# Patient Record
Sex: Female | Born: 1955
Health system: Southern US, Community
[De-identification: ages and names within clinical notes are randomized; demographics above are authoritative.]

## PROBLEM LIST (undated history)

## (undated) DIAGNOSIS — I10 Essential (primary) hypertension: Secondary | ICD-10-CM

## (undated) DIAGNOSIS — F410 Panic disorder [episodic paroxysmal anxiety] without agoraphobia: Secondary | ICD-10-CM

## (undated) DIAGNOSIS — F329 Major depressive disorder, single episode, unspecified: Secondary | ICD-10-CM

## (undated) DIAGNOSIS — I509 Heart failure, unspecified: Secondary | ICD-10-CM

## (undated) DIAGNOSIS — K219 Gastro-esophageal reflux disease without esophagitis: Secondary | ICD-10-CM

## (undated) DIAGNOSIS — T4145XA Adverse effect of unspecified anesthetic, initial encounter: Secondary | ICD-10-CM

## (undated) DIAGNOSIS — E119 Type 2 diabetes mellitus without complications: Secondary | ICD-10-CM

## (undated) DIAGNOSIS — R569 Unspecified convulsions: Secondary | ICD-10-CM

## (undated) DIAGNOSIS — E7800 Pure hypercholesterolemia, unspecified: Secondary | ICD-10-CM

## (undated) DIAGNOSIS — Z9289 Personal history of other medical treatment: Secondary | ICD-10-CM

## (undated) DIAGNOSIS — F419 Anxiety disorder, unspecified: Secondary | ICD-10-CM

## (undated) DIAGNOSIS — I2510 Atherosclerotic heart disease of native coronary artery without angina pectoris: Secondary | ICD-10-CM

## (undated) DIAGNOSIS — D649 Anemia, unspecified: Secondary | ICD-10-CM

## (undated) DIAGNOSIS — I739 Peripheral vascular disease, unspecified: Secondary | ICD-10-CM

## (undated) DIAGNOSIS — G629 Polyneuropathy, unspecified: Secondary | ICD-10-CM

## (undated) DIAGNOSIS — T8859XA Other complications of anesthesia, initial encounter: Secondary | ICD-10-CM

## (undated) DIAGNOSIS — F411 Generalized anxiety disorder: Secondary | ICD-10-CM

## (undated) DIAGNOSIS — M1990 Unspecified osteoarthritis, unspecified site: Secondary | ICD-10-CM

## (undated) HISTORY — DX: Peripheral vascular disease, unspecified: I73.9

## (undated) HISTORY — DX: Panic disorder (episodic paroxysmal anxiety): F41.1

## (undated) HISTORY — DX: Atherosclerotic heart disease of native coronary artery without angina pectoris: I25.10

## (undated) HISTORY — DX: Panic disorder (episodic paroxysmal anxiety): F41.0

## (undated) HISTORY — PX: COLONOSCOPY: SHX174

## (undated) HISTORY — PX: TONSILLECTOMY: SUR1361

## (undated) HISTORY — PX: CAROTID ENDARTERECTOMY: SUR193

## (undated) HISTORY — PX: TOE AMPUTATION: SHX809

## (undated) HISTORY — PX: CHOLECYSTECTOMY: SHX55

## (undated) HISTORY — PX: DILATION AND CURETTAGE OF UTERUS: SHX78

## (undated) MED FILL — Ferumoxytol Inj 510 MG/17ML (30 MG/ML) (Elemental Fe): INTRAVENOUS | Qty: 17 | Status: AC

---

## 2001-12-21 ENCOUNTER — Other Ambulatory Visit: Admission: RE | Admit: 2001-12-21 | Discharge: 2001-12-21 | Payer: Self-pay | Admitting: Obstetrics and Gynecology

## 2002-04-05 ENCOUNTER — Encounter: Payer: Self-pay | Admitting: Orthopedic Surgery

## 2002-04-05 ENCOUNTER — Inpatient Hospital Stay (HOSPITAL_COMMUNITY): Admission: EM | Admit: 2002-04-05 | Discharge: 2002-04-09 | Payer: Self-pay | Admitting: Emergency Medicine

## 2003-04-23 ENCOUNTER — Emergency Department (HOSPITAL_COMMUNITY): Admission: EM | Admit: 2003-04-23 | Discharge: 2003-04-23 | Payer: Self-pay | Admitting: Emergency Medicine

## 2003-08-25 ENCOUNTER — Emergency Department (HOSPITAL_COMMUNITY): Admission: EM | Admit: 2003-08-25 | Discharge: 2003-08-25 | Payer: Self-pay | Admitting: Podiatry

## 2003-09-13 ENCOUNTER — Emergency Department (HOSPITAL_COMMUNITY): Admission: EM | Admit: 2003-09-13 | Discharge: 2003-09-13 | Payer: Self-pay | Admitting: Family Medicine

## 2005-02-26 ENCOUNTER — Ambulatory Visit (HOSPITAL_COMMUNITY): Admission: RE | Admit: 2005-02-26 | Discharge: 2005-02-26 | Payer: Self-pay | Admitting: Orthopedic Surgery

## 2006-03-17 HISTORY — PX: BELOW KNEE LEG AMPUTATION: SUR23

## 2006-03-30 ENCOUNTER — Encounter: Payer: Self-pay | Admitting: Vascular Surgery

## 2006-03-30 ENCOUNTER — Ambulatory Visit: Payer: Self-pay | Admitting: Cardiology

## 2006-03-30 ENCOUNTER — Inpatient Hospital Stay (HOSPITAL_COMMUNITY): Admission: EM | Admit: 2006-03-30 | Discharge: 2006-04-07 | Payer: Self-pay | Admitting: Emergency Medicine

## 2006-03-31 ENCOUNTER — Encounter (INDEPENDENT_AMBULATORY_CARE_PROVIDER_SITE_OTHER): Payer: Self-pay | Admitting: Cardiology

## 2006-04-01 ENCOUNTER — Encounter (INDEPENDENT_AMBULATORY_CARE_PROVIDER_SITE_OTHER): Payer: Self-pay | Admitting: *Deleted

## 2006-04-03 ENCOUNTER — Ambulatory Visit: Payer: Self-pay | Admitting: Infectious Diseases

## 2006-04-03 ENCOUNTER — Ambulatory Visit: Payer: Self-pay | Admitting: Physical Medicine & Rehabilitation

## 2006-04-06 ENCOUNTER — Encounter: Payer: Self-pay | Admitting: Internal Medicine

## 2006-04-07 ENCOUNTER — Inpatient Hospital Stay (HOSPITAL_COMMUNITY)
Admission: RE | Admit: 2006-04-07 | Discharge: 2006-04-15 | Payer: Self-pay | Admitting: Physical Medicine & Rehabilitation

## 2006-05-01 ENCOUNTER — Ambulatory Visit: Payer: Self-pay | Admitting: Cardiovascular Disease

## 2006-05-01 LAB — CONVERTED CEMR LAB
Basophils Relative: 0.6 % (ref 0.0–1.0)
Creatinine, Ser: 0.9 mg/dL (ref 0.4–1.2)
Eosinophils Absolute: 0.7 10*3/uL — ABNORMAL HIGH (ref 0.0–0.6)
Eosinophils Relative: 9.5 % — ABNORMAL HIGH (ref 0.0–5.0)
GFR calc Af Amer: 86 mL/min
GFR calc non Af Amer: 71 mL/min
HCT: 34 % — ABNORMAL LOW (ref 36.0–46.0)
Hemoglobin: 11.6 g/dL — ABNORMAL LOW (ref 12.0–15.0)
Lymphocytes Relative: 25 % (ref 12.0–46.0)
MCHC: 34 g/dL (ref 30.0–36.0)
Monocytes Absolute: 0.5 10*3/uL (ref 0.2–0.7)
Monocytes Relative: 6.3 % (ref 3.0–11.0)
Neutrophils Relative %: 58.6 % (ref 43.0–77.0)
Sodium: 139 meq/L (ref 135–145)

## 2006-05-11 ENCOUNTER — Ambulatory Visit: Payer: Self-pay

## 2006-05-14 ENCOUNTER — Ambulatory Visit: Payer: Self-pay | Admitting: Cardiovascular Disease

## 2006-05-19 ENCOUNTER — Encounter
Admission: RE | Admit: 2006-05-19 | Discharge: 2006-08-17 | Payer: Self-pay | Admitting: Physical Medicine & Rehabilitation

## 2006-05-19 ENCOUNTER — Ambulatory Visit: Payer: Self-pay | Admitting: Physical Medicine & Rehabilitation

## 2006-05-20 ENCOUNTER — Encounter
Admission: RE | Admit: 2006-05-20 | Discharge: 2006-08-18 | Payer: Self-pay | Admitting: Physical Medicine & Rehabilitation

## 2006-06-24 ENCOUNTER — Ambulatory Visit: Payer: Self-pay | Admitting: Cardiovascular Disease

## 2006-08-18 ENCOUNTER — Encounter: Admission: RE | Admit: 2006-08-18 | Discharge: 2006-11-16 | Payer: Self-pay | Admitting: Orthopedic Surgery

## 2006-11-12 ENCOUNTER — Encounter: Admission: RE | Admit: 2006-11-12 | Discharge: 2006-11-12 | Payer: Self-pay | Admitting: Family Medicine

## 2007-01-07 ENCOUNTER — Encounter
Admission: RE | Admit: 2007-01-07 | Discharge: 2007-01-08 | Payer: Self-pay | Admitting: Physical Medicine & Rehabilitation

## 2007-01-07 ENCOUNTER — Ambulatory Visit: Payer: Self-pay | Admitting: Physical Medicine & Rehabilitation

## 2007-03-24 ENCOUNTER — Ambulatory Visit: Payer: Self-pay | Admitting: Cardiovascular Disease

## 2007-03-24 LAB — CONVERTED CEMR LAB
BUN: 23 mg/dL (ref 6–23)
Basophils Absolute: 0 10*3/uL (ref 0.0–0.1)
Basophils Relative: 0.4 % (ref 0.0–1.0)
CO2: 27 meq/L (ref 19–32)
GFR calc non Af Amer: 46 mL/min
Glucose, Bld: 103 mg/dL — ABNORMAL HIGH (ref 70–99)
HCT: 26.8 % — ABNORMAL LOW (ref 36.0–46.0)
Hemoglobin: 9.2 g/dL — ABNORMAL LOW (ref 12.0–15.0)
INR: 0.9 (ref 0.8–1.0)
MCV: 89.6 fL (ref 78.0–100.0)
Monocytes Relative: 6.2 % (ref 3.0–11.0)
Platelets: 348 10*3/uL (ref 150–400)
Prothrombin Time: 11.5 s (ref 10.9–13.3)
Sodium: 140 meq/L (ref 135–145)
WBC: 8.3 10*3/uL (ref 4.5–10.5)
aPTT: 26.7 s (ref 21.7–29.8)

## 2007-03-30 ENCOUNTER — Inpatient Hospital Stay (HOSPITAL_BASED_OUTPATIENT_CLINIC_OR_DEPARTMENT_OTHER): Admission: RE | Admit: 2007-03-30 | Discharge: 2007-03-30 | Payer: Self-pay | Admitting: Cardiovascular Disease

## 2007-03-30 ENCOUNTER — Ambulatory Visit: Payer: Self-pay | Admitting: Cardiovascular Disease

## 2007-04-14 ENCOUNTER — Ambulatory Visit: Payer: Self-pay | Admitting: Cardiovascular Disease

## 2007-04-30 ENCOUNTER — Encounter
Admission: RE | Admit: 2007-04-30 | Discharge: 2007-05-03 | Payer: Self-pay | Admitting: Physical Medicine & Rehabilitation

## 2007-04-30 ENCOUNTER — Ambulatory Visit: Payer: Self-pay | Admitting: Physical Medicine & Rehabilitation

## 2007-07-13 ENCOUNTER — Ambulatory Visit: Payer: Self-pay | Admitting: Cardiovascular Disease

## 2007-07-22 ENCOUNTER — Ambulatory Visit: Payer: Self-pay

## 2007-07-28 ENCOUNTER — Ambulatory Visit: Payer: Self-pay | Admitting: Vascular Surgery

## 2007-08-26 ENCOUNTER — Encounter: Payer: Self-pay | Admitting: Vascular Surgery

## 2007-08-26 ENCOUNTER — Ambulatory Visit: Payer: Self-pay | Admitting: Vascular Surgery

## 2007-08-26 ENCOUNTER — Inpatient Hospital Stay (HOSPITAL_COMMUNITY): Admission: RE | Admit: 2007-08-26 | Discharge: 2007-08-27 | Payer: Self-pay | Admitting: Vascular Surgery

## 2007-09-15 ENCOUNTER — Ambulatory Visit: Payer: Self-pay | Admitting: Vascular Surgery

## 2007-09-28 ENCOUNTER — Encounter
Admission: RE | Admit: 2007-09-28 | Discharge: 2007-09-29 | Payer: Self-pay | Admitting: Physical Medicine & Rehabilitation

## 2007-09-29 ENCOUNTER — Ambulatory Visit: Payer: Self-pay | Admitting: Vascular Surgery

## 2007-09-29 ENCOUNTER — Ambulatory Visit: Payer: Self-pay | Admitting: Physical Medicine & Rehabilitation

## 2007-10-05 ENCOUNTER — Ambulatory Visit: Payer: Self-pay | Admitting: Cardiovascular Disease

## 2007-10-05 ENCOUNTER — Inpatient Hospital Stay (HOSPITAL_COMMUNITY): Admission: AD | Admit: 2007-10-05 | Discharge: 2007-10-09 | Payer: Self-pay | Admitting: Internal Medicine

## 2007-10-06 ENCOUNTER — Encounter (INDEPENDENT_AMBULATORY_CARE_PROVIDER_SITE_OTHER): Payer: Self-pay | Admitting: Internal Medicine

## 2007-10-08 ENCOUNTER — Encounter: Payer: Self-pay | Admitting: Surgery

## 2007-10-13 ENCOUNTER — Ambulatory Visit: Payer: Self-pay | Admitting: Vascular Surgery

## 2007-10-20 ENCOUNTER — Ambulatory Visit: Payer: Self-pay | Admitting: Vascular Surgery

## 2007-11-01 ENCOUNTER — Emergency Department (HOSPITAL_BASED_OUTPATIENT_CLINIC_OR_DEPARTMENT_OTHER): Admission: EM | Admit: 2007-11-01 | Discharge: 2007-11-01 | Payer: Self-pay | Admitting: Emergency Medicine

## 2007-11-03 ENCOUNTER — Ambulatory Visit: Payer: Self-pay | Admitting: Vascular Surgery

## 2007-11-10 ENCOUNTER — Ambulatory Visit: Payer: Self-pay | Admitting: Cardiovascular Disease

## 2007-11-16 ENCOUNTER — Ambulatory Visit: Payer: Self-pay | Admitting: Cardiothoracic Surgery

## 2007-11-17 ENCOUNTER — Encounter: Admission: RE | Admit: 2007-11-17 | Discharge: 2007-11-17 | Payer: Self-pay | Admitting: Family Medicine

## 2007-11-29 ENCOUNTER — Encounter: Payer: Self-pay | Admitting: Cardiothoracic Surgery

## 2007-11-29 ENCOUNTER — Ambulatory Visit (HOSPITAL_COMMUNITY): Admission: RE | Admit: 2007-11-29 | Discharge: 2007-11-29 | Payer: Self-pay | Admitting: Cardiothoracic Surgery

## 2007-12-01 ENCOUNTER — Inpatient Hospital Stay (HOSPITAL_COMMUNITY): Admission: RE | Admit: 2007-12-01 | Discharge: 2007-12-09 | Payer: Self-pay | Admitting: Cardiothoracic Surgery

## 2007-12-01 ENCOUNTER — Ambulatory Visit: Payer: Self-pay | Admitting: Cardiology

## 2007-12-01 ENCOUNTER — Ambulatory Visit: Payer: Self-pay | Admitting: Cardiothoracic Surgery

## 2007-12-01 HISTORY — PX: CORONARY ARTERY BYPASS GRAFT: SHX141

## 2007-12-15 ENCOUNTER — Ambulatory Visit: Payer: Self-pay | Admitting: Cardiothoracic Surgery

## 2007-12-15 ENCOUNTER — Ambulatory Visit: Payer: Self-pay | Admitting: Vascular Surgery

## 2007-12-31 ENCOUNTER — Encounter: Admission: RE | Admit: 2007-12-31 | Discharge: 2007-12-31 | Payer: Self-pay | Admitting: Cardiothoracic Surgery

## 2007-12-31 ENCOUNTER — Ambulatory Visit: Payer: Self-pay | Admitting: Cardiothoracic Surgery

## 2008-01-06 ENCOUNTER — Ambulatory Visit: Payer: Self-pay | Admitting: Cardiovascular Disease

## 2008-01-14 ENCOUNTER — Ambulatory Visit: Payer: Self-pay | Admitting: Cardiothoracic Surgery

## 2008-01-18 ENCOUNTER — Encounter
Admission: RE | Admit: 2008-01-18 | Discharge: 2008-01-19 | Payer: Self-pay | Admitting: Physical Medicine & Rehabilitation

## 2008-01-19 ENCOUNTER — Ambulatory Visit: Payer: Self-pay | Admitting: Physical Medicine & Rehabilitation

## 2008-02-03 ENCOUNTER — Encounter (HOSPITAL_COMMUNITY): Admission: RE | Admit: 2008-02-03 | Discharge: 2008-03-03 | Payer: Self-pay | Admitting: Cardiovascular Disease

## 2008-02-25 ENCOUNTER — Ambulatory Visit: Payer: Self-pay | Admitting: Cardiothoracic Surgery

## 2008-03-01 ENCOUNTER — Ambulatory Visit: Payer: Self-pay | Admitting: Vascular Surgery

## 2008-03-14 DIAGNOSIS — I959 Hypotension, unspecified: Secondary | ICD-10-CM

## 2008-03-14 DIAGNOSIS — E118 Type 2 diabetes mellitus with unspecified complications: Secondary | ICD-10-CM

## 2008-03-14 DIAGNOSIS — Z794 Long term (current) use of insulin: Secondary | ICD-10-CM

## 2008-03-14 DIAGNOSIS — I10 Essential (primary) hypertension: Secondary | ICD-10-CM

## 2008-03-14 DIAGNOSIS — D638 Anemia in other chronic diseases classified elsewhere: Secondary | ICD-10-CM

## 2008-03-14 DIAGNOSIS — Z8669 Personal history of other diseases of the nervous system and sense organs: Secondary | ICD-10-CM

## 2008-03-14 DIAGNOSIS — E1141 Type 2 diabetes mellitus with diabetic mononeuropathy: Secondary | ICD-10-CM | POA: Insufficient documentation

## 2008-03-14 DIAGNOSIS — R0989 Other specified symptoms and signs involving the circulatory and respiratory systems: Secondary | ICD-10-CM | POA: Insufficient documentation

## 2008-03-14 DIAGNOSIS — E785 Hyperlipidemia, unspecified: Secondary | ICD-10-CM

## 2008-03-14 DIAGNOSIS — E1165 Type 2 diabetes mellitus with hyperglycemia: Secondary | ICD-10-CM | POA: Insufficient documentation

## 2008-03-14 DIAGNOSIS — I739 Peripheral vascular disease, unspecified: Secondary | ICD-10-CM

## 2008-03-14 DIAGNOSIS — D649 Anemia, unspecified: Secondary | ICD-10-CM

## 2008-03-14 DIAGNOSIS — Z9889 Other specified postprocedural states: Secondary | ICD-10-CM

## 2008-03-14 DIAGNOSIS — I6529 Occlusion and stenosis of unspecified carotid artery: Secondary | ICD-10-CM | POA: Insufficient documentation

## 2008-03-14 DIAGNOSIS — F329 Major depressive disorder, single episode, unspecified: Secondary | ICD-10-CM | POA: Insufficient documentation

## 2008-03-14 DIAGNOSIS — I25810 Atherosclerosis of coronary artery bypass graft(s) without angina pectoris: Secondary | ICD-10-CM | POA: Insufficient documentation

## 2008-03-14 DIAGNOSIS — I779 Disorder of arteries and arterioles, unspecified: Secondary | ICD-10-CM | POA: Insufficient documentation

## 2008-03-14 DIAGNOSIS — I5032 Chronic diastolic (congestive) heart failure: Secondary | ICD-10-CM

## 2008-03-14 DIAGNOSIS — F341 Dysthymic disorder: Secondary | ICD-10-CM

## 2008-03-14 DIAGNOSIS — Z9089 Acquired absence of other organs: Secondary | ICD-10-CM | POA: Insufficient documentation

## 2008-03-14 HISTORY — DX: Major depressive disorder, single episode, unspecified: F32.9

## 2008-03-14 HISTORY — DX: Anemia, unspecified: D64.9

## 2008-03-14 HISTORY — DX: Other specified postprocedural states: Z98.890

## 2008-03-14 HISTORY — DX: Atherosclerosis of coronary artery bypass graft(s) without angina pectoris: I25.810

## 2008-03-14 HISTORY — DX: Type 2 diabetes mellitus with diabetic mononeuropathy: E11.41

## 2008-03-14 HISTORY — DX: Essential (primary) hypertension: I10

## 2008-03-14 HISTORY — DX: Hyperlipidemia, unspecified: E78.5

## 2008-03-14 HISTORY — DX: Long term (current) use of insulin: Z79.4

## 2008-04-27 ENCOUNTER — Encounter
Admission: RE | Admit: 2008-04-27 | Discharge: 2008-07-26 | Payer: Self-pay | Admitting: Physical Medicine & Rehabilitation

## 2008-04-28 ENCOUNTER — Ambulatory Visit: Payer: Self-pay | Admitting: Physical Medicine & Rehabilitation

## 2008-05-31 ENCOUNTER — Ambulatory Visit: Payer: Self-pay | Admitting: Physical Medicine & Rehabilitation

## 2008-07-12 ENCOUNTER — Ambulatory Visit: Payer: Self-pay | Admitting: Physical Medicine & Rehabilitation

## 2009-03-12 ENCOUNTER — Ambulatory Visit: Payer: Self-pay | Admitting: Vascular Surgery

## 2009-03-28 ENCOUNTER — Ambulatory Visit: Payer: Self-pay | Admitting: Vascular Surgery

## 2009-03-28 ENCOUNTER — Encounter: Payer: Self-pay | Admitting: Cardiovascular Disease

## 2009-05-09 ENCOUNTER — Emergency Department (HOSPITAL_COMMUNITY): Admission: EM | Admit: 2009-05-09 | Discharge: 2009-05-10 | Payer: Self-pay | Admitting: Emergency Medicine

## 2009-05-12 ENCOUNTER — Emergency Department (HOSPITAL_COMMUNITY): Admission: EM | Admit: 2009-05-12 | Discharge: 2009-05-12 | Payer: Self-pay | Admitting: Emergency Medicine

## 2009-05-14 ENCOUNTER — Inpatient Hospital Stay (HOSPITAL_COMMUNITY): Admission: EM | Admit: 2009-05-14 | Discharge: 2009-05-17 | Payer: Self-pay | Admitting: Emergency Medicine

## 2009-05-14 ENCOUNTER — Encounter (INDEPENDENT_AMBULATORY_CARE_PROVIDER_SITE_OTHER): Payer: Self-pay | Admitting: Internal Medicine

## 2009-06-06 ENCOUNTER — Ambulatory Visit: Payer: Self-pay | Admitting: Vascular Surgery

## 2009-06-08 ENCOUNTER — Encounter: Admission: RE | Admit: 2009-06-08 | Discharge: 2009-06-08 | Payer: Self-pay | Admitting: Vascular Surgery

## 2009-06-13 ENCOUNTER — Ambulatory Visit: Payer: Self-pay | Admitting: Vascular Surgery

## 2009-06-14 ENCOUNTER — Ambulatory Visit: Payer: Self-pay | Admitting: Vascular Surgery

## 2009-06-14 ENCOUNTER — Inpatient Hospital Stay (HOSPITAL_COMMUNITY): Admission: RE | Admit: 2009-06-14 | Discharge: 2009-06-20 | Payer: Self-pay | Admitting: Vascular Surgery

## 2009-06-19 ENCOUNTER — Ambulatory Visit: Payer: Self-pay | Admitting: Surgery

## 2009-07-11 ENCOUNTER — Ambulatory Visit: Payer: Self-pay | Admitting: Vascular Surgery

## 2009-07-25 ENCOUNTER — Ambulatory Visit: Payer: Self-pay | Admitting: Vascular Surgery

## 2009-08-08 ENCOUNTER — Ambulatory Visit: Payer: Self-pay | Admitting: Vascular Surgery

## 2009-08-29 ENCOUNTER — Ambulatory Visit: Payer: Self-pay | Admitting: Vascular Surgery

## 2009-10-17 ENCOUNTER — Ambulatory Visit: Payer: Self-pay | Admitting: Vascular Surgery

## 2009-10-31 ENCOUNTER — Ambulatory Visit: Payer: Self-pay | Admitting: Vascular Surgery

## 2009-12-13 ENCOUNTER — Ambulatory Visit: Payer: Self-pay | Admitting: Vascular Surgery

## 2009-12-14 ENCOUNTER — Ambulatory Visit: Payer: Self-pay | Admitting: Vascular Surgery

## 2009-12-14 ENCOUNTER — Ambulatory Visit (HOSPITAL_COMMUNITY): Admission: RE | Admit: 2009-12-14 | Discharge: 2009-12-14 | Payer: Self-pay | Admitting: Vascular Surgery

## 2010-01-03 ENCOUNTER — Ambulatory Visit: Payer: Self-pay | Admitting: Vascular Surgery

## 2010-01-05 ENCOUNTER — Emergency Department (HOSPITAL_BASED_OUTPATIENT_CLINIC_OR_DEPARTMENT_OTHER): Admission: EM | Admit: 2010-01-05 | Discharge: 2010-01-05 | Payer: Self-pay | Admitting: Emergency Medicine

## 2010-01-05 ENCOUNTER — Ambulatory Visit: Payer: Self-pay | Admitting: Diagnostic Radiology

## 2010-02-14 ENCOUNTER — Ambulatory Visit: Payer: Self-pay | Admitting: Vascular Surgery

## 2010-04-11 ENCOUNTER — Ambulatory Visit: Admit: 2010-04-11 | Payer: Self-pay | Admitting: Vascular Surgery

## 2010-04-11 ENCOUNTER — Ambulatory Visit
Admission: RE | Admit: 2010-04-11 | Discharge: 2010-04-11 | Payer: Self-pay | Source: Home / Self Care | Attending: Vascular Surgery | Admitting: Vascular Surgery

## 2010-04-15 NOTE — Procedures (Unsigned)
CAROTID DUPLEX EXAM  INDICATION:  Follow up right carotid endarterectomy.  HISTORY: Diabetes:  Yes. Cardiac:  No. Hypertension:  Yes. Smoking:  No. Previous Surgery:  Right BKA and right carotid endarterectomy. CV History:  Currently asymptomatic. Amaurosis Fugax No, Paresthesias No, Hemiparesis No.                                      RIGHT             LEFT Brachial systolic pressure:         172 Brachial Doppler waveforms:         Normal Vertebral direction of flow:        Antegrade DUPLEX VELOCITIES (cm/sec) CCA peak systolic                   74 ECA peak systolic                   [id number] ICA peak systolic                   87 ICA end diastolic                   33 PLAQUE MORPHOLOGY:                  None PLAQUE AMOUNT:                      None PLAQUE LOCATION:                    None  IMPRESSION: 1. Patent right carotid endarterectomy site with no evidence of     restenosis of the internal carotid artery. 2. Known left internal carotid artery occlusion. 3. Right vertebral artery is antegrade.  ___________________________________________ Jessy Oto. Fields, MD  EM/MEDQ  D:  04/11/2010  T:  04/11/2010  Job:  [id number]

## 2010-04-15 NOTE — Procedures (Unsigned)
BYPASS GRAFT EVALUATION  INDICATION:  Follow up left SFA stent.  HISTORY: Diabetes:  Yes. Cardiac:  No. Hypertension:  Yes. Smoking:  No. Previous Surgery:  Right below-knee amputation.  SINGLE LEVEL ARTERIAL EXAM                              RIGHT              LEFT Brachial:                    172                Not taken Anterior tibial:                                150 Posterior tibial:                               129 Peroneal: Ankle/brachial index:        BKA                0.87  PREVIOUS ABI:  Date: 01/03/2010  RIGHT:  BKA  LEFT:  0.98  LOWER EXTREMITY BYPASS GRAFT DUPLEX EXAM:  DUPLEX:  Patent left SFA stent with elevated velocities in comparison with the previous study.  IMPRESSION: 1. Decreased left ankle brachial indices. 2. Patent superficial femoral artery stent with increased velocities     noted throughout the stent.  ___________________________________________ Jessy Oto. Fields, MD  EM/MEDQ  D:  04/11/2010  T:  04/11/2010  Job:  [id number]

## 2010-04-16 NOTE — Letter (Signed)
Summary: [facility]   [facility]   Imported By: Sallee Provencal 04/11/2009 14:20:10  _____________________________________________________________________  External Attachment:    Type:   Image     Comment:   External Document

## 2010-05-06 ENCOUNTER — Emergency Department (HOSPITAL_BASED_OUTPATIENT_CLINIC_OR_DEPARTMENT_OTHER): Payer: Medicare Other

## 2010-05-06 ENCOUNTER — Emergency Department (HOSPITAL_BASED_OUTPATIENT_CLINIC_OR_DEPARTMENT_OTHER)
Admission: EM | Admit: 2010-05-06 | Discharge: 2010-05-06 | Disposition: A | Discharge status: Home / Self Care | Payer: Medicare Other | Attending: Emergency Medicine | Admitting: Emergency Medicine

## 2010-05-06 DIAGNOSIS — M545 Low back pain, unspecified: Secondary | ICD-10-CM

## 2010-05-06 DIAGNOSIS — E119 Type 2 diabetes mellitus without complications: Secondary | ICD-10-CM | POA: Insufficient documentation

## 2010-05-06 DIAGNOSIS — I2510 Atherosclerotic heart disease of native coronary artery without angina pectoris: Secondary | ICD-10-CM | POA: Insufficient documentation

## 2010-05-06 DIAGNOSIS — E7800 Pure hypercholesterolemia, unspecified: Secondary | ICD-10-CM | POA: Insufficient documentation

## 2010-05-06 DIAGNOSIS — I10 Essential (primary) hypertension: Secondary | ICD-10-CM | POA: Insufficient documentation

## 2010-05-06 DIAGNOSIS — Y92009 Unspecified place in unspecified non-institutional (private) residence as the place of occurrence of the external cause: Secondary | ICD-10-CM | POA: Insufficient documentation

## 2010-05-06 DIAGNOSIS — Z794 Long term (current) use of insulin: Secondary | ICD-10-CM | POA: Insufficient documentation

## 2010-05-06 DIAGNOSIS — Z79899 Other long term (current) drug therapy: Secondary | ICD-10-CM | POA: Insufficient documentation

## 2010-05-06 DIAGNOSIS — W010XXA Fall on same level from slipping, tripping and stumbling without subsequent striking against object, initial encounter: Secondary | ICD-10-CM

## 2010-05-06 DIAGNOSIS — S335XXA Sprain of ligaments of lumbar spine, initial encounter: Secondary | ICD-10-CM | POA: Insufficient documentation

## 2010-05-22 ENCOUNTER — Ambulatory Visit: Payer: Medicare Other | Admitting: Internal Medicine

## 2010-05-30 LAB — POCT I-STAT, CHEM 8
Calcium, Ion: 1.22 mmol/L (ref 1.12–1.32)
Glucose, Bld: 136 mg/dL — ABNORMAL HIGH (ref 70–99)
HCT: 30 % — ABNORMAL LOW (ref 36.0–46.0)
Hemoglobin: 10.2 g/dL — ABNORMAL LOW (ref 12.0–15.0)
Potassium: 4.4 mEq/L (ref 3.5–5.1)

## 2010-06-05 LAB — CROSSMATCH
ABO/RH(D): A POS
Antibody Screen: NEGATIVE

## 2010-06-05 LAB — BASIC METABOLIC PANEL
BUN: 11 mg/dL (ref 6–23)
BUN: 12 mg/dL (ref 6–23)
BUN: 7 mg/dL (ref 6–23)
CO2: 26 mEq/L (ref 19–32)
CO2: 28 mEq/L (ref 19–32)
CO2: 28 mEq/L (ref 19–32)
Calcium: 8.9 mg/dL (ref 8.4–10.5)
Chloride: 100 mEq/L (ref 96–112)
Chloride: 104 mEq/L (ref 96–112)
Creatinine, Ser: 1.18 mg/dL (ref 0.4–1.2)
GFR calc Af Amer: 49 mL/min — ABNORMAL LOW (ref >60)
GFR calc non Af Amer: 41 mL/min — ABNORMAL LOW (ref >60)
GFR calc non Af Amer: 46 mL/min — ABNORMAL LOW (ref >60)
Glucose, Bld: 152 mg/dL — ABNORMAL HIGH (ref 70–99)
Glucose, Bld: 56 mg/dL — ABNORMAL LOW (ref 70–99)
Glucose, Bld: 84 mg/dL (ref 70–99)
Potassium: 3.6 mEq/L (ref 3.5–5.1)
Potassium: 4.4 mEq/L (ref 3.5–5.1)
Potassium: 4.4 mEq/L (ref 3.5–5.1)
Sodium: 138 mEq/L (ref 135–145)

## 2010-06-05 LAB — GLUCOSE, CAPILLARY
Glucose-Capillary: 100 mg/dL — ABNORMAL HIGH (ref 70–99)
Glucose-Capillary: 141 mg/dL — ABNORMAL HIGH (ref 70–99)
Glucose-Capillary: 143 mg/dL — ABNORMAL HIGH (ref 70–99)
Glucose-Capillary: 157 mg/dL — ABNORMAL HIGH (ref 70–99)
Glucose-Capillary: 171 mg/dL — ABNORMAL HIGH (ref 70–99)
Glucose-Capillary: 241 mg/dL — ABNORMAL HIGH (ref 70–99)
Glucose-Capillary: 77 mg/dL (ref 70–99)
Glucose-Capillary: 78 mg/dL (ref 70–99)
Glucose-Capillary: 88 mg/dL (ref 70–99)
Glucose-Capillary: 170 mg/dL — ABNORMAL HIGH (ref 70–99)
Glucose-Capillary: 188 mg/dL — ABNORMAL HIGH (ref 70–99)
Glucose-Capillary: 193 mg/dL — ABNORMAL HIGH (ref 70–99)

## 2010-06-05 LAB — URINE MICROSCOPIC-ADD ON

## 2010-06-05 LAB — WOUND CULTURE: Gram Stain: NONE SEEN

## 2010-06-05 LAB — CBC
HCT: 21.2 % — ABNORMAL LOW (ref 36.0–46.0)
HCT: 22.3 % — ABNORMAL LOW (ref 36.0–46.0)
HCT: 28.9 % — ABNORMAL LOW (ref 36.0–46.0)
HCT: 23.4 % — ABNORMAL LOW (ref 36.0–46.0)
Hemoglobin: 8.9 g/dL — ABNORMAL LOW (ref 12.0–15.0)
MCHC: 33.4 g/dL (ref 30.0–36.0)
MCHC: 33.5 g/dL (ref 30.0–36.0)
MCHC: 33.8 g/dL (ref 30.0–36.0)
MCHC: 33.8 g/dL (ref 30.0–36.0)
MCV: 84.3 fL (ref 78.0–100.0)
MCV: 85.4 fL (ref 78.0–100.0)
MCV: 85.7 fL (ref 78.0–100.0)
Platelets: 269 10*3/uL (ref 150–400)
Platelets: 271 10*3/uL (ref 150–400)
Platelets: 295 10*3/uL (ref 150–400)
Platelets: 334 10*3/uL (ref 150–400)
RBC: 3.16 MIL/uL — ABNORMAL LOW (ref 3.87–5.11)
RBC: 2.79 MIL/uL — ABNORMAL LOW (ref 3.87–5.11)
RDW: 20.8 % — ABNORMAL HIGH (ref 11.5–15.5)
RDW: 21.3 % — ABNORMAL HIGH (ref 11.5–15.5)
RDW: 21.4 % — ABNORMAL HIGH (ref 11.5–15.5)
WBC: 8.3 10*3/uL (ref 4.0–10.5)
WBC: 20.7 10*3/uL — ABNORMAL HIGH (ref 4.0–10.5)

## 2010-06-05 LAB — COMPREHENSIVE METABOLIC PANEL
ALT: 20 U/L (ref 0–35)
AST: 25 U/L (ref 0–37)
Albumin: 2.7 g/dL — ABNORMAL LOW (ref 3.5–5.2)
Alkaline Phosphatase: 83 U/L (ref 39–117)
Chloride: 99 mEq/L (ref 96–112)
GFR calc Af Amer: 57 mL/min — ABNORMAL LOW (ref >60)
Potassium: 3.8 mEq/L (ref 3.5–5.1)
Total Bilirubin: 1 mg/dL (ref 0.3–1.2)

## 2010-06-05 LAB — URINALYSIS, ROUTINE W REFLEX MICROSCOPIC
Glucose, UA: 100 mg/dL — AB
Ketones, ur: 40 mg/dL — AB
Leukocytes, UA: NEGATIVE
pH: 6 (ref 5.0–8.0)

## 2010-06-05 LAB — URINE CULTURE

## 2010-06-05 LAB — DIFFERENTIAL
Basophils Absolute: 0 10*3/uL (ref 0.0–0.1)
Basophils Relative: 0 % (ref 0–1)
Eosinophils Absolute: 0 10*3/uL (ref 0.0–0.7)
Eosinophils Relative: 0 % (ref 0–5)
Monocytes Absolute: 1.8 10*3/uL — ABNORMAL HIGH (ref 0.1–1.0)

## 2010-06-05 LAB — HEMOGLOBIN AND HEMATOCRIT, BLOOD
HCT: 24.8 % — ABNORMAL LOW (ref 36.0–46.0)
Hemoglobin: 8.4 g/dL — ABNORMAL LOW (ref 12.0–15.0)

## 2010-06-05 LAB — PREGNANCY, URINE: Preg Test, Ur: NEGATIVE

## 2010-06-05 LAB — ANAEROBIC CULTURE

## 2010-06-05 LAB — POCT CARDIAC MARKERS
CKMB, poc: 1.2 ng/mL (ref 1.0–8.0)
Myoglobin, poc: >500 ng/mL (ref 12–200)

## 2010-06-05 LAB — CULTURE, BLOOD (ROUTINE X 2): Culture: NO GROWTH

## 2010-06-05 LAB — PROTIME-INR: INR: 1.14 (ref 0.00–1.49)

## 2010-06-09 LAB — GLUCOSE, CAPILLARY
Glucose-Capillary: 108 mg/dL — ABNORMAL HIGH (ref 70–99)
Glucose-Capillary: 142 mg/dL — ABNORMAL HIGH (ref 70–99)
Glucose-Capillary: 156 mg/dL — ABNORMAL HIGH (ref 70–99)
Glucose-Capillary: 162 mg/dL — ABNORMAL HIGH (ref 70–99)
Glucose-Capillary: 171 mg/dL — ABNORMAL HIGH (ref 70–99)
Glucose-Capillary: 172 mg/dL — ABNORMAL HIGH (ref 70–99)
Glucose-Capillary: 181 mg/dL — ABNORMAL HIGH (ref 70–99)
Glucose-Capillary: 241 mg/dL — ABNORMAL HIGH (ref 70–99)
Glucose-Capillary: 292 mg/dL — ABNORMAL HIGH (ref 70–99)
Glucose-Capillary: 91 mg/dL (ref 70–99)

## 2010-06-09 LAB — URINALYSIS, MICROSCOPIC ONLY
Ketones, ur: NEGATIVE mg/dL
Leukocytes, UA: NEGATIVE
Protein, ur: 30 mg/dL — AB
Urobilinogen, UA: 0.2 mg/dL (ref 0.0–1.0)

## 2010-06-09 LAB — CULTURE, BLOOD (ROUTINE X 2): Culture: NO GROWTH

## 2010-06-09 LAB — IRON AND TIBC: Iron: <10 ug/dL — ABNORMAL LOW (ref 42–135)

## 2010-06-09 LAB — CBC
HCT: 22.4 % — ABNORMAL LOW (ref 36.0–46.0)
HCT: 26.1 % — ABNORMAL LOW (ref 36.0–46.0)
Hemoglobin: 7.7 g/dL — ABNORMAL LOW (ref 12.0–15.0)
Hemoglobin: 7.8 g/dL — ABNORMAL LOW (ref 12.0–15.0)
Hemoglobin: 8.9 g/dL — ABNORMAL LOW (ref 12.0–15.0)
MCV: 84.1 fL (ref 78.0–100.0)
Platelets: 284 10*3/uL (ref 150–400)
RBC: 2.66 MIL/uL — ABNORMAL LOW (ref 3.87–5.11)
RBC: 2.74 MIL/uL — ABNORMAL LOW (ref 3.87–5.11)
RBC: 3.1 MIL/uL — ABNORMAL LOW (ref 3.87–5.11)
RDW: 17.5 % — ABNORMAL HIGH (ref 11.5–15.5)
WBC: 12.1 10*3/uL — ABNORMAL HIGH (ref 4.0–10.5)
WBC: 12.9 10*3/uL — ABNORMAL HIGH (ref 4.0–10.5)
WBC: 13.3 10*3/uL — ABNORMAL HIGH (ref 4.0–10.5)

## 2010-06-09 LAB — BLOOD GAS, ARTERIAL
Acid-base deficit: 4.9 mmol/L — ABNORMAL HIGH (ref 0.0–2.0)
O2 Saturation: 96.2 %
TCO2: 20.1 mmol/L (ref 0–100)
pCO2 arterial: 34.2 mmHg — ABNORMAL LOW (ref 35.0–45.0)
pO2, Arterial: 81.6 mmHg (ref 80.0–100.0)

## 2010-06-09 LAB — FERRITIN: Ferritin: 258 ng/mL (ref 10–291)

## 2010-06-09 LAB — COMPREHENSIVE METABOLIC PANEL
Alkaline Phosphatase: 173 U/L — ABNORMAL HIGH (ref 39–117)
BUN: 10 mg/dL (ref 6–23)
CO2: 21 mEq/L (ref 19–32)
Chloride: 105 mEq/L (ref 96–112)
Creatinine, Ser: 0.99 mg/dL (ref 0.4–1.2)
GFR calc non Af Amer: 59 mL/min — ABNORMAL LOW (ref >60)
Glucose, Bld: 112 mg/dL — ABNORMAL HIGH (ref 70–99)
Potassium: 4.4 mEq/L (ref 3.5–5.1)
Total Bilirubin: 0.5 mg/dL (ref 0.3–1.2)

## 2010-06-09 LAB — TYPE AND SCREEN
ABO/RH(D): A POS
Antibody Screen: NEGATIVE

## 2010-06-09 LAB — RETICULOCYTES: Retic Count, Absolute: 19.7 10*3/uL (ref 19.0–186.0)

## 2010-06-09 LAB — BASIC METABOLIC PANEL
Chloride: 102 mEq/L (ref 96–112)
GFR calc non Af Amer: 49 mL/min — ABNORMAL LOW (ref >60)
Potassium: 3.9 mEq/L (ref 3.5–5.1)
Sodium: 134 mEq/L — ABNORMAL LOW (ref 135–145)

## 2010-06-09 LAB — POCT I-STAT 4, (NA,K, GLUC, HGB,HCT)
HCT: 24 % — ABNORMAL LOW (ref 36.0–46.0)
Hemoglobin: 8.2 g/dL — ABNORMAL LOW (ref 12.0–15.0)
Potassium: 3.7 mEq/L (ref 3.5–5.1)

## 2010-06-10 ENCOUNTER — Emergency Department (INDEPENDENT_AMBULATORY_CARE_PROVIDER_SITE_OTHER): Payer: Medicare Other

## 2010-06-10 ENCOUNTER — Emergency Department (HOSPITAL_BASED_OUTPATIENT_CLINIC_OR_DEPARTMENT_OTHER)
Admission: EM | Admit: 2010-06-10 | Discharge: 2010-06-10 | Disposition: A | Discharge status: Home / Self Care | Payer: Medicare Other | Attending: Emergency Medicine | Admitting: Emergency Medicine

## 2010-06-10 ENCOUNTER — Emergency Department: Payer: Medicare Other | Admitting: Emergency Medicine

## 2010-06-10 DIAGNOSIS — Z79899 Other long term (current) drug therapy: Secondary | ICD-10-CM | POA: Insufficient documentation

## 2010-06-10 DIAGNOSIS — W1811XA Fall from or off toilet without subsequent striking against object, initial encounter: Secondary | ICD-10-CM

## 2010-06-10 DIAGNOSIS — M25519 Pain in unspecified shoulder: Secondary | ICD-10-CM | POA: Insufficient documentation

## 2010-06-10 DIAGNOSIS — M79609 Pain in unspecified limb: Secondary | ICD-10-CM

## 2010-06-10 DIAGNOSIS — E119 Type 2 diabetes mellitus without complications: Secondary | ICD-10-CM | POA: Insufficient documentation

## 2010-06-10 DIAGNOSIS — S63509A Unspecified sprain of unspecified wrist, initial encounter: Secondary | ICD-10-CM | POA: Insufficient documentation

## 2010-06-10 DIAGNOSIS — Z794 Long term (current) use of insulin: Secondary | ICD-10-CM | POA: Insufficient documentation

## 2010-06-10 DIAGNOSIS — W19XXXA Unspecified fall, initial encounter: Secondary | ICD-10-CM | POA: Insufficient documentation

## 2010-06-10 DIAGNOSIS — I2510 Atherosclerotic heart disease of native coronary artery without angina pectoris: Secondary | ICD-10-CM | POA: Insufficient documentation

## 2010-06-10 DIAGNOSIS — I10 Essential (primary) hypertension: Secondary | ICD-10-CM | POA: Insufficient documentation

## 2010-06-10 DIAGNOSIS — M25539 Pain in unspecified wrist: Secondary | ICD-10-CM

## 2010-06-10 DIAGNOSIS — E7800 Pure hypercholesterolemia, unspecified: Secondary | ICD-10-CM | POA: Insufficient documentation

## 2010-06-27 ENCOUNTER — Ambulatory Visit (INDEPENDENT_AMBULATORY_CARE_PROVIDER_SITE_OTHER): Payer: Medicare Other

## 2010-06-27 ENCOUNTER — Encounter (INDEPENDENT_AMBULATORY_CARE_PROVIDER_SITE_OTHER): Payer: Medicare Other

## 2010-06-27 DIAGNOSIS — L98499 Non-pressure chronic ulcer of skin of other sites with unspecified severity: Secondary | ICD-10-CM

## 2010-06-27 DIAGNOSIS — I739 Peripheral vascular disease, unspecified: Secondary | ICD-10-CM

## 2010-06-27 DIAGNOSIS — Z48812 Encounter for surgical aftercare following surgery on the circulatory system: Secondary | ICD-10-CM

## 2010-06-27 NOTE — H&P (Signed)
HISTORY AND PHYSICAL EXAMINATION  June 27, 2010  Re:  [name], [name]              DOB:  [birth year]  HISTORY OF PRESENT ILLNESS:  Patient is a 55 year old woman with known peripheral vascular disease and a right below-knee amputation and amputations of the second and third toe on the left foot.  She had SFA stents placed and popliteal stents placed on the left-hand side, and she had been doing well until a week ago when she had a sudden onset of pain and coolness in the leg.  She also developed a few days later an ulcer on the end of her fourth toe, which has now formed a large black scab and some redness of the toe and to the dorsum of the foot.  The patient states that the pain has gotten worse over the last several days.  She had ABIs done today which were 0.36 on the left, and they were 0.87 in January of this year.  She had monophasic waveforms noted throughout the left lower extremity arterial system with velocities of 375 in the left distal SFA, 166 in the distal popliteal, and 261 in the left profunda femoral artery.  [name] being admitted for angiogram with possible need for bypass.  PAST MEDICAL HISTORY:  Diabetes, hypertension, anemia, depression, elevated cholesterol, right below-knee amputation and right carotid endarterectomy.  Her medications are unchanged from her previous H and P of June 13, 2009.  FAMILY HISTORY AND SOCIAL HISTORY:  Reviewed from June 13, 2009 and are unchanged.  ALLERGIES:  She has no known drug allergies.  PHYSICAL EXAMINATION:  This is a well-developed, well-nourished woman in no acute distress.  Her heart rate was 78.  Her sats were 99%, and her respiratory rate was 12.  HEENT is grossly within normal limits.  Her lungs were clear without wheezes, rales, or rhonchi.  Heart:  Rate and rhythm is regular without murmur or rub.  Abdomen was soft and nontender.  Left lower extremity was warm and pink to the  toes.  She had erythema of the left fourth toe with a little streaking over the dorsum of the foot.  She had a black eschar over the tip of the fourth toe. She had a dopplerable DP and peroneal signal.  ASSESSMENT:  Probable occluded superficial femoral artery stents with increased pain and gangrene of the left fourth toe.  PLAN: 1. Bring the patient in for an angiogram on Tuesday and possible     bypass surgery. 2. She was given a prescription for Cipro 500 mg twice daily for 7     days.  She was also given a prescription for Percocet #30.  Wray Kearns, PA-C  Rosetta Posner, M.D. Electronically Signed  RR/MEDQ  D:  06/27/2010  T:  06/27/2010  Job:  [id number]

## 2010-07-02 ENCOUNTER — Ambulatory Visit (HOSPITAL_COMMUNITY)
Admission: RE | Admit: 2010-07-02 | Discharge: 2010-07-02 | Disposition: A | Discharge status: Home / Self Care | Payer: Medicare Other | Source: Ambulatory Visit | Attending: Surgery | Admitting: Surgery

## 2010-07-02 DIAGNOSIS — L97809 Non-pressure chronic ulcer of other part of unspecified lower leg with unspecified severity: Secondary | ICD-10-CM | POA: Insufficient documentation

## 2010-07-02 DIAGNOSIS — L98499 Non-pressure chronic ulcer of skin of other sites with unspecified severity: Secondary | ICD-10-CM

## 2010-07-02 DIAGNOSIS — E7800 Pure hypercholesterolemia, unspecified: Secondary | ICD-10-CM | POA: Insufficient documentation

## 2010-07-02 DIAGNOSIS — T82898A Other specified complication of vascular prosthetic devices, implants and grafts, initial encounter: Secondary | ICD-10-CM | POA: Insufficient documentation

## 2010-07-02 DIAGNOSIS — F3289 Other specified depressive episodes: Secondary | ICD-10-CM | POA: Insufficient documentation

## 2010-07-02 DIAGNOSIS — D649 Anemia, unspecified: Secondary | ICD-10-CM | POA: Insufficient documentation

## 2010-07-02 DIAGNOSIS — Z951 Presence of aortocoronary bypass graft: Secondary | ICD-10-CM | POA: Insufficient documentation

## 2010-07-02 DIAGNOSIS — E119 Type 2 diabetes mellitus without complications: Secondary | ICD-10-CM | POA: Insufficient documentation

## 2010-07-02 DIAGNOSIS — S88119A Complete traumatic amputation at level between knee and ankle, unspecified lower leg, initial encounter: Secondary | ICD-10-CM | POA: Insufficient documentation

## 2010-07-02 DIAGNOSIS — Y831 Surgical operation with implant of artificial internal device as the cause of abnormal reaction of the patient, or of later complication, without mention of misadventure at the time of the procedure: Secondary | ICD-10-CM | POA: Insufficient documentation

## 2010-07-02 DIAGNOSIS — E785 Hyperlipidemia, unspecified: Secondary | ICD-10-CM | POA: Insufficient documentation

## 2010-07-02 DIAGNOSIS — I10 Essential (primary) hypertension: Secondary | ICD-10-CM | POA: Insufficient documentation

## 2010-07-02 DIAGNOSIS — Z794 Long term (current) use of insulin: Secondary | ICD-10-CM | POA: Insufficient documentation

## 2010-07-02 DIAGNOSIS — F329 Major depressive disorder, single episode, unspecified: Secondary | ICD-10-CM | POA: Insufficient documentation

## 2010-07-02 DIAGNOSIS — I739 Peripheral vascular disease, unspecified: Secondary | ICD-10-CM

## 2010-07-02 LAB — POCT I-STAT, CHEM 8
BUN: 19 mg/dL (ref 6–23)
Calcium, Ion: 1.18 mmol/L (ref 1.12–1.32)
HCT: 24 % — ABNORMAL LOW (ref 36.0–46.0)
Sodium: 138 mEq/L (ref 135–145)
TCO2: 24 mmol/L (ref 0–100)

## 2010-07-02 LAB — GLUCOSE, CAPILLARY
Glucose-Capillary: 101 mg/dL — ABNORMAL HIGH (ref 70–99)
Glucose-Capillary: 147 mg/dL — ABNORMAL HIGH (ref 70–99)

## 2010-07-02 LAB — POCT ACTIVATED CLOTTING TIME: Activated Clotting Time: 169 seconds

## 2010-07-02 LAB — PREGNANCY, URINE: Preg Test, Ur: NEGATIVE

## 2010-07-04 NOTE — Procedures (Unsigned)
BYPASS GRAFT EVALUATION  INDICATION:  Left lower extremity stents.  HISTORY: Diabetes:  Yes. Cardiac:  No. Hypertension:  Yes. Smoking:  No. Previous Surgery:  Right below-knee amputation.  Angioplasty of left superficial femoral and popliteal stents with additional stent of the popliteal artery placed on 12/14/2009.  SINGLE LEVEL ARTERIAL EXAM                              RIGHT              LEFT Brachial:                    146                143 Anterior tibial:                                46 Posterior tibial:                               Not detected Peroneal:                                       53 Ankle/brachial index:                           0.36  PREVIOUS ABI:  Date:  04/11/2010  RIGHT:  LEFT:  0.87  LOWER EXTREMITY BYPASS GRAFT DUPLEX EXAM:  DUPLEX:  Monophasic Doppler waveforms noted throughout the left lower extremity arterial system with velocities of 375 cm/s in the left distal superficial femoral artery, 166 cm/s in the left distal popliteal artery, and 251 cm/s in the left proximal profunda femoral artery.  IMPRESSION: 1. Patent left superficial femoral and popliteal artery stents with     elevated velocities noted, as described above. 2. The left ankle brachial index and Doppler waveforms suggest     moderate to severely decreased perfusion of the left lower     extremity.  Significant decrease in the left ankle brachial index     noted when compared to the previous examination.  ___________________________________________ Jessy Oto Fields, MD  CH/MEDQ  D:  06/27/2010  T:  06/27/2010  Job:  [id number]

## 2010-07-25 ENCOUNTER — Ambulatory Visit (INDEPENDENT_AMBULATORY_CARE_PROVIDER_SITE_OTHER): Payer: Medicare Other | Admitting: Vascular Surgery

## 2010-07-25 ENCOUNTER — Encounter (INDEPENDENT_AMBULATORY_CARE_PROVIDER_SITE_OTHER): Payer: Medicare Other

## 2010-07-25 DIAGNOSIS — I739 Peripheral vascular disease, unspecified: Secondary | ICD-10-CM

## 2010-07-25 DIAGNOSIS — L98499 Non-pressure chronic ulcer of skin of other sites with unspecified severity: Secondary | ICD-10-CM

## 2010-07-25 DIAGNOSIS — Z48812 Encounter for surgical aftercare following surgery on the circulatory system: Secondary | ICD-10-CM

## 2010-07-26 NOTE — Assessment & Plan Note (Signed)
OFFICE VISIT  [name], [name] DOB:  1956/02/25                                       07/25/2010 [id number]  The patient returns for followup today.  She recently underwent angioplasty and stenting of her left superficial femoral artery for in- stent restenosis as well as angioplasty of her left popliteal stent for in-stent restenosis and placement of a new left popliteal artery stent. This was performed by Dr. Trula Slade on April 17.  She reports that her foot feels better since then.  However, her wound on her toe has failed to heal.  She denies any fever or chills.  She has had some drainage from the toe.  PHYSICAL EXAM:  The left fourth toe is macerated at the tip and erythematous all the way to the base of the foot.  It is slightly tender to palpation.  There is no erythema extending into the forefoot.  She has a well-healed right below knee amputation.  The first and fifth toes are intact with no ulcers on the left foot.  She had a left-sided ABI performed today which shows the ABI on the left side has improved from 0.36 to 1.03.  She also had duplex ultrasound of her stents which showed no significant narrowing although she may be developing some narrowing in the proximal SFA but this was not seen on recent arteriogram.  In summary, the patient's arterial tree is improved significantly after the stenting procedure of Dr. Trula Slade recently.  I believe she has adequate revascularization to heal wounds on her left foot but the toe has failed to heal and I am sure has osteomyelitis.  I believe the best option for her would be amputation of her left fourth toe to prevent a deep seated infection in the foot.  This is scheduled for 08/06/2010. Risks, benefits, possible complications and procedure details were explained to the patient today.  She understands and agrees to proceed.    Jessy Oto. Kaan Tosh, MD Electronically Signed  CEF/MEDQ  D:   07/25/2010  T:  07/26/2010  Job:  [id number]

## 2010-07-26 NOTE — Procedures (Unsigned)
LOWER EXTREMITY ARTERIAL DUPLEX  INDICATION:  Multiple left lower extremity revascularizations with SFA and popliteal stents on 07/02/2010.  HISTORY: Diabetes:  Yes. Cardiac:  No. Hypertension:  Yes. Smoking:  No. Previous Surgery:  Right below knee amputation.  SINGLE LEVEL ARTERIAL EXAM                         RIGHT                LEFT Brachial:               185                  183 Anterior tibial:                             190 Posterior tibial:                            Not identified Peroneal: Ankle/Brachial Index:                        1.03  Previous ABI 06/27/2010:  Left 0.36   LOWER EXTREMITY ARTERIAL DUPLEX EXAM  DUPLEX:  Significant improvement in left ABI since prior exam. Patent left SFA and popliteal stents with elevated velocities observed in the left common femoral artery. Please see attached worksheet for velocity information.  IMPRESSION:  Patent left superficial femoral artery and popliteal stents with significant stenosis of the common femoral artery bifurcation; <50% stenosis observed at the native popliteal artery distal to the stent.  ___________________________________________ Jessy Oto. Fields, MD  LT/MEDQ  D:  07/25/2010  T:  07/26/2010  Job:  [id number]

## 2010-07-29 NOTE — Op Note (Signed)
[name], [name]              ACCOUNT NO.:  [id number]  MEDICAL RECORD NO.:  [id number]           PATIENT TYPE:  O  LOCATION:  [facility]                         FACILITY:  [facility]  PHYSICIAN:  Theotis Burrow IV, MDDATE OF BIRTH:  [birth year]  DATE OF PROCEDURE:  07/02/2010 DATE OF DISCHARGE:  07/02/2010                              OPERATIVE REPORT   PREOPERATIVE DIAGNOSIS:  Left leg ulcer.  POSTOPERATIVE DIAGNOSIS:  Left leg ulcer.  PROCEDURES PERFORMED: 1. Ultrasound access right femoral artery. 2. Abdominal aortogram. 3. Angioplasty, left superficial femoral and popliteal stents as well     as the native popliteal artery. 4. Third order catheterization.  Follow up x2.  INDICATIONS:  This is a 54 year old female who has previously undergone stenting of her left superficial femoral and popliteal artery.  She has developed a new sore on her toe and found to have severe decrease in her ankle-brachial indices.  She comes in today for diagnostic arteriogram and possible intervention.  PROCEDURE IN DETAIL:  The patient was identified in the holding and taken to room and placed supine on the table.  Both groins were prepped and draped in usual fashion.  Time-out was called.  Ultrasound was used to evaluate the right femoral artery.  It was found to be patent without significant calcification.  A digital ultrasound image was obtained. The artery was then accessed under ultrasound guidance with an 18-gauge needle and a 3.5 wire was advanced into the aorta under fluoroscopic visualization and a 5-French sheath was placed.  Over the wire an Omniflush catheter was advanced to the level of L1 and abdominal aortogram was obtained.  Next, using the Omniflush catheter and a Bentson wire, the aortic bifurcation was crossed.  The catheter was placed in the left external iliac artery and left leg runoff was performed.  FINDINGS:  Aortogram:  The visualized portions of the  suprarenal abdominal aorta showed no significant disease.  There are single renal arteries bilaterally which are widely patent.  The infrarenal abdominal aorta is widely patent, bilateral common external and internal iliac arteries are widely patent.  Left lower extremity:  The left common femoral artery is widely patent. The left profunda femoral artery has mild luminal narrowing at its origin, its main branches are patent.  The left superficial femoral artery is patent.  The stents are visualized within the left superficial femoral and popliteal artery.  There is a string sign within the distal stent.  Contrast opacifies the popliteal artery, there appears to be string sign versus occlusion of the distal stent.  There is single- vessel runoff via the anterior tibial artery which does cross the ankle.  At this point in time, the decision was made to proceed with intervention.  Over a Amplatz superstiff wire a 7-French Ansell 1 sheath was placed.  At this point, the patient was given systemic heparinization.  This was monitored with ACT measurement.  Using a Glidewire and quick-cross, the superficial femoral artery was selected. The wire and catheter were then advanced in tandem until I had crossed the stenosis.  Contrast injection was performed through the quick-cross catheter which confirmed  successful crossing of the stenosis.  I placed a Rosen wire through the [place] catheter which was then removed.  I then performed balloon angioplasty of the distal popliteal artery behind the knee as well as the entire length of the stents.  A followup arteriogram was performed which showed patency restored throughout the stents, however, the flow channel was relatively small.  I therefore elected to upsize the balloon to a 4-mm balloon.  Balloon angioplasty was then performed throughout the stent.  Once this was done, a followup study was performed which showed significantly improved flow  channel through the stents and into the anterior tibial artery.  There was residual narrowing in the proximal portion of the most proximal stent. I therefore repeated angioplasty at the level of the proximal stent and final shot was performed which showed restored patency and widely patent luminal flow channel through the stents and at the popliteal artery.  At this point, I was satisfied with these results.  Catheters and wires are removed.  The patient was taken to the holding area for sheath pull.  IMPRESSION:  Severe stenosis/string sign within the distal popliteal stent, this was progressively dilated up to a 4-mm balloon with restoration of in-line flow through the stents into the anterior tibial artery which is the single-vessel runoff.     Eldridge Abrahams, MD     VWB/MEDQ  D:  07/04/2010  T:  07/05/2010  Job:  [id number]  Electronically Signed by Orvan Falconer IV MD on 07/29/2010 10:57:31 PM

## 2010-07-30 ENCOUNTER — Ambulatory Visit (HOSPITAL_COMMUNITY)
Admission: RE | Admit: 2010-07-30 | Discharge: 2010-07-30 | Disposition: A | Discharge status: Home / Self Care | Payer: Medicare Other | Source: Ambulatory Visit | Attending: Vascular Surgery | Admitting: Vascular Surgery

## 2010-07-30 ENCOUNTER — Other Ambulatory Visit: Payer: Self-pay | Admitting: Vascular Surgery

## 2010-07-30 DIAGNOSIS — I70269 Atherosclerosis of native arteries of extremities with gangrene, unspecified extremity: Secondary | ICD-10-CM

## 2010-07-30 DIAGNOSIS — I2510 Atherosclerotic heart disease of native coronary artery without angina pectoris: Secondary | ICD-10-CM | POA: Insufficient documentation

## 2010-07-30 DIAGNOSIS — Z79899 Other long term (current) drug therapy: Secondary | ICD-10-CM | POA: Insufficient documentation

## 2010-07-30 DIAGNOSIS — G609 Hereditary and idiopathic neuropathy, unspecified: Secondary | ICD-10-CM | POA: Insufficient documentation

## 2010-07-30 DIAGNOSIS — M869 Osteomyelitis, unspecified: Secondary | ICD-10-CM | POA: Insufficient documentation

## 2010-07-30 DIAGNOSIS — Z951 Presence of aortocoronary bypass graft: Secondary | ICD-10-CM | POA: Insufficient documentation

## 2010-07-30 DIAGNOSIS — E119 Type 2 diabetes mellitus without complications: Secondary | ICD-10-CM | POA: Insufficient documentation

## 2010-07-30 DIAGNOSIS — Z01812 Encounter for preprocedural laboratory examination: Secondary | ICD-10-CM | POA: Insufficient documentation

## 2010-07-30 LAB — CBC
HCT: 25.8 % — ABNORMAL LOW (ref 36.0–46.0)
MCV: 85.7 fL (ref 78.0–100.0)
RBC: 3.01 MIL/uL — ABNORMAL LOW (ref 3.87–5.11)
WBC: 7.8 10*3/uL (ref 4.0–10.5)

## 2010-07-30 LAB — BASIC METABOLIC PANEL
CO2: 26 mEq/L (ref 19–32)
Chloride: 103 mEq/L (ref 96–112)
Glucose, Bld: 86 mg/dL (ref 70–99)
Potassium: 4.8 mEq/L (ref 3.5–5.1)
Sodium: 138 mEq/L (ref 135–145)

## 2010-07-30 LAB — GLUCOSE, CAPILLARY
Glucose-Capillary: 67 mg/dL — ABNORMAL LOW (ref 70–99)
Glucose-Capillary: 73 mg/dL (ref 70–99)
Glucose-Capillary: 77 mg/dL (ref 70–99)

## 2010-07-30 NOTE — Assessment & Plan Note (Signed)
OFFICE VISIT   [name], [name]  DOB:  [birth year]                                       10/20/2007  [id number]   The patient underwent right carotid endarterectomy on August 26, 2007.  Over the last 48 hours she has noticed a small area of blistering on her  right neck.  She presented the office today for evaluation of this.  There was a 2 cm area in the central portion of her right neck incision.  This had a small amount of eschar on it.  I lifted this off and there  was a small amount of purulent material underneath the eschar.  The  wound was probed, it did not seem to penetrate any deeper than about 0.5  cm.  All purulent material was expressed, there was approximately 2 mL  total.  This was sent for culture.  There is no surrounding erythema.  There is no obvious fluctuance in the neck.  She has had no fevers.  She  was placed on Cipro and doxycycline today.  She was also given a  prescription for Tylenol #3 for pain.  She will follow up in 2 weeks'  time.  She will follow up sooner if the wound deteriorates or if she has  more increased drainage or fever.   Jessy Oto. Fields, MD  Electronically Signed   CEF/MEDQ  D:  10/20/2007  T:  10/21/2007  Job:  [id number]

## 2010-07-30 NOTE — Assessment & Plan Note (Signed)
OFFICE VISIT   [name], [name]  DOB:  [birth year]                                       06/13/2009  [id number]   The patient returns for followup today.  Her MRI scan showed edema in  the left foot with some possible osteomyelitis, however, some of the  inflammation at the tip of the metatarsals is probably secondary to  recent amputation.   On physical exam today she still has erythema and a large open wound on  the plantar aspect of her foot.  I believe the best option would be  debridement of her foot in the operating room tomorrow.  I have  discussed with her that she is at very high risk of limb loss.  However,  we cannot consider revascularization until we have the infection under a  little bit better control.  She understands and agrees to proceed with  foot debridement tomorrow.  We will admit her and plan on several days  of IV antibiotics to see if we can get the foot to turn the corner here.  Full H and P was dictated on the [facility] system.     Jessy Oto. Fields, MD  Electronically Signed   CEF/MEDQ  D:  06/13/2009  T:  06/14/2009  Job:  [id number]

## 2010-07-30 NOTE — Assessment & Plan Note (Signed)
[place] OFFICE NOTE   [name], [name]                     MRN:          [id number]  DATE:04/14/2007                            DOB:          [birth year]    [name] returns today for follow-up.   She just had her heart catheterization.  She had a subendocardial MI at  the time of an amputation in the hospital a few months ago.  Took her a  while to rehab from this since she was stable and we put off her cath  until she was feeling better.  Turns out that she has a 100% occluded  RCA with a fairly good collaterals.  EF was 65%.  Medical therapy was  indicated.  When I saw her filling pressures were little bit low and  since she had collaterals, I cut back on her Lasix to 20 once a day and  added isosorbide.  She has had a bit of a headache with this and she  will try to cut this in half to 15 mg a day or take it every other day.  She says that has helped with her chest pain.   REVIEW OF SYSTEMS:  Otherwise remarkable for significant anemia.  She  will have this worked up by Dr. Nils Pyle.   As I recall, hematocrit has been as low as 26.8.   I gave her the name of Dr. Cristina Gong and Dr. Olevia Perches in case she needs a  referral to a GI doctor.   Otherwise she has been doing fairly well.  She is ambulating with a new  right lower extremity prosthetic device.  Her diabetes has been under  good control.  She has been compliant with her other medicines.   Her meds include loratadine 10 mg an aspirin a day, ranitidine 150,  metformin 500 b.i.d. gabapentin, Lipitor 80 a day, amitriptyline,  metoprolol 100 a day, Accupril 20 a day, Zoloft 100 a day, insulin as  directed Lasix 20 a day, isosorbide 30 a day.   EXAM:  She looks well.  Affect is appropriate.  Weight is 197, blood pressure is 100/60, pulse is 80 and regular,  afebrile respiratory 14.  HEENT:  Unremarkable.  Carotids are normal.  She does have a bit of  turbulent flow possible  bruit in the right neck area.  Otherwise no JVP elevation  lymphadenopathy.  LUNGS:  Clear diaphragmatic motion.  No wheezing.  S1-S2 normal heart sounds.  PMI normal.  ABDOMEN:  Benign.  Bowel sounds positive.  No AAA no tenderness, no  hepatosplenomegaly or hepatojugular reflux.  Groin left femoral artery site is well-healed without ecchymosis or  bruits.  She is status post right below-knee amputation.  NEURO:  Nonfocal.  SKIN:  Warm and dry.   IMPRESSION:  1. Coronary disease.  Medical therapies occluded right with      collateralization.  Adjust dose of isosorbide to see if headache      improved,  2. Hypercholesterolemia in the setting of diabetes, coronary disease.      Continue high-dose Lipitor, lipid liver profile  in 6 months.  3. Hypertension currently well controlled.  Continue Accupril 20 a      day, low-salt diet.  4. Previous edema in relation to right lower extremity stump her      filling pressures were low.  Her blood pressures to little bit on      the low side.  At some point I may stop her diuretic altogether.      She is not having any postural signs at this time and we will      continue to follow this.  5. Diabetes follow-up with primary care M.D. hemoglobin A1c quarterly.  6. Significant anemia, likely iron-deficient she will need an EGD and      colonoscopy to be arranged by Dr. Nils Pyle.     Wallis Bamberg. Johnsie Cancel, MD, [facility]  Electronically Signed    PCN/MedQ  DD: 04/14/2007  DT: 04/14/2007  Job #: [id number]

## 2010-07-30 NOTE — Cardiovascular Report (Signed)
[name], [name]              ACCOUNT NO.:  [id number]   MEDICAL RECORD NO.:  [id number]          PATIENT TYPE:  OIB   LOCATION:  1961                         FACILITY:  [facility]   PHYSICIAN:  Peter C. Johnsie Cancel, MD, FACCDATE OF BIRTH:  [birth year]   DATE OF PROCEDURE:  DATE OF DISCHARGE:                            CARDIAC CATHETERIZATION   INDICATIONS:  Diabetic with abnormal Myoview, inferior lateral wall  ischemia.   Cine catheterization was done from left femoral artery.  [name]  status post right below-knee amputation and I did not want to risk any  embolic events to her stump.   Left main coronary artery had a 30% ostial stenosis.  The distal left  main had 50% tubular disease at the bifurcation of the LAD and circ.  This was looked at in multiple views and I did not think it was  surgical.  The LAD had 20-30% multiple discrete lesions in the proximal,  mid and distal vessel.  First diagonal branch had 80% diffuse disease  and was a 2 mm or less vessel.  Circumflex coronary artery had 30%  multiple discrete lesions in the proximal and midportion.  The first  obtuse marginal branch had 40% multiple discrete lesions.   There were significant left-to-right collaterals through septal  perforators.   The right coronary artery was 100% occluded over a long area in the mid  and distal RCA.  There were faint right-to-right collaterals in addition  to the left-to-right collaterals.   RAO VENTRICULOGRAPHY:  RAO ventriculography showed hyperdynamic LV  function with an EF of 65%.  There is a small gradient across the aortic  valve.  LV pressure is 126/7 with a post A-wave EDP of 20.  Aortic  pressure was 110/40.   IMPRESSION:  The patient's coronary disease will be treated medically.  She has significant anemia.  I believe her hemoglobin was in the 9  range.  This needs to be worked up further.  Her filling pressures were  somewhat low and she has hyperdynamic LV function.   We will cut her  Lasix back to 10 mg a day, and add nitrates to help with her  collaterals.  She is already on high-dose statin and beta blocker  therapy.  I will see her back in the office.      Wallis Bamberg. Johnsie Cancel, MD, [facility]  Electronically Signed     PCN/MEDQ  D:  03/30/2007  T:  03/30/2007  Job:  [id number]

## 2010-07-30 NOTE — Assessment & Plan Note (Signed)
OFFICE VISIT   ALIENA, GRONBERG  DOB:  09-02-1955                                       08/29/2009  [id number]   This patient returns for follow-up today.  She was last seen on May 25.  She recently underwent left superficial femoral artery stenting in  March.  She returns today for further wound check of her toe amputation  site and a foot wound on the left foot.   PHYSICAL EXAMINATION:  Today, she has a 2+ dorsalis pedis pulse on the  left foot.  She has a 10 x 7 mm open ulceration at her toe amputation  site at the left second toe.  There is a small amount of serous drainage  from this.  She has a 10 x 5 mm open wound on the plantar aspect of her  foot.  Both of these wounds have decreased significantly since her last  office visit.   Overall she continues to do well.  She states she still has some  occasional pain but I discussed with her previously that I do not  believe she needs any further pain medication at this point.  She will  follow up in 6 months' time to recheck the wounds on her foot.  She will  also have continued surveillance of her SFA stent in the near future.     Jessy Oto. Fields, MD  Electronically Signed   CEF/MEDQ  D:  08/29/2009  T:  08/30/2009  Job:  [id number]

## 2010-07-30 NOTE — Assessment & Plan Note (Signed)
OFFICE VISIT   [name], [name]  DOB:  Oct 30, 1955                                        December 31, 2007  CHART #:  [id number]   CURRENT PROBLEMS:  1. Status post coronary artery bypass graft x4 on December 01, 2007,      (left IMA to LAD, left radial artery graft to circumflex marginal,      saphenous vein graft to diagonal, saphenous vein graft to RCA).  2. Type 1 diabetes mellitus, status post right leg below-knee      amputation.  3. History of multiple methicillin-resistant Staphylococcus aureus,      soft tissue infections.  4. Status post right carotid endarterectomy in March 2009.   HISTORY OF PRESENT ILLNESS:  The patient returns for first office visit  after multivessel coronary revascularization for severe three-vessel  coronary artery disease with class III progressive angina.  She has done  well at home without recurrent angina and so far the surgical incisions  are healing adequately.  She denies symptoms of CHF or fluid retention.  She is walking now and is ready to start cardiac rehab and progress her  level of activity.  She takes pain medication for sternal and shoulder  soreness.   PHYSICAL EXAMINATION:  VITAL SIGNS:  Blood pressure 120/60, pulse 70,  respirations 18, saturation 97%.  GENERAL:  She is alert and oriented and pleasant.  LUNGS:  Breath sounds are clear and equal.  CHEST:  The sternum is stable and healing well.  CARDIAC:  Rhythm is regular.  There is no murmur.  EXTREMITIES:  The left arm incision is healing well except for an eschar  at the distal aspect with some mild erythema.  There is no drainage.  She has a good hand grip with the left hand is intact neurologically.  The left leg pain and incision is healing well.  There is minimal if any  pedal edema.   LABORATORY DATA:  PA and lateral chest x-ray shows stable postoperative  x-ray with sternal wires, all intact and well aligned.  There is no  significant  pleural effusion.   IMPRESSION AND PLAN:  The patient has done well, one month after  surgery.  I told her she did stop taking the Imdur.  With a blood  pressure increasing back towards normal, she will consult Dr. Johnsie Cancel  about resuming her Accupril 40 mg a day.  She will remain on her aspirin  and her other multiple medications that are on her discharge summary.  We will arrange for her to start outpatient cardiac rehab.  She will  continue another week of oral doxycycline to help suppress any MRSA  activity.  I will see her back for a final wound check in 2 weeks.   Ivin Poot, M.D.  Electronically Signed   PV/MEDQ  D:  12/31/2007  T:  01/01/2008  Job:  [id number]   cc:   Wallis Bamberg. Johnsie Cancel, MD, [facility]

## 2010-07-30 NOTE — Procedures (Signed)
CAROTID DUPLEX EXAM   INDICATION:  Followup evaluation of right ICA status post CEA.  The  patient with right neck swelling status post CEA.   HISTORY:  Diabetes:  Yes.  Cardiac:  No.  Hypertension:  Yes.  Smoking:  No.  Previous Surgery:  Right below knee amputation.  Right CEA with DPA on  08/26/2007 by Dr. Oneida Alar.  CV History:  Amaurosis Fugax No, Paresthesias No, Hemiparesis No                                       RIGHT             LEFT  Brachial systolic pressure:  Brachial Doppler waveforms:  Vertebral direction of flow:        Antegrade  DUPLEX VELOCITIES (cm/sec)  CCA peak systolic                   [id number]  ECA peak systolic                   [id number]  ICA peak systolic                   87  ICA end diastolic                   22  PLAQUE MORPHOLOGY:                  N/A  PLAQUE AMOUNT:                      N/A  PLAQUE LOCATION:                    N/A   IMPRESSION:  1. Right ICA without recurrent stenosis status post CEA.  2. What appears to be a fairly hypoechoic mass measuring 2.14 cm (AP)      x 3.07 cm is noted just anterior to the carotid bifurcation with no      flow noted within.   ___________________________________________  Jessy Oto. Fields, MD   PB/MEDQ  D:  09/16/2007  T:  09/16/2007  Job:  [id number]

## 2010-07-30 NOTE — Op Note (Signed)
[name], [name]:  [id number]   MEDICAL RECORD NO.:  [id number]          PATIENT TYPE:  OUT   LOCATION:  VASC                         FACILITY:  [facility]   PHYSICIAN:  Ivin Poot, M.D.  DATE OF BIRTH:  30-Mar-1955   DATE OF PROCEDURE:  12/01/2007  DATE OF DISCHARGE:  11/29/2007                               OPERATIVE REPORT   OPERATIONS:  1. Coronary artery bypass grafting x4 (left internal mammary artery to      left anterior descending, left radial artery graft to circumflex      marginal, saphenous vein graft to diagonal, saphenous vein graft to      right coronary artery).  2. Endoscopic harvest of the left leg greater saphenous vein.  3. Open harvest of the left forearm radial artery.   PREOPERATIVE DIAGNOSES:  1. Left main stenosis with class III progressive angina.  2. A 3-vessel coronary disease.   POSTOPERATIVE DIAGNOSES:  1. Left main stenosis with class III progressive angina.  2. A 3-vessel coronary disease.   SURGEON:  Ivin Poot, MD   ASSISTANTS:  1. John Giovanni, PA-C  2. Faylene Million. Dominick, PA-C   ANESTHESIA:  General.   INDICATIONS:  [name] a 55 year old obese diabetic who has had  recurrent chest pain.  Cardiac catheterization by Dr. Burt Knack  demonstrated significant left main and 3-vessel disease with fairly well-  preserved LV function.  She is felt to be a candidate for surgical  revascularization.  I saw the patient in the office and reviewed the  results of her cardiac catheterization.  I discussed the indications,  benefits, and alternatives of coronary artery bypass surgery for  treatment of her coronary artery disease.  I reviewed the major aspects  of the operation including location of the surgical incisions, the  choice of conduit to include radial artery, internal mammary artery, and  endoscopically harvest of the saphenous vein, and expected postoperative  recovery.  I reviewed with her the risks  of this operation, especially  the risks of wound infection, as she has had significant problems with  MRSA infections in the past and is probably colonized.  I also discussed  the potential risks of MI, stroke, bleeding, blood transfusion  requirement, and death.  She understood with baseline anemia that she  would probably require blood transfusion therapy.  She understood these  issues and agreed to proceed with the surgery under what I felt was an  informed consent.   OPERATIVE FINDINGS:  The patient's body habitus made exposure of the  heart difficult.  Her heart was enlarged and her aorta very  foreshortened and high in the mediastinum and into the neck.  The vein  was of good quality.  The mammary artery and radial artery were of good  quality.  The patient received 4 units of packed cells during the  surgery for starting hematocrit of 26%.   PROCEDURE:  The patient was brought to the operating room, placed supine  on the operating table where general anesthesia was induced under  invasive hemodynamic monitoring.  The chest,  abdomen, and legs were  prepped with Betadine and draped as a sterile field.  The left arm was  also prepped into the field.  A sternal incision was made as the  saphenous vein was harvested from the left leg using endoscopic  technique as well as left radial artery was harvested from the left  forearm using open technique.  After the radial artery was harvested,  the left arm was tucked into the side to avoid stretch injury to the  arm.  The internal mammary artery was harvested as a pedicle graft from  its origin at the subclavian vessels.  It was flushed with papaverine  heparin.  The sternal retractors were placed using the deep blades due  to her obese body habitus.  The pericardium was opened and suspended.  Pursestrings were placed in the ascending aorta and right atrium, and  after the vein had been harvested and inspected and found to be  adequate,  the patient was heparinized and cannulated and placed on  cardiopulmonary bypass.  The distal vessels were identified for  grafting.  The LAD was intramyocardial.  The distal right vessels were  too small to graft and the anastomosis was placed to the main right  beyond its point of occlusion.  The diagonal was a small but graftable  vessel.  The circumflex had diffuse disease.  Cardioplegia catheters  were placed for both antegrade and retrograde cardioplegia, and the  patient was cooled to 32 degrees.  The aortic cross-clamp was applied  and 800 mL of cold blood cardioplegia was delivered.  There was good  cardioplegia arrest and septal temperature dropped less than 15 degrees.  Cardioplegia was then delivered every 20 minutes or less while the cross-  clamp was applied.   The distal coronary anastomoses were then performed.  The first distal  anastomosis was to the distal RCA.  This was a 1.5-mm vessel, totally  occluded proximally.  A reverse saphenous vein was sewn end-to-side with  running 7-0 Prolene with good flow through the graft.  The second distal  anastomosis was to the circumflex marginal.  This was a 1.5-mm vessel  and the left radial artery was sewn end-to-side with running 8-0 Prolene  with good flow through the graft.  The third distal anastomosis was to  the diagonal branch of the LAD, which was a 1.2-mm vessel, proximal 80%  stenosis.  A reverse saphenous vein was sewn end-to-side with running 7-  0 Prolene with good flow through the graft.  The fourth distal  anastomosis was to the mid LAD, which was intramyocardial.  This was a  1.7-mm vessel with proximal left main stenosis.  The left IMA pedicle  was brought through an opening and the left lateral pericardium was  brought down onto the LAD and sewn end-to-side with running 8-0 Prolene.  There was good flow through the anastomosis after briefly releasing the  pedicle bulldog on the mammary artery.  The mammary  pedicle was then re-  clamped and the pedicle secured to the epicardium with Prolene sutures.  Cardioplegia was redosed.   While the cross-clamp was still in place, 3 proximal anastomoses were  performed on the ascending aorta using a 4.0-mm punch and running 7-0  Prolene.  Prior to tying down the final proximal anastomosis, air was  vented from the coronaries with a dose of retrograde warm blood  cardioplegia.  The cross-clamp was then removed and the heart was  reperfused and resumed a regular rhythm.  Air was aspirated from the vein grafts with a 27-gauge needle and each  graft was opened and had good flow.  Hemostasis was documented to the  proximal and distal anastomoses.  The cardioplegia catheters were  removed.  The patient was rewarmed and reperfused.  Temporary pacing  wires were applied.  The lungs re-expanded.  The ventilator was resumed.  The patient was then weaned from bypass without difficulty.  Cardiac  output and hemodynamics were stable.  Protamine was administered without  adverse reaction.  The cannulas were removed.  The mediastinum was  irrigated with warm vancomycin irrigation.  The leg and arm incisions  were closed in a standard fashion.  The superior pericardial fat was  closed.  Two mediastinal and a left pleural chest tube were  placed.  The sternum was closed with interrupted steel wire.  The  pectoralis fascia was closed in a running #1 Vicryl.  The subcutaneous  and skin layers were closed in running Vicryl and sterile dressings were  applied.  Total bypass time was 150 minutes.      Ivin Poot, M.D.  Electronically Signed     PV/MEDQ  D:  12/01/2007  T:  12/02/2007  Job:  [id number]   cc:   Juanda Bond. Burt Knack, Me[place] Oneida Alar, MD

## 2010-07-30 NOTE — Assessment & Plan Note (Signed)
[name] is back regarding her right below-knee amputation and  associated pain.  She complains of 9/10 pain still today.  She has a lot  of pain at the stump as well as phantom symptoms.  The imipramine did  not produce dramatic benefit.  She has not gotten in to see [employer] yet  due to scheduling issues.  She remains on her Neurontin 600 mg q.i.d.  and uses hydrocodone 10 for breakthrough pain.  Pain is burning, aching,  and constant.  Pain interferes with general activity, relations with  others, enjoyment of life on a moderate level.  Pain seems to be worse  in the evening hours.  She also has more pain when she walks for longer  distances as she was recently.  She notes some blistering on the distal  limb.  Her leg often clicks in the socket and squeaks at times too with  rotation and movements noted.  She changed to her old line and this  seems to have helped a bit.   REVIEW OF SYSTEMS:  Notable for numbness, tingling, depression, sleep  apnea, wheezing, high sugars, bladder control issues at times.  Other  pertinent positives are above and full review is in the written health  and history section of the chart.   SOCIAL HISTORY:  [name] married, living with her husband and  son.   PHYSICAL EXAMINATION:  VITAL SIGNS:  Blood pressure is 161/71, pulse is  90, respiratory rate is 18, and she is sating 98% on room air.  GENERAL:  [name] pleasant, alert and oriented x3.  Affect is  generally bright and appropriate.  Gait is notable for some antalgia on  the right leg.  We removed the socket and she had again a blister and  ulceration over the distal tibia, about 0.5 cm in diameter.  She had  some tenderness along the wound line, but no particular nodules were  appreciated today.  Otherwise, skin was intact.  She had some pressure  areas over the prepatellar and patellar regions as well.  Skin condition  was good otherwise.  Left lower extremity strength was intact  with  perhaps some decrease in sensation distally.   ASSESSMENT:  1. Right below-knee amputation with phantom and stump pain.  2. Insulin-requiring diabetes.   PLAN:  1. Increase imipramine to 100 mg nightly.  2. We will add Tegretol 200 mg nightly, then b.i.d. thereafter in      addition to the Neurontin.  3. I refilled her Norco 10/325 one q.6 h. p.r.n., #120.  4. The patient will see [employer] for socket adjustment.  5. Consider next step based on the response to the above.      Meredith Staggers, M.D.  Electronically Signed     ZTS/MedQ  D:  07/12/2008 12:46:52  T:  07/13/2008 01:44:12  Job #:  [id number]

## 2010-07-30 NOTE — Assessment & Plan Note (Signed)
OFFICE VISIT   Tonya, Foster  DOB:  1955-09-17                                       12/13/2009  [id number]   The patient returns for evaluation of left foot ulcers today.  She was  last seen in August of 2011.  She previously had a left femoral  superficial femoral artery stent placed in March of 2011.  At that time  she had partial amputation of a toe and also an ulcer on the plantar  aspect of her foot.  When she was seen on 10/17/2009 she had a 2+  dorsalis pedis pulse.  All of her wounds were completely healed.  Apparently recently she had an orthotic placed in her left shoe for  improvement of foot symptoms.  She developed breakdown and developed  several new ulcers on her left foot including reopening of the plantar  ulcer on her left foot.  She thinks that all of this may have been  related to the orthotic.  She does have a history of neuropathy.  She  has also had new onset of claudication in the left calf and this occurs  at short distances.   Of note, she did have a recent vascular duplex ultrasound which showed  narrowing of her superficial femoral artery stent.  Unfortunately the  patient did not show up for followup after that recent finding on  ultrasound.   Chronic medical problems include diabetes, hypertension and elevated  cholesterol.   PAST SURGICAL HISTORY:  Right below knee amputation, right carotid  endarterectomy.   PHYSICAL EXAMINATION:  HEENT:  Unremarkable.  Chest:  Clear to  auscultation.  Cardiac:  Exam is regular rate and rhythm without murmur.  Left foot has a first toe ulcer on the dorsal aspect.  She has a 3 x 2  cm plantar ulcer which has granulation at the base.  She also has a 1 cm  ulcer in the web space between the first and second toe which is pink  and granulating.  She has a 2+ left femoral pulse.  She has absent  popliteal pulse.  She has a well-healed right below knee amputation.   The patient  has now had multiple recurrent ulcerations in her left foot.  These are most likely neuropathic in origin due to her orthotic device.  However, she does not have a palpable popliteal pulse and recent duplex  ultrasound suggested high grade stenosis of her superficial femoral  artery stent.  The recent claudication symptoms suggest that she may  have had an occlusion of her left superficial femoral artery stent.  In  light of this I have recommended she undergo an arteriogram which is  scheduled for tomorrow, September 30, possible angioplasty and stenting  of the left SFA would be performed at the same setting.  She was also  given a prescription for Percocet #20 dispensed today for the left calf  pain.  Risks, benefits, possible complications and procedure details  including but not limited to bleeding, infection, vessel injury were  explained to the patient today as well as possible risks of contrast  reaction.  She understands and agrees to proceed.     Jessy Oto. Fields, MD  Electronically Signed   CEF/MEDQ  D:  12/13/2009  T:  12/14/2009  Job:  [id number]

## 2010-07-30 NOTE — Consult Note (Signed)
[name], [name]              ACCOUNT NO.:  [id number]   MEDICAL RECORD NO.:  [id number]          PATIENT TYPE:  INP   LOCATION:  4736                         FACILITY:  [place]   PHYSICIAN:  [name], FACCDATE OF BIRTH:  Jul 21, 1955   DATE OF CONSULTATION:  10/06/2007  DATE OF DISCHARGE:                                 CONSULTATION   REQUESTING PHYSICIAN:  Tonya Lolling, Foster.   PRIMARY CARDIOLOGIST:  [name], [facility]   PRIMARY CARE PHYSICIAN:  [name] in [place].   REASON FOR CONSULTATION:  Positive cardiac enzymes.   HISTORY OF PRESENT ILLNESS:  This is a 55 year old Caucasian female with  known history of CAD with occluded distal right coronary artery with  collaterals, being treated medically; carotid artery disease, status  post right carotid endarterectomy on August 26, 2007; anemia; and  diabetes, who was in her usual state of health, but awoke about 3:00  a.m. by EMS telling her that she had had a seizure.  She had no prior  history of seizures.  The patient's son, who was there beside her in  bed, noted the seizure stating that it lasted about 10 minutes and had  postictal state lasting about 30 minutes.  The patient states she  remembers waking up, seeing EMS personnel suggesting that she come to  the emergency room.  She refused to go with EMS and allowed her husband  to take her to the [facility].  Once she was seen at [facility], she was found to need further assessment and evaluation and  was transferred to [facility] and admitted by the Teaching  Service, Dr. Thomes Foster.   The patient denies any complaints of chest discomfort, shortness of  breath, nausea, or vomiting prior to the episode.  She does complain of  some mild chest discomfort post seizure.  The patient does admit to  insomnia for several days prior to the event along with generalized  fatigue and she had some mild low-grade nausea on the day of  seizure  only.  Currently, the patient is without complaint.   REVIEW OF SYSTEMS:  Positive for chest pain post seizure, mild headache,  nausea, neurologic pain with insomnia for several days prior to the  event, and generalized fatigue.   PAST MEDICAL HISTORY:  1. Coronary artery disease with occluded distal right coronary artery      with collaterals, on medical treatment, per cardiac catheterization      in 2009 per Dr. Johnsie Foster.  At that time, the patient also had an LVEF      of 55%-65% without wall motion abnormality.  She had an elevated RV      pressure at 71 mmHg at that time.  New echocardiogram is pending.  2. History of aortic sclerosis.  3. Carotid artery disease, status post right endarterectomy on August 26, 2007, postop hematoma is noted.  4. Anemia, microcytic.  5. Diabetes.  6. Chronic neuropathic pain.  7. Hypertension.  8. Depression.  9. History of osteomyelitis, status post right below-the-knee  amputation secondary to MRSA bacteriemia.  10.She had post carotid surgery right tongue deviation.   SOCIAL HISTORY:  She lives in [place] with her 2 children, with her  sons and her husband.  She does not smoke.  Does not drink alcohol.   FAMILY HISTORY:  Father with CAD and carotid disease.  Sister with an MI  secondary to coronary artery spasm.   MEDICATIONS:  1. Amitriptyline 25 mg at bedtime.  2. Aspirin 81 mg daily.  3. Pepcid 20 mg daily.  4. Ferrous sulfate 325 mg daily.  5. Neurontin 600 mg t.i.d.  6. Heparin subcu q.8 h.  7. Insulin per sliding scale.  8. Crestor 40 mg at bedtime.  9. Zoloft 200 mg daily.   Med list the patient had been on, but had been placed on hold during  hospitalization:  1. Lasix 20 mg daily.  2. Isosorbide 60 mg daily.  3. Lisinopril 20 mg daily.  4. Metoprolol 50 mg b.i.d. secondary to hypotension.   CURRENT LABORATORY DATA:  Sodium 138, potassium 3.6, chloride 105, CO2  of 25, BUN 7, creatinine 0.85, and  glucose 139.  Hemoglobin 9.9,  hematocrit 30.2, white blood cells 5.7, and platelets 292.  MRI of the  brain revealed no significant abnormalities on October 05, 2007.  Chest x-  ray dated August 25, 2007, revealed no acute abnormalities.   PHYSICAL EXAMINATION:  VITAL SIGNS:  Blood pressure 85/55, heart rate  70, respirations 15, temperature 97.6, and O2 sat 99% on room air.  HEENT:  Head is normocephalic and atraumatic.  Eyes, PERRLA.  The  patient wears glasses.  Mucous membranes, mouth pink and moist.  Tongue  is midline with no deviation noted.  NECK:  Supple.  There is a postsurgical scar noted on the right with no  carotid bruits appreciated.  There is no JVD seen.  CARDIOVASCULAR:  Regular rate and rhythm with 1/6 systolic murmur  auscultated without rubs or gallops present.  Pulses are 2+ and equal  bilaterally, radially, and in the left foot.  ABDOMEN:  Soft and nontender.  Bowel sounds 2+.  EXTREMITIES:  Without clubbing, cyanosis, or edema.  She is status post  right BKA.  NEUROLOGIC:  Cranial nerves II-XII are grossly intact.   IMPRESSION:  1. Status post seizure.  2. Elevated cardiac enzymes.  a:  Known coronary artery disease with 60% left main, 70%-80% small  diagonal 1, 100% right coronary artery with collaterals.  1. Carotid disease.  a:  Status post right carotid endarterectomy in June 2009.  1. Diabetes.   PLAN:  This is a very pleasant 55 year old Caucasian female with  multiple medical problems who was admitted secondary to seizure and we  are asked to see the patient secondary to positive cardiac enzymes.  The  patient has been seen and examined by myself and Dr. Jenkins Rouge during  this consultation.  The patient's echocardiogram has been reviewed by  Dr. Johnsie Foster and found to have normal LV function with mild LVH.  EKG  revealed no acute changes.  Our plan will be to continue to monitor her  closely and allow for Dr. Clifton James to continue evaluation secondary to   seizure activity.  Our plan will be to schedule cardiac catheterization  for October 08, 2007, for evaluation of known coronary artery disease and  its contribution to seizure activity.  In the interim, we agree with the  patient's medications being stopped secondary to hypotension, but we  will reinstitute  metoprolol at a very low dose at 12.5 mg b.i.d. with  parameters on blood pressure.  The patient will also need to be hydrated  and will be given normal saline.  We will follow closely throughout  hospitalization making further recommendations depending upon the  patient's response to treatment.      Phill Myron. Purcell Nails, NP      [name], [facility]  Electronically Signed    KML/MEDQ  D:  10/06/2007  T:  10/07/2007  Job:  [id number]   cc:   Gwenyth Allegra. [place], Foster

## 2010-07-30 NOTE — Procedures (Signed)
CAROTID DUPLEX EXAM   INDICATION:  Follow up right carotid endarterectomy.   HISTORY:  Diabetes:  Yes.  Cardiac:  No.  Hypertension:  Yes.  Smoking:  No.  Previous Surgery:  Right below-knee amputation and right carotid  endarterectomy.  CV History:  Amaurosis Fugax No, Paresthesias No, Hemiparesis No.                                       RIGHT             LEFT  Brachial systolic pressure:         150  Brachial Doppler waveforms:         Biphasic  Vertebral direction of flow:        Antegrade  DUPLEX VELOCITIES (cm/sec)  CCA peak systolic                   [id number]  ECA peak systolic                   117               0000000  ICA peak systolic                   75                Occluded  ICA end diastolic                   29                Occluded  PLAQUE MORPHOLOGY:                  None              Heterogenous  PLAQUE AMOUNT:                      None              Severe  PLAQUE LOCATION:                    None              ICA, ECA   IMPRESSION:  1. Occluded left internal carotid artery.  2. Normal carotid duplex noted in the right internal carotid artery,      status post right carotid endarterectomy.  3. Antegrade bilateral vertebral arteries.   ___________________________________________  Jessy Oto Fields, MD   MG/MEDQ  D:  03/01/2008  T:  03/01/2008  Job:  [id number]

## 2010-07-30 NOTE — Assessment & Plan Note (Signed)
[name] returns to clinic today for followup evaluation.  The patient had undergone a right carotid endarterectomy on August 26, 2007. Unfortunately, she is gone on to require a coronary artery bypass  grafting x4 in December 01, 2007.  She is still recovering from that  heart surgery but did take a slightly increased amount of hydrocodone  shortly after the heart surgery. She still reports phantom pain to some  degree in her right lower extremity but then Neurontin does help and she  has had a recent refill.  She reports she continues to take NPH insulin  16 units twice a day and her recent blood sugars have been in the 80 to  127 range.  She does need a refill on her hydrocodone in the office  today.   MEDICATIONS:  1. Glucophage 1000 mg daily.  2. Zoloft 200 mg daily.  3. Elavil 25 mg daily.  4. Lipitor 80 mg daily.  5. Toprol-XL 100 mg daily.  6. Accupril 20 mg daily.  7. Lasix 20 mg b.i.d.  8. Robaxin 500 mg q.6 h. p.r.n.  9. Neurontin 600 mg q.i.d.  10.Zyrtec with Claritin daily.  11.NPH insulin 60 units b.i.d.  12.Hydrocodone 10/325 mg 1 tablet q.i.d. p.r.n.   REVIEW OF SYSTEMS:  Positive for high blood sugar.   PHYSICAL EXAMINATION:  GENERAL:  Reasonably well-appearing adult female  with well-healing scar of the right neck along with her chest and left  arm.  VITAL SIGNS:  Blood pressure is 133/70 with a pulse of 83, respiratory  rate 18, and O2 saturation 100% on room air.  HEENT:  Tongue deviates sliding to the right with protrusion.  She ambulates without any assistive device.   IMPRESSION:  1. Status post right below-knee amputation in January 2008.  2. Insulin-dependent diabetes mellitus, controlled.  3. Hypertension.  4. Status post coronary artery bypass grafting x4 in December 01, 2007.  5. Left carotid endarterectomy on August 26, 2007.   In the office today we did refill the patient's hydrocodone.  She is  back to her usual regimen of  approximately 4 per day as needed.  She  certainly is allowed extra tablets at the time of her coronary artery  bypass grafting secondary to her sternal pain.  She has returned to her  baseline of approximately 4 per day at present.  We will plan on seeing  the patient in followup in approximately 3 to 4 months time with refills  prior to that appointment if necessary.           ______________________________  [name], M.D.    DC/MedQ  D:  01/19/2008 11:30:18  T:  01/20/2008 02:30:50  Job #:  [id number]

## 2010-07-30 NOTE — Assessment & Plan Note (Signed)
OFFICE VISIT   [name], [name]  DOB:  [birth year]                                        January 14, 2008  CHART #:  [id number]   CURRENT PROBLEMS:  1. Status post multivessel coronary artery bypass surgery on December 01, 2007, using left radial artery and left internal mammary      artery.  2. Type 1 diabetes mellitus with methicillin-resistant Staphylococcus      aureus colonization.   HISTORY OF PRESENT ILLNESS:  The patient returns for a wound check.  She  is gaining strength and her exercise tolerance has significantly  improved.  She has been walking with her son [name], and has not had to  stop for a shortness of breath like she needed to before surgery.  She  has had one small area of drainage from the most distal aspect of the  lower sternal incision and was recently given a course of oral Cipro by  Dr. Johnsie Foster.  Otherwise, the incisions are doing well on her chest, leg,  and left arm.   PHYSICAL EXAMINATION:  VITAL SIGNS:  Blood pressure 160/80, pulse 92 and  regular, respirations 16, and saturation 98%.  LUNGS:  Clear.  CHEST:  The sternum is stable and well healed except for a small suture  granuloma - defect at the distal aspect of the sternum in the dermis  tissue only.  This was opened under local topical anesthesia and a small  suture was removed and the area was cleaned with peroxide - saline and a  Neosporin - Band-Aid dressing was applied.   PLAN:  The patient was given another 2-week course of oral doxycycline.  We will call the cardiac rehab program at [facility] to contact  the patient to begin her outpatient rehab.  She knows she can do light  household activities and drive.  She was also given a refill for  her Vicodin for lower neck and shoulder soreness following surgery.  I  will see her back in 1 month for wound check.   [name], M.D.  Electronically Signed   PV/MEDQ  D:  01/14/2008  T:   01/15/2008  Job:  [id number]   cc:   [name]. Tonya Cancel, MD, [facility]

## 2010-07-30 NOTE — Assessment & Plan Note (Signed)
OFFICE VISIT   Tonya, Foster  DOB:  Sep 24, 1955                                       01/03/2010  [id number]   The patient returns for followup today after recent angioplasty of left  superficial femoral artery in-stent restenosis as well as angioplasty  and stenting of her left popliteal artery and a segment of her left  superficial femoral artery.  She has been doing local wound care on a  plantar ulcer of her left foot as well as a left first toe ulcer.  She  states that her foot feels much warmer since the procedure.  She is  doing saline wet-to-dry dressings twice daily on the left foot.   On physical exam today blood pressure is 182/93 in the right arm, heart  rate 91 and regular, respirations 20.  Left foot has a 2+ dorsalis pedis  pulse.  She has an 8 x 3 mm ulceration on the left first toe with  minimal depth which has almost healed.  She has a 15 x 5 x 1 mm  ulceration on the left plantar aspect of her foot which has good pink  granulation tissue at the base.   She had a left-sided ABI today which shows her ABI has gone from 0.48  pre procedure to 0.98 currently.   Overall the patient is doing well.  Her wound continues to heal.  She  was given a prescription today for 30 Percocet tablets with no further  refills.  She will continue wet-to-dry dressings on her left foot.  She  will follow up in 1 month's time to recheck her wound.  She will have a  3 month carotid and leg protocol scan in January of 2012.     Jessy Oto. Fields, MD  Electronically Signed   CEF/MEDQ  D:  01/03/2010  T:  01/04/2010  Job:  [id number]

## 2010-07-30 NOTE — Discharge Summary (Signed)
NAMETZIPORA, PENTICO              ACCOUNT NO.:  [id number]   MEDICAL RECORD NO.:  [id number]          PATIENT TYPE:  INP   LOCATION:  4736                         FACILITY:  [facility]   PHYSICIAN:  Thomes Lolling, M.D.    DATE OF BIRTH:  [birth year]   DATE OF ADMISSION:  10/05/2007  DATE OF DISCHARGE:  10/09/2007                               DISCHARGE SUMMARY   DISCHARGE DIAGNOSES:  1. Seizure episode, new-onset.  2. Insulin-dependent type 2 diabetes complicated by severe neuropathy.  3. Coronary artery disease, with occluded right coronary artery and      severe left coronary artery stenosis on cardiac catheterization      this admission.  4. History of methicillin-resistant Staphylococcus aureus bacterial      infection.  5. Hypertension.  6. Anemia of chronic disease.  7. Hyperlipidemia.  8. Peripheral vascular disease, s/p R BKA amputation 03/2006.  9. 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  10.Depression.   DISCHARGE MEDICATIONS:  1. Aspirin 81 mg once daily.  2. Crestor 40 mg once daily.  3. Metoprolol 25 mg twice daily.  4. Iron sulfate 325 mg 3 times daily.  5. Neurontin 600 mg 3 times daily.  6. NPH insulin 35 units twice daily.  7. Zantac 150 mg twice daily.  8. Zoloft 50 mg in the morning and 100 mg at night.  9. Accupril 20 mg once daily.  10.Glucophage 500 mg twice daily.  11.Nitroglycerin 0.4 mg sublingually q.5 minutes for chest pain as      needed.  12.Robaxin 500 mg q.6 as needed for spasms.   Of note, she was given a discharge medication list.  This is what the  Medicine Services what had discharged her on, given she had stayed and  not left AMA.   DISPOSITION AND FOLLOWUP:  Ms. Dittmer left the hospital against  medical advise.  At the time, she was waiting a consult by  Cardiovascular and Thoracic Surgery regarding progression of  her  coronary artery disease.  She was unwilling to wait for this and was  advised to follow up with her primary care Sabra Sessler and cardiologist for  further care.   PROCEDURES:  1. A 2-D cardiac echo on October 06, 2007:  Overall, left ventricular      systolic function was normal.  Left ventricular EF was estimated to      be 60%.  Left ventricular wall thickness mildly increased.  Aortic      valve mildly-to-moderately calcified.  Moderate mitral annular      calcification.  Mildly dilated left atrium.  There was no evidence      for a cardiac source of embolism.  2. Cardiac catheterization on October 08, 2007, findings revealed severe      left main stenosis, 75% of osteal left main stenosis with 50%      distal left main stenosis.  There is a chronic right coronary      artery occlusion  with left-to-right collaterals.  There is      nonobstructive left anterior descending/LC stenosis.  Severe      diagonal stenosis.  Recommendations were for a CVTS consult for      potential CABG.  3. An EEG performed on October 07, 2007:  There was normal recording with      the patient awake and sleep.   CONSULTATIONS:  Medicine Service consulted Ms. Vester's usual  cardiologist, Dr. Johnsie Cancel.  He recommended a cardiac catheterization and  subsequent CVTS surgical consult.   ADMISSION HISTORY AND PHYSICAL:  Ms. Stiegler is a 55 year old white  female with a past medical history significant for diabetes type 2 with  right below-the-knee amputation, coronary artery disease, peripheral  vascular disease, and carotid stenosis presented with a probable  seizure.  She was on her usual state of health before bed the night  before admission.  She remembers some unusual intense haziness on her  left hand, but otherwise, the night was usual.  At 11 p.m., she checked  her blood glucose, which was 153.  She turned off her light at 3 a.m.  The next sensorium was being awakened by EMS telling her that she had a   seizure in her bedroom.  As per the son, who is 10 years old and he  sleeps in the same room with his mother, he was awoken by her violent  shaking at around 5 a.m.  He described her whole body shaking violently,  left arm in particular was flailing, eyes rolled back in her head, and  mild foaming at the mouth.  This lasted approximately 10 minutes.  She  was incoherent for about another 20 minutes afterwards.  Denies tongue  biting, but lips she wet herself.  CBG was measured at that time at 142.  Since the seizures, she has been feeling normal, though a little bit  sore in her chest and back.  She also has a mild headache at the time of  admission, which she said that she has been having on and off about a  week, but it is not abnormal.  Denies changes in visions, weakness, or  tremor.  Denies significant chest pain, shortness of breath, fevers, or  recent trauma.  She did experience an episode of nausea and vomiting day  before admission, however, that had resolved by the afternoon.   ADMISSION PHYSICAL EXAM:  VITAL SIGNS:  Temperature 97.0, blood pressure  135/87, pulse 110, respiratory rate 20 breaths per minute, and O2 sat  96% on room air.  GENERAL:  Ms. Salzwedel is a moderately obese white female.  She was  lying in bed in no acute distress.  HEENT:  Her pupils were equal, round, and reactive to light.  Extraocular movements were intact.  She has a scar on the right side of  her neck, which is well healed.  No carotid bruits were appreciated.  NECK:  There was no thyromegaly appreciated on neck exam.  Supple.  RESPIRATORY:  Lungs were clear to auscultation bilaterally without any  abnormal lung sounds.  HEART:  Revealed a tachycardic rate, but regular rhythm.  No murmurs,  rubs, or gallops were appreciated.  ABDOMEN:  Obese, soft, nontender, and nondistended with good bowel  sounds.  EXTREMITIES:  Right below-the-knee amputation with healing wound.  There  was also what  appeared to be a healing ulcer on the left first toe.  Radial pulse 2+, however, tibial pulse cannot be appreciated on the left  lower extremity.  She was moving all 4 extremities spontaneously.  Cranial nerves II-XII were intact.  She had 5/5 strength in bilateral  upper extremities and 5/5 in the lower left extremities.  She had  diminished sensation in her left lower extremity.  Her upper right lower  extremity had full strength.  She did display tongue deviation to her  right side, which she stated was from a lesion of the 12th nerve, which  occurred during her carotid endarterectomy in June.  I was unable to  appreciate reflexes.  NEUROLOGIC:  She was alert and oriented x3 and had appropriate affect.   ADMISSION LABS:  Sodium 136, potassium 3.7, chloride 99, bicarb 24, BUN  12, creatinine 1.1, and glucose 170.  Bilirubin 0.4, alk phos 79, AST  23, ALT 37, protein 7.9, albumin 3.7, and calcium 9.2.  White blood cell  count was 8.6, hemoglobin 10.1, hematocrit 30.2, platelets 312, and MCV  83.1.  Lipase 162.  Urinalysis revealed 1+ protein, 3+ blood, with  negative nitrites, and 1+ leukocytes.  CK was 168, MB is 4.7, and  troponin 0.25.  PT 10.1, INR 0.98, and PTT 24.5.   HOSPITAL COURSE:  1. Seizure, new-onset.  By description of her event by witnesses, this      appeared to be a tonic-clonic event with classic confusion in the      postictal state.  Our goal throughout the hospital course was to      determine the cause.  Differential diagnoses at the time of      admission included a CVA, hypoxic event, tumor, other intracranial      mass lesion, infection, hypoglycemia, head trauma, or possibly its      results secondary to medications.  Imaging of CT was completed at      the hospital which revealed possible hemispheric edema.  However,      followup with head MRI was negative for any type of intracranial      lesion, malformation, or evidence of CVA.  CBGs the night prior and       upon arrival of the EMS were normal as per the husband.  Ms.      Gladwin does have significant coronary artery disease, almost      complete stenosis of her RCA and critical stenosis of her LCA.  It      is possible that she may have had an arrhythmia or cardiac event,      which contributed to low perfusion of her CNS vasculature.  This      could have triggered seizure.  On hospital, she was also      hypertensive at times down in to the 80s/50s.  She may then having      hypertensive episodes at home, which were again contributed to the      poor perfusion of her CNS vasculature and possibly triggering a      seizure.  She is on several meds, which were given simply for      seizure threshold, such as Elavil, Neurontin, Zoloft, and      loratadine.  We discontinued the Elavil and loratadine while in the      hospital.  Loaded dose of Neurontin and Zoloft.  She tolerated      going off these medicines and should be evaluated on the outpatient      basis.  We also discontinued medications, which affect blood      pressure including Lasix  and Imdur.  Her dose of metoprolol was      lowered and Accupril was continued.  Overall, it appears that her      seizure was related to a cardiovascular etiology.  A repeat cardiac      cath revealed progression for coronary artery stenosis, which will      make a possibility of decreased CNS perfusion secondary to an      arrhythmia or cardiac event more likely.  EEG showed no      abnormalities.  The negative EEG coupled with normal MRI, mixed      likely that Ms. Christoffel will not have another seizure.  However,      if that is due to her cardiovascular etiology, she will need a      close followup on these issues from her outpatient providers.  2. Type 2 diabetes.  Throughout her hospital course, Ms. Manansala's      sugars were allowed to run a little high in the upper 100s-200s to      avoid hypoglycemia.  We continued her NPH 35 units twice  daily with      sliding scale NovoLog meal coverage.  She was discharged home on      her home regimen of NPH and metformin.  This should be followed as      an outpatient.  3. Coronary artery disease.  The patient did not complain of any chest      pain while hospitalized.  Her EKGs were stable from last admission.      However, cardiac enzymes trended up approximately 8 hours after      admission.  CK was 283, MB was 4.7, and troponins were 1.51 after      previous normal readings at admission.  The cardiac enzymes trended      steadily down throughout course and normalized.  She states she      gets chest pain rarely and has to take her nitro only once every      month or two for pain.  However, of note, she endorses that she      rarely engages in any physical activity.  In spite of her seizure      workup, Ms. Ewen was seen by her cardiologist, Dr. Johnsie Cancel,      which seem prudent given her elevated cardiac enzymes.  He      recommended a repeat cardiac catheterization procedure to determine      whether or not her coronary artery disease progressed, which may      shed some light on her seizure.  The cardiac cath revealed a      complete RCA occlusion with severe left coronary artery stenosis.      CVTS was consulted regarding possibility of CABG.  However, the      patient refused to be consulted by them and left AMA.  she was      encouraged to follow up with her cardiologist in the future and      seek an eventual surgical consult to determine the risk versus      benefits of a CABG procedure.  4. History of MRSA infection.  The patient showed no signs of      infection while in the hospital.  Her right below-the-knee      amputation appeared well healed and did not show signs of      infection.  5. Hypertension.  The patient has a diagnosis  of hypertension and was      admitted on her home regimen of Lasix 20 twice daily, metoprolol 50      twice daily, and Accupril 20 once  daily.  However, while in the      hospital, her pressures ran low on several occasions being measured      in the 80s/50s.  All blood pressure medicines were stopped      including her Imdur, which can affect pressure.  Metoprolol was      started back again at 12.5 twice daily.  After this, her pressure      rose to above 150s/80s, so her metoprolol was titrated upwards to      25 mg twice daily and Accupril was planned for restart for before      she left AMA.  Since the hypertensive episodes may have contributed      to the seizure, she was advised to follow up with the seizures as      an outpatient.  However, since she left AMA, it is not clear what      home regimen she will continue to be on.  6. Anemia of chronic disease.  Her hemoglobin was 10.1 on admission      and dropped slightly during her course likely due to IV fluid      intake.  Her hemoglobin was 8.4 at discharge.  Iron and TIBC were      low to normal and ferritin was within normal limits.  B12 and      folate were also normal.  It is unclear if her anemia is in fact      due to anemia of chronic disease.  Hemoglobin should be followed as      an outpatient.  7. Hyperlipidemia.  Fasting lipid panel was obtained while the patient      was hospitalized.  She was continued on her home regimen of      Lipitor.  8. Peripheral vascular disease.  Her physical exam revealed poor      circulation and decreased sensation in her left lower extremity.      However, this condition was stable throughout her course.  No      further workup was obtained.  9. Carotid stenosis, status post right carotid endarterectomy.  The      patient's surgical wound on her neck appeared healing and intact.      The patient expressed she had not felt like herself since she the      surgery in June, however.  Since she had the procedure in June and      the left carotid is noted to be completely occluded, no further      workup was undertaken.  Her  poor carotid circulation may have      contributed to the seizure in the setting of hypotensive episode or      cardiac arrhythmia.  10.Depression.  The patient did not acknowledge any symptoms of      depression while hospitalized.  Denied feeling depressed or      anhedonic.  She was continued on her home regimen of Zoloft.  DISCHARGE LABS:  Sodium 142, potassium 4.1, chloride 107, bicarb 26, BUN  8, creatinine 1.02, glucose 143, and calcium 8.9.  White blood cell  count 5.0, hemoglobin 8.3, hematocrit 25.2, and platelet count 222.  Urine pregnancy test was negative.  TSH was 3.21.  Iron 47, total iron  banding capacity  is 253, present sat 19, vitamin B12 1125, folate 12.1,  ferritin 23, and erythropoietin 58.5.   DISCHARGE VITAL SIGNS:  Temperature 97.6, blood pressure 139/69, pulse  88, breathing 20 at minute, and O2 sat 97% on room air.      Burton Apley, M.D.  Electronically Signed      Thomes Lolling, M.D.  Electronically Signed    MC/MEDQ  D:  10/12/2007  T:  10/13/2007  Job:  [id number]   cc:   Wallis Bamberg. Johnsie Cancel, MD, [facility]  Sara Chu, M.D.

## 2010-07-30 NOTE — Procedures (Signed)
EEG NUMBER:  [id number].   CLINICAL HISTORY:  [name] a 55 year old with diabetes and a  right below-the-knee amputation.  She had a witnessed seizure.  She had  a previous myocardial infarction in April 2009.  The diagnosis is  (780.39).   PROCEDURE:  The tracing is carried out on a 32-channel digital Cadwell  recorder reformatted into 16 channel montages with one devoted to EKG.  The patient was awake, drowsy, and sleepy during the recording.  The  international 10/20 system lead placement was used.  Medications include  NovoLog, Crestor, Neurontin, aspirin, Pepcid, ferrous sulfate, Zoloft,  Lopressor, Tylenol, Ativan, and Percocet.   DESCRIPTION OF FINDINGS:  Dominant frequency is an 8 Hz, 35 microvolt  activity.  Background activity is a mixture of alpha and beta range  activity.  The patient drifts into natural sleep with rhythmic theta  range activity that is broadly distributed.  The patient drifts into  natural sleep with polymorphic delta range activity, vertex sharp waves,  and symmetric and synchronous sleep spindles.   There was no focal slowing.  There was no interictal epileptiform  activity in the form of spikes or sharp waves.   EKG showed regular sinus rhythm with ventricular response of 96 beats  per minute.   IMPRESSION:  Normal record with the patient awake and asleep.      Princess Bruins. Gaynell Face, M.D.  Electronically Signed     [id number]  D:  10/07/2007 23:37:09  T:  10/08/2007 10:40:55  Job #:  [id number]   cc:   Thomes Lolling, M.D.  Fax: [phone]

## 2010-07-30 NOTE — Assessment & Plan Note (Signed)
[name] is back regarding her right below-knee amputation.  She has  been followed by Dr. Theda Sers for some time for this.  She continues to  have substantial pain in the right stump and phantom area.  She rates  the pain as 7-9/10.  Currently, she is taking Elavil 25 mg nightly as  well as Neurontin 600 mg q.i.d. and hydrocodone 10/325 one q.i.d. p.r.n.  She describes the pain as constant, aching, although it is worse in the  nighttime hours.  Pain interferes with general activity, relations with  others, and enjoyment of life on a moderate-to-severe level.  Sleep is  fair to poor.   REVIEW OF SYSTEMS:  Notable for the above as well as numbness, tingling,  anxiety, weight gain, constipation, wheezing, coughing.  Full review is  in the written health and history section.  Other pertinent positives  are above.   SOCIAL HISTORY:  As noted above.   PHYSICAL EXAMINATION:  Blood pressure is 134/69, pulse is 79,  respiratory rate 18.  She is sating 98% on room air.  [name]  pleasant, alert and oriented x3.  Affect is bright and appropriate.  She  has some antalgia when she walks on the right leg.  The right prosthesis  socket appeared to be somewhat loose.  She has essentially 6 ply sock on  the area.  When we removed the socket, she had some callus over the  distal tibia and pain with palpation over the distal tibia and fibula  areas.  I did not palpate any particular nodules.  There was no obvious  skin breakdown.  The general condition of the skin was good.  Otherwise,  upper extremity strength was within normal limits.  She had normal left  lower extremity function and movement today.   ASSESSMENT:  1. Right below-knee amputation.  2. Insulin-requiring diabetes.  3. Persistent stump and phantom pain.   PLAN:  1. We will change her Elavil over to imipramine 50 mg at bedtime.  We      will look at increasing this depending on response and tolerance.      Also, may need to  look at another anticonvulsants.  We will stay      with hydrocodone 10/325 one q.i.d. for now.  May need to look at      longer acting narcotic agent.  2. I asked the patient to return to [place] for the sock reevaluation.      Apparently, she is having some movement in the socket and this is      causing a callus and pain over the distal limb.  She may need a      more total contact to promote better weight unloading.  3. I will see her back in about 4-6 weeks' time.      Meredith Staggers, M.D.  Electronically Signed     ZTS/MedQ  D:  05/31/2008 14:13:52  T:  06/01/2008 01:30:18  Job #:  [id number]

## 2010-07-30 NOTE — Consult Note (Signed)
NEW PATIENT CONSULTATION   [name], [name]  DOB:  [birth year]                                        November 16, 2007  CHART #:  [id number]   REASON FOR CONSULTATION:  Left main and 3-vessel coronary artery  disease.   PRIMARY CARE PHYSICIAN:  Dr. __________ Eagan.   CHIEF COMPLAINT:  Chest pain and weakness.   HISTORY OF PRESENT ILLNESS:  I was asked to evaluate this 55 year old  obese diabetic for potential surgical coronary revascularization who  recently determined significant left main stenosis and 3-vessel coronary  artery disease.  The patient was initially found to have coronary artery  disease when she had a subendocardial MI at the time of admission for a  right BKA for osteomyelitis.  She was also noted to have a carotid  bruit.  A subsequent cardiac cath after her amputation, surgery was  performed in January 2009, which showed moderate left main disease.  A  carotid ultrasound showed a 90% right carotid stenosis.  Her coronary  artery disease was treated medically and she underwent a right carotid  endarterectomy by Dr. Oneida Alar this summer.  [name] apparently  colonized with an MRSA as she had both MRSA infections of her foot prior  to the amputation and developed an MRSA superficial neck infection  following carotid endarterectomy.  During the recovery from her carotid  endarterectomy and treatment of the superficial wound infection, she has  had recurrent chest pain and she was studied following a recent  hospitalization for a seizure.  Workup of the seizure was unremarkable  with a nonspecific brain MRI and a 2D echo showed no evidence of  potential embolic source.  However, repeat cath showed increased severe  left main stenosis to 70-80% with a chronically occluded right coronary.  Her EF is 60% and she has no significant valvular disease.  Her LAD and  circumflex targets are adequate, but the right coronary target is  questionable.  Because of her coronary anatomy and symptoms of chest  pain during exertional activity, a surgical evaluation was requested.   PAST MEDICAL HISTORY:  1. Insulin-dependent diabetes, poorly controlled her last A1c of 7.2.  2. History of multiple MRSA soft tissue infections.  3. Status post right carotid endarterectomy in July 2009.  4. Status post right BKA.  5. Status post laparoscopic cholecystectomy.  6. Status post right radial head fracture and a cast (nondisplaced      fracture).   HOME MEDICATIONS:  Aspirin daily, vitamins daily, ranitidine 150 mg  daily, metformin 1 g b.i.d., Lipitor 80 mg daily, amitriptyline 25 mg  daily, metoprolol 100 mg daily, Accupril 20 mg daily, Zoloft 50 mg  b.i.d., and insulin 35 units of NPH q.a.m. and 30 units of NPH q.p.m.   ALLERGIES:  None.   SOCIAL HISTORY:  [name] disabled.  She has a 73 year old son and  is married.  She used to be a Psychologist, counselling.   She does not smoke and use alcohol.   FAMILY HISTORY:  Positive diabetes, hypertension, and heart disease.   REVIEW OF SYSTEMS:  She has had no significant weight gain, weight loss,  or febrile illnesses recently.  ENT:  Review is negative for active  dental problems or difficulty swallowing.  She denies diabetic  retinopathy.  THORACIC:  Review is negative for abnormal chest x-ray or  history of thoracic trauma.  She denies productive cough recently.  CARDIAC:  Review is positive for noted coronary artery disease.  GI:  Review is negative for hepatitis, jaundice, or blood per rectum.  UROLOGIC:  Review is negative for kidney stones or UTI.  VASCULAR:  Review is positive for reduced ABI and a carotid endarterectomy recently  performed with a subsequent superficial MRSA infection being treated  with oral doxycycline and Cipro.  Dr. Oneida Alar has recommended this oral  antibiotic coverage continue for another 2 to 3 weeks.  NEUROLOGIC:  Review is positive for depression,  anxiety, and neuropathy.  ENDOCRINE:  Review is positive for diabetes and negative for thyroid disease.  Neurologic review is negative for stroke.  Positive for seizure of  unknown etiology.   PHYSICAL EXAMINATION:  VITAL SIGNS:  [name] 5 feet 5 inches and  weighs 180 pounds.  Blood pressure is 120/70, pulse 88 and regular,  respirations 18, saturation 95% on room air, and temperature is 97.2.  GENERAL APPEARANCE:  A pleasant middle-aged female, in no acute  distress.  HEENT:  Normocephalic.  Pupils are reactive.  NECK:  Without JVD, mass, or bruit.  She has a recent right neck  incision without drainage, erythema, or fluctuance.  LYMPHATICS:  No palpable cervical or supraclavicular adenopathy.  Breath  sounds are clear and equal.  Thorax is without deformity.  CARDIAC:  Regular rhythm without S3 gallop or murmur.  ABDOMEN:  Obese without pulsatile mass or focal tenderness.  EXTREMITIES:  No clubbing or cyanosis.  She has mild left ankle edema.  She has a right below-the-knee amputation with a well functioning  prosthesis.  NEUROLOGIC:  Nonfocal.  SKIN:  A small ulcer at the tip of her left great toe.   LABORATORY DATA:  Reviewed the coronary arteriogram was recently  performed as well as her 2D echo.  She has 75% stenosis of the left  main, total occlusion of the right coronary, and fairly well-preserved  LV systolic function.   IMPRESSION AND PLAN:  The patient would benefit from surgical  revascularization.  Her most significant risk is a sternal wound  infection due to her poorly controlled diabetes and her history of prior  methicillin-resistant Staphylococcus aureus soft tissue infections.  Her  chest pain is infrequent and I would like to follow Dr. Oneida Alar'  recommendation of another 2 weeks of oral antibiotics (doxycycline and  Cipro) for the methicillin-resistant Staphylococcus aureus she had in  her neck, which would be in close proximity to the sternal incision.   If  she develops symptoms of unstable angina at the time of surgery, he may  be moved up.  She will be evaluated for a preoperative visit on Monday  November 29, 2007, and surgery will be scheduled for Wednesday  December 01, 2007.  I discussed the indications, benefits, and  alternatives of the surgery as well as the risk of the patient.  She  understands and agrees to proceed.   Ivin Poot, M.D.  Electronically Signed   PV/MEDQ  D:  11/16/2007  T:  11/17/2007  Job:  [id number]   cc:   Wallis Bamberg. Johnsie Cancel, MD, [facility]  Dr. Nils Pyle

## 2010-07-30 NOTE — Procedures (Signed)
CAROTID DUPLEX EXAM   INDICATION:  Follow up known coronary artery disease and right carotid  endarterectomy.   HISTORY:  Diabetes:  Yes.  Cardiac:  No.  Hypertension:  Yes.  Smoking:  No.  Previous Surgery:  Right carotid endarterectomy and right below-the-knee  amputation.  CV History:  Amaurosis Fugax No, Paresthesias No, Hemiparesis No.                                       RIGHT             LEFT  Brachial systolic pressure:         150  Brachial Doppler waveforms:         Biphasic  Vertebral direction of flow:        Antegrade         Antegrade  DUPLEX VELOCITIES (cm/sec)  CCA peak systolic                   72                99991111  ECA peak systolic                   130               0000000  ICA peak systolic                   99                Occluded  ICA end diastolic                   31                Occluded  PLAQUE MORPHOLOGY:                  None              Heterogeneous  PLAQUE AMOUNT:                      None              Severe  PLAQUE LOCATION:                    None              ICA and ECA.   IMPRESSION:  1. Occluded left internal carotid artery.  2. Normal carotid duplex noted in the right internal carotid artery.  3. Status post right carotid endarterectomy.  4. Antegrade bilateral vertebral arteries.   ___________________________________________  Jessy Oto Fields, MD   MG/MEDQ  D:  03/12/2009  T:  03/13/2009  Job:  [id number]

## 2010-07-30 NOTE — Discharge Summary (Signed)
[name], [name]:  [id number]   MEDICAL RECORD NO.:  [id number]          PATIENT TYPE:  INP   LOCATION:  3301                         FACILITY:  [facility]   PHYSICIAN:  Chad Cordial, PA    DATE OF BIRTH:  [birth year]   DATE OF ADMISSION:  08/26/2007  DATE OF DISCHARGE:  08/27/2007                               DISCHARGE SUMMARY   DISCHARGE DIAGNOSIS:  Right carotid occlusive disease.   PROCEDURE PERFORMED:  Right carotid endarterectomy with Dacron patch  angioplasty August 26, 2007.   COMPLICATIONS:  None.   DISCHARGE MEDICATIONS:  She is discharged on home on all of her previous  home medicines which consists of  1. Sudafed p.o. q.a.m.  2. Loratadine 10 mg p.o. q.a.m.  3. Polysaccharide 150 mg p.r.n.  4. Lasix 20 mg p.o. q.a.m. and p.m.  5. Metformin 500 mg p.o. b.i.d.  6. Neurontin 600 mg p.o. q.i.d.  7. Accupril 20 mg p.o. q.p.m.  8. Amitriptyline 25 mg p.o. nightly.  9. Omeprazole 20 mg p.o. b.i.d.  10.Lipitor 80 mg p.o. daily.  11.Metoprolol 50 mg p.o. b.i.d.  12.Zoloft 100 mg 2 tablets p.o. b.i.d.  13.Sucralfate 1 gm p.o. q.i.d.  14.Isosorbide 30 mg p.o. q.a.m.  15.Vitamin C 500 mg p.o. q.a.m.  16.Vitamin B12, 1000 mg p.o. q.a.m.  17.Multivitamin 1 p.o. q.a.m.  18.Aspirin 325 mg p.o. q.a.m.  19.NPH insulin 30 units subcu q.a.m. and 30 units subcu q.p.m.  She was given a prescription for Percocet 5/325, 1 p.o. q.4 h. p.r.n.  pain.   DISCHARGE CONDITION:  Stable with wound healing well.   DISPOSITION:  She is being discharged home in stable condition with her  wound healing well.  She is instructed to clean the wound with soap and  warm water.  She is instructed to increase her activity slowly, not to  travel for 2 weeks.  She may shower, starting August 28, 2007.  She is  instructed to observe her wound for drainage, swelling, increase in  pain, redness, fever greater than 101.2, or neurological changes.  She  is given an appointment to  see Dr. Oneida Alar in 2 weeks.  The office will  call with the appointment.   BRIEF IDENTIFYING STATEMENT:  For complete details, please refer to the  typed history and physical.   Briefly, this is a very pleasant woman with diabetes and hypertension  who was referred to Dr. Oneida Alar for evaluation of carotid occlusive  disease.  Her disease was significant and he recommended right carotid  endarterectomy for stroke prevention.  She was informed of the risk and  benefits of the procedure, and after careful consideration, elected to  proceed with surgery.   HOSPITAL COURSE:  Preoperative workup was completed as an outpatient.  She was brought in through same-day surgery and underwent the  aforementioned carotid endarterectomy.  For complete details, please  refer to the typed operative report.  The procedure went without  complications.  She was returned to the post-anesthesia care unit,  extubated.  Following stabilization, she was transferred to a bed on a  surgical step-down  unit.  She was observed overnight.  Her inotropic  agents were discontinued by morning.  The Foley was subsequent  discontinued.  Following ambulation and postpartum crawl, she was  discharged home in stable condition.      Chad Cordial, PA     KEL/MEDQ  D:  08/27/2007  T:  08/27/2007  Job:  [id number]

## 2010-07-30 NOTE — Assessment & Plan Note (Signed)
OFFICE VISIT   KOI, BRANDLI  DOB:  December 29, 1955                                       02/14/2010  [id number]   The patient returns for followup today.  She has had intermittent  episodes of neuropathic ulcers in the past in her left foot and has also  previously had left superficial femoral artery stenting.  She returns  for further followup today to check the wounds on her left foot.  She is  currently doing normal saline wet-to-dry dressings on them.   PHYSICAL EXAM:  Blood pressure is 129/58 in the right arm, heart rate is  79 and regular.  She has a 2+ dorsalis pedis pulse on the left foot.  She has a 2 mm ulceration on the medial aspect of the first toe of the  left foot.  The plantar ulcer is now completely healed.   Overall the patient is doing well.   On review of systems today she denied any shortness of breath or chest  pain.   She will follow up with a protocol scan of her left lower extremity as  well as her carotids in January of 2012.  She will continue to do her  normal saline wet-to-dry wound care on the toe until it is completely  healed.  I did offer her today diabetic protective shoes.  However, she  is resistant to this because of the appearance of the shoes.  I did  discuss with her again today prevention of neuropathic ulcers by  checking her feet and wearing good fitting shoes.     Jessy Oto. Fields, MD  Electronically Signed   CEF/MEDQ  D:  02/14/2010  T:  02/15/2010  Job:  [id number]

## 2010-07-30 NOTE — Assessment & Plan Note (Signed)
Ms. Jacobi returns to clinic today accompanied by her son.  The  patient is doing fairly well, although she has suffered nerve damage  involving her right tongue after recent carotid endarterectomy on August 26, 2007, by Dr. Oneida Alar.  Dr. Oneida Alar has followed up with the patient  and told her that she should probably have thorough recovery, although  she has significant paresis of her right tongue at the present time.  She also had some drainage from her neck, but that recently has stopped.  She has been using her hydrocodone generally 4 times per day and that is  helping with her neck pain along with her amputation site pain.  She  reports that she was off the Neurontin for few days prior to her surgery  and noticed substantially increased phantom pain and tingling along with  tingling of her bilateral legs.  She was restarted the Neurontin and  that pain has improved.   MEDICATIONS:  1. Glucophage 1000 mg.  2. Zoloft 200 mg.  3. Elavil 25 mg.  4. Lipitor 80 mg.  5. Toprol-XL 100 mg.  6. Accupril 20 mg.  7. Lasix 20 mg b.i.d.  8. Robaxin 500 mg q.6 h. p.r.n.  9. Neurontin 600 mg q.i.d.  10.Zyrtec with Claritin daily.  11.NPH, insulin 16 units b.i.d.  12.Hydrocodone 10/325 one tablet q.i.d. p.r.n.   REVIEW OF SYSTEMS:  Positive for high blood sugar and shortness of  breath.   PHYSICAL EXAMINATION:  GENERAL:  Reasonably well-appearing, well-  nourished adult female in mild-to-acute discomfort.  VITAL SIGNS:  Blood pressure was 121/56 with pulse of 82, respiratory  rate 18, and O2 saturation 97% on room air.  She has a well-healing  wound at the right neck and has obvious dysarthric speech related to the  tongue weakness.  Tongue deviates slightly to the right with protrusion.   IMPRESSION:  1. Status post right below knee amputation, January 2008.  2. Insulin-dependent diabetes mellitus, controlled.  3. Hypertension.  4. Continued phantom pain at the right lower extremity with  status      post right carotid endarterectomy by Dr. Oneida Alar on August 26, 2007,      with nerve damage to the right tongue.   In the office today, we did refill the patient's hydrocodone as of today  September 29, 2007.  No refill on the Neurontin is necessary.  We will plan  on seeing her in follow up in approximately 4 months' time with refills  prior to appointment if necessary.           ______________________________  Jarvis Morgan, M.D.     DC/MedQ  D:  09/29/2007 12:04:35  T:  09/30/2007 06:40:23  Job #:  [id number]

## 2010-07-30 NOTE — Assessment & Plan Note (Signed)
OFFICE VISIT   [name], [name]  DOB:  March 06, 1956                                       12/15/2007  [id number]   The patient returns for followup today.  She had a right carotid  endarterectomy performed in June of 2009.  She had some drainage from  this postoperatively which cultured out MRSA.  She was placed on  doxycycline and Cipro to treat this.  She recently underwent coronary  artery bypass grafting by Dr. Prescott Gum and has recovered well from  this.  Her left radial artery was harvested as well as her left greater  saphenous vein for this procedure.   PHYSICAL EXAMINATION:  On physical exam today blood pressure is 92/53,  pulse is 82 and regular.  Right neck scar is well-healed.  Her  sternotomy incision is healing with a slight amount of separation at the  top end but she says there is no drainage from this.   Overall the patient is doing well.  I have encouraged her to continue  taking her doxycycline and finish up her Cipro.  She will follow up in  December for a 6 month carotid duplex ultrasound.  She will return  sooner if she has any problems from her incision.   Jessy Oto. Fields, MD  Electronically Signed   CEF/MEDQ  D:  12/15/2007  T:  12/16/2007  Job:  [id number]

## 2010-07-30 NOTE — Assessment & Plan Note (Signed)
OFFICE VISIT   [name], [name]  DOB:  1956/01/20                                       03/28/2009  [id number]   CHIEF COMPLAINT:  Leg pain.   HISTORY OF PRESENT ILLNESS:  [name] a 55 year old female who  previously has undergone right carotid endarterectomy.  She returns  today for followup of her carotid disease.  However, she also states  that she has developed new pain in her left leg.  She states that she  has pain in the left calf constantly.  She states, however, that this  gets worse with walking after one block.  It has slowly been worse over  the last 3 months.  She has previously had a right below knee amputation  2 years ago by Dr. Veverly Fells for infection.  She has some pain in her left  foot at night time frequently.  She does also have a history of  neuropathy.  She developed an open wound on toes one and two on her left  foot recently after some dry skin was peeled off leaving her an ulcer.  These have been present for approximately 24 hours.  She states that  usually she is able to heal wounds like this within 3-4 days.  She  states that currently her glucose has been well-controlled.  She also  states that her blood pressure is usually well-controlled but was  elevated today.   CHRONIC MEDICAL PROBLEMS:  Include diabetes, coronary artery disease and  hypertension, all which are being followed by her primary care doctor  and under reasonable control.   She denies any neurologic events.  She specifically denies any episodes  of TIA, amaurosis or stroke.   PAST MEDICAL HISTORY:  Significant for diabetes, hypertension as  mentioned above.   FAMILY HISTORY:  Is remarkable for her father and sisters having  vascular disease at young age.   SOCIAL HISTORY:  She is married and has two children.  She does not  smoke or drink alcohol.   REVIEW OF SYSTEMS:  A full 12 point review of systems was performed with  the patient.   Please see intake referral form for details regarding  that.   PHYSICAL EXAM:  Vital signs:  Blood pressure is 186/82 in the right arm,  heart rate is 80 and regular.  Temperature is 97.8.  HEENT:  Unremarkable.  Neck:  Has 2+ carotid pulses without bruit.  Chest:  Clear to auscultation.  Cardiac:  Exam is regular rate and rhythm.  Abdomen:  Is obese, soft, nontender, nondistended.  No masses.  Extremities:  She has 2+ femoral pulses bilaterally.  She has a well-  healed right below knee amputation.  She has a 1+ left popliteal pulse.  She has absent pedal pulses in the left foot.  She has a healed vein  harvest site in her left leg.  Toes one and two on the left foot have  ulcers at the tip which are both approximately a centimeter in diameter.  These have pink, healthy appearing tissue at the base of each ulcer.   She had a left-sided ABI performed today which was 0.62 with monophasic  to biphasic waveforms.   She also had a carotid duplex exam today which shows a chronic occlusion  of her left internal carotid artery and a widely  patent right internal  carotid artery.  She had antegrade vertebral flow bilaterally.   I had a lengthy discussion with the patient today that although she does  have some claudication symptoms in her left lower extremity she also has  some symptoms of neuropathy.  She is currently on gabapentin for her  neuropathy symptoms.  As far as her claudication symptoms are concerned  these are a nuisance to her at this point but not completely disabling.  Unfortunately she does not have vein conduit in her left lower extremity  if she does need a bypass in the future.  I also discussed the  possibility of a percutaneous intervention but that a bypass or  angioplasty or stent would have durability that would not last forever.  I believe the best option for her currently is to wait until she has  worsening of symptoms either nonhealing wounds or a threatened limb  in  her left lower extremity.  For now I believe the best option is for her  to continue walking 30 minutes daily.  Hopefully her symptoms will  improve with time.  I will see her back in 2 weeks to make sure that the  wounds on her foot have completely healed.  If these begin to  deteriorate we will need to reassess whether or not revascularization is  necessary at this point.  She will also need a followup carotid duplex  exam and will be placed in our carotid surveillance protocol at this  time.     Jessy Oto. Fields, MD  Electronically Signed   CEF/MEDQ  D:  03/28/2009  T:  03/29/2009  Job:  [id number]   cc:   Dr Barnabas Lister C. Johnsie Cancel, MD, [facility]

## 2010-07-30 NOTE — Discharge Summary (Signed)
[name], [name]:  [id number]   MEDICAL RECORD NO.:  [id number]          PATIENT TYPE:  INP   LOCATION:  2022                         FACILITY:  [facility]   PHYSICIAN:  Ivin Poot, M.D.  DATE OF BIRTH:  [birth year]   DATE OF ADMISSION:  12/01/2007  DATE OF DISCHARGE:  12/09/2007                               DISCHARGE SUMMARY   HISTORY:  [name] a 55 year old obese diabetic female who has  been having recurrent chest pain.  Cardiac catheterization by Dr. Burt Knack  demonstrates severe left main and 3-vessel coronary artery disease with  a fairly well-preserved left ventricular function.  She was referred to  Dr. Tharon Aquas Trigt for a surgical revascularization and admitted to  this hospitalization for the procedure.   PAST MEDICAL HISTORY:  1. Insulin-dependent diabetes, poorly controlled, last A1c measured at      7.2.  2. History of multiple [name] soft tissue infections.  3. Right carotid endarterectomy, July 2009.  4. History of right below-knee amputation.  5. Laparoscopic cholecystectomy.  6. Right radial head fracture, treated with a cast.   Medications prior to admission included the following;  1. Loratadine 10 mg daily.  2. Aspirin 81 mg daily.  3. Multivitamin once daily.  4. Vitamin B12 1000 mg q.a.m.  5. Metformin ER 500 mg b.i.d.  6. Gabapentin 600 mg q.i.d.  7. Lipitor 80 mg daily.  8. Amitriptyline 25 mg daily, although had been taken in sometime.  9. Metoprolol 100 mg daily.  10.Accupril 20 mg daily.  11.Zoloft 100 mg b.i.d.  12.Ranitidine 150 mg b.i.d.   Family history, social history, review of systems, and physical exam,  please see the history and physical note at the time of admission.   HOSPITAL COURSE:  The patient was admitted electively on December 01, 2007 and taken to the operating room where she underwent coronary artery  bypass grafting x4.  The following grafts were placed;  1. Left internal mammary artery  to the LAD.  2. Left radial artery to the obtuse marginal.  3. Saphenous vein graft to the right coronary.  4. Saphenous vein graft to the diagonal.   The patient tolerated the procedure well, and was taken to the surgical  intensive care unit in stable condition.   Postoperative hospital course, the patient has overall done quite well.  She has remained neurologically intact.  She has remained  hemodynamically stable.  She has had no significant cardiac  dysrhythmias.  She does have an acute blood loss anemia, which has  stabilized.  Most recent values dated December 08, 2007 revealed  hematocrit of 32.5%.  Electrolytes, BUN, and creatinine are within  normal limits.  She has required a moderate diuresis.  She has required  aggressive glucose management, which has stabilized and is now on her  home regimen.  She has had some drainage from her sternal incision as  well as some erythema of her left arm incision.  She was treated with  intravenous vancomycin and subsequently been converted to oral  doxycycline as well as local wound care.  These incisions are felt to be  quite stable in appearance.  Overall, she has progressed fairly nicely  in regard to rehabilitation considering her limited mobility due to her  amputation, but progress is felt to be sufficient.  Oxygen has been  weaned and she maintained the good saturations on room air.  Her overall  status is felt to be stable for discharge on today's date December 09, 2007.   INSTRUCTIONS:  The patient received written instructions regarding  medications, activity, diet, wound care, and followup.  Followup include  Dr. Prescott Gum on December 31, 2007.  She will follow up with Dr. Burt Knack  in 2 weeks.  We have arranged home nursing care to check on the patient  in particular her incisions.   MEDICATIONS ON DISCHARGE:  1. Loratadine 10 mg daily.  2. Aspirin 81 mg daily.  3. Vitamin B12 1000 mg daily.  4. Metformin ER 500 mg  b.i.d.  5. Gabapentin 600 mg q.i.d.  6. Lipitor 80 mg daily.  7. Amitriptyline 25 mg at bedtime.  8. Lopressor 100 mg b.i.d.  9. Zoloft 100 mg b.i.d.  10.Ranitidine 150 mg b.i.d.  11.Lasix 40 mg daily for 5 days.  12.KCl 20 mEq for 5 days.  13.Imdur 30 mg daily for radial artery.  14.Oxycodone 5 mg 1-2 every 4-6 hours as needed for pain.  15.Lisinopril 40 mg daily.   FINAL DIAGNOSES:  Severe left main and 3-vessel coronary artery disease,  now status post surgical revascularization as described.   OTHER DIAGNOSES:  Include postoperative anemia, history of multiple [name]  soft tissue infections, postoperative sternal and radial artery minor  wound infections currently stable.  Next, history of right carotid  endarterectomy, history of right below-knee amputation, history of  laparoscopic cholecystectomy, and history of right radial artery  fracture.      John Giovanni, P.A.-C.      Ivin Poot, M.D.  Electronically Signed    WEG/MEDQ  D:  12/09/2007  T:  12/09/2007  Job:  [id number]   cc:   Ivin Poot, M.D.  Juanda Bond. Burt Knack, [place] Oneida Alar, MD

## 2010-07-30 NOTE — Assessment & Plan Note (Signed)
OFFICE VISIT   [name], [name]  DOB:  Feb 14, 1956                                       10/13/2007  [id number]   The patient returns for followup today after her right carotid  endarterectomy on June 11th.  She had postoperative hematoma with some  drainage.  She presents today for further evaluation.  She has had no  subsequent drainage from the neck.  The hematoma has completely resolved  at this point.  She is now off antibiotics.   PHYSICAL EXAMINATION:  Blood pressure today was 160/78 in the left arm  and 133/72 in the right arm, pulse was 79 and regular.  Right neck  incision is well-healed.  There is no evidence of drainage.  The  hematoma has completely resolved.  Her right and left neck are now  completely symmetric.  Neurologic:  Exam shows symmetric upper extremity  and lower extremity motor strength.  Sensation is intact.   She does report that she occasionally has some headaches on the right  side.  These were fairly nonspecific and I do not believe were related  to her carotid operation.  [name] now 6 weeks out from her right  carotid endarterectomy.  I believe all of her symptoms have resolved at  this point.  She will follow up in 6 months' time for a carotid duplex  exam and surveillance.  Of note, she stated that she recently had  evaluation of her coronary arteries and is going to require coronary  artery bypass grafting in the future, but she did not wait in the  hospital for evaluation by the cardiac surgeon and this is still  pending.   Jessy Oto. Fields, MD  Electronically Signed   CEF/MEDQ  D:  10/14/2007  T:  10/14/2007  Job:  [id number]

## 2010-07-30 NOTE — Assessment & Plan Note (Signed)
[place] OFFICE NOTE   [name], NECOCHEA                     MRN:          [id number]  DATE:07/13/2007                            DOB:          Aug 29, 1955    [name] returns today for followup.  She is a diabetic with a  subendocardial MI at the time of amputation at the end of 2008. At  catheterization. she was found have an occluded RCA with collaterals.  She did not have critical left-sided disease.  We have treated her  medically.  She has done well.  She is not having significant chest  pain.  She is ambulating on her new prosthesis.  The swelling has gone  down.  She continues to have significant neuropathic pain that the  amitriptyline and Neurontin do not seem to be helping. I told her that  she may want to stop these and just continue her oxycodone and rehab.  She will try this.   Her diabetes has been reasonably well controlled.  She was having some  difficulty getting in to see Dr. Nils Pyle. She had did have a followup  colonoscopy and endoscopy given her significant anemia.  She had  hemoglobin down to the 9 range.  She has not gotten a report on it yet.  I encouraged her to follow up with this.   REVIEW OF SYSTEMS:  otherwise negative.   CURRENT MEDICATIONS:  1. Omeprazole 20 b.i.d. added about the GI doctor.  2. Sucralfate 1 gram four times a day.  3. Lasix 10 a day.  4 . Loratadine 10 a day.  1. Baby aspirin a day.  2. Ranitidine 150 a day.  3. Metformin 500 b.i.d.  4. Gabapentin.  5. Lipitor 80 a day.  6. Amitriptyline 25 nightly.  7. Metoprolol 100 a day.  8. Accupril 20 a day.  9. Zoloft 100 a day.  10.Novolin.  11.Isosorbide 30 a day.   PHYSICAL EXAMINATION:  GENERAL:  Remarkable for middle-age white female  in no distress.  She has a prosthetic device on the right lower  extremity.  VITAL SIGNS:  Weight is 200.  Blood pressure 160/69, pulse 79 and  regular, respiratory rate  14, afebrile.  HEENT:  Unremarkable.  NECK:  Right carotid bruit. No lymphadenopathy, thyromegaly JVP  elevation.  LUNGS:  Clear with good diaphragmatic motion.  No wheezing.  CARDIAC:  S1-S2 with a soft systolic murmur.  PMI normal.  ABDOMEN:  Benign.  Bowel sounds positive.  No AAA, no tenderness, no  hepatosplenomegaly, no hepatojugular reflux.  No bruit, no tenderness.  EXTREMITIES:  Status post amputation right below-the-knee. Left leg is  fine with +2 PT, trace lower extremity edema.  NEUROLOGIC:  Nonfocal.  SKIN:  Warm and dry.  MUSCULOSKELETAL:  No muscular weakness.   IMPRESSION:  1. Coronary disease, occluded distal right with collateralization.      Continue medical therapy aspirin and beta blocker.  2. Systolic murmur.  Follow up echocardiogram in a year.  As I recall,      she has aortic sclerosis.  3. Right carotid  bruit.  Followup duplex. I have heard this bruit      intermittently, but if not noted on the duplex, we will do it in      the next couple weeks.  No TIA-like syndrome.  Continue aspirin.  4. Significant anemia.  Follow up with Dr. Nils Pyle and GI doctors. She      needs to get the results of her colonoscopy and EGD. At some point      in the future, she may need a bone marrow given her fairly      significant anemia.  5. Diabetes.  Seems to be under reasonable control.  Hemoglobin A1c      quarterly.  Follow up with Dr. Nils Pyle.  6. Neuropathic pain.  Continue narcotic analgesia. Gabapentin and      amitriptyline not very useful. She may try to wean herself off      these.   Overall, I think [name] is doing well, and I will see her back in 6  months unless her carotids show high-grade disease.     Wallis Bamberg. Johnsie Cancel, MD, [facility]  Electronically Signed    PCN/MedQ  DD: 07/13/2007  DT: 07/13/2007  Job #: [id number]

## 2010-07-30 NOTE — Assessment & Plan Note (Signed)
OFFICE VISIT   ZEN, KEEBLE  DOB:  31-Dec-1955                                        February 25, 2008  CHART #:  [id number]   CURRENT PROBLEMS:  1. Status post multivessel coronary artery bypass surgery on December 01, 2007, using left radial artery and left internal mammary      artery.  2. Type 1 diabetes mellitus with history of methicillin-resistant      Staphylococcus aureus colonization, status post right below-knee      amputation.   PRESENT ILLNESS:  The patient returns for final postop visit.  Her  wounds have now all completely healed.  She is functioning at a normal  activity level without symptoms of angina or CHF.   PHYSICAL EXAMINATION:  Vital Signs:  Blood pressure 115/60, pulse 86,  respirations 18, saturation 97% on room air.  General:  She is alert and  oriented.  Lungs:  Her breath sounds are clear bilaterally.  Cardiac:  Rhythm is regular without murmur.  The sternum is stable and now well  healed.  Extremities:  The left leg incision is healed well and she has  minimal lower extremity edema.   PLAN:  The patient returned to the care of her primary care physician  and return here as needed.   Ivin Poot, M.D.  Electronically Signed   PV/MEDQ  D:  02/25/2008  T:  02/26/2008  Job:  [id number]

## 2010-07-30 NOTE — Assessment & Plan Note (Signed)
HISTORY OF PRESENT ILLNESS:  [name] returns to the clinic today  for followup evaluation. She is a 55 year old female with a history of  diabetes mellitus along with hypertension, osteoarthritis of the right  toe with a complication after prior amputation. She had failed  conservative treatment and underwent a right below the knee amputations  on April 01, 2006 by Dr. Veverly Foster. The patient has been released  completely by Dr. Veverly Foster. She has obtained a prosthesis through [employer]  and is due to followup with them in December. She still has some  residual pain in the right lateral aspect of her residual limb, along  with phantom pain involving her right foot. The patient reports that her  recent blood sugars have been in the 88 to 139 range. Her hemoglobin A1C  is 6.0. She continues to use Neurontin 600 mg q.i.d. but reports that  the Vicodin actually helps more with her phantom pain, as best she can  tell. She continues to be followed by her primary care physician, Dr.  Nils Foster.   MEDICATIONS:  1. Glucophage 1000 mg b.i.d.  2. Zoloft 200 mg daily.  3. Elavil 25 mg daily.  4. Lipitor 80 mg daily.  5. Toprol XL 100 mg daily.  6. Accupril 20 mg daily.  7. Lasix 20 mg b.i.d.  8. Robaxin 500 mg q.6 hours p.r.n.  9. Nu-Iron 1 tablet daily.  10.Zyrtec or Claritin daily.  11.Neurontin 600 mg q.i.d.  12.NPH insulin 16 units b.i.d.  13.Hydrocodone 10/325 1 tablet q.i.d. p.r.n.   REVIEW OF SYSTEMS:  Noncontributory.   PHYSICAL EXAMINATION:  GENERAL:  A well appearing, well nourished, adult  female who ambulates with her new prosthesis with the prosthetic outer  cover in place.  NEUROLOGIC:  She has 5/5 strength throughout the bilateral upper  extremities and left lower extremity and right lower extremity strength  was 4+ to 5- over 5 in hip flexion and knee extension.   IMPRESSION:  1. Status post right below the knee amputations January 2008.  2. Insulin-dependent diabetes  mellitus, controlled.  3. Hypertension.   PLAN:  In the office today, we did refill the patient's hydrocodone as  noted above. She was unable to tolerate Oxycodone secondary to nausea  and itch. I have asked her to followup with [employer] for fitting issues  regarding that right prosthesis. I have asked her to followup with Dr.  Nils Foster regarding blood pressure management, as she has had recent  discontinuation of Maxzide, since she is on Lasix but she still has  elevation in the office today. We will plan on seeing the patient in  followup in this office in approximately 4 months time with refills of  medication prior to that appointment, as necessary.          ______________________________  [name], M.D.    DC/MedQ  D:  01/08/2007 15:24:41  T:  01/09/2007 17:51:13  Job #:  [id number]

## 2010-07-30 NOTE — Assessment & Plan Note (Signed)
[place] OFFICE NOTE   [name], [name] Foster                     MRN:          [id number]  DATE:11/10/2007                            DOB:          Aug 15, 1955    She was recently hospitalized for question of a seizure, she was on Dr.  Mikeal Hawthorne Cykert's service.  She is a complicated patient.  She has a  history of right lower extremity amputation from osteomyelitis, at that  time, we are asked to consult on her for subendocardial MI.  She clearly  was in no shape at that time to have an angiogram.  As part of her  workup, she was noted to have a bruit and subsequently required right  carotid endarterectomy from Dr. Oneida Alar.  This was a somewhat complicated  procedure and I also believe she had a bit of an infection afterwards.   She subsequently did have a heart catheterization in January of this  year.  She had a total right coronary artery with faint collaterals and  a borderline left main disease which I believe I called 60%.  She  subsequently was hospitalized in July.  She was with her son and  appeared to have a seizure.  It was not clear etiology.  Her CT was  negative and she had a negative EEG.  She bumped her enzymes again  slightly, and I was concerned that she may have had an arrhythmia.  Repeat heart catheterization done in the inpatient lab with larger  catheters by Dr. Burt Knack suggested progression of her left main disease.  I have reviewed these films, he was hesitant to put an IVUS catheter in.  The patient clearly has narrowing both at the ostium and at the distal  left main.  I would agree that the left main disease is surgical, there  also appeared to be some haziness in the distal left main.  She has  reasonable targets in the circ and LAD.  I am not sure if the right can  be bypassed.   Dr. Cyndia Bent was to see the patient on the Saturday after her heart cath  which was done on a Friday, but  the patient left AMA.   I subsequently called the patient at home and explained to her what was  going on and tried to understand why she left.  I think she was just  frustrated, there was some lack of communication between Dr. Burt Knack and  the patient and also with the primary medical service.  Unfortunately, I  had left that afternoon for a week's vacation, [name] Foster has been through a  lot in the last year and half.  She has had a right lower extremity  amputation, she has had significant brittle diabetes.  She has had the  right carotid endarterectomy and in the last 2 weeks discovered fracture  of her right wrist and now has a cast on it.  She would appear still to  be a reasonable candidate for open heart surgery.  She had a myriad of  questions today.  She was with her  father and her son, [name] Foster, who was  also extremely scared about his mother's health.  I talked to them at  length about issues regarding getting the cast off her right arm, about  arterial conduits, about need for vein mapping since she has a right  below-knee amputation, about the risks of infection during surgery.  The  bottom line is she needs to see Dr. Cyndia Bent for another consult.   Her review of systems is otherwise remarkable for pain in the right  wrist and arm where her cast is.  She has not had any fever, shortness  of breath, PND, or orthopnea.  There has been no recurrent seizure or  syncope activity.   MEDICATIONS:  1. Loratadine.  2. An aspirin a day.  3. Vitamins.  4. Ranitidine 150 a day.  5. She has not been back on her metformin since discharge.  She had      been on a gram b.i.d.  6. Lipitor 80 a day.  7. Amitriptyline 25 a day.  8. Metoprolol 100 a day.  9. Accupril 20 a day.  10.Zoloft 100 a day.  11.Insulin.   PHYSICAL EXAM:  GENERAL:  Remarkable for middle-aged white female in no  distress.  VITAL SIGNS:  Weight is 198, blood pressure 127/69, pulse 77 regular,  respiratory rate 14,  afebrile.  HEENT:  Unremarkable, status post right carotid endarterectomy.  LUNGS:  Clear.  Good diaphragmatic motion.  No wheezing.  HEART:  S1 and S2, normal heart sounds.  PMI normal.  ABDOMEN:  Benign.  Bowel sounds positive.  No AAA.  No bruit.  No  hepatosplenomegaly.  No reflux.  EXTREMITIES:  Status post right below-knee amputation.  Left lower  extremity with a +2 PT cast on the right arm.   In excess, 45 minutes was spent just showing the patient her actual  angiograms from [facility] from January and June.  Diagrams were also  drawn to explain to her the issue with left main disease in the total  right coronary artery.   IMPRESSION:  1. Left main coronary artery disease with total right coronary artery      occlusion, need for surgical consultation.  Follow with Dr. Cyndia Bent.      Continue aspirin and beta-blocker.  2. Hypertension, currently well controlled.  Continue current      medication including ACE inhibitor.  3. Diabetes, likely poor control and noncompliant with oral      hypoglycemic, reinstitute metformin.  Follow fasting and      postprandial sugars.  4. History of reflux.  Continue omeprazole 20 b.i.d.  5. Recent right carotid endarterectomy with infection.  Followup with      Dr. Oneida Alar, seems to be finally healed.  6. Recent fracture of the right wrist, not clear, but I suspect Dr.      Berdine Addison has done x-rays and this is not a pathological fracture, may      be ideal to have casts removed before proceeding with any bypass      surgery.  7. I did review these films with Dr. Burt Knack and Dr. Olevia Perches.  They both      felt that surgery was indicated and certainly this was not amenable      to stenting.    [name] Foster. [name] Cancel, MD, [facility]  Electronically Signed   PCN/MedQ  DD: 11/10/2007  DT: 11/11/2007  Job #: [id number]

## 2010-07-30 NOTE — Assessment & Plan Note (Signed)
The patient returns to the clinic today accompanied by her young son.  [name] doing fairly well overall.  She was seen in 2009 in  January for questionable chest pain and underwent a cardiac  catheterization which showed one completely blocked artery, but with  good collaterals.  She was started on isosorbide.  She reports that her  blood sugars have been in the 105 to 149 range.  She continues to use  the hydrocodone approximately 4-5 tablets per day.  She has tried some  increased walking lately, but had increased pain at the distal limb on  the right side.  She has cut back and noticed that the pain is  improving.  She still has phantom pain of her right foot and does not  feel that the Neurontin is helping.  She would like to wean off that  medication to see if it actually has any benefit for her.   MEDICATIONS:  1. Glucophage 1000 mg b.i.d.  2. Zoloft 200 mg daily.  3. Elavil 25 mg daily.  4. Lipitor 80 mg daily.  5. Toprol XL 100 mg daily.  6. Accupril 20 mg daily.  7. Lasix 20 mg b.i.d.  8. Robaxin 500 mg q.6 hours p.r.n.  9. Nu-Iron one tablet daily.  10.Zyrtec or Claritin daily.  11.Neurontin 600 mg q.i.d.  12.NPH Insulin 16 units b.i.d.  13.Hydrocodone 10/325 one tablet q.i.d. p.r.n.   REVIEW OF SYSTEMS:  Positive for high blood sugar, diarrhea, and cough.   PHYSICAL EXAMINATION:  GENERAL:  A well-appearing, well-nourished, adult  female in mild acute discomfort.  VITAL SIGNS:  Blood pressure 138/53, pulse 86, respiratory rate 18, and  O2 saturation 96% on room air.  She has 5/5 strength throughout the  bilateral upper extremities and 4+/5 strength throughout the bilateral  lower extremities.   IMPRESSION:  1. Status post right below knee amputation in January of 2008.  2. Insulin-dependent diabetes mellitus, controlled.  3. Hypertension.  4. Continued phantom pain of the right lower extremity.   In the office, we did have the patient start a wean of the  Neurontin to  600 mg three times a day for a week and then decrease to twice a day for  a week and once a day for a week to see if the Neurontin is actually  giving her any benefit.  In the meantime she will continue using the  hydrocodone and a new prescription was given to her in the office today.  We will plan on seeing her in follow-up in approximately 4-5 months time  with refills prior to that approximately as necessary.           ______________________________  Jarvis Morgan, M.D.     DC/MedQ  D:  05/03/2007 16:11:57  T:  05/04/2007 12:44:47  Job #:  [id number]

## 2010-07-30 NOTE — Assessment & Plan Note (Signed)
OFFICE VISIT   Tonya, Foster  DOB:  1955-04-12                                       10/17/2009  [id number]   The patient returns for followup today.  She was last seen on  08/29/2009.  She recently had a left superficial femoral artery stent  placed in March of 2011.  She also had partial amputation of a toe and  had an ulceration on the plantar aspect of her left foot.  She returns  for followup today.   Her wounds have completely healed at this point.  She is still doing a  wet-to-dry dressing on her foot but the epithelium is essentially intact  at this point.  She has had no further drainage.  She denies any  claudication symptoms.   REVIEW OF SYSTEMS:  She denies any shortness of breath or chest pain.   PHYSICAL EXAM:  She has a 2+ dorsalis pedis pulse left foot.  All the  wounds are completely healed at this point.   We will schedule her for a repeat duplex arterial exam to examine her  stent in the next few weeks.  We will then place her in our stent  protocol for surveillance scanning and she will follow up with me in one  year's time.     Jessy Oto. Fields, MD  Electronically Signed   CEF/MEDQ  D:  10/17/2009  T:  10/18/2009  Job:  [id number]

## 2010-07-30 NOTE — Assessment & Plan Note (Signed)
OFFICE VISIT   Foster, Tonya  DOB:  Jul 04, 1955                                       11/01/2009  [id number]   The patient was a no show for her followup appointment today.  She was  recently noted to have increased velocities in her superficial femoral  artery stent on duplex exam.  They will try to reschedule her.     Jessy Oto. Fields, MD  Electronically Signed   CEF/MEDQ  D:  11/01/2009  T:  11/02/2009  Job:  [id number]

## 2010-07-30 NOTE — Assessment & Plan Note (Signed)
[facility] OFFICE NOTE   [name], [name]                     MRN:          [id number]  DATE:01/06/2008                            DOB:          17-Jan-1956    [name] returns today for followup.  She has had a very complicated year.  She initially had an amputation due to staph infection in her right  lower extremity.  At that time, she had a subendocardial MI.  We needed  her heal better.  Catheterization at that time revealed borderline left  main disease.  Subsequently, was found to have a tight carotid lesion  and had a right carotid endarterectomy.  It was slow to heal and she was  colonized with MRSA and required prolonged antibiotics.  She  subsequently was hospitalized for question of a seizure and there was a  question of whether or not this could have been an arrhythmia.  Repeat  heart catheterization showed progression of her left main disease.  She  subsequently underwent coronary artery bypass surgery by Dr. Prescott Gum.   Despite her multiple comorbidities, this was fairly uneventful.  She was  concerned today.  She had a bit of postural at one of the chest tube  sites today.  She was treated with doxycycline in the preop.  Due to her  MRSA colonization, she has not been febrile but does have fairly poor  hygiene.   She was very grateful for Dr. Prescott Gum.  She thought he was a wonderful  doctor and he went out of his way to make her son, Chance, feel  comfortable.  She is slow to have her pain decreased.  She was asking  for more Percocet, otherwise she has not had any skin palpitations, PND,  or orthopnea.  She is using the prosthetic leg in her right side and has  been no evidence of AFib and no TIAs.   MEDICATIONS:  1. Metrazol 20 a day.  2. Imdur 30 a day.  3. aspirin a day.  4. Metformin 500 b.i.d.  5. Gabapentin 600 b.i.d.  6. Lipitor 80 a day.  7. Amitriptyline.  8. Lopressor 100 a  day.  9. Accupril 20 a day.  10.Zoloft 200 a day.  11.Novolin insulin twice a day.   PHYSICAL EXAMINATION:  GENERAL:  Exam is remarkable for, a middle-aged  white female, in no distress.  VITAL SIGNS:  Blood pressure is 110/60, pulse 80 and regular,  respiratory rate 14, afebrile.  NECK:  She has a large scar in the right neck from her endarterectomy.  Faint residual bruit up near the jaw.  No lymphadenopathy, thyromegaly,  or JVP elevation.  LUNGS:  Clear with good diaphragmatic motion.  No wheezing.  Sternum is  well-healed.  HEART:  S1 and S2 with no rub.  PMI normal.  There is a small pustule  just distal to the sternum which has some exudate in it.  ABDOMEN:  Benign.  Bowel sounds positive.  No AAA.  No tenderness.  No  bruit.  No hepatosplenomegaly or hepatojugular reflux.  No  tenderness.  EXTREMITIES:  She is status post right below-knee amputation with a  prosthetic device.  The radial harvest site from her graft has healed  well.  She has an excellent capillary refill and good ulnar pulse.   IMPRESSION:  1. Coronary artery disease, coronary artery bypass graft, doing well.      Continue aspirin and beta-blocker.  2. Methicillin-resistant Staphylococcus aureus colonization with small      postural in his chest tube site and she is already had a course of      doxycycline.  I will give her Cipro 250 b.i.d.  She had an      appointment with Dr. Prescott Gum next Friday.  He will reassess the      wound and see if she needs to have her antibiotics stopped or      changed.  3. Right carotid endarterectomy, currently stable, faint residual      bruit.  Followup duplex in 6 months.  4. Hypertension currently well controlled.  Continue current dose of      diuretic and angiotensin-converting enzyme inhibitor.  5. Status post right lower extremity amputation, prosthetic device      fitted well, ambulating.  Follow up with physical therapy.  6. Anxiety and depression.  Continue  Zoloft.  She certainly has a      reason to be upset.  She has had multiple comorbidities,      hospitalizations and major surgeries this year.  Hopefully, this      will be the end of it and she can start to live a better quality of      life again.  7. Diabetes follow up with Dr. Sanda Linger.  Continue insulin and      metformin, hemoglobin A1c quarterly.      Wallis Bamberg. Johnsie Cancel, MD, [facility]  Electronically Signed    PCN/MedQ  DD: 01/06/2008  DT: 01/07/2008  Job #: [id number]   cc:   Ivin Poot, M.D.

## 2010-07-30 NOTE — Assessment & Plan Note (Signed)
OFFICE VISIT   KELBIE, DOENGES  DOB:  1955/12/21                                       07/25/2009  [id number]   The patient returns for followup today complaining of pain in her left  foot and the fact she ran out of pain medicine.  She previously  underwent angioplasty and stenting of the left superficial femoral  artery on March 31.  She denies any fever or chills.  She was given a  prescription for 60 Percocet 15 days ago.   PHYSICAL EXAM:  Today the left foot is pink, warm and well-perfused.  There is good granulation tissue at the base of the second toe  amputation as well on the plantar wound on the bottom of her foot.  All  these appear healthy with no surrounding cellulitis or purulent  drainage.   The patient was reassured today.  She was given an additional  prescription of Percocet 30 dispensed.  She should not need any further  prescriptions until her office appointment of May 25.  I informed her of  this today.  I also informed her we will need to begin tapering her  chronic pain medication.  She has a significant neuropathy component as  well and is currently on Neurontin but cannot increase her dose  secondary to confusion.  If she continues to have pain issues we will  need to consider a pain management specialist in the future.     Jessy Oto. Fields, MD  Electronically Signed   CEF/MEDQ  D:  07/25/2009  T:  07/26/2009  Job:  [id number]

## 2010-07-30 NOTE — Assessment & Plan Note (Signed)
OFFICE VISIT   [name], [name]  DOB:  1955/06/15                                       11/03/2007  [id number]   The patient returns for followup today.  She underwent a right carotid  endarterectomy 08/26/2007.  She was last seen 2 weeks ago after having  some drainage from her incision.  This was fairly superficial in nature;  after the skin was removed from the drainage site there was some serous  material drained and culture for this had a few colonies of MRSA.  I am  pleased to report on exam today this drainage has totally ceased and the  incision is well-healed.  She currently is on doxycycline and Cipro.  I  believe we should continue these for at least 1 more month since this  culture grew out MRSA otherwise she is doing well.  She will see me in 1  month's time.   Jessy Oto. Fields, MD  Electronically Signed   CEF/MEDQ  D:  11/04/2007  T:  11/04/2007  Job:  [id number]

## 2010-07-30 NOTE — Assessment & Plan Note (Signed)
OFFICE VISIT   CHARLIEROSE, STAGNITTA  DOB:  [birth year]                                       06/06/2009  [id number]   [name] a 55 year old female that I have followed for some time  for peripheral arterial occlusive disease in her left lower extremity as  well as her carotids.  She has previously had a right below-knee  amputation.  She was recently admitted to the hospital for sepsis in her  right foot.  At that time she had amputation of her left second toe.  She presents today for evaluation of healing in her left foot.  She  denies any fever or chills.  She is currently on Keflex.  She apparently  saw Dr. Shellia Carwin early this week and he stated that her foot looked  improved.   CHRONIC MEDICAL PROBLEMS:  Include diabetes, hypertension, coronary  artery disease and peripheral arterial disease.  These are all currently  stable.   HISTORY:  Remarkable for her father who had vascular disease at age 66.   SOCIAL HISTORY:  She is married.  She has 2 children.  She is a  nonsmoker, nonconsumer of alcohol.   Full review of systems was performed on the patient today.  Please see  intake referral form for details regarding this.   This foot shows a 3.7 x 2.1  cm darkened eschar on the plantar aspect of  her left foot.  There is some surrounding erythema across the forefoot  and in the central aspect of the plantar aspect of her foot.  The  patient and her family both state that this erythema is resolving and  improving.  She has an open area at the second toe amputation site.  There is no expressible pus through this.  There is some evidence of  granulation at the tip.   Although apparently the patient's clinical situation with her left foot  is improving,  I still have some concerns that she may have deep seated  tissue infection or possibly osteomyelitis in the foot due to the  erythema on the plantar aspect of her foot and the eschar in this  area.  I believe the best option for her would be to get an MRI scan to rule  out any deep-seated infection in her foot.  I have also added Levaquin  to her antibiotic regimen.  She does have questions today regarding the  medication.  I told her there is some increased risk of Achilles tendon  rupture with this medication but overall should be fairly well  tolerated.  She will be on both of these antibiotics at least until I  see her back next Wednesday to review her MRI scan.  I did review her  today that she is at risk of limb loss if there is deep-seated  infection.  Also if the foot is not continuing to heal and progress then  we may need to consider revascularization of her left leg.  Will make  further decisions regarding this at her office appointment next week.  She will return sooner or call us if she has any progression of the  infection in her left foot and I expressed to her the seriousness of  this.     Jessy Oto. Fields, MD  Electronically Signed   CEF/MEDQ  D:  06/06/2009  T:  06/07/2009  Job:  [id number]   cc:   Tarri Glenn, M.D.

## 2010-07-30 NOTE — Procedures (Signed)
CAROTID DUPLEX EXAM   INDICATION:  Follow up known carotid artery disease.  Limited study.   HISTORY:  Diabetes:  Yes.  Cardiac:  No.  Hypertension:  Yes.  Smoking:  No.  Previous Surgery:  Right below-knee amputation.  CV History:  Amaurosis Fugax No, Paresthesias No, Hemiparesis No                                       RIGHT             LEFT  Brachial systolic pressure:  Brachial Doppler waveforms:  Vertebral direction of flow:        Antegrade  DUPLEX VELOCITIES (cm/sec)  CCA peak systolic                   80  ECA peak systolic                   [id number]  ICA peak systolic                   [id number]  ICA end diastolic                   292  PLAQUE MORPHOLOGY:                  Calcified  PLAQUE AMOUNT:                      Severe  PLAQUE LOCATION:                    ICA, ECA   IMPRESSION:  1. Limited study.  2. 80-99% stenosis noted in the right internal carotid artery.  3. Antegrade right vertebral artery.   ___________________________________________  Jessy Oto Fields, MD   MG/MEDQ  D:  07/28/2007  T:  07/28/2007  Job:  [id number]

## 2010-07-30 NOTE — Assessment & Plan Note (Signed)
OFFICE VISIT   [name], [name]  DOB:  November 02, 1955                                       08/08/2009  [id number]   The patient returns for followup today.  She underwent left superficial  femoral artery stenting in March of this year.  She subsequently  underwent completion amputation of her left third toe and debridement of  her left foot.  She returns today for further followup.  She is  currently doing normal saline wet-to-dry dressing changes on her foot.  She denies any fever or chills.  She has had a small amount of drainage  from the toe amputation site.   PHYSICAL EXAM:  Blood pressure is 144/70 in the left arm, heart rate is  86 and regular.  The left foot has a 2 x 1 cm ulceration on the plantar  aspect of her foot which is clean and has good granulation tissue at the  base.  The toe amputation site on the dorsal aspect of her left foot had  some fibrinous debris and this was all debrided away sharply today back  to good healthy bleeding tissue.  The wound is approximately 2 x 2 cm in  diameter.  There was good bleeding from the tissues.  Overall her wounds  appear to be continuing to contract.  She still has considerable pain in  the wounds at times.  We are trying to taper her off her narcotics.  She  was given a prescription today for Percocet #20 dispensed.  We also  renewed her Zoloft, metoprolol, Accupril and Lipitor medications today.  She will follow up in two weeks' time to recheck her wounds.  She will  continue to taper her off her Percocet over time.     Jessy Oto. Fields, MD  Electronically Signed   CEF/MEDQ  D:  08/08/2009  T:  08/09/2009  Job:  [id number]

## 2010-07-30 NOTE — Procedures (Signed)
LOWER EXTREMITY ARTERIAL DUPLEX   INDICATION:  Follow up left SFA and popliteal stents.   HISTORY:  Diabetes:  Yes.  Cardiac:  MI.  Hypertension:  Yes.  Smoking:  No.  Previous Surgery:  Left SFA and popliteal stents on 06/19/2009 by Dr.  Trula Slade.   SINGLE LEVEL ARTERIAL EXAM                          RIGHT                LEFT  Brachial:               179                  Not taken  Anterior tibial:                             175  Posterior tibial:                            125  Peroneal:  Ankle/Brachial Index:   BKA                  0.98   LOWER EXTREMITY ARTERIAL DUPLEX EXAM   DUPLEX:  Patent left lower extremity arterial study with biphasic  waveforms noted.   IMPRESSION:  1. Left ankle brachial index has increased from previous study and is      suggestive of mild arterial disease.  2. Patent left lower extremity superficial femoral artery and      popliteal stents.  3. Velocity measurements attached.   ___________________________________________  Jessy Oto. Oneida Alar, MD   EM/MEDQ  D:  01/03/2010  T:  01/03/2010  Job:  [id number]

## 2010-07-30 NOTE — Assessment & Plan Note (Signed)
OFFICE VISIT   CASSITY, DEPOLO  DOB:  03-Feb-1956                                       09/15/2007  [id number]   The patient returns for followup today.  She underwent right carotid  endarterectomy on June 11.  She had a fairly high carotid bifurcation  requiring an extensive mobilization of the hypoglossal nerve and did  have some hypoglossal palsy on discharge from the hospital.  She had  stated that in the last 2 weeks she has developed some increased  swelling in the right neck.  She also has had some clear drainage from  her neck incision.  She denies any fevers or chills.  She states she is  still having some mild difficulty swallowing primarily due to the  swelling in her neck.   PHYSICAL EXAM:  Vital signs:  Blood pressure is 119/73 in the right arm,  93/61 in the left arm, pulse is 87 and regular.  Neck:  Exam shows a  well approximated right neck incision.  There is no significant  surrounding erythema.  There is a bulge in the right side of the neck  which is approximately 3 x 3 cm in diameter.  This is firm and  nonpulsatile.  There is a small amount of serous drainage from the neck.  It does not appear to be purulent.  Neurological:  Exam shows symmetric  upper extremity and lower extremity motor strength.  She does have some  mild right tongue deviation.  Her voice is slightly muffled from  swelling.  Swallow is intact.   She had a carotid duplex exam today which showed a widely patent right  internal carotid artery.  She had a 2 x 3 cm hypoechoic mass adjacent to  the carotid bifurcation.   I believe the patient has developed some hematoma postoperatively.  Hopefully this will improve with conservative measures alone.  She will  begin placing ice packs on this a few times a day to help with pain.  She was also given a new prescription for Percocet today.  I also placed  her on Keflex four times a day.  She will follow up with me in  two  weeks' time or sooner if the swelling is becoming worse.  Hopefully this  will resolve within the next few weeks.  I did discuss with her today  the possibility that the hematoma can get infected but hopefully the  antibiotics would reduce that risk.   Jessy Oto. Fields, MD  Electronically Signed   CEF/MEDQ  D:  09/15/2007  T:  09/16/2007  Job:  [id number]   cc:   Wallis Bamberg. Johnsie Cancel, MD, [facility]

## 2010-07-30 NOTE — Assessment & Plan Note (Signed)
OFFICE VISIT   [name], [name]  DOB:  [birth year]                                       07/28/2007  [id number]   [name] a 55 year old female referred by Dr. Johnsie Cancel for  evaluation of asymptomatic high-grade carotid stenosis.  She recently  had a carotid duplex exam for asymptomatic bruit.  This was shown to  have an occlusion of the left internal carotid artery and a high-grade  right internal carotid artery stenosis.  The patient denies any prior  history of TIA, amaurosis or stroke.  She has no family history of  stroke.  Her atherosclerotic risk factors include diabetes,  hypertension, elevated cholesterol.  She has had diabetes for  approximately 20 years.  She is a nonsmoker.   PAST SURGICAL HISTORY:  She had a right below knee amputation in January  of 2008.   FAMILY HISTORY:  Remarkable for her father and sisters both of which had  vascular disease at a young age.   SOCIAL HISTORY:  She is married.  She has two children.  She is a  nonsmoker and nonconsumer of alcohol.   REVIEW OF SYSTEMS:  She has had some recent weight gain.  She also has  had some occasional chest pain.  She had a recent cardiac  catheterization which showed occlusion of one coronary artery with  collateralization.  Pulmonary, GI, renal, neurologic, psychiatric,  hematologic review of systems were all negative.  HEENT:  She has had some recent decline in hearing.  ORTHOPEDIC:  She has multiple joint arthritis pain.   MEDICATIONS:  1. Include aspirin once a day.  2. Glucophage 500 mg two times a day.  3. Insulin 35 units two times a day.  4. Lipitor 60 mg once a day.  5. Imdur once daily.  6. Accupril 20 mg once a day.  7. Metoprolol 50 mg two times a day.  8. Lasix 420 mg once a day.  9. Zoloft 100 mg twice a day.  10.Amitriptyline 25 mg once a day.  11.Neurontin 600 mg once a day.   ALLERGIES:  She has no known drug allergies.   PHYSICAL EXAM:   Vital signs:  Blood pressure is 114/68 in the left arm,  144/73 in the right arm, pulse is 85 and regular.  HEENT:  Unremarkable.  Neck:  Has 2+ carotid pulses.  She has bilateral carotid bruits right  greater than left.  Chest:  Clear to auscultation.  Cardiac:  Regular  rate and rhythm without murmur.  Abdomen:  Is obese, soft, nontender,  nondistended.  No masses.  Extremities:  She has 1+ brachial and radial  pulses.  She has a 1+ left femoral pulse.  She has a 1+ right femoral  pulse.  She has a right below-knee amputation which is well-healed.  She  has a 1+ left popliteal and a 1+ dorsalis pedis pulse on the left side.  There is a small callus on the left first toe with no opening in the  skin.  Neurologic:  Exam shows symmetric 5/5 upper extremity motor  strength.  Lower extremities are also 5/5.  She has no pronator drift.   She had a carotid duplex scan performed at [facility] on 07/22/2007.  This  showed an 80-99% right internal carotid artery stenosis with occlusion  of the left internal carotid  artery.  Vertebral arteries were antegrade  bilaterally.  We repeated her carotid duplex exam today in our lab to  determine the anatomical location of the plaque.  This again showed  occlusion of the left internal carotid artery.  She had an 80-99% right  internal carotid artery stenosis with a peak systolic velocity of XX123456 cm  per second and an end-diastolic velocity of 123456 cm per second.  Plaque  was fairly calcified.  There was good normal distal in her carotid  artery past the level of stenosis.  Carotid bifurcation was at the hyoid  notch.   In summary, the patient has a high-grade right internal carotid artery  stenosis with contralateral carotid occlusion.  Fortunately she does not  seem to have any neurologic events at this point.  However, I informed  her she is at high risk for neurologic events in the near future with  this high-grade stenosis and contralateral occlusion.  I  believe she  would benefit from right carotid endarterectomy.  She was informed of  the procedure details, risks, benefits and possible complications.  This  included but was not limited to bleeding, infection, cranial nerve  injury, stroke risk of 1-2%.  She understands and agrees to proceed.  Right carotid endarterectomy was scheduled for June 11 as she wanted to  defer this until after her child was out of school.  I did inform her  that if she has any neurologic symptoms that she would need to be  scheduled sooner.  Otherwise it was critically important that she  continue to take her aspirin daily.   Jessy Oto. Fields, MD  Electronically Signed   CEF/MEDQ  D:  07/29/2007  T:  07/29/2007  Job:  [id number]   cc:   Wallis Bamberg. Johnsie Cancel, MD, [facility]

## 2010-07-30 NOTE — Assessment & Plan Note (Signed)
OFFICE VISIT   [name], [name]  DOB:  July 05, 1955                                       07/11/2009  [id number]   The patient returns for follow-up today.  She underwent completion  amputation of her left third toe with resection of metatarsal head and  debridement of the plantar aspect of her left foot on March 31.  During  that same hospital admission she underwent angioplasty and stenting of  her left SFA popliteal segment by Dr. Trula Slade IV.  She has been doing  local wound care with normal saline wet-to-dry dressings and she has  been wearing a shoe that has a cutout over the plantar ulcer.  On exam  today her foot is pink, warm and well-perfused.  She does still have  some tenderness over the amputation site.  Some fibrinous debris was  debrided from the third toe amputation site as well as the bottom of the  foot.  Overall these wounds are granulating well and seemed to be  healing at this point.  She does have some neuropathic component.  She  had ABI performed today which was 1.05 on the left compared to 0.62  previously.  Overall she appears to be doing well at this point.  She  still may have some residual osteomyelitis but the bone was of good  quality at the time of amputation.  Hopefully this will continue to  heal.  She dud not have any evidence of infection that I thought  required antibiotics at this point.  She will continue local wound care.  She will follow up in 1 month.  She is given a prescription for Percocet  #60 dispensed today.  She should not need any further refills after  this.     Jessy Oto. Fields, MD  Electronically Signed   CEF/MEDQ  D:  07/11/2009  T:  07/12/2009  Job:  [id number]

## 2010-07-30 NOTE — Assessment & Plan Note (Signed)
OFFICE VISIT   [name], [name]  DOB:  05/23/1955                                       09/29/2007  [id number]   The patient returns for followup today.  She underwent a right carotid  endarterectomy on June 11.  She had a hematoma in her neck  postoperatively with some drainage.  She presents today for further  followup.   PHYSICAL EXAMINATION:  On exam today the hematoma is essentially  resolved.  There is still some fullness on the right neck but overall  this looks dramatically improved.  Her tongue is more to the midline  although she states that she still has some swallowing problems this is  overall improved.  There is no erythema.  She finished her course of  Keflex yesterday.  Blood pressure today was 139/74 in the right arm and  96/62 in the left arm, heart rate is 83 and regular.   The patient seems to be improving.  However, she still is having a small  amount of drainage and I believe she needs to be kept on antibiotics  until this completely ceases.  I have placed her on doxycycline today  for 2 weeks since she did not like the Keflex.  She was also given a  prescription for 30 more Percocet today.  She will follow up in 2 weeks'  time.   Jessy Oto. Fields, MD  Electronically Signed   CEF/MEDQ  D:  09/29/2007  T:  09/30/2007  Job:  [id number]

## 2010-07-30 NOTE — Assessment & Plan Note (Signed)
[facility] OFFICE NOTE   Tonya, Foster                     MRN:          [id number]  DATE:03/24/2007                            DOB:          [birth year]    HISTORY:  [name] is seen today in followup.  Unfortunately, she  was lost to followup for a little while.  She is a diabetic with  significant vascular disease.  She had a right lower extremity  amputation in April.  She had an abnormal Myoview with significant  inferolateral wall ischemia in May.  She was to have a heart cath.  The  patient did not want to have it.  Initially, she was just going through  rehab and had her prosthetic device fitted.  She really wanted to get  ambulatory again.  She had a prolonged hospitalization at [facility] for staph  sepsis after her amputation and did not want to go back to the hospital.  I told her I understood all this, however, last week she had an episode  of chest pain radiating down her arm with dyspnea.  She said she heard  the death rattles with wheezing.  She is very concerned about this and  came back in.  I told her it very well could have been an anginal  equivalent.  She continues to have some exertional pain in her chest.  [name] willing to have her heart cath now.   REVIEW OF SYSTEMS:  Remarkable for no previous history of DVT or PE.  She is not an asthmatic.  She has been compliant with her meds.  She has  not had any hypoglycemic reactions.  Review of systems otherwise  negative.   MEDICATIONS:  1. Loratadine 10 mg a day.  2. Aspirin a day.  3. Multivitamins.  4. Ranitidine 150 a day.  5. Metformin 500 b.i.d.  6. Gabapentin 600 mg in the morning and at night.  7. Lipitor 80 a day.  8. Amitriptyline 25 q.h.s.  9. Metoprolol 100 a day.  10.Lasix 20 b.i.d.  11.Accupril 20 a day.  12.Zoloft 100-200 a day.  13.Novolin-N 35 b.i.d.   ALLERGIES:  NO KNOWN ALLERGIES.   PHYSICAL  EXAMINATION:  GENERAL:  Remarkable for a jovial middle-aged  white female in no distress.  VITAL SIGNS:  Weight is 194, blood pressure is 122/65, pulse 75 and  regular, afebrile.  HEENT:  Unremarkable.  NECK:  She has a right carotid bruit.  No lymphadenopathy, thyromegaly  or JVP elevation.  LUNGS:  Clear diaphragmatic motion.  No wheezing.  HEART:  S1-S2 normal heart sounds.  PMI normal.  ABDOMEN:  Benign.  Bowel sounds are positive.  No bruit.  No AAA.  No  hepatosplenomegaly or hepatojugular reflux.  EXTREMITIES:  Distal pulses intact on the left.  She is status post  right below-knee amputation with a prosthetic device.  She has  peripheral neuropathy in the left lower extremity.  SKIN:  Otherwise warm and dry.   DIAGNOSTICS:  Her EKG today shows sinus rhythm with no acute changes.   IMPRESSION:  1. Chest pain episode in a diabetic with an abnormal Myoview.  Cath to      be scheduled.  Risks discussed with her and also her son, Chance,      who is very smart for a boy his age.  The risk of stroke, need for      emergency surgery, dye reaction, bleeding were discussed and she is      willing to proceed.  2. Hypercholesterolemia in the setting of diabetes.  Continue Lipitor      80 a day.  Lipid and liver profile to be checked with pre-cath lab      work.  3. Hypertension, currently well controlled.  Continue Accupril      particularly for protein spurring effects given her diabetes.  4. Status post right lower extremity amputation.  Previous sepsis      resolved.  Eschar well healed.  No evidence of osteomyelitis.      Walking well with prosthetic device.  5. Diabetes.  Check hemoglobin A1c.  Hold metformin prior to cath.      Cover with sliding scale.  6. Episode of wheezing or dyspnea may be related to allergy.  Further      recommendations will be based on the results of her heart cath.      Continue Claritin 10 mg p.r.n.  No indication for inhaler at this      time.  7.  Anxiety/depression, particularly from prolonged hospitalization      with her amputation.  Continue Zoloft 200 mg a day.  Attitude and      mentation seem much better now that she is walking with her      prosthetic device.   I told Cayden that I would not start Plavix on her since she is a  diabetic and may have surgical disease.  I think there is a high  likelihood that she will be admitted for PTCA and stenting.     Wallis Bamberg. Johnsie Cancel, MD, [facility]  Electronically Signed    PCN/MedQ  DD: 03/24/2007  DT: 03/24/2007  Job #: [id number]

## 2010-07-30 NOTE — Op Note (Signed)
NAMEJUREA, CECCARELLI              ACCOUNT NO.:  [id number]   MEDICAL RECORD NO.:  [id number]          PATIENT TYPE:  INP   LOCATION:  3301                         FACILITY:  [facility]   PHYSICIAN:  Jessy Oto. Fields, MD  DATE OF BIRTH:  November 05, 1955   DATE OF PROCEDURE:  08/26/2007  DATE OF DISCHARGE:                               OPERATIVE REPORT   PROCEDURE:  Right carotid endarterectomy.   PREOPERATIVE DIAGNOSIS:  High-grade asymptomatic right common carotid  artery stenosis.   POSTOPERATIVE DIAGNOSIS:  High-grade asymptomatic right common carotid  artery stenosis.   ANESTHESIA:  General.   ASSISTANT:  Chad Cordial, PA   OPERATIVE FINDINGS:  1. Greater than 80% right internal carotid artery stenosis.  2. Dacron patch.  3. 8-French shunt  4. High carotid bifurcation.   OPERATIVE DETAILS:  After obtaining informed consent, the patient was  taken to the operating room.  The patient was placed in supine position  on the operating table.  After induction of general anesthesia and  endotracheal intubation, a Foley catheter was placed.  Next, the  patient's entire neck and chest were prepped and draped in usual sterile  fashion.  An oblique incision was made on the right neck just anterior  to the border of the right sternocleidomastoid muscle.  Dissection was  carried down through subcutaneous tissues until the platysma.  The  sternocleidomastoid muscle was reflected laterally.  Dissection was  carried along the anterior border of this.  The jugular vein was  identified.  Common facial vein branches were dissected free  circumferentially, and ligated and divided between silk ties.  The  common carotid artery was dissected free circumferentially.  Vagus nerve  was identified and protected.  Umbilical tape was placed around the  common carotid artery.  Dissection was then carried up to the level of  carotid bifurcation.  The patient had a fairly high carotid bifurcation.  To  mobilize an area of normal appearing internal carotid artery.  This  required dissection of the ansa cervicalis all the way up to the level  of the hypoglossal nerve.  This was then divided at its origin from the  hypoglossal nerve.  The occipital branch of the external carotid artery  was also divided.  Also, a portion of the posterior belly of the  digastric muscle was divided.  This allowed adequate exposure of the  internal carotid artery above the level of the hypoglossal nerve and at  a more normal-appearing segment.  The external carotid artery was  dissected free circumferentially and a vessel loop placed around this.  The superior thyroid artery was identified and a vessel loop placed  around this.  A vessel loop was also placed around the distal internal  carotid artery at this higher portion of the neck.  Next, the patient  was given 7000 units of intravenous heparin.  A vessel loop was used to  control the distal internal carotid artery.  The external carotid and  superior thyroid arteries were controlled with vessel loops.  The common  carotid arteries controlled with peripheral DeBakey clamp.  Longitudinal  opening was made in the carotid artery just below the carotid  bifurcation.  The arteriotomy was then extended up into the internal  carotid artery.  There was a high-grade stenosis greater than 80%, was  calcified plaque.  Arteriotomy was extended past the level of the plaque  which was up to, but not above the level hypoglossal nerve.  An 8-French  shunt was then brought to the operative field.  This was threaded into  the distal internal carotid artery and allowed to back bleed thoroughly.  This then threaded down into the common carotid artery.  This was  secured with a Rumel tourniquet.  Shunt was inspected, found be free of  air and unclamped with restoration of flow to the brain after  approximately 4 minutes.  Next, an endarterectomy was begun in a  suitable plane  near the carotid bifurcation.  Endarterectomy was carried  up to the level of the external carotid artery and this was  endarterectomized by eversion technique.  Good distal and proximal  endpoint was obtained in the internal carotid artery and common carotid  artery.  The plaque was sent as specimen.  All loose debris was then  removed from the carotid artery.  A Dacron patch was then brought up in  the operative field and sewn as patch angioplasty using running 6-0  Prolene suture.  Just prior to completion of the anastomosis, everything  was fore bled back bled and thoroughly flushed.  Anastomosis was carried  after removing the shunt.  Flow was first restored to the external  carotid artery after approximately 5 cardiac cycles to the internal  carotid artery.  There is good Doppler flow in the internal, external  and common carotid arteries.  Hemostasis was obtained.  Platysma muscle  was reapproximated using running 3-0 Vicryl suture.  Skin was closed  with 4-0 Vicryl subcuticular stitch.  The patient tolerate the procedure  well and there were no complications.  Instrument, sponge, and needle  counts were correct at the end of the case.  The patient was taken to  the recovery room in stable condition.      Jessy Oto. Fields, MD  Electronically Signed     CEF/MEDQ  D:  08/26/2007  T:  08/27/2007  Job:  [id number]

## 2010-07-30 NOTE — Procedures (Signed)
LOWER EXTREMITY ARTERIAL DUPLEX   INDICATION:  Follow up left SFA and popliteal stents.   HISTORY:  Diabetes:  Yes.  Cardiac:  MI.  Hypertension:  Yes.  Smoking:  No.  Previous Surgery:  Left SFA and popliteal stents on 06/19/09 by Dr.  Trula Slade.   SINGLE LEVEL ARTERIAL EXAM                          RIGHT                LEFT  Brachial:               126                  Not taken  Anterior tibial:                             60  Posterior tibial:                            Not detected  Peroneal:  Ankle/Brachial Index:   BKA                  0.48   LOWER EXTREMITY ARTERIAL DUPLEX EXAM   DUPLEX:  Patent left lower extremity with monophasic waveforms noted.  There are multiple areas of focal stenoses in the left lower extremity.  There are elevated velocities of 340 cm/s at mid to distal SFA and 576  cm/s at distal SFA.   IMPRESSION:  1. Left ankle brachial index has dropped and suggests severe arterial      disease.  2. Left posterior tibial artery is not detected.  3. Patent left lower extremity superficial femoral artery and      popliteal stents with elevated velocities as described above.         ___________________________________________  Jessy Oto. Fields, MD   CB/MEDQ  D:  10/31/2009  T:  10/31/2009  Job:  [id number]

## 2010-08-02 NOTE — H&P (Signed)
NAME:  [name], [name]:  [id number]   MEDICAL RECORD NO.:  [id number]                    PATIENT TYPE:   LOCATION:                                       FACILITY:   PHYSICIAN:  Kipp Brood. Gioffre, M.D.             DATE OF BIRTH:  1955-11-16   DATE OF ADMISSION:  DATE OF DISCHARGE:                                HISTORY & PHYSICAL   HISTORY OF PRESENT ILLNESS:  The patient was seen in our office today for  acute care clinic.  She was sent in consultation from [facility].  The patient has a history of a cellulitis in her right foot, osteomyelitis  in her right second toe.  She has been receiving IV antibiotics through a  PICC line, Rocephin 1 g daily, and clindamycin q.i.d. for the past two  weeks.  She does not seem to be responding to the medication, and it was  decided that she needed to see an orthopaedist today.  The patient was  examined in our office, and it was felt that she has gangrenous infection in  her second toe right foot.  X-rays show osteomyelitis in the proximal and  mid phalanx, and she will be scheduled for an amputation tomorrow.   MEDICATIONS:  1. Rocephin 1 g IV.  2. Clindamycin, dose unknown, q.i.d.  3. Tylenol No. 3, dose unknown.  4. Insulin 70/30 30 units q.a.m. and 30 units q.p.m.  5. Glucophage 500 mg b.i.d.  6. Prinivil 5 mg one q.a.m.  7. Zoloft 100 mg h.s.  8. Klonopin 1 mg h.s.  9. Zocor 40 mg q.a.m.   ALLERGIES:  No known drug allergies.   PAST MEDICAL HISTORY:  1. Insulin-dependent diabetes mellitus.  2. Anemia.  3. Hypertension.  4. Hypercholesterolemia.  5. Depression.   SOCIAL HISTORY:  [name] married, husband accompanies her in the  office today.   REVIEW OF SYMPTOMS:  The patient denies weight change, fevers, chills,  fatigue.  HEENT:  Denies visual changes, headaches, tinnitus, hearing loss,  sore throat.  CARDIOVASCULAR:  Denies chest pain, palpitations, shortness of  breath,  hypertension.  PULMONARY:  Denies dyspnea, wheezing, cough, sputum  production, or hemoptysis.  GASTROINTESTINAL:  Denies dysphagia, nausea,  vomiting, hematemesis, or abdominal pain.  GENITOURINARY:  Denies frequency,  dysuria, urgency, incontinence, hematuria.  ENDOCRINE:  Denies polydipsia,  polyuria, appetite change, heat or cold intolerance.  MUSCULOSKELETAL:  Does  not describe any joint pain, swelling, stiffness.  NEUROLOGIC:  Denies  dizziness, vertigo, syncope, or seizures.  SKIN:  The patient does have  erythema, swelling, and drainage from her second toe right foot.   PHYSICAL EXAMINATION:  VITAL SIGNS:  Temperature 100.2, blood pressure  148/80, pulse 96, respirations 18.  HEENT:  Pupils equal, round, reactive to light.  Extraocular movements were  intact.  Tympanic membranes clear.  Pharynx clear.  NECK:  Supple without  masses.  LUNGS:  Clear to auscultation bilaterally.  HEART:  Regular rate and rhythm without murmur.  ABDOMEN:  Positive bowel sounds, soft, nontender, no organomegaly or  abnormal masses.   ASSESSMENT:  Gangrenous infection of the right second toe with  osteomyelitis.   PLAN:  The patient will be admitted to [facility] to undergo an  amputation of her right second toe on Wednesday, 04/06/02.     Elby Showers. Paitsel, P.A.                     Ronald A. Gladstone Lighter, M.D.    [id number]  D:  04/05/2002  T:  04/05/2002  Job:  [id number]

## 2010-08-02 NOTE — Consult Note (Signed)
NAMELAITYN, HEINZERLING              ACCOUNT NO.:  [id number]   MEDICAL RECORD NO.:  [id number]          PATIENT TYPE:  INP   LOCATION:  3036                         FACILITY:  [facility]   PHYSICIAN:  Ernestine Mcmurray, MD,FACC DATE OF BIRTH:  December 19, 1955   DATE OF CONSULTATION:  03/31/2006  DATE OF DISCHARGE:                                 CONSULTATION   REFERRING PHYSICIAN:  [facility].   PRIMARY CARE PHYSICIAN:  Previously unassigned.   REASON FOR CONSULTATION:  Preoperative cardiac evaluation prior to  possible foot surgery for osteomyelitis.   HISTORY OF PRESENT ILLNESS:  [name] a 55 year old female  resident of [place] with known diabetes mellitus.  The patient has  been admitted with osteomyelitis on the right lateral aspect of the  foot.  The patient also has a prior history of myelitis with second toe  amputation January of 2004 and third toe amputation on the right in  December 2006.  She, however, has no prior cardiac history. ABIs  obtained today in the hospital did show that the patient has an ABI of  0.65 on the right with 0.97 in the left leg.  She was also admitted with  profound anemia and hyponatremia as well as fever and chills.   The patient does not report a prior history of substernal chest pain.  However, she has been nonambulatory due to her right foot cellulitis for  several months and is unable to realize any exertions symptoms.  She is  brought by her father from [place] to [place] for further medical  care.  He noticed that she also developed increased weakness and  fatigue.  Reportedly, she also has had cough associated with dyspnea.  On admission, the patient's temperature is 101.1.   We have now been asked to clear the patient prior to possible surgery  including possible amputation of the right foot.  Electrocardiogram on  admission demonstrated sinus tachycardia in the setting of significant  anemia with nonspecific ST-T wave  changes.  The patient's heart rate was  101 beats per minute.  Followup EKG on January 14 demonstrated similar  findings.  [name] status post blood transfusion and is more  comfortable.   MEDICATIONS:  1. __________ 40 mg  q. 24.  2. Gabapentin p.o. 3 times a day  3. Amitriptyline 25 nightly.  4. Sertraline.  5. Atorvastatin 80 mg p.o. nightly.  6. Metoprolol XL 100 mg p.o. daily.  7. Lisinopril 20 mg p.o. daily.  8. Sudafed 120 mg p.o. b.i.d.  9. Lantus insulin.  10.Furosemide 20 mg IV x1.  11.Zosyn.   PAST MEDICAL HISTORY:  1. Hypertension.  2. Depression.  3. Diabetes mellitus.  4. Hypercholesterolemia.  5. Osteomyelitis.   ALLERGIES:  No known drug allergies.   FAMILY HISTORY:  Notable for coronary artery disease in the patient's  father.  The patient's father underwent a carotid stent as well as first  angioplasty at [facility] by Dr. Olevia Perches.  The patient's sister  also has a history of myocardial infarction secondary to spasm, no  obstructive coronary  artery disease.   SOCIAL HISTORY:  The patient does not smoke; she does not drink.  She is  married and lives with her two children.  She lives in [place].   REVIEW OF SYSTEMS:  As per HPI.  Negative for nausea or vomiting.  Positive for fevers on admission but no chills.  Positive for foot pain.  No hematochezia, no dysuria or frequency.  No chest pain, orthopnea,  PND.  Positive for cough.  No neurological symptoms but positive for  dizziness and weakness.  Remainder of Review of Systems is negative and  reviewed as above.   PHYSICAL EXAMINATION:  VITAL SIGNS: Blood pressure 120/77, heart rate 86  beats per minute, temperature 101.2 maximum.  Respirations 18.  Saturation 99% on room air.  GENERAL:  Obese white female in no apparent distress.  HEENT:  Pupils isochoric.  Oropharynx clear.  NECK:  Supple.  Normal carotid upstroke, no carotid bruits.  LUNGS: Clear breath sounds bilaterally.   HEART: Regular rate and rhythm.  Tachycardic but no pathological  murmurs.  ABDOMEN: Soft and nontender.  No rebound, no guarding.  Good bowel  sounds.  EXTREMITIES:  No cyanosis or clubbing.  There is swelling in the right  lower extremity which was warm to the touch. Bandage has been applied to  the right foot, and this was not examined by me.  Femoral pulses are 2+  and intact, and there are no definite bruits.  NEUROLOGIC:  Grossly nonfocal.   LABORATORY DATA:  Sodium 128, potassium 4.2, chloride 93, carbon dioxide  24, glucose 137, creatinine 0.84.  White count 15.3, hemoglobin 9.4,  hematocrit 28.7, MCV 73.7, RDW 25.53, platelet count 415.  Initial  hemoglobin on admission was 6.4.  Admission sodium was 122, albumin 2.3,  AST 126, ALT 187, alkaline phosphatase 183, total bilirubin 1.4.  Lipid  panel: HDL 23, LDL 47, triglycerides 87.  Ferritin 25.   Chest x-ray: Mild cardiomegaly with no infiltrates.   PROBLEM LIST:  1. Microcytic anemia, rule out iron-deficiency anemia.  2. Osteomyelitis, right foot.  3. Vascular insufficiency.  4. Elevated liver function tests.  5. Hyponatremia.  6. Nonspecific electrocardiographic changes with multiple cardiac risk      factors.  7. Diabetes mellitus.  8. Rule out congestive heart failure.  9. Hypertension.  10.Preoperative cardiac evaluation.   PLAN:  1. The patient's EKG changes are nonspecific.  These occurred in the      setting of tachycardia and profound anemia.  Suggest repeat      electrocardiogram in the morning.  2. No clear indication to draw cardiac enzymes at the present time as      the patient is asymptomatic from a cardiovascular perspective.  3. Echocardiographic report pending.  This will be reviewed tonight.      Unless the echocardiogram shows significant wall motion      abnormalities, there is no contraindication for this patient to      proceed with vascular foot surgery for her osteomyelitis.  Beta      blocker will need to be continued perioperatively.  There is no      clear indication for ischemia screening with a Cardiolite perfusion      study.   Recommendations have been discussed with the patient and her father.  I  will obtain a BNP level given the fact that she was admitted with  shortness of breath and a cough, but again, clinically there appears to  be no heart  failure.      Ernestine Mcmurray, MD,FACC  Electronically Signed     GED/MEDQ  D:  03/31/2006  T:  03/31/2006  Job:  [id number]

## 2010-08-02 NOTE — H&P (Signed)
[name], [name]:  [id number]   MEDICAL RECORD NO.:  [id number]          PATIENT TYPE:  IPS   LOCATION:  4028                         FACILITY:  [facility]   PHYSICIAN:  Meredith Staggers, M.D.DATE OF BIRTH:  1955/05/10   DATE OF ADMISSION:  04/07/2006  DATE OF DISCHARGE:                              HISTORY & PHYSICAL   PRIMARY CARE PHYSICIAN:  Dr. Nils Pyle.   CHIEF COMPLAINT:  Right leg pain.   HISTORY OF PRESENT ILLNESS:  This is a 55 year old white female with a  history of diabetes, hypertension, and osteomyelitis of the right foot,  with prior 3rd toe and 2nd toe amputations.  She was admitted on January  13th, with weakness and microcytic anemia and osteomyelitis of the right  foot.  After cardiac clearance, when nonspecific ST wave changes were  found, patient underwent a right below-the-knee amputation by Dr.  Veverly Fells.  Postoperatively, the patient was treated for fluid overload  with IV Lasix and adjustment of her IV fluids.  Patient had persistent  fevers, blood cultures x2 positive for MRSA.  She has been placed IV  vancomycin for 2 weeks for treatment.  Right leg is generally stable.  Orthopedics has recommended continued dressing without regular changes  for 7 to 10 days.  The patient tells me that the dressing fell off  yesterday and was witnessed by nursing and appeared to be clean and  intact.  Patient is suffering from significant stump and phantom pain in  the right leg.  Was some question of a fall at some point in her stay,  but information they have is mixed on that.   REVIEW OF SYSTEMS:  Positive for palpitations, occasional itching, pain,  wound issues as mentioned above.  Patient may be having some mild  constipation.  She also reports urinary frequency and questions a yeast  infection in the groin.   PAST MEDICAL HISTORY:  Positive for:  1. Diabetes.  2. Hypertension.  3. Depression.  4. Neuropathy.  5. Insomnia.   FAMILY  HISTORY:  Noncontributory.   SOCIAL HISTORY:  Patient is married and lives in [place] in a one-  level house with two steps to enter.  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  She denies alcohol or tobacco use.  She is independent  prior to arrival.   ALLERGIES:  NONE.   HOME MEDICATIONS:  1. NPH insulin 35 units b.i.d.  2. Glucophage 1000 mg b.i.d.  3. Zoloft 100 mg daily versus b.i.d.  4. Accupril 20 mg q.h.s.  5. Elavil 25 mg q.h.s.  6. Neurontin 600 mg daily.  7. Hydrochlorothiazide daily.  8. __________ daily.  9. Lipitor daily.  10.Metoprolol 50 mg b.i.d.   LABS:  Hemoglobin 9.4, white count 8.2, platelets 405,000.  Sodium 138,  potassium 3.7, BUN and creatinine 4 and 0.71.   PHYSICAL EXAMINATION:  VITAL SIGNS:  Blood pressure 119/71, pulse 98,  respiratory rate 20, temperature 98.8.  GENERAL:  Patient is very pleasant in no acute distress.  She is alert  and oriented x3.  EARS/NOSE/THROAT:  Unremarkable with intact dentition.  Oral mucosa pink  and moist.  NECK:  Supple, without JVD and lymphadenopathy.  CHEST:  Clear to auscultation bilaterally without wheezes, rales, or  rhonchi.  HEART:  Regular rhythm and slightly tachycardic.  EXTREMITIES:  No clubbing and no edema.  ABDOMEN:  Round with intact bowel sounds. I would say she is slightly  distended.  There was a small ulcerated region in the periumbilical  region.  SKIN:  Skin was notable for multiple excoriated areas where she has a  small wound over the right patella, just above the surgical dressing.  NEUROLOGIC:  Cranial nerves II-XII are intact.  Sensation was decreased  in the distal limbs.  Cognitively, she was appropriate with normal mood.  Motor function was 5/5 in the upper extremities.  She was 4+/5 to 5/5 in  the left lower extremity.  Right leg was 4/5 proximally at the hips,  3+/5 to 4/5 at the knee.  Did not examine dressing per request of  orthopedic surgery.  She was tender to touch  at the area of her Ace  wrap.  Inguinal region was notable for some mild redness.   ASSESSMENT/PLAN:  1. Functional deficits secondary to osteomyelitis of the right foot,      status post right below-the-knee amputation, postop day #6.  Begin      comprehensive inpatient rehab with physical therapy to assess and      treat for lower extremity strengthening, mobility, essentially at      the wheelchair level, plus or minus with the walker.  Occupational      therapy will assess and treat for upper extremity use in ADLs.  A      24-hour rehab nurse will follow for bowel/bladder, skin, pain, and      safety issues.  Rehab case manager/social worker will assess for      psychosocial needs and discharge disposition.  Estimated length of      stay is 7 days.  Prognosis is fair.  Goals:  Modified independent      at the wheelchair level.  2. Pain management on OxyContin q.a.m. and p.r.n. oxycodone.  Will      increase Neurontin for phantom sensations.  May need another agent      for this.  3. Deep vein thrombosis prophylaxis.  Subcutaneous Lovenox.  4. Diabetes:  Continue a.c. and h.s. sugar checks for hyperglycemia.      Adjust medications as needed.  5. Hypertension:  Monitor.  6. Congestive heart failure:  Continue with Lasix.  7. Anemia:  Iron supplementation.      Meredith Staggers, M.D.  Electronically Signed    ZTS/MEDQ  D:  04/07/2006  T:  04/07/2006  Job:  [id number]

## 2010-08-02 NOTE — Op Note (Signed)
[name], [name]              ACCOUNT NO.:  [id number]   MEDICAL RECORD NO.:  [id number]          PATIENT TYPE:  INP   LOCATION:  3036                         FACILITY:  [facility]   PHYSICIAN:  Doran Heater. Veverly Fells, M.D. DATE OF BIRTH:  01-04-1956   DATE OF PROCEDURE:  04/01/2006  DATE OF DISCHARGE:                               OPERATIVE REPORT   PREOPERATIVE DIAGNOSIS:  Right foot osteomyelitis.   POSTOPERATIVE DIAGNOSIS:  Right foot osteomyelitis.   PROCEDURE PERFORMED:  Right leg below-knee amputation.   ATTENDING SURGEON:  Doran Heater. Veverly Fells, M.D.   ASSISTANT:  Abbott Pao. Dixon, P.A.-C.   ANESTHESIA:  General anesthesia was used.   ESTIMATED BLOOD LOSS:  Was minimal.   TOURNIQUET TIME:  Was one hour and 30 minutes.   INSTRUMENT COUNT:  Correct.   COMPLICATIONS:  None.   PERIOPERATIVE ANTIBIOTICS:  Given.   INDICATIONS:  [name] a 55 year old female with a history of  worsening right foot osteomyelitis.  The patient's recent x-rays  demonstrated complete obliteration of multiple metatarsals, as well as  edema in the calcaneus and the cuboid consistent with potential hindfoot  osteomyelitis.  After discussion with the patient options for  management, the patient and family elected to proceed with below-knee  amputation to rid the patient of infection.  Informed consent was  obtained.   DESCRIPTION OF PROCEDURE:  After an adequate level of anesthesia was  achieved, the patient was positioned supine on the operating room table.  The right leg was correctly identified.  The left leg was padded  appropriately and secured to the table.  After placement of sterile  tourniquet to the right proximal thigh the right leg was sterilely  prepped and draped in the usual manner.  After elevation of the leg for  five minutes we elevated the tourniquet to 350 mmHg.  A standard below-  knee amputation incision was utilized with a posterior hockey-stick type  incision.  This  started about a hands breadth below the tibial tubercle  and was done using a 10 blade scalpel.  The Bovie electrocautery was  used to control hemostasis.  We identified the significant veins and  ligated those with suture ligature.  We divide muscle using Bovie  electrocautery, identified the major nerve vascular bundles and divided  those.  We cut the posterior tibial nerve sharply so that could retract  back up in the leg using a 15 blade.  We had excellent hemostasis  throughout the surgery.  At this point we went ahead and thoroughly  irrigated the wound and then closed the fascia to itself utilizing 0-  Vicryl suture and then subcutaneous closure with 2-0 Vicryl followed by  wide staples.  A nonstick dressing was applied followed by compressive  bandage and then a stump splint with the knee in full extension.  At  this point after we had a compressive bandage on we went ahead and  deflated the tourniquet at one hour and 30 minutes.  The patient  tolerated the surgery well and was taken to the recovery room.      Doran Heater.  Veverly Fells, M.D.  Electronically Signed     SRN/MEDQ  D:  04/01/2006  T:  04/02/2006  Job:  [id number]

## 2010-08-02 NOTE — H&P (Signed)
NAMEMARLETTE, NJOKU NO.:  [id number]   MEDICAL RECORD NO.:  [id number]          Foster TYPE:  EMS   LOCATION:  ED                           FACILITY:  [facility]   PHYSICIAN:  Cherene Altes, M.D.DATE OF BIRTH:  23-Aug-1955   DATE OF ADMISSION:  03/29/2006  DATE OF DISCHARGE:                              HISTORY & PHYSICAL   PRIMARY CARE PHYSICIAN:  Unassigned.   CHIEF COMPLAINT:  Severe weakness.   HISTORY OF PRESENT ILLNESS:  [name] is a 55 year old  female who lives in [place].  [name] states that she has been  getting ongoing care for a wound on her right lateral foot from a  podiatrist.  She also reports she was told by her primary care physician  in [facility] that she was a little bit anemic and given a  prescription for iron pills approximately one week ago.  As Tonya week has  progressed, [name] has become progressively weaker.  She describes  Tonya sensation as simply not having any strength.   As her symptoms were progressing, [name] got to Tonya point that she  felt like she couldn't even get out of bed.  There was no chest pain.  There were no fever or chills, nausea, vomiting, abdominal pain.  There  was no shortness of breath.  [name] has not experienced significant  pain in her foot.  She denies melena, hematochezia, hematemesis.  She  does state that she has not reached menopause and does continue to  menstruate but has not had a menstrual period in over three months now.   REVIEW OF SYSTEMS:  Comprehensive review of systems is unremarkable.   PAST MEDICAL HISTORY:  1. Hypertension.  2. Depression.  3. Diabetes mellitus type 2.  4. Hypercholesterolemia.  5. Extensive history of osteomyelitis.      a.     Status post second toe amputation January 2004.      b.     Status post third toe amputation right foot December 2006.   OUTPATIENT MEDICATIONS:  1. Neurontin 600 mg t.i.d.  2. Elavil 25 mg q.h.s.  3.  Novolin N-exact dose unclear.  4. HCTZ 12.5 mg daily.  5. Prilosec 20 mg daily.  6. Vicodin 1 tablet q.i.d.  7. Zoloft 200 mg daily.  8. Zyrtec D b.i.d.  9. Accupril 20 mg daily.  10.Glucophage 1000 mg b.i.d.  11.Lipitor 80 mg daily.  12.Metoprolol 100 mg daily.   ALLERGIES:  No known drug allergies.   FAMILY HISTORY:  Noncontributory this admission.   SOCIAL HISTORY:  [name] does not smoke.  She does not drink.  She  is married.  She lives in [place] with her husband.   LABORATORY DATA:  White count is elevated at 16,000.  Hemoglobin is  remarkably low at 6.2.  MCV is low at 68.  Platelet count is 548.  Sodium is low at 123.  Potassium, chloride, bicarb, BUN and creatinine  are normal.  Serum glucose is elevated at 136.  Calcium is elevated at  8.9.  AST  is elevated at 142, ALT is elevated at 212, alkaline phosphate  is elevated at 208.  Total bili is normal.  Total protein is 7 with an  albumin of 2.4.  Urinalysis is remarkable for 30+ protein.  INR is 1.4  with a PT of 17.2 and a PTT of 38.   PHYSICAL EXAMINATION:  Temperature 101.2, blood pressure 134/76, heart  rate 117, respiratory rate 24, O2 saturation 99% on room air.  GENERAL:  Obese female in no acute respiratory distress.  HEENT:  Normocephalic and atraumatic.  Pupils are equal, round, and  reactive to light and accommodation.  Extraocular muscles intact.  OC/OP  clear.  NECK:  No JVD.  No lymphadenopathy.  LUNGS:  Clear to auscultation bilaterally without wheeze or rhonchi.  CARDIOVASCULAR:  Tachycardic but regular without murmur, rub, or gallop  with normal S1-S2.  ABDOMEN:  Obese, soft.  Bowel sounds present.  No hepatosplenomegaly, no  rebound and no ascites.  EXTREMITIES:  No significant clubbing, cyanosis, or edema bilateral  lower extremities.  MUSCULOSKELETAL:  [name] has a wound extending her fifth and fourth  toes and around Tonya lateral aspect of her entire right foot.  There is a   fibrinous-type exudate covering Tonya majority of Tonya anterior of Tonya  wound.  There are what appeared to be openings that appear to pass  deeper into Tonya foot.  I am not able to express any pus from Tonya foot  but there is great pain on palpation of Tonya foot.  There is erythema  from Tonya lateral aspect, extending over to Tonya lateral aspect of Tonya  dorsum of Tonya foot.  There are multiple dry wounds on many toes of Tonya  left foot but no evidence of purulence at Tonya present time.  NEUROLOGICAL:  Cranial nerves II through XII intact bilaterally.  There  is 5/5 strength bilateral upper and lower extremities.  Intact sensation  and touch throughout.  Alert and oriented x3.   IMPRESSION/PLAN:  1. Severe microcytic anemia:  Tonya exact cause of [name]'s anemia      is not clear.  There is likely a component of anemia of chronic      disease as [name] appears to have a smoldering osteomyelitis      of Tonya right foot.  Certainly, this Foster will be at risk for      iron-deficiency anemia with a possible occult gastrointestinal      blood loss as well.  We will guaiac [name]'s stools.  We will      check an anemia panel.  We will check a ferritin.  Given a concern      of possible smoldering disseminated intravascular coagulation, I      would also check hemolysis labs.  We will transfuse [name] two      units of packed red blood cells in expectation of probable surgery      for osteomyelitis.  We will consider consultation during this      hospital stay if indicated during further workup.  2. Osteomyelitis right lateral aspect of Tonya foot:  [name] has      what appears on clinical exam to be osteomyelitis of Tonya right      foot.  She has undergone amputation of multiple digits on this foot      in Tonya past.  It is my concern that [name] will need a full      transmetatarsal amputation  if not above Tonya ankle amputation at     this time.  X-rays of this foot are pending  to evaluate for      osteomyelitis.  If plain films are not satisfactory, we may need to      progress to a MRI of Tonya foot.  I will check ABIs to assess blood      flow to Tonya extremities.  I will place [name] on empiric Zosyn      for polymicrobial coverage as is typically required in diabetic      patients.  We will consult Dr. Kipp Brood. Gioffre, [name]'s      orthopedist, if there is in fact evidence of osteomyelitis.  3. Elevated liver function tests and elevated coagulases:  There is no      clear explanation for these findings in this Foster.  I am      concerned about a possible smoldering disseminated intravascular      coagulation.  [name] does not clinically appear to be septic.      I will check a disseminated intravascular coagulation panel.  I      will also check an ultrasound of Tonya liver to check Tonya liver      parenchyma.  Hepatitis panel will be obtained.  We will follow up      coagulases and liver function tests in Tonya morning.  4. Hyponatremia:  This is likely related to [name]'s HCTZ      therapy.  I will hold diuretics and hydrate [name] gently with      isotonic normal saline solution.  5. T-wave inversions in Tonya lateral leads:  Review of [name]'s 12-      lead EKG reveals prominent T-wave inversions in leads I and aVL.      These were not persistent throughout her leads.  [name] has no      complaints of chest pain whatsoever.  I will not place [name]      on aspirin as it is anticipated that she may require surgical      incision and drainage of her right foot.  I will not place Tonya      Foster on full dose Lovenox for Tonya same reason and also because      of complicating factor of a severe anemia.  Beta blocker will be      continued.  Lipitor will be continued.  We will cycle cardiac      enzymes and obtain electrocardiograms x2 mornings for further      evaluation.  6. Uncontrolled diabetes mellitus:  We will place  [name] on      sliding scale insulin.  I will hold Glucophage as contrasted      studies may be required during Tonya hospital stay.  We will      administer standing dose Lantus to help compensate for this.      Sliding scale insulin will otherwise be followed to assure Tonya      Foster's blood sugars are strictly controlled.  7. Hypertension:  We will hold HCTZ as discussed above but we will      continue Metoprolol.  We will also continue Accupril.  Other      medications will be titrated as necessary based upon [name]'s      blood pressure during this hospital stay.      Cherene Altes, M.D.  Electronically Signed  JTM/MEDQ  D:  03/30/2006  T:  03/30/2006  Job:  [id number]

## 2010-08-02 NOTE — Assessment & Plan Note (Signed)
[facility] OFFICE NOTE   AHJAH, RAUDENBUSH                     MRN:          [id number]  DATE:05/01/2006                            DOB:          1955-10-18    [name] is seen today in follow up.  I saw her as a consult in the  hospital.  She is a delightful upbeat woman who had osteomyelitis of the  right leg requiring amputation.  She had Staph septicemia and was  initially admitted with profound anemia with a hematocrit of 19.  In the  hospital, she had some lateral T-wave inversions.  She ruled out for  myocardial infarction.  Her TEE showed no evidence of SBE.  She had  normal LV function without significant valve disease.  Because of her  coronary risk factors, I told her she should probably follow up with [place]  after her rehab for an Adenosine Myoview.   In talking to the patient, she has done well.  She needs to have a  follow up hemoglobin and hematocrit.  She has not noticed any  significant melena or hematochezia.  She has not noticed any chest pain  or palpitations.  Her right stump is healing well and she is going to  the prosthesis doctor once the swelling is alleviated.   The patient's medications are listed in the chart but they include:  1. Gabapentin 600 q.6h.  2. Lasix 20 b.i.d.  3. Glucophage 1 gr b.i.d.  4. Multi-vitamins.  5. Oxycodone.  6. Zyrtec.  7. Lipitor 80 a day.  8. Novolin 60 units b.i.d.  9. Lopressor 100 a day.  10.Amitriptyline 25 a day.  11.Triamterene/hydrochlorothiazide.  12.Zoloft.   On exam, she looks well.  She is in a wheelchair.  Blood pressure is  only 90/70, pulse is 80 and regular.  HEENT:  Normal.  Carotids are normal without bruits.  LUNGS:  Clear.  HEART:  Normal S1 and S2, normal heart sounds.  ABDOMEN:  Benign.  EXTREMITIES:  Lower extremities have intact pulses on the left. She has  a below knee stump which is healing well on the right.   IMPRESSION:  Multiple coronary risk factors, especially diabetes, with  EKG changes, in hospital follow up Adenosine Myoview to rule out  coronary artery disease, continue risk factor modification with Lipitor  80 a day.  Follow up lipid and liver profile in six months.  The patient  will continue to take a baby aspirin.  I will check her hemoglobin and  hematocrit to make sure that they have stabilized.  I am not quite sure  why the patient is on so much diuretics, her stump is not swollen and  she has never had congestive heart failure.  There is a note somewhere  in the chart about this, but her LV function has been normal and there  was really no diastolic heart failure during the hospitalization.  We  will check a BNP and a BMP with her lab work today, but I think we can  stop her triamterene/hydrochlorothiazide and cut her Lasix  back to 20 a  day.  This will, hopefully, get her blood pressure over [id number] systolic.  We will continue her beta blocker for the time being since she is  relatively tachycardic given her anemia.   Further recommendations will be based on her blood work and the results  of her stress test.  I will see her back in about eight weeks.     Wallis Bamberg. Johnsie Cancel, MD, [facility]  Electronically Signed    PCN/MedQ  DD: 05/01/2006  DT: 05/01/2006  Job #: [id number]   cc:   Dr. Nils Pyle

## 2010-08-02 NOTE — Op Note (Signed)
   [name], [name]                        ACCOUNT NO.:  [id number]   MEDICAL RECORD NO.:  [id number]                   PATIENT TYPE:  INP   LOCATION:  0103                                 FACILITY:  [facility]   PHYSICIAN:  Kipp Brood. Gladstone Lighter, M.D.             DATE OF BIRTH:  04/30/1955   DATE OF PROCEDURE:  04/06/2002  DATE OF DISCHARGE:                                 OPERATIVE REPORT   SURGEON:  Kipp Brood. Gladstone Lighter, M.D.   ASSISTANT:  Elby Showers. Paitsel, P.A.   PREOPERATIVE DIAGNOSES:  1. Gangrene of the right second toe.  2. Diabetes mellitus, controlled with insulin.   POSTOPERATIVE DIAGNOSES:  1. Gangrene of the right second toe.  2. Diabetes mellitus, controlled with insulin.   OPERATION:  1. Disarticulation of the right second toe at the MP joint.  2. Excision of the first metatarsal head of right foot.  3. GraftJacket tendon graft to the defect in the right MP joint region.   DESCRIPTION OF PROCEDURE:  Under general anesthesia, a routine orthopedic  prepping and draping of the right foot was carried out.  A fish-mouth  incision was made over the MP joint, and I disarticulated the second toe at  this joint.  The articular cartilage of the second metatarsal was somewhat  soft.  I removed that as well and literally amputated just the distal  portion of the second metatarsal.  Following this, we thoroughly irrigated  out the area.  There was no purulent material in the area.  It was clean  after we were finished.  I felt that the best thing to do to cover this  defect would be to apply a GraftJacket graft over the defect.  This was  sutured in place.  A small incision was made in the central portion of the  graft for drainage purposes.  Sterile dressings were applied, and the  patient left the operating room in satisfactory condition.                                               Ronald A. Gladstone Lighter, M.D.    RAG/MEDQ  D:  04/06/2002  T:  04/06/2002  Job:  [id number]   cc:    Court Joy, M.D.  [address]  [place], [place] [address]  Fax: [phone]

## 2010-08-02 NOTE — Assessment & Plan Note (Signed)
Tonya OFFICE NOTE   Foster, [name]                     MRN:          [id number]  DATE:06/24/2006                            DOB:          1955/07/29    [name] returns today for followup.  Unfortunately, her rehab has  been slowed somewhat.  She continues to have a slight eschar over the  right stump.  She is a diabetic and she has had an abnormal Myoview.  We  plan on cathing her as soon as she is recovered from her prolonged  hospitalization and amputation.  She is not having chest pain.  I had a  long discussion with her today.  Her eschar is improving and I think we  will proceed with her heart cath in 4 weeks.  She had multiple questions  about her medications, we went over these.  Her 10-point review of  systems is remarkable only for the eschar on her right stump.  She is  not having chest pain.  She does have a hard time laying flat on her  back and inquired about this in regards to her heart cath and I told her  we could use a collagen plug.   CURRENT MEDICATIONS:  1. Gabapentin 600 q.6.  2. Furosemide 20 b.i.d.  3. Glucophage 500, two tablets b.i.d.  4. Zyrtec as needed.  5. Lipitor 80 a day.  6. Insulin 16 units.  7. Novolin b.i.d.  8. Lopressor 100 a day.  9. Amitriptyline 25 a day.  10.Triamterene/hydrochlorothiazide to be dc'd.  11.Zoloft 200 a day.  12.Accupril 20 a day.  13.Ranitidine 150 a day.   Blood pressure today was 122/73, weight is 178, pulse 77, regular.  HEENT:  Normal.  CAROTIDS:  Normal without bruit.  LUNGS:  Clear.  There is an S1, S2 with normal heart sounds.  ABDOMEN:  Benign.  She is status post right below knee amputation with a  small eschar medially.  Her distal pulses in the left are intact.  She has no significant edema.   IMPRESSION:  Diabetic with recent prolonged hospitalization for  amputation.   The patient will have a cath in 4 weeks.  I  went over her medications in  detail.  Her volume status appears fine and I think her triamterene and  hydrochlorothiazide can be stopped.  She had good LV function with mild  AS by echo in the hospital.  The patient will continue her Lipitor for  hypercholesterolemia.   She will continue her Accupril, it is doing a good job with her blood  pressure and protecting her kidneys were her diabetes.  Once we define  her coronary anatomy, we may switch her metoprolol over to Coreg for its  decreased insulin resistance.   The patient will need a followup echocardiogram in a year to further  follow the progress of her aortic stenosis.   Further recommendations in regards to her coronary disease will be based  on the results of her heart cath.  She will continue her rehab and  follow up with the  orthopedic doctors in regards to getting a prosthesis  fitted for her stump.  However, this may have to wait another couple of  weeks while her eschar heals.     [name]. Tonya Cancel, MD, [facility]  Electronically Signed    PCN/MedQ  DD: 06/24/2006  DT: 06/24/2006  Job #: [id number]

## 2010-08-02 NOTE — Discharge Summary (Signed)
[name], [name]                        ACCOUNT NO.:  [id number]   MEDICAL RECORD NO.:  [id number]                   PATIENT TYPE:  INP   LOCATION:  [id number]                                 FACILITY:  [facility]   PHYSICIAN:  Kipp Brood. Gladstone Lighter, M.D.             DATE OF BIRTH:  [birth year]   DATE OF ADMISSION:  04/05/2002  DATE OF DISCHARGE:  04/09/2002                                 DISCHARGE SUMMARY   ADMISSION DIAGNOSES:  1. Insulin-dependent diabetes.  2. Anemia.  3. Hypertension.  4. Hypercholesterolemia.  5. Depression.  6. Gangrene of second toe right foot with osteomyelitis.   DISCHARGE DIAGNOSES:  1. Status post amputation of right second toe.  2. Insulin-dependent diabetes.  3. Anemia.  4. Hypertension.  5. Hypercholesterolemia.  6. Depression.   PROCEDURE:  The patient was taken to the operating room on April 06, 2002  to undergo amputation of her right second toe.  Surgeon Kipp Brood. Gladstone Lighter,  M.D.  Assistant Elby Showers. Paitsel, P.A.   CONSULTS:  Court Joy, M.D. from [facility].   BRIEF HISTORY:  This patient is a 55 year old female who presented to our  office on April 05, 2002 in our acute care clinic.  She was sent in from  [facility] for consultation.  The patient has a history of  cellulitis in the right foot, osteomyelitis in the right second toe.  She  has been receiving IV antibiotics through a PICC line on an outpatient  basis, receiving Rocephin 1 g daily and clindamycin q.i.d. for the past two  weeks.  She had not responded to the medication and it was decided that she  should see an orthopedist today.  The patient was examined in our office and  it was felt that she has a gangrenous infection of her right foot second  toe.  X-ray showed osteomyelitis in the proximal and mid phalanx and she was  scheduled for an amputation at [place]:  CBC at the time of admission:  WBC 9.6, RBC 3.68,  hemoglobin 9.8, hematocrit 29.3, platelet count 401,000.  Routine  chemistries:  Sodium 133, slightly low, potassium 4.3, chloride 100, glucose  408 which is elevated.  Urinalysis at the time of admission had a greater  than 1000 glucose.  EKG at the time of admission revealed sinus tachycardia  at 106.  Chest x-ray revealed no active pulmonary disease.  X-ray of her  right foot revealed osteomyelitis proximal and mid phalanx of her right  second toe.   HOSPITAL COURSE:  The patient was admitted to [facility] and taken  to the operating room on April 06, 2002 to undergo amputation of her right  second toe.  The patient tolerated the procedure well and was allowed to  return to the recovery room and then to the orthopedic floor to continue her  postoperative care.  Juan-Carlos Monguilod,  M.D. was consulted to manage her  diabetes.  The patient progressed well.  Her diabetes was under control.  Her wound was healing and she was discharged home on postoperative day  number three.  The patient was discharged home on April 09, 2002.   DISCHARGE MEDICATIONS:  1. Percocet 10/650 one q.6h. as needed for pain.  2. Robaxin 500 mg one q.4h. as needed for muscle spasm.  3. Augmentin 875 mg b.i.d. for seven days.   DIET:  As tolerated.   ACTIVITY:  As tolerated.  Should wear wooden shoe right foot.  [facility] will be following her for her home care.   FOLLOW UP:  The patient should follow up with Jori Moll A. Gioffre, M.D. next  week on April 12, 2002.  She should call the office to schedule an  appointment time.   CONDITION ON DISCHARGE:  Improved.     Elby Showers. Paitsel, P.A.                     Ronald A. Gladstone Lighter, M.D.    Delsa Bern  D:  05/18/2002  T:  05/18/2002  Job:  [id number]

## 2010-08-02 NOTE — Assessment & Plan Note (Signed)
[place] OFFICE NOTE   Tonya, Foster                     MRN:          [id number]  DATE:05/14/2006                            DOB:          1955/06/21    [name] is seen today in followup.   She is a longstanding diabetic, who I saw in the hospital at the time of  her right lower extremity amputation.   Unfortunately, I do not have all of her hospital notes at this time.  As  I recall from her hospitalization, she had osteomyelitis with MRSA.  I  initially met her to do a TEE to rule out vegetations.  At the time, she  had no evidence of SPE with mild aortic stenosis.   When I talked to the patient, I was concerned about her longstanding  diabetes and lack of risk stratifications for coronary disease.  When  she returned to have a adenosine Myoview study done May 11, 2006,  the adenosine Myoview study was abnormal.  There was some evidence for  left ventricular cavity dilatation.  There was apical ischemia, as well  as inferolateral ischemia.  The changes were somewhat subtle, but I  think they were real.  I had the patient come back today to discuss the  timing and need for heart catheterization.  She is still recovering from  her surgery.  She needs to have her stump shrunk.  She needs to get a  prosthesis and increase her physical activity.  Both psychologically and  physically, I think it would be better to postpone her cath for 2 to 3  months, so long as she remains asymptomatic.  She has had a history of  anemia, but she has not had any blood in her stool, and I think she can  restart an aspirin a day.  She is already taking metoprolol 100 a day.   Her activity is limited, due to her recent amputation, and I think she  will be stable and psychologically and physically be in much better  shape for a heart cath in 2 to 3 months.  She had a fairly prolonged  hospitalization with her  osteomyelitis, and I would like her to recover  from this before we opened another can of worms.   The patient has good LV function.  She does not have any significant PND  or orthopnea.  She has some swelling in her legs, but this is from her  amputation.   CURRENT MEDICATIONS:  1. Gabapentin 600 q.6 h.  2. Lasix 20 b.i.d.  3. Glucophage 1 gram b.i.d.  4. Oxycodone for pain.  5. Lipitor 80 a day.  6. Insulin, 16 units of Novolin b.i.d.  7. Lopressor 100 a day.  8. Amitriptyline 25 at night.  9. Triamterine.  10.Hydrochlorothiazide.  11.Zoloft 200 a day.   EXAMINATION:  GENERAL:  She looks well.  VITAL SIGNS:  Blood pressure is 130/70, pulse 60 and regular.  HEENT:  Normal.  Carotids normal without bruits.  LUNGS:  Clear.  There is an S1, S2 with normal heart  sounds.  There is  an S1, S2 with a mild AS murmur.  ABDOMEN:  Benign.  EXTREMITIES:  Lower extremities have intact pulses on the left.  She is  status post right BKA.   IMPRESSION:  Longstanding diabetes with abnormal Myoview suggesting  possibility of multivessel disease.  Continue beta blocker and aspirin  therapy.  Follow-up visit in 6 to 8 weeks.  Plan catheterization some  time in the next 3 months.  Patient will continue her rehabilitation and  to have her prosthesis fit.  She will continue to have tight control of  her sugar.  She is to see Dr. Nils Pyle tomorrow at [facility].  Patient  will continue her aspirin and beta blocker for her presumed coronary  disease.  She will also continue her Lipitor for hypercholesterolemia.  She has very mild aortic stenosis and a systolic murmur.  This does not  need SP prophylaxis.  It may be an issue if she has three-vessel disease  and needs coronary artery bypass graft, but hopefully her valve disease  will stay quiescent for quite some time.  Further recommendations to be  based on her follow-up visit and eventual heart catheterization.     Wallis Bamberg. Johnsie Cancel, MD, [facility]   Electronically Signed    PCN/MedQ  DD: 05/14/2006  DT: 05/14/2006  Job #: [id number]

## 2010-08-02 NOTE — Op Note (Signed)
[name], [name]              ACCOUNT NO.:  [id number]   MEDICAL RECORD NO.:  [id number]          PATIENT TYPE:  AMB   LOCATION:  DAY                          FACILITY:  [facility]   PHYSICIAN:  Ronald A. Gioffre, M.D.DATE OF BIRTH:  06/29/1955   DATE OF PROCEDURE:  02/26/2005  DATE OF DISCHARGE:                                 OPERATIVE REPORT   SURGEON:  Kipp Brood. Gladstone Lighter, M.D.   ASSISTANT:  Nurse.   PREOPERATIVE DIAGNOSES:  Osteomyelitis of the third toe, right foot.   POSTOPERATIVE DIAGNOSES:  Osteomyelitis of the third toe, right foot.   OPERATION:  Disarticulation of the third toe at the MP joint right foot.   PROCEDURE:  Sterile prep and draping of the right foot was carried out. The  patient had 1 gram of IV Ancef. I then created a local X incision, two flaps  were created anteriorly and posteriorly and I went down and disarticulated  the toe at the MP joint. The distal end of the metatarsal was clean, there  was no pertinent material. She had more of a necrotic toe with more of a dry  gangrenous type picture. The flaps appeared to have good circulation so I  thoroughly irrigated out the area and loosely applied the flaps back  together. Sterile dressings were applied. Prior to surgery,  I did inject  her with 10 mL of 0.5% plain Marcaine.   FOLLOW-UP CARE:  The toe was sent for cultures and sensitivities. She will  be maintained on Keflex 500 mg t.i.d. at home and Vicodin 1 every 4 hours  p.r.n. for pain. I will see her in the office Monday or prior to if there is  a problem.           ______________________________  Kipp Brood Gladstone Lighter, M.D.     RAG/MEDQ  D:  02/26/2005  T:  02/27/2005  Job:  [id number]

## 2010-08-02 NOTE — Discharge Summary (Signed)
NAMEZARIHA, KOMOROSKI NO.:  [id number]   MEDICAL RECORD NO.:  [id number]          PATIENT TYPE:  INP   LOCATION:  5036                         FACILITY:  [facility]   PHYSICIAN:  Edythe Lynn, M.D.       DATE OF BIRTH:  [birth year]   DATE OF ADMISSION:  03/30/2006  DATE OF DISCHARGE:  04/07/2006                               DISCHARGE SUMMARY   The patient's primary care physician is in [place].   DISCHARGE DIAGNOSES:  1. Methicillin-resistant Staphylococcus aureus osteomyelitis of the      right foot with methicillin-resistant Staphylococcus aureus      bacteremia.  2. Status post right below-knee amputation for right foot      osteomyelitis by Doran Heater. Norris, M.D.  3. Peripheral vascular disease, mainly in the right lower extremity.  4. Mild mitral valve regurgitation.  5. Uncontrolled diabetes mellitus type 2.  6. Obesity.  7. Hyperlipidemia.  8. Depression.   DISCHARGE MEDICATIONS:  1. Elavil 25 mg at bedtime.  2. Lipitor 80 mg at bedtime.  3. Lovenox 40 mg daily.  4. Ensure one can three times a day.  5. Neurontin 600 mg three times a day.  6. Insulin sliding scale with each meal.  7. NPH 30 units twice a day.  8. Iron 150 mg daily.  9. Lisinopril 20 mg daily.  10.Claritin 10 mg daily.  11.Metformin 1000 mg twice a day.  12.Toprol 100 mg daily.  13.Multivitamin one tablet daily.  14.OxyContin 10 mg daily.  15.Protonix 40 mg daily.  16.Zoloft 200 mg daily.  17.Maxzide one tablet daily.  18.Vancomycin intravenously 1250 intravenously every 12 hours until      April 13, 2006.  19.Percocet two tablets by mouth every four hours as needed for pain.   PROCEDURES DURING THIS ADMISSION:  1. On March 30, 2006, the patient underwent a foot x-ray with      findings of a fracture of the fourth metatarsal compatible with      active osteomyelitis.  2. March 30, 2006, ultrasound of the abdomen with findings of no      acute abdominal findings, status  post cholecystectomy.  3. March 31, 2006, MRI of the right foot with findings of edema and      enhancement of the second, third, fourth and fifth metatarsal      cuboid, all three cuneiforms, compatible with osteomyelitis.  4. Portable chest x-ray on March 30, 2006, with findings of no acute      abdominal process.  5. April 01, 2006, amputation of the right leg below the knee by Dr.      Esmond Plants.  6. April 06, 2006, transesophageal echocardiogram by Collier Salina C.      Nishan, MD, with findings of ejection fraction of 60% and no      vegetations.  7. April 06, 2006, the patient underwent a PICC line placement for      intravenous antibiotics.   CONSULTATIONS DURING THIS ADMISSION:  1. The patient was seen by Doran Heater. Veverly Fells, M.D.  [place]. Quentin Cornwall, M.D., for infectious disease.  3. Wallis Bamberg. Johnsie Cancel, MD, from [facility].  4. The patient was also seen in consultation by the physical medicine      and rehabilitation group.   For admission history and physical, refer to the dictated H&P done by  Dr. Thereasa Solo March 30, 2006.   HOSPITAL COURSE:  Problem 1.  RIGHT FOOT OSTEOMYELITIS:  Ms. Foxx was admitted with a  diagnosis of osteomyelitis of the right foot.  It was apparent from the  initial examination that the patient does have recurrent methicillin-  resistant Staphylococcus aureus osteomyelitis of the right foot.  The  patient was placed on intravenous vancomycin and admitted to the acute  care units of [facility].  An MRI of the foot confirmed  extensive involvement of all the bones in the right foot.  The patient  also had arterial Dopplers, which confirmed the presence of peripheral  vascular disease in the right lower extremity.  For this reason Ms.  Zendejas was seen in emergent consultation by the orthopedics service  and she had a below-knee amputation done on April 01, 2006.  The blood  cultures drawn on admission for Ms. Gorelick  confirmed the presence of  methicillin-resistant Staphylococcus aureus and for this reason, we have  consulted Dr. Arelia Longest. Robinson from infectious diseases.  After  obtaining a transesophageal echocardiogram which was negative for  vegetation and having surveillance culture negative for recurrent  bacteremia, Dr. Quentin Cornwall advised on a total of 2 weeks of intravenous  vancomycin.  By the time of my dictation the patient has already  completed 1 week of intravenous vancomycin.   Problem 2.  WEAKNESS AND DECONDITIONING, STATUS POST AMPUTATION:  Ms.  Fetherolf will be transferred to the rehabilitation unit at [facility] to complete her postoperative course.  The patient will need to  follow up at the time of discharge with Dr. Veverly Fells from orthopedic  surgery and with the [facility]iology group for a Myoview stress test.      Edythe Lynn, M.D.  Electronically Signed     SL/MEDQ  D:  04/07/2006  T:  04/07/2006  Job:  [id number]   cc:   Doran Heater. Veverly Fells, M.D.  Arelia Longest. Michelle Nasuti., M.D.

## 2010-08-02 NOTE — Assessment & Plan Note (Signed)
[name] returns to the clinic today for followup evaluation.   [name] a 55 year old adult female with a history of diabetes  mellitus, hypertension, and osteoarthritis of the right 3rd toe with  complication after prior amputation.  She was admitted March 29, 2006  with weakness and osteomyelitis of the right foot.  She had failed  conservative treatment and underwent a right below the knee amputation  by Dr. Veverly Fells, April 01, 2006.   The patient was on the Rehabilitation Unit between April 07, 2006 and  April 15, 2006.   Since discharge the patient has been receiving home health therapy  through [place].  She is receiving physical therapy 2 times per week.  She still has some wound healing that is ongoing.  Her blood sugar has  been in the 95 to 110 range with occasional high readings.  She is  mostly wheelchair mobile, but occasionally gets up to ambulate with her  own walker.  She lives with her husband and her 41 year old son.  They  live in a 1 level home with a ramp to enter.  She plans to be very  active with this prosthesis in the future.   MEDICATIONS:  1. Glucophage 1000 mg b.i.d.  2. Zoloft 200 mg.  3. Maxzide 37.5/25 1 q. daily.  4. Elavil 25 mg nightly.  5. Lipitor 80 mg nightly.  6. Toprol XL 100 mg.  7. Accupril 20 mg. q. daily.  8. Lasix 20 mg b.i.d.  9. Robaxin 500 mg q. 6 hours p.r.n.  10.Nu-Iron 1 tablet q. daily.  11.Zyrtec or Claritin per day.  12.Neurontin 600 mg q.i.d.  13.NPH insulin 16 units b.i.d.  14.Oxycodone immediate release, 5 mg 1 tablet q. 4 to 6 hours p.r.n.      for pain.   PHYSICAL EXAMINATION:  A well-appearing adult female seated in a manual  wheelchair.  Vitals were not obtained in the office today.  She has 5/5 strength with bilateral upper extremities and 5/5 strength  to the bilateral lower extremities.  Examination of her right leg shows a 1 cm x 2 cm wound on the medial  aspect of her amputation site.  The  suture line itself shows slight  erythema.  Exam of the lower extremity shows that the toenail has been  lost from the left great toe.  That looks clean and she is dressing it  with neosporin and a Band-Aid.  She has no obvious open areas apart from  the absent toenail in the left lower extremity.   IMPRESSION:  1. Status post right below the knee amputation January 2008.  2. Insulin dependent diabetes mellitus.  3. Hypertension.  4. In the office today we did give the patient a slip so that she can      call independent living and try to get some funding through them to      assist with her prosthesis in addition to her Medicare coverage.      We also gave her a script for oxycodone 500 mg to be used 1 tablet      t.i.d. p.r.n.  for the residual pain that she has at the amputation      site.  I asked her to continue the Neurontin for the phantom pain      of the right foot.  We have also asked her to try to do some      prolonged standing and progressive ambulation at home.  She still  has about 3 more weeks before she can be fitted with a prosthesis.      Once she is fitted, we will probably get an energy storing foot      with an outliner and shuttle pin lock along with a Pelite insert.      Once that prosthesis is obtained, then we will see her in follow up      in this office and set her up for outpatient therapies.  She has      been instructed completely on care of the wounds at the present      time, along with the use of the stump shrinker.  We will plan on      seeing her in followup in approximately 6 to 8 weeks           ______________________________  Jarvis Morgan, M.D.     DC/MedQ  D:  05/20/2006 11:24:02  T:  05/20/2006 12:13:12  Job #:  [id number]

## 2010-08-02 NOTE — Discharge Summary (Signed)
[name], [name]:  [id number]   MEDICAL RECORD NO.:  [id number]          PATIENT TYPE:  IPS   LOCATION:  4028                         FACILITY:  [facility]   PHYSICIAN:  Jarvis Morgan, M.D.   DATE OF BIRTH:  Jul 27, 1955   DATE OF ADMISSION:  04/07/2006  DATE OF DISCHARGE:  04/15/2006                               DISCHARGE SUMMARY   DISCHARGE DIAGNOSES:  1. Right below-the-knee amputation secondary to osteomyelitis.  2. Methicillin resistant Staphylococcus aureus bacteremia.  3. Diabetes mellitus, type 2.  4. Hypertension.  5. Congestive heart failure, compensated.  6. Anemia.   HISTORY OF PRESENT ILLNESS:  [name] is a 55 year old with  history of diabetes, hypertension, osteomyelitis of the right third toe  and secondary to amputation in the past, admitted on March 29, 2006,  with weakness, with microcytic anemia and osteomyelitis of the right  foot.  The patient was noted to have nonspecific ST changes.  Cardiology  was consulted for preop clearance.  2D echo done showed an EF of 55-65  with moderate-to-severe TVR.  Cardiology recommends Myoview for full  workup past discharge.  On April 01, 2006, the patient underwent right  BKA by Dr. Veverly Fells.  Postop course was complicated by fluid overload  requiring treatment with IV fluids as well as decreasing IV fluids.  Postop fevers were noted past surgery, and ID was consulted for input.  The patient with blood cultures positive x2 for MRSA on March 29, 2006, and they recommended IV vancomycin for 2 weeks for treatment.  Currently, the patient with decrease in balance, decrease in endurance.  Secondary to impairments in mobility and self care, we have consulted  for further therapies.   PAST MEDICAL HISTORY:  Significant for diabetes, hypertension,  depression, diabetic neuropathy, and insomnia.   FAMILY HISTORY:  Noncontributory.   SOCIAL HISTORY:  [name] married, lives in [place] in a one-  level home with 2 steps at entry.  Does not use any tobacco or alcohol.  Disabled nurse since 2004.   HOSPITAL COURSE:  [name] was admitted to rehab on April 07, 2006, for inpatient therapies to consist of PT/OT daily.  Past  admission, blood sugars were monitored in [facility] spaces __________ .  The  patient was continued on IV vancomycin through April 14, 2006, and  then PICC line discontinued without difficulty.  The patient showed  improvement in mobility and treatment of the infections.  The patient's  blood sugars was noted to be on downward trend.  Currently, the  patient's blood sugars from 30s to 110 range.  The patient's NPH insulin  was decreased to 16 units b.i.d. at the time of admission.  Blood  pressure is variable from 123XX123 to XX123456, systolic 123456 to 80 diastolic.  The  patient's wound has been monitored long.  This is healing without any  signs or symptoms of infection.  Irritation noted along the staple line.  Edema at stump is noted to be decreasing.   LABORATORY DONE PAST ADMISSION:  Includes check of CBC revealing  hemoglobin  of 9.4, hematocrit 29.0, white count 8.6, platelets 391.  Check of electrolytes revealed sodium 137, potassium 4.1, chloride 96,  CO2 of 30, BUN 6, creatinine 0.78, glucose 120.  UC was done past  admission and grew out 75,000 colonies of Klebsiella.  The patient was  started on Cipro for treatment and is to continue for 7 total days of  antibiotic therapy.   The patient's mood has been stable.  She has been quite speeding along  in therapy and progressed along to being at supine to sit modified  independent for wheelchair with transfers, able to ambulate 30-40 feet  x3 with rolling walker with minimal assist.  Does require cueing to  monitor herself for fatigue and to stop for rest breaks.  She is noted  to have some loss of balance with fatigue, requires total assist, 70% to  navigate stairs, requires total assist +2  for recovering from loss of  balance.  Currently, the patient is independent at wheelchair level,  walks only with assist.  The family is to provide 24-hour supervision  assist as needed past discharge.  In terms of ADLs, the patient is at  modified independent for self care at wheelchair level.  Showering has  not been tested.  The patient did have questions regarding showing past  discharge and is recommended to shower only with assist.  Keep wound  covered during shower and to cleanse with antibacterial soap and water  and re-dress past showering if she elected to do this.  The patient will  continue with further followup, home health PT by [facility],  home health RN arrange for wound monitoring.  On April 15, 2006, the  patient is discharged to home.   DISCHARGE MEDICATIONS:  1. Cipro 250 mg b.i.d.  2. Glucophage 1000 mg b.i.d.  3. Zoloft 200 mg a day.  4. Maxzide 37.5/25 per day.  5. Elavil 25 mg q.h.s.  6. Lipitor 80 mg q.h.s.  7. Toprol XL 100 mg a day.  8. Accupril 20 mg a day.  9. OxyContin CR 10 mg every 12 hours x10 days and 1 per day until      gone.  10.Lasix  #30 Rx, Lasix 20 mg b.i.d. a.m. and p.m.  11.Robaxin 500 mg every 6 hours p.r.n. spasms.  12.Nu-Iron 1 per day.  13.Zyrtec or Claritin D 1 per day.  14.Neurontin 600 mg q.i.d.  15.NPH insulin 16 units b.i.d.  16.Oxycodone IR 5 mg 1-2 every 4 to 6 hours p.r.n. pain.  17.#6 TRX multivitamin 1 per day.   DIET:  Diabetic low salt.   ACTIVITY:  As tolerated at wheelchair level, ambulate only with  assistance.  Wound care:  Cleanse with normal saline and antibacterial  soap and water and apply dry dressing and Ace wrap.   SPECIAL INSTRUCTIONS:  Should not use metoprolol or hydrocodone or  Vicodin.  No alcohol, no smoking, no driving.  [place] to  provide PT and RN.   FOLLOWUP:  [name] to follow up with Dr. Theda Sers at prosthetic clinic, May 20, 2006, at 10:20.  Follow up with Dr.  Veverly Fells, supposed to  have check in a week.  Follow up with Dr. Nils Pyle on April 21, 2006,  at 2 p.m.  Follow up with Dr. Johnsie Cancel, May 01, 2006, at 2 p.m.      Thornton Dales, P.A.    ______________________________  Jarvis Morgan, M.D.    PP/MEDQ  D:  04/15/2006  T:  04/16/2006  Job:  [id number]   cc:   [place] Veverly Fells, M.D.  Wallis Bamberg. Johnsie Cancel, MD, [facility]

## 2010-08-02 NOTE — Consult Note (Signed)
NAMEWM, FALKENHAGEN              ACCOUNT NO.:  [id number]   MEDICAL RECORD NO.:  [id number]            PATIENT TYPE:   LOCATION:                                 FACILITY:   PHYSICIAN:  Peter C. Johnsie Cancel, MD, [place] OF BIRTH:   DATE OF CONSULTATION:  DATE OF DISCHARGE:                                 CONSULTATION   TRANSESOPHAGEAL ECHOCARDIOGRAM:   INDICATIONS:  MRSA bacteremia, rule out SBE vegetations.   The patient was sedated with 75 mcg of Fentanyl and 2 mg of Versed.   Using digital technique and Omniplane probe was advanced into the distal  esophagus.   Transgastric imaging revealed normal left cavity size and function.  The  EF was 60%.   Mitral valve was structurally normal.  There was a trivial to mild MR  which was central and not eccentric.   The aortic valve was trileaflet, all three tips were sclerotic but there  was no stenosis or regurgitation.  There was no evidence of abscess or  vegetation.   The right-sided cardiac chambers were normal.  There was mild TR by  transthoracic echo.  The patient had pulmonary hypertension.  It was  less significant TR jet by TEE.  There was no right-sided valve  vegetations or endocarditis.   Imaging of the septum showed it to be intact by color flow and 2-D.   The left atrial appendage showed no significant thrombus.   Imaging of the aorta showed no debris.   FINAL IMPRESSION:  1. No evidence of subacute bacterial endocarditis.  2. Aortic valve sclerosis.  3. Mild mitral regurgitation.  4. Normal right-sided cardiac valves.  5. No left atrial appendage thrombus.  6. No ASD.  7. Normal aorta.   The patient tolerated the procedure well.  She would appear to have MRSA  contained in the blood stream and that has not spread to the heart.  She  tolerated the procedure well.      Wallis Bamberg. Johnsie Cancel, MD, [facility]  Electronically Signed     PCN/MEDQ  D:  04/06/2006  T:  04/06/2006  Job:  [id number]

## 2010-08-06 NOTE — Op Note (Signed)
  [name], Foster              ACCOUNT NO.:  [id number]  MEDICAL RECORD NO.:  [id number]           PATIENT TYPE:  O  LOCATION:  SDSC                         FACILITY:  [facility]  PHYSICIAN:  Charles E. Fields, MD  DATE OF BIRTH:  Nov 21, 1955  DATE OF PROCEDURE:  07/30/2010 DATE OF DISCHARGE:  07/30/2010                              OPERATIVE REPORT   PROCEDURE:  Amputation of left fourth toe.  PREOPERATIVE DIAGNOSIS:  Osteomyelitis, left fourth toe.  POSTOPERATIVE DIAGNOSIS:  Osteomyelitis, left fourth toe.  ANESTHESIA:  Ankle block.  OPERATIVE DETAILS:  After obtaining informed consent, the patient was taken to the operating room.  The patient was placed in a supine position on the operating table.  After adequate sedation and placement of ankle block by the Anesthesia Team, the patient's entire left foot was prepped and draped in usual sterile fashion.  Next, circumferential incision was made at the base of the left fourth toe.  Incision was carried all the way down the level of the bone.  Bone was transected with a bone cutter.  The remainder of the proximal phalanx was removed with rongeurs.  The metatarsal head was also removed with rongeurs. There was brisk pulsatile bleeding from calcified tibial vessels and hemostasis was obtained with cautery.  The wound was thoroughly irrigated with normal saline solution.  Toe was passed off the table and sent to Pathology as a specimen.  Skin edges were then reapproximated using interrupted vertical mattress and simple nylon sutures.  The patient tolerated the procedure well and there were no complications. Instrument, sponge, and needle counts were correct at the end of the case.  The patient was taken to the recovery room in a stable condition.     Jessy Oto. Fields, MD     CEF/MEDQ  D:  07/30/2010  T:  07/31/2010  Job:  [id number]  Electronically Signed by Ruta Hinds MD on 08/06/2010 08:26:55 AM

## 2010-08-22 ENCOUNTER — Ambulatory Visit (INDEPENDENT_AMBULATORY_CARE_PROVIDER_SITE_OTHER): Payer: Medicare Other | Admitting: Vascular Surgery

## 2010-08-22 DIAGNOSIS — I739 Peripheral vascular disease, unspecified: Secondary | ICD-10-CM

## 2010-08-22 DIAGNOSIS — L98499 Non-pressure chronic ulcer of skin of other sites with unspecified severity: Secondary | ICD-10-CM

## 2010-08-23 NOTE — Assessment & Plan Note (Signed)
OFFICE VISIT  BERETTA, WETHERBY DOB:  Jul 27, 1955                                       08/22/2010 [id number]  The patient returns for followup today after her recent toe amputation. This was performed on 07/30/2010.  She still has some occasional pain in the incision but overall is doing better.  PHYSICAL EXAM:  Today blood pressure is 179/78 in the right arm, heart rate is 83 and regular.  She has an easily palpable 2+ dorsalis pedis pulse.  All the sutures were removed from her amputation site today.  She was given a renewal of her Percocet prescription #10 dispensed.  She should not need any further refills of this.  She will follow up for a noninvasive study of her left leg stents in the near future.    Jessy Oto. Bookert Guzzi, MD Electronically Signed  CEF/MEDQ  D:  08/23/2010  T:  08/23/2010  Job:  [id number]

## 2010-09-12 ENCOUNTER — Ambulatory Visit (INDEPENDENT_AMBULATORY_CARE_PROVIDER_SITE_OTHER): Payer: Medicare Other

## 2010-09-12 DIAGNOSIS — L98499 Non-pressure chronic ulcer of skin of other sites with unspecified severity: Secondary | ICD-10-CM

## 2010-09-12 DIAGNOSIS — I739 Peripheral vascular disease, unspecified: Secondary | ICD-10-CM

## 2010-09-12 NOTE — Assessment & Plan Note (Signed)
OFFICE VISIT  ARNIE, COTTERMAN DOB:  1955/03/25                                       09/12/2010 [id number]  DATE OF SURGERY:  Jul 30, 2010.  The patient presents today with complaints of pain in her left fourth toe amputation site with purulent drainage.  Upon inspection, her bandage did have some drainage on it.  She states it has been yellow in nature, and she is having increased pain on the lateral aspect on the top of her foot.  She states that it has been draining since discharge. Sutures were removed at the last visit.  It started hurting last weekend and has been worsening since.  PHYSICAL EXAMINATION:  Vital signs:  Blood pressure is 145/60 with a mean of 79 in the right arm.  Heart rate is 81and regular.  Her O2 saturation is 97% on room air.  Her left fourth toe amputation site is purple with a small induration site in the middle of it.  I could not express anything from her wound.  She is tender upon palpation on the lateral aspect of her foot and the top aspect.  Dr. Oneida Alar did come in and evaluate her wound.  She does have a palpable dorsalis pedal pulse.  It is his recommendation to make a small incision and let it drain.  I washed this with Betadine and made a small incision with the 15 blade and got a very minimal amount of drainage out of her wound.  I have instructed her to wash this 2-3 times per day with soap and water and keep it dry and if it is draining, she may put a bandage on it.  She was also given Keflex 500 mg p.o. t.i.d. for 5 days as well as a prescription for Percocet #10, no refill.  She will return to see [place] in the clinic in 2 weeks to evaluate the wound at that time, but she knows to call if she is having any further problems.  Evorn Gong, PA  Charles E. Fields, MD Electronically Signed  SE/MEDQ  D:  09/12/2010  T:  09/12/2010  Job:  [id number]

## 2010-10-03 ENCOUNTER — Ambulatory Visit: Payer: Medicare Other

## 2010-12-05 ENCOUNTER — Other Ambulatory Visit: Payer: Medicare Other

## 2010-12-12 LAB — BASIC METABOLIC PANEL
CO2: 21
Chloride: 107
GFR calc Af Amer: >60
GFR calc non Af Amer: 53 — ABNORMAL LOW
Sodium: 137

## 2010-12-12 LAB — COMPREHENSIVE METABOLIC PANEL
ALT: 16
AST: 17
CO2: 26
Calcium: 9.3
GFR calc Af Amer: >60
GFR calc non Af Amer: 51 — ABNORMAL LOW
Sodium: 139

## 2010-12-12 LAB — TYPE AND SCREEN

## 2010-12-12 LAB — CBC
Hemoglobin: 8.1 — ABNORMAL LOW
MCHC: 33.5
MCHC: 33.6
RBC: 2.9 — ABNORMAL LOW
RBC: 3.22 — ABNORMAL LOW
WBC: 11.1 — ABNORMAL HIGH
WBC: 8.4

## 2010-12-12 LAB — URINE MICROSCOPIC-ADD ON

## 2010-12-12 LAB — URINALYSIS, ROUTINE W REFLEX MICROSCOPIC
Glucose, UA: NEGATIVE
Ketones, ur: NEGATIVE
pH: 6

## 2010-12-12 LAB — HEMOGLOBIN A1C
Hgb A1c MFr Bld: 7.8 — ABNORMAL HIGH
Mean Plasma Glucose: 200

## 2010-12-13 LAB — PREGNANCY, URINE: Preg Test, Ur: NEGATIVE

## 2010-12-13 LAB — CARDIAC PANEL(CRET KIN+CKTOT+MB+TROPI)
CK, MB: 10.7 — ABNORMAL HIGH
CK, MB: 13.9 — ABNORMAL HIGH
CK, MB: 2.8
CK, MB: 3.7
CK, MB: 5.6 — ABNORMAL HIGH
Relative Index: 2.3
Relative Index: 2.3
Relative Index: 3.6 — ABNORMAL HIGH
Relative Index: 4.9 — ABNORMAL HIGH
Total CK: 283 — ABNORMAL HIGH
Total CK: 294 — ABNORMAL HIGH
Troponin I: 0.19 — ABNORMAL HIGH
Troponin I: 1.3
Troponin I: 1.51

## 2010-12-13 LAB — CBC
HCT: 25.2 — ABNORMAL LOW
HCT: 30.2 — ABNORMAL LOW
Hemoglobin: 8.4 — ABNORMAL LOW
MCHC: 32.9
Platelets: 222
Platelets: 238
Platelets: 292
RDW: 18.8 — ABNORMAL HIGH
RDW: 18.9 — ABNORMAL HIGH
RDW: 19.1 — ABNORMAL HIGH
WBC: 5.7
WBC: 5.8

## 2010-12-13 LAB — BASIC METABOLIC PANEL
BUN: 14
BUN: 7
CO2: 26
CO2: 27
Calcium: 8.9
Chloride: 105
Chloride: 106
Chloride: 109
Creatinine, Ser: 1.02
GFR calc non Af Amer: 43 — ABNORMAL LOW
GFR calc non Af Amer: 56 — ABNORMAL LOW
GFR calc non Af Amer: 57 — ABNORMAL LOW
GFR calc non Af Amer: >60
Glucose, Bld: 137 — ABNORMAL HIGH
Glucose, Bld: 143 — ABNORMAL HIGH
Glucose, Bld: 172 — ABNORMAL HIGH
Potassium: 3.6
Potassium: 3.8
Potassium: 4.2
Sodium: 138
Sodium: 139
Sodium: 140

## 2010-12-13 LAB — TSH: TSH: 3.21

## 2010-12-13 LAB — IRON AND TIBC: Iron: 47

## 2010-12-13 LAB — RETICULOCYTES
RBC.: 3.12 — ABNORMAL LOW
Retic Count, Absolute: 31.2

## 2010-12-13 LAB — ERYTHROPOIETIN: Erythropoietin: 58.5 — ABNORMAL HIGH

## 2010-12-16 LAB — POCT I-STAT 3, ART BLOOD GAS (G3+)
Acid-Base Excess: 2
Acid-base deficit: 3 — ABNORMAL HIGH
Acid-base deficit: 4 — ABNORMAL HIGH
Acid-base deficit: 4 — ABNORMAL HIGH
Acid-base deficit: 4 — ABNORMAL HIGH
Acid-base deficit: 5 — ABNORMAL HIGH
Bicarbonate: 20.1
Bicarbonate: 21.8
Bicarbonate: 22.9
Bicarbonate: 22.9
Bicarbonate: 23.7
Bicarbonate: 26.9 — ABNORMAL HIGH
O2 Saturation: 100
O2 Saturation: 100
O2 Saturation: 94
O2 Saturation: 95
O2 Saturation: 96
O2 Saturation: 99
Patient temperature: 36.8
Patient temperature: 37.8
Patient temperature: 38
TCO2: 21
TCO2: 23
TCO2: 24
TCO2: 24
TCO2: 25
TCO2: 28
pCO2 arterial: 36.1
pCO2 arterial: 40.7
pCO2 arterial: 42.4
pCO2 arterial: 47 — ABNORMAL HIGH
pCO2 arterial: 47.9 — ABNORMAL HIGH
pCO2 arterial: 50.4 — ABNORMAL HIGH
pH, Arterial: 7.265 — ABNORMAL LOW
pH, Arterial: 7.296 — ABNORMAL LOW
pH, Arterial: 7.303 — ABNORMAL LOW
pH, Arterial: 7.34 — ABNORMAL LOW
pH, Arterial: 7.359
pH, Arterial: 7.411 — ABNORMAL HIGH
pO2, Arterial: 133 — ABNORMAL HIGH
pO2, Arterial: 296 — ABNORMAL HIGH
pO2, Arterial: 347 — ABNORMAL HIGH
pO2, Arterial: 79 — ABNORMAL LOW
pO2, Arterial: 81
pO2, Arterial: 86

## 2010-12-16 LAB — CBC
HCT: 27.6 — ABNORMAL LOW
HCT: 29.5 — ABNORMAL LOW
HCT: 30.1 — ABNORMAL LOW
HCT: 30.2 — ABNORMAL LOW
HCT: 31.3 — ABNORMAL LOW
HCT: 32.5 — ABNORMAL LOW
HCT: 34.3 — ABNORMAL LOW
HCT: 36.9
Hemoglobin: 10.1 — ABNORMAL LOW
Hemoglobin: 10.6 — ABNORMAL LOW
Hemoglobin: 10.8 — ABNORMAL LOW
Hemoglobin: 11.5 — ABNORMAL LOW
Hemoglobin: 12.2
Hemoglobin: 9.2 — ABNORMAL LOW
Hemoglobin: 9.8 — ABNORMAL LOW
Hemoglobin: 9.9 — ABNORMAL LOW
MCHC: 32.6
MCHC: 33.1
MCHC: 33.1
MCHC: 33.2
MCHC: 33.4
MCHC: 33.5
MCHC: 33.7
MCHC: 33.7
MCV: 83.2
MCV: 83.2
MCV: 84
MCV: 84.2
MCV: 84.7
MCV: 84.9
MCV: 84.9
MCV: 85.2
Platelets: 171
Platelets: 177
Platelets: 191
Platelets: 216
Platelets: 218
Platelets: 227
Platelets: 242
Platelets: 307
RBC: 3.26 — ABNORMAL LOW
RBC: 3.5 — ABNORMAL LOW
RBC: 3.54 — ABNORMAL LOW
RBC: 3.56 — ABNORMAL LOW
RBC: 3.76 — ABNORMAL LOW
RBC: 3.87
RBC: 4.12
RBC: 4.35
RDW: 16 — ABNORMAL HIGH
RDW: 16.1 — ABNORMAL HIGH
RDW: 16.2 — ABNORMAL HIGH
RDW: 16.3 — ABNORMAL HIGH
RDW: 16.3 — ABNORMAL HIGH
RDW: 16.4 — ABNORMAL HIGH
RDW: 16.5 — ABNORMAL HIGH
RDW: 17.1 — ABNORMAL HIGH
WBC: 10
WBC: 10.4
WBC: 11.2 — ABNORMAL HIGH
WBC: 13.4 — ABNORMAL HIGH
WBC: 14.2 — ABNORMAL HIGH
WBC: 5.9
WBC: 6.5
WBC: 8.3

## 2010-12-16 LAB — GLUCOSE, CAPILLARY
Glucose-Capillary: 111 — ABNORMAL HIGH
Glucose-Capillary: 112 — ABNORMAL HIGH
Glucose-Capillary: 114 — ABNORMAL HIGH
Glucose-Capillary: 118 — ABNORMAL HIGH
Glucose-Capillary: 118 — ABNORMAL HIGH
Glucose-Capillary: 119 — ABNORMAL HIGH
Glucose-Capillary: 121 — ABNORMAL HIGH
Glucose-Capillary: 121 — ABNORMAL HIGH
Glucose-Capillary: 122 — ABNORMAL HIGH
Glucose-Capillary: 125 — ABNORMAL HIGH
Glucose-Capillary: 127 — ABNORMAL HIGH
Glucose-Capillary: 129 — ABNORMAL HIGH
Glucose-Capillary: 130 — ABNORMAL HIGH
Glucose-Capillary: 133 — ABNORMAL HIGH
Glucose-Capillary: 136 — ABNORMAL HIGH
Glucose-Capillary: 137 — ABNORMAL HIGH
Glucose-Capillary: 138 — ABNORMAL HIGH
Glucose-Capillary: 139 — ABNORMAL HIGH
Glucose-Capillary: 141 — ABNORMAL HIGH
Glucose-Capillary: 142 — ABNORMAL HIGH
Glucose-Capillary: 143 — ABNORMAL HIGH
Glucose-Capillary: 144 — ABNORMAL HIGH
Glucose-Capillary: 145 — ABNORMAL HIGH
Glucose-Capillary: 147 — ABNORMAL HIGH
Glucose-Capillary: 152 — ABNORMAL HIGH
Glucose-Capillary: 152 — ABNORMAL HIGH
Glucose-Capillary: 152 — ABNORMAL HIGH
Glucose-Capillary: 154 — ABNORMAL HIGH
Glucose-Capillary: 155 — ABNORMAL HIGH
Glucose-Capillary: 163 — ABNORMAL HIGH
Glucose-Capillary: 163 — ABNORMAL HIGH
Glucose-Capillary: 165 — ABNORMAL HIGH
Glucose-Capillary: 171 — ABNORMAL HIGH
Glucose-Capillary: 178 — ABNORMAL HIGH
Glucose-Capillary: 182 — ABNORMAL HIGH
Glucose-Capillary: 182 — ABNORMAL HIGH
Glucose-Capillary: 183 — ABNORMAL HIGH
Glucose-Capillary: 184 — ABNORMAL HIGH
Glucose-Capillary: 197 — ABNORMAL HIGH
Glucose-Capillary: 227 — ABNORMAL HIGH
Glucose-Capillary: 81
Glucose-Capillary: 86
Glucose-Capillary: 94
Glucose-Capillary: 99

## 2010-12-16 LAB — BASIC METABOLIC PANEL
BUN: 12
BUN: 13
BUN: 13
BUN: 14
BUN: 9
CO2: 21
CO2: 25
CO2: 27
CO2: 28
CO2: 30
Calcium: 7.8 — ABNORMAL LOW
Calcium: 7.9 — ABNORMAL LOW
Calcium: 8.4
Calcium: 8.7
Calcium: 9.3
Chloride: 102
Chloride: 104
Chloride: 105
Chloride: 107
Chloride: 97
Creatinine, Ser: 0.91
Creatinine, Ser: 0.95
Creatinine, Ser: 0.96
Creatinine, Ser: 0.98
Creatinine, Ser: 1.2
GFR calc Af Amer: 58 — ABNORMAL LOW
GFR calc Af Amer: >60
GFR calc Af Amer: >60
GFR calc Af Amer: >60
GFR calc Af Amer: >60
GFR calc non Af Amer: 48 — ABNORMAL LOW
GFR calc non Af Amer: >60
GFR calc non Af Amer: >60
GFR calc non Af Amer: >60
GFR calc non Af Amer: >60
Glucose, Bld: 102 — ABNORMAL HIGH
Glucose, Bld: 145 — ABNORMAL HIGH
Glucose, Bld: 154 — ABNORMAL HIGH
Glucose, Bld: 159 — ABNORMAL HIGH
Glucose, Bld: 163 — ABNORMAL HIGH
Potassium: 3.9
Potassium: 3.9
Potassium: 3.9
Potassium: 4
Potassium: 4
Sodium: 136
Sodium: 137
Sodium: 137
Sodium: 138
Sodium: 140

## 2010-12-16 LAB — POCT I-STAT, CHEM 8
BUN: 11
BUN: 14
Calcium, Ion: 1.15
Calcium, Ion: 1.16
Chloride: 105
Chloride: 109
Creatinine, Ser: 1
Creatinine, Ser: 1.2
Glucose, Bld: 147 — ABNORMAL HIGH
Glucose, Bld: 150 — ABNORMAL HIGH
HCT: 29 — ABNORMAL LOW
HCT: 32 — ABNORMAL LOW
Hemoglobin: 10.9 — ABNORMAL LOW
Hemoglobin: 9.9 — ABNORMAL LOW
Potassium: 4.1
Potassium: 4.2
Sodium: 140
Sodium: 141
TCO2: 20
TCO2: 23

## 2010-12-16 LAB — WOUND CULTURE
Culture: NO GROWTH
Gram Stain: NONE SEEN

## 2010-12-16 LAB — POCT I-STAT 4, (NA,K, GLUC, HGB,HCT)
Glucose, Bld: 104 — ABNORMAL HIGH
Glucose, Bld: 111 — ABNORMAL HIGH
Glucose, Bld: 112 — ABNORMAL HIGH
Glucose, Bld: 113 — ABNORMAL HIGH
Glucose, Bld: 120 — ABNORMAL HIGH
Glucose, Bld: 137 — ABNORMAL HIGH
Glucose, Bld: 143 — ABNORMAL HIGH
Glucose, Bld: 150 — ABNORMAL HIGH
Glucose, Bld: 169 — ABNORMAL HIGH
Glucose, Bld: 99
HCT: 20 — ABNORMAL LOW
HCT: 21 — ABNORMAL LOW
HCT: 22 — ABNORMAL LOW
HCT: 25 — ABNORMAL LOW
HCT: 25 — ABNORMAL LOW
HCT: 26 — ABNORMAL LOW
HCT: 26 — ABNORMAL LOW
HCT: 26 — ABNORMAL LOW
HCT: 28 — ABNORMAL LOW
HCT: 36
Hemoglobin: 12.2
Hemoglobin: 6.8 — CL
Hemoglobin: 7.1 — CL
Hemoglobin: 7.5 — CL
Hemoglobin: 8.5 — ABNORMAL LOW
Hemoglobin: 8.5 — ABNORMAL LOW
Hemoglobin: 8.8 — ABNORMAL LOW
Hemoglobin: 8.8 — ABNORMAL LOW
Hemoglobin: 8.8 — ABNORMAL LOW
Hemoglobin: 9.5 — ABNORMAL LOW
Potassium: 3.8
Potassium: 3.8
Potassium: 4
Potassium: 4
Potassium: 4.3
Potassium: 4.4
Potassium: 4.7
Potassium: 4.8
Potassium: 4.9
Potassium: 5.5 — ABNORMAL HIGH
Sodium: 130 — ABNORMAL LOW
Sodium: 136
Sodium: 136
Sodium: 136
Sodium: 136
Sodium: 138
Sodium: 138
Sodium: 139
Sodium: 139
Sodium: 141

## 2010-12-16 LAB — PLATELET COUNT: Platelets: 158

## 2010-12-16 LAB — PREPARE PLATELET PHERESIS

## 2010-12-16 LAB — CREATININE, SERUM
Creatinine, Ser: 0.98
Creatinine, Ser: 1.1
GFR calc Af Amer: >60
GFR calc Af Amer: >60
GFR calc non Af Amer: 53 — ABNORMAL LOW
GFR calc non Af Amer: >60

## 2010-12-16 LAB — POCT I-STAT 3, VENOUS BLOOD GAS (G3P V)
Acid-base deficit: 5 — ABNORMAL HIGH
Bicarbonate: 22.1
O2 Saturation: 81
TCO2: 24
pCO2, Ven: 52.1 — ABNORMAL HIGH
pH, Ven: 7.237 — ABNORMAL LOW
pO2, Ven: 54 — ABNORMAL HIGH

## 2010-12-16 LAB — MAGNESIUM
Magnesium: 2.3
Magnesium: 2.4
Magnesium: 2.5

## 2010-12-16 LAB — HEMOGLOBIN: Hemoglobin: 9 — ABNORMAL LOW

## 2010-12-16 LAB — HEMATOCRIT: HCT: 26.7 — ABNORMAL LOW

## 2010-12-16 LAB — PROTIME-INR
INR: 1.3
Prothrombin Time: 17.1 — ABNORMAL HIGH

## 2010-12-16 LAB — APTT: aPTT: 30

## 2010-12-18 LAB — TYPE AND SCREEN
ABO/RH(D): A POS
Antibody Screen: NEGATIVE

## 2010-12-18 LAB — URINALYSIS, ROUTINE W REFLEX MICROSCOPIC
Bilirubin Urine: NEGATIVE
Glucose, UA: NEGATIVE
Hgb urine dipstick: NEGATIVE
Ketones, ur: NEGATIVE
Nitrite: NEGATIVE
Protein, ur: NEGATIVE
Specific Gravity, Urine: 1.012
Urobilinogen, UA: 0.2
pH: 5.5

## 2010-12-18 LAB — URINE MICROSCOPIC-ADD ON

## 2010-12-18 LAB — COMPREHENSIVE METABOLIC PANEL
ALT: 15
AST: 20
Albumin: 3.3 — ABNORMAL LOW
Alkaline Phosphatase: 57
BUN: 16
CO2: 19
Calcium: 8.7
Chloride: 108
Creatinine, Ser: 0.9
GFR calc Af Amer: >60
GFR calc non Af Amer: >60
Glucose, Bld: 134 — ABNORMAL HIGH
Potassium: 4.5
Sodium: 135
Total Bilirubin: 0.7
Total Protein: 6.5

## 2010-12-18 LAB — PROTIME-INR
INR: 1
Prothrombin Time: 13.8

## 2010-12-18 LAB — HEMOGLOBIN A1C
Hgb A1c MFr Bld: 7.6 — ABNORMAL HIGH
Mean Plasma Glucose: 171

## 2010-12-18 LAB — CBC
HCT: 27 — ABNORMAL LOW
Hemoglobin: 8.9 — ABNORMAL LOW
MCHC: 32.8
MCV: 83.3
Platelets: 281
RBC: 3.25 — ABNORMAL LOW
RDW: 18.1 — ABNORMAL HIGH
WBC: 6.7

## 2010-12-18 LAB — BLOOD GAS, ARTERIAL
Acid-base deficit: 3.8 — ABNORMAL HIGH
Bicarbonate: 21
Drawn by: 206361
FIO2: 0.21
O2 Saturation: 97.8
Patient temperature: 98.6
TCO2: 22.3
pCO2 arterial: 40.5
pH, Arterial: 7.336 — ABNORMAL LOW
pO2, Arterial: 107 — ABNORMAL HIGH

## 2010-12-18 LAB — APTT: aPTT: 29

## 2010-12-19 ENCOUNTER — Other Ambulatory Visit (INDEPENDENT_AMBULATORY_CARE_PROVIDER_SITE_OTHER): Payer: Medicare Other | Admitting: *Deleted

## 2010-12-19 ENCOUNTER — Ambulatory Visit (INDEPENDENT_AMBULATORY_CARE_PROVIDER_SITE_OTHER): Payer: Medicare Other | Admitting: *Deleted

## 2010-12-19 DIAGNOSIS — Z48812 Encounter for surgical aftercare following surgery on the circulatory system: Secondary | ICD-10-CM

## 2010-12-19 DIAGNOSIS — I70229 Atherosclerosis of native arteries of extremities with rest pain, unspecified extremity: Secondary | ICD-10-CM

## 2010-12-19 NOTE — Procedures (Unsigned)
LOWER EXTREMITY ARTERIAL DUPLEX  INDICATION:  Left lower extremity stent.  HISTORY: Diabetes:  Yes. Cardiac:  No. Hypertension:  Yes. Smoking:  No. Previous Surgery:  Left superficial femoral and popliteal artery stents on 07/02/2010.  SINGLE LEVEL ARTERIAL EXAM                         RIGHT                LEFT Brachial: Anterior tibial: Posterior tibial: Peroneal: Ankle/Brachial Index:  LOWER EXTREMITY ARTERIAL DUPLEX EXAM  DUPLEX:  Biphasic Doppler waveforms noted throughout the left femoral- popliteal arterial system with velocities of 311 cm/s, 215 cm/s, and 207 cm/s noted in the left proximal profunda, left proximal superficial femoral, and left proximal to mid superficial femoral arteries respectively.  IMPRESSION: 1. Patent left superficial femoral and popliteal artery stents with     elevated velocities, as described above. 2. Ankle brachial indices noted on separate report.  ___________________________________________ Jessy Oto. Fields, MD  CH/MEDQ  D:  12/19/2010  T:  12/19/2010  Job:  [id number]

## 2010-12-26 ENCOUNTER — Other Ambulatory Visit: Payer: Self-pay | Admitting: Vascular Surgery

## 2010-12-26 ENCOUNTER — Encounter: Payer: Self-pay | Admitting: Vascular Surgery

## 2010-12-26 DIAGNOSIS — Z48812 Encounter for surgical aftercare following surgery on the circulatory system: Secondary | ICD-10-CM

## 2010-12-26 DIAGNOSIS — I739 Peripheral vascular disease, unspecified: Secondary | ICD-10-CM

## 2010-12-26 DIAGNOSIS — I6529 Occlusion and stenosis of unspecified carotid artery: Secondary | ICD-10-CM

## 2011-02-13 ENCOUNTER — Emergency Department (HOSPITAL_BASED_OUTPATIENT_CLINIC_OR_DEPARTMENT_OTHER)
Admission: EM | Admit: 2011-02-13 | Discharge: 2011-02-13 | Disposition: A | Discharge status: Home / Self Care | Payer: Medicare Other | Attending: Emergency Medicine | Admitting: Emergency Medicine

## 2011-02-13 ENCOUNTER — Emergency Department (INDEPENDENT_AMBULATORY_CARE_PROVIDER_SITE_OTHER): Payer: Medicare Other

## 2011-02-13 DIAGNOSIS — M549 Dorsalgia, unspecified: Secondary | ICD-10-CM

## 2011-02-13 DIAGNOSIS — I10 Essential (primary) hypertension: Secondary | ICD-10-CM | POA: Insufficient documentation

## 2011-02-13 DIAGNOSIS — W19XXXA Unspecified fall, initial encounter: Secondary | ICD-10-CM | POA: Insufficient documentation

## 2011-02-13 DIAGNOSIS — S39012A Strain of muscle, fascia and tendon of lower back, initial encounter: Secondary | ICD-10-CM

## 2011-02-13 DIAGNOSIS — Z79899 Other long term (current) drug therapy: Secondary | ICD-10-CM | POA: Insufficient documentation

## 2011-02-13 DIAGNOSIS — Y92009 Unspecified place in unspecified non-institutional (private) residence as the place of occurrence of the external cause: Secondary | ICD-10-CM | POA: Insufficient documentation

## 2011-02-13 DIAGNOSIS — R0789 Other chest pain: Secondary | ICD-10-CM

## 2011-02-13 DIAGNOSIS — S335XXA Sprain of ligaments of lumbar spine, initial encounter: Secondary | ICD-10-CM | POA: Insufficient documentation

## 2011-02-13 DIAGNOSIS — T148XXA Other injury of unspecified body region, initial encounter: Secondary | ICD-10-CM

## 2011-02-13 DIAGNOSIS — E119 Type 2 diabetes mellitus without complications: Secondary | ICD-10-CM | POA: Insufficient documentation

## 2011-02-13 HISTORY — DX: Polyneuropathy, unspecified: G62.9

## 2011-02-13 HISTORY — DX: Essential (primary) hypertension: I10

## 2011-02-13 MED ORDER — HYDROCODONE-ACETAMINOPHEN 5-500 MG PO TABS
1.0000 | ORAL_TABLET | Freq: Four times a day (QID) | ORAL | Status: AC | PRN
Start: 2011-02-13 — End: 2011-02-23

## 2011-02-13 MED ORDER — HYDROCODONE-ACETAMINOPHEN 5-325 MG PO TABS
2.0000 | ORAL_TABLET | Freq: Once | ORAL | Status: AC
  Administered 2011-02-13: 2 via ORAL
  Filled 2011-02-13: qty 2

## 2011-02-13 NOTE — ED Notes (Signed)
Lost balance bending over and fell into shower door-yesterday-pain to lower back

## 2011-02-13 NOTE — ED Provider Notes (Signed)
History     CSN: [id number] Arrival date & time: 02/13/2011  4:24 PM   First MD Initiated Contact with Patient 02/13/11 1628      Chief Complaint  Patient presents with  . Fall    (Consider location/radiation/quality/duration/timing/severity/associated sxs/prior treatment) Patient is a 55 y.o. female presenting with fall.  Fall Pertinent negatives include no fever, no numbness, no abdominal pain, no hematuria and no headaches.  s/p fall at home yesterday, tripped/slipped in bathroom, fell against bath/shower. No loc. No headache. C/o low back pain and left lateral lower rib pain. Worse w movement, change of position, turning, bending, palpation. No neck pain. No radicular pain or numbness/weakness. No cp or sob. No abd pain. No nv. Constant, dull, pain. No meds for pain pta.   Past Medical History  Diagnosis Date  . Diabetes mellitus   . Hypertension   . Neuropathy     Past Surgical History  Procedure Date  . Coronary artery bypass graft   . Leg surgery   . Foot surgery     No family history on file.  History  Substance Use Topics  . Smoking status: Never Smoker   . Smokeless tobacco: Not on file  . Alcohol Use: No    OB History    Grav Para Term Preterm Abortions TAB SAB Ect Mult Living                  Review of Systems  Constitutional: Negative for fever and chills.  HENT: Negative for neck pain.   Eyes: Negative for redness.  Respiratory: Negative for shortness of breath.   Cardiovascular: Negative for chest pain.  Gastrointestinal: Negative for abdominal pain.  Genitourinary: Negative for hematuria.  Musculoskeletal: Positive for back pain.  Skin: Negative for rash.  Neurological: Negative for weakness, numbness and headaches.  Hematological: Does not bruise/bleed easily.  Psychiatric/Behavioral: Negative for confusion.    Allergies  Review of patient's allergies indicates no known allergies.  Home Medications   Current Outpatient Rx  Name  Route Sig Dispense Refill  . AMITRIPTYLINE HCL 25 MG PO TABS Oral Take 25 mg by mouth at bedtime.      . ASPIRIN 81 MG PO TABS Oral Take 81 mg by mouth daily.      . ATORVASTATIN CALCIUM 80 MG PO TABS Oral Take 80 mg by mouth daily.      Marland Kitchen CLOPIDOGREL BISULFATE 75 MG PO TABS Oral Take 75 mg by mouth daily.      . INSULIN ISOPHANE HUMAN 100 UNIT/ML [place] SUSP Subcutaneous Inject 25 Units into the skin every morning.      . INSULIN ISOPHANE HUMAN 100 UNIT/ML [place] SUSP Subcutaneous Inject 35 Units into the skin at bedtime.      Marland Kitchen METFORMIN HCL 500 MG PO TABS Oral Take 500 mg by mouth 4 (four) times daily.      Marland Kitchen METOPROLOL TARTRATE 25 MG PO TABS Oral Take 25 mg by mouth 2 (two) times daily.      Creed Copper M PLUS PO TABS Oral Take 1 tablet by mouth daily.      Marland Kitchen POLYSACCHARIDE IRON 150 MG PO CAPS Oral Take 150 mg by mouth daily.      . QUINAPRIL HCL 40 MG PO TABS Oral Take 40 mg by mouth daily.      Marland Kitchen RANITIDINE HCL 150 MG PO TABS Oral Take 150 mg by mouth 2 (two) times daily.      . SERTRALINE HCL 100 MG  PO TABS Oral Take 100 mg by mouth 2 (two) times daily.        BP 139/85  Pulse 109  Temp(Src) 98.4 F (36.9 C) (Oral)  Resp 20  Ht 5\' 5"  (1.651 m)  Wt 190 lb (86.183 kg)  BMI 31.62 kg/m2  SpO2 98%  Physical Exam  Nursing note and vitals reviewed. Constitutional: She is oriented to person, place, and time. She appears well-developed and well-nourished. No distress.  HENT:  Head: Atraumatic.  Eyes: Conjunctivae are normal. Pupils are equal, round, and reactive to light. No scleral icterus.  Neck: Neck supple. No tracheal deviation present.       c spine non tender. t spine non tender  Cardiovascular: Normal rate, regular rhythm, normal heart sounds and intact distal pulses.   Pulmonary/Chest: Effort normal and breath sounds normal. No respiratory distress. She exhibits tenderness.       Tenderness left lower ribs, post/lat.   Abdominal: Soft. Normal appearance. She exhibits no distension.  There is no tenderness.  Musculoskeletal: She exhibits no edema.       Good rom bil ext without pain. Tenderness diffusely lumbar region. No step off.   Neurological: She is alert and oriented to person, place, and time.       Motor intact bil. Steady gait  Skin: Skin is warm and dry. No rash noted.  Psychiatric: She has a normal mood and affect.    ED Course  Procedures (including critical care time)  Dg Ribs Unilateral W/chest Left  02/13/2011  *RADIOLOGY REPORT*  Clinical Data: [place].  Left chest pain.  LEFT RIBS AND CHEST - 3+ VIEW  Comparison: Chest x-ray 06/14/2009.  Findings: The heart is upper limits of normal in size and stable. Stable surgical changes from bypass surgery.  Vague density near the left heart border is a stable finding and likely prominent epicardial fat no pleural effusion or pneumothorax.  Dedicated views of the left ribs demonstrate no definite acute left- sided rib fractures.  IMPRESSION: No acute cardiopulmonary findings. No definite left-sided rib fractures.  Original Report Authenticated By: P. Kalman Jewels, M.D.   Dg Lumbar Spine Complete  02/13/2011  *RADIOLOGY REPORT*  Clinical Data: [place].  Back pain.  LUMBAR SPINE - COMPLETE 4+ VIEW  Comparison: Prior study 05/06/2010.  Findings: The lateral film demonstrates normal alignment. Vertebral bodies and disc spaces are maintained.  No acute bony findings.  Normal alignment of the facet joints and no pars defects.  The visualized bony pelvis in intact.  Aortic calcifications are noted.  IMPRESSION: Normal alignment and no acute bony findings.  Original Report Authenticated By: P. Kalman Jewels, M.D.       MDM  No meds pta. Pt has a ride, does not have to drive. vicodin 2 po. Xr.   Hr 88 rr 16. c spine nt. abd soft nt. Lumbar muscular tenderness. Discussed w pt, xray results.     Mirna Mires, MD 02/13/11 [phone]

## 2011-04-01 ENCOUNTER — Emergency Department (INDEPENDENT_AMBULATORY_CARE_PROVIDER_SITE_OTHER): Payer: Medicare Other

## 2011-04-01 ENCOUNTER — Encounter (HOSPITAL_BASED_OUTPATIENT_CLINIC_OR_DEPARTMENT_OTHER): Payer: Self-pay | Admitting: Emergency Medicine

## 2011-04-01 ENCOUNTER — Emergency Department (HOSPITAL_BASED_OUTPATIENT_CLINIC_OR_DEPARTMENT_OTHER)
Admission: EM | Admit: 2011-04-01 | Discharge: 2011-04-01 | Disposition: A | Discharge status: Home / Self Care | Payer: Medicare Other | Attending: Emergency Medicine | Admitting: Emergency Medicine

## 2011-04-01 DIAGNOSIS — E119 Type 2 diabetes mellitus without complications: Secondary | ICD-10-CM | POA: Insufficient documentation

## 2011-04-01 DIAGNOSIS — R6889 Other general symptoms and signs: Secondary | ICD-10-CM

## 2011-04-01 DIAGNOSIS — I10 Essential (primary) hypertension: Secondary | ICD-10-CM | POA: Insufficient documentation

## 2011-04-01 DIAGNOSIS — J029 Acute pharyngitis, unspecified: Secondary | ICD-10-CM | POA: Insufficient documentation

## 2011-04-01 DIAGNOSIS — R197 Diarrhea, unspecified: Secondary | ICD-10-CM

## 2011-04-01 DIAGNOSIS — R05 Cough: Secondary | ICD-10-CM

## 2011-04-01 DIAGNOSIS — Z79899 Other long term (current) drug therapy: Secondary | ICD-10-CM | POA: Insufficient documentation

## 2011-04-01 DIAGNOSIS — R059 Cough, unspecified: Secondary | ICD-10-CM

## 2011-04-01 DIAGNOSIS — R52 Pain, unspecified: Secondary | ICD-10-CM

## 2011-04-01 DIAGNOSIS — IMO0001 Reserved for inherently not codable concepts without codable children: Secondary | ICD-10-CM | POA: Insufficient documentation

## 2011-04-01 DIAGNOSIS — N390 Urinary tract infection, site not specified: Secondary | ICD-10-CM | POA: Insufficient documentation

## 2011-04-01 LAB — URINE MICROSCOPIC-ADD ON

## 2011-04-01 LAB — URINALYSIS, ROUTINE W REFLEX MICROSCOPIC
Bilirubin Urine: NEGATIVE
Hgb urine dipstick: NEGATIVE
Specific Gravity, Urine: 1.008 (ref 1.005–1.030)
pH: 6.5 (ref 5.0–8.0)

## 2011-04-01 MED ORDER — SULFAMETHOXAZOLE-TRIMETHOPRIM 800-160 MG PO TABS: 1.0000 | ORAL_TABLET | Freq: Two times a day (BID) | ORAL | Status: AC

## 2011-04-01 MED ORDER — HYDROCODONE-ACETAMINOPHEN 5-500 MG PO TABS
1.0000 | ORAL_TABLET | Freq: Four times a day (QID) | ORAL | Status: AC | PRN
Start: 2011-04-01 — End: 2011-04-11

## 2011-04-01 NOTE — ED Provider Notes (Signed)
History     CSN: [id number]  Arrival date & time 04/01/11  1417   First MD Initiated Contact with Patient 04/01/11 1449      Chief Complaint  Patient presents with  . Generalized Body Aches  . Sore Throat    (Consider location/radiation/quality/duration/timing/severity/associated sxs/prior treatment) Patient is a 56 y.o. female presenting with pharyngitis. The history is provided by the patient. No language interpreter was used.  Sore Throat This is a new problem. The current episode started yesterday. The problem occurs constantly. The problem has been unchanged. Associated symptoms include congestion, coughing, myalgias and a sore throat. Pertinent negatives include no fever or rash. The symptoms are aggravated by nothing. She has tried nothing for the symptoms.    Past Medical History  Diagnosis Date  . Diabetes mellitus   . Hypertension   . Neuropathy     Past Surgical History  Procedure Date  . Coronary artery bypass graft   . Leg surgery   . Foot surgery     No family history on file.  History  Substance Use Topics  . Smoking status: Never Smoker   . Smokeless tobacco: Not on file  . Alcohol Use: No    OB History    Grav Para Term Preterm Abortions TAB SAB Ect Mult Living                  Review of Systems  Constitutional: Negative for fever.  HENT: Positive for congestion and sore throat.   Respiratory: Positive for cough.   Musculoskeletal: Positive for myalgias.  Skin: Negative for rash.  All other systems reviewed and are negative.    Allergies  Review of patient's allergies indicates no known allergies.  Home Medications   Current Outpatient Rx  Name Route Sig Dispense Refill  . AMITRIPTYLINE HCL 25 MG PO TABS Oral Take 25 mg by mouth at bedtime.      . ASPIRIN 81 MG PO TABS Oral Take 81 mg by mouth daily.      . ATORVASTATIN CALCIUM 80 MG PO TABS Oral Take 80 mg by mouth daily.      Marland Kitchen CLOPIDOGREL BISULFATE 75 MG PO TABS Oral Take 75 mg  by mouth daily.      . INSULIN ISOPHANE HUMAN 100 UNIT/ML [place] SUSP Subcutaneous Inject 25 Units into the skin every morning.      . INSULIN ISOPHANE HUMAN 100 UNIT/ML [place] SUSP Subcutaneous Inject 35 Units into the skin at bedtime.      Marland Kitchen METFORMIN HCL 500 MG PO TABS Oral Take 500 mg by mouth 4 (four) times daily.      Marland Kitchen METOPROLOL TARTRATE 25 MG PO TABS Oral Take 25 mg by mouth 2 (two) times daily.      [place]ed Copper M PLUS PO TABS Oral Take 1 tablet by mouth daily.      Marland Kitchen POLYSACCHARIDE IRON 150 MG PO CAPS Oral Take 150 mg by mouth daily.      . QUINAPRIL HCL 40 MG PO TABS Oral Take 40 mg by mouth daily.      Marland Kitchen RANITIDINE HCL 150 MG PO TABS Oral Take 150 mg by mouth 2 (two) times daily.      . SERTRALINE HCL 100 MG PO TABS Oral Take 100 mg by mouth 2 (two) times daily.        BP 107/58  Pulse 92  Temp(Src) 98 F (36.7 C) (Oral)  Resp 16  SpO2 98%  Physical Exam  Nursing  note and vitals reviewed. Constitutional: She is oriented to person, place, and time. She appears well-nourished.  HENT:  Head: Normocephalic and atraumatic.  Right Ear: External ear normal.  Left Ear: External ear normal.  Nose: Rhinorrhea present.  Mouth/Throat: Oropharynx is clear and moist.  Eyes: Conjunctivae and EOM are normal.  Neck: Neck supple.  Cardiovascular: Normal rate and regular rhythm.   Pulmonary/Chest: Effort normal and breath sounds normal.  Abdominal: Soft. Bowel sounds are normal.  Musculoskeletal: Normal range of motion.  Neurological: She is alert and oriented to person, place, and time.  Skin: Skin is warm and dry.  Psychiatric: She has a normal mood and affect.    ED Course  Procedures (including critical care time)  Labs Reviewed  URINALYSIS, ROUTINE W REFLEX MICROSCOPIC - Abnormal; Notable for the following:    Glucose, UA 100 (*)    Leukocytes, UA SMALL (*)    All other components within normal limits  URINE MICROSCOPIC-ADD ON - Abnormal; Notable for the following:    Squamous  Epithelial / LPF FEW (*)    All other components within normal limits   Dg Chest 2 View  04/01/2011  *RADIOLOGY REPORT*  Clinical Data: Cough, runny nose, diarrhea, body aches, sore throat, myalgias, history CABG, hypertension, diabetes  CHEST - 2 VIEW  Comparison: 02/13/2011  Findings: Upper normal heart size post CABG. Mediastinal contours and pulmonary vascularity normal. Minimal chronic bronchitic changes. No definite pulmonary infiltrate, pleural effusion, or pneumothorax. Patchy area density at the left lung base adjacent to the cardiac apex likely represents a prominent epicardial fat pad, and was also identified on studies of 08/25/2007 and 02/13/2011. No acute osseous findings.  IMPRESSION: Minimal chronic bronchitic changes. Prominent epicardial fat pad at cardiac apex. No definite acute abnormalities.  Original Report Authenticated By: Burnetta Sabin, M.D.     1. UTI (lower urinary tract infection)       MDM  Will treat for possible early uti:pt requesting something for generalized pain        Glendell Docker, NP 04/01/11 1551

## 2011-04-01 NOTE — ED Notes (Signed)
Pt c/o general body aches, sore throat & runny nose, onset last pm

## 2011-04-02 NOTE — ED Provider Notes (Signed)
Medical screening examination/treatment/procedure(s) were performed by non-physician practitioner and as supervising physician I was immediately available for consultation/collaboration.   Lezlie Octave, MD 04/02/11 [phone]

## 2011-06-19 ENCOUNTER — Other Ambulatory Visit: Payer: Medicare Other

## 2011-07-14 ENCOUNTER — Ambulatory Visit (INDEPENDENT_AMBULATORY_CARE_PROVIDER_SITE_OTHER): Payer: Medicare Other | Admitting: Vascular Surgery

## 2011-07-14 DIAGNOSIS — I739 Peripheral vascular disease, unspecified: Secondary | ICD-10-CM

## 2011-07-14 DIAGNOSIS — Z48812 Encounter for surgical aftercare following surgery on the circulatory system: Secondary | ICD-10-CM

## 2011-07-14 DIAGNOSIS — I6529 Occlusion and stenosis of unspecified carotid artery: Secondary | ICD-10-CM

## 2011-07-14 NOTE — Progress Notes (Signed)
LLE arterial duplex performed @ [facility] 07/14/2011

## 2011-07-14 NOTE — Progress Notes (Signed)
Carotid duplex performed @ [facility] 07/14/2011

## 2011-07-15 NOTE — Procedures (Unsigned)
LOWER EXTREMITY ARTERIAL DUPLEX  INDICATION:  Peripheral vascular disease.  HISTORY: Diabetes:  Yes. Cardiac:  CAD, CABG on 009/16/2009. Hypertension:  Yes. Smoking:  No. Previous Surgery:  Left superficial femoral artery/popliteal artery stent with percutaneous transluminal angioplasty on 07/02/2010.  SINGLE LEVEL ARTERIAL EXAM                         RIGHT                LEFT Brachial: Anterior tibial: Posterior tibial: Peroneal: Ankle/Brachial Index:   AKA                  1.09 TOE BRACHIAL INDEX      AKA     0.55  Previous ABI dated 12/19/2010  AKA  1.03  LOWER EXTREMITY ARTERIAL DUPLEX EXAM  DUPLEX:  Elevated velocities present involving the left common femoral, profunda femoral, and proximal superficial femoral arteries suggestive of 50-75% stenosis.  IMPRESSION: 1. Left lower extremity native artery stenosis present as noted above. 2. Patent left superficial femoral artery stent with two areas of     heterogenous plaque and elevated velocities present suggesting     stenosis. 3. Right ankle brachial index not obtained due to history of below-     knee amputation. 4. Left ankle brachial index is greater than 0.95 and considered in     the normal range. 5. Left toe brachial index is 0.55 and considered in the claudicant     range.  ___________________________________________ Jessy Oto. Fields, MD  SH/MEDQ  D:  07/15/2011  T:  07/15/2011  Job:  [id number]

## 2011-07-18 ENCOUNTER — Other Ambulatory Visit: Payer: Self-pay | Admitting: *Deleted

## 2011-07-18 DIAGNOSIS — Z48812 Encounter for surgical aftercare following surgery on the circulatory system: Secondary | ICD-10-CM

## 2011-07-18 DIAGNOSIS — I6529 Occlusion and stenosis of unspecified carotid artery: Secondary | ICD-10-CM

## 2011-07-18 DIAGNOSIS — I739 Peripheral vascular disease, unspecified: Secondary | ICD-10-CM

## 2011-07-20 ENCOUNTER — Emergency Department (HOSPITAL_BASED_OUTPATIENT_CLINIC_OR_DEPARTMENT_OTHER)
Admission: EM | Admit: 2011-07-20 | Discharge: 2011-07-20 | Discharge status: Against Medical Advice | Payer: Medicare Other | Attending: Emergency Medicine | Admitting: Emergency Medicine

## 2011-07-20 ENCOUNTER — Encounter (HOSPITAL_BASED_OUTPATIENT_CLINIC_OR_DEPARTMENT_OTHER): Payer: Self-pay

## 2011-07-20 DIAGNOSIS — M79609 Pain in unspecified limb: Secondary | ICD-10-CM | POA: Insufficient documentation

## 2011-07-20 NOTE — ED Notes (Signed)
Pt states that she lost her balance and fell up against a wall injuring her L hand.  Pt states that she has severe pain in the L hand and "it feels like fire shooting through it."  No obvious deformity or swelling, no compromise to circ/sens.

## 2011-07-21 ENCOUNTER — Encounter: Payer: Self-pay | Admitting: Vascular Surgery

## 2011-07-25 NOTE — Procedures (Unsigned)
CAROTID DUPLEX EXAM  INDICATION:  Carotid stenosis.  HISTORY: Diabetes:  Yes. Cardiac:  CAD, CABG 12/01/2007. Hypertension:  Yes. Smoking:  No. Previous Surgery:  Right carotid endarterectomy 08/26/2007. CV History:  Asymptomatic. Amaurosis Fug[place] No, Paresthesias No, Hemiparesis No                                      RIGHT             LEFT Brachial systolic pressure:         162               167 Brachial Doppler waveforms:         WNL               WNL Vertebral direction of flow:        Antegrade         Antegrade DUPLEX VELOCITIES (cm/sec) CCA peak systolic                   79                81 ECA peak systolic                   105               83 ICA peak systolic                   71                Occluded ICA end diastolic                   20                Occluded PLAQUE MORPHOLOGY:                  Heterogenous      Heterogenous PLAQUE AMOUNT:                      Mild              Occlusive PLAQUE LOCATION:                    Bulb              CCA/ICA  IMPRESSION: 1. Right internal carotid artery is patent with history of     endarterectomy, no hyperplasia identified; however, mild     heterogenous plaque evident without hemodynamically significant     velocities present. 2. Bilateral external carotid arteries are patent. 3. Left internal carotid artery is chronically occluded. 4. Bilateral vertebral arteries are patent and antegrade, essentially     unchanged since previous study done 03/01/2008.  ___________________________________________ Jessy Oto. Fields, MD  SH/MEDQ  D:  07/15/2011  T:  07/15/2011  Job:  [id number]

## 2011-09-15 ENCOUNTER — Encounter: Payer: Self-pay | Admitting: Neurosurgery

## 2011-09-16 ENCOUNTER — Ambulatory Visit (INDEPENDENT_AMBULATORY_CARE_PROVIDER_SITE_OTHER): Payer: Medicare Other | Admitting: Vascular Surgery

## 2011-09-16 ENCOUNTER — Ambulatory Visit (INDEPENDENT_AMBULATORY_CARE_PROVIDER_SITE_OTHER): Payer: Medicare Other | Admitting: Neurosurgery

## 2011-09-16 ENCOUNTER — Encounter: Payer: Self-pay | Admitting: Neurosurgery

## 2011-09-16 VITALS — BP 179/87 | HR 97 | Resp 18 | Ht 65.0 in | Wt 185.3 lb

## 2011-09-16 DIAGNOSIS — I70219 Atherosclerosis of native arteries of extremities with intermittent claudication, unspecified extremity: Secondary | ICD-10-CM

## 2011-09-16 DIAGNOSIS — Z48812 Encounter for surgical aftercare following surgery on the circulatory system: Secondary | ICD-10-CM

## 2011-09-16 DIAGNOSIS — I739 Peripheral vascular disease, unspecified: Secondary | ICD-10-CM

## 2011-09-16 HISTORY — DX: Atherosclerosis of native arteries of extremities with intermittent claudication, unspecified extremity: I70.219

## 2011-09-16 NOTE — Progress Notes (Signed)
ABI performed @ [facility] 09/16/2011

## 2011-09-16 NOTE — Progress Notes (Signed)
[facility] Office Note  CC: Three-month lower extremity arterial duplex to evaluate left lower extremity SFA stent Referring Physician: Fields  History of Present Illness: 56 year old female patient of Dr. Oneida Alar was status post a left SFA to popliteal stent with PTA in September 2011. The patient's doing well, she states she has a little pain in her left lower extremity from time to time but is able to ambulate with a prosthesis on the right. The patient denies true claudication, rest pain and has no open ulcerations or lower extremities. Patient denies any new medical diagnoses or recent surgeries. The patient does state she was taken gabapentin and was having falls due to the medicine and has discontinued that.  Past Medical History  Diagnosis Date  . Diabetes mellitus   . Hypertension   . Neuropathy   . Peripheral vascular disease   . Coronary artery disease     ROS: [x]  Positive   [ ]  Denies    General: [ ]  Weight loss, [ ]  Fever, [ ]  chills Neurologic: [ ]  Dizziness, [ ]  Blackouts, [ ]  Seizure [ ]  Stroke, [ ]  "Mini stroke", [ ]  Slurred speech, [ ]  Temporary blindness; [ x] weakness in arms or legs, [ ]  Hoarseness Cardiac: [ ]  Chest pain/pressure, [ ]  Shortness of breath at rest [ ]  Shortness of breath with exertion, [ ]  Atrial fibrillation or irregular heartbeat Vascular: [ ]  Pain in legs with walking, [x ] Pain in legs at rest, [ ]  Pain in legs at night,  [ ]  Non-healing ulcer, [ ]  Blood clot in vein/DVT,   Pulmonary: [ ]  Home oxygen, [ ]  Productive cough, [ ]  Coughing up blood, [ ]  Asthma,  [ ]  Wheezing Musculoskeletal:  [ ]  Arthritis, [ ]  Low back pain, [ ]  Joint pain Hematologic: [ ]  Easy Bruising, [ ]  Anemia; [ ]  Hepatitis Gastrointestinal: [ ]  Blood in stool, [ ]  Gastroesophageal Reflux/heartburn, [ ]  Trouble swallowing Urinary: [ ]  chronic Kidney disease, [ ]  on HD - [ ]  MWF or [ ]  TTHS, [ ]  Burning with urination, [ ]  Difficulty  urinating Skin: [ ]  Rashes, [ ]  Wounds Psychological: [ ]  Anxiety, [ ]  Depression   Social History History  Substance Use Topics  . Smoking status: Never Smoker   . Smokeless tobacco: Not on file  . Alcohol Use: No    Family History History reviewed. No pertinent family history.  No Known Allergies  Current Outpatient Prescriptions  Medication Sig Dispense Refill  . amitriptyline (ELAVIL) 25 MG tablet Take 25 mg by mouth at bedtime.        Marland Kitchen aspirin 81 MG tablet Take 81 mg by mouth daily.        Marland Kitchen atorvastatin (LIPITOR) 80 MG tablet Take 80 mg by mouth daily.        . clopidogrel (PLAVIX) 75 MG tablet Take 75 mg by mouth daily.        . insulin NPH (HUMULIN N,NOVOLIN N) 100 UNIT/ML injection Inject 25 Units into the skin every morning.        . insulin NPH (HUMULIN N,NOVOLIN N) 100 UNIT/ML injection Inject 35 Units into the skin at bedtime.        . metFORMIN (GLUCOPHAGE) 500 MG tablet Take 500 mg by mouth 4 (four) times daily.        . metoprolol tartrate (LOPRESSOR) 25 MG tablet Take 25 mg by mouth 2 (two) times daily.        Marland Kitchen  Multiple Vitamins-Minerals (MULTIVITAMINS THER. W/MINERALS) TABS Take 1 tablet by mouth daily.        . polysaccharide iron (NIFEREX) 150 MG CAPS capsule Take 150 mg by mouth daily.        . quinapril (ACCUPRIL) 40 MG tablet Take 40 mg by mouth daily.        . ranitidine (ZANTAC) 150 MG tablet Take 150 mg by mouth 2 (two) times daily.        . sertraline (ZOLOFT) 100 MG tablet Take 100 mg by mouth 2 (two) times daily.          Physical Examination  Filed Vitals:   09/16/11 1501  BP: 179/87  Pulse: 97  Resp: 18    Body mass index is 30.84 kg/(m^2).  General:  WDWN in NAD Gait: Normal HEENT: WNL Eyes: Pupils equal Pulmonary: normal non-labored breathing , without Rales, rhonchi,  wheezing Cardiac: RRR, without  Murmurs, rubs or gallops; No carotid bruits Abdomen: soft, NT, no masses Skin: no rashes, ulcers noted Vascular Exam/Pulses:  Palpable left lower extremity pulses, 2+ radial pulses bilaterally  Extremities without ischemic changes, no Gangrene , no cellulitis; no open wounds;  Musculoskeletal: no muscle wasting or atrophy  Neurologic: A&O X 3; Appropriate Affect ; SENSATION: normal; MOTOR FUNCTION:  moving all extremities equally. Speech is fluent/normal  Non-Invasive Vascular Imaging: Lower extremity arterial duplex shows an ABI on the left a 0.96 and triphasic. Lower extremity arterial duplex shows slight elevations in velocity within the stent and above the stent, highest being 244.  ASSESSMENT/PLAN: Patient doing well with left lower extremity stenting and right lower extremity prosthesis. The patient will followup in 6 months with repeat stent duplex as well as ABI on the left. Her questions were encouraged and answered, she is in agreement with this plan.  Beatris Ship ANP  Clinic M.D.: Early

## 2011-09-16 NOTE — Procedures (Unsigned)
LOWER EXTREMITY ARTERIAL DUPLEX  INDICATION:  Peripheral vascular disease, claudication  HISTORY: Diabetes:  Yes Cardiac:  CAD, CABG 12/01/2007 Hypertension:  Yes Smoking:  No Previous Surgery:  Left SFA/popliteal stent with percutaneous transluminal angioplasty 12/14/2009.  SINGLE LEVEL ARTERIAL EXAM                         RIGHT                LEFT Brachial: Anterior tibial: Posterior tibial: Peroneal: Ankle/Brachial Index:   AKA                  0.96 Toe/Brachial Index:     AKA                  1.22  PREVIOUS ANKLE BRACHIAL INDEX: 07/14/2011 Right:  AKA  Left 1.03  PREVIOUS TOE BRACHIAL INDEX 07/14/2011  Right:  AKA  Left: 0.55   LOWER EXTREMITY ARTERIAL DUPLEX EXAM  DUPLEX:  Hyperplasia versus heterogeneous plaque evident at the proximal and distal stent anastomosis with peak systolic velocities over [id number] cm/s suggestive of stenosis.    IMPRESSION: 1. Patent left mid superficial femoral artery to popliteal artery     stent with stenosis at proximal and distal anastomosis. 2. Native artery stenosis present in the 50%-75% range involving the     left distal external iliac and proximal left superficial femoral     arteries. 3. Left ankle brachial index is in the normal range. 4. Improved left toe brachial index since study on 07/14/2011.      ___________________________________________ Jessy Oto. Fields, MD  SH/MEDQ  D:  09/16/2011  T:  09/16/2011  Job:  [id number]

## 2011-09-16 NOTE — Progress Notes (Signed)
LLE arterial duplex performed @ [facility] 09/16/2011

## 2012-03-18 ENCOUNTER — Ambulatory Visit: Payer: Medicare Other | Admitting: Neurosurgery

## 2012-03-24 ENCOUNTER — Encounter: Payer: Self-pay | Admitting: Vascular Surgery

## 2012-03-25 ENCOUNTER — Ambulatory Visit (INDEPENDENT_AMBULATORY_CARE_PROVIDER_SITE_OTHER): Payer: Medicare Other | Admitting: Vascular Surgery

## 2012-03-25 ENCOUNTER — Encounter: Payer: Self-pay | Admitting: Vascular Surgery

## 2012-03-25 VITALS — BP 143/69 | HR 85 | Resp 18 | Ht 65.0 in | Wt 188.0 lb

## 2012-03-25 DIAGNOSIS — I6529 Occlusion and stenosis of unspecified carotid artery: Secondary | ICD-10-CM

## 2012-03-25 DIAGNOSIS — Z48812 Encounter for surgical aftercare following surgery on the circulatory system: Secondary | ICD-10-CM

## 2012-03-25 DIAGNOSIS — I739 Peripheral vascular disease, unspecified: Secondary | ICD-10-CM

## 2012-03-25 HISTORY — DX: Peripheral vascular disease, unspecified: I73.9

## 2012-03-25 NOTE — Progress Notes (Signed)
Patient is a 57 year old female with peripheral arterial disease he returns for followup today. She had right carotid endarterectomy in 2009. She has had a previous right below-knee amputation. She has had a left superficial femoral artery stent placed in 2011. She has had amputation of multiple toes left foot. She denies any numbness or tingling in the left leg today. She denies any claudication. She states overall she has been well. She denies any symptoms of TIA amaurosis or stroke.  Past Medical History  Diagnosis Date  . Diabetes mellitus   . Hypertension   . Neuropathy   . Peripheral vascular disease   . Coronary artery disease     Review of systems: She denies shortness of breath. She denies chest pain.  Past Surgical History  Procedure Date  . Leg surgery   . Foot surgery   . Below knee leg amputation Jan. 2008    Right leg  . Carotid endarterectomy   . Tonsillectomy   . Cholecystectomy   . Coronary artery bypass graft 12/01/2007    History   Social History  . Marital Status: Married    Spouse Name: N/A    Number of Children: N/A  . Years of Education: N/A   Social History Main Topics  . Smoking status: Never Smoker   . Smokeless tobacco: Never Used  . Alcohol Use: No  . Drug Use: No  . Sexually Active: None   Other Topics Concern  . None   Social History Narrative  . None    Physical exam: Filed Vitals:   03/25/12 1534  BP: 143/69  Pulse: 85  Resp: 18  Height: 5\' 5"  (1.651 m)  Weight: 188 lb (85.276 kg)   Neck: No carotid bruits  Chest: Clear to auscultation bilaterally  Cardiac: Regular rate and rhythm  Extremities: 2+ femoral pulses bilaterally, 2+ left dorsalis pedis pulse, multiple amputations toes left foot  Data: Patient had a duplex of her left lower extremity today as well as ABIs. ABI was 0.94 with a digit pressure 146 waveforms in the dorsalis pedis were triphasic. Duplex showed some mild narrowing of her superficial femoral artery  stent with velocities were only 228 cm/s  Assessment: Patent left superficial femoral stent, asymptomatic carotid occlusive disease status post carotid endarterectomy  Plan: Repeat carotid duplex and lower extremity evaluation in one year  Ruta Hinds, MD [facility] Office: [phone] Pager: [phone]

## 2012-03-25 NOTE — Progress Notes (Signed)
Left lower extremity arterial duplex performed @ [facility] 03/25/2012

## 2012-03-26 NOTE — Addendum Note (Signed)
Addended by: Mena Goes on: 03/26/2012 08:21 AM   Modules accepted: Orders

## 2012-07-07 ENCOUNTER — Other Ambulatory Visit: Payer: Medicare Other

## 2012-07-07 ENCOUNTER — Ambulatory Visit: Payer: Medicare Other | Admitting: Neurosurgery

## 2012-07-25 ENCOUNTER — Encounter (HOSPITAL_BASED_OUTPATIENT_CLINIC_OR_DEPARTMENT_OTHER): Payer: Self-pay | Admitting: *Deleted

## 2012-07-25 ENCOUNTER — Emergency Department (HOSPITAL_BASED_OUTPATIENT_CLINIC_OR_DEPARTMENT_OTHER)
Admission: EM | Admit: 2012-07-25 | Discharge: 2012-07-25 | Discharge status: Against Medical Advice | Payer: Medicare Other | Attending: Emergency Medicine | Admitting: Emergency Medicine

## 2012-07-25 DIAGNOSIS — S30860A Insect bite (nonvenomous) of lower back and pelvis, initial encounter: Secondary | ICD-10-CM | POA: Insufficient documentation

## 2012-07-25 DIAGNOSIS — Y939 Activity, unspecified: Secondary | ICD-10-CM | POA: Insufficient documentation

## 2012-07-25 DIAGNOSIS — Y929 Unspecified place or not applicable: Secondary | ICD-10-CM | POA: Insufficient documentation

## 2012-07-25 DIAGNOSIS — W57XXXA Bitten or stung by nonvenomous insect and other nonvenomous arthropods, initial encounter: Secondary | ICD-10-CM | POA: Insufficient documentation

## 2012-07-25 NOTE — ED Notes (Signed)
Per registration clerk, pt left, nursing staff was not notified of this until the patient had walked out the door.

## 2012-07-25 NOTE — ED Notes (Signed)
Pt states she has had several tickbites over the past 3 weeks. One of them on her abd is reddened.

## 2013-03-24 ENCOUNTER — Ambulatory Visit: Payer: Medicare Other | Admitting: Family

## 2013-03-24 ENCOUNTER — Other Ambulatory Visit (HOSPITAL_COMMUNITY): Payer: Medicare Other

## 2013-03-24 ENCOUNTER — Encounter (HOSPITAL_COMMUNITY): Payer: Medicare Other

## 2013-03-24 ENCOUNTER — Ambulatory Visit: Payer: Medicare Other | Admitting: Vascular Surgery

## 2013-05-04 ENCOUNTER — Encounter: Payer: Self-pay | Admitting: Vascular Surgery

## 2013-05-05 ENCOUNTER — Encounter: Payer: Self-pay | Admitting: Vascular Surgery

## 2013-05-05 ENCOUNTER — Ambulatory Visit (HOSPITAL_COMMUNITY)
Admission: RE | Admit: 2013-05-05 | Discharge: 2013-05-05 | Disposition: A | Discharge status: Home / Self Care | Payer: Medicare Other | Source: Ambulatory Visit | Attending: Vascular Surgery | Admitting: Vascular Surgery

## 2013-05-05 ENCOUNTER — Ambulatory Visit (INDEPENDENT_AMBULATORY_CARE_PROVIDER_SITE_OTHER)
Admission: RE | Admit: 2013-05-05 | Discharge: 2013-05-05 | Disposition: A | Discharge status: Home / Self Care | Payer: Medicare Other | Source: Ambulatory Visit | Attending: Vascular Surgery | Admitting: Vascular Surgery

## 2013-05-05 ENCOUNTER — Ambulatory Visit (INDEPENDENT_AMBULATORY_CARE_PROVIDER_SITE_OTHER): Payer: Medicare Other | Admitting: Vascular Surgery

## 2013-05-05 VITALS — BP 83/61 | HR 82 | Ht 65.0 in | Wt 184.4 lb

## 2013-05-05 DIAGNOSIS — E119 Type 2 diabetes mellitus without complications: Secondary | ICD-10-CM | POA: Insufficient documentation

## 2013-05-05 DIAGNOSIS — I10 Essential (primary) hypertension: Secondary | ICD-10-CM | POA: Insufficient documentation

## 2013-05-05 DIAGNOSIS — Z48812 Encounter for surgical aftercare following surgery on the circulatory system: Secondary | ICD-10-CM

## 2013-05-05 DIAGNOSIS — S88119A Complete traumatic amputation at level between knee and ankle, unspecified lower leg, initial encounter: Secondary | ICD-10-CM | POA: Insufficient documentation

## 2013-05-05 DIAGNOSIS — Z09 Encounter for follow-up examination after completed treatment for conditions other than malignant neoplasm: Secondary | ICD-10-CM | POA: Insufficient documentation

## 2013-05-05 DIAGNOSIS — I739 Peripheral vascular disease, unspecified: Secondary | ICD-10-CM

## 2013-05-05 DIAGNOSIS — I6529 Occlusion and stenosis of unspecified carotid artery: Secondary | ICD-10-CM

## 2013-05-05 NOTE — Progress Notes (Signed)
Patient is a 58 year old female with peripheral arterial disease he returns for followup today. She had right carotid endarterectomy in 2009. She has had a previous right below-knee amputation. She has had a left superficial femoral artery stent placed in 2011. She has had amputation of multiple toes left foot. She denies any numbness or tingling in the left leg today. She denies any claudication. She states overall she has been well. She denies any symptoms of TIA amaurosis or stroke. She did fall yesterday and has noticed increased pain and swelling in her left ankle.    Past Medical History   Diagnosis  Date   .  Diabetes mellitus     .  Hypertension     .  Neuropathy     .  Peripheral vascular disease     .  Coronary artery disease       Review of systems: She denies shortness of breath. She denies chest pain.    Past Surgical History   Procedure  Date   .  Leg surgery     .  Foot surgery     .  Below knee leg amputation  Jan. 2008       Right leg   .  Carotid endarterectomy     .  Tonsillectomy     .  Cholecystectomy     .  Coronary artery bypass graft  12/01/2007       History      Social History   .  Marital Status:  Married       Spouse Name:  N/A       Number of Children:  N/A   .  Years of Education:  N/A      Social History Main Topics   .  Smoking status:  Never Smoker    .  Smokeless tobacco:  Never Used   .  Alcohol Use:  No   .  Drug Use:  No   .  Sexually Active:  None      Other Topics  Concern   .  None      Social History Narrative   .  None     Physical exam:  Filed Vitals:   05/05/13 1532 05/05/13 1535  BP: 119/42 83/61  Pulse: 82   Height: 5\' 5"  (1.651 m)   Weight: 184 lb 6.4 oz (83.643 kg)   SpO2: 98%    Neck: Bilateral carotid bruits  Chest: Clear to auscultation bilaterally  Cardiac: Regular rate and rhythm 3/6 systolic murmur  Extremities: 2+ femoral pulses bilaterally, absent left dorsalis pedis pulse, multiple amputations toes  left foot, edema of her left lateral malleolus with ecchymosis and tenderness to palpation over the dorsum of foot and left fibular region  Data: Patient had a duplex of her left lower extremity today as well as ABIs. ABI was 0.91 with a digit pressure 96 waveforms in the dorsalis pedis were triphasic. Duplex showed some mild narrowing of her superficial femoral artery stent with velocities were 291 cm/s.  Carotid duplex shows chronic left internal carotid artery occlusion with widely patent right carotid endarterectomy  Assessment: Patent left superficial femoral stent, asymptomatic carotid occlusive disease status post carotid endarterectomy  Plan: Repeat carotid duplex and lower extremity evaluation in one year.  X-ray left foot and ankle to rule out fracture today  Ruta Hinds, MD [facility] Office: [phone] Pager: [phone]

## 2013-05-06 NOTE — Addendum Note (Signed)
Addended by: Dorthula Rue L on: 05/06/2013 09:59 AM   Modules accepted: Orders

## 2013-05-11 ENCOUNTER — Inpatient Hospital Stay (HOSPITAL_COMMUNITY)
Admission: AD | Admit: 2013-05-11 | Discharge: 2013-05-14 | DRG: 493 | Disposition: A | Discharge status: Home / Self Care | Payer: Medicare Other | Source: Ambulatory Visit | Attending: Orthopedic Surgery | Admitting: Orthopedic Surgery

## 2013-05-11 ENCOUNTER — Other Ambulatory Visit (HOSPITAL_COMMUNITY): Payer: Self-pay | Admitting: Orthopedic Surgery

## 2013-05-11 ENCOUNTER — Encounter (HOSPITAL_COMMUNITY): Payer: Self-pay | Admitting: Pharmacy Technician

## 2013-05-11 ENCOUNTER — Inpatient Hospital Stay (HOSPITAL_COMMUNITY): Payer: Medicare Other

## 2013-05-11 ENCOUNTER — Encounter (HOSPITAL_COMMUNITY): Payer: Self-pay | Admitting: *Deleted

## 2013-05-11 DIAGNOSIS — M869 Osteomyelitis, unspecified: Secondary | ICD-10-CM | POA: Diagnosis present

## 2013-05-11 DIAGNOSIS — Z951 Presence of aortocoronary bypass graft: Secondary | ICD-10-CM

## 2013-05-11 DIAGNOSIS — Z794 Long term (current) use of insulin: Secondary | ICD-10-CM

## 2013-05-11 DIAGNOSIS — Z8249 Family history of ischemic heart disease and other diseases of the circulatory system: Secondary | ICD-10-CM

## 2013-05-11 DIAGNOSIS — E1149 Type 2 diabetes mellitus with other diabetic neurological complication: Secondary | ICD-10-CM | POA: Diagnosis present

## 2013-05-11 DIAGNOSIS — F3289 Other specified depressive episodes: Secondary | ICD-10-CM | POA: Diagnosis present

## 2013-05-11 DIAGNOSIS — I10 Essential (primary) hypertension: Secondary | ICD-10-CM | POA: Diagnosis present

## 2013-05-11 DIAGNOSIS — Z7982 Long term (current) use of aspirin: Secondary | ICD-10-CM

## 2013-05-11 DIAGNOSIS — I2510 Atherosclerotic heart disease of native coronary artery without angina pectoris: Secondary | ICD-10-CM | POA: Diagnosis present

## 2013-05-11 DIAGNOSIS — G589 Mononeuropathy, unspecified: Secondary | ICD-10-CM | POA: Diagnosis present

## 2013-05-11 DIAGNOSIS — F411 Generalized anxiety disorder: Secondary | ICD-10-CM | POA: Diagnosis present

## 2013-05-11 DIAGNOSIS — I739 Peripheral vascular disease, unspecified: Secondary | ICD-10-CM | POA: Diagnosis present

## 2013-05-11 DIAGNOSIS — Z833 Family history of diabetes mellitus: Secondary | ICD-10-CM

## 2013-05-11 DIAGNOSIS — D649 Anemia, unspecified: Secondary | ICD-10-CM | POA: Diagnosis present

## 2013-05-11 DIAGNOSIS — E11610 Type 2 diabetes mellitus with diabetic neuropathic arthropathy: Secondary | ICD-10-CM | POA: Diagnosis present

## 2013-05-11 DIAGNOSIS — M84469A Pathological fracture, unspecified tibia and fibula, initial encounter for fracture: Principal | ICD-10-CM | POA: Diagnosis present

## 2013-05-11 DIAGNOSIS — F329 Major depressive disorder, single episode, unspecified: Secondary | ICD-10-CM | POA: Diagnosis present

## 2013-05-11 HISTORY — DX: Unspecified osteoarthritis, unspecified site: M19.90

## 2013-05-11 HISTORY — PX: ANKLE FUSION: SHX881

## 2013-05-11 HISTORY — DX: Type 2 diabetes mellitus without complications: E11.9

## 2013-05-11 HISTORY — DX: Pure hypercholesterolemia, unspecified: E78.00

## 2013-05-11 HISTORY — DX: Personal history of other medical treatment: Z92.89

## 2013-05-11 LAB — GLUCOSE, CAPILLARY: GLUCOSE-CAPILLARY: 191 mg/dL — AB (ref 70–99)

## 2013-05-11 LAB — BASIC METABOLIC PANEL
BUN: 17 mg/dL (ref 6–23)
CHLORIDE: 97 meq/L (ref 96–112)
CO2: 26 mEq/L (ref 19–32)
Calcium: 9.5 mg/dL (ref 8.4–10.5)
Creatinine, Ser: 1.13 mg/dL — ABNORMAL HIGH (ref 0.50–1.10)
GFR calc Af Amer: 62 mL/min — ABNORMAL LOW (ref >90)
GFR calc non Af Amer: 54 mL/min — ABNORMAL LOW (ref >90)
Glucose, Bld: 184 mg/dL — ABNORMAL HIGH (ref 70–99)
Potassium: 4.5 mEq/L (ref 3.7–5.3)
Sodium: 137 mEq/L (ref 137–147)

## 2013-05-11 LAB — CBC
HEMATOCRIT: 24.3 % — AB (ref 36.0–46.0)
HEMOGLOBIN: 8.1 g/dL — AB (ref 12.0–15.0)
MCH: 27.8 pg (ref 26.0–34.0)
MCHC: 33.3 g/dL (ref 30.0–36.0)
MCV: 83.5 fL (ref 78.0–100.0)
Platelets: 287 10*3/uL (ref 150–400)
RBC: 2.91 MIL/uL — ABNORMAL LOW (ref 3.87–5.11)
RDW: 15.5 % (ref 11.5–15.5)
WBC: 6.6 10*3/uL (ref 4.0–10.5)

## 2013-05-11 MED ORDER — AMITRIPTYLINE HCL 25 MG PO TABS
25.0000 mg | ORAL_TABLET | Freq: Every day | ORAL | Status: DC
  Administered 2013-05-11: 25 mg via ORAL
  Filled 2013-05-11 (×2): qty 1

## 2013-05-11 MED ORDER — INSULIN ASPART 100 UNIT/ML ~~LOC~~ SOLN: 4.0000 [IU] | Freq: Three times a day (TID) | SUBCUTANEOUS | Status: DC

## 2013-05-11 MED ORDER — SODIUM CHLORIDE 0.9 % IV SOLN: INTRAVENOUS | Status: DC

## 2013-05-11 MED ORDER — SERTRALINE HCL 100 MG PO TABS
100.0000 mg | ORAL_TABLET | Freq: Two times a day (BID) | ORAL | Status: DC
  Filled 2013-05-11: qty 1

## 2013-05-11 MED ORDER — SERTRALINE HCL 100 MG PO TABS
100.0000 mg | ORAL_TABLET | Freq: Two times a day (BID) | ORAL | Status: DC
  Administered 2013-05-11: 100 mg via ORAL
  Filled 2013-05-11 (×3): qty 1

## 2013-05-11 MED ORDER — PIPERACILLIN-TAZOBACTAM 3.375 G IVPB
3.3750 g | Freq: Four times a day (QID) | INTRAVENOUS | Status: DC
  Filled 2013-05-11 (×3): qty 50

## 2013-05-11 MED ORDER — INSULIN ASPART 100 UNIT/ML ~~LOC~~ SOLN: 0.0000 [IU] | Freq: Three times a day (TID) | SUBCUTANEOUS | Status: DC

## 2013-05-11 MED ORDER — METOPROLOL TARTRATE 25 MG PO TABS
25.0000 mg | ORAL_TABLET | Freq: Two times a day (BID) | ORAL | Status: DC
  Administered 2013-05-11: 25 mg via ORAL
  Filled 2013-05-11 (×3): qty 1

## 2013-05-11 MED ORDER — INSULIN ASPART 100 UNIT/ML ~~LOC~~ SOLN
0.0000 [IU] | Freq: Three times a day (TID) | SUBCUTANEOUS | Status: DC
Start: 2013-05-12 — End: 2013-05-12

## 2013-05-11 MED ORDER — VANCOMYCIN HCL IN DEXTROSE 1-5 GM/200ML-% IV SOLN
1000.0000 mg | Freq: Two times a day (BID) | INTRAVENOUS | Status: DC
  Administered 2013-05-11: 1000 mg via INTRAVENOUS
  Filled 2013-05-11 (×2): qty 200

## 2013-05-11 MED ORDER — MUPIROCIN 2 % EX OINT
TOPICAL_OINTMENT | Freq: Two times a day (BID) | CUTANEOUS | Status: DC
  Filled 2013-05-11 (×2): qty 22

## 2013-05-11 MED ORDER — VANCOMYCIN HCL IN DEXTROSE 1-5 GM/200ML-% IV SOLN
1000.0000 mg | INTRAVENOUS | Status: DC
  Filled 2013-05-11: qty 200

## 2013-05-11 MED ORDER — METFORMIN HCL 500 MG PO TABS
500.0000 mg | ORAL_TABLET | Freq: Three times a day (TID) | ORAL | Status: DC
  Filled 2013-05-11 (×2): qty 1

## 2013-05-11 MED ORDER — METOPROLOL TARTRATE 12.5 MG HALF TABLET: 25.0000 mg | ORAL_TABLET | Freq: Two times a day (BID) | ORAL | Status: DC

## 2013-05-11 MED ORDER — CLOPIDOGREL BISULFATE 75 MG PO TABS: 75.0000 mg | ORAL_TABLET | Freq: Every day | ORAL | Status: DC

## 2013-05-11 MED ORDER — PIPERACILLIN-TAZOBACTAM 3.375 G IVPB
3.3750 g | Freq: Four times a day (QID) | INTRAVENOUS | Status: DC
Start: 2013-05-11 — End: 2013-05-11

## 2013-05-11 MED ORDER — PIPERACILLIN-TAZOBACTAM 3.375 G IVPB
3.3750 g | Freq: Three times a day (TID) | INTRAVENOUS | Status: DC
  Administered 2013-05-11 – 2013-05-12 (×2): 3.375 g via INTRAVENOUS
  Filled 2013-05-11 (×3): qty 50

## 2013-05-11 MED ORDER — FAMOTIDINE 20 MG PO TABS: 10.0000 mg | ORAL_TABLET | Freq: Every day | ORAL | Status: DC

## 2013-05-11 MED ORDER — LISINOPRIL 40 MG PO TABS
40.0000 mg | ORAL_TABLET | Freq: Every day | ORAL | Status: DC
  Filled 2013-05-11: qty 1

## 2013-05-11 MED ORDER — ACETAMINOPHEN 325 MG PO TABS: 650.0000 mg | ORAL_TABLET | Freq: Four times a day (QID) | ORAL | Status: DC | PRN

## 2013-05-11 MED ORDER — QUINAPRIL HCL 10 MG PO TABS: 40.0000 mg | ORAL_TABLET | Freq: Every day | ORAL | Status: DC

## 2013-05-11 MED ORDER — METFORMIN HCL 500 MG PO TABS
500.0000 mg | ORAL_TABLET | Freq: Four times a day (QID) | ORAL | Status: DC
  Administered 2013-05-12: 500 mg via ORAL
  Filled 2013-05-11 (×6): qty 1

## 2013-05-11 MED ORDER — VANCOMYCIN HCL IN DEXTROSE 1-5 GM/200ML-% IV SOLN: 1000.0000 mg | Freq: Two times a day (BID) | INTRAVENOUS | Status: DC

## 2013-05-11 MED ORDER — CHLORHEXIDINE GLUCONATE 4 % EX LIQD
60.0000 mL | Freq: Once | CUTANEOUS | Status: DC
  Filled 2013-05-11: qty 60

## 2013-05-11 MED ORDER — AMITRIPTYLINE HCL 25 MG PO TABS
25.0000 mg | ORAL_TABLET | Freq: Every day | ORAL | Status: DC
  Filled 2013-05-11: qty 1

## 2013-05-11 MED ORDER — OXYCODONE-ACETAMINOPHEN 5-325 MG PO TABS
1.0000 | ORAL_TABLET | ORAL | Status: DC | PRN
  Administered 2013-05-11 – 2013-05-12 (×2): 1 via ORAL
  Filled 2013-05-11 (×2): qty 1

## 2013-05-11 MED ORDER — CLOPIDOGREL BISULFATE 75 MG PO TABS
75.0000 mg | ORAL_TABLET | Freq: Every day | ORAL | Status: DC
  Filled 2013-05-11 (×2): qty 1

## 2013-05-11 NOTE — Progress Notes (Addendum)
ANTIBIOTIC CONSULT NOTE - INITIAL  Pharmacy Consult for Vancomycin and Zosyn Indication: L foot wound infection  Allergies  Allergen Reactions  . Tape Rash    Redness, rash, itchiness    Patient Measurements: Height: 5\' 5"  (165.1 cm) Weight: 184 lb 6.4 oz (83.643 kg) IBW/kg (Calculated) : 57  Vital Signs: Temp: 98.1 F (36.7 C) (02/25 1829) BP: 144/58 mmHg (02/25 1829) Pulse Rate: 98 (02/25 1829) Intake/Output from previous day:   Intake/Output from this shift:    Labs:  Recent Labs  05/11/13 1930  WBC 6.6  HGB 8.1*  PLT 287  CREATININE 1.13*   Estimated Creatinine Clearance: 60 ml/min (by C-G formula based on Cr of 1.13). No results found for this basename: VANCOTROUGH, VANCOPEAK, VANCORANDOM, GENTTROUGH, GENTPEAK, GENTRANDOM, TOBRATROUGH, TOBRAPEAK, TOBRARND, AMIKACINPEAK, AMIKACINTROU, AMIKACIN,  in the last 72 hours   Microbiology: No results found for this or any previous visit (from the past 720 hour(s)).  Medical History: Past Medical History  Diagnosis Date  . Diabetes mellitus   . Hypertension   . Neuropathy   . Peripheral vascular disease   . Coronary artery disease   . Anxiety   . Depression   . Anemia     Medications:  Prescriptions prior to admission  Medication Sig Dispense Refill  . amitriptyline (ELAVIL) 25 MG tablet Take 25 mg by mouth at bedtime.        Marland Kitchen aspirin 81 MG tablet Take 81 mg by mouth daily.        Marland Kitchen atorvastatin (LIPITOR) 80 MG tablet Take 80 mg by mouth daily.        . clopidogrel (PLAVIX) 75 MG tablet Take 75 mg by mouth daily.       . insulin NPH (HUMULIN N,NOVOLIN N) 100 UNIT/ML injection Inject 25 Units into the skin every morning.        . insulin NPH (HUMULIN N,NOVOLIN N) 100 UNIT/ML injection Inject 35 Units into the skin at bedtime.        . metFORMIN (GLUCOPHAGE) 500 MG tablet Take 500 mg by mouth 4 (four) times daily.        . metoprolol tartrate (LOPRESSOR) 25 MG tablet Take 25 mg by mouth 2 (two) times daily.        . Multiple Vitamins-Minerals (MULTIVITAMINS THER. W/MINERALS) TABS Take 1 tablet by mouth daily.        . polysaccharide iron (NIFEREX) 150 MG CAPS capsule Take 150 mg by mouth daily.        . quinapril (ACCUPRIL) 40 MG tablet Take 40 mg by mouth daily.        . ranitidine (ZANTAC) 150 MG tablet Take 150 mg by mouth 2 (two) times daily.       . sertraline (ZOLOFT) 100 MG tablet Take 100 mg by mouth 2 (two) times daily.        Assessment: 58 y.o. Foster presents with L foot wound infection. To begin empiric vancomycin and Zosyn. Estimated CrCl 60 ml/min.  Goal of Therapy:  Vancomycin trough level 10-15 mcg/ml  Plan:  1) Vancomycin 1gm IV q12h. 2) Zosyn 3.375gm IV q8h. Each dose over 4 hours 3) Will f/u renal function, micro data, pt's clinical condition, trough prn  Sherlon Handing, PharmD, BCPS Clinical pharmacist, pager [phone] 05/11/2013,8:18 PM

## 2013-05-12 ENCOUNTER — Inpatient Hospital Stay (HOSPITAL_COMMUNITY): Admission: RE | Admit: 2013-05-12 | Payer: Medicare Other | Source: Ambulatory Visit | Admitting: Orthopedic Surgery

## 2013-05-12 ENCOUNTER — Encounter (HOSPITAL_COMMUNITY): Payer: Self-pay | Admitting: Certified Registered Nurse Anesthetist

## 2013-05-12 ENCOUNTER — Encounter (HOSPITAL_COMMUNITY)
Admission: AD | Disposition: A | Discharge status: Home / Self Care | Payer: Self-pay | Source: Ambulatory Visit | Attending: Orthopedic Surgery

## 2013-05-12 ENCOUNTER — Inpatient Hospital Stay (HOSPITAL_COMMUNITY): Payer: Medicare Other | Admitting: Certified Registered Nurse Anesthetist

## 2013-05-12 ENCOUNTER — Encounter (HOSPITAL_COMMUNITY): Payer: Medicare Other | Admitting: Certified Registered Nurse Anesthetist

## 2013-05-12 HISTORY — DX: Anemia, unspecified: D64.9

## 2013-05-12 HISTORY — DX: Major depressive disorder, single episode, unspecified: F32.9

## 2013-05-12 HISTORY — DX: Anxiety disorder, unspecified: F41.9

## 2013-05-12 HISTORY — PX: ANKLE FUSION: SHX5718

## 2013-05-12 LAB — GLUCOSE, CAPILLARY
GLUCOSE-CAPILLARY: 143 mg/dL — AB (ref 70–99)
GLUCOSE-CAPILLARY: 325 mg/dL — AB (ref 70–99)
Glucose-Capillary: 151 mg/dL — ABNORMAL HIGH (ref 70–99)
Glucose-Capillary: 242 mg/dL — ABNORMAL HIGH (ref 70–99)
Glucose-Capillary: 190 mg/dL — ABNORMAL HIGH (ref 70–99)

## 2013-05-12 LAB — SURGICAL PCR SCREEN
MRSA, PCR: NEGATIVE
Staphylococcus aureus: POSITIVE — AB

## 2013-05-12 SURGERY — ANKLE FUSION
Anesthesia: General | Site: Ankle | Laterality: Left

## 2013-05-12 MED ORDER — SODIUM CHLORIDE 0.9 % IV SOLN: INTRAVENOUS | Status: DC

## 2013-05-12 MED ORDER — OXYCODONE-ACETAMINOPHEN 5-325 MG PO TABS
1.0000 | ORAL_TABLET | ORAL | Status: DC | PRN
  Administered 2013-05-12: 2 via ORAL
  Administered 2013-05-12: 1 via ORAL
  Administered 2013-05-12 – 2013-05-13 (×3): 2 via ORAL
  Administered 2013-05-13 – 2013-05-14 (×3): 1 via ORAL
  Filled 2013-05-12 (×2): qty 2
  Filled 2013-05-12: qty 1
  Filled 2013-05-12 (×3): qty 2
  Filled 2013-05-12 (×2): qty 1
  Filled 2013-05-12: qty 2

## 2013-05-12 MED ORDER — FENTANYL CITRATE 0.05 MG/ML IJ SOLN
INTRAMUSCULAR | Status: AC
  Filled 2013-05-12: qty 5

## 2013-05-12 MED ORDER — HYDROMORPHONE HCL PF 1 MG/ML IJ SOLN: 0.5000 mg | INTRAMUSCULAR | Status: DC | PRN

## 2013-05-12 MED ORDER — METOCLOPRAMIDE HCL 10 MG PO TABS: 5.0000 mg | ORAL_TABLET | Freq: Three times a day (TID) | ORAL | Status: DC | PRN

## 2013-05-12 MED ORDER — AMITRIPTYLINE HCL 25 MG PO TABS: 25.0000 mg | ORAL_TABLET | Freq: Every day | ORAL | Status: DC

## 2013-05-12 MED ORDER — PROPOFOL 10 MG/ML IV BOLUS
INTRAVENOUS | Status: AC
  Filled 2013-05-12: qty 20

## 2013-05-12 MED ORDER — FENTANYL CITRATE 0.05 MG/ML IJ SOLN
INTRAMUSCULAR | Status: DC | PRN
  Administered 2013-05-12 (×2): 25 ug via INTRAVENOUS
  Administered 2013-05-12: 50 ug via INTRAVENOUS

## 2013-05-12 MED ORDER — LACTATED RINGERS IV SOLN
INTRAVENOUS | Status: DC | PRN
  Administered 2013-05-12 (×2): via INTRAVENOUS

## 2013-05-12 MED ORDER — MIDAZOLAM HCL 2 MG/2ML IJ SOLN
INTRAMUSCULAR | Status: AC
  Filled 2013-05-12: qty 2

## 2013-05-12 MED ORDER — INSULIN ASPART 100 UNIT/ML ~~LOC~~ SOLN
4.0000 [IU] | Freq: Three times a day (TID) | SUBCUTANEOUS | Status: DC
  Administered 2013-05-13 – 2013-05-14 (×3): 4 [IU] via SUBCUTANEOUS

## 2013-05-12 MED ORDER — GENTAMICIN SULFATE 40 MG/ML IJ SOLN
INTRAMUSCULAR | Status: AC
  Filled 2013-05-12: qty 4

## 2013-05-12 MED ORDER — CLOPIDOGREL BISULFATE 75 MG PO TABS
75.0000 mg | ORAL_TABLET | Freq: Every day | ORAL | Status: DC
  Administered 2013-05-13 – 2013-05-14 (×2): 75 mg via ORAL
  Filled 2013-05-12 (×3): qty 1

## 2013-05-12 MED ORDER — INSULIN NPH (HUMAN) (ISOPHANE) 100 UNIT/ML ~~LOC~~ SUSP
35.0000 [IU] | Freq: Every day | SUBCUTANEOUS | Status: DC
  Administered 2013-05-12 – 2013-05-13 (×2): 35 [IU] via SUBCUTANEOUS
  Filled 2013-05-12 (×2): qty 10

## 2013-05-12 MED ORDER — 0.9 % SODIUM CHLORIDE (POUR BTL) OPTIME
TOPICAL | Status: DC | PRN
  Administered 2013-05-12: 1000 mL

## 2013-05-12 MED ORDER — GENTAMICIN SULFATE 40 MG/ML IJ SOLN
INTRAMUSCULAR | Status: DC | PRN
  Administered 2013-05-12: 240 mg

## 2013-05-12 MED ORDER — MIDAZOLAM HCL 5 MG/5ML IJ SOLN
INTRAMUSCULAR | Status: DC | PRN
  Administered 2013-05-12: 2 mg via INTRAVENOUS

## 2013-05-12 MED ORDER — METOPROLOL TARTRATE 25 MG PO TABS
25.0000 mg | ORAL_TABLET | Freq: Two times a day (BID) | ORAL | Status: DC
  Administered 2013-05-12 – 2013-05-14 (×4): 25 mg via ORAL
  Filled 2013-05-12 (×6): qty 1

## 2013-05-12 MED ORDER — ONDANSETRON HCL 4 MG/2ML IJ SOLN
INTRAMUSCULAR | Status: AC
  Filled 2013-05-12: qty 2

## 2013-05-12 MED ORDER — METFORMIN HCL 500 MG PO TABS
500.0000 mg | ORAL_TABLET | Freq: Three times a day (TID) | ORAL | Status: DC
  Administered 2013-05-12 – 2013-05-14 (×8): 500 mg via ORAL
  Filled 2013-05-12 (×12): qty 1

## 2013-05-12 MED ORDER — VANCOMYCIN HCL 1000 MG IV SOLR
INTRAVENOUS | Status: AC
  Filled 2013-05-12: qty 1000

## 2013-05-12 MED ORDER — PHENYLEPHRINE HCL 10 MG/ML IJ SOLN
INTRAMUSCULAR | Status: DC | PRN
  Administered 2013-05-12 (×3): 40 ug via INTRAVENOUS

## 2013-05-12 MED ORDER — LIDOCAINE HCL (CARDIAC) 20 MG/ML IV SOLN
INTRAVENOUS | Status: DC | PRN
  Administered 2013-05-12: 100 mg via INTRAVENOUS

## 2013-05-12 MED ORDER — FAMOTIDINE 10 MG PO TABS
10.0000 mg | ORAL_TABLET | Freq: Every day | ORAL | Status: DC
  Administered 2013-05-12 – 2013-05-14 (×3): 10 mg via ORAL
  Filled 2013-05-12 (×3): qty 1

## 2013-05-12 MED ORDER — ONDANSETRON HCL 4 MG/2ML IJ SOLN
4.0000 mg | Freq: Four times a day (QID) | INTRAMUSCULAR | Status: DC | PRN
Start: 2013-05-12 — End: 2013-05-14

## 2013-05-12 MED ORDER — POLYSACCHARIDE IRON COMPLEX 150 MG PO CAPS
150.0000 mg | ORAL_CAPSULE | Freq: Every day | ORAL | Status: DC
  Administered 2013-05-12 – 2013-05-14 (×3): 150 mg via ORAL
  Filled 2013-05-12 (×3): qty 1

## 2013-05-12 MED ORDER — ARTIFICIAL TEARS OP OINT
TOPICAL_OINTMENT | OPHTHALMIC | Status: AC
  Filled 2013-05-12: qty 3.5

## 2013-05-12 MED ORDER — PIPERACILLIN-TAZOBACTAM 3.375 G IVPB
3.3750 g | Freq: Three times a day (TID) | INTRAVENOUS | Status: DC
  Administered 2013-05-12 – 2013-05-14 (×6): 3.375 g via INTRAVENOUS
  Filled 2013-05-12 (×9): qty 50

## 2013-05-12 MED ORDER — CLOPIDOGREL BISULFATE 75 MG PO TABS: 75.0000 mg | ORAL_TABLET | Freq: Every day | ORAL | Status: DC

## 2013-05-12 MED ORDER — PROPOFOL 10 MG/ML IV BOLUS
INTRAVENOUS | Status: DC | PRN
  Administered 2013-05-12: 150 mg via INTRAVENOUS

## 2013-05-12 MED ORDER — GENTAMICIN SULFATE 40 MG/ML IJ SOLN
INTRAMUSCULAR | Status: AC
  Filled 2013-05-12: qty 2

## 2013-05-12 MED ORDER — VANCOMYCIN HCL 1000 MG IV SOLR
INTRAVENOUS | Status: DC | PRN
  Administered 2013-05-12: 1000 mg

## 2013-05-12 MED ORDER — ASPIRIN 81 MG PO CHEW
81.0000 mg | CHEWABLE_TABLET | Freq: Every day | ORAL | Status: DC
  Administered 2013-05-12 – 2013-05-14 (×3): 81 mg via ORAL
  Filled 2013-05-12 (×3): qty 1

## 2013-05-12 MED ORDER — ATORVASTATIN CALCIUM 80 MG PO TABS
80.0000 mg | ORAL_TABLET | Freq: Every day | ORAL | Status: DC
  Administered 2013-05-12 – 2013-05-13 (×2): 80 mg via ORAL
  Filled 2013-05-12 (×3): qty 1

## 2013-05-12 MED ORDER — ONDANSETRON HCL 4 MG PO TABS: 4.0000 mg | ORAL_TABLET | Freq: Four times a day (QID) | ORAL | Status: DC | PRN

## 2013-05-12 MED ORDER — AMITRIPTYLINE HCL 25 MG PO TABS
25.0000 mg | ORAL_TABLET | Freq: Every day | ORAL | Status: DC
  Administered 2013-05-12 – 2013-05-13 (×2): 25 mg via ORAL
  Filled 2013-05-12 (×3): qty 1

## 2013-05-12 MED ORDER — VANCOMYCIN HCL IN DEXTROSE 1-5 GM/200ML-% IV SOLN
1000.0000 mg | Freq: Two times a day (BID) | INTRAVENOUS | Status: DC
  Administered 2013-05-12 – 2013-05-14 (×4): 1000 mg via INTRAVENOUS
  Filled 2013-05-12 (×6): qty 200

## 2013-05-12 MED ORDER — QUINAPRIL HCL 10 MG PO TABS
40.0000 mg | ORAL_TABLET | Freq: Every day | ORAL | Status: DC
  Administered 2013-05-12 – 2013-05-14 (×3): 40 mg via ORAL
  Filled 2013-05-12 (×3): qty 4

## 2013-05-12 MED ORDER — PHENYLEPHRINE 40 MCG/ML (10ML) SYRINGE FOR IV PUSH (FOR BLOOD PRESSURE SUPPORT)
PREFILLED_SYRINGE | INTRAVENOUS | Status: AC
  Filled 2013-05-12: qty 10

## 2013-05-12 MED ORDER — VANCOMYCIN HCL 500 MG IV SOLR
INTRAVENOUS | Status: AC
  Filled 2013-05-12: qty 500

## 2013-05-12 MED ORDER — INSULIN ASPART 100 UNIT/ML ~~LOC~~ SOLN: 0.0000 [IU] | Freq: Three times a day (TID) | SUBCUTANEOUS | Status: DC

## 2013-05-12 MED ORDER — INSULIN ASPART 100 UNIT/ML ~~LOC~~ SOLN
0.0000 [IU] | Freq: Three times a day (TID) | SUBCUTANEOUS | Status: DC
Start: 2013-05-13 — End: 2013-05-14
  Administered 2013-05-13: 5 [IU] via SUBCUTANEOUS
  Administered 2013-05-13: 3 [IU] via SUBCUTANEOUS
  Administered 2013-05-14: 2 [IU] via SUBCUTANEOUS

## 2013-05-12 MED ORDER — MUPIROCIN 2 % EX OINT
TOPICAL_OINTMENT | Freq: Two times a day (BID) | CUTANEOUS | Status: DC
  Administered 2013-05-12 – 2013-05-14 (×5): via NASAL
  Filled 2013-05-12: qty 22

## 2013-05-12 MED ORDER — SERTRALINE HCL 100 MG PO TABS
100.0000 mg | ORAL_TABLET | Freq: Two times a day (BID) | ORAL | Status: DC
  Administered 2013-05-12 – 2013-05-14 (×4): 100 mg via ORAL
  Filled 2013-05-12 (×5): qty 1

## 2013-05-12 MED ORDER — ONDANSETRON HCL 4 MG/2ML IJ SOLN
INTRAMUSCULAR | Status: DC | PRN
  Administered 2013-05-12: 4 mg via INTRAVENOUS

## 2013-05-12 MED ORDER — METOCLOPRAMIDE HCL 5 MG/ML IJ SOLN
5.0000 mg | Freq: Three times a day (TID) | INTRAMUSCULAR | Status: DC | PRN
Start: 2013-05-12 — End: 2013-05-14

## 2013-05-12 MED ORDER — SERTRALINE HCL 100 MG PO TABS: 100.0000 mg | ORAL_TABLET | Freq: Two times a day (BID) | ORAL | Status: DC

## 2013-05-12 SURGICAL SUPPLY — 56 items
BANDAGE ESMARK 6X9 LF (GAUZE/BANDAGES/DRESSINGS) ×1 IMPLANT
BANDAGE GAUZE ELAST BULKY 4 IN (GAUZE/BANDAGES/DRESSINGS) IMPLANT
BEAD TIP GUIDEWIRE 2.6MM X 80CM ×2 IMPLANT
BIT DRILL CALIBRATED 4.3X320MM (BIT) ×1 IMPLANT
BIT DRILL CANN 7X200 (BIT) ×2 IMPLANT
BLADE SAW SGTL HD 18.5X60.5X1. (BLADE) ×2 IMPLANT
BLADE SURG 10 STRL SS (BLADE) ×2 IMPLANT
BNDG COHESIVE 4X5 TAN STRL (GAUZE/BANDAGES/DRESSINGS) IMPLANT
BNDG ESMARK 6X9 LF (GAUZE/BANDAGES/DRESSINGS) ×2
CANISTER WOUND CARE 500ML ATS (WOUND CARE) ×2 IMPLANT
COVER MAYO STAND STRL (DRAPES) IMPLANT
COVER SURGICAL LIGHT HANDLE (MISCELLANEOUS) ×2 IMPLANT
DRAPE INCISE IOBAN 66X45 STRL (DRAPES) ×4 IMPLANT
DRAPE OEC MINIVIEW 54X84 (DRAPES) ×2 IMPLANT
DRAPE U-SHAPE 47X51 STRL (DRAPES) ×2 IMPLANT
DRILL CALIBRATED 4.3X320MM (BIT) ×2
DRSG ADAPTIC 3X8 NADH LF (GAUZE/BANDAGES/DRESSINGS) IMPLANT
DRSG VAC ATS SM SENSATRAC (GAUZE/BANDAGES/DRESSINGS) ×2 IMPLANT
DURAPREP 26ML APPLICATOR (WOUND CARE) ×2 IMPLANT
ELECT REM PT RETURN 9FT ADLT (ELECTROSURGICAL) ×2
ELECTRODE REM PT RTRN 9FT ADLT (ELECTROSURGICAL) ×1 IMPLANT
GLOVE BIOGEL PI IND STRL 7.5 (GLOVE) ×2 IMPLANT
GLOVE BIOGEL PI IND STRL 9 (GLOVE) ×1 IMPLANT
GLOVE BIOGEL PI INDICATOR 7.5 (GLOVE) ×2
GLOVE BIOGEL PI INDICATOR 9 (GLOVE) ×1
GLOVE SURG ORTHO 9.0 STRL STRW (GLOVE) ×2 IMPLANT
GLOVE SURG SS PI 6.0 STRL IVOR (GLOVE) ×2 IMPLANT
GLOVE SURG SS PI 6.5 STRL IVOR (GLOVE) ×2 IMPLANT
GLOVE SURG SS PI 7.0 STRL IVOR (GLOVE) ×2 IMPLANT
GOWN STRL REUS W/ TWL LRG LVL3 (GOWN DISPOSABLE) ×1 IMPLANT
GOWN STRL REUS W/ TWL XL LVL3 (GOWN DISPOSABLE) ×2 IMPLANT
GOWN STRL REUS W/TWL LRG LVL3 (GOWN DISPOSABLE) ×1
GOWN STRL REUS W/TWL XL LVL3 (GOWN DISPOSABLE) ×2
GUIDEWIRE 2.6X80 BEAD TIP (Wire) ×1 IMPLANT
GUIDEWIRE LAGSCREW 3.2X460 (WIRE) ×2 IMPLANT
GUIDWIRE 2.6X80 BEAD TIP (Wire) ×2 IMPLANT
KIT BASIN OR (CUSTOM PROCEDURE TRAY) ×2 IMPLANT
KIT ROOM TURNOVER OR (KITS) ×2 IMPLANT
KIT STIMULAN RAPID CURE  10CC (Orthopedic Implant) ×1 IMPLANT
KIT STIMULAN RAPID CURE 10CC (Orthopedic Implant) ×1 IMPLANT
NAIL LOCK ANKLE ANTE 11X180 (Nail) ×2 IMPLANT
NS IRRIG 1000ML POUR BTL (IV SOLUTION) ×2 IMPLANT
PACK ORTHO EXTREMITY (CUSTOM PROCEDURE TRAY) ×2 IMPLANT
PAD ARMBOARD 7.5X6 YLW CONV (MISCELLANEOUS) ×4 IMPLANT
SCREW CORT TI DBL LEAD 5X26 (Screw) ×2 IMPLANT
SCREW CORT TI DBL LEAD 5X60 (Screw) ×2 IMPLANT
SCREW CORT TI DBLE LEAD 5X28 (Screw) ×2 IMPLANT
SPONGE GAUZE 4X4 12PLY (GAUZE/BANDAGES/DRESSINGS) IMPLANT
SPONGE LAP 18X18 X RAY DECT (DISPOSABLE) ×2 IMPLANT
SUT ETHILON 2 0 PSLX (SUTURE) IMPLANT
SYR CONTROL 10ML LL (SYRINGE) ×2 IMPLANT
TOWEL OR 17X24 6PK STRL BLUE (TOWEL DISPOSABLE) IMPLANT
TOWEL OR 17X26 10 PK STRL BLUE (TOWEL DISPOSABLE) ×2 IMPLANT
TUBE CONNECTING 12X1/4 (SUCTIONS) ×2 IMPLANT
WATER STERILE IRR 1000ML POUR (IV SOLUTION) IMPLANT
YANKAUER SUCT BULB TIP NO VENT (SUCTIONS) ×2 IMPLANT

## 2013-05-12 NOTE — Preoperative (Signed)
Beta Blockers   Reason not to administer Beta Blockers:Not Applicable 

## 2013-05-12 NOTE — Progress Notes (Signed)
Orthopedic Tech Progress Note Patient Details:  [name] Jul 17, 1955 [id number]  Ortho Devices Type of Ortho Device: Postop shoe/boot Ortho Device/Splint Interventions: Application   Cammer, Theodoro Parma 05/12/2013, 3:26 PM

## 2013-05-12 NOTE — Op Note (Signed)
OPERATIVE REPORT  DATE OF SURGERY: 05/12/2013  PATIENT:  [name],  58 y.o. female  PRE-OPERATIVE DIAGNOSIS:  Open Charcot Collapse Left Ankle  POST-OPERATIVE DIAGNOSIS:  Open Charcot Collapse Left Ankle  PROCEDURE:  Procedure(s): LEFT TIBIOCALCANEAL FUSION with a 10 x 180 mm nail Biomet Extensive debridement and excision of bone from the distal tibia talus. Placement of antibiotic beads with 1 g vancomycin and 240 mg gentamicin. Local tissue rearrangement for wound measuring 10 x 5 cm. Application of wound VAC.  SURGEON:  Surgeon(s): Newt Minion, MD  ANESTHESIA:   general  EBL:  min ML  SPECIMEN:  No Specimen  TOURNIQUET:  * No tourniquets in log *  PROCEDURE DETAILS: Patient is a 59 year old woman who sustained a minimally displaced Weber B. fibular fracture she was ambulating in her posterior splint and had complete collapse of the ankle with Charcot collapse. Due to her neuropathy she had no sensation within the bone protruded through the skin. Patient initially seen with Dr. Mardelle Matte was seen in my office she was admitted urgently placed on vancomycin and Zosyn and presents at this time for limb salvage. Risks and benefits were discussed including persistent infection nonhealing of the bone not healing the skin potential for transtibial amputation. Patient states she understands and wished to proceed at this time. Description of procedure patient was brought to the operating room and underwent a general anesthetic. After adequate levels and anesthesia obtained patient's left lower extremity was prepped using DuraPrep and draped into a sterile field do to the wound being over the medial malleolus a medial incision was made to ellipse out the necrotic wound. The edges were undermined to allow for tissue advancement. The distal aspect the tibia and proximal talus were debridement so the ankle could be placed at 90. There was significant amount of ischemic changes in the bone.  Guidewire was inserted from the calcaneus into the tibia this is overreamed to 11 mm for a 10 mm nail. The nail was inserted and locked into the calcaneus and talus and the neck and the screw was locked into the nail. The jig was then used to lock the nail proximally. C-arm fluoroscopy verified reduction of both AP and lateral planes. The local tissue rearrangement was performed and the incision was closed with 2-0 nylon. Antibiotic beads were placed deep within the wound 10 cc of stimulant beads. The wounds were covered with a wound VAC set to -75 mm of suction. This had a good suction fit. Patient was extubated taken to the PACU in stable condition.  PLAN OF CARE: Admit to inpatient   PATIENT DISPOSITION:  PACU - hemodynamically stable.   Newt Minion, MD 05/12/2013 9:03 AM

## 2013-05-12 NOTE — Anesthesia Postprocedure Evaluation (Signed)
Anesthesia Post Note  Patient: [name]  Procedure(s) Performed: Procedure(s) (LRB): LEFT TIBIOCALCANEAL FUSION (Left)  Anesthesia type: general  Patient location: PACU  Post pain: Pain level controlled  Post assessment: Patient's Cardiovascular Status Stable  Last Vitals:  Filed Vitals:   05/12/13 0915  BP: 121/70  Pulse: 95  Temp: 36.5 C  Resp: 11    Post vital signs: Reviewed and stable  Level of consciousness: sedated  Complications: No apparent anesthesia complications

## 2013-05-12 NOTE — Anesthesia Procedure Notes (Signed)
Procedure Name: LMA Insertion Date/Time: 05/12/2013 7:45 AM Performed by: Maryland Pink Pre-anesthesia Checklist: Patient identified, Timeout performed, Emergency Drugs available, Suction available and Patient being monitored Patient Re-evaluated:Patient Re-evaluated prior to inductionOxygen Delivery Method: Circle system utilized Preoxygenation: Pre-oxygenation with 100% oxygen Intubation Type: IV induction LMA: LMA inserted LMA Size: 4.0 Number of attempts: 1 Placement Confirmation: positive ETCO2 and breath sounds checked- equal and bilateral Tube secured with: Tape Dental Injury: Teeth and Oropharynx as per pre-operative assessment

## 2013-05-12 NOTE — H&P (Signed)
[name] is an 58 y.o. female.   Chief Complaint: Open Charcot collapse left ankle with exposed medial malleolus HPI: Patient is a 58 year old woman who initially sustained a minimally displaced Weber B. fibular fracture left ankle. Patient has been ambulating in a posterior splint was seen by Dr. Mardelle Matte and was referred urgently to my office. Patient had exposed medial malleolus with open wound radiographs showed extensive Charcot destructive collapse about the ankle with 9 congruence of any joint surface and complete destruction of the bones. Do to the [facility] collapse patient was admitted started on IV antibiotics and presents for surgical intervention.  Past Medical History  Diagnosis Date  . Diabetes mellitus   . Hypertension   . Neuropathy   . Peripheral vascular disease   . Coronary artery disease   . Anxiety   . Depression   . Anemia     Past Surgical History  Procedure Laterality Date  . Leg surgery    . Foot surgery    . Below knee leg amputation  Jan. 2008    Right leg  . Carotid endarterectomy    . Tonsillectomy    . Cholecystectomy    . Coronary artery bypass graft  12/01/2007    Family History  Problem Relation Age of Onset  . Cancer Mother   . Heart disease Father   . Diabetes Father   . Hyperlipidemia Father   . Hypertension Father    Social History:  reports that she has never smoked. She has never used smokeless tobacco. She reports that she does not drink alcohol or use illicit drugs.  Allergies:  Allergies  Allergen Reactions  . Tape Rash    Redness, rash, itchiness    Medications Prior to Admission  Medication Sig Dispense Refill  . amitriptyline (ELAVIL) 25 MG tablet Take 25 mg by mouth at bedtime.        Marland Kitchen aspirin 81 MG tablet Take 81 mg by mouth daily.        Marland Kitchen atorvastatin (LIPITOR) 80 MG tablet Take 80 mg by mouth daily.        . clopidogrel (PLAVIX) 75 MG tablet Take 75 mg by mouth daily.       . insulin NPH (HUMULIN N,NOVOLIN N) 100  UNIT/ML injection Inject 25 Units into the skin every morning.        . insulin NPH (HUMULIN N,NOVOLIN N) 100 UNIT/ML injection Inject 35 Units into the skin at bedtime.        . metFORMIN (GLUCOPHAGE) 500 MG tablet Take 500 mg by mouth 4 (four) times daily.        . metoprolol tartrate (LOPRESSOR) 25 MG tablet Take 25 mg by mouth 2 (two) times daily.       . Multiple Vitamins-Minerals (MULTIVITAMINS THER. W/MINERALS) TABS Take 1 tablet by mouth daily.        . polysaccharide iron (NIFEREX) 150 MG CAPS capsule Take 150 mg by mouth daily.        . quinapril (ACCUPRIL) 40 MG tablet Take 40 mg by mouth daily.        . ranitidine (ZANTAC) 150 MG tablet Take 150 mg by mouth 2 (two) times daily.       . sertraline (ZOLOFT) 100 MG tablet Take 100 mg by mouth 2 (two) times daily.         Results for orders placed during the hospital encounter of 05/11/13 (from the past 48 hour(s))  BASIC METABOLIC PANEL  Status: Abnormal   Collection Time    05/11/13  7:30 PM      Result Value Ref Range   Sodium 137  137 - 147 mEq/L   Potassium 4.5  3.7 - 5.3 mEq/L   Chloride 97  96 - 112 mEq/L   CO2 26  19 - 32 mEq/L   Glucose, Bld 184 (*) 70 - 99 mg/dL   BUN 17  6 - 23 mg/dL   Creatinine, Ser 1.13 (*) 0.50 - 1.10 mg/dL   Calcium 9.5  8.4 - 10.5 mg/dL   GFR calc non Af Amer 54 (*) >90 mL/min   GFR calc Af Amer 62 (*) >90 mL/min   Comment: (NOTE)     The eGFR has been calculated using the CKD EPI equation.     This calculation has not been validated in all clinical situations.     eGFR's persistently <90 mL/min signify possible Chronic Kidney     Disease.  CBC     Status: Abnormal   Collection Time    05/11/13  7:30 PM      Result Value Ref Range   WBC 6.6  4.0 - 10.5 K/uL   RBC 2.91 (*) 3.87 - 5.11 MIL/uL   Hemoglobin 8.1 (*) 12.0 - 15.0 g/dL   HCT 24.3 (*) 36.0 - 46.0 %   MCV 83.5  78.0 - 100.0 fL   MCH 27.8  26.0 - 34.0 pg   MCHC 33.3  30.0 - 36.0 g/dL   RDW 15.5  11.5 - 15.5 %   Platelets  287  150 - 400 K/uL  GLUCOSE, CAPILLARY     Status: Abnormal   Collection Time    05/11/13  9:47 PM      Result Value Ref Range   Glucose-Capillary 191 (*) 70 - 99 mg/dL  SURGICAL PCR SCREEN     Status: Abnormal   Collection Time    05/11/13 10:57 PM      Result Value Ref Range   MRSA, PCR NEGATIVE  NEGATIVE   Staphylococcus aureus POSITIVE (*) NEGATIVE   Comment:            The Xpert SA Assay (FDA     approved for NASAL specimens     in patients over 57 years of age),     is one component of     a comprehensive surveillance     program.  Test performance has     been validated by [employer] for patients greater     than or equal to 32 year old.     It is not intended     to diagnose infection nor to     guide or monitor treatment.  GLUCOSE, CAPILLARY     Status: Abnormal   Collection Time    05/12/13  5:40 AM      Result Value Ref Range   Glucose-Capillary 151 (*) 70 - 99 mg/dL   Dg Chest 2 View  05/11/2013   CLINICAL DATA:  Preop for fracture repair.  Hypertension.  EXAM: CHEST  2 VIEW  COMPARISON:  04/01/2011  FINDINGS: Changes from CABG surgery are stable. The cardiac silhouette is normal in size and configuration. Normal mediastinal and hilar contours. Clear lungs. No pleural effusion or pneumothorax.  The bony thorax is demineralized but intact.  IMPRESSION: No acute cardiopulmonary disease.   Electronically Signed   By: Lajean Manes M.D.   On: 05/11/2013 21:20  Review of Systems  All other systems reviewed and are negative.    Blood pressure 147/57, pulse 93, temperature 98.6 F (37 C), temperature source Oral, resp. rate 18, height '5\' 5"'  (1.651 m), weight 83.643 kg (184 lb 6.4 oz), SpO2 98.00%. Physical Exam  On examination patient has a strong dopplerable dorsalis pedis pulse biphasic. She has exposed bone of the medial malleolus radiograph shows extensive destruction of Charcot arthropathy. Assessment/Plan Assessment: Diabetic insensate neuropathy with  extensive destruction Charcot arthropathy left ankle with osteomyelitis with exposed medial malleolus.  Plan: Discussed with the patient recommendation to proceed with a transtibial amputation. Discussed that we could consider limb salvage surgery with tibial calcaneal fusion but discussed that there is risk for persistent infection and potential need for transtibial amputation the future. Patient also is amputation of toes 23 and 4. Patient states she understands and wished to proceed with limb salvage surgery. We will admit the patient urgently started vancomycin and Zosyn and plan for surgical intervention in the morning.  Hildegarde Dunaway V 05/12/2013, 6:44 AM

## 2013-05-12 NOTE — Anesthesia Preprocedure Evaluation (Addendum)
Anesthesia Evaluation  Patient identified by MRN, date of birth, ID band Patient awake    Reviewed: Allergy & Precautions, H&P , NPO status , Patient's Chart, lab work & pertinent test results, reviewed documented beta blocker date and time   History of Anesthesia Complications Negative for: history of anesthetic complications  Airway Mallampati: I TM Distance: >3 FB Neck ROM: Full    Dental  (+) Teeth Intact, Dental Advisory Given   Pulmonary neg pulmonary ROS,    Pulmonary exam normal       Cardiovascular hypertension, Pt. on medications and Pt. on home beta blockers + CAD, + CABG and + Peripheral Vascular Disease  S/p CABG 2011   Neuro/Psych Anxiety Depression Dizziness and fainting prior to Carotid endarterectomy 2011     GI/Hepatic Neg liver ROS, GERD-  Medicated and Controlled,  Endo/Other  diabetes, Well Controlled, Type 2, Insulin Dependent  Renal/GU negative Renal ROS     Musculoskeletal   Abdominal   Peds  Hematology  (+) anemia ,   Anesthesia Other Findings   Reproductive/Obstetrics                          Anesthesia Physical Anesthesia Plan  ASA: III  Anesthesia Plan: General   Post-op Pain Management:    Induction: Intravenous  Airway Management Planned: LMA  Additional Equipment:   Intra-op Plan:   Post-operative Plan: Extubation in OR  Informed Consent: I have reviewed the patients History and Physical, chart, labs and discussed the procedure including the risks, benefits and alternatives for the proposed anesthesia with the patient or authorized representative who has indicated his/her understanding and acceptance.   Dental advisory given  Plan Discussed with: Surgeon and CRNA  Anesthesia Plan Comments:        Anesthesia Quick Evaluation

## 2013-05-12 NOTE — Progress Notes (Signed)
Utilization review completed. Aramis Zobel, RN, BSN. 

## 2013-05-12 NOTE — Transfer of Care (Signed)
Immediate Anesthesia Transfer of Care Note  Patient: [name]  Procedure(s) Performed: Procedure(s) with comments: LEFT TIBIOCALCANEAL FUSION (Left) - Left Tibiocalcaneal Fusion  Patient Location: PACU  Anesthesia Type:General  Level of Consciousness: awake, alert  and oriented  Airway & Oxygen Therapy: Patient Spontanous Breathing and Patient connected to nasal cannula oxygen  Post-op Assessment: Report given to PACU RN and Post -op Vital signs reviewed and stable  Post vital signs: Reviewed and stable  Complications: No apparent anesthesia complications

## 2013-05-13 DIAGNOSIS — E11610 Type 2 diabetes mellitus with diabetic neuropathic arthropathy: Secondary | ICD-10-CM | POA: Diagnosis present

## 2013-05-13 LAB — GLUCOSE, CAPILLARY
GLUCOSE-CAPILLARY: 219 mg/dL — AB (ref 70–99)
Glucose-Capillary: 195 mg/dL — ABNORMAL HIGH (ref 70–99)
Glucose-Capillary: 159 mg/dL — ABNORMAL HIGH (ref 70–99)

## 2013-05-13 MED ORDER — OXYCODONE-ACETAMINOPHEN 5-325 MG PO TABS: 1.0000 | ORAL_TABLET | ORAL | Status: DC | PRN

## 2013-05-13 NOTE — Progress Notes (Signed)
Vancomycin and zosyn per pharmacy  2/26 Surgery for Open Charcot Collapse Left Ankle with LEFT TIBIOCALCANEAL FUSION   Infectious Disease: Vanc/Zosyn for L foot infection. Afebrile. WBC 6.6 on 02/25  Vanco>> Zosyn>>  Vancomycin trough goal 10-15  Nephrology; SCr 1.13 (CrCl ~60) on 02/25; UOP 0.4 ml/kg/hr  Plan:  1) Vancomycin 1gm IV q12h.  Per note, may be discharged home this weekend.  If not need trough on Sunday morning. 2) Zosyn 3.375gm IV q8h. Each dose over 4 hours 3) Will f/u renal function, micro data, pt's clinical condition, trough prn

## 2013-05-13 NOTE — Progress Notes (Signed)
Orthopedic Tech Progress Note Patient Details:  [name] 1955/06/15 [id number]  Ortho Devices Type of Ortho Device: CAM walker Ortho Device/Splint Interventions: Application   [name] 05/13/2013, 11:28 AM

## 2013-05-13 NOTE — Progress Notes (Signed)
Patient ID: [name], female   DOB: December 21, 1955, 58 y.o.   MRN: [id number] Wound VAC removed. Wound edges well approximated no swelling minimal serosanguineous drainage. Mepilex and Ace applied. Patient is to begin physical therapy progressive ambulation nonweightbearing on the right. Patient needs a cam boot walker on for gait training. She will need the cam boot walker for discharge to home. Prescriptions on chart for Percocet for pain anticipate discharge to home this weekend.

## 2013-05-13 NOTE — Evaluation (Signed)
Physical Therapy Evaluation Patient Details Name: [name] MRN: [id number] DOB: 12/01/1955 Today's Date: 05/13/2013 Time: [id number] PT Time Calculation (min): 24 min  PT Assessment / Plan / Recommendation History of Present Illness  pt s/p LEFT TIBIOCALCANEAL FUSION   Clinical Impression  Pt limited on eval by impaired cognition ? Due to pain meds.  Pt required cues for attention, safety, motor planning and sequencing.  Pt mod A with mobility and will require 24 hour care at d/c.    PT Assessment  Patient needs continued PT services    Follow Up Recommendations  Supervision/Assistance - 24 hour;Home health PT;SNF (SNF if no 24 hour assistance available)    Does the patient have the potential to tolerate intense rehabilitation      Barriers to Discharge Decreased caregiver support no 24 hour care available    Equipment Recommendations  Wheelchair cushion (measurements PT);Wheelchair (measurements PT) (18x18)    Recommendations for Other Services OT consult   Frequency Min 4X/week    Precautions / Restrictions Precautions Precautions: Fall Required Braces or Orthoses:  (cam boot on L) Restrictions Weight Bearing Restrictions: Yes LLE Weight Bearing: Non weight bearing   Pertinent Vitals/Pain No c/o pain      Mobility  Bed Mobility Overal bed mobility: Needs Assistance Bed Mobility: Supine to Sit Supine to sit: Mod assist General bed mobility comments: increased time and cuing, frequent cues for attention to task Transfers Overall transfer level: Needs assistance Transfers: Squat Pivot Transfers Squat pivot transfers: Mod assist General transfer comment: pt transfer to recliner with R LE prosthesis donned.  Pt needs max cuing and is still unable to maintain NWB LLE.  Pt agitated throughout session, decreased frustration tolerance    Exercises     PT Diagnosis: Generalized weakness  PT Problem List: Decreased strength;Decreased mobility;Decreased safety  awareness;Decreased activity tolerance;Decreased coordination;Decreased balance;Decreased cognition PT Treatment Interventions: Gait training;Functional mobility training;Therapeutic activities;Patient/family education;Cognitive remediation;Neuromuscular re-education;Modalities;Balance training;DME instruction;Therapeutic exercise;Wheelchair mobility training     PT Goals(Current goals can be found in the care plan section) Acute Rehab PT Goals Patient Stated Goal: none stated PT Goal Formulation: With patient/family Time For Goal Achievement: 05/20/13 Potential to Achieve Goals: Good  Visit Information  Last PT Received On: 05/13/13 Assistance Needed: +1 History of Present Illness: pt s/p LEFT TIBIOCALCANEAL FUSION        Prior Functioning  Home Living Family/patient expects to be discharged to:: Private residence Living Arrangements: Spouse/significant other;Children Available Help at Discharge: Family;Available PRN/intermittently Type of Home: House Home Access: Ramped entrance Home Layout: One level Home Equipment: Walker - 2 wheels Additional Comments: states she needs new w/c Prior Function Level of Independence: Independent with assistive device(s) Communication Communication: No difficulties    Cognition  Cognition Arousal/Alertness: Lethargic Behavior During Therapy: Flat affect Overall Cognitive Status: Impaired/Different from baseline Area of Impairment: Attention;Following commands General Comments: pt had just taken pain meds, pt requires increased time for processing, following commands and sequencing    Extremity/Trunk Assessment Upper Extremity Assessment Upper Extremity Assessment: Generalized weakness Lower Extremity Assessment Lower Extremity Assessment: Generalized weakness Cervical / Trunk Assessment Cervical / Trunk Assessment: Normal   Balance Balance Overall balance assessment: Needs assistance Sitting balance-Leahy Scale: Fair Sitting balance  - Comments: needs supervision for safety with seated balance, requires increased time and cuing for sequencing and safety to don prosthesis Postural control: Posterior lean  End of Session PT - End of Session Equipment Utilized During Treatment:  (cam boot, prosthesis) Activity Tolerance: Patient limited by lethargy Patient  left: in chair;with call bell/phone within reach;with family/visitor present Nurse Communication: Mobility status  GP     Hae Ahlers,[place] 05/13/2013, 11:34 AM

## 2013-05-14 LAB — GLUCOSE, CAPILLARY
GLUCOSE-CAPILLARY: 144 mg/dL — AB (ref 70–99)
GLUCOSE-CAPILLARY: 114 mg/dL — AB (ref 70–99)
Glucose-Capillary: 128 mg/dL — ABNORMAL HIGH (ref 70–99)

## 2013-05-14 NOTE — Progress Notes (Signed)
Subjective: 2 Days Post-Op Procedure(s) (LRB): LEFT TIBIOCALCANEAL FUSION (Left) Patient reports pain as 1 on 0-10 scale.    Objective: Vital signs in last 24 hours: Temp:  [97.8 F (36.6 C)-98.5 F (36.9 C)] 98.5 F (36.9 C) (02/28 0507) Pulse Rate:  [42-113] 113 (02/28 0507) Resp:  [16] 16 (02/28 0507) BP: (94-129)/(47-76) 129/47 mmHg (02/28 0507) SpO2:  [94 %-97 %] 97 % (02/28 0507)  Intake/Output from previous day: 02/27 0701 - 02/28 0700 In: 2222.7 [P.O.:1200; I.V.:472.7; IV Piggyback:550] Out: 50 [Drains:50] Intake/Output this shift: Total I/O In: 240 [P.O.:240] Out: -    Recent Labs  05/11/13 1930  HGB 8.1*    Recent Labs  05/11/13 1930  WBC 6.6  RBC 2.91*  HCT 24.3*  PLT 287    Recent Labs  05/11/13 1930  NA 137  K 4.5  CL 97  CO2 26  BUN 17  CREATININE 1.13*  GLUCOSE 184*  CALCIUM 9.5   No results found for this basename: LABPT, INR,  in the last 72 hours  No edema left foot-has DM neuropathy with limited sensibility Assessment/Plan: 2 Days Post-Op Procedure(s) (LRB): LEFT TIBIOCALCANEAL FUSION (Left) Discharge home  Joni Fears W 05/14/2013, 10:33 AM

## 2013-05-14 NOTE — Progress Notes (Signed)
Physical Therapy Treatment Patient Details Name: [name] MRN: [id number] DOB: 09-25-1955 Today's Date: 05/14/2013 Time: 1450-1509 PT Time Calculation (min): 19 min  PT Assessment / Plan / Recommendation  History of Present Illness pt s/p LEFT TIBIOCALCANEAL FUSION    PT Comments   Pt/family educated on importance of maintaining NWB on left leg until MD says to add weight bearing for optimal healing and to salvage limb post surgery. Educated on scoot transfers to increase safety at home with mobility. Educated on how to properly doff/don cam boot.   Follow Up Recommendations  Supervision/Assistance - 24 hour;Home health PT;SNF (snf if no 24 hour assistance available)     Equipment Recommendations  Wheelchair cushion (measurements PT);Wheelchair (measurements PT)    Recommendations for Other Services OT consult  Frequency Min 4X/week   Progress towards PT Goals Progress towards PT goals: Progressing toward goals  Plan Current plan remains appropriate    Precautions / Restrictions Precautions Precautions: Fall Required Braces or Orthoses: Other Brace/Splint Other Brace/Splint: cam boot to left LE; prosthesis to right LE Restrictions LLE Weight Bearing: Non weight bearing    Mobility  Bed Mobility Bed Mobility: Supine to Sit Supine to sit: Min assist General bed mobility comments: increased time and multimodal cues for sequence and technique to get to edge of bed. Transfers Overall transfer level: Needs assistance Equipment used: Rolling walker (2 wheeled) Transfers: Sit to/from Stand;Lateral/Scoot Transfers Sit to Stand: Mod assist  Lateral/Scoot Transfers: Min guard General transfer comment: pt stood x2 with RW with mod assist and demo'd heavy posterior lean with each stand, PTA foot under pt's left foot to lift and maintain NWB. without assistance pt placing full weight on left foot. pt/family then educated on scoot pivot transfer's to use at home if only one person  will be available to assist pt. educated pt/family to stand with RW only when 2 people are available for safety.                             PT Goals (current goals can now be found in the care plan section) Acute Rehab PT Goals Patient Stated Goal: none stated PT Goal Formulation: With patient/family Time For Goal Achievement: 05/20/13  Visit Information  Last PT Received On: 05/14/13 Assistance Needed: +1 History of Present Illness: pt s/p LEFT TIBIOCALCANEAL FUSION     Subjective Data  Patient Stated Goal: none stated   Cognition  Cognition Arousal/Alertness: Awake/alert Behavior During Therapy: Flat affect Overall Cognitive Status: Difficult to assess Area of Impairment: Following commands;Safety/judgement;Awareness;Problem solving;Memory Current Attention Level: Sustained Memory: Decreased recall of precautions;Decreased short-term memory Following Commands: Follows one step commands with increased time General Comments: pt's family present today for session: pt with difficulty processing information and following commands. increased time needed with multimodal cues for tasks/mobility. Family able to follow and understand all directions and did not seem concerned that pt was different than normal.              End of Session PT - End of Session Equipment Utilized During Treatment: Gait belt Activity Tolerance: Patient tolerated treatment well Patient left: in bed;with family/visitor present;with call bell/phone within reach Nurse Communication: Mobility status   GP     Willow Ora 05/14/2013, 3:34 PM  Willow Ora, PTA Office- [phone]

## 2013-05-16 ENCOUNTER — Encounter (HOSPITAL_COMMUNITY): Payer: Self-pay | Admitting: Orthopedic Surgery

## 2013-05-16 NOTE — Discharge Summary (Signed)
Physician Discharge Summary  Patient ID: CYRAH BARRINGER MRN: [id number] DOB/AGE: 19-Dec-1955 58 y.o.  Admit date: 05/11/2013 Discharge date: 05/16/2013  Admission Diagnoses: Open left ankle fracture dislocation Charcot arthropathy with diabetic insensate neuropathy  Discharge Diagnoses: Same Active Problems:   Charcot foot due to diabetes mellitus   Discharged Condition: stable  Hospital Course: Patient's hospital course was essentially unremarkable. She underwent surgical intervention for excision of exposed bone stabilization of the Charcot collapse with tibial calcaneal fusion. She underwent local tissue rearrangement and placement of a wound VAC. Postoperatively patient progressed well and was discharged to home in stable condition.  Consults: None  Significant Diagnostic Studies: labs: Routine labs  Treatments: surgery: See operative note  Discharge Exam: Blood pressure 93/49, pulse 90, temperature 97.5 F (36.4 C), temperature source Oral, resp. rate 18, height 5\' 5"  (1.651 m), weight 83.643 kg (184 lb 6.4 oz), SpO2 96.00%. Incision/Wound: dressing clean and dry  Disposition: 01-Home or Self Care  Discharge Orders   Future Appointments Provider Department Dept Phone   05/11/2014 2:00 PM [facility] [place] [phone]   05/11/2014 2:30 PM [facility] [place] [phone]   05/11/2014 3:00 PM [facility] [place] [id number]   05/11/2014 4:00 PM Elam Dutch, MD [facility] [phone]   Future Orders Complete By Expires   Call MD / Call 911  As directed    Comments:     If you experience chest pain or shortness of breath, CALL 911 and be transported to the hospital emergency room.  If you develope a fever above 101 F, pus (white drainage) or increased drainage or redness at the wound, or calf pain, call your surgeon's office.   Constipation  Prevention  As directed    Comments:     Drink plenty of fluids.  Prune juice may be helpful.  You may use a stool softener, such as Colace (over the counter) 100 mg twice a day.  Use MiraLax (over the counter) for constipation as needed.   Diet - low sodium heart healthy  As directed    Increase activity slowly as tolerated  As directed        Medication List         amitriptyline 25 MG tablet  Commonly known as:  ELAVIL  Take 25 mg by mouth at bedtime.     aspirin 81 MG tablet  Take 81 mg by mouth daily.     atorvastatin 80 MG tablet  Commonly known as:  LIPITOR  Take 80 mg by mouth daily.     clopidogrel 75 MG tablet  Commonly known as:  PLAVIX  Take 75 mg by mouth daily.     insulin NPH Human 100 UNIT/ML injection  Commonly known as:  HUMULIN N,NOVOLIN N  Inject 25 Units into the skin every morning.     insulin NPH Human 100 UNIT/ML injection  Commonly known as:  HUMULIN N,NOVOLIN N  Inject 35 Units into the skin at bedtime.     metFORMIN 500 MG tablet  Commonly known as:  GLUCOPHAGE  Take 500 mg by mouth 4 (four) times daily.     metoprolol tartrate 25 MG tablet  Commonly known as:  LOPRESSOR  Take 25 mg by mouth 2 (two) times daily.     multivitamins ther. w/minerals Tabs tablet  Take 1 tablet by mouth daily.     oxyCODONE-acetaminophen 5-325 MG per tablet  Commonly known as:  ROXICET  Take 1 tablet by mouth every 4 (four) hours as needed for severe pain.     polysaccharide iron 150 MG capsule  Generic drug:  iron polysaccharides  Take 150 mg by mouth daily.     quinapril 40 MG tablet  Commonly known as:  ACCUPRIL  Take 40 mg by mouth daily.     ranitidine 150 MG tablet  Commonly known as:  ZANTAC  Take 150 mg by mouth 2 (two) times daily.     sertraline 100 MG tablet  Commonly known as:  ZOLOFT  Take 100 mg by mouth 2 (two) times daily.         Signed: Solimar Maiden V 05/16/2013, 7:10 AM

## 2013-05-17 ENCOUNTER — Other Ambulatory Visit: Payer: Self-pay | Admitting: *Deleted

## 2013-05-17 ENCOUNTER — Encounter: Payer: Self-pay | Admitting: Vascular Surgery

## 2013-05-17 DIAGNOSIS — S82409A Unspecified fracture of shaft of unspecified fibula, initial encounter for closed fracture: Secondary | ICD-10-CM

## 2013-06-10 ENCOUNTER — Other Ambulatory Visit (HOSPITAL_COMMUNITY): Payer: Self-pay | Admitting: Orthopedic Surgery

## 2013-06-14 ENCOUNTER — Encounter (HOSPITAL_COMMUNITY): Payer: Self-pay | Admitting: Pharmacy Technician

## 2013-06-16 ENCOUNTER — Encounter (HOSPITAL_COMMUNITY): Payer: Self-pay | Admitting: *Deleted

## 2013-06-16 ENCOUNTER — Other Ambulatory Visit (HOSPITAL_COMMUNITY): Payer: Self-pay | Admitting: Orthopedic Surgery

## 2013-06-16 MED ORDER — CEFAZOLIN SODIUM-DEXTROSE 2-3 GM-% IV SOLR
2.0000 g | INTRAVENOUS | Status: DC
  Filled 2013-06-16: qty 50

## 2013-06-16 NOTE — Progress Notes (Signed)
Pt's PCP ( Dr. Amanda Cockayne in [place], [place]) follows pt's heart hx. Does not see a cardiologist any more. Pt denies any heart issues at the present time.  Will call and request last OV note from him and most recent EKG that he has.

## 2013-06-16 NOTE — Progress Notes (Signed)
Pt notified of surgery time change. Pt instructed to be here at 10:30 AM tomorrow morning. She voiced understanding.

## 2013-06-17 ENCOUNTER — Ambulatory Visit (HOSPITAL_COMMUNITY): Payer: Medicare Other | Admitting: Certified Registered Nurse Anesthetist

## 2013-06-17 ENCOUNTER — Inpatient Hospital Stay (HOSPITAL_COMMUNITY)
Admission: RE | Admit: 2013-06-17 | Discharge: 2013-06-20 | DRG: 622 | Disposition: A | Discharge status: Home / Self Care | Payer: Medicare Other | Source: Ambulatory Visit | Attending: Orthopedic Surgery | Admitting: Orthopedic Surgery

## 2013-06-17 ENCOUNTER — Encounter (HOSPITAL_COMMUNITY)
Admission: RE | Disposition: A | Discharge status: Home / Self Care | Payer: Self-pay | Source: Ambulatory Visit | Attending: Orthopedic Surgery

## 2013-06-17 ENCOUNTER — Encounter (HOSPITAL_COMMUNITY): Payer: Self-pay | Admitting: Certified Registered Nurse Anesthetist

## 2013-06-17 ENCOUNTER — Encounter (HOSPITAL_COMMUNITY): Payer: Medicare Other | Admitting: Certified Registered Nurse Anesthetist

## 2013-06-17 DIAGNOSIS — M86179 Other acute osteomyelitis, unspecified ankle and foot: Secondary | ICD-10-CM | POA: Diagnosis present

## 2013-06-17 DIAGNOSIS — I10 Essential (primary) hypertension: Secondary | ICD-10-CM | POA: Diagnosis present

## 2013-06-17 DIAGNOSIS — M86172 Other acute osteomyelitis, left ankle and foot: Secondary | ICD-10-CM | POA: Diagnosis present

## 2013-06-17 DIAGNOSIS — F3289 Other specified depressive episodes: Secondary | ICD-10-CM | POA: Diagnosis present

## 2013-06-17 DIAGNOSIS — K219 Gastro-esophageal reflux disease without esophagitis: Secondary | ICD-10-CM | POA: Diagnosis present

## 2013-06-17 DIAGNOSIS — D62 Acute posthemorrhagic anemia: Secondary | ICD-10-CM | POA: Diagnosis not present

## 2013-06-17 DIAGNOSIS — Z7982 Long term (current) use of aspirin: Secondary | ICD-10-CM

## 2013-06-17 DIAGNOSIS — S88119A Complete traumatic amputation at level between knee and ankle, unspecified lower leg, initial encounter: Secondary | ICD-10-CM

## 2013-06-17 DIAGNOSIS — E1149 Type 2 diabetes mellitus with other diabetic neurological complication: Secondary | ICD-10-CM | POA: Diagnosis present

## 2013-06-17 DIAGNOSIS — I739 Peripheral vascular disease, unspecified: Secondary | ICD-10-CM | POA: Diagnosis present

## 2013-06-17 DIAGNOSIS — Z794 Long term (current) use of insulin: Secondary | ICD-10-CM

## 2013-06-17 DIAGNOSIS — L0889 Other specified local infections of the skin and subcutaneous tissue: Secondary | ICD-10-CM | POA: Diagnosis present

## 2013-06-17 DIAGNOSIS — E1169 Type 2 diabetes mellitus with other specified complication: Principal | ICD-10-CM | POA: Diagnosis present

## 2013-06-17 DIAGNOSIS — Z6828 Body mass index (BMI) 28.0-28.9, adult: Secondary | ICD-10-CM

## 2013-06-17 DIAGNOSIS — F411 Generalized anxiety disorder: Secondary | ICD-10-CM | POA: Diagnosis present

## 2013-06-17 DIAGNOSIS — Z79899 Other long term (current) drug therapy: Secondary | ICD-10-CM

## 2013-06-17 DIAGNOSIS — E43 Unspecified severe protein-calorie malnutrition: Secondary | ICD-10-CM | POA: Insufficient documentation

## 2013-06-17 DIAGNOSIS — M9080 Osteopathy in diseases classified elsewhere, unspecified site: Secondary | ICD-10-CM | POA: Diagnosis present

## 2013-06-17 DIAGNOSIS — F329 Major depressive disorder, single episode, unspecified: Secondary | ICD-10-CM | POA: Diagnosis present

## 2013-06-17 DIAGNOSIS — I2510 Atherosclerotic heart disease of native coronary artery without angina pectoris: Secondary | ICD-10-CM | POA: Diagnosis present

## 2013-06-17 HISTORY — DX: Adverse effect of unspecified anesthetic, initial encounter: T41.45XA

## 2013-06-17 HISTORY — PX: I & D EXTREMITY: SHX5045

## 2013-06-17 HISTORY — DX: Other complications of anesthesia, initial encounter: T88.59XA

## 2013-06-17 HISTORY — PX: IRRIGATION AND DEBRIDEMENT ABSCESS: SHX5252

## 2013-06-17 HISTORY — DX: Other acute osteomyelitis, left ankle and foot: M86.172

## 2013-06-17 HISTORY — DX: Gastro-esophageal reflux disease without esophagitis: K21.9

## 2013-06-17 HISTORY — PX: APPLICATION OF WOUND VAC: SHX5189

## 2013-06-17 LAB — CBC
HCT: 19.5 % — ABNORMAL LOW (ref 36.0–46.0)
HEMOGLOBIN: 6.1 g/dL — AB (ref 12.0–15.0)
MCH: 24.7 pg — ABNORMAL LOW (ref 26.0–34.0)
MCHC: 31.3 g/dL (ref 30.0–36.0)
MCV: 78.9 fL (ref 78.0–100.0)
Platelets: 329 10*3/uL (ref 150–400)
RBC: 2.47 MIL/uL — ABNORMAL LOW (ref 3.87–5.11)
RDW: 17.6 % — AB (ref 11.5–15.5)
WBC: 7.2 10*3/uL (ref 4.0–10.5)

## 2013-06-17 LAB — COMPREHENSIVE METABOLIC PANEL
ALBUMIN: 2.3 g/dL — AB (ref 3.5–5.2)
ALK PHOS: 165 U/L — AB (ref 39–117)
ALT: 11 U/L (ref 0–35)
AST: 12 U/L (ref 0–37)
BILIRUBIN TOTAL: 0.3 mg/dL (ref 0.3–1.2)
BUN: 19 mg/dL (ref 6–23)
CHLORIDE: 94 meq/L — AB (ref 96–112)
CO2: 22 mEq/L (ref 19–32)
Calcium: 8.9 mg/dL (ref 8.4–10.5)
Creatinine, Ser: 1.3 mg/dL — ABNORMAL HIGH (ref 0.50–1.10)
GFR calc Af Amer: 53 mL/min — ABNORMAL LOW (ref >90)
GFR calc non Af Amer: 45 mL/min — ABNORMAL LOW (ref >90)
GLUCOSE: 177 mg/dL — AB (ref 70–99)
POTASSIUM: 5 meq/L (ref 3.7–5.3)
SODIUM: 132 meq/L — AB (ref 137–147)
Total Protein: 6.9 g/dL (ref 6.0–8.3)

## 2013-06-17 LAB — GLUCOSE, CAPILLARY
Glucose-Capillary: 163 mg/dL — ABNORMAL HIGH (ref 70–99)
Glucose-Capillary: 161 mg/dL — ABNORMAL HIGH (ref 70–99)
Glucose-Capillary: 144 mg/dL — ABNORMAL HIGH (ref 70–99)
Glucose-Capillary: 169 mg/dL — ABNORMAL HIGH (ref 70–99)

## 2013-06-17 LAB — APTT: APTT: 36 s (ref 24–37)

## 2013-06-17 LAB — PROTIME-INR
INR: 1.2 (ref 0.00–1.49)
Prothrombin Time: 14.9 seconds (ref 11.6–15.2)

## 2013-06-17 LAB — PREPARE RBC (CROSSMATCH)

## 2013-06-17 SURGERY — IRRIGATION AND DEBRIDEMENT EXTREMITY
Anesthesia: General | Site: Ankle | Laterality: Left

## 2013-06-17 MED ORDER — HYDROMORPHONE HCL PF 1 MG/ML IJ SOLN: 0.2500 mg | INTRAMUSCULAR | Status: DC | PRN

## 2013-06-17 MED ORDER — MIDAZOLAM HCL 5 MG/5ML IJ SOLN
INTRAMUSCULAR | Status: DC | PRN
  Administered 2013-06-17: 2 mg via INTRAVENOUS

## 2013-06-17 MED ORDER — LISINOPRIL 40 MG PO TABS
40.0000 mg | ORAL_TABLET | Freq: Every day | ORAL | Status: DC
  Administered 2013-06-17 – 2013-06-20 (×4): 40 mg via ORAL
  Filled 2013-06-17 (×4): qty 1

## 2013-06-17 MED ORDER — HYDROMORPHONE HCL PF 1 MG/ML IJ SOLN: 0.5000 mg | INTRAMUSCULAR | Status: DC | PRN

## 2013-06-17 MED ORDER — GENTAMICIN SULFATE 40 MG/ML IJ SOLN
INTRAMUSCULAR | Status: AC
  Filled 2013-06-17: qty 6

## 2013-06-17 MED ORDER — SERTRALINE HCL 100 MG PO TABS
100.0000 mg | ORAL_TABLET | Freq: Two times a day (BID) | ORAL | Status: DC
  Administered 2013-06-17 – 2013-06-20 (×6): 100 mg via ORAL
  Filled 2013-06-17 (×7): qty 1

## 2013-06-17 MED ORDER — METFORMIN HCL 500 MG PO TABS
500.0000 mg | ORAL_TABLET | Freq: Three times a day (TID) | ORAL | Status: DC
  Administered 2013-06-17 – 2013-06-20 (×11): 500 mg via ORAL
  Filled 2013-06-17 (×15): qty 1

## 2013-06-17 MED ORDER — SODIUM CHLORIDE 0.9 % IV SOLN
INTRAVENOUS | Status: DC
  Administered 2013-06-18: 11:00:00 via INTRAVENOUS

## 2013-06-17 MED ORDER — METOCLOPRAMIDE HCL 10 MG PO TABS: 5.0000 mg | ORAL_TABLET | Freq: Three times a day (TID) | ORAL | Status: DC | PRN

## 2013-06-17 MED ORDER — FAMOTIDINE 10 MG PO TABS
10.0000 mg | ORAL_TABLET | Freq: Every day | ORAL | Status: DC
  Administered 2013-06-17 – 2013-06-20 (×4): 10 mg via ORAL
  Filled 2013-06-17 (×4): qty 1

## 2013-06-17 MED ORDER — ONDANSETRON HCL 4 MG/2ML IJ SOLN: 4.0000 mg | Freq: Once | INTRAMUSCULAR | Status: DC | PRN

## 2013-06-17 MED ORDER — ONDANSETRON HCL 4 MG/2ML IJ SOLN: 4.0000 mg | Freq: Four times a day (QID) | INTRAMUSCULAR | Status: DC | PRN

## 2013-06-17 MED ORDER — PHENYLEPHRINE 40 MCG/ML (10ML) SYRINGE FOR IV PUSH (FOR BLOOD PRESSURE SUPPORT)
PREFILLED_SYRINGE | INTRAVENOUS | Status: AC
  Filled 2013-06-17: qty 10

## 2013-06-17 MED ORDER — GENTAMICIN SULFATE 40 MG/ML IJ SOLN
INTRAMUSCULAR | Status: DC | PRN
  Administered 2013-06-17: 240 mg via INTRAMUSCULAR

## 2013-06-17 MED ORDER — ATORVASTATIN CALCIUM 80 MG PO TABS
80.0000 mg | ORAL_TABLET | Freq: Every day | ORAL | Status: DC
  Administered 2013-06-17 – 2013-06-19 (×3): 80 mg via ORAL
  Filled 2013-06-17 (×4): qty 1

## 2013-06-17 MED ORDER — 0.9 % SODIUM CHLORIDE (POUR BTL) OPTIME
TOPICAL | Status: DC | PRN
  Administered 2013-06-17: 1000 mL

## 2013-06-17 MED ORDER — OXYCODONE-ACETAMINOPHEN 5-325 MG PO TABS
1.0000 | ORAL_TABLET | ORAL | Status: DC | PRN
  Administered 2013-06-17 – 2013-06-20 (×9): 2 via ORAL
  Filled 2013-06-17 (×9): qty 2

## 2013-06-17 MED ORDER — METOPROLOL TARTRATE 25 MG PO TABS
25.0000 mg | ORAL_TABLET | Freq: Two times a day (BID) | ORAL | Status: DC
  Administered 2013-06-17 – 2013-06-20 (×6): 25 mg via ORAL
  Filled 2013-06-17 (×7): qty 1

## 2013-06-17 MED ORDER — LIDOCAINE HCL 1 % IJ SOLN
INTRAMUSCULAR | Status: DC | PRN
  Administered 2013-06-17: 60 mg via INTRADERMAL

## 2013-06-17 MED ORDER — PROPOFOL 10 MG/ML IV BOLUS
INTRAVENOUS | Status: AC
  Filled 2013-06-17: qty 20

## 2013-06-17 MED ORDER — ONDANSETRON HCL 4 MG/2ML IJ SOLN
INTRAMUSCULAR | Status: AC
  Filled 2013-06-17: qty 2

## 2013-06-17 MED ORDER — FENTANYL CITRATE 0.05 MG/ML IJ SOLN
INTRAMUSCULAR | Status: DC | PRN
  Administered 2013-06-17: 25 ug via INTRAVENOUS

## 2013-06-17 MED ORDER — FENTANYL CITRATE 0.05 MG/ML IJ SOLN
INTRAMUSCULAR | Status: AC
  Filled 2013-06-17: qty 5

## 2013-06-17 MED ORDER — CEFAZOLIN SODIUM 1-5 GM-% IV SOLN
1.0000 g | Freq: Four times a day (QID) | INTRAVENOUS | Status: AC
  Administered 2013-06-17 – 2013-06-18 (×3): 1 g via INTRAVENOUS
  Filled 2013-06-17 (×3): qty 50

## 2013-06-17 MED ORDER — DEXTROSE 5 % IV SOLN
500.0000 mg | Freq: Four times a day (QID) | INTRAVENOUS | Status: DC | PRN
  Filled 2013-06-17: qty 5

## 2013-06-17 MED ORDER — OXYCODONE HCL 5 MG PO TABS: 5.0000 mg | ORAL_TABLET | Freq: Once | ORAL | Status: DC | PRN

## 2013-06-17 MED ORDER — VANCOMYCIN HCL 500 MG IV SOLR
INTRAVENOUS | Status: AC
  Filled 2013-06-17: qty 500

## 2013-06-17 MED ORDER — PROPOFOL 10 MG/ML IV BOLUS
INTRAVENOUS | Status: DC | PRN
  Administered 2013-06-17: 150 mg via INTRAVENOUS

## 2013-06-17 MED ORDER — METOPROLOL TARTRATE 12.5 MG HALF TABLET
ORAL_TABLET | ORAL | Status: AC
  Filled 2013-06-17: qty 2

## 2013-06-17 MED ORDER — METHOCARBAMOL 500 MG PO TABS
500.0000 mg | ORAL_TABLET | Freq: Four times a day (QID) | ORAL | Status: DC | PRN
  Administered 2013-06-18 – 2013-06-19 (×5): 500 mg via ORAL
  Filled 2013-06-17 (×6): qty 1

## 2013-06-17 MED ORDER — SODIUM CHLORIDE 0.9 % IV SOLN
INTRAVENOUS | Status: DC | PRN
  Administered 2013-06-17: 15:00:00 via INTRAVENOUS

## 2013-06-17 MED ORDER — AMITRIPTYLINE HCL 25 MG PO TABS
25.0000 mg | ORAL_TABLET | Freq: Every evening | ORAL | Status: DC | PRN
  Filled 2013-06-17: qty 1

## 2013-06-17 MED ORDER — PHENYLEPHRINE HCL 10 MG/ML IJ SOLN
INTRAMUSCULAR | Status: DC | PRN
  Administered 2013-06-17: 160 ug via INTRAVENOUS

## 2013-06-17 MED ORDER — METOCLOPRAMIDE HCL 5 MG/ML IJ SOLN: 5.0000 mg | Freq: Three times a day (TID) | INTRAMUSCULAR | Status: DC | PRN

## 2013-06-17 MED ORDER — ONDANSETRON HCL 4 MG PO TABS: 4.0000 mg | ORAL_TABLET | Freq: Four times a day (QID) | ORAL | Status: DC | PRN

## 2013-06-17 MED ORDER — METOPROLOL TARTRATE 25 MG PO TABS
25.0000 mg | ORAL_TABLET | Freq: Once | ORAL | Status: AC
  Administered 2013-06-17: 25 mg via ORAL
  Filled 2013-06-17: qty 1

## 2013-06-17 MED ORDER — VANCOMYCIN HCL 500 MG IV SOLR
INTRAVENOUS | Status: DC | PRN
  Administered 2013-06-17: 500 mg

## 2013-06-17 MED ORDER — METOPROLOL TARTRATE 12.5 MG HALF TABLET
ORAL_TABLET | ORAL | Status: AC
  Filled 2013-06-17: qty 1

## 2013-06-17 MED ORDER — INSULIN ASPART 100 UNIT/ML ~~LOC~~ SOLN
0.0000 [IU] | Freq: Three times a day (TID) | SUBCUTANEOUS | Status: DC
  Administered 2013-06-17: 2 [IU] via SUBCUTANEOUS
  Administered 2013-06-18 (×3): 3 [IU] via SUBCUTANEOUS
  Administered 2013-06-19 (×2): 2 [IU] via SUBCUTANEOUS
  Administered 2013-06-19 – 2013-06-20 (×2): 3 [IU] via SUBCUTANEOUS

## 2013-06-17 MED ORDER — CLOPIDOGREL BISULFATE 75 MG PO TABS
75.0000 mg | ORAL_TABLET | Freq: Every day | ORAL | Status: DC
  Administered 2013-06-17 – 2013-06-20 (×4): 75 mg via ORAL
  Filled 2013-06-17 (×4): qty 1

## 2013-06-17 MED ORDER — MIDAZOLAM HCL 2 MG/2ML IJ SOLN
INTRAMUSCULAR | Status: AC
  Filled 2013-06-17: qty 2

## 2013-06-17 MED ORDER — ONDANSETRON HCL 4 MG/2ML IJ SOLN
INTRAMUSCULAR | Status: DC | PRN
  Administered 2013-06-17: 4 mg via INTRAVENOUS

## 2013-06-17 MED ORDER — LACTATED RINGERS IV SOLN
INTRAVENOUS | Status: DC
  Administered 2013-06-17: 12:00:00 via INTRAVENOUS

## 2013-06-17 MED ORDER — OXYCODONE HCL 5 MG/5ML PO SOLN: 5.0000 mg | Freq: Once | ORAL | Status: DC | PRN

## 2013-06-17 SURGICAL SUPPLY — 45 items
BLADE SURG 10 STRL SS (BLADE) IMPLANT
BNDG COHESIVE 4X5 TAN STRL (GAUZE/BANDAGES/DRESSINGS) IMPLANT
BNDG COHESIVE 6X5 TAN STRL LF (GAUZE/BANDAGES/DRESSINGS) IMPLANT
CANISTER WOUND CARE 500ML ATS (WOUND CARE) ×3 IMPLANT
COVER SURGICAL LIGHT HANDLE (MISCELLANEOUS) ×3 IMPLANT
CUFF TOURNIQUET SINGLE 18IN (TOURNIQUET CUFF) IMPLANT
CUFF TOURNIQUET SINGLE 24IN (TOURNIQUET CUFF) IMPLANT
CUFF TOURNIQUET SINGLE 34IN LL (TOURNIQUET CUFF) IMPLANT
CUFF TOURNIQUET SINGLE 44IN (TOURNIQUET CUFF) IMPLANT
DRAPE INCISE IOBAN 66X45 STRL (DRAPES) ×3 IMPLANT
DRAPE U-SHAPE 47X51 STRL (DRAPES) ×3 IMPLANT
DRSG ADAPTIC 3X8 NADH LF (GAUZE/BANDAGES/DRESSINGS) ×3 IMPLANT
DRSG VAC ATS SM SENSATRAC (GAUZE/BANDAGES/DRESSINGS) ×3 IMPLANT
DURAPREP 26ML APPLICATOR (WOUND CARE) ×3 IMPLANT
ELECT CAUTERY BLADE 6.4 (BLADE) IMPLANT
ELECT REM PT RETURN 9FT ADLT (ELECTROSURGICAL)
ELECTRODE REM PT RTRN 9FT ADLT (ELECTROSURGICAL) IMPLANT
GLOVE BIOGEL PI IND STRL 9 (GLOVE) ×2 IMPLANT
GLOVE BIOGEL PI INDICATOR 9 (GLOVE) ×1
GLOVE SURG ORTHO 9.0 STRL STRW (GLOVE) ×3 IMPLANT
GLOVE SURG SS PI 8.0 STRL IVOR (GLOVE) ×3 IMPLANT
GOWN STRL REUS W/ TWL XL LVL3 (GOWN DISPOSABLE) ×4 IMPLANT
GOWN STRL REUS W/TWL XL LVL3 (GOWN DISPOSABLE) ×2
HANDPIECE INTERPULSE COAX TIP (DISPOSABLE)
KIT BASIN OR (CUSTOM PROCEDURE TRAY) ×3 IMPLANT
KIT ROOM TURNOVER OR (KITS) ×3 IMPLANT
KIT STIMULAN RAPID CURE  10CC (Orthopedic Implant) ×1 IMPLANT
KIT STIMULAN RAPID CURE 10CC (Orthopedic Implant) ×2 IMPLANT
MANIFOLD NEPTUNE II (INSTRUMENTS) ×3 IMPLANT
NS IRRIG 1000ML POUR BTL (IV SOLUTION) ×3 IMPLANT
PACK ORTHO EXTREMITY (CUSTOM PROCEDURE TRAY) ×3 IMPLANT
PAD ARMBOARD 7.5X6 YLW CONV (MISCELLANEOUS) ×6 IMPLANT
PADDING CAST COTTON 6X4 STRL (CAST SUPPLIES) ×3 IMPLANT
SET HNDPC FAN SPRY TIP SCT (DISPOSABLE) IMPLANT
SPONGE GAUZE 4X4 12PLY (GAUZE/BANDAGES/DRESSINGS) ×3 IMPLANT
SPONGE LAP 18X18 X RAY DECT (DISPOSABLE) ×3 IMPLANT
STOCKINETTE IMPERVIOUS 9X36 MD (GAUZE/BANDAGES/DRESSINGS) IMPLANT
SUT ETHILON 2 0 PSLX (SUTURE) ×3 IMPLANT
TOWEL OR 17X24 6PK STRL BLUE (TOWEL DISPOSABLE) ×3 IMPLANT
TOWEL OR 17X26 10 PK STRL BLUE (TOWEL DISPOSABLE) ×3 IMPLANT
TUBE ANAEROBIC SPECIMEN COL (MISCELLANEOUS) IMPLANT
TUBE CONNECTING 12X1/4 (SUCTIONS) ×3 IMPLANT
UNDERPAD 30X30 INCONTINENT (UNDERPADS AND DIAPERS) ×3 IMPLANT
WATER STERILE IRR 1000ML POUR (IV SOLUTION) ×3 IMPLANT
YANKAUER SUCT BULB TIP NO VENT (SUCTIONS) ×3 IMPLANT

## 2013-06-17 NOTE — Transfer of Care (Signed)
Immediate Anesthesia Transfer of Care Note  Patient: [name]  Procedure(s) Performed: Procedure(s): IRRIGATION AND DEBRIDEMENT EXTREMITY, PLACEMENT ANTIBIOTIC STIMULAN BEADS (Left) APPLICATION OF WOUND VAC  Patient Location: PACU  Anesthesia Type:General  Level of Consciousness: awake, alert , oriented and patient cooperative  Airway & Oxygen Therapy: Patient Spontanous Breathing and Patient connected to face mask oxygen  Post-op Assessment: Report given to PACU RN, Post -op Vital signs reviewed and stable and Patient moving all extremities  Post vital signs: Reviewed and stable  Complications: No apparent anesthesia complications

## 2013-06-17 NOTE — Anesthesia Procedure Notes (Signed)
Procedure Name: LMA Insertion Date/Time: 06/17/2013 2:34 PM Performed by: Julian Reil Pre-anesthesia Checklist: Emergency Drugs available, Patient identified, Suction available and Patient being monitored Patient Re-evaluated:Patient Re-evaluated prior to inductionOxygen Delivery Method: Circle system utilized Preoxygenation: Pre-oxygenation with 100% oxygen Intubation Type: IV induction LMA: LMA inserted LMA Size: 4.0 Tube type: Oral Number of attempts: 1

## 2013-06-17 NOTE — Progress Notes (Signed)
Orthopedic Tech Progress Note Patient Details:  [name] 1955/11/04 [id number] Post op boot ;lle Patient ID: [name], female   DOB: June 11, 1955, 58 y.o.   MRN: [id number]   Hildred Priest 06/17/2013, 4:24 PM

## 2013-06-17 NOTE — Op Note (Signed)
OPERATIVE REPORT  DATE OF SURGERY: 06/17/2013  PATIENT:  [name],  58 y.o. female  PRE-OPERATIVE DIAGNOSIS:  Open Wound Left Ankle  POST-OPERATIVE DIAGNOSIS:  Open Wound Left Ankle  PROCEDURE:  Procedure(s): IRRIGATION AND DEBRIDEMENT EXTREMITY, PLACEMENT ANTIBIOTIC STIMULAN BEADS APPLICATION OF WOUND VAC Excisional debridement of skin soft tissue muscle and bone from the distal tibia. Local tissue rearrangement wound 5 x 6 cm SURGEON:  Surgeon(s): Newt Minion, MD  ANESTHESIA:   general  EBL:  Minimal ML  SPECIMEN:  No Specimen  TOURNIQUET:  * No tourniquets in log *  PROCEDURE DETAILS: Patient is a 58 year old woman with diabetic insensate neuropathy she is status post tibial calcaneal fusion force open Charcot collapse for foot salvage. Patient had breakdown of the wound medially where the bone had initially broken through the skin. She presents at this time for repeat irrigation and debridement. Risks and benefits were discussed including persistent infection need for additional surgery. Discussed the chance of limb salvage is about 50%. Description of procedure patient was brought to the operating room and underwent a general anesthetic. After adequate levels of anesthesia were obtained patient's left lower extremity was prepped using DuraPrep and draped into a sterile field. A timeout was called. An elliptical incision was made around the open wound. This left a wound that was 6 x 5 cm. Distal tibia was resected soft tissue was resected. The wound was irrigated with normal saline. Antibiotic beads were placed to fill the void with 10 cc of stimulant beads with 500 mg vancomycin and 240 mg of gentamicin. Local tissue rearrangement was then performed and the wound was closed using 2-0 nylon. Then was covered with a wound VAC sponge set to -75 mm of suction. Patient was extubated taken to the PACU in stable condition.  PLAN OF CARE: Admit to inpatient   PATIENT DISPOSITION:   PACU - hemodynamically stable.   Newt Minion, MD 06/17/2013 4:41 PM

## 2013-06-17 NOTE — Anesthesia Preprocedure Evaluation (Signed)
Anesthesia Evaluation  Patient identified by MRN, date of birth, ID band Patient awake    Reviewed: Allergy & Precautions, H&P , NPO status , Patient's Chart, lab work & pertinent test results, reviewed documented beta blocker date and time   Airway Mallampati: I TM Distance: >3 FB Neck ROM: Full    Dental  (+) Teeth Intact, Dental Advisory Given   Pulmonary  breath sounds clear to auscultation        Cardiovascular hypertension, Pt. on medications and Pt. on home beta blockers + CAD Rhythm:Regular Rate:Normal     Neuro/Psych    GI/Hepatic   Endo/Other  diabetes, Well Controlled, Type 2, Insulin Dependent, Oral Hypoglycemic Agents  Renal/GU      Musculoskeletal   Abdominal   Peds  Hematology   Anesthesia Other Findings   Reproductive/Obstetrics                           Anesthesia Physical Anesthesia Plan  ASA: III  Anesthesia Plan: General   Post-op Pain Management:    Induction: Intravenous  Airway Management Planned: LMA  Additional Equipment:   Intra-op Plan:   Post-operative Plan: Extubation in OR  Informed Consent: I have reviewed the patients History and Physical, chart, labs and discussed the procedure including the risks, benefits and alternatives for the proposed anesthesia with the patient or authorized representative who has indicated his/her understanding and acceptance.   Dental advisory given  Plan Discussed with: CRNA, Anesthesiologist and Surgeon  Anesthesia Plan Comments:         Anesthesia Quick Evaluation

## 2013-06-17 NOTE — Progress Notes (Signed)
Notified by lab of critical Hgb. Notified Dr. Sharol Given he advised to type and screen and transfuse 2 units. Dr. Sharol Given to come by and speak to patient regarding risk/ benefits of blood transfusions prior to starting same. Notified Dr. Oletta Lamas as well.

## 2013-06-17 NOTE — Progress Notes (Signed)
Per blood bank 45 minutes to 1 hour before blood is ready.

## 2013-06-17 NOTE — H&P (Signed)
[name] is an 58 y.o. female.   Chief Complaint: Infection left ankle HPI: Patient is a 58 year old woman with multiple medical problems with a left ankle infection.  Past Medical History  Diagnosis Date  . Hypertension   . Neuropathy   . Peripheral vascular disease   . Coronary artery disease   . Anxiety   . Depression   . Anemia   . Type II diabetes mellitus   . High cholesterol   . History of blood transfusion     "one time; related to diabetes I guess"   . Arthritis     "hands"  . GERD (gastroesophageal reflux disease)     takes Zantac  . Complication of anesthesia     pt felt like she had a hard time waking up after surgery in Feb. 2015.    Past Surgical History  Procedure Laterality Date  . Toe amputation Right     "took off a couple toes before the amputation"  . Below knee leg amputation Right Jan. 2008  . Carotid endarterectomy Right   . Cholecystectomy    . Ankle fusion Left 05/11/2013    TIBIOCALCANEAL FUSION   . Tonsillectomy    . Dilation and curettage of uterus    . Coronary artery bypass graft  12/01/2007    "CABG X4" (05/12/2013)  . Ankle fusion Left 05/12/2013    Procedure: LEFT TIBIOCALCANEAL FUSION;  Surgeon: Newt Minion, MD;  Location: [facility];  Service: Orthopedics;  Laterality: Left;  Left Tibiocalcaneal Fusion  . Colonoscopy      Family History  Problem Relation Age of Onset  . Cancer Mother   . Heart disease Father   . Diabetes Father   . Hyperlipidemia Father   . Hypertension Father    Social History:  reports that she has never smoked. She has never used smokeless tobacco. She reports that she does not drink alcohol or use illicit drugs.  Allergies:  Allergies  Allergen Reactions  . Tape Rash    Redness, rash, itchiness    No prescriptions prior to admission    No results found for this or any previous visit (from the past 48 hour(s)). No results found.  Review of Systems  All other systems reviewed and are  negative.    There were no vitals taken for this visit. Physical Exam   Assessment/Plan Assessment: Left ankle infection.  Plan: Will plan for irrigation and debridement of the ankle placement of antibiotic beads placement of a wound VAC. Risks and benefits were discussed including persistent infection. Patient states she understands was pursued this time.  [name] V 06/17/2013, 6:13 AM

## 2013-06-18 LAB — GLUCOSE, CAPILLARY
GLUCOSE-CAPILLARY: 178 mg/dL — AB (ref 70–99)
GLUCOSE-CAPILLARY: 123 mg/dL — AB (ref 70–99)
Glucose-Capillary: 172 mg/dL — ABNORMAL HIGH (ref 70–99)
Glucose-Capillary: 180 mg/dL — ABNORMAL HIGH (ref 70–99)

## 2013-06-18 LAB — CBC
HEMATOCRIT: 23.8 % — AB (ref 36.0–46.0)
Hemoglobin: 7.7 g/dL — ABNORMAL LOW (ref 12.0–15.0)
MCH: 26.2 pg (ref 26.0–34.0)
MCHC: 32.4 g/dL (ref 30.0–36.0)
MCV: 81 fL (ref 78.0–100.0)
Platelets: 291 10*3/uL (ref 150–400)
RBC: 2.94 MIL/uL — ABNORMAL LOW (ref 3.87–5.11)
RDW: 16.6 % — AB (ref 11.5–15.5)
WBC: 5.7 10*3/uL (ref 4.0–10.5)

## 2013-06-18 MED ORDER — GLUCERNA SHAKE PO LIQD
237.0000 mL | Freq: Every day | ORAL | Status: DC
  Administered 2013-06-18 – 2013-06-19 (×2): 237 mL via ORAL

## 2013-06-18 MED ORDER — SENNOSIDES-DOCUSATE SODIUM 8.6-50 MG PO TABS
1.0000 | ORAL_TABLET | Freq: Two times a day (BID) | ORAL | Status: DC
  Administered 2013-06-18 – 2013-06-20 (×4): 1 via ORAL
  Filled 2013-06-18 (×3): qty 1

## 2013-06-18 NOTE — Care Management Note (Signed)
CARE MANAGEMENT NOTE 06/18/2013  Patient:  JESELLE, KOSAKOWSKI   Account Number:  [id number]  Date Initiated:  06/18/2013  Documentation initiated by:  Ricki Miller  Subjective/Objective Assessment:   58 yr old female admitted with open left ankle wound.S/p I & D with wound vac application.     Action/Plan:   Case Manager placed [facility] authorization on chart. CM Will continue to monitor.   Anticipated DC Date:     Anticipated DC Plan:           Choice offered to / List presented to:             Status of service:  In process, will continue to follow

## 2013-06-18 NOTE — Evaluation (Signed)
Physical Therapy Evaluation Patient Details Name: NASRO GILLYARD MRN: [id number] DOB: 20-Mar-1955 Today's Date: 06/18/2013   History of Present Illness  admitted with open left ankle wound.S/p I & D with wound vac application.   Clinical Impression  Patient presents with unique situation of BKA on right and now NWB on left LE.  Patient typically uses a prosthetic on right, but does not want to bring it to the hospital (does not want it to get lost/damaged).  Therefore, limited treatment.  Patient did well with bed mobility and bed to chair transfer, with multiple reminders to maintain NWB.  Will see again once she brings in prosthetic for transfer/gait assessment.  Otherwise, will continue to be limited in what we can offer her.      Follow Up Recommendations No PT follow up    Equipment Recommendations  None recommended by PT    Recommendations for Other Services       Precautions / Restrictions Precautions Precautions: None Restrictions Weight Bearing Restrictions: Yes LLE Weight Bearing: Non weight bearing      Mobility  Bed Mobility Overal bed mobility: Independent                Transfers Overall transfer level: Needs assistance Equipment used: None Transfers: Lateral/Scoot Transfers          Lateral/Scoot Transfers: Min guard General transfer comment: required verbal cues and tactile cues to maintain NWB on left foot.  Ambulation/Gait                Stairs            Wheelchair Mobility    Modified Rankin (Stroke Patients Only)       Balance Overall balance assessment: Independent                                           Pertinent Vitals/Pain Denies pain    Home Living Family/patient expects to be discharged to:: Private residence Living Arrangements: Alone Available Help at Discharge: Family;Available PRN/intermittently Type of Home: House Home Access: Ramped entrance     Home Layout: One level Home  Equipment: Walker - 2 wheels;Wheelchair - manual;Shower seat      Prior Function Level of Independence: Independent with assistive device(s)               Hand Dominance        Extremity/Trunk Assessment   Upper Extremity Assessment: Overall WFL for tasks assessed           Lower Extremity Assessment: LLE deficits/detail   LLE Deficits / Details: did not test ankle and below.     Communication   Communication: No difficulties  Cognition Arousal/Alertness: Awake/alert Behavior During Therapy: WFL for tasks assessed/performed Overall Cognitive Status: Within Functional Limits for tasks assessed                      General Comments      Exercises        Assessment/Plan    PT Assessment Patient needs continued PT services  PT Diagnosis Difficulty walking   PT Problem List Decreased mobility;Decreased knowledge of precautions  PT Treatment Interventions DME instruction;Gait training;Functional mobility training;Therapeutic activities;Patient/family education   PT Goals (Current goals can be found in the Care Plan section) Acute Rehab PT Goals Patient Stated Goal: go home on Monday PT Goal Formulation: With  patient Time For Goal Achievement: 06/25/13 Potential to Achieve Goals: Good    Frequency Min 3X/week   Barriers to discharge        Co-evaluation               End of Session   Activity Tolerance: Patient tolerated treatment well Patient left: in bed;with call bell/phone within reach;with family/visitor present           Time: 1400-1415 PT Time Calculation (min): 15 min   Charges:   PT Evaluation $Initial PT Evaluation Tier I: 1 Procedure     PT G CodesShanna Cisco, [place] 06/18/2013, 2:26 PM

## 2013-06-18 NOTE — Progress Notes (Signed)
INITIAL NUTRITION ASSESSMENT  DOCUMENTATION CODES Per approved criteria  -Severe malnutrition in the context of chronic illness   INTERVENTION: Glucerna Shake po daily, each supplement provides 220 kcal and 10 grams of protein RD to follow for nutrition care plan  NUTRITION DIAGNOSIS: Increased nutrient needs related to post-op, wound healing as evidenced by estimated nutrition needs  Goal: Pt to meet >/= 90% of their estimated nutrition needs   Monitor:  PO & supplemental intake, weight, labs, I/O's  Reason for Assessment: Malnutrition Screening Tool Report  58 y.o. female  Admitting Dx: open wound left ankle  ASSESSMENT: Patient with PMH of HTN, CAD, DM, anemia; presented with left ankle infection.  Patient s/p procedures 4/3: IRRIGATION AND DEBRIDEMENT EXTREMITY, PLACEMENT ANTIBIOTIC STIMULAN BEADS  APPLICATION OF WOUND VAC  Excisional debridement of skin soft tissue muscle and bone from the distal tibia.   Patient reports her appetite is better, however, PTA was experiencing a decreased appetite for ~ 3 months; she reports there were days when she did not eat at all; pt believes limited appetite was due to her anemia and Hgb being low; has had a 6% weight loss x 2 weeks -- severe for time frame; amenable to Glucerna Shake daily.  No muscle or subcutaneous fat depletion noticed.  Patient meets criteria for severe malnutrition in the context of chronic illness as evidenced by < 75% intake of estimated energy requirement for > 1 month and 6% weight loss in < 1 month.  Height: Ht Readings from Last 1 Encounters:  06/17/13 5\' 5"  (1.651 m)    Weight: Wt Readings from Last 1 Encounters:  06/17/13 173 lb (78.472 kg)    Ideal Body Weight: 125 lb  % Ideal Body Weight: 91%  Wt Readings from Last 10 Encounters:  06/17/13 173 lb (78.472 kg)  06/17/13 173 lb (78.472 kg)  05/11/13 184 lb 6.4 oz (83.643 kg)  05/11/13 184 lb 6.4 oz (83.643 kg)  05/05/13 184 lb 6.4 oz  (83.643 kg)  07/25/12 190 lb (86.183 kg)  03/25/12 188 lb (85.276 kg)  09/16/11 185 lb 4.8 oz (84.052 kg)  07/20/11 185 lb (83.915 kg)  02/13/11 190 lb (86.183 kg)    Usual Body Weight: 184 lb  % Usual Body Weight: 94%  BMI:  Body mass index is 28.79 kg/(m^2).  Estimated Nutritional Needs: Kcal: 1900-2100 Protein: 100-110 gm Fluid: 1.9-2.1 L  Skin: wound VAC to left ankle  Diet Order: Carb Control  EDUCATION NEEDS: -No education needs identified at this time   Intake/Output Summary (Last 24 hours) at 06/18/13 1128 Last data filed at 06/18/13 0810  Gross per 24 hour  Intake   2180 ml  Output    150 ml  Net   2030 ml    Labs:   Recent Labs Lab 06/17/13 1130  NA 132*  K 5.0  CL 94*  CO2 22  BUN 19  CREATININE 1.30*  CALCIUM 8.9  GLUCOSE 177*    CBG (last 3)   Recent Labs  06/17/13 2139 06/18/13 0635 06/18/13 1059  GLUCAP 169* 178* 172*    Scheduled Meds: . atorvastatin  80 mg Oral q1800  . clopidogrel  75 mg Oral Daily  . famotidine  10 mg Oral Daily  . insulin aspart  0-15 Units Subcutaneous TID WC  . lisinopril  40 mg Oral Daily  . metFORMIN  500 mg Oral TID AC & HS  . metoprolol tartrate  25 mg Oral BID  . senna-docusate  1 tablet  Oral BID  . sertraline  100 mg Oral BID    Continuous Infusions: . sodium chloride 20 mL/hr at 06/18/13 1038    Past Medical History  Diagnosis Date  . Hypertension   . Neuropathy   . Peripheral vascular disease   . Coronary artery disease   . Anxiety   . Depression   . Anemia   . Type II diabetes mellitus   . High cholesterol   . History of blood transfusion     "one time; related to diabetes I guess"   . Arthritis     "hands"  . GERD (gastroesophageal reflux disease)     takes Zantac  . Complication of anesthesia     pt felt like she had a hard time waking up after surgery in Feb. 2015.    Past Surgical History  Procedure Laterality Date  . Toe amputation Right     "took off a couple toes  before the amputation"  . Below knee leg amputation Right Jan. 2008  . Carotid endarterectomy Right   . Cholecystectomy    . Ankle fusion Left 05/11/2013    TIBIOCALCANEAL FUSION   . Tonsillectomy    . Dilation and curettage of uterus    . Coronary artery bypass graft  12/01/2007    "CABG X4" (05/12/2013)  . Ankle fusion Left 05/12/2013    Procedure: LEFT TIBIOCALCANEAL FUSION;  Surgeon: Newt Minion, MD;  Location: [facility];  Service: Orthopedics;  Laterality: Left;  Left Tibiocalcaneal Fusion  . Colonoscopy    . Irrigation and debridement abscess Left 06/17/2013    ANKLE           DR Girard, RD, LDN Pager #: [phone] [place] Pager #: [phone]

## 2013-06-18 NOTE — Progress Notes (Addendum)
   Subjective:  Patient reports pain as mild.  No events.  Objective:   VITALS:   Filed Vitals:   06/17/13 1623 06/17/13 2013 06/18/13 0129 06/18/13 0539  BP:  135/54 136/45 142/57  Pulse:  89 83 89  Temp:  98.1 F (36.7 C) 98.2 F (36.8 C) 98.1 F (36.7 C)  TempSrc:  Oral Oral Oral  Resp:  16 16 18   Height:      Weight:      SpO2: 99% 99% 97% 94%    Neurologically intact Neurovascular intact Sensation intact distally Intact pulses distally Dorsiflexion/Plantar flexion intact Incision: dressing C/D/I, no drainage and iVAC with good seal and suction No cellulitis present Compartment soft foot wwp   Lab Results  Component Value Date   WBC 7.2 06/17/2013   HGB 6.1* 06/17/2013   HCT 19.5* 06/17/2013   MCV 78.9 06/17/2013   PLT 329 06/17/2013     Assessment/Plan:  1 Day Post-Op   - Expected postop acute blood loss anemia - will monitor for symptoms - will check posttransfusion CBC - Up with PT/OT - DVT ppx - SCDs, ambulation - NWB left and lower extremity  - Pain control - well controlled  Problem List Items Addressed This Visit   None       Marianna Payment 06/18/2013, 10:14 AM [phone]

## 2013-06-19 LAB — CBC WITH DIFFERENTIAL/PLATELET
BASOS ABS: 0 10*3/uL (ref 0.0–0.1)
Basophils Relative: 0 % (ref 0–1)
EOS ABS: 0.1 10*3/uL (ref 0.0–0.7)
Eosinophils Relative: 2 % (ref 0–5)
HEMATOCRIT: 21.3 % — AB (ref 36.0–46.0)
Hemoglobin: 6.7 g/dL — CL (ref 12.0–15.0)
LYMPHS PCT: 23 % (ref 12–46)
Lymphs Abs: 1.3 10*3/uL (ref 0.7–4.0)
MCH: 25.8 pg — ABNORMAL LOW (ref 26.0–34.0)
MCHC: 31.5 g/dL (ref 30.0–36.0)
MCV: 81.9 fL (ref 78.0–100.0)
Monocytes Absolute: 0.4 10*3/uL (ref 0.1–1.0)
Monocytes Relative: 8 % (ref 3–12)
NEUTROS PCT: 67 % (ref 43–77)
Neutro Abs: 3.7 10*3/uL (ref 1.7–7.7)
Platelets: 272 10*3/uL (ref 150–400)
RBC: 2.6 MIL/uL — ABNORMAL LOW (ref 3.87–5.11)
RDW: 16.7 % — AB (ref 11.5–15.5)
WBC: 5.5 10*3/uL (ref 4.0–10.5)

## 2013-06-19 LAB — GLUCOSE, CAPILLARY
GLUCOSE-CAPILLARY: 130 mg/dL — AB (ref 70–99)
GLUCOSE-CAPILLARY: 133 mg/dL — AB (ref 70–99)
GLUCOSE-CAPILLARY: 143 mg/dL — AB (ref 70–99)
Glucose-Capillary: 166 mg/dL — ABNORMAL HIGH (ref 70–99)

## 2013-06-19 LAB — PREPARE RBC (CROSSMATCH)

## 2013-06-19 NOTE — Progress Notes (Signed)
Pre-transfusion vs obtained, pre-transfusion teaching done, pt states no questions at this time r/t s&s of adverse reaction to blood products

## 2013-06-19 NOTE — Progress Notes (Signed)
2nd unit of lrprbc. [id number], no s/s of adverse reaction, VSS, no rash, redness,pain etc.

## 2013-06-19 NOTE — Progress Notes (Signed)
Subjective:  No events.  Objective:   VITALS:   Filed Vitals:   06/18/13 0539 06/18/13 1435 06/18/13 2206 06/19/13 0647  BP: 142/57 135/63 102/54 123/62  Pulse: 89 89 90 88  Temp: 98.1 F (36.7 C) 98.1 F (36.7 C) 98.3 F (36.8 C) 97.8 F (36.6 C)  TempSrc: Oral  Oral Oral  Resp: 18 16 16 16   Height:      Weight:      SpO2: 94% 98% 98% 98%    Neurologically intact Neurovascular intact Sensation intact distally Intact pulses distally Dorsiflexion/Plantar flexion intact Incision: dressing C/D/I, no drainage and iVAC with good seal and suction No cellulitis present Compartment soft foot wwp   Lab Results  Component Value Date   WBC 5.5 06/19/2013   HGB 6.7* 06/19/2013   HCT 21.3* 06/19/2013   MCV 81.9 06/19/2013   PLT 272 06/19/2013     Assessment/Plan:  2 Days Post-Op   - Expected postop acute blood loss anemia - will monitor for symptoms - Hg 6.7 will transfuse 2 units - Up with PT/OT - DVT ppx - SCDs, ambulation - NWB left and lower extremity  - Pain control - well controlled - continue VAC - possibly dc home tomorrow  Problem List Items Addressed This Visit   None       Marianna Payment 06/19/2013, 8:46 AM [phone]

## 2013-06-19 NOTE — Progress Notes (Signed)
Transfusion post 20 initial minutes. VSS, see doc flow. No s/s of adverse reaction. 20g RH patent and tolerating 125 cc/hr well

## 2013-06-19 NOTE — Progress Notes (Signed)
Ebony charge nurse and I tried multiple times to scan 2nd unit of prbc into computer unsuccessfully

## 2013-06-19 NOTE — Anesthesia Postprocedure Evaluation (Signed)
  Anesthesia Post-op Note  Patient: [name]  Procedure(s) Performed: Procedure(s): IRRIGATION AND DEBRIDEMENT EXTREMITY, PLACEMENT ANTIBIOTIC STIMULAN BEADS (Left) APPLICATION OF WOUND VAC  Patient Location: PACU  Anesthesia Type:General  Level of Consciousness: awake, alert  and oriented  Airway and Oxygen Therapy: Patient Spontanous Breathing  Post-op Pain: mild  Post-op Assessment: Post-op Vital signs reviewed  Post-op Vital Signs: Reviewed  Complications: No apparent anesthesia complications

## 2013-06-20 ENCOUNTER — Encounter (HOSPITAL_COMMUNITY): Payer: Self-pay | Admitting: Orthopedic Surgery

## 2013-06-20 DIAGNOSIS — E43 Unspecified severe protein-calorie malnutrition: Secondary | ICD-10-CM | POA: Insufficient documentation

## 2013-06-20 LAB — CBC WITH DIFFERENTIAL/PLATELET
BASOS ABS: 0 10*3/uL (ref 0.0–0.1)
BASOS PCT: 0 % (ref 0–1)
EOS ABS: 0.2 10*3/uL (ref 0.0–0.7)
Eosinophils Relative: 3 % (ref 0–5)
HCT: 30.5 % — ABNORMAL LOW (ref 36.0–46.0)
Hemoglobin: 10.2 g/dL — ABNORMAL LOW (ref 12.0–15.0)
Lymphocytes Relative: 18 % (ref 12–46)
Lymphs Abs: 1.3 10*3/uL (ref 0.7–4.0)
MCH: 27.5 pg (ref 26.0–34.0)
MCHC: 33.4 g/dL (ref 30.0–36.0)
MCV: 82.2 fL (ref 78.0–100.0)
Monocytes Absolute: 0.5 10*3/uL (ref 0.1–1.0)
Monocytes Relative: 7 % (ref 3–12)
NEUTROS ABS: 5 10*3/uL (ref 1.7–7.7)
NEUTROS PCT: 72 % (ref 43–77)
PLATELETS: 294 10*3/uL (ref 150–400)
RBC: 3.71 MIL/uL — ABNORMAL LOW (ref 3.87–5.11)
RDW: 16.3 % — AB (ref 11.5–15.5)
WBC: 6.9 10*3/uL (ref 4.0–10.5)

## 2013-06-20 LAB — TYPE AND SCREEN
ABO/RH(D): A POS
Antibody Screen: NEGATIVE
UNIT DIVISION: 0
UNIT DIVISION: 0
Unit division: 0
Unit division: 0

## 2013-06-20 LAB — GLUCOSE, CAPILLARY: GLUCOSE-CAPILLARY: 151 mg/dL — AB (ref 70–99)

## 2013-06-20 MED ORDER — OXYCODONE-ACETAMINOPHEN 5-325 MG PO TABS: 1.0000 | ORAL_TABLET | ORAL | Status: DC | PRN

## 2013-06-20 NOTE — Progress Notes (Signed)
Physical Therapy Treatment Patient Details Name: [name] MRN: [id number] DOB: 1955-08-14 Today's Date: 06/20/2013    History of Present Illness admitted with open left ankle wound.S/p I & D with wound vac application.     PT Comments    Pt tolerated treatment well, performing stand<>pivot transfer and ambulating a short distance of 6 feet using RW and prosthetic leg while adhering to NWB precautions on LLE. Pt advised to perform scoot pivot transfer as practiced over the weekend until MD clears NWB status as she does not safely ambulate/transfer with RW. Pt and husband state they understand and agree with this recommendation. Pt was instructed on home exercises to maintain strength and ROM.  Follow Up Recommendations  No PT follow up     Equipment Recommendations  None recommended by PT    Recommendations for Other Services       Precautions / Restrictions Precautions Precautions: None Restrictions Weight Bearing Restrictions: Yes LLE Weight Bearing: Non weight bearing    Mobility  Bed Mobility Overal bed mobility: Independent                Transfers Overall transfer level: Needs assistance Equipment used: Rolling walker (2 wheeled) Transfers: Sit to/from [employer] Transfers Sit to Stand: Min guard Stand pivot transfers: Min guard       General transfer comment: Min guard for sit<>stand and stand<>pivot transfer for safety. Verbal cues for hand placement, relies on RW. Pt able to safely perform sit<>stand however had decreased safety with stand<>pivot due to lack of control of prosthetic leg and arm weakness when taking steps with RW during pivot.  Ambulation/Gait Ambulation/Gait assistance: Min guard Ambulation Distance (Feet): 6 Feet Assistive device: Rolling walker (2 wheeled) Gait Pattern/deviations:  ("hop-to" pattern)     General Gait Details: Pt able to hop with R prosthetic. Cues and demonstration given to educate on safe use of UEs on  RW to take a small hop forward. Unable to safely do this due to lack of prosthetic control (takes too large of a step).   Stairs            Wheelchair Mobility    Modified Rankin (Stroke Patients Only)       Balance                                    Cognition Arousal/Alertness: Awake/alert Behavior During Therapy: WFL for tasks assessed/performed Overall Cognitive Status: Within Functional Limits for tasks assessed                      Exercises General Exercises - Lower Extremity Long Arc Quad: AROM;Left;10 reps;Seated    General Comments General comments (skin integrity, edema, etc.): Pt wore boot and safely follows NWB precautions      Pertinent Vitals/Pain Pt denies any pain at this time Pt repositioned for comfort in w/c anticipating d/c    Home Living                      Prior Function            PT Goals (current goals can now be found in the care plan section) Acute Rehab PT Goals PT Goal Formulation: With patient Time For Goal Achievement: 06/25/13 Potential to Achieve Goals: Good Progress towards PT goals: Progressing toward goals    Frequency  Min 3X/week    PT Plan  Current plan remains appropriate    Co-evaluation             End of Session Equipment Utilized During Treatment: Gait belt;Other (comment) (Boot for protection LLE) Activity Tolerance: Patient tolerated treatment well Patient left: in chair;with call bell/phone within reach;with family/visitor present     Time: [id number] PT Time Calculation (min): 26 min  Charges:  $Therapeutic Activity: 8-22 mins $Self Care/Home Management: 2022/11/28                    G Codes:      Elayne Snare, [place]   Ellouise Newer 06/20/2013, 11:02 AM

## 2013-06-20 NOTE — Progress Notes (Signed)
Patient discharged to home accompanied by family. Discharge instructions and rx given and explained and patient stated understanding. IV was removed and patient left unit in a stable condition with all personal belongings via wheelchair.

## 2013-06-20 NOTE — Care Management Note (Signed)
CARE MANAGEMENT NOTE 06/20/2013  Patient:  [name], [name]   Account Number:  [id number]  Date Initiated:  06/18/2013  Documentation initiated by:  Ricki Miller  Subjective/Objective Assessment:   58 yr old female admitted with open left ankle wound.S/p I & D with wound vac application.     Action/Plan:   Case Manager placed [facility] authorization on chart. CM Will continue to monitor.   Anticipated DC Date:  06/20/2013   Anticipated DC Plan:  [place]  CM consult      Choice offered to / List presented to:             Status of service:  Completed, signed off Medicare Important Message given?   (If response is "NO", the following Medicare IM given date fields will be blank) Date Medicare IM given:   Date Additional Medicare IM given:    Discharge Disposition:  HOME/SELF CARE  Per UR Regulation:    If discussed at Long Length of Stay Meetings, dates discussed:    Comments:  06/20/13 9:53am Ricki Miller, RN BSN Case Manager Order was entered to assist patient with medications. Patient has medicare. She is not eligible for MATCH prograam.

## 2013-06-20 NOTE — Discharge Instructions (Signed)
Keep dressing clean dry and intact. Minimize weightbearing left lower extremity. Where the fracture boot at all times.

## 2013-06-20 NOTE — Discharge Summary (Signed)
Physician Discharge Summary  Patient ID: [name] MRN: [id number] DOB/AGE: [birth year] 57 y.o.  Admit date: 06/17/2013 Discharge date: 06/20/2013  Admission Diagnoses: Osteomyelitis left ankle status post Charcot fracture collapse open.  Discharge Diagnoses: Osteomyelitis left ankle status post Charcot fracture collapse open. Active Problems:   Osteomyelitis of ankle or foot, left, acute   Protein-calorie malnutrition, severe   Discharged Condition: stable  Hospital Course: Patient's hospital course was essentially unremarkable. She underwent excision of the infected bone placement of antibiotic beads placement of wound VAC. On examination today patient's incision is well approximated no cellulitis no drainage no signs of infection. Plan for discharge to home continue her oral doxycycline there is local antibiotics in place followed in the office this Friday.  Consults: None  Significant Diagnostic Studies: labs: Routine labs  Treatments: surgery: See operative note  Discharge Exam: Blood pressure 147/79, pulse 90, temperature 98.7 F (37.1 C), temperature source Oral, resp. rate 16, height 5\' 5"  (1.651 m), weight 78.472 kg (173 lb), SpO2 99.00%. Incision/Wound: incision clean dry and intact with no gaping of the incision.  Disposition: 01-Home or Self Care  Discharge Orders   Future Appointments Provider Department Dept Phone   05/11/2014 2:00 PM [facility] [place] [phone]   05/11/2014 2:30 PM [facility] [place] [phone]   05/11/2014 3:00 PM [facility] [place] [id number]   05/11/2014 4:00 PM Elam Dutch, MD [facility] [phone]   Future Orders Complete By Expires   Call MD / Call 911  As directed    Comments:     If you experience chest pain or shortness of breath, CALL 911 and be transported to the hospital emergency room.  If  you develope a fever above 101 F, pus (white drainage) or increased drainage or redness at the wound, or calf pain, call your surgeon's office.   Constipation Prevention  As directed    Comments:     Drink plenty of fluids.  Prune juice may be helpful.  You may use a stool softener, such as Colace (over the counter) 100 mg twice a day.  Use MiraLax (over the counter) for constipation as needed.   Diet - low sodium heart healthy  As directed    Increase activity slowly as tolerated  As directed        Medication List         amitriptyline 25 MG tablet  Commonly known as:  ELAVIL  Take 25 mg by mouth at bedtime as needed for sleep.     aspirin 81 MG tablet  Take 81 mg by mouth daily.     atorvastatin 80 MG tablet  Commonly known as:  LIPITOR  Take 80 mg by mouth daily.     clopidogrel 75 MG tablet  Commonly known as:  PLAVIX  Take 75 mg by mouth daily.     doxycycline 100 MG DR capsule  Commonly known as:  DORYX  Take 100 mg by mouth 2 (two) times daily.     insulin NPH Human 100 UNIT/ML injection  Commonly known as:  HUMULIN N,NOVOLIN N  Inject 25-35 Units into the skin 2 (two) times daily before a meal.     metFORMIN 500 MG tablet  Commonly known as:  GLUCOPHAGE  Take 500 mg by mouth 4 (four) times daily.     metoprolol tartrate 25 MG tablet  Commonly known as:  LOPRESSOR  Take 25 mg by mouth 2 (two) times daily.     multivitamins ther. w/minerals Tabs tablet  Take 1 tablet by mouth daily.     oxyCODONE-acetaminophen 5-325 MG per tablet  Commonly known as:  ROXICET  Take 1 tablet by mouth every 4 (four) hours as needed for severe pain.     oxyCODONE-acetaminophen 5-325 MG per tablet  Commonly known as:  ROXICET  Take 1 tablet by mouth every 4 (four) hours as needed for severe pain.     quinapril 40 MG tablet  Commonly known as:  ACCUPRIL  Take 40 mg by mouth daily.     ranitidine 150 MG tablet  Commonly known as:  ZANTAC  Take 150 mg by mouth 2 (two) times  daily.     sertraline 100 MG tablet  Commonly known as:  ZOLOFT  Take 100 mg by mouth 2 (two) times daily.     silver sulfADIAZINE 1 % cream  Commonly known as:  SILVADENE  Apply 1 application topically daily.           Follow-up Information   Follow up with Teanna Elem V, MD In 1 week.   Specialty:  Orthopedic Surgery   Contact information:   [place] [address] [phone]       Signed: Newt Minion 06/20/2013, 6:56 AM

## 2013-07-01 ENCOUNTER — Telehealth: Payer: Self-pay

## 2013-07-01 NOTE — Telephone Encounter (Signed)
Spoke with Ellyn to schedule, dpm

## 2013-07-01 NOTE — Telephone Encounter (Signed)
Phone call from pt.  Requesting appt. For a 2nd opinion with Dr. Oneida Alar.  Stated the wound vac that was placed following the I & D of left LE wound, had to be discontinued at discharge due to insurance not covering.  Has been followed by Dr. Sharol Given.  Reports "I saw him on 4/10 in follow-up, and my leg didn't look too good."  Has been instructed to wash the wound daily and apply Silvadine Cream.  Asking to see Dr. Oneida Alar for a 2nd opinion.  Stated "he worked with me when I had a wound before, and he helped me... I trust him."  Advised to continue the wound care as instructed by Dr. Sharol Given, until appt. with Dr. Oneida Alar; will contact pt. with an appt.

## 2013-07-13 ENCOUNTER — Other Ambulatory Visit: Payer: Self-pay | Admitting: *Deleted

## 2013-07-13 ENCOUNTER — Encounter: Payer: Self-pay | Admitting: Vascular Surgery

## 2013-07-13 DIAGNOSIS — Z48812 Encounter for surgical aftercare following surgery on the circulatory system: Secondary | ICD-10-CM

## 2013-07-13 DIAGNOSIS — I739 Peripheral vascular disease, unspecified: Secondary | ICD-10-CM

## 2013-07-13 DIAGNOSIS — L98499 Non-pressure chronic ulcer of skin of other sites with unspecified severity: Principal | ICD-10-CM

## 2013-07-14 ENCOUNTER — Ambulatory Visit (INDEPENDENT_AMBULATORY_CARE_PROVIDER_SITE_OTHER): Payer: Medicare Other | Admitting: Vascular Surgery

## 2013-07-14 ENCOUNTER — Ambulatory Visit (HOSPITAL_COMMUNITY)
Admission: RE | Admit: 2013-07-14 | Discharge: 2013-07-14 | Disposition: A | Discharge status: Home / Self Care | Payer: Medicare Other | Source: Ambulatory Visit | Attending: Vascular Surgery | Admitting: Vascular Surgery

## 2013-07-14 ENCOUNTER — Encounter: Payer: Self-pay | Admitting: Vascular Surgery

## 2013-07-14 ENCOUNTER — Other Ambulatory Visit: Payer: Self-pay

## 2013-07-14 VITALS — BP 123/97 | HR 91 | Ht 65.0 in | Wt 170.0 lb

## 2013-07-14 DIAGNOSIS — I7025 Atherosclerosis of native arteries of other extremities with ulceration: Secondary | ICD-10-CM

## 2013-07-14 DIAGNOSIS — L98499 Non-pressure chronic ulcer of skin of other sites with unspecified severity: Principal | ICD-10-CM

## 2013-07-14 DIAGNOSIS — I739 Peripheral vascular disease, unspecified: Secondary | ICD-10-CM

## 2013-07-14 DIAGNOSIS — Z48812 Encounter for surgical aftercare following surgery on the circulatory system: Secondary | ICD-10-CM

## 2013-07-14 HISTORY — DX: Atherosclerosis of native arteries of other extremities with ulceration: I70.25

## 2013-07-14 NOTE — Progress Notes (Signed)
Patient is a 58 year old female with peripheral arterial disease he returns for followup today. She had right carotid endarterectomy in 2009. She has had a previous right below-knee amputation. She has had a left superficial femoral artery stent placed in 2011. She has had amputation of multiple toes left foot. She denies any numbness or tingling in the left leg today. She denies any claudication. She fractured her ankle several months ago. She has had difficulty healing the wound. She recently had debridement and placement of antibiotic beads and then nonhealing wound of the left ankle by Dr. Sharol Given. She has had continued persistent drainage from the wound.     Past Medical History    Diagnosis   Date    .   Diabetes mellitus       .   Hypertension       .   Neuropathy       .   Peripheral vascular disease       .   Coronary artery disease         Review of systems: She denies shortness of breath. She denies chest pain.     Past Surgical History  Procedure Laterality Date  . Toe amputation Right     "took off a couple toes before the amputation"  . Below knee leg amputation Right Jan. 2008  . Carotid endarterectomy Right   . Cholecystectomy    . Ankle fusion Left 05/11/2013    TIBIOCALCANEAL FUSION   . Tonsillectomy    . Dilation and curettage of uterus    . Coronary artery bypass graft  12/01/2007    "CABG X4" (05/12/2013)  . Ankle fusion Left 05/12/2013    Procedure: LEFT TIBIOCALCANEAL FUSION;  Surgeon: Newt Minion, MD;  Location: [facility];  Service: Orthopedics;  Laterality: Left;  Left Tibiocalcaneal Fusion  . Colonoscopy    . Irrigation and debridement abscess Left 06/17/2013    ANKLE           DR [name]  . I&d extremity Left 06/17/2013    Procedure: IRRIGATION AND DEBRIDEMENT EXTREMITY, PLACEMENT ANTIBIOTIC STIMULAN BEADS;  Surgeon: Newt Minion, MD;  Location: [facility];  Service: Orthopedics;  Laterality: Left;  . Application of wound vac  06/17/2013    Procedure: APPLICATION OF WOUND  VAC;  Surgeon: Newt Minion, MD;  Location: [facility];  Service: Orthopedics;;      History       Social History    .   Marital Status:   Married          Spouse Name:   N/A          Number of Children:   N/A    .   Years of Education:   N/A       Social History Main Topics    .   Smoking status:   Never Smoker     .   Smokeless tobacco:   Never Used    .   Alcohol Use:   No    .   Drug Use:   No    .   Sexually Active:   None       Other Topics   Concern    .   None       Social History Narrative    .   None      Physical exam:     Filed Vitals:   07/14/13 1134  BP: 123/97  Pulse: 91  Height: 5\' 5"  (1.651 m)  Weight: 170 lb (77.111 kg)  SpO2: 96%   Neck: Bilateral carotid bruits  Chest: Clear to auscultation bilaterally  Cardiac: Regular rate and rhythm 3/6 systolic murmur  Extremities: 2+ femoral pulses bilaterally, absent left dorsalis pedis pulse, multiple amputations toes left foot, edema of her left lateral malleolus open 4 cm wound with serous drainage right medial malleolus several sutures surrounding this  Data: Patient had a duplex of her left lower extremity today which showed a patent left superficial femoral artery stent with monophasic waveforms in the proximal SFA and diffusely throughout the stent  Assessment: Patent left superficial femoral stent with some recurrent stenosis now of nonhealing wound left leg.  Patient is at very high risk for limb loss left leg with osteomyelitis at her ankle level. Since she does have evidence of some recurrent left superficial femoral artery stenosis I believe an arteriogram is warranted  Plan: Aortogram bilateral lower extremity runoff possible intervention next week To maximize perfusion to the left leg to hopefully improve chances of wound healing. However, she is at very high risk for requiring a left leg amputation.  Ruta Hinds, MD [facility] Office: [phone] Pager:  [phone]

## 2013-07-19 ENCOUNTER — Telehealth: Payer: Self-pay | Admitting: *Deleted

## 2013-07-19 NOTE — Telephone Encounter (Signed)
Patient called in today to cancel her aortogram that was scheduled for 07-22-13 with Dr. Oneida Alar. She states that she does not have any transportation and will call us back to reschedule asap. I put her scheduling information in Dr. Oneida Alar pending file.

## 2013-07-22 ENCOUNTER — Encounter (HOSPITAL_COMMUNITY): Payer: Self-pay

## 2013-07-22 ENCOUNTER — Ambulatory Visit (HOSPITAL_COMMUNITY): Admit: 2013-07-22 | Payer: Medicare Other | Admitting: Vascular Surgery

## 2013-07-22 SURGERY — ABDOMINAL AORTAGRAM
Anesthesia: LOCAL

## 2013-08-01 ENCOUNTER — Encounter: Payer: Self-pay | Admitting: Surgery

## 2013-08-15 ENCOUNTER — Encounter: Payer: Self-pay | Admitting: Surgery

## 2013-08-21 ENCOUNTER — Inpatient Hospital Stay (HOSPITAL_COMMUNITY)
Admission: EM | Admit: 2013-08-21 | Discharge: 2013-08-25 | DRG: 617 | Discharge status: Against Medical Advice | Payer: Medicare Other | Attending: Family Medicine | Admitting: Family Medicine

## 2013-08-21 ENCOUNTER — Encounter (HOSPITAL_COMMUNITY): Payer: Self-pay | Admitting: Emergency Medicine

## 2013-08-21 ENCOUNTER — Emergency Department (HOSPITAL_COMMUNITY): Payer: Medicare Other

## 2013-08-21 DIAGNOSIS — G988 Other disorders of nervous system: Secondary | ICD-10-CM | POA: Diagnosis present

## 2013-08-21 DIAGNOSIS — I25810 Atherosclerosis of coronary artery bypass graft(s) without angina pectoris: Secondary | ICD-10-CM | POA: Diagnosis present

## 2013-08-21 DIAGNOSIS — I739 Peripheral vascular disease, unspecified: Secondary | ICD-10-CM

## 2013-08-21 DIAGNOSIS — M86172 Other acute osteomyelitis, left ankle and foot: Secondary | ICD-10-CM

## 2013-08-21 DIAGNOSIS — G929 Unspecified toxic encephalopathy: Secondary | ICD-10-CM | POA: Diagnosis not present

## 2013-08-21 DIAGNOSIS — IMO0002 Reserved for concepts with insufficient information to code with codable children: Principal | ICD-10-CM | POA: Diagnosis present

## 2013-08-21 DIAGNOSIS — F3289 Other specified depressive episodes: Secondary | ICD-10-CM | POA: Diagnosis present

## 2013-08-21 DIAGNOSIS — E11610 Type 2 diabetes mellitus with diabetic neuropathic arthropathy: Secondary | ICD-10-CM | POA: Diagnosis present

## 2013-08-21 DIAGNOSIS — L02419 Cutaneous abscess of limb, unspecified: Secondary | ICD-10-CM | POA: Diagnosis present

## 2013-08-21 DIAGNOSIS — M869 Osteomyelitis, unspecified: Secondary | ICD-10-CM | POA: Diagnosis present

## 2013-08-21 DIAGNOSIS — E871 Hypo-osmolality and hyponatremia: Secondary | ICD-10-CM | POA: Diagnosis present

## 2013-08-21 DIAGNOSIS — I798 Other disorders of arteries, arterioles and capillaries in diseases classified elsewhere: Secondary | ICD-10-CM | POA: Diagnosis present

## 2013-08-21 DIAGNOSIS — I2510 Atherosclerotic heart disease of native coronary artery without angina pectoris: Secondary | ICD-10-CM | POA: Diagnosis present

## 2013-08-21 DIAGNOSIS — I10 Essential (primary) hypertension: Secondary | ICD-10-CM | POA: Diagnosis present

## 2013-08-21 DIAGNOSIS — E1149 Type 2 diabetes mellitus with other diabetic neurological complication: Secondary | ICD-10-CM | POA: Diagnosis present

## 2013-08-21 DIAGNOSIS — E1142 Type 2 diabetes mellitus with diabetic polyneuropathy: Secondary | ICD-10-CM | POA: Diagnosis present

## 2013-08-21 DIAGNOSIS — T368X5A Adverse effect of other systemic antibiotics, initial encounter: Secondary | ICD-10-CM | POA: Diagnosis not present

## 2013-08-21 DIAGNOSIS — Z951 Presence of aortocoronary bypass graft: Secondary | ICD-10-CM

## 2013-08-21 DIAGNOSIS — E1169 Type 2 diabetes mellitus with other specified complication: Principal | ICD-10-CM

## 2013-08-21 DIAGNOSIS — M9080 Osteopathy in diseases classified elsewhere, unspecified site: Secondary | ICD-10-CM | POA: Diagnosis present

## 2013-08-21 DIAGNOSIS — E1165 Type 2 diabetes mellitus with hyperglycemia: Secondary | ICD-10-CM | POA: Diagnosis present

## 2013-08-21 DIAGNOSIS — E118 Type 2 diabetes mellitus with unspecified complications: Secondary | ICD-10-CM

## 2013-08-21 DIAGNOSIS — K219 Gastro-esophageal reflux disease without esophagitis: Secondary | ICD-10-CM | POA: Diagnosis present

## 2013-08-21 DIAGNOSIS — F341 Dysthymic disorder: Secondary | ICD-10-CM | POA: Diagnosis present

## 2013-08-21 DIAGNOSIS — Z7982 Long term (current) use of aspirin: Secondary | ICD-10-CM

## 2013-08-21 DIAGNOSIS — Z7902 Long term (current) use of antithrombotics/antiplatelets: Secondary | ICD-10-CM

## 2013-08-21 DIAGNOSIS — S88119A Complete traumatic amputation at level between knee and ankle, unspecified lower leg, initial encounter: Secondary | ICD-10-CM

## 2013-08-21 DIAGNOSIS — Z9889 Other specified postprocedural states: Secondary | ICD-10-CM

## 2013-08-21 DIAGNOSIS — E119 Type 2 diabetes mellitus without complications: Secondary | ICD-10-CM

## 2013-08-21 DIAGNOSIS — D638 Anemia in other chronic diseases classified elsewhere: Secondary | ICD-10-CM | POA: Diagnosis present

## 2013-08-21 DIAGNOSIS — Z794 Long term (current) use of insulin: Secondary | ICD-10-CM | POA: Diagnosis present

## 2013-08-21 DIAGNOSIS — L03119 Cellulitis of unspecified part of limb: Secondary | ICD-10-CM

## 2013-08-21 DIAGNOSIS — E1159 Type 2 diabetes mellitus with other circulatory complications: Secondary | ICD-10-CM | POA: Diagnosis present

## 2013-08-21 DIAGNOSIS — D649 Anemia, unspecified: Secondary | ICD-10-CM

## 2013-08-21 DIAGNOSIS — E1141 Type 2 diabetes mellitus with diabetic mononeuropathy: Secondary | ICD-10-CM | POA: Diagnosis present

## 2013-08-21 DIAGNOSIS — D509 Iron deficiency anemia, unspecified: Secondary | ICD-10-CM | POA: Diagnosis present

## 2013-08-21 DIAGNOSIS — F411 Generalized anxiety disorder: Secondary | ICD-10-CM | POA: Diagnosis present

## 2013-08-21 DIAGNOSIS — Z79899 Other long term (current) drug therapy: Secondary | ICD-10-CM

## 2013-08-21 DIAGNOSIS — F329 Major depressive disorder, single episode, unspecified: Secondary | ICD-10-CM | POA: Diagnosis present

## 2013-08-21 DIAGNOSIS — N179 Acute kidney failure, unspecified: Secondary | ICD-10-CM | POA: Diagnosis not present

## 2013-08-21 DIAGNOSIS — G92 Toxic encephalopathy: Secondary | ICD-10-CM | POA: Diagnosis not present

## 2013-08-21 LAB — PREPARE RBC (CROSSMATCH)

## 2013-08-21 LAB — CBC WITH DIFFERENTIAL/PLATELET
Basophils Absolute: 0 10*3/uL (ref 0.0–0.1)
Basophils Relative: 0 % (ref 0–1)
EOS PCT: 0 % (ref 0–5)
Eosinophils Absolute: 0 10*3/uL (ref 0.0–0.7)
HCT: 20.8 % — ABNORMAL LOW (ref 36.0–46.0)
Hemoglobin: 6.3 g/dL — CL (ref 12.0–15.0)
LYMPHS PCT: 9 % — AB (ref 12–46)
Lymphs Abs: 1 10*3/uL (ref 0.7–4.0)
MCH: 23.6 pg — ABNORMAL LOW (ref 26.0–34.0)
MCHC: 30.3 g/dL (ref 30.0–36.0)
MCV: 77.9 fL — AB (ref 78.0–100.0)
Monocytes Absolute: 1.1 10*3/uL — ABNORMAL HIGH (ref 0.1–1.0)
Monocytes Relative: 10 % (ref 3–12)
Neutro Abs: 9.2 10*3/uL — ABNORMAL HIGH (ref 1.7–7.7)
Neutrophils Relative %: 81 % — ABNORMAL HIGH (ref 43–77)
PLATELETS: 366 10*3/uL (ref 150–400)
RBC: 2.67 MIL/uL — ABNORMAL LOW (ref 3.87–5.11)
RDW: 20.1 % — ABNORMAL HIGH (ref 11.5–15.5)
WBC: 11.3 10*3/uL — ABNORMAL HIGH (ref 4.0–10.5)

## 2013-08-21 LAB — BASIC METABOLIC PANEL
BUN: 10 mg/dL (ref 6–23)
CHLORIDE: 92 meq/L — AB (ref 96–112)
CO2: 22 mEq/L (ref 19–32)
CREATININE: 0.89 mg/dL (ref 0.50–1.10)
Calcium: 8.7 mg/dL (ref 8.4–10.5)
GFR calc non Af Amer: 71 mL/min — ABNORMAL LOW (ref >90)
GFR, EST AFRICAN AMERICAN: 82 mL/min — AB (ref >90)
Glucose, Bld: 252 mg/dL — ABNORMAL HIGH (ref 70–99)
POTASSIUM: 4.3 meq/L (ref 3.7–5.3)
SODIUM: 129 meq/L — AB (ref 137–147)

## 2013-08-21 LAB — I-STAT CHEM 8, ED
BUN: 8 mg/dL (ref 6–23)
Calcium, Ion: 1.18 mmol/L (ref 1.12–1.23)
Chloride: 91 mEq/L — ABNORMAL LOW (ref 96–112)
Creatinine, Ser: 0.9 mg/dL (ref 0.50–1.10)
Glucose, Bld: 360 mg/dL — ABNORMAL HIGH (ref 70–99)
HCT: 22 % — ABNORMAL LOW (ref 36.0–46.0)
HEMOGLOBIN: 7.5 g/dL — AB (ref 12.0–15.0)
POTASSIUM: 4.2 meq/L (ref 3.7–5.3)
SODIUM: 130 meq/L — AB (ref 137–147)
TCO2: 24 mmol/L (ref 0–100)

## 2013-08-21 LAB — RETICULOCYTES
RBC.: 2.67 MIL/uL — ABNORMAL LOW (ref 3.87–5.11)
Retic Count, Absolute: 90.8 10*3/uL (ref 19.0–186.0)
Retic Ct Pct: 3.4 % — ABNORMAL HIGH (ref 0.4–3.1)

## 2013-08-21 LAB — PROTIME-INR
INR: 1.28 (ref 0.00–1.49)
Prothrombin Time: 15.7 seconds — ABNORMAL HIGH (ref 11.6–15.2)

## 2013-08-21 MED ORDER — VANCOMYCIN HCL IN DEXTROSE 1-5 GM/200ML-% IV SOLN
1000.0000 mg | Freq: Two times a day (BID) | INTRAVENOUS | Status: DC
  Administered 2013-08-21 – 2013-08-24 (×7): 1000 mg via INTRAVENOUS
  Filled 2013-08-21 (×9): qty 200

## 2013-08-21 MED ORDER — PIPERACILLIN-TAZOBACTAM 4.5 G IVPB
4.5000 g | Freq: Once | INTRAVENOUS | Status: AC
  Administered 2013-08-22: 4.5 g via INTRAVENOUS
  Filled 2013-08-21: qty 100

## 2013-08-21 MED ORDER — ACETAMINOPHEN 325 MG PO TABS
650.0000 mg | ORAL_TABLET | Freq: Four times a day (QID) | ORAL | Status: DC | PRN
  Administered 2013-08-22 – 2013-08-24 (×3): 650 mg via ORAL
  Filled 2013-08-21 (×3): qty 2

## 2013-08-21 NOTE — ED Notes (Signed)
Dr. Posey Pronto at bedside and made aware of patients temp of 103 and temp rechecked with Dr. Posey Pronto at bedside and temp 101.7. Dr. Posey Pronto to order tylenol and verbally acknowledges that the pt has a fever and Dr. Posey Pronto stated it is still okay to give the blood transfusion. Pts family at bedside.

## 2013-08-21 NOTE — ED Notes (Signed)
Reports hgb low x 2 weeks.  Reports increased weakness x 2 days and family concerned that she is pale.

## 2013-08-21 NOTE — ED Notes (Signed)
Edison Simon brought in for th pts comfort.  Her med has infused.   The incision line of her lt foot is red  Swollen and draining purulent appearing drainage.  Dry bandage  Placed to cover it.  Rt bk   Artificial limp removed by the pt and her family for comfort.

## 2013-08-21 NOTE — H&P (Signed)
[facility] History and Physical  Patient: [name]  [id number]  DOB: Dec 20, 1955  DOS: the patient was seen and examined on [id number] PCP: [name], MD  Chief Complaint:  pus from surgery site   HPI: [name] is a 58 y.o. female with Past medical history of  hypertension, diabetes mellitus, but if there was disease, coronary artery disease status post CABG, osteomyelitis of the left leg with sharp with food status post amputation and followed by debridement. Angioplasty. Right BKA. .  patient presented with complaints of discharge coming out from her left foot incision. She was in an accident and had significant injury in February of her left foot. She had infection of the left foot and has undergone wound debridement in April. She was on antibiotic at that time. She mentions that she has been following with vascular surgery and orthopedics and then was referred to wound care clinic. She was blind for a hyperbaric chamber treatment next week. She mentions that since last Tuesday she has been having this in the difficult to control pain with home pain medication and started noticing increasing discharge as well as a swelling of her left foot. She started having fever and confusion as well. And therefore they brought her to the hospital.  [name] coming from home. And at her baseline independent for most of her ADL.  Review of Systems: as mentioned in the history of present illness.  A Comprehensive review of the other systems is negative.  Past Medical History  Diagnosis Date  . Hypertension   . Neuropathy   . Peripheral vascular disease   . Coronary artery disease   . Anxiety   . Depression   . Anemia   . Type II diabetes mellitus   . High cholesterol   . History of blood transfusion     "one time; related to diabetes I guess"   . Arthritis     "hands"  . GERD (gastroesophageal reflux disease)     takes Zantac  . Complication of anesthesia      pt felt like she had a hard time waking up after surgery in Feb. 2015.   Past Surgical History  Procedure Laterality Date  . Toe amputation Right     "took off a couple toes before the amputation"  . Below knee leg amputation Right Jan. 2008  . Carotid endarterectomy Right   . Cholecystectomy    . Ankle fusion Left 05/11/2013    TIBIOCALCANEAL FUSION   . Tonsillectomy    . Dilation and curettage of uterus    . Coronary artery bypass graft  12/01/2007    "CABG X4" (05/12/2013)  . Ankle fusion Left 05/12/2013    Procedure: LEFT TIBIOCALCANEAL FUSION;  Surgeon: Newt Minion, MD;  Location: [place];  Service: Orthopedics;  Laterality: Left;  Left Tibiocalcaneal Fusion  . Colonoscopy    . Irrigation and debridement abscess Left 06/17/2013    ANKLE           DR [name]  . I&d extremity Left 06/17/2013    Procedure: IRRIGATION AND DEBRIDEMENT EXTREMITY, PLACEMENT ANTIBIOTIC STIMULAN BEADS;  Surgeon: Newt Minion, MD;  Location: [place];  Service: Orthopedics;  Laterality: Left;  . Application of wound vac  06/17/2013    Procedure: APPLICATION OF WOUND VAC;  Surgeon: Newt Minion, MD;  Location: [place];  Service: Orthopedics;;   Social History:  reports that she has never smoked. She has never used smokeless tobacco. She  reports that she does not drink alcohol or use illicit drugs.  Allergies  Allergen Reactions  . Tape Rash    Redness, rash, itchiness    Family History  Problem Relation Age of Onset  . Cancer Mother   . Heart disease Father   . Diabetes Father   . Hyperlipidemia Father   . Hypertension Father     Prior to Admission medications   Medication Sig Start Date End Date Taking? Authorizing Provider  amitriptyline (ELAVIL) 25 MG tablet Take 25 mg by mouth at bedtime as needed for sleep.    Yes Historical Provider, MD  aspirin 81 MG tablet Take 81 mg by mouth daily.     Yes Historical Provider, MD  atorvastatin (LIPITOR) 80 MG tablet Take 80 mg by mouth daily.     Yes Historical  Provider, MD  clopidogrel (PLAVIX) 75 MG tablet Take 75 mg by mouth daily.    Yes Historical Provider, MD  insulin NPH Human (HUMULIN N,NOVOLIN N) 100 UNIT/ML injection Inject 25-35 Units into the skin 2 (two) times daily before a meal. 35 units in the morning and 25 units in the evening   Yes Historical Provider, MD  metFORMIN (GLUCOPHAGE) 500 MG tablet Take 500 mg by mouth 4 (four) times daily.     Yes Historical Provider, MD  metoprolol tartrate (LOPRESSOR) 25 MG tablet Take 25 mg by mouth 2 (two) times daily.    Yes Historical Provider, MD  Multiple Vitamins-Minerals (MULTIVITAMINS THER. W/MINERALS) TABS Take 1 tablet by mouth daily.     Yes Historical Provider, MD  oxyCODONE-acetaminophen (ROXICET) 5-325 MG per tablet Take 1 tablet by mouth every 4 (four) hours as needed for severe pain. 05/13/13  Yes Newt Minion, MD  quinapril (ACCUPRIL) 40 MG tablet Take 40 mg by mouth daily.    Yes Historical Provider, MD  ranitidine (ZANTAC) 150 MG tablet Take 150 mg by mouth 2 (two) times daily.    Yes Historical Provider, MD  sertraline (ZOLOFT) 100 MG tablet Take 100 mg by mouth 2 (two) times daily.    Yes Historical Provider, MD  silver sulfADIAZINE (SILVADENE) 1 % cream Apply 1 application topically daily.   Yes Historical Provider, MD    Physical Exam: Filed Vitals:   08/21/13 1653 08/21/13 1854 08/21/13 2056 08/21/13 2216  BP: 159/67 151/75 165/113   Pulse: 115  124   Temp: 99.2 F (37.3 C) 100.4 F (38 C)  103 F (39.4 C)  TempSrc: Oral Oral    Resp: 18 22 18 20   Height: 5\' 5"  (1.651 m)     Weight: 77.111 kg (170 lb)     SpO2: 97% 97% 100%     General: Alert, Awake and Oriented to Time, Place and Person. Appear in mild distress Eyes: PERRL ENT: Oral Mucosa clear moist. Neck: no JVD Cardiovascular: S1 and S2 Present, no Murmur, Peripheral Pulses Present Respiratory: Bilateral Air entry equal and Decreased, Clear to Auscultation,  no Crackles,no wheezes Abdomen: Bowel Sound  Present, Soft and Non tender Skin: no Rash Extremities: Right leg BKA  Left leg surgical sutures are present with draining pus  Neurologic: Grossly no focal neuro deficit. Labs on Admission:  CBC:  Recent Labs Lab 08/21/13 1724 08/21/13 2027  WBC  --  11.3*  NEUTROABS  --  9.2*  HGB 7.5* 6.3*  HCT 22.0* 20.8*  MCV  --  77.9*  PLT  --  366    CMP     Component Value Date/Time  NA 129* 08/21/2013 2027   K 4.3 08/21/2013 2027   CL 92* 08/21/2013 2027   CO2 22 08/21/2013 2027   GLUCOSE 252* 08/21/2013 2027   BUN 10 08/21/2013 2027   CREATININE 0.89 08/21/2013 2027   CALCIUM 8.7 08/21/2013 2027   PROT 6.9 06/17/2013 1130   ALBUMIN 2.3* 06/17/2013 1130   AST 12 06/17/2013 1130   ALT 11 06/17/2013 1130   ALKPHOS 165* 06/17/2013 1130   BILITOT 0.3 06/17/2013 1130   GFRNONAA 71* 08/21/2013 2027   GFRAA 82* 08/21/2013 2027    No results found for this basename: LIPASE, AMYLASE,  in the last 168 hours No results found for this basename: AMMONIA,  in the last 168 hours  No results found for this basename: CKTOTAL, CKMB, CKMBINDEX, TROPONINI,  in the last 168 hours BNP (last 3 results) No results found for this basename: PROBNP,  in the last 8760 hours  Radiological Exams on Admission: Dg Tibia/fibula Left  08/21/2013   CLINICAL DATA:  Swelling and open sores in the distal lower leg. Rule out osteomyelitis.  EXAM: LEFT TIBIA AND FIBULA - 2 VIEW  COMPARISON:  05/06/2013  FINDINGS: Since the previous study, there has been interval placement of intra medullary rod and screw fixation of the distal tibia through to the calcaneus. There is prominent productive bone around the tibia and tibia fibular joint with bone nonunion mildly displaced distal fibular fracture. There has been significant bone bruise or shown in the talus and upper calcaneus. Loss of distinction of cortical surfaces at the margin of the eroded bone. Diffuse soft tissue swelling. Findings likely to represent osteomyelitis.  IMPRESSION:  Postoperative changes with intra medullary rod and screw fixation from the lower tibia through to the calcaneus. Prominent bone erosion of the distal tibia, talus, and upper calcaneus with productive bone in the lower tibia fibular region. Changes likely represent osteomyelitis.   Electronically Signed   By: Lucienne Capers M.D.   On: 08/21/2013 22:12   Dg Foot 2 Views Left  08/21/2013   CLINICAL DATA:  Swelling and open sores in the distal lower leg to rule out osteomyelitis.  EXAM: LEFT FOOT - 2 VIEW  COMPARISON:  Left foot 05/13/2009  FINDINGS: Since the previous study, there has been resection or resorption of the left second, third, and fourth toes at the metatarsal phalangeal region. There is bone resection or resorption involving the distal aspect of the remaining second and fourth metatarsal bones. Diffuse soft tissue swelling. Changes are nonspecific without interval comparison studies and could represent postoperative change or changes due to osteomyelitis. Diffuse bone demineralization. Cortical sclerosis along the mid shafts of the metatarsal bones. Prominent hallux valgus deformity with prominent degenerative changes in the first metatarsal phalangeal joint.  IMPRESSION: Resection or resorption of the left second, third, and fourth toes with postoperative or erosive changes involving the metatarsal heads of second and fourth rays. Changes could represent postoperative change or osteomyelitis.   Electronically Signed   By: Lucienne Capers M.D.   On: 08/21/2013 22:14     Assessment/Plan Principal Problem:   Osteomyelitis Active Problems:   DIABETES MELLITUS   ANXIETY DEPRESSION   CAD, ARTERY BYPASS GRAFT   CAROTID ENDARTERECTOMY, RIGHT, HX OF   PVD (peripheral vascular disease)   Charcot foot due to diabetes mellitus   1. Osteomyelitis  patient is presenting with draining pus from the wound. X-ray is concerning for osteomyelitis it orthopedic has already been consulted for the  patient. Patient will be  admitted to the hospital and will be treated with broad-spectrum antibiotic IV vancomycin and IV Zosyn. Follow cultures. IV hydration. Patient will be kept n.p.o. for possible procedure in the morning.  2. diabetes mellitus Sliding scale. Continue home insulin. Hold metformin.  3. peripheral vascular disease Coronary artery disease Continue aspirin and Plavix there Will discuss with offloading and Plavix but  4.Hypertension Hold quinapril continue metoprolol.  5. Anemia Patient denies any active ongoing bleeding no trauma no injury. At present without and Hemoccult fecal. He may work up shows possibility of anemia of chronic disease more likely. Patient is receiving 2 units of PRBC. Recheck the counts. May require further transfusion.  Consults: Orthopedics   DVT Prophylaxis: SCD Nutrition:  n.p.o.   Code Status:  full   Family Communication:  family  was present at bedside, opportunity was given to ask question and all questions were answered satisfactorily at the time of interview. Disposition: Admitted to inpatient in med-surge unit.  Author: Berle Mull, MD [facility] Pager: [phone] 08/21/2013, 11:41 PM    If 7PM-7AM, please contact night-coverage [email or web address] Password [email or web address]

## 2013-08-21 NOTE — ED Notes (Signed)
The pts temp has increased  She just returned from xray

## 2013-08-21 NOTE — ED Notes (Signed)
The pt was seen in  Her doctors office for  A check and she was found to have a low hgb.  Her her hgb is i gm higher.  She is c/o lt foot pain from a fracture  Her bandage has drainage on it. She reports is normal.  Alert skin warm and dry  Color good.  Hx of lower hgb in te past .  Her stools are not dark no abd pain ect

## 2013-08-21 NOTE — ED Provider Notes (Signed)
CSN: [id number]     Arrival date & time 08/21/13  1643 History   First MD Initiated Contact with Patient 08/21/13 1848     Chief Complaint  Patient presents with  . Anemia      HPI  Patient's primary care is Dr.Garlick.  She has been seeing a wound care physician in [place]. She sees Dr.[name], of [place]. Had debriedment of open wound in 06/2013 for wound infection.  Original surgery was fusion after Charcot foot. History of diabetes, peripheral vascular disease, peripheral neuropathy, anemia. Status post transfusion at least one point in the past for symptomatic anemia.  As today with weakness. Complains that she's been weak over last cycle days. Family states she looked pale today. A low hemoglobin her primary care physician's office. Presents here with elevated temperature 102.3. She has drainage from the wound in her left lower leg. Has seen orthopedics as above. She states that she's had a drainage for "a while". She states she was afraid to see her orthopedic surgeon because "I am afraid I am going to lose my leg".  No GI bleeding or dark stools. No hematemesis. Patient also been anemic for a long time". She does not know why.  Past Medical History  Diagnosis Date  . Hypertension   . Neuropathy   . Peripheral vascular disease   . Coronary artery disease   . Anxiety   . Depression   . Anemia   . Type II diabetes mellitus   . High cholesterol   . History of blood transfusion     "one time; related to diabetes I guess"   . Arthritis     "hands"  . GERD (gastroesophageal reflux disease)     takes Zantac  . Complication of anesthesia     pt felt like she had a hard time waking up after surgery in Feb. 2015.   Past Surgical History  Procedure Laterality Date  . Toe amputation Right     "took off a couple toes before the amputation"  . Below knee leg amputation Right Jan. 2008  . Carotid endarterectomy Right   . Cholecystectomy    . Ankle fusion Left 05/11/2013    TIBIOCALCANEAL FUSION   . Tonsillectomy    . Dilation and curettage of uterus    . Coronary artery bypass graft  12/01/2007    "CABG X4" (05/12/2013)  . Ankle fusion Left 05/12/2013    Procedure: LEFT TIBIOCALCANEAL FUSION;  Surgeon: Newt Minion, MD;  Location: [facility];  Service: Orthopedics;  Laterality: Left;  Left Tibiocalcaneal Fusion  . Colonoscopy    . Irrigation and debridement abscess Left 06/17/2013    ANKLE           DR [name]  . I&d extremity Left 06/17/2013    Procedure: IRRIGATION AND DEBRIDEMENT EXTREMITY, PLACEMENT ANTIBIOTIC STIMULAN BEADS;  Surgeon: Newt Minion, MD;  Location: [place];  Service: Orthopedics;  Laterality: Left;  . Application of wound vac  06/17/2013    Procedure: APPLICATION OF WOUND VAC;  Surgeon: Newt Minion, MD;  Location: [facility];  Service: Orthopedics;;   Family History  Problem Relation Age of Onset  . Cancer Mother   . Heart disease Father   . Diabetes Father   . Hyperlipidemia Father   . Hypertension Father    History  Substance Use Topics  . Smoking status: Never Smoker   . Smokeless tobacco: Never Used  . Alcohol Use: No   OB History  Grav Para Term Preterm Abortions TAB SAB Ect Mult Living                 Review of Systems  Constitutional: Positive for fever and fatigue. Negative for chills, diaphoresis and appetite change.  HENT: Negative for mouth sores, sore throat and trouble swallowing.   Eyes: Negative for visual disturbance.  Respiratory: Negative for cough, chest tightness, shortness of breath and wheezing.   Cardiovascular: Negative for chest pain.  Gastrointestinal: Negative for nausea, vomiting, abdominal pain, diarrhea and abdominal distention.  Endocrine: Negative for polydipsia, polyphagia and polyuria.  Genitourinary: Negative for dysuria, frequency and hematuria.  Musculoskeletal: Negative for gait problem.  Skin: Positive for wound. Negative for color change, pallor and rash.       Wound drainage left leg    Neurological: Positive for dizziness and weakness. Negative for syncope, light-headedness and headaches.  Hematological: Does not bruise/bleed easily.  Psychiatric/Behavioral: Negative for behavioral problems and confusion.      Allergies  Tape  Home Medications   Prior to Admission medications   Medication Sig Start Date End Date Taking? Authorizing Provider  amitriptyline (ELAVIL) 25 MG tablet Take 25 mg by mouth at bedtime as needed for sleep.    Yes Historical Provider, MD  aspirin 81 MG tablet Take 81 mg by mouth daily.     Yes Historical Provider, MD  atorvastatin (LIPITOR) 80 MG tablet Take 80 mg by mouth daily.     Yes Historical Provider, MD  clopidogrel (PLAVIX) 75 MG tablet Take 75 mg by mouth daily.    Yes Historical Provider, MD  insulin NPH Human (HUMULIN N,NOVOLIN N) 100 UNIT/ML injection Inject 25-35 Units into the skin 2 (two) times daily before a meal. 35 units in the morning and 25 units in the evening   Yes Historical Provider, MD  metFORMIN (GLUCOPHAGE) 500 MG tablet Take 500 mg by mouth 4 (four) times daily.     Yes Historical Provider, MD  metoprolol tartrate (LOPRESSOR) 25 MG tablet Take 25 mg by mouth 2 (two) times daily.    Yes Historical Provider, MD  Multiple Vitamins-Minerals (MULTIVITAMINS THER. W/MINERALS) TABS Take 1 tablet by mouth daily.     Yes Historical Provider, MD  oxyCODONE-acetaminophen (ROXICET) 5-325 MG per tablet Take 1 tablet by mouth every 4 (four) hours as needed for severe pain. 05/13/13  Yes Newt Minion, MD  quinapril (ACCUPRIL) 40 MG tablet Take 40 mg by mouth daily.    Yes Historical Provider, MD  ranitidine (ZANTAC) 150 MG tablet Take 150 mg by mouth 2 (two) times daily.    Yes Historical Provider, MD  sertraline (ZOLOFT) 100 MG tablet Take 100 mg by mouth 2 (two) times daily.    Yes Historical Provider, MD  silver sulfADIAZINE (SILVADENE) 1 % cream Apply 1 application topically daily.   Yes Historical Provider, MD   BP 165/113   Pulse 124  Temp(Src) 103 F (39.4 C) (Oral)  Resp 20  Ht 5\' 5"  (1.651 m)  Wt 170 lb (77.111 kg)  BMI 28.29 kg/m2  SpO2 100% Physical Exam  Constitutional: She is oriented to person, place, and time. She appears well-developed and well-nourished. No distress.  HENT:  Head: Normocephalic.  Eyes: Conjunctivae are normal. Pupils are equal, round, and reactive to light. No scleral icterus.  Conjunctiva appear pale  Neck: Normal range of motion. Neck supple. No thyromegaly present.  Cardiovascular: Regular rhythm.  Tachycardia present.  Exam reveals no gallop and no friction rub.  No murmur heard. Resting sinus tachycardia 109.  Pulmonary/Chest: Effort normal and breath sounds normal. No respiratory distress. She has no wheezes. She has no rales.  Abdominal: Soft. Bowel sounds are normal. She exhibits no distension. There is no tenderness. There is no rebound.  Musculoskeletal: Normal range of motion.       Legs: Neurological: She is alert and oriented to person, place, and time.  Skin: Skin is warm and dry. No rash noted.  Psychiatric: She has a normal mood and affect. Her behavior is normal.    ED Course  Procedures (including critical care time) Labs Review Labs Reviewed  BASIC METABOLIC PANEL - Abnormal; Notable for the following:    Sodium 129 (*)    Chloride 92 (*)    Glucose, Bld 252 (*)    GFR calc non Af Amer 71 (*)    GFR calc Af Amer 82 (*)    All other components within normal limits  CBC WITH DIFFERENTIAL - Abnormal; Notable for the following:    WBC 11.3 (*)    RBC 2.67 (*)    Hemoglobin 6.3 (*)    HCT 20.8 (*)    MCV 77.9 (*)    MCH 23.6 (*)    RDW 20.1 (*)    Neutrophils Relative % 81 (*)    Neutro Abs 9.2 (*)    Lymphocytes Relative 9 (*)    Monocytes Absolute 1.1 (*)    All other components within normal limits  PROTIME-INR - Abnormal; Notable for the following:    Prothrombin Time 15.7 (*)    All other components within normal limits  RETICULOCYTES  - Abnormal; Notable for the following:    Retic Ct Pct 3.4 (*)    RBC. 2.67 (*)    All other components within normal limits  I-STAT CHEM 8, ED - Abnormal; Notable for the following:    Sodium 130 (*)    Chloride 91 (*)    Glucose, Bld 360 (*)    Hemoglobin 7.5 (*)    HCT 22.0 (*)    All other components within normal limits  CULTURE, BLOOD (ROUTINE X 2)  CULTURE, BLOOD (ROUTINE X 2)  FERRITIN  VITAMIN B12  FOLATE  TYPE AND SCREEN  PREPARE RBC (CROSSMATCH)    Imaging Review Dg Tibia/fibula Left  08/21/2013   CLINICAL DATA:  Swelling and open sores in the distal lower leg. Rule out osteomyelitis.  EXAM: LEFT TIBIA AND FIBULA - 2 VIEW  COMPARISON:  05/06/2013  FINDINGS: Since the previous study, there has been interval placement of intra medullary rod and screw fixation of the distal tibia through to the calcaneus. There is prominent productive bone around the tibia and tibia fibular joint with bone nonunion mildly displaced distal fibular fracture. There has been significant bone bruise or shown in the talus and upper calcaneus. Loss of distinction of cortical surfaces at the margin of the eroded bone. Diffuse soft tissue swelling. Findings likely to represent osteomyelitis.  IMPRESSION: Postoperative changes with intra medullary rod and screw fixation from the lower tibia through to the calcaneus. Prominent bone erosion of the distal tibia, talus, and upper calcaneus with productive bone in the lower tibia fibular region. Changes likely represent osteomyelitis.   Electronically Signed   By: Lucienne Capers M.D.   On: 08/21/2013 22:12   Dg Foot 2 Views Left  08/21/2013   CLINICAL DATA:  Swelling and open sores in the distal lower leg to rule out osteomyelitis.  EXAM: LEFT FOOT -  2 VIEW  COMPARISON:  Left foot 05/13/2009  FINDINGS: Since the previous study, there has been resection or resorption of the left second, third, and fourth toes at the metatarsal phalangeal region. There is bone  resection or resorption involving the distal aspect of the remaining second and fourth metatarsal bones. Diffuse soft tissue swelling. Changes are nonspecific without interval comparison studies and could represent postoperative change or changes due to osteomyelitis. Diffuse bone demineralization. Cortical sclerosis along the mid shafts of the metatarsal bones. Prominent hallux valgus deformity with prominent degenerative changes in the first metatarsal phalangeal joint.  IMPRESSION: Resection or resorption of the left second, third, and fourth toes with postoperative or erosive changes involving the metatarsal heads of second and fourth rays. Changes could represent postoperative change or osteomyelitis.   Electronically Signed   By: Lucienne Capers M.D.   On: 08/21/2013 22:14     EKG Interpretation None      MDM   Final diagnoses:  Osteomyelitis  Diabetes  Symptomatic anemia    Hyponatremia at 129.Marland Kitchen Hemoglobin 6.3. The beats 11.3. INR 15.7.  Renal function preserved at 0.89.   X-rays show bony destruction osteolytic changes in cortical disruption all consistent with possible/probable osteomyelitis. I discussed the case with Dr. Lorin Mercy on-call for the patient's cardiac surgeon Dr. Sharol Given. They will see the patient. Please call hospitalist regarding admission. I did obtain blood cultures x2. I requested IV vancomycin which is infusing. Requested blood transfusion. Obtained consent from patient for this. Tanna Furry, MD 08/21/13 2325

## 2013-08-21 NOTE — ED Notes (Signed)
Surgery on her lt foot and ankle fracture feb problems since then

## 2013-08-21 NOTE — Progress Notes (Addendum)
ANTIBIOTIC CONSULT NOTE - INITIAL  Pharmacy Consult for vancomycin Indication: osteomyelitis  Allergies  Allergen Reactions  . Tape Rash    Redness, rash, itchiness    Patient Measurements: Height: 5\' 5"  (165.1 cm) Weight: 170 lb (77.111 kg) IBW/kg (Calculated) : 57   Vital Signs: Temp: 100.4 F (38 C) (06/07 1854) Temp src: Oral (06/07 1854) BP: 151/75 mmHg (06/07 1854) Pulse Rate: 115 (06/07 1653) Intake/Output from previous day:   Intake/Output from this shift:    Labs:  Recent Labs  08/21/13 1724  HGB 7.5*  CREATININE 0.90   Estimated Creatinine Clearance: 71.6 ml/min (by C-G formula based on Cr of 0.9). No results found for this basename: VANCOTROUGH, VANCOPEAK, VANCORANDOM, GENTTROUGH, GENTPEAK, GENTRANDOM, TOBRATROUGH, TOBRAPEAK, TOBRARND, AMIKACINPEAK, AMIKACINTROU, AMIKACIN,  in the last 72 hours   Microbiology: No results found for this or any previous visit (from the past 720 hour(s)).  Medical History: Past Medical History  Diagnosis Date  . Hypertension   . Neuropathy   . Peripheral vascular disease   . Coronary artery disease   . Anxiety   . Depression   . Anemia   . Type II diabetes mellitus   . High cholesterol   . History of blood transfusion     "one time; related to diabetes I guess"   . Arthritis     "hands"  . GERD (gastroesophageal reflux disease)     takes Zantac  . Complication of anesthesia     pt felt like she had a hard time waking up after surgery in Feb. 2015.   Assessment: 43 YOF with left foot fracture who has been on vancomycin for osteomyelitis during her last admission in April. Difficult to tell by discharge notes and outpatient office notes if she was taking this at home. Tmax in ED 100.4. SCr 0.9, est CrCl ~19mL/min. Blood cultures have been sent.  Goal of Therapy:  Vancomycin trough level 15-20 mcg/ml  Plan:  1. Vancomycin 1000mg  IV q12h- f/u need to retime 2. Follow renal function, clinical progression,  imaging, trough at [facility], need for additional therapy, c/s  Lauren D. Bajbus, PharmD, BCPS Clinical Pharmacist Pager: [phone] 08/21/2013 8:22 PM   Add zosyn Zosyn 4.5 gm IV x 1 dose given in ED then zosyn 3.375 gm IV q8hs, infuse each dose over 4 hours  Eudelia Bunch, Pharm.D. BP:[phone] 08/22/2013 12:23 AM

## 2013-08-21 NOTE — ED Notes (Signed)
Dr. Patel at bedside 

## 2013-08-22 ENCOUNTER — Encounter (HOSPITAL_COMMUNITY)
Admission: EM | Discharge status: Against Medical Advice | Payer: Self-pay | Source: Home / Self Care | Attending: Internal Medicine

## 2013-08-22 ENCOUNTER — Encounter (HOSPITAL_COMMUNITY): Payer: Self-pay | Admitting: Certified Registered Nurse Anesthetist

## 2013-08-22 ENCOUNTER — Encounter (HOSPITAL_COMMUNITY): Payer: Medicare Other | Admitting: Certified Registered Nurse Anesthetist

## 2013-08-22 ENCOUNTER — Inpatient Hospital Stay (HOSPITAL_COMMUNITY): Payer: Medicare Other | Admitting: Certified Registered Nurse Anesthetist

## 2013-08-22 DIAGNOSIS — I10 Essential (primary) hypertension: Secondary | ICD-10-CM

## 2013-08-22 DIAGNOSIS — E119 Type 2 diabetes mellitus without complications: Secondary | ICD-10-CM

## 2013-08-22 DIAGNOSIS — E1149 Type 2 diabetes mellitus with other diabetic neurological complication: Secondary | ICD-10-CM

## 2013-08-22 DIAGNOSIS — D638 Anemia in other chronic diseases classified elsewhere: Secondary | ICD-10-CM

## 2013-08-22 HISTORY — PX: AMPUTATION: SHX166

## 2013-08-22 LAB — COMPREHENSIVE METABOLIC PANEL
ALT: 99 U/L — ABNORMAL HIGH (ref 0–35)
AST: 28 U/L (ref 0–37)
Albumin: 2.3 g/dL — ABNORMAL LOW (ref 3.5–5.2)
Alkaline Phosphatase: 186 U/L — ABNORMAL HIGH (ref 39–117)
BILIRUBIN TOTAL: 1.2 mg/dL (ref 0.3–1.2)
BUN: 9 mg/dL (ref 6–23)
CHLORIDE: 96 meq/L (ref 96–112)
CO2: 23 meq/L (ref 19–32)
CREATININE: 0.87 mg/dL (ref 0.50–1.10)
Calcium: 8.9 mg/dL (ref 8.4–10.5)
GFR calc Af Amer: 85 mL/min — ABNORMAL LOW (ref >90)
GFR calc non Af Amer: 73 mL/min — ABNORMAL LOW (ref >90)
Glucose, Bld: 256 mg/dL — ABNORMAL HIGH (ref 70–99)
Potassium: 3.9 mEq/L (ref 3.7–5.3)
Sodium: 133 mEq/L — ABNORMAL LOW (ref 137–147)
Total Protein: 6.7 g/dL (ref 6.0–8.3)

## 2013-08-22 LAB — CBC WITH DIFFERENTIAL/PLATELET
BASOS ABS: 0 10*3/uL (ref 0.0–0.1)
Basophils Relative: 0 % (ref 0–1)
Eosinophils Absolute: 0 10*3/uL (ref 0.0–0.7)
Eosinophils Relative: 0 % (ref 0–5)
HEMATOCRIT: 23.9 % — AB (ref 36.0–46.0)
HEMOGLOBIN: 7.3 g/dL — AB (ref 12.0–15.0)
Lymphocytes Relative: 7 % — ABNORMAL LOW (ref 12–46)
Lymphs Abs: 0.6 10*3/uL — ABNORMAL LOW (ref 0.7–4.0)
MCH: 23.8 pg — ABNORMAL LOW (ref 26.0–34.0)
MCHC: 30.5 g/dL (ref 30.0–36.0)
MCV: 77.9 fL — ABNORMAL LOW (ref 78.0–100.0)
MONO ABS: 0.8 10*3/uL (ref 0.1–1.0)
MONOS PCT: 9 % (ref 3–12)
NEUTROS ABS: 7.6 10*3/uL (ref 1.7–7.7)
Neutrophils Relative %: 84 % — ABNORMAL HIGH (ref 43–77)
Platelets: 336 10*3/uL (ref 150–400)
RBC: 3.07 MIL/uL — ABNORMAL LOW (ref 3.87–5.11)
RDW: 20 % — AB (ref 11.5–15.5)
WBC: 9.1 10*3/uL (ref 4.0–10.5)

## 2013-08-22 LAB — GLUCOSE, CAPILLARY
GLUCOSE-CAPILLARY: 157 mg/dL — AB (ref 70–99)
Glucose-Capillary: 212 mg/dL — ABNORMAL HIGH (ref 70–99)
Glucose-Capillary: 203 mg/dL — ABNORMAL HIGH (ref 70–99)
Glucose-Capillary: 198 mg/dL — ABNORMAL HIGH (ref 70–99)
Glucose-Capillary: 195 mg/dL — ABNORMAL HIGH (ref 70–99)

## 2013-08-22 LAB — FOLATE

## 2013-08-22 LAB — SEDIMENTATION RATE: Sed Rate: 94 mm/hr — ABNORMAL HIGH (ref 0–22)

## 2013-08-22 LAB — FERRITIN: FERRITIN: 204 ng/mL (ref 10–291)

## 2013-08-22 LAB — PREPARE RBC (CROSSMATCH)

## 2013-08-22 LAB — MRSA PCR SCREENING: MRSA by PCR: POSITIVE — AB

## 2013-08-22 LAB — VITAMIN B12: Vitamin B-12: 1364 pg/mL — ABNORMAL HIGH (ref 211–911)

## 2013-08-22 LAB — C-REACTIVE PROTEIN: CRP: 23.2 mg/dL — AB (ref <0.60)

## 2013-08-22 SURGERY — AMPUTATION, FOOT, PARTIAL
Anesthesia: General | Site: Leg Lower | Laterality: Left

## 2013-08-22 MED ORDER — METOCLOPRAMIDE HCL 5 MG/ML IJ SOLN: 5.0000 mg | Freq: Three times a day (TID) | INTRAMUSCULAR | Status: DC | PRN

## 2013-08-22 MED ORDER — CHLORHEXIDINE GLUCONATE 4 % EX LIQD
60.0000 mL | Freq: Once | CUTANEOUS | Status: AC
  Administered 2013-08-22: 4 via TOPICAL
  Filled 2013-08-22: qty 60

## 2013-08-22 MED ORDER — METHOCARBAMOL 500 MG PO TABS
500.0000 mg | ORAL_TABLET | Freq: Four times a day (QID) | ORAL | Status: DC | PRN
  Administered 2013-08-22 – 2013-08-23 (×3): 500 mg via ORAL
  Filled 2013-08-22 (×3): qty 1

## 2013-08-22 MED ORDER — SODIUM CHLORIDE 0.9 % IV SOLN
INTRAVENOUS | Status: DC
  Administered 2013-08-22: 23:00:00 via INTRAVENOUS

## 2013-08-22 MED ORDER — HYDROMORPHONE HCL PF 1 MG/ML IJ SOLN: 0.5000 mg | INTRAMUSCULAR | Status: DC | PRN

## 2013-08-22 MED ORDER — ONDANSETRON HCL 4 MG PO TABS: 4.0000 mg | ORAL_TABLET | Freq: Four times a day (QID) | ORAL | Status: DC | PRN

## 2013-08-22 MED ORDER — ONDANSETRON HCL 4 MG/2ML IJ SOLN
INTRAMUSCULAR | Status: DC | PRN
  Administered 2013-08-22: 4 mg via INTRAVENOUS

## 2013-08-22 MED ORDER — SENNOSIDES-DOCUSATE SODIUM 8.6-50 MG PO TABS: 1.0000 | ORAL_TABLET | Freq: Every evening | ORAL | Status: DC | PRN

## 2013-08-22 MED ORDER — FAMOTIDINE 20 MG PO TABS
20.0000 mg | ORAL_TABLET | Freq: Two times a day (BID) | ORAL | Status: DC
  Administered 2013-08-22 – 2013-08-25 (×7): 20 mg via ORAL
  Filled 2013-08-22 (×9): qty 1

## 2013-08-22 MED ORDER — CLOPIDOGREL BISULFATE 75 MG PO TABS
75.0000 mg | ORAL_TABLET | Freq: Every day | ORAL | Status: DC
Start: 2013-08-22 — End: 2013-08-23
  Filled 2013-08-22 (×2): qty 1

## 2013-08-22 MED ORDER — SERTRALINE HCL 100 MG PO TABS
100.0000 mg | ORAL_TABLET | Freq: Two times a day (BID) | ORAL | Status: DC
  Administered 2013-08-22 – 2013-08-25 (×7): 100 mg via ORAL
  Filled 2013-08-22 (×9): qty 1

## 2013-08-22 MED ORDER — CHLORHEXIDINE GLUCONATE CLOTH 2 % EX PADS
6.0000 | MEDICATED_PAD | Freq: Every day | CUTANEOUS | Status: DC
  Administered 2013-08-22 – 2013-08-25 (×3): 6 via TOPICAL

## 2013-08-22 MED ORDER — ONDANSETRON HCL 4 MG/2ML IJ SOLN: 4.0000 mg | Freq: Four times a day (QID) | INTRAMUSCULAR | Status: DC | PRN

## 2013-08-22 MED ORDER — PIPERACILLIN-TAZOBACTAM 3.375 G IVPB: 3.3750 g | Freq: Three times a day (TID) | INTRAVENOUS | Status: DC

## 2013-08-22 MED ORDER — INSULIN ASPART 100 UNIT/ML ~~LOC~~ SOLN
0.0000 [IU] | Freq: Four times a day (QID) | SUBCUTANEOUS | Status: DC
  Administered 2013-08-22: 5 [IU] via SUBCUTANEOUS
  Administered 2013-08-22: 3 [IU] via SUBCUTANEOUS
  Administered 2013-08-23: 5 [IU] via SUBCUTANEOUS
  Administered 2013-08-23: 3 [IU] via SUBCUTANEOUS
  Administered 2013-08-23 (×2): 5 [IU] via SUBCUTANEOUS
  Administered 2013-08-24 (×2): 3 [IU] via SUBCUTANEOUS

## 2013-08-22 MED ORDER — MIDAZOLAM HCL 2 MG/2ML IJ SOLN
INTRAMUSCULAR | Status: AC
  Filled 2013-08-22: qty 2

## 2013-08-22 MED ORDER — OXYCODONE-ACETAMINOPHEN 5-325 MG PO TABS
1.0000 | ORAL_TABLET | ORAL | Status: DC | PRN
  Administered 2013-08-22: 1 via ORAL
  Administered 2013-08-23: 2 via ORAL
  Filled 2013-08-22: qty 2

## 2013-08-22 MED ORDER — METOCLOPRAMIDE HCL 10 MG PO TABS: 5.0000 mg | ORAL_TABLET | Freq: Three times a day (TID) | ORAL | Status: DC | PRN

## 2013-08-22 MED ORDER — CLOPIDOGREL BISULFATE 75 MG PO TABS
75.0000 mg | ORAL_TABLET | Freq: Every day | ORAL | Status: DC
  Administered 2013-08-23 – 2013-08-25 (×3): 75 mg via ORAL
  Filled 2013-08-22 (×4): qty 1

## 2013-08-22 MED ORDER — OXYCODONE HCL 5 MG PO TABS
5.0000 mg | ORAL_TABLET | Freq: Once | ORAL | Status: AC | PRN
  Administered 2013-08-22: 5 mg via ORAL

## 2013-08-22 MED ORDER — OXYCODONE-ACETAMINOPHEN 5-325 MG PO TABS
1.0000 | ORAL_TABLET | ORAL | Status: DC | PRN
  Administered 2013-08-23 (×2): 2 via ORAL
  Filled 2013-08-22: qty 1
  Filled 2013-08-22 (×4): qty 2

## 2013-08-22 MED ORDER — LACTATED RINGERS IV SOLN
INTRAVENOUS | Status: DC
  Administered 2013-08-22: 17:00:00 via INTRAVENOUS

## 2013-08-22 MED ORDER — MAGNESIUM CITRATE PO SOLN: 1.0000 | Freq: Once | ORAL | Status: AC | PRN

## 2013-08-22 MED ORDER — HEPARIN SODIUM (PORCINE) 5000 UNIT/ML IJ SOLN
5000.0000 [IU] | Freq: Three times a day (TID) | INTRAMUSCULAR | Status: DC
  Administered 2013-08-22: 5000 [IU] via SUBCUTANEOUS
  Filled 2013-08-22 (×4): qty 1

## 2013-08-22 MED ORDER — HYDROMORPHONE HCL PF 1 MG/ML IJ SOLN
INTRAMUSCULAR | Status: AC
  Administered 2013-08-22: 0.5 mg via INTRAVENOUS
  Filled 2013-08-22: qty 1

## 2013-08-22 MED ORDER — PIPERACILLIN-TAZOBACTAM 3.375 G IVPB
3.3750 g | Freq: Three times a day (TID) | INTRAVENOUS | Status: DC
  Administered 2013-08-22 – 2013-08-25 (×10): 3.375 g via INTRAVENOUS
  Filled 2013-08-22 (×12): qty 50

## 2013-08-22 MED ORDER — LIDOCAINE HCL (CARDIAC) 20 MG/ML IV SOLN
INTRAVENOUS | Status: AC
  Filled 2013-08-22: qty 5

## 2013-08-22 MED ORDER — METHOCARBAMOL 1000 MG/10ML IJ SOLN
500.0000 mg | Freq: Four times a day (QID) | INTRAVENOUS | Status: DC | PRN
  Filled 2013-08-22: qty 5

## 2013-08-22 MED ORDER — ONDANSETRON HCL 4 MG/2ML IJ SOLN
INTRAMUSCULAR | Status: AC
  Filled 2013-08-22: qty 2

## 2013-08-22 MED ORDER — DOCUSATE SODIUM 100 MG PO CAPS
100.0000 mg | ORAL_CAPSULE | Freq: Two times a day (BID) | ORAL | Status: DC
  Administered 2013-08-22 – 2013-08-25 (×6): 100 mg via ORAL
  Filled 2013-08-22 (×8): qty 1

## 2013-08-22 MED ORDER — HYDROMORPHONE HCL PF 1 MG/ML IJ SOLN
0.2500 mg | INTRAMUSCULAR | Status: DC | PRN
  Administered 2013-08-22 (×2): 0.5 mg via INTRAVENOUS

## 2013-08-22 MED ORDER — ASPIRIN EC 81 MG PO TBEC
81.0000 mg | DELAYED_RELEASE_TABLET | Freq: Every day | ORAL | Status: DC
  Administered 2013-08-23 – 2013-08-25 (×3): 81 mg via ORAL
  Filled 2013-08-22 (×4): qty 1

## 2013-08-22 MED ORDER — ONDANSETRON HCL 4 MG/2ML IJ SOLN: 4.0000 mg | Freq: Once | INTRAMUSCULAR | Status: DC | PRN

## 2013-08-22 MED ORDER — BISACODYL 5 MG PO TBEC: 5.0000 mg | DELAYED_RELEASE_TABLET | Freq: Every day | ORAL | Status: DC | PRN

## 2013-08-22 MED ORDER — LACTATED RINGERS IV SOLN
INTRAVENOUS | Status: DC | PRN
  Administered 2013-08-22: 17:00:00 via INTRAVENOUS

## 2013-08-22 MED ORDER — PROPOFOL 10 MG/ML IV BOLUS
INTRAVENOUS | Status: DC | PRN
  Administered 2013-08-22: 150 mg via INTRAVENOUS

## 2013-08-22 MED ORDER — METHOCARBAMOL 500 MG PO TABS
ORAL_TABLET | ORAL | Status: AC
  Filled 2013-08-22: qty 1

## 2013-08-22 MED ORDER — SODIUM CHLORIDE 0.9 % IJ SOLN
3.0000 mL | Freq: Two times a day (BID) | INTRAMUSCULAR | Status: DC
  Administered 2013-08-22 – 2013-08-23 (×2): 3 mL via INTRAVENOUS

## 2013-08-22 MED ORDER — INSULIN DETEMIR 100 UNIT/ML ~~LOC~~ SOLN
10.0000 [IU] | Freq: Every day | SUBCUTANEOUS | Status: DC
  Administered 2013-08-22: 10 [IU] via SUBCUTANEOUS
  Filled 2013-08-22 (×2): qty 0.1

## 2013-08-22 MED ORDER — 0.9 % SODIUM CHLORIDE (POUR BTL) OPTIME
TOPICAL | Status: DC | PRN
  Administered 2013-08-22: 1000 mL

## 2013-08-22 MED ORDER — SODIUM CHLORIDE 0.9 % IV SOLN: INTRAVENOUS | Status: DC

## 2013-08-22 MED ORDER — HYDROMORPHONE HCL PF 1 MG/ML IJ SOLN
0.5000 mg | INTRAMUSCULAR | Status: DC | PRN
  Administered 2013-08-23: 1 mg via INTRAVENOUS
  Filled 2013-08-22 (×2): qty 1

## 2013-08-22 MED ORDER — AMITRIPTYLINE HCL 25 MG PO TABS
25.0000 mg | ORAL_TABLET | Freq: Every evening | ORAL | Status: DC | PRN
  Filled 2013-08-22: qty 1

## 2013-08-22 MED ORDER — PIPERACILLIN-TAZOBACTAM 3.375 G IVPB 30 MIN: 3.3750 g | Freq: Once | INTRAVENOUS | Status: DC

## 2013-08-22 MED ORDER — ATORVASTATIN CALCIUM 80 MG PO TABS
80.0000 mg | ORAL_TABLET | Freq: Every day | ORAL | Status: DC
  Administered 2013-08-23 – 2013-08-25 (×3): 80 mg via ORAL
  Filled 2013-08-22 (×4): qty 1

## 2013-08-22 MED ORDER — FENTANYL CITRATE 0.05 MG/ML IJ SOLN
INTRAMUSCULAR | Status: AC
  Filled 2013-08-22: qty 5

## 2013-08-22 MED ORDER — OXYCODONE HCL 5 MG PO TABS
ORAL_TABLET | ORAL | Status: AC
  Administered 2013-08-22: 5 mg via ORAL
  Filled 2013-08-22: qty 1

## 2013-08-22 MED ORDER — ASPIRIN 81 MG PO TABS: 81.0000 mg | ORAL_TABLET | Freq: Every day | ORAL | Status: DC

## 2013-08-22 MED ORDER — METOPROLOL TARTRATE 25 MG PO TABS
25.0000 mg | ORAL_TABLET | Freq: Two times a day (BID) | ORAL | Status: DC
  Administered 2013-08-22 – 2013-08-25 (×8): 25 mg via ORAL
  Filled 2013-08-22 (×9): qty 1

## 2013-08-22 MED ORDER — MIDAZOLAM HCL 5 MG/5ML IJ SOLN
INTRAMUSCULAR | Status: DC | PRN
  Administered 2013-08-22 (×2): 1 mg via INTRAVENOUS

## 2013-08-22 MED ORDER — OXYCODONE HCL 5 MG/5ML PO SOLN: 5.0000 mg | Freq: Once | ORAL | Status: AC | PRN

## 2013-08-22 MED ORDER — FENTANYL CITRATE 0.05 MG/ML IJ SOLN
INTRAMUSCULAR | Status: DC | PRN
  Administered 2013-08-22: 50 ug via INTRAVENOUS
  Administered 2013-08-22: 100 ug via INTRAVENOUS

## 2013-08-22 MED ORDER — LIDOCAINE HCL (CARDIAC) 20 MG/ML IV SOLN
INTRAVENOUS | Status: DC | PRN
  Administered 2013-08-22: 50 mg via INTRAVENOUS

## 2013-08-22 MED ORDER — MUPIROCIN 2 % EX OINT
1.0000 "application " | TOPICAL_OINTMENT | Freq: Two times a day (BID) | CUTANEOUS | Status: DC
  Administered 2013-08-22 – 2013-08-25 (×6): 1 via NASAL
  Filled 2013-08-22: qty 22

## 2013-08-22 SURGICAL SUPPLY — 37 items
BANDAGE GAUZE ELAST BULKY 4 IN (GAUZE/BANDAGES/DRESSINGS) ×2 IMPLANT
BLADE SAW RECIP 87.9 MT (BLADE) ×2 IMPLANT
BLADE SAW SGTL HD 18.5X60.5X1. (BLADE) IMPLANT
BLADE SURG 10 STRL SS (BLADE) IMPLANT
BNDG COHESIVE 4X5 TAN STRL (GAUZE/BANDAGES/DRESSINGS) ×6 IMPLANT
COVER SURGICAL LIGHT HANDLE (MISCELLANEOUS) ×2 IMPLANT
CUFF TOURNIQUET SINGLE 34IN LL (TOURNIQUET CUFF) ×2 IMPLANT
DRAPE U-SHAPE 47X51 STRL (DRAPES) ×2 IMPLANT
DRSG ADAPTIC 3X8 NADH LF (GAUZE/BANDAGES/DRESSINGS) ×2 IMPLANT
DRSG PAD ABDOMINAL 8X10 ST (GAUZE/BANDAGES/DRESSINGS) ×4 IMPLANT
DURAPREP 26ML APPLICATOR (WOUND CARE) ×2 IMPLANT
ELECT REM PT RETURN 9FT ADLT (ELECTROSURGICAL) ×2
ELECTRODE REM PT RTRN 9FT ADLT (ELECTROSURGICAL) ×1 IMPLANT
GLOVE BIOGEL PI IND STRL 9 (GLOVE) ×1 IMPLANT
GLOVE BIOGEL PI INDICATOR 9 (GLOVE) ×1
GLOVE SURG ORTHO 9.0 STRL STRW (GLOVE) ×2 IMPLANT
GOWN STRL REUS W/ TWL XL LVL3 (GOWN DISPOSABLE) ×2 IMPLANT
GOWN STRL REUS W/TWL XL LVL3 (GOWN DISPOSABLE) ×2
KIT BASIN OR (CUSTOM PROCEDURE TRAY) ×2 IMPLANT
KIT ROOM TURNOVER OR (KITS) ×2 IMPLANT
NS IRRIG 1000ML POUR BTL (IV SOLUTION) ×2 IMPLANT
PACK ORTHO EXTREMITY (CUSTOM PROCEDURE TRAY) ×2 IMPLANT
PAD ARMBOARD 7.5X6 YLW CONV (MISCELLANEOUS) ×4 IMPLANT
SPONGE GAUZE 4X4 12PLY (GAUZE/BANDAGES/DRESSINGS) ×2 IMPLANT
SPONGE GAUZE 4X4 12PLY STER LF (GAUZE/BANDAGES/DRESSINGS) ×2 IMPLANT
SPONGE LAP 18X18 X RAY DECT (DISPOSABLE) ×2 IMPLANT
STOCKINETTE IMPERVIOUS LG (DRAPES) ×2 IMPLANT
SUT ETHILON 2 0 PSLX (SUTURE) ×4 IMPLANT
SUT PDS AB 1 CT  36 (SUTURE) ×2
SUT PDS AB 1 CT 36 (SUTURE) ×2 IMPLANT
SUT SILK 2 0 TIES 10X30 (SUTURE) ×2 IMPLANT
SUT VIC AB 2-0 CTB1 (SUTURE) IMPLANT
TOWEL OR 17X24 6PK STRL BLUE (TOWEL DISPOSABLE) ×2 IMPLANT
TOWEL OR 17X26 10 PK STRL BLUE (TOWEL DISPOSABLE) ×2 IMPLANT
TUBE CONNECTING 12X1/4 (SUCTIONS) ×2 IMPLANT
WATER STERILE IRR 1000ML POUR (IV SOLUTION) IMPLANT
YANKAUER SUCT BULB TIP NO VENT (SUCTIONS) ×2 IMPLANT

## 2013-08-22 NOTE — Progress Notes (Signed)
Tried,to page Dr Charlies Silvers about new order for bl transfusion pt already receiving second unit Dr Sharol Given had ordered.

## 2013-08-22 NOTE — Discharge Instructions (Signed)
Change dressing as needed. Okay to cleanse wound with soap and water once dressing is changed.

## 2013-08-22 NOTE — Progress Notes (Addendum)
Patient ID: [name], female   DOB: 26-Aug-1955, 58 y.o.   MRN: [id number] [facility] PROGRESS NOTE  MCKENIZE COMINS [id number] DOB: 03/10/1956 DOA: [id number] PCP: [name], MD  Brief narrative: 58 y.o. female with past medical history of hypertension, diabetes mellitus, right BKA, Charcot foot due to diabetes, CAD s/p CABG who presented to [facility] ED 08/21/2013 with complaints of discharge coming out from her left foot incision. She was in an accident and had significant injury in February of her left foot. She had infection of the left foot and has undergone wound debridement in April. She was on antibiotic at that time. On this admission, her studies included left foot x ray which was significant for resection or resorption of the left second, third, and fourth toes with postoperative or erosive changes which could represent postoperative change versus osteomyelitis. She was started on vanco and zosyn for possible osteomyelitis. Ortho also consulted.  Assessment/Plan:  Principal Problem:   Osteomyelitis of left lower extremity  - Appreciate ortho surgery recommendations, plan for transtibial amputation on the left today - Continue supportive care with PRN analgesia; keep NPO prior to surgery, may continue IV fluids  - Continue vancomycin and zosyn  - Follow up blood culture results  Active Problems:   DIABETES MELLITUS with complications of right BKA, neuropathy - check A1c - will start Levemir 10 units now per DM coordinator input - continue SSI for now - CBG's in past 24 hours: 212, 203   Anxiety and depression - continue sertraline 100 mg PO BID   CAD, ARTERY BYPASS GRAFT - continue aspirin and plavix   Hypertension - continue metoprolol 25 mg PO BID   Anemia of chronic disease - likely due to history of osteomyelitis, diabetes - hemoglobin 7.3 - transfused 2 units PRBC on admission    Hyponatremia - likely due to dehydration - improving with VI fluids   DVT  prophylaxis:no DVT prophylaxis prior to surgery today; will address immediately after surgery; please note on asa and plavix however  Code Status: full code  Family Communication: plan of care discussed with the patient Disposition Plan: home when stable   Robbie Lis, MD  [facility] Pager [phone]  If 7PM-7AM, please contact night-coverage [email or web address] Password [email or web address] 08/22/2013, 2:59 PM   LOS: 1 day   Consultants:  Orthopedic surgery  Procedures:  None so far  Antibiotics:  Vanco 08/21/2013 -->  Zosyn 08/21/2013 -->  HPI/Subjective: No acute overnight events.  Objective: Filed Vitals:   08/22/13 0448 08/22/13 1015 08/22/13 1145 08/22/13 1202  BP: 142/77 137/70 149/73 137/70  Pulse: 93 98 102   Temp: 98.2 F (36.8 C) 98.4 F (36.9 C) 100.9 F (38.3 C)   TempSrc: Oral Oral Oral   Resp: 20 16 16    Height:      Weight:      SpO2: 100%       Intake/Output Summary (Last 24 hours) at 08/22/13 1459 Last data filed at 08/22/13 0448  Gross per 24 hour  Intake    445 ml  Output      0 ml  Net    445 ml    Exam:   General:  Pt is alert, follows commands appropriately, not in acute distress  Cardiovascular: Regular rate and rhythm, S1/S2, no murmurs  Respiratory: Clear to auscultation bilaterally, no wheezing  Abdomen: Soft, non tender, non distended, bowel sounds present  Extremities: Right BKA, LLE with sutures pus draining   Neuro: Grossly nonfocal  Data Reviewed: Basic Metabolic Panel:  Recent Labs Lab 08/21/13 1724 08/21/13 2027 08/22/13 0514  NA 130* 129* 133*  K 4.2 4.3 3.9  CL 91* 92* 96  CO2  --  22 23  GLUCOSE 360* 252* 256*  BUN 8 10 9   CREATININE 0.90 0.89 0.87  CALCIUM  --  8.7 8.9   Liver Function Tests:  Recent Labs Lab 08/22/13 0514  AST 28  ALT 99*  ALKPHOS 186*  BILITOT 1.2  PROT 6.7  ALBUMIN 2.3*   No results found for this basename: LIPASE, AMYLASE,  in the last 168 hours No results found for this  basename: AMMONIA,  in the last 168 hours CBC:  Recent Labs Lab 08/21/13 1724 08/21/13 2027 08/22/13 0514  WBC  --  11.3* 9.1  NEUTROABS  --  9.2* 7.6  HGB 7.5* 6.3* 7.3*  HCT 22.0* 20.8* 23.9*  MCV  --  77.9* 77.9*  PLT  --  366 336   Cardiac Enzymes: No results found for this basename: CKTOTAL, CKMB, CKMBINDEX, TROPONINI,  in the last 168 hours BNP: No components found with this basename: POCBNP,  CBG:  Recent Labs Lab 08/22/13 0635 08/22/13 1118  GLUCAP 212* 203*    MRSA PCR SCREENING     Status: Abnormal   Collection Time    08/22/13  4:49 AM      Result Value Ref Range Status   MRSA by PCR POSITIVE (*) NEGATIVE Final     Studies: Dg Tibia/fibula Left 08/21/2013    IMPRESSION: Postoperative changes with intra medullary rod and screw fixation from the lower tibia through to the calcaneus. Prominent bone erosion of the distal tibia, talus, and upper calcaneus with productive bone in the lower tibia fibular region.   Dg Foot 2 Views Left 08/21/2013    IMPRESSION: Resection or resorption of the left second, third, and fourth toes with postoperative or erosive changes involving the metatarsal heads of second and fourth rays. Changes could represent postoperative change or osteomyelitis.    Scheduled Meds: . aspirin EC  81 mg Oral Daily  . atorvastatin  80 mg Oral Daily  . clopidogrel  75 mg Oral Daily  . famotidine  20 mg Oral BID  . insulin aspart  0-15 Units Subcutaneous Q6H  . metoprolol tartrate  25 mg Oral BID  . mupirocin ointment  1 application Nasal BID  . piperacillin-tazobactam   3.375 g Intravenous 3 times per day  . sertraline  100 mg Oral BID  . vancomycin  1,000 mg Intravenous Q12H

## 2013-08-22 NOTE — Anesthesia Preprocedure Evaluation (Signed)
Anesthesia Evaluation  Patient identified by MRN, date of birth, ID band Patient awake    Reviewed: Allergy & Precautions, H&P , NPO status , Patient's Chart, lab work & pertinent test results  Airway Mallampati: II TM Distance: >3 FB Neck ROM: Full    Dental  (+) Teeth Intact, Dental Advisory Given   Pulmonary  breath sounds clear to auscultation        Cardiovascular hypertension, Rhythm:Regular Rate:Normal     Neuro/Psych    GI/Hepatic   Endo/Other  diabetes  Renal/GU      Musculoskeletal   Abdominal   Peds  Hematology   Anesthesia Other Findings   Reproductive/Obstetrics                           Anesthesia Physical Anesthesia Plan  ASA: III  Anesthesia Plan: General   Post-op Pain Management:    Induction: Intravenous  Airway Management Planned: LMA  Additional Equipment:   Intra-op Plan:   Post-operative Plan:   Informed Consent: I have reviewed the patients History and Physical, chart, labs and discussed the procedure including the risks, benefits and alternatives for the proposed anesthesia with the patient or authorized representative who has indicated his/her understanding and acceptance.   Dental advisory given  Plan Discussed with:   Anesthesia Plan Comments:         Anesthesia Quick Evaluation

## 2013-08-22 NOTE — Consult Note (Signed)
Reason for Consult: Abscess ostomy myelitis left leg status post tibial calcaneal fusion Referring Physician: Dr Tempie Hoist is an 58 y.o. female.  HPI: Patient is a 58 year old woman well known to me with diabetic insensate neuropathy. She is status post foot salvage surgery for tibial calcaneal fusion. Patient states that she is developed drainage from this wound on Tuesday. She states she called the office.was put on hold. She presented last night to the emergency room with drainage.  Past Medical History  Diagnosis Date  . Hypertension   . Neuropathy   . Peripheral vascular disease   . Coronary artery disease   . Anxiety   . Depression   . Anemia   . Type II diabetes mellitus   . High cholesterol   . History of blood transfusion     "one time; related to diabetes I guess"   . Arthritis     "hands"  . GERD (gastroesophageal reflux disease)     takes Zantac  . Complication of anesthesia     pt felt like she had a hard time waking up after surgery in Feb. 2015.    Past Surgical History  Procedure Laterality Date  . Toe amputation Right     "took off a couple toes before the amputation"  . Below knee leg amputation Right Jan. 2008  . Carotid endarterectomy Right   . Cholecystectomy    . Ankle fusion Left 05/11/2013    TIBIOCALCANEAL FUSION   . Tonsillectomy    . Dilation and curettage of uterus    . Coronary artery bypass graft  12/01/2007    "CABG X4" (05/12/2013)  . Ankle fusion Left 05/12/2013    Procedure: LEFT TIBIOCALCANEAL FUSION;  Surgeon: Newt Minion, MD;  Location: [place];  Service: Orthopedics;  Laterality: Left;  Left Tibiocalcaneal Fusion  . Colonoscopy    . Irrigation and debridement abscess Left 06/17/2013    ANKLE           DR [name]  . I&d extremity Left 06/17/2013    Procedure: IRRIGATION AND DEBRIDEMENT EXTREMITY, PLACEMENT ANTIBIOTIC STIMULAN BEADS;  Surgeon: Newt Minion, MD;  Location: [place];  Service: Orthopedics;  Laterality: Left;  .  Application of wound vac  06/17/2013    Procedure: APPLICATION OF WOUND VAC;  Surgeon: Newt Minion, MD;  Location: [facility] OR;  Service: Orthopedics;;    Family History  Problem Relation Age of Onset  . Cancer Mother   . Heart disease Father   . Diabetes Father   . Hyperlipidemia Father   . Hypertension Father     Social History:  reports that she has never smoked. She has never used smokeless tobacco. She reports that she does not drink alcohol or use illicit drugs.  Allergies:  Allergies  Allergen Reactions  . Tape Rash    Redness, rash, itchiness    Medications: I have reviewed the patient's current medications.  Results for orders placed during the hospital encounter of 08/21/13 (from the past 48 hour(s))  I-STAT CHEM 8, ED     Status: Abnormal   Collection Time    08/21/13  5:24 PM      Result Value Ref Range   Sodium 130 (*) 137 - 147 mEq/L   Potassium 4.2  3.7 - 5.3 mEq/L   Chloride 91 (*) 96 - 112 mEq/L   BUN 8  6 - 23 mg/dL   Creatinine, Ser 0.90  0.50 - 1.10 mg/dL   Glucose,  Bld 360 (*) 70 - 99 mg/dL   Calcium, Ion 1.18  1.12 - 1.23 mmol/L   TCO2 24  0 - 100 mmol/L   Hemoglobin 7.5 (*) 12.0 - 15.0 g/dL   HCT 22.0 (*) 36.0 - 46.0 %  TYPE AND SCREEN     Status: None   Collection Time    08/21/13  8:22 PM      Result Value Ref Range   ABO/RH(D) A POS     Antibody Screen NEG     Sample Expiration 08/24/2013     Unit Number [id number]     Blood Component Type RED CELLS,LR     Unit division 00     Status of Unit ISSUED     Transfusion Status OK TO TRANSFUSE     Crossmatch Result Compatible     Unit Number [id number]     Blood Component Type RED CELLS,LR     Unit division 00     Status of Unit ALLOCATED     Transfusion Status OK TO TRANSFUSE     Crossmatch Result Compatible    BASIC METABOLIC PANEL     Status: Abnormal   Collection Time    08/21/13  8:27 PM      Result Value Ref Range   Sodium 129 (*) 137 - 147 mEq/L   Potassium 4.3  3.7 - 5.3 mEq/L    Chloride 92 (*) 96 - 112 mEq/L   CO2 22  19 - 32 mEq/L   Glucose, Bld 252 (*) 70 - 99 mg/dL   BUN 10  6 - 23 mg/dL   Creatinine, Ser 0.89  0.50 - 1.10 mg/dL   Calcium 8.7  8.4 - 10.5 mg/dL   GFR calc non Af Amer 71 (*) >90 mL/min   GFR calc Af Amer 82 (*) >90 mL/min   Comment: (NOTE)     The eGFR has been calculated using the CKD EPI equation.     This calculation has not been validated in all clinical situations.     eGFR's persistently <90 mL/min signify possible Chronic Kidney     Disease.  CBC WITH DIFFERENTIAL     Status: Abnormal   Collection Time    08/21/13  8:27 PM      Result Value Ref Range   WBC 11.3 (*) 4.0 - 10.5 K/uL   RBC 2.67 (*) 3.87 - 5.11 MIL/uL   Hemoglobin 6.3 (*) 12.0 - 15.0 g/dL   Comment: REPEATED TO VERIFY     CRITICAL RESULT CALLED TO, READ BACK BY AND VERIFIED WITH:     C. SHISCO (RN) 2106 08/21/2013 L. LOMAX   HCT 20.8 (*) 36.0 - 46.0 %   MCV 77.9 (*) 78.0 - 100.0 fL   MCH 23.6 (*) 26.0 - 34.0 pg   MCHC 30.3  30.0 - 36.0 g/dL   RDW 20.1 (*) 11.5 - 15.5 %   Platelets 366  150 - 400 K/uL   Neutrophils Relative % 81 (*) 43 - 77 %   Neutro Abs 9.2 (*) 1.7 - 7.7 K/uL   Lymphocytes Relative 9 (*) 12 - 46 %   Lymphs Abs 1.0  0.7 - 4.0 K/uL   Monocytes Relative 10  3 - 12 %   Monocytes Absolute 1.1 (*) 0.1 - 1.0 K/uL   Eosinophils Relative 0  0 - 5 %   Eosinophils Absolute 0.0  0.0 - 0.7 K/uL   Basophils Relative 0  0 - 1 %   Basophils  Absolute 0.0  0.0 - 0.1 K/uL  PROTIME-INR     Status: Abnormal   Collection Time    08/21/13  8:27 PM      Result Value Ref Range   Prothrombin Time 15.7 (*) 11.6 - 15.2 seconds   INR 1.28  0.00 - 1.49  RETICULOCYTES     Status: Abnormal   Collection Time    08/21/13  8:27 PM      Result Value Ref Range   Retic Ct Pct 3.4 (*) 0.4 - 3.1 %   RBC. 2.67 (*) 3.87 - 5.11 MIL/uL   Retic Count, Manual 90.8  19.0 - 186.0 K/uL  PREPARE RBC (CROSSMATCH)     Status: None   Collection Time    08/21/13 11:00 PM      Result  Value Ref Range   Order Confirmation ORDER PROCESSED BY BLOOD BANK    CBC WITH DIFFERENTIAL     Status: Abnormal   Collection Time    08/22/13  5:14 AM      Result Value Ref Range   WBC 9.1  4.0 - 10.5 K/uL   RBC 3.07 (*) 3.87 - 5.11 MIL/uL   Hemoglobin 7.3 (*) 12.0 - 15.0 g/dL   HCT 23.9 (*) 36.0 - 46.0 %   MCV 77.9 (*) 78.0 - 100.0 fL   MCH 23.8 (*) 26.0 - 34.0 pg   MCHC 30.5  30.0 - 36.0 g/dL   RDW 20.0 (*) 11.5 - 15.5 %   Platelets 336  150 - 400 K/uL   Neutrophils Relative % 84 (*) 43 - 77 %   Neutro Abs 7.6  1.7 - 7.7 K/uL   Lymphocytes Relative 7 (*) 12 - 46 %   Lymphs Abs 0.6 (*) 0.7 - 4.0 K/uL   Monocytes Relative 9  3 - 12 %   Monocytes Absolute 0.8  0.1 - 1.0 K/uL   Eosinophils Relative 0  0 - 5 %   Eosinophils Absolute 0.0  0.0 - 0.7 K/uL   Basophils Relative 0  0 - 1 %   Basophils Absolute 0.0  0.0 - 0.1 K/uL    Dg Tibia/fibula Left  08/21/2013   CLINICAL DATA:  Swelling and open sores in the distal lower leg. Rule out osteomyelitis.  EXAM: LEFT TIBIA AND FIBULA - 2 VIEW  COMPARISON:  05/06/2013  FINDINGS: Since the previous study, there has been interval placement of intra medullary rod and screw fixation of the distal tibia through to the calcaneus. There is prominent productive bone around the tibia and tibia fibular joint with bone nonunion mildly displaced distal fibular fracture. There has been significant bone bruise or shown in the talus and upper calcaneus. Loss of distinction of cortical surfaces at the margin of the eroded bone. Diffuse soft tissue swelling. Findings likely to represent osteomyelitis.  IMPRESSION: Postoperative changes with intra medullary rod and screw fixation from the lower tibia through to the calcaneus. Prominent bone erosion of the distal tibia, talus, and upper calcaneus with productive bone in the lower tibia fibular region. Changes likely represent osteomyelitis.   Electronically Signed   By: Lucienne Capers M.D.   On: 08/21/2013 22:12    Dg Foot 2 Views Left  08/21/2013   CLINICAL DATA:  Swelling and open sores in the distal lower leg to rule out osteomyelitis.  EXAM: LEFT FOOT - 2 VIEW  COMPARISON:  Left foot 05/13/2009  FINDINGS: Since the previous study, there has been resection or resorption of the left  second, third, and fourth toes at the metatarsal phalangeal region. There is bone resection or resorption involving the distal aspect of the remaining second and fourth metatarsal bones. Diffuse soft tissue swelling. Changes are nonspecific without interval comparison studies and could represent postoperative change or changes due to osteomyelitis. Diffuse bone demineralization. Cortical sclerosis along the mid shafts of the metatarsal bones. Prominent hallux valgus deformity with prominent degenerative changes in the first metatarsal phalangeal joint.  IMPRESSION: Resection or resorption of the left second, third, and fourth toes with postoperative or erosive changes involving the metatarsal heads of second and fourth rays. Changes could represent postoperative change or osteomyelitis.   Electronically Signed   By: Lucienne Capers M.D.   On: 08/21/2013 22:14    Review of Systems  All other systems reviewed and are negative.  Blood pressure 142/77, pulse 93, temperature 98.2 F (36.8 C), temperature source Oral, resp. rate 20, height '5\' 5"'  (1.651 m), weight 77.111 kg (170 lb), SpO2 100.00%. Physical Exam On examination patient has purulent drainage from the medial aspect of the left ankle. Radiograph shows significant destructive bony changes. Assessment/Plan: Assessment: Abscess and osteomyelitis left ankle status post tibial calcaneal fusion for foot salvage.  Plan: Will plan for transtibial amputation on the left. She has an existing right transtibial amputation. Patient's heparin was discontinued. Plan for surgery approximately 5 PM. Patient will require rehabilitation postoperatively either inpatient rehabilitation or  outpatient rehabilitation.  Meridee Score V 08/22/2013, 6:39 AM

## 2013-08-22 NOTE — Op Note (Signed)
08/21/2013 - 08/22/2013  6:02 PM  PATIENT:  [name]    PRE-OPERATIVE DIAGNOSIS:  Left leg abscess and osteomyelitis  POST-OPERATIVE DIAGNOSIS:  Same  PROCEDURE:  LEFT BELOW KNEE AMPUTATION  SURGEON:  Newt Minion, MD  PHYSICIAN ASSISTANT:None ANESTHESIA:   General  PREOPERATIVE INDICATIONS:  [name] is a  58 y.o. female with a diagnosis of left leg abscess and osteomyelitis who failed conservative measures and elected for surgical management.    The risks benefits and alternatives were discussed with the patient preoperatively including but not limited to the risks of infection, bleeding, nerve injury, cardiopulmonary complications, the need for revision surgery, among others, and the patient was willing to proceed.  OPERATIVE IMPLANTS: None  OPERATIVE FINDINGS: Muscle with a decreased color and contractility. No abscess.  OPERATIVE PROCEDURE: Patient is a 89 or woman status post foot salvage surgery for open reduction internal fixation open Charcot collapse. Patient has failed conservative limb salvage surgery and presents at this time for transtibial amputation. Risks and benefits were discussed including nonhealing of the wound need for higher level amputation. Patient states she understands and wishes to proceed at this time. Description of procedure patient was brought to the operating room and underwent a general anesthetic. After adequate levels of anesthesia were obtained patient's left lower extremity was prepped using DuraPrep draped into a sterile field and an impervious stockinette was used to drape the infected foot and ankle out of the surgical field. A timeout was called. The tourniquet was inflated and a transverse incision was made 11 cm distal to the tibial tubercle. This curved proximally and a large posterior flap was created. The tibia was transected proximal to the skin incision and the fibula was transected just proximal to the tibial incision. A knife  was used to create a large posterior flap. The vascular bundles were suture ligated with 2-0 silk. The tourniquet was deflated hemostasis was obtained. The deep and superficial fascial layers were closed using #1 PDS. The skin was closed using staples. The wound was covered with a compressive wrap including Coban. Patient was extubated taken to the PACU in stable condition.

## 2013-08-22 NOTE — Anesthesia Postprocedure Evaluation (Signed)
  Anesthesia Post-op Note  Patient: [name]  Procedure(s) Performed: Procedure(s): LEFT BELOW KNEE AMPUTATION (Left)  Patient Location: PACU  Anesthesia Type:General  Level of Consciousness: awake, alert  and oriented  Airway and Oxygen Therapy: Patient Spontanous Breathing and Patient connected to nasal cannula oxygen  Post-op Pain: mild  Post-op Assessment: Post-op Vital signs reviewed, Patient's Cardiovascular Status Stable, Respiratory Function Stable, Patent Airway and Pain level controlled  Post-op Vital Signs: stable  Last Vitals:  Filed Vitals:   08/22/13 1815  BP: 167/80  Pulse: 90  Temp:   Resp: 13    Complications: No apparent anesthesia complications

## 2013-08-22 NOTE — Progress Notes (Signed)
Utilization review completed.  

## 2013-08-22 NOTE — Transfer of Care (Signed)
Immediate Anesthesia Transfer of Care Note  Patient: [name]  Procedure(s) Performed: Procedure(s): LEFT BELOW KNEE AMPUTATION (Left)  Patient Location: PACU  Anesthesia Type:General  Level of Consciousness: patient cooperative and responds to stimulation  Airway & Oxygen Therapy: Patient Spontanous Breathing and Patient connected to face mask oxygen  Post-op Assessment: Report given to PACU RN and Post -op Vital signs reviewed and stable  Post vital signs: Reviewed and stable  Complications: No apparent anesthesia complications

## 2013-08-22 NOTE — Progress Notes (Signed)
Inpatient Diabetes Program Recommendations  AACE/ADA: New Consensus Statement on Inpatient Glycemic Control (2013)  Target Ranges:  Prepandial:   less than 140 mg/dL      Peak postprandial:   less than 180 mg/dL (1-2 hours)      Critically ill patients:  140 - 180 mg/dL   Results for CHAMIA, WIESNER (MRN [id number]) as of 08/22/2013 11:04  Ref. Range 08/22/2013 06:35  Glucose-Capillary Latest Range: 70-99 mg/dL 212 (H)   Results for SIMMIE, BALTHAZOR (MRN [id number]) as of 08/22/2013 11:04  Ref. Range 08/21/2013 17:24 08/21/2013 20:27 08/22/2013 05:14  Glucose Latest Range: 70-99 mg/dL 360 (H) 252 (H) 256 (H)   Diabetes history: DM Outpatient Diabetes medications: NPH 35 units QAM, NPH 25 units QPM, Metformin 500 mg BID Current orders for Inpatient glycemic control: Novolog 0-15 units Q6H  Inpatient Diabetes Program Recommendations Insulin - Basal: Please consider ordering low dose basal insulin; recommend starting with Levemir 10 units Q24H (starting now). Correction (SSI): Noted patient will have surgery today around 5pm. Please consider changing frequency from Q6H to Q4H while NPO. HgbA1C: Last A1C was 7.2% on 06/15/2009. Please order an A1C to evaluate glycemic control over the past 2-3 months.  Thanks, Barnie Alderman, RN, MSN, CCRN Diabetes Coordinator Inpatient Diabetes Program [phone] (Team Pager) [phone] ([facility] office) [phone] [facility] office)

## 2013-08-23 ENCOUNTER — Encounter (HOSPITAL_COMMUNITY): Payer: Self-pay | Admitting: Orthopedic Surgery

## 2013-08-23 DIAGNOSIS — I739 Peripheral vascular disease, unspecified: Secondary | ICD-10-CM

## 2013-08-23 DIAGNOSIS — M869 Osteomyelitis, unspecified: Secondary | ICD-10-CM

## 2013-08-23 DIAGNOSIS — L98499 Non-pressure chronic ulcer of skin of other sites with unspecified severity: Secondary | ICD-10-CM

## 2013-08-23 DIAGNOSIS — S88119A Complete traumatic amputation at level between knee and ankle, unspecified lower leg, initial encounter: Secondary | ICD-10-CM

## 2013-08-23 LAB — CBC
HCT: 23.7 % — ABNORMAL LOW (ref 36.0–46.0)
Hemoglobin: 7.3 g/dL — ABNORMAL LOW (ref 12.0–15.0)
MCH: 24.7 pg — ABNORMAL LOW (ref 26.0–34.0)
MCHC: 30.8 g/dL (ref 30.0–36.0)
MCV: 80.3 fL (ref 78.0–100.0)
PLATELETS: 341 10*3/uL (ref 150–400)
RBC: 2.95 MIL/uL — AB (ref 3.87–5.11)
RDW: 19.1 % — ABNORMAL HIGH (ref 11.5–15.5)
WBC: 7.3 10*3/uL (ref 4.0–10.5)

## 2013-08-23 LAB — GLUCOSE, CAPILLARY
GLUCOSE-CAPILLARY: 226 mg/dL — AB (ref 70–99)
Glucose-Capillary: 205 mg/dL — ABNORMAL HIGH (ref 70–99)
Glucose-Capillary: 161 mg/dL — ABNORMAL HIGH (ref 70–99)
Glucose-Capillary: 250 mg/dL — ABNORMAL HIGH (ref 70–99)

## 2013-08-23 LAB — HEMOGLOBIN A1C
HEMOGLOBIN A1C: 7.3 % — AB (ref <5.7)
MEAN PLASMA GLUCOSE: 163 mg/dL — AB (ref <117)

## 2013-08-23 MED ORDER — SODIUM CHLORIDE 0.9 % IV SOLN
INTRAVENOUS | Status: AC
  Administered 2013-08-23: 17:00:00 via INTRAVENOUS

## 2013-08-23 MED ORDER — INSULIN DETEMIR 100 UNIT/ML ~~LOC~~ SOLN
15.0000 [IU] | Freq: Every day | SUBCUTANEOUS | Status: DC
  Administered 2013-08-23 – 2013-08-24 (×2): 15 [IU] via SUBCUTANEOUS
  Filled 2013-08-23 (×3): qty 0.15

## 2013-08-23 MED ORDER — INSULIN ASPART 100 UNIT/ML ~~LOC~~ SOLN
6.0000 [IU] | Freq: Three times a day (TID) | SUBCUTANEOUS | Status: DC
  Administered 2013-08-23: 17:00:00 via SUBCUTANEOUS
  Administered 2013-08-24: 6 [IU] via SUBCUTANEOUS
  Administered 2013-08-24: 14:00:00 via SUBCUTANEOUS

## 2013-08-23 NOTE — Evaluation (Signed)
Physical Therapy Evaluation Patient Details Name: [name] MRN: [id number] DOB: Feb 17, 1956 Today's Date: 08/23/2013   History of Present Illness  pt presents with L BKA with hx of R BKA.    Clinical Impression  Pt adamant about D/C to home and that family is to transport her to OPPT.  Pt very unrealistic about present situation and mobility stating she will get up like she used to and is not acknowledging that she is now a Bil amputee.  Safest D/C plan is for [facility]SNF for more intense therapies and 24/7 nursing care.  Pt's husband works and youngest son can be present to A, but unclear if he is able to perform personal care.  If pt d/c's to home will need HHPT/OT and Aide until capable of transferring to a car to go to [place].  Will continue to follow.      Follow Up Recommendations Home health PT;Supervision/Assistance - 24 hour    Equipment Recommendations  3in1 (PT) (Drop arm)    Recommendations for Other Services       Precautions / Restrictions Precautions Precautions: Fall Restrictions Weight Bearing Restrictions: Yes LLE Weight Bearing: Non weight bearing      Mobility  Bed Mobility Overal bed mobility: Needs Assistance Bed Mobility: Supine to Sit     Supine to sit: Mod assist;HOB elevated     General bed mobility comments: Mod A to bring trunk up to long sitting in bed and MinA to help maintain long sitting.    Transfers Overall transfer level: Needs assistance Equipment used: None Transfers: Lateral/Scoot Transfers          Lateral/Scoot Transfers: Max assist;+2 physical assistance General transfer comment: Utilized pad under hips and 2 person A to laterall scoot to pt's R side to drop arm recliner.  Multiple cues for technique and use of UEs.    Ambulation/Gait                Stairs            Wheelchair Mobility    Modified Rankin (Stroke Patients Only)       Balance Overall balance assessment: Needs assistance Sitting-balance  support: Bilateral upper extremity supported;Feet supported Sitting balance-Leahy Scale: Poor Sitting balance - Comments: pt requires MinA to maintain long sitting in bed.                                       Pertinent Vitals/Pain L residual limb hurts, but did not rate.  Pt premedicated.      Home Living Family/patient expects to be discharged to:: Private residence Living Arrangements: Spouse/significant other;Children Available Help at Discharge: Family;Available 24 hours/day Type of Home: House Home Access: Ramped entrance     Home Layout: One level Home Equipment: Walker - 2 wheels;Bedside commode;Shower seat;Wheelchair - manual      Prior Function Level of Independence: Needs assistance   Gait / Transfers Assistance Needed: Needs set-up of DME and prosthetic.    ADL's / Homemaking Assistance Needed: Family perform Homemaking.  with AE pt able to perform ADLs with only set-up.    Comments: uses prosthetic on right LE     Hand Dominance        Extremity/Trunk Assessment   Upper Extremity Assessment: Defer to OT evaluation           Lower Extremity Assessment: Generalized weakness;RLE deficits/detail;LLE deficits/detail RLE Deficits / Details: Old  R BKA demos good funtcional mobility.   LLE Deficits / Details: New BKA with limited ROM 2/2 pain and edema.       Communication   Communication: No difficulties  Cognition Arousal/Alertness: Awake/alert Behavior During Therapy: WFL for tasks assessed/performed Overall Cognitive Status: Impaired/Different from baseline Area of Impairment: Attention;Safety/judgement;Awareness;Problem solving   Current Attention Level: Selective     Safety/Judgement: Decreased awareness of safety;Decreased awareness of deficits Awareness: Emergent;Anticipatory Problem Solving: Difficulty sequencing;Requires verbal cues;Requires tactile cues General Comments: Unclear if this is pt's baseline level of cognition.   Husband and son present and acted as if this was normal.  pt very unrealistic about current mobility status and states she will just get up like she did before.  pt is now a Bil amputee.  pt with difficulty problem solving through mobility with new amputee.      General Comments      Exercises Amputee Exercises Quad Sets: AROM;Left;5 reps Chair Push Up: AROM;Both;5 reps      Assessment/Plan    PT Assessment Patient needs continued PT services  PT Diagnosis Generalized weakness;Acute pain   PT Problem List Decreased strength;Decreased activity tolerance;Decreased range of motion;Decreased balance;Decreased mobility;Decreased coordination;Decreased cognition;Decreased knowledge of use of DME;Decreased safety awareness;Pain  PT Treatment Interventions DME instruction;Functional mobility training;Therapeutic activities;Therapeutic exercise;Balance training;Cognitive remediation;Patient/family education   PT Goals (Current goals can be found in the Care Plan section) Acute Rehab PT Goals Patient Stated Goal: Home PT Goal Formulation: With patient Time For Goal Achievement: 09/06/13 Potential to Achieve Goals: Good    Frequency Min 3X/week   Barriers to discharge        Co-evaluation               End of Session   Activity Tolerance: Patient limited by fatigue Patient left: in chair;with call bell/phone within reach;with family/visitor present Nurse Communication: Mobility status         Time: [id number] PT Time Calculation (min): 35 min   Charges:   PT Evaluation $Initial PT Evaluation Tier I: 1 Procedure PT Treatments $Therapeutic Activity: 23-37 mins   PT G CodesCatarina Hartshorn, PT [phone] 08/23/2013, 9:58 AM

## 2013-08-23 NOTE — Evaluation (Signed)
Occupational Therapy Evaluation Patient Details Name: [name] MRN: [id number] DOB: 1955-08-20 Today's Date: 08/23/2013    History of Present Illness pt presents with L BKA with hx of R BKA.     Clinical Impression   Pt admitted with the above diagnoses and presents with below problem list. Pt will benefit from continued acute OT to address the below listed deficits and maximize independence with basic ADLs prior to d/c to next venue. Pt seen for bedside eval due to lethargy and pt declining bed mobility due to "resting well for the first time." Discussed home setup and PLOF. PTA pt needed setup assistance with some ADLs. Pt presents with generalized weakness in BUE 3+/5 likely impacting her current level of independence with functional mobility and ADLs. Discussed rehab stay prior to going home to practice transfers. Pt stated she "doesn't rely on her family much." OT to continue to follow.      Follow Up Recommendations  SNF;Supervision/Assistance - 24 hour    Equipment Recommendations  3 in 1 bedside comode;Other (comment) (drop-arm 3n1)    Recommendations for Other Services       Precautions / Restrictions Precautions Precautions: Fall Restrictions Weight Bearing Restrictions: Yes LLE Weight Bearing: Non weight bearing      Mobility Bed Mobility               General bed mobility comments: Pt declined bed mobility  Transfers                      Balance                                            ADL Overall ADL's : Needs assistance/impaired Eating/Feeding: Set up;Sitting   Grooming: Set up;Sitting   Upper Body Bathing: Set up;Bed level   Lower Body Bathing: Sit to/from stand;+2 for physical assistance;Maximal assistance   Upper Body Dressing : Set up;Sitting   Lower Body Dressing: Maximal assistance;+2 for physical assistance;Sit to/from stand   Toilet Transfer: +2 for physical assistance;Transfer board;Moderate  assistance;Requires drop arm   Toileting- Clothing Manipulation and Hygiene: Maximal assistance;+2 for physical assistance;Sit to/from stand   Tub/ Banker: Moderate assistance;+2 for physical assistance;Transfer board;3 in 1 (drop-arm 3n1)   Functional mobility during ADLs: Wheelchair General ADL Comments: Pt very lethargic during session and unable to maintain conversation more than a couple of sentences without falling asleep. Pt declining to come EOB due to reccently getting pain under control and "resting well for the first time." BUE strength is 3+/5 likely impacting pt's level of independence with transfers.      Vision                     Perception     Praxis      Pertinent Vitals/Pain Lethargic, suspect due to medications. Pt stated her pain currently felt under control "for the first time".     Hand Dominance     Extremity/Trunk Assessment Upper Extremity Assessment Upper Extremity Assessment: Generalized weakness   Lower Extremity Assessment Lower Extremity Assessment: Defer to PT evaluation       Communication Communication Communication: No difficulties   Cognition Arousal/Alertness: Lethargic Behavior During Therapy: WFL for tasks assessed/performed Overall Cognitive Status: Impaired/Different from baseline Area of Impairment: Attention;Safety/judgement;Awareness;Problem solving   Current Attention Level: Selective     Safety/Judgement: Decreased  awareness of safety;Decreased awareness of deficits     General Comments: Pt not indicating awareness of functional impact of current baseline deficits.   General Comments       Exercises       Shoulder Instructions      Home Living Family/patient expects to be discharged to:: Private residence Living Arrangements: Spouse/significant other;Children Available Help at Discharge: Family;Available 24 hours/day Type of Home: House Home Access: Ramped entrance     Home Layout: One  level                          Prior Functioning/Environment Level of Independence: Needs assistance  Gait / Transfers Assistance Needed: Needs set-up of DME and prosthetic.       Comments: uses prosthetic on right LE    OT Diagnosis: Acute pain;Generalized weakness   OT Problem List: Decreased activity tolerance;Impaired balance (sitting and/or standing);Decreased strength;Decreased safety awareness;Decreased knowledge of use of DME or AE;Decreased knowledge of precautions;Pain;Other (comment) (bilateral amputee)   OT Treatment/Interventions: Self-care/ADL training;Therapeutic exercise;Energy conservation;DME and/or AE instruction;Therapeutic activities;Patient/family education;Balance training    OT Goals(Current goals can be found in the care plan section) Acute Rehab OT Goals Patient Stated Goal: Home OT Goal Formulation: With patient Time For Goal Achievement: 08/30/13 Potential to Achieve Goals: Good ADL Goals Pt Will Perform Grooming: with min guard assist;sitting;standing Pt Will Perform Lower Body Bathing: with min guard assist;with adaptive equipment;sit to/from stand;sitting/lateral leans Pt Will Perform Lower Body Dressing: with min guard assist;with adaptive equipment;sit to/from stand;sitting/lateral leans Pt Will Transfer to Toilet: with min guard assist;with transfer board (drop arm 3n1 over toilet) Pt Will Perform Toileting - Clothing Manipulation and hygiene: with min guard assist;sitting/lateral leans;sit to/from stand Pt Will Perform Tub/Shower Transfer: with min guard assist;with transfer board;3 in 1 (drop-arm 3n1) Additional ADL Goal #1: Pt will perform sit<>stand at min guard level with rw and right prosthetic leg.  OT Frequency: Min 3X/week   Barriers to D/C:            Co-evaluation              End of Session    Activity Tolerance: Patient limited by lethargy Patient left: in bed;with call bell/phone within reach   Time:  [id number] OT Time Calculation (min): 16 min Charges:  OT General Charges $OT Visit: 1 Procedure OT Evaluation $Initial OT Evaluation Tier I: 1 Procedure G-Codes:    [name] August 26, 2013, 3:04 PM
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
## 2013-08-23 NOTE — Consult Note (Signed)
Physical Medicine and Rehabilitation Consult  Reason for Consult: L-BKA due to abscess and osteomyelitis in patient with R-BKA and diabetic neuropathy.  Referring Physician:  Dr. Sharol Given.    HPI: [name] is a 58 y.o. female with history of HTN, CAD, DM type 2 with insensate neuropathy, R-BKA,  osteomyelitis left foot s/p I and D with tibial calcaneal fusion in attempts at foot salvage. She started developing drainage from wound with fever 103 and presented to ED on 08/21/13 for treatment.  She was started on IV Vanc and Zosyn and exam revealed abscess at medial left ankle with significant destructive changes on Xray. She  underwent L-BKA by Dr. Sharol Given with recommendations to continue IV antibiotics for 3 days past surgery. PT/OT evaluations to be done today. MD recommending CIR for progression.    Review of Systems  HENT: Negative for hearing loss.   Eyes: Negative for blurred vision and double vision.  Respiratory: Negative for cough and shortness of breath.   Cardiovascular: Negative for chest pain and palpitations.  Gastrointestinal: Negative for abdominal pain.  Musculoskeletal: Positive for back pain, joint pain and myalgias.  Neurological: Negative for dizziness, focal weakness and headaches.     Past Medical History  Diagnosis Date  . Hypertension   . Neuropathy   . Peripheral vascular disease   . Coronary artery disease   . Anxiety   . Depression   . Anemia   . Type II diabetes mellitus   . High cholesterol   . History of blood transfusion     "one time; related to diabetes I guess"   . Arthritis     "hands"  . GERD (gastroesophageal reflux disease)     takes Zantac  . Complication of anesthesia     pt felt like she had a hard time waking up after surgery in Feb. 2015.   Past Surgical History  Procedure Laterality Date  . Toe amputation Right     "took off a couple toes before the amputation"  . Below knee leg amputation Right Jan. 2008  . Carotid  endarterectomy Right   . Cholecystectomy    . Ankle fusion Left 05/11/2013    TIBIOCALCANEAL FUSION   . Tonsillectomy    . Dilation and curettage of uterus    . Coronary artery bypass graft  12/01/2007    "CABG X4" (05/12/2013)  . Ankle fusion Left 05/12/2013    Procedure: LEFT TIBIOCALCANEAL FUSION;  Surgeon: Newt Minion, MD;  Location: [facility];  Service: Orthopedics;  Laterality: Left;  Left Tibiocalcaneal Fusion  . Colonoscopy    . Irrigation and debridement abscess Left 06/17/2013    ANKLE           DR [name]  . I&d extremity Left 06/17/2013    Procedure: IRRIGATION AND DEBRIDEMENT EXTREMITY, PLACEMENT ANTIBIOTIC STIMULAN BEADS;  Surgeon: Newt Minion, MD;  Location: [facility];  Service: Orthopedics;  Laterality: Left;  . Application of wound vac  06/17/2013    Procedure: APPLICATION OF WOUND VAC;  Surgeon: Newt Minion, MD;  Location: [facility];  Service: Orthopedics;;   Family History  Problem Relation Age of Onset  . Cancer Mother   . Heart disease Father   . Diabetes Father   . Hyperlipidemia Father   . Hypertension Father    Social History:   Married. Independent without AD PTA. Husband works but son home from college and can provide assistance past discharge. She reports that she has  never smoked. She has never used smokeless tobacco. She reports that she does not drink alcohol or use illicit drugs.   Allergies  Allergen Reactions  . Tape Rash    Redness, rash, itchiness   Medications Prior to Admission  Medication Sig Dispense Refill  . amitriptyline (ELAVIL) 25 MG tablet Take 25 mg by mouth at bedtime as needed for sleep.       Marland Kitchen aspirin 81 MG tablet Take 81 mg by mouth daily.        Marland Kitchen atorvastatin (LIPITOR) 80 MG tablet Take 80 mg by mouth daily.        . clopidogrel (PLAVIX) 75 MG tablet Take 75 mg by mouth daily.       . insulin NPH Human (HUMULIN N,NOVOLIN N) 100 UNIT/ML injection Inject 25-35 Units into the skin 2 (two) times daily before a meal. 35 units in the morning and 25  units in the evening      . metFORMIN (GLUCOPHAGE) 500 MG tablet Take 500 mg by mouth 4 (four) times daily.        . metoprolol tartrate (LOPRESSOR) 25 MG tablet Take 25 mg by mouth 2 (two) times daily.       . Multiple Vitamins-Minerals (MULTIVITAMINS THER. W/MINERALS) TABS Take 1 tablet by mouth daily.        Marland Kitchen oxyCODONE-acetaminophen (ROXICET) 5-325 MG per tablet Take 1 tablet by mouth every 4 (four) hours as needed for severe pain.  60 tablet  0  . quinapril (ACCUPRIL) 40 MG tablet Take 40 mg by mouth daily.       . ranitidine (ZANTAC) 150 MG tablet Take 150 mg by mouth 2 (two) times daily.       . sertraline (ZOLOFT) 100 MG tablet Take 100 mg by mouth 2 (two) times daily.       . silver sulfADIAZINE (SILVADENE) 1 % cream Apply 1 application topically daily.        Home: Home Living Family/patient expects to be discharged to:: Private residence Living Arrangements: Spouse/significant other;Children  Functional History:   Functional Status:  Mobility:          ADL:    Cognition: Cognition Orientation Level: Oriented X4    Blood pressure 164/70, pulse 95, temperature 97.9 F (36.6 C), temperature source Oral, resp. rate 16, height 5\' 5"  (1.651 m), weight 77.111 kg (170 lb), SpO2 99.00%. Physical Exam  Nursing note and vitals reviewed. Constitutional: She appears well-developed and well-nourished.  Sedated and had difficulty staying awake.   HENT:  Head: Normocephalic and atraumatic.  Eyes: Conjunctivae are normal.  Pinpoint pupils.  Neck: Normal range of motion. Neck supple.  Cardiovascular: Normal rate and regular rhythm.   Respiratory: Effort normal and breath sounds normal. No respiratory distress.  GI: Soft. Bowel sounds are normal. She exhibits no distension. There is no tenderness.  Musculoskeletal:  L-BKA with compressive dressing and moderate edema--tender to touch. R-BKA well healed.   Neurological:  Flat affect. Delayed processing and had difficulty  following conversation due to recent narcotics. Falls asleep continuously.   Skin: Skin is warm and dry.  Psychiatric:  Intermittently agitated    Results for orders placed during the hospital encounter of 08/21/13 (from the past 24 hour(s))  GLUCOSE, CAPILLARY     Status: Abnormal   Collection Time    08/22/13 11:18 AM      Result Value Ref Range   Glucose-Capillary 203 (*) 70 - 99 mg/dL   Comment 1 Documented in Chart  Comment 2 Notify RN    PREPARE RBC (CROSSMATCH)     Status: None   Collection Time    08/22/13  3:30 PM      Result Value Ref Range   Order Confirmation ORDER PROCESSED BY BLOOD BANK    GLUCOSE, CAPILLARY     Status: Abnormal   Collection Time    08/22/13  4:16 PM      Result Value Ref Range   Glucose-Capillary 198 (*) 70 - 99 mg/dL  GLUCOSE, CAPILLARY     Status: Abnormal   Collection Time    08/22/13  6:08 PM      Result Value Ref Range   Glucose-Capillary 157 (*) 70 - 99 mg/dL  GLUCOSE, CAPILLARY     Status: Abnormal   Collection Time    08/22/13  9:30 PM      Result Value Ref Range   Glucose-Capillary 195 (*) 70 - 99 mg/dL   Comment 1 Notify RN    GLUCOSE, CAPILLARY     Status: Abnormal   Collection Time    08/23/13  4:28 AM      Result Value Ref Range   Glucose-Capillary 226 (*) 70 - 99 mg/dL   Comment 1 Notify RN     Dg Tibia/fibula Left  08/21/2013   CLINICAL DATA:  Swelling and open sores in the distal lower leg. Rule out osteomyelitis.  EXAM: LEFT TIBIA AND FIBULA - 2 VIEW  COMPARISON:  05/06/2013  FINDINGS: Since the previous study, there has been interval placement of intra medullary rod and screw fixation of the distal tibia through to the calcaneus. There is prominent productive bone around the tibia and tibia fibular joint with bone nonunion mildly displaced distal fibular fracture. There has been significant bone bruise or shown in the talus and upper calcaneus. Loss of distinction of cortical surfaces at the margin of the eroded bone.  Diffuse soft tissue swelling. Findings likely to represent osteomyelitis.  IMPRESSION: Postoperative changes with intra medullary rod and screw fixation from the lower tibia through to the calcaneus. Prominent bone erosion of the distal tibia, talus, and upper calcaneus with productive bone in the lower tibia fibular region. Changes likely represent osteomyelitis.   Electronically Signed   By: Lucienne Capers M.D.   On: 08/21/2013 22:12   Dg Foot 2 Views Left  08/21/2013   CLINICAL DATA:  Swelling and open sores in the distal lower leg to rule out osteomyelitis.  EXAM: LEFT FOOT - 2 VIEW  COMPARISON:  Left foot 05/13/2009  FINDINGS: Since the previous study, there has been resection or resorption of the left second, third, and fourth toes at the metatarsal phalangeal region. There is bone resection or resorption involving the distal aspect of the remaining second and fourth metatarsal bones. Diffuse soft tissue swelling. Changes are nonspecific without interval comparison studies and could represent postoperative change or changes due to osteomyelitis. Diffuse bone demineralization. Cortical sclerosis along the mid shafts of the metatarsal bones. Prominent hallux valgus deformity with prominent degenerative changes in the first metatarsal phalangeal joint.  IMPRESSION: Resection or resorption of the left second, third, and fourth toes with postoperative or erosive changes involving the metatarsal heads of second and fourth rays. Changes could represent postoperative change or osteomyelitis.   Electronically Signed   By: Lucienne Capers M.D.   On: 08/21/2013 22:14    Assessment/Plan: Diagnosis: left BKA.  1. Does the need for close, 24 hr/day medical supervision in concert with the patient's rehab needs  make it unreasonable for this patient to be served in a less intensive setting? Yes 2. Co-Morbidities requiring supervision/potential complications: DM, CAD 3. Due to bladder management, bowel management,  safety, skin/wound care, disease management, medication administration, pain management and patient education, does the patient require 24 hr/day rehab nursing? Yes 4. Does the patient require coordinated care of a physician, rehab nurse, PT (1-2 hrs/day, 5 days/week) and OT (1-2 hrs/day, 5 days/week) to address physical and functional deficits in the context of the above medical diagnosis(es)? Yes Addressing deficits in the following areas: balance, endurance, locomotion, strength, transferring, bowel/bladder control, bathing, dressing, feeding and grooming 5. Can the patient actively participate in an intensive therapy program of at least 3 hrs of therapy per day at least 5 days per week? Yes 6. The potential for patient to make measurable gains while on inpatient rehab is excellent 7. Anticipated functional outcomes upon discharge from inpatient rehab are modified independent  with PT, modified independent with OT, n/a with SLP. 8. Estimated rehab length of stay to reach the above functional goals is: 7 days 9. Does the patient have adequate social supports to accommodate these discharge functional goals? Yes 10. Anticipated D/C setting: Home 11. Anticipated post D/C treatments: [place] therapy 12. Overall Rehab/Functional Prognosis: excellent  RECOMMENDATIONS: This patient's condition is appropriate for continued rehabilitative care in the following setting: CIR Patient has agreed to participate in recommended program. No and Potentially Note that insurance prior authorization may be required for reimbursement for recommended care.  Comment: Pt and family seem to prefer to go home. Patient (although she is still lethargic today) becomes irritated with the thought of coming to inpatient rehab. We will follow along for now. Explained to her/family the different techniques and circumstances which will change how she moves in the short term until she has a second prosthesis.  Meredith Staggers, MD,  [facility]     08/23/2013

## 2013-08-23 NOTE — Care Management Note (Signed)
CARE MANAGEMENT NOTE 08/23/2013  Patient:  [name], [name]   Account Number:  [id number]  Date Initiated:  08/23/2013  Documentation initiated by:  Ricki Miller  Subjective/Objective Assessment:   58 yr old female s/p Left BKA. Patient has old right BKA.     Action/Plan:   Case manager spoke with patient and husband concerning home health versus Rehab.Case Manager will continue to follow.   Anticipated DC Date:     Anticipated DC Plan:        DC Planning Services  CM consult      Choice offered to / List presented to:             Status of service:  In process, will continue to follow

## 2013-08-23 NOTE — Progress Notes (Signed)
Inpatient Diabetes Program Recommendations  AACE/ADA: New Consensus Statement on Inpatient Glycemic Control (2013)  Target Ranges:  Prepandial:   less than 140 mg/dL      Peak postprandial:   less than 180 mg/dL (1-2 hours)      Critically ill patients:  140 - 180 mg/dL   Results for [name], [name] (MRN [id number]) as of 08/23/2013 11:55  Ref. Range 08/22/2013 06:35 08/22/2013 11:18 08/22/2013 16:16 08/22/2013 18:08 08/22/2013 21:30 08/23/2013 04:28 08/23/2013 11:12  Glucose-Capillary Latest Range: 70-99 mg/dL 212 (H) 203 (H) 198 (H) 157 (H) 195 (H) 226 (H) 205 (H)   Diabetes history: DM  Outpatient Diabetes medications: NPH 35 units QAM, NPH 25 units QPM, Metformin 500 mg BID  Current orders for Inpatient glycemic control: Levemir 10 units QHS, Novolog 0-15 units Q6H  Inpatient Diabetes Program Recommendations Insulin - Basal: Please consider increasing Levemir to 15 units QHS. Correction (SSI): Please consider changing frequency of CBGs and Novolog correction from Q6H to ACHS if patient is eating well. Insulin - Meal Coverage: Please consider ordering Novolog 6 units TID with meals for meal coverage.  Thanks, Barnie Alderman, RN, MSN, CCRN Diabetes Coordinator Inpatient Diabetes Program [phone] (Team Pager) [phone] ([facility] office) [phone] [facility] office)

## 2013-08-23 NOTE — Progress Notes (Signed)
Patient ID: [name], female   DOB: 02-22-1956, 58 y.o.   MRN: [id number] Postoperative day 1 left transtibial amputation. Small amount of blood on the lateral aspect of the dressing. Patient states it's the best night of sleep that she's had. Recommend continue IV antibiotics for 3 days postoperatively and then may discontinue. Orders were written for both inpatient and outpatient rehabilitation.

## 2013-08-24 DIAGNOSIS — M86179 Other acute osteomyelitis, unspecified ankle and foot: Secondary | ICD-10-CM

## 2013-08-24 LAB — GLUCOSE, CAPILLARY
GLUCOSE-CAPILLARY: 115 mg/dL — AB (ref 70–99)
GLUCOSE-CAPILLARY: 182 mg/dL — AB (ref 70–99)
GLUCOSE-CAPILLARY: 162 mg/dL — AB (ref 70–99)
GLUCOSE-CAPILLARY: 157 mg/dL — AB (ref 70–99)
Glucose-Capillary: 92 mg/dL (ref 70–99)

## 2013-08-24 MED ORDER — HYDROCODONE-ACETAMINOPHEN 5-325 MG PO TABS
1.0000 | ORAL_TABLET | Freq: Four times a day (QID) | ORAL | Status: DC | PRN
Start: 2013-08-24 — End: 2013-08-25
  Administered 2013-08-24 (×2): 2 via ORAL
  Filled 2013-08-24 (×2): qty 2

## 2013-08-24 MED ORDER — ALPRAZOLAM 0.5 MG PO TABS
0.5000 mg | ORAL_TABLET | Freq: Three times a day (TID) | ORAL | Status: DC | PRN
  Administered 2013-08-24: 0.5 mg via ORAL
  Filled 2013-08-24 (×2): qty 1

## 2013-08-24 NOTE — Progress Notes (Signed)
Patient ID: [name], female   DOB: [birth year], 58 y.o.   MRN: [id number] Patient is still confused this morning. She states that someone changed her room. She is still in her same  room from admission. The dressing is clean and dry. I will discontinue the IV narcotics. Anticipate discharge to outpatient rehabilitation.

## 2013-08-24 NOTE — Progress Notes (Signed)
Patient plans to go to CIR. If not accepted patient will go home with home health. Clinical Social Worker will sign off for now as social work intervention is no longer needed. Please consult [place] again if new need arises.   Rhea Pink, MSW, [facility]

## 2013-08-24 NOTE — Progress Notes (Signed)
Agree with PTA.    Lennin Osmond, PT [phone]  

## 2013-08-24 NOTE — Progress Notes (Signed)
Physical Therapy Treatment Patient Details Name: LILLYNN LUPO MRN: [id number] DOB: 08-Jul-1955 Today's Date: 08/24/2013    History of Present Illness pt presents with L BKA with hx of R BKA.      PT Comments    Attempting to work with patient however she ADAMANTLY refused therapy session this morning stating that she was not going to work with therapy anymore. Patients sons were both present and will be caregivers at home. Patients son was attempting to encourage patient to get up with therapy. At that point patient began to scream at her son and stated that she was going to cut him off. Other son stated this is how patient can be at home. When talking with the patient about caregivers at home patient stated that she did not need help at home. She then stated that she had laid on the floor for 3 hours before and was fine. Patient educated that home is not a safe discharge plan and per CIR, she was left brochures to look over. At this time, I cannot recommend safe DC home and recommend CIR. If patient does not agree to CIR then SNF is most appropriate. Will follow up tomorrow to discuss further plans.   Follow Up Recommendations  Supervision/Assistance - 24 hour;CIR (if patient not willing recommend SNF)     Equipment Recommendations  3in1 (PT)    Recommendations for Other Services       Precautions / Restrictions Precautions Precautions: Fall Restrictions Weight Bearing Restrictions: Yes LLE Weight Bearing: Non weight bearing    Mobility  Bed Mobility                  Transfers                    Ambulation/Gait                 Stairs            Wheelchair Mobility    Modified Rankin (Stroke Patients Only)       Balance                                    Cognition Arousal/Alertness: Awake/alert Behavior During Therapy: Agitated Overall Cognitive Status: Impaired/Different from baseline Area of Impairment:  Attention;Safety/judgement;Awareness;Problem solving         Safety/Judgement: Decreased awareness of safety;Decreased awareness of deficits     General Comments: Pt not indicating awareness of functional impact of current baseline deficits.    Exercises      General Comments        Pertinent Vitals/Pain no apparent distress     Home Living                      Prior Function            PT Goals (current goals can now be found in the care plan section)      Frequency  Min 3X/week    PT Plan Discharge plan needs to be updated    Co-evaluation             End of Session   Activity Tolerance:  (Patient verbalized understanding of recommendation) Patient left: in bed;with call bell/phone within reach;with family/visitor present     Time: [id number] PT Time Calculation (min): 17 min  Charges:  $Self Care/Home Management: 8-22  G Codes:      Jacqualyn Posey 08/24/2013, 11:52 AM  08/24/2013 Jacqualyn Posey PTA [phone] pager [phone] office

## 2013-08-24 NOTE — Progress Notes (Signed)
Rehab admissions - I stopped by the room and was invited in to speak with patient about inpatient rehab.  She tells me that after her first amputation, she went to rehab for 3 weeks.  She said that she hated it and that it did not help her to manage at home.  I did give her booklets again about inpatient rehab.  Her 2 sons are at the bedside.  At this time, patient does not wish to admit to acute inpatient rehab.  I told her to call me if she changes her mind and gave her my card.  Call me for questions.  [id number]

## 2013-08-24 NOTE — Progress Notes (Signed)
ANTIBIOTIC CONSULT NOTE - FOLLOW UP  Pharmacy Consult:  Vancomycin / Zosyn Indication:  Osteomyelitis  Allergies  Allergen Reactions  . Tape Rash    Redness, rash, itchiness    Patient Measurements: Height: 5\' 5"  (165.1 cm) Weight: 170 lb (77.111 kg) IBW/kg (Calculated) : 57  Vital Signs: Temp: 98.1 F (36.7 C) (06/10 0626) Temp src: Oral (06/10 0626) BP: 131/73 mmHg (06/10 0626) Pulse Rate: 89 (06/10 0626) Intake/Output from previous day: 06/09 0701 - 06/10 0700 In: 2040 [P.O.:840; I.V.:1200] Out: -  Intake/Output from this shift: Total I/O In: 240 [P.O.:240] Out: -   Labs:  Recent Labs  08/21/13 1724 08/21/13 2027 08/22/13 0514 08/23/13 1330  WBC  --  11.3* 9.1 7.3  HGB 7.5* 6.3* 7.3* 7.3*  PLT  --  366 336 341  CREATININE 0.90 0.89 0.87  --    Estimated Creatinine Clearance: 74.1 ml/min (by C-G formula based on Cr of 0.87). No results found for this basename: VANCOTROUGH, Corlis Leak, VANCORANDOM, GENTTROUGH, GENTPEAK, GENTRANDOM, TOBRATROUGH, TOBRAPEAK, TOBRARND, AMIKACINPEAK, AMIKACINTROU, AMIKACIN,  in the last 72 hours   Microbiology: Recent Results (from the past 720 hour(s))  CULTURE, BLOOD (ROUTINE X 2)     Status: None   Collection Time    08/21/13  8:10 PM      Result Value Ref Range Status   Specimen Description BLOOD RIGHT ARM   Final   Special Requests BOTTLES DRAWN AEROBIC AND ANAEROBIC 10CC EA   Final   Culture  Setup Time     Final   Value: 08/22/2013 01:52     Performed at [facility]   Culture     Final   Value:        BLOOD CULTURE RECEIVED NO GROWTH TO DATE CULTURE WILL BE HELD FOR 5 DAYS BEFORE ISSUING A FINAL NEGATIVE REPORT     Performed at [facility]   Report Status PENDING   Incomplete  CULTURE, BLOOD (ROUTINE X 2)     Status: None   Collection Time    08/21/13  8:20 PM      Result Value Ref Range Status   Specimen Description BLOOD RIGHT HAND   Final   Special Requests BOTTLES DRAWN AEROBIC ONLY 10CC    Final   Culture  Setup Time     Final   Value: 08/22/2013 01:52     Performed at [facility]   Culture     Final   Value:        BLOOD CULTURE RECEIVED NO GROWTH TO DATE CULTURE WILL BE HELD FOR 5 DAYS BEFORE ISSUING A FINAL NEGATIVE REPORT     Performed at [facility]   Report Status PENDING   Incomplete  MRSA PCR SCREENING     Status: Abnormal   Collection Time    08/22/13  4:49 AM      Result Value Ref Range Status   MRSA by PCR POSITIVE (*) NEGATIVE Final   Comment:            The GeneXpert MRSA Assay (FDA     approved for NASAL specimens     only), is one component of a     comprehensive MRSA colonization     surveillance program. It is not     intended to diagnose MRSA     infection nor to guide or     monitor treatment for     MRSA infections.     RESULT CALLED TO, READ  BACK BY AND VERIFIED WITH:     [facility] RN [id number] AT 0652 SKEEN,P      Assessment: 40 YOF with left foot fracture who has been on vancomycin for osteomyelitis during her last admission in April. Difficult to tell by discharge notes and outpatient office notes if she was taking this at home (not on med per med history).  She is s/p transtibial amputation on 08/22/13 and remains on vancomycin and Zosyn.  Her renal function has been stable.  Vanc 6/7 >> Zosyn 6/8 >>  6/8 MRSA PCR - positive 6/7  BCx x2 - NGTD   Goal of Therapy:  Vancomycin trough level 15-20 mcg/ml   Plan:  - Vanc 1gm IV Q12H - Zosyn 3.375gm IV Q8H, 4 hr infusion - Check BMET in AM - No plan for VT since Ortho plans to d/c abx ~3 days post-op    Lynton Crescenzo D. Mina Marble, PharmD, BCPS Pager:  [phone] 08/24/2013, 10:49 AM

## 2013-08-24 NOTE — Progress Notes (Signed)
PROGRESS NOTE  [name] [id number] DOB: [birth year] DOA: [id number] PCP: [name], MD  Summary: 58 year old woman with history of diabetes as well as peripheral vascular disease who presented with draining left foot wound that had been followed by wound care clinic as an outpatient. She developed fever and confusion he was admitted for osteomyelitis of the left lower extremity, anemia requiring transfusion  Assessment/Plan: 1. Osteomyelitis left lower extremity (distal tibia, talus,  calcaneus) status post transtibial amputation 6/8. 2. Possible acute encephalopathy. Likely secondary to narcotics. Agree discontinue IV narcotics. 3. Anemia. Anemia chronic disease suspected. 4. Diabetes mellitus uncontrolled with history of neuropathy, peripheral vascular disease, Charcot foot. Hold metformin while hospitalized. Hemoglobin A1c 7.3 5. Hyponatremia. Appears clinically asymptomatic, likely resolved at this point. 6. Microcytic anemia. Ferritin normal. Suspect anemia of chronic disease. Status post transfusion on admission. Followup as an outpatient. 7. Elevated ALT. 8. H/o Charcot foot with fusion. 9. H/o PVD, peripheral neuropathy, CABG 10. Anxiety, depression, appears stable, continue Zoloft.   Continue postsurgical care. We discussed inpatient and outpatient rehabilitation options. Patient is considering. Continue antibiotics. Will discuss final regimen with infectious disease.  Minimize narcotics.  CMP, CBC in the morning  Code Status: full code DVT prophylaxis: TED hose (no heparin with anemia) Family Communication: discussed with 2 sons at bedside Disposition Plan: pending, likely ready for d/c next 24 hours  Murray Hodgkins, MD  [facility]  Pager [phone] If 7PM-7AM, please contact night-coverage at [email or web address], password [facility] 08/24/2013, 9:19 AM  LOS: 3 days   Consultants:  Orthopedics  Inpatient rehabilitation  Physical therapy home health PT, 3  in 1  Procedures:  2 units packed red blood cell transfusion on admission  6/8 left transtibial amputation  Antibiotics: Vanco 08/21/2013 --> 6/11? Zosyn 08/21/2013 --> 6/11?  HPI/Subjective: The patient attempted to get up overnight and called 911. Per nursing notes, the patient does become confused at home at baseline and attempts to get up at times. Family requested minimization of narcotics.  Patient recalls the events of last night, reports that she was sitting up in the bed but not attempting to go on anywhere. She denies confusion today but does report she remembers acting at a yesterday which she attributes to narcotics.  Objective: Filed Vitals:   08/23/13 0448 08/23/13 1400 08/23/13 1955 08/24/13 0626  BP: 164/70 116/50 146/74 131/73  Pulse: 95 79 109 89  Temp: 97.9 F (36.6 C) 98.1 F (36.7 C) 98.6 F (37 C) 98.1 F (36.7 C)  TempSrc:  Oral Oral Oral  Resp: 16 18 16 16   Height:      Weight:      SpO2: 99% 98% 93% 90%    Intake/Output Summary (Last 24 hours) at 08/24/13 0919 Last data filed at 08/24/13 0900  Gross per 24 hour  Intake   2040 ml  Output      0 ml  Net   2040 ml     Filed Weights   08/21/13 1653  Weight: 77.111 kg (170 lb)    Exam:   Afebrile, vital signs are stable. No hypoxia. Gen. Appears calm and comfortable. Psych. Grossly normal mood and affect. Speech fluent and appropriate. Oriented to self, month, year, president, location. Cardiovascular. Regular rate and rhythm. No murmur, rub or gallop.  Respiratory. Clear to auscultation bilaterally. No wheezes, rales or rhonchi. Normal respiratory effort. Abdomen. Soft.  Data Reviewed: Chemistry: Capillary blood sugars stable. No hypoxia. Fasting blood sugar 92. Heme:  ID: Blood cultures no growth to date.  It does not appear that the bone culture was sent.  Scheduled Meds: . aspirin EC  81 mg Oral Daily  . atorvastatin  80 mg Oral Daily  . Chlorhexidine Gluconate Cloth  6 each Topical  Q0600  . clopidogrel  75 mg Oral Q breakfast  . docusate sodium  100 mg Oral BID  . famotidine  20 mg Oral BID  . insulin aspart  0-15 Units Subcutaneous Q6H  . insulin aspart  6 Units Subcutaneous TID WC  . insulin detemir  15 Units Subcutaneous QHS  . metoprolol tartrate  25 mg Oral BID  . mupirocin ointment  1 application Nasal BID  . piperacillin-tazobactam (ZOSYN)  IV  3.375 g Intravenous 3 times per day  . sertraline  100 mg Oral BID  . sodium chloride  3 mL Intravenous Q12H  . vancomycin  1,000 mg Intravenous Q12H   Continuous Infusions:   Principal Problem:   Osteomyelitis Active Problems:   DIABETES MELLITUS   ANXIETY DEPRESSION   CAD, ARTERY BYPASS GRAFT   CAROTID ENDARTERECTOMY, RIGHT, HX OF   PVD (peripheral vascular disease)   Charcot foot due to diabetes mellitus   Time spent 25 minutes

## 2013-08-24 NOTE — Evaluation (Signed)
Occupational Therapy Evaluation Patient Details Name: [name] MRN: [id number] DOB: 1955/08/07 Today's Date: 08/24/2013    History of Present Illness pt presents with L BKA with hx of R BKA.     Clinical Impression   Pt seen for acute OT treatment session. Pt asked questions about what we would be doing and was adamant about not going to the recliner. Explained benefit of changing positions and offered to work on sit<>stands instead. Pt agreeable to this option. Pt very unsteady and unsafe technique to stand with right prosthetic leg on, rw, and +2 mod-max physical A. Educated on safe technique with walker. Discussed continued recommendation for additional rehab prior to d/c. Pt acknowledged someone from CIR had been by and pt recalled her name. Explained benefits of rehab prior to going home to reduce risk of injury due to fall. Pt receptive to discussion but not stating a decision either way. Encouraged pt to work with therapy and consider rehab stay.    Follow Up Recommendations  SNF;Supervision/Assistance - 24 hour; CIR     Equipment Recommendations  3 in 1 bedside comode;Other (comment)    Recommendations for Other Services       Precautions / Restrictions Precautions Precautions: Fall Precaution Comments: pt demonstrates significantly decreased safety awareness Required Braces or Orthoses: Other Brace/Splint (right prosthetic ) Restrictions Weight Bearing Restrictions: Yes LLE Weight Bearing: Non weight bearing      Mobility Bed Mobility Overal bed mobility: Needs Assistance Bed Mobility: Supine to Sit     Supine to sit: Min guard;HOB elevated     General bed mobility comments: Pt used bed rails to scoot up in bed.   Transfers Overall transfer level: Needs assistance Equipment used: Rolling walker (2 wheeled) Transfers: Sit to/from Stand Sit to Stand: +2 physical assistance;Max assist              Balance Overall balance assessment: Needs  assistance Sitting-balance support: Bilateral upper extremity supported Sitting balance-Leahy Scale: Fair     Standing balance support: Bilateral upper extremity supported;During functional activity   Standing balance comment: Pt needing +2 mod physical A during standing. Stood 2x for 1.5 minutes each time                            ADL Overall ADL's : Needs assistance/impaired                                       General ADL Comments: Pt still at max - +2 physical A for ADLs. Educated pt on need for assistance for ADLs.     Vision                     Perception     Praxis      Pertinent Vitals/Pain Moderate pain. Increased activity during session.     Hand Dominance     Extremity/Trunk Assessment             Communication     Cognition Arousal/Alertness: Awake/alert Behavior During Therapy: WFL for tasks assessed/performed Overall Cognitive Status: Impaired/Different from baseline Area of Impairment: Attention;Safety/judgement;Awareness;Problem solving         Safety/Judgement: Decreased awareness of safety;Decreased awareness of deficits   Problem Solving: Difficulty sequencing;Requires verbal cues;Requires tactile cues General Comments: Pt still stating she "doesn't see how anything's going to be different." Pt giving  conflicting information. Stated she likes to figure how to do things herself and feels she does fine that way but a few minutes later stating she has fallen many times in the last month. Pt stated she used no assistive device prior to surgery and did not put ant weight through LLE, hopped for ambulation. Also stating she used a w/c and had rw at home.   General Comments       Exercises       Shoulder Instructions      Home Living                                          Prior Functioning/Environment               OT Diagnosis:     OT Problem List:     OT  Treatment/Interventions:      OT Goals(Current goals can be found in the care plan section) Acute Rehab OT Goals Patient Stated Goal: "get back to the way I was before surgery", travel with family OT Goal Formulation: With patient Time For Goal Achievement: 08/30/13 Potential to Achieve Goals: Good ADL Goals Pt Will Perform Grooming: with min guard assist;sitting;standing Pt Will Perform Lower Body Bathing: with min guard assist;with adaptive equipment;sit to/from stand;sitting/lateral leans Pt Will Perform Lower Body Dressing: with min guard assist;with adaptive equipment;sit to/from stand;sitting/lateral leans Pt Will Transfer to Toilet: with min guard assist;with transfer board Pt Will Perform Toileting - Clothing Manipulation and hygiene: with min guard assist;sitting/lateral leans;sit to/from stand Pt Will Perform Tub/Shower Transfer: with min guard assist;with transfer board;3 in 1 Additional ADL Goal #1: Pt will perform sit<>stand at min guard level with rw and right prosthetic leg.  OT Frequency: Min 3X/week   Barriers to D/C:            Co-evaluation              End of Session Equipment Utilized During Treatment: Gait belt;Rolling walker Nurse Communication: Mobility status  Activity Tolerance: Patient tolerated treatment well;Patient limited by pain Patient left: in bed;with call bell/phone within reach;with nursing/sitter in room   Time: [id number] OT Time Calculation (min): 47 min Charges:  OT General Charges $OT Visit: 1 Procedure OT Treatments $Self Care/Home Management : 8-22 mins $Therapeutic Activity: 23-37 mins G-Codes:    Hortencia Pilar 09/20/13, 3:47 PM

## 2013-08-25 LAB — TYPE AND SCREEN
ABO/RH(D): A POS
ANTIBODY SCREEN: NEGATIVE
UNIT DIVISION: 0
Unit division: 0
Unit division: 0

## 2013-08-25 LAB — CBC
HEMATOCRIT: 22.9 % — AB (ref 36.0–46.0)
HEMOGLOBIN: 6.8 g/dL — AB (ref 12.0–15.0)
MCH: 23.8 pg — ABNORMAL LOW (ref 26.0–34.0)
MCHC: 29.7 g/dL — AB (ref 30.0–36.0)
MCV: 80.1 fL (ref 78.0–100.0)
Platelets: 353 10*3/uL (ref 150–400)
RBC: 2.86 MIL/uL — ABNORMAL LOW (ref 3.87–5.11)
RDW: 19 % — ABNORMAL HIGH (ref 11.5–15.5)
WBC: 6.9 10*3/uL (ref 4.0–10.5)

## 2013-08-25 LAB — COMPREHENSIVE METABOLIC PANEL
ALK PHOS: 152 U/L — AB (ref 39–117)
ALT: 55 U/L — ABNORMAL HIGH (ref 0–35)
AST: 33 U/L (ref 0–37)
Albumin: 2 g/dL — ABNORMAL LOW (ref 3.5–5.2)
BUN: 13 mg/dL (ref 6–23)
CHLORIDE: 99 meq/L (ref 96–112)
CO2: 19 mEq/L (ref 19–32)
CREATININE: 1.59 mg/dL — AB (ref 0.50–1.10)
Calcium: 8.8 mg/dL (ref 8.4–10.5)
GFR calc non Af Amer: 35 mL/min — ABNORMAL LOW (ref >90)
GFR, EST AFRICAN AMERICAN: 41 mL/min — AB (ref >90)
GLUCOSE: 104 mg/dL — AB (ref 70–99)
POTASSIUM: 4.6 meq/L (ref 3.7–5.3)
Sodium: 137 mEq/L (ref 137–147)
Total Bilirubin: 0.4 mg/dL (ref 0.3–1.2)
Total Protein: 6.7 g/dL (ref 6.0–8.3)

## 2013-08-25 LAB — GLUCOSE, CAPILLARY
Glucose-Capillary: 91 mg/dL (ref 70–99)
Glucose-Capillary: 121 mg/dL — ABNORMAL HIGH (ref 70–99)
Glucose-Capillary: 112 mg/dL — ABNORMAL HIGH (ref 70–99)

## 2013-08-25 LAB — PREPARE RBC (CROSSMATCH)

## 2013-08-25 MED ORDER — HYDROCODONE-ACETAMINOPHEN 5-325 MG PO TABS: 1.0000 | ORAL_TABLET | Freq: Four times a day (QID) | ORAL | Status: DC | PRN

## 2013-08-25 MED ORDER — FAMOTIDINE 20 MG PO TABS
20.0000 mg | ORAL_TABLET | Freq: Every day | ORAL | Status: DC
  Administered 2013-08-25: 20 mg via ORAL
  Filled 2013-08-25: qty 1

## 2013-08-25 MED ORDER — CIPROFLOXACIN HCL 500 MG PO TABS
500.0000 mg | ORAL_TABLET | Freq: Two times a day (BID) | ORAL | Status: DC
  Administered 2013-08-25: 500 mg via ORAL
  Filled 2013-08-25 (×3): qty 1

## 2013-08-25 MED ORDER — PEG 3350-KCL-NA BICARB-NACL 420 G PO SOLR
4000.0000 mL | Freq: Once | ORAL | Status: DC
  Filled 2013-08-25: qty 4000

## 2013-08-25 MED ORDER — SULFAMETHOXAZOLE-TRIMETHOPRIM 400-80 MG PO TABS
2.0000 | ORAL_TABLET | Freq: Two times a day (BID) | ORAL | Status: DC
  Administered 2013-08-25: 2 via ORAL
  Filled 2013-08-25 (×2): qty 2

## 2013-08-25 MED ORDER — SODIUM CHLORIDE 0.9 % IV SOLN: INTRAVENOUS | Status: DC

## 2013-08-25 NOTE — Progress Notes (Signed)
Patient ID: [name], female   DOB: June 25, 1955, 58 y.o.   MRN: [id number] Patient states that she is more comfortable this morning. Patient states that she wants to use inpatient rehabilitation for 5 days. I feel patient would require rehabilitation prior to discharge to home.

## 2013-08-25 NOTE — Progress Notes (Signed)
Rehab admissions - I spoke with Dr. Sarajane Jews and he says he needs GI consult, so will hold on inpatient rehab admission for today.  Will check in am to see if she is medically ready.  Call me for questions.  [phone]

## 2013-08-25 NOTE — Discharge Summary (Signed)
Physician Discharge Summary  [name] [id number] DOB: March 21, 1955 DOA: [id number]  PCP: Tonya Czar, MD  Patient left AGAINST MEDICAL ADVICE  Admit date: 08/21/2013 Discharge date: 08/25/2013   Follow-up Information   Follow up with [name],Tonya V, MD In 2 weeks.   Specialty:  Orthopedic Surgery   Contact information:   [place] [address] [phone]      Discharge Diagnoses:  1. Osteomyelitis left lower extremity status post transtibial amputation 6/80 2. Possible acute encephalopathy 3. Anemia of chronic disease 4. Acute renal failure 5. Diabetes mellitus uncontrolled with history of neuropathy, peripheral vascular disease, Charcot foot 6. Elevated ALT 7. Anxiety, depression  History of present illness:  58 year old woman with history of diabetes as well as peripheral vascular disease who presented with draining left foot wound that had been followed by wound care clinic as an outpatient. She developed fever and confusion he was admitted for osteomyelitis of the left lower extremity, anemia requiring transfusion.  Hospital Course:  Patient was seen by orthopedics, underwent transtibial amputation without complication. Source control was achieved and per infectious disease conversation, additional antibiotics could be considered for up to one week, however this was not mandatory. She is noted to have significant anemia and GI was consulted for further evaluation however patient ended up refusing endoscopy. She is also noted to have acute renal failure possibly related to vancomycin.  Plans were to have further investigation of her significant anemia upper GI, administrative fluids, recheck basic metabolic panel in the morning to assess kidney function and repeat CBC in the morning after transfusion. After this was accomplished, rehabilitation was anticipated.  However, the patient and family decided to leave today. I explained the rationale for  further hospitalization and explained I would not advise discharge at this time. However family and patient decided to go home. Fortunately she does not require further antibiotics.  Discharge Instructions    The results of significant diagnostics from this hospitalization (including imaging, microbiology, ancillary and laboratory) are listed below for reference.    Significant Diagnostic Studies: Dg Tibia/fibula Left  08/21/2013   CLINICAL DATA:  Swelling and open sores in the distal lower leg. Rule out osteomyelitis.  EXAM: LEFT TIBIA AND FIBULA - 2 VIEW  COMPARISON:  05/06/2013  FINDINGS: Since the previous study, there has been interval placement of intra medullary rod and screw fixation of the distal tibia through to the calcaneus. There is prominent productive bone around the tibia and tibia fibular joint with bone nonunion mildly displaced distal fibular fracture. There has been significant bone bruise or shown in the talus and upper calcaneus. Loss of distinction of cortical surfaces at the margin of the eroded bone. Diffuse soft tissue swelling. Findings likely to represent osteomyelitis.  IMPRESSION: Postoperative changes with intra medullary rod and screw fixation from the lower tibia through to the calcaneus. Prominent bone erosion of the distal tibia, talus, and upper calcaneus with productive bone in the lower tibia fibular region. Changes likely represent osteomyelitis.   Electronically Signed   By: Lucienne Capers M.D.   On: 08/21/2013 22:12   Dg Foot 2 Views Left  08/21/2013   CLINICAL DATA:  Swelling and open sores in the distal lower leg to rule out osteomyelitis.  EXAM: LEFT FOOT - 2 VIEW  COMPARISON:  Left foot 05/13/2009  FINDINGS: Since the previous study, there has been resection or resorption of the left second, third, and fourth toes at the metatarsal phalangeal region. There is bone resection or  resorption involving the distal aspect of the remaining second and fourth  metatarsal bones. Diffuse soft tissue swelling. Changes are nonspecific without interval comparison studies and could represent postoperative change or changes due to osteomyelitis. Diffuse bone demineralization. Cortical sclerosis along the mid shafts of the metatarsal bones. Prominent hallux valgus deformity with prominent degenerative changes in the first metatarsal phalangeal joint.  IMPRESSION: Resection or resorption of the left second, third, and fourth toes with postoperative or erosive changes involving the metatarsal heads of second and fourth rays. Changes could represent postoperative change or osteomyelitis.   Electronically Signed   By: Lucienne Capers M.D.   On: 08/21/2013 22:14    Microbiology: Recent Results (from the past 240 hour(s))  CULTURE, BLOOD (ROUTINE X 2)     Status: None   Collection Time    08/21/13  8:10 PM      Result Value Ref Range Status   Specimen Description BLOOD RIGHT ARM   Final   Special Requests BOTTLES DRAWN AEROBIC AND ANAEROBIC 10CC EA   Final   Culture  Setup Time     Final   Value: 08/22/2013 01:52     Performed at [facility]   Culture     Final   Value:        BLOOD CULTURE RECEIVED NO GROWTH TO DATE CULTURE WILL BE HELD FOR 5 DAYS BEFORE ISSUING A FINAL NEGATIVE REPORT     Performed at [facility]   Report Status PENDING   Incomplete  CULTURE, BLOOD (ROUTINE X 2)     Status: None   Collection Time    08/21/13  8:20 PM      Result Value Ref Range Status   Specimen Description BLOOD RIGHT HAND   Final   Special Requests BOTTLES DRAWN AEROBIC ONLY 10CC   Final   Culture  Setup Time     Final   Value: 08/22/2013 01:52     Performed at [facility]   Culture     Final   Value:        BLOOD CULTURE RECEIVED NO GROWTH TO DATE CULTURE WILL BE HELD FOR 5 DAYS BEFORE ISSUING A FINAL NEGATIVE REPORT     Performed at [facility]   Report Status PENDING   Incomplete  MRSA PCR SCREENING     Status: Abnormal    Collection Time    08/22/13  4:49 AM      Result Value Ref Range Status   MRSA by PCR POSITIVE (*) NEGATIVE Final   Comment:            The GeneXpert MRSA Assay (FDA     approved for NASAL specimens     only), is one component of a     comprehensive MRSA colonization     surveillance program. It is not     intended to diagnose MRSA     infection nor to guide or     monitor treatment for     MRSA infections.     RESULT CALLED TO, READ BACK BY AND VERIFIED WITH:     [facility] RN [id number] AT [id number] SKEEN,P     Labs: Basic Metabolic Panel:  Recent Labs Lab 08/21/13 1724 08/21/13 2027 08/22/13 0514 08/25/13 0555  NA 130* 129* 133* 137  K 4.2 4.3 3.9 4.6  CL 91* 92* 96 99  CO2  --  22 23 19   GLUCOSE 360* 252* 256* 104*  BUN 8 10 9  13  CREATININE 0.90 0.89 0.87 1.59*  CALCIUM  --  8.7 8.9 8.8   Liver Function Tests:  Recent Labs Lab 08/22/13 0514 08/25/13 0555  AST 28 33  ALT 99* 55*  ALKPHOS 186* 152*  BILITOT 1.2 0.4  PROT 6.7 6.7  ALBUMIN 2.3* 2.0*   CBC:  Recent Labs Lab 08/21/13 1724 08/21/13 2027 08/22/13 0514 08/23/13 1330 08/25/13 0555  WBC  --  11.3* 9.1 7.3 6.9  NEUTROABS  --  9.2* 7.6  --   --   HGB 7.5* 6.3* 7.3* 7.3* 6.8*  HCT 22.0* 20.8* 23.9* 23.7* 22.9*  MCV  --  77.9* 77.9* 80.3 80.1  PLT  --  366 336 341 353   CBG:  Recent Labs Lab 08/24/13 1713 08/24/13 2310 08/25/13 0519 08/25/13 0908 08/25/13 1123  GLUCAP 162* 157* 91 121* 112*    Principal Problem:   Osteomyelitis Active Problems:   DIABETES MELLITUS   ANXIETY DEPRESSION   CAD, ARTERY BYPASS GRAFT   CAROTID ENDARTERECTOMY, RIGHT, HX OF   PVD (peripheral vascular disease)   Charcot foot due to diabetes mellitus   Time: 35 minutes  Signed:  Murray Hodgkins, MD [facility] 08/25/2013, 8:10 PM

## 2013-08-25 NOTE — Progress Notes (Signed)
PROGRESS NOTE  [name] [id number] DOB: January 06, 1956 DOA: [id number] PCP: [name], MD  Summary: 58 year old woman with history of diabetes as well as peripheral vascular disease who presented with draining left foot wound that had been followed by wound care clinic as an outpatient. She developed fever and confusion he was admitted for osteomyelitis of the left lower extremity, anemia requiring transfusion  Assessment/Plan: 1. Osteomyelitis left lower extremity (distal tibia, talus,  calcaneus) status post transtibial amputation 6/8. Discussed with infectious disease Dr. Baxter Flattery, as source control achieved, recommends total 7-10 days oral antibiotics as outpatient. 2. Possible acute encephalopathy. Likely secondary to narcotics. Agree discontinue IV narcotics. 3. Anemia. Anemia of chronic disease, review of records demonstrates multiple episodes of low Hgb. Ferritin normal this admission, laboratory studies in the past have suggested iron deficiency. Suspect anemia of chronic disease with superimposed acute blood loss anemia. Has never had a GI evaluation. 4. Acute renal failure. Etiology unclear, possibly related to vancomycin. 5. Diabetes mellitus uncontrolled with history of neuropathy, peripheral vascular disease, Charcot foot. Well-controlled  Hold metformin while hospitalized. Hemoglobin A1c 7.3 6. Hyponatremia. Resolved. 7. Elevated ALT trending downwards. Significance unclear. This is been seen in the past. Outpatient followup to resolution recommended. 8. H/o Charcot foot with fusion.  9. H/o PVD, peripheral neuropathy, CABG 10. Anxiety, depression. Stable, continue Zoloft.   Continue postsurgical care. Rehabilitation planned.  Change to oral abx for further 7 days  CBC in the morning, Transfusion one unit packed red blood cells. Will ask for GI evaluation inpatient vs. Outpatient.  IV fluids, discontinue vancomycin, recheck basic metabolic panel the morning.  Code  Status: full code DVT prophylaxis: TED hose (no heparin with anemia) Family Communication: discussed with 2 sons at bedside Disposition Plan: pending, likely ready for d/c next 24 hours  Murray Hodgkins, MD  [facility]  Pager [phone] If 7PM-7AM, please contact night-coverage at [email or web address], password [facility] 08/25/2013, 9:25 AM  LOS: 4 days   Consultants:  Orthopedics  Inpatient rehabilitation  Physical therapy home health PT, 3 in 1  Procedures:  2 units packed red blood cell transfusion on admission  6/8 left transtibial amputation  1 unit packed red blood cells 6/11  Antibiotics: Vanco 08/21/2013 --> 6/11 Zosyn 08/21/2013 --> 6/11 Bactrim 6/11 >> 6/17 Cipro 6/11 >> 6/17  HPI/Subjective: Family reports she was still somewhat confused this morning. Patient denies complaints. No chest pain, shortness of breath, nausea or vomiting.  Objective: Filed Vitals:   08/24/13 1112 08/24/13 1500 08/24/13 2330 08/25/13 0529  BP: 131/73 109/89 148/62 155/115  Pulse:  88 83 91  Temp:  100 F (37.8 C) 98.3 F (36.8 C) 98.6 F (37 C)  TempSrc:   Oral Oral  Resp:  18 18 20   Height:      Weight:      SpO2:  100% 100% 96%    Intake/Output Summary (Last 24 hours) at 08/25/13 0925 Last data filed at 08/25/13 0610  Gross per 24 hour  Intake 2671.67 ml  Output      0 ml  Net 2671.67 ml     Filed Weights   08/21/13 1653  Weight: 77.111 kg (170 lb)    Exam:   Afebrile, vital signs are stable. No hypoxia. Gen. Appears calm and comfortable. Psych. Grossly normal mood and affect. Speech fluent and appropriate. Oriented to self, location, month, year. Cardiovascular. Regular rate and rhythm. No murmur, rub or gallop. No lower extremity edema. Respiratory. Clear to auscultation bilaterally. No wheezes, rales or  rhonchi. Normal respiratory effort. Abdomen. Soft, nontender, nondistended Neurologic. Grossly nonfocal.  Data Reviewed: I/O: Multiple voids Chemistry:  Capillary blood sugars stable. Creatinine 1.59 above baseline. ALT improved. Heme:  Hemoglobin 6.8 ID: Blood cultures no growth to date  Scheduled Meds: . aspirin EC  81 mg Oral Daily  . atorvastatin  80 mg Oral Daily  . Chlorhexidine Gluconate Cloth  6 each Topical Q0600  . clopidogrel  75 mg Oral Q breakfast  . docusate sodium  100 mg Oral BID  . famotidine  20 mg Oral BID  . insulin aspart  0-15 Units Subcutaneous Q6H  . insulin aspart  6 Units Subcutaneous TID WC  . insulin detemir  15 Units Subcutaneous QHS  . metoprolol tartrate  25 mg Oral BID  . mupirocin ointment  1 application Nasal BID  . piperacillin-tazobactam (ZOSYN)  IV  3.375 g Intravenous 3 times per day  . sertraline  100 mg Oral BID  . sodium chloride  3 mL Intravenous Q12H  . vancomycin  1,000 mg Intravenous Q12H   Continuous Infusions:   Principal Problem:   Osteomyelitis Active Problems:   DIABETES MELLITUS   ANXIETY DEPRESSION   CAD, ARTERY BYPASS GRAFT   CAROTID ENDARTERECTOMY, RIGHT, HX OF   PVD (peripheral vascular disease)   Charcot foot due to diabetes mellitus   Time spent 25 minutes

## 2013-08-25 NOTE — Progress Notes (Addendum)
Pt and family decided to take pt home.Refused colonoscopy Dr Benson Norway in spoke with family refused.Dr Sarajane Jews he spoke with son advised against leaving pt and husband still wanted to leave signed out AMA.Pt left in wc prosthesis on rt stump lt stump DDI Pt alert oriented at time happy.

## 2013-08-25 NOTE — Consult Note (Signed)
Unassigned Consult  Reason for Consult: Anemia Referring Physician: [facility]  [name] HPI: This is a 58 year old female with a PMH of osteomyelitis of the left leg s/p amputation, s/p right BKA, neuropathy, PVD, HTN, DM, and CAD s/p CABG originally admitted for drainage in her foot.  Over the interval time period she was noted to be anemia.  Her HGB is currently in the 6 range.  Now that her other medical issues are under better control GI was consulted for further evaluation of her anemia.  She states having an issue with anemia in the past and four years ago a local GI in [place], where she resides, performed an EGD/Colonoscopy.  Per her report, those procedures were normal.  She is not interested in undergoing any repeat procedure at this time.    Past Medical History  Diagnosis Date  . Hypertension   . Neuropathy   . Peripheral vascular disease   . Coronary artery disease   . Anxiety   . Depression   . Anemia   . Type II diabetes mellitus   . High cholesterol   . History of blood transfusion     "one time; related to diabetes I guess"   . Arthritis     "hands"  . GERD (gastroesophageal reflux disease)     takes Zantac  . Complication of anesthesia     pt felt like she had a hard time waking up after surgery in Feb. 2015.    Past Surgical History  Procedure Laterality Date  . Toe amputation Right     "took off a couple toes before the amputation"  . Below knee leg amputation Right Jan. 2008  . Carotid endarterectomy Right   . Cholecystectomy    . Ankle fusion Left 05/11/2013    TIBIOCALCANEAL FUSION   . Tonsillectomy    . Dilation and curettage of uterus    . Coronary artery bypass graft  12/01/2007    "CABG X4" (05/12/2013)  . Ankle fusion Left 05/12/2013    Procedure: LEFT TIBIOCALCANEAL FUSION;  Surgeon: Newt Minion, MD;  Location: [place];  Service: Orthopedics;  Laterality: Left;  Left Tibiocalcaneal Fusion  . Colonoscopy    . Irrigation and  debridement abscess Left 06/17/2013    ANKLE           DR [name]  . I&d extremity Left 06/17/2013    Procedure: IRRIGATION AND DEBRIDEMENT EXTREMITY, PLACEMENT ANTIBIOTIC STIMULAN BEADS;  Surgeon: Newt Minion, MD;  Location: [place];  Service: Orthopedics;  Laterality: Left;  . Application of wound vac  06/17/2013    Procedure: APPLICATION OF WOUND VAC;  Surgeon: Newt Minion, MD;  Location: [place];  Service: Orthopedics;;  . Amputation Left 08/22/2013    Procedure: LEFT BELOW KNEE AMPUTATION;  Surgeon: Newt Minion, MD;  Location: [place];  Service: Orthopedics;  Laterality: Left;    Family History  Problem Relation Age of Onset  . Cancer Mother   . Heart disease Father   . Diabetes Father   . Hyperlipidemia Father   . Hypertension Father     Social History:  reports that she has never smoked. She has never used smokeless tobacco. She reports that she does not drink alcohol or use illicit drugs.  Allergies:  Allergies  Allergen Reactions  . Tape Rash    Redness, rash, itchiness    Medications:  Scheduled: . aspirin EC  81 mg Oral Daily  . atorvastatin  80 mg Oral Daily  . Chlorhexidine Gluconate Cloth  6 each Topical Q0600  . ciprofloxacin  500 mg Oral BID  . clopidogrel  75 mg Oral Q breakfast  . docusate sodium  100 mg Oral BID  . famotidine  20 mg Oral Daily  . insulin aspart  0-15 Units Subcutaneous Q6H  . insulin aspart  6 Units Subcutaneous TID WC  . insulin detemir  15 Units Subcutaneous QHS  . metoprolol tartrate  25 mg Oral BID  . mupirocin ointment  1 application Nasal BID  . sertraline  100 mg Oral BID  . sodium chloride  3 mL Intravenous Q12H  . sulfamethoxazole-trimethoprim  2 tablet Oral Q12H   Continuous:   Results for orders placed during the hospital encounter of 08/21/13 (from the past 24 hour(s))  GLUCOSE, CAPILLARY     Status: Abnormal   Collection Time    08/24/13  5:13 PM      Result Value Ref Range   Glucose-Capillary 162 (*) 70 - 99 mg/dL  GLUCOSE,  CAPILLARY     Status: Abnormal   Collection Time    08/24/13 11:10 PM      Result Value Ref Range   Glucose-Capillary 157 (*) 70 - 99 mg/dL  GLUCOSE, CAPILLARY     Status: None   Collection Time    08/25/13  5:19 AM      Result Value Ref Range   Glucose-Capillary 91  70 - 99 mg/dL   Comment 1 Notify RN    CBC     Status: Abnormal   Collection Time    08/25/13  5:55 AM      Result Value Ref Range   WBC 6.9  4.0 - 10.5 K/uL   RBC 2.86 (*) 3.87 - 5.11 MIL/uL   Hemoglobin 6.8 (*) 12.0 - 15.0 g/dL   HCT 22.9 (*) 36.0 - 46.0 %   MCV 80.1  78.0 - 100.0 fL   MCH 23.8 (*) 26.0 - 34.0 pg   MCHC 29.7 (*) 30.0 - 36.0 g/dL   RDW 19.0 (*) 11.5 - 15.5 %   Platelets 353  150 - 400 K/uL  COMPREHENSIVE METABOLIC PANEL     Status: Abnormal   Collection Time    08/25/13  5:55 AM      Result Value Ref Range   Sodium 137  137 - 147 mEq/L   Potassium 4.6  3.7 - 5.3 mEq/L   Chloride 99  96 - 112 mEq/L   CO2 19  19 - 32 mEq/L   Glucose, Bld 104 (*) 70 - 99 mg/dL   BUN 13  6 - 23 mg/dL   Creatinine, Ser 1.59 (*) 0.50 - 1.10 mg/dL   Calcium 8.8  8.4 - 10.5 mg/dL   Total Protein 6.7  6.0 - 8.3 g/dL   Albumin 2.0 (*) 3.5 - 5.2 g/dL   AST 33  0 - 37 U/L   ALT 55 (*) 0 - 35 U/L   Alkaline Phosphatase 152 (*) 39 - 117 U/L   Total Bilirubin 0.4  0.3 - 1.2 mg/dL   GFR calc non Af Amer 35 (*) >90 mL/min   GFR calc Af Amer 41 (*) >90 mL/min  GLUCOSE, CAPILLARY     Status: Abnormal   Collection Time    08/25/13  9:08 AM      Result Value Ref Range   Glucose-Capillary 121 (*) 70 - 99 mg/dL  PREPARE RBC (CROSSMATCH)     Status: None  Collection Time    08/25/13  9:10 AM      Result Value Ref Range   Order Confirmation ORDER PROCESSED BY BLOOD BANK    TYPE AND SCREEN     Status: None   Collection Time    08/25/13  9:10 AM      Result Value Ref Range   ABO/RH(D) A POS     Antibody Screen NEG     Sample Expiration 08/28/2013     Unit Number [id number]     Blood Component Type RED CELLS,LR      Unit division 00     Status of Unit ISSUED     Transfusion Status OK TO TRANSFUSE     Crossmatch Result Compatible    GLUCOSE, CAPILLARY     Status: Abnormal   Collection Time    08/25/13 11:23 AM      Result Value Ref Range   Glucose-Capillary 112 (*) 70 - 99 mg/dL   Comment 1 Documented in Chart     Comment 2 Notify RN       No results found.  ROS:  As stated above in the HPI otherwise negative.  Blood pressure 156/74, pulse 96, temperature 98.9 F (37.2 C), temperature source Oral, resp. rate 16, height 5\' 5"  (1.651 m), weight 170 lb (77.111 kg), SpO2 96.00%.    PE: Gen: NAD, Alert and Oriented HEENT:  [place]/AT, EOMI Neck: Supple, no LAD Lungs: CTA Bilaterally CV: RRR without M/G/R ABM: Soft, NTND, +BS, obese Ext: Bilateral lower extremity ampulations  Assessment/Plan: 1) Anemia. 2) PVD. 3) CAD. 4) DM.   [name] stable.  She does not want the procedures and she prefers to follow up with her physicians in [facility].  I think that is fine.  No immediate rush for any intervention.  It appears that she has a good network of physicians that she sees at home.  Plan: 1) No further GI intevention. 2) Follow up with home physicians. 3) Signing off.  Plan: 1) EGD/Colonoscopy tomorrow.  Piera Downs D 08/25/2013, 12:54 PM

## 2013-08-26 ENCOUNTER — Encounter (HOSPITAL_COMMUNITY): Payer: Self-pay

## 2013-08-26 ENCOUNTER — Ambulatory Visit (HOSPITAL_COMMUNITY): Admit: 2013-08-26 | Payer: Self-pay | Admitting: Gastroenterology

## 2013-08-26 ENCOUNTER — Encounter (HOSPITAL_BASED_OUTPATIENT_CLINIC_OR_DEPARTMENT_OTHER): Payer: Medicare Other | Attending: General Surgery

## 2013-08-26 LAB — TYPE AND SCREEN
ABO/RH(D): A POS
Antibody Screen: NEGATIVE
Unit division: 0

## 2013-08-26 SURGERY — COLONOSCOPY
Anesthesia: Moderate Sedation

## 2013-08-28 ENCOUNTER — Inpatient Hospital Stay (HOSPITAL_COMMUNITY)
Admission: EM | Admit: 2013-08-28 | Discharge: 2013-09-01 | DRG: 100 | Disposition: A | Discharge status: Home / Self Care | Payer: Medicare Other | Attending: Internal Medicine | Admitting: Internal Medicine

## 2013-08-28 DIAGNOSIS — E876 Hypokalemia: Secondary | ICD-10-CM | POA: Diagnosis present

## 2013-08-28 DIAGNOSIS — E11610 Type 2 diabetes mellitus with diabetic neuropathic arthropathy: Secondary | ICD-10-CM

## 2013-08-28 DIAGNOSIS — L98499 Non-pressure chronic ulcer of skin of other sites with unspecified severity: Secondary | ICD-10-CM

## 2013-08-28 DIAGNOSIS — M869 Osteomyelitis, unspecified: Secondary | ICD-10-CM

## 2013-08-28 DIAGNOSIS — G936 Cerebral edema: Secondary | ICD-10-CM | POA: Diagnosis present

## 2013-08-28 DIAGNOSIS — K219 Gastro-esophageal reflux disease without esophagitis: Secondary | ICD-10-CM | POA: Diagnosis present

## 2013-08-28 DIAGNOSIS — R569 Unspecified convulsions: Principal | ICD-10-CM

## 2013-08-28 DIAGNOSIS — I6529 Occlusion and stenosis of unspecified carotid artery: Secondary | ICD-10-CM

## 2013-08-28 DIAGNOSIS — I70219 Atherosclerosis of native arteries of extremities with intermittent claudication, unspecified extremity: Secondary | ICD-10-CM

## 2013-08-28 DIAGNOSIS — Z79899 Other long term (current) drug therapy: Secondary | ICD-10-CM

## 2013-08-28 DIAGNOSIS — E7800 Pure hypercholesterolemia, unspecified: Secondary | ICD-10-CM | POA: Diagnosis present

## 2013-08-28 DIAGNOSIS — Z981 Arthrodesis status: Secondary | ICD-10-CM

## 2013-08-28 DIAGNOSIS — Z951 Presence of aortocoronary bypass graft: Secondary | ICD-10-CM

## 2013-08-28 DIAGNOSIS — Z794 Long term (current) use of insulin: Secondary | ICD-10-CM

## 2013-08-28 DIAGNOSIS — I2510 Atherosclerotic heart disease of native coronary artery without angina pectoris: Secondary | ICD-10-CM | POA: Diagnosis present

## 2013-08-28 DIAGNOSIS — Z9889 Other specified postprocedural states: Secondary | ICD-10-CM

## 2013-08-28 DIAGNOSIS — Z22322 Carrier or suspected carrier of Methicillin resistant Staphylococcus aureus: Secondary | ICD-10-CM

## 2013-08-28 DIAGNOSIS — R000 Tachycardia, unspecified: Secondary | ICD-10-CM | POA: Diagnosis present

## 2013-08-28 DIAGNOSIS — E43 Unspecified severe protein-calorie malnutrition: Secondary | ICD-10-CM

## 2013-08-28 DIAGNOSIS — E119 Type 2 diabetes mellitus without complications: Secondary | ICD-10-CM

## 2013-08-28 DIAGNOSIS — Z833 Family history of diabetes mellitus: Secondary | ICD-10-CM

## 2013-08-28 DIAGNOSIS — R0989 Other specified symptoms and signs involving the circulatory and respiratory systems: Secondary | ICD-10-CM

## 2013-08-28 DIAGNOSIS — I25810 Atherosclerosis of coronary artery bypass graft(s) without angina pectoris: Secondary | ICD-10-CM

## 2013-08-28 DIAGNOSIS — M129 Arthropathy, unspecified: Secondary | ICD-10-CM | POA: Diagnosis present

## 2013-08-28 DIAGNOSIS — F341 Dysthymic disorder: Secondary | ICD-10-CM

## 2013-08-28 DIAGNOSIS — I10 Essential (primary) hypertension: Secondary | ICD-10-CM

## 2013-08-28 DIAGNOSIS — D638 Anemia in other chronic diseases classified elsewhere: Secondary | ICD-10-CM

## 2013-08-28 DIAGNOSIS — F3289 Other specified depressive episodes: Secondary | ICD-10-CM | POA: Diagnosis present

## 2013-08-28 DIAGNOSIS — I739 Peripheral vascular disease, unspecified: Secondary | ICD-10-CM

## 2013-08-28 DIAGNOSIS — Z8669 Personal history of other diseases of the nervous system and sense organs: Secondary | ICD-10-CM

## 2013-08-28 DIAGNOSIS — G589 Mononeuropathy, unspecified: Secondary | ICD-10-CM | POA: Diagnosis present

## 2013-08-28 DIAGNOSIS — E1169 Type 2 diabetes mellitus with other specified complication: Secondary | ICD-10-CM

## 2013-08-28 DIAGNOSIS — Z8249 Family history of ischemic heart disease and other diseases of the circulatory system: Secondary | ICD-10-CM

## 2013-08-28 DIAGNOSIS — S98139A Complete traumatic amputation of one unspecified lesser toe, initial encounter: Secondary | ICD-10-CM

## 2013-08-28 DIAGNOSIS — I161 Hypertensive emergency: Secondary | ICD-10-CM

## 2013-08-28 DIAGNOSIS — S88119A Complete traumatic amputation at level between knee and ankle, unspecified lower leg, initial encounter: Secondary | ICD-10-CM

## 2013-08-28 DIAGNOSIS — Z7982 Long term (current) use of aspirin: Secondary | ICD-10-CM

## 2013-08-28 DIAGNOSIS — Z9089 Acquired absence of other organs: Secondary | ICD-10-CM

## 2013-08-28 DIAGNOSIS — E785 Hyperlipidemia, unspecified: Secondary | ICD-10-CM

## 2013-08-28 DIAGNOSIS — E1141 Type 2 diabetes mellitus with diabetic mononeuropathy: Secondary | ICD-10-CM | POA: Diagnosis present

## 2013-08-28 DIAGNOSIS — F329 Major depressive disorder, single episode, unspecified: Secondary | ICD-10-CM | POA: Diagnosis present

## 2013-08-28 DIAGNOSIS — F411 Generalized anxiety disorder: Secondary | ICD-10-CM | POA: Diagnosis present

## 2013-08-28 DIAGNOSIS — E118 Type 2 diabetes mellitus with unspecified complications: Secondary | ICD-10-CM

## 2013-08-28 DIAGNOSIS — IMO0002 Reserved for concepts with insufficient information to code with codable children: Secondary | ICD-10-CM | POA: Diagnosis present

## 2013-08-28 DIAGNOSIS — E1165 Type 2 diabetes mellitus with hyperglycemia: Secondary | ICD-10-CM | POA: Diagnosis present

## 2013-08-28 DIAGNOSIS — M86172 Other acute osteomyelitis, left ankle and foot: Secondary | ICD-10-CM

## 2013-08-28 HISTORY — DX: Heart failure, unspecified: I50.9

## 2013-08-28 LAB — CULTURE, BLOOD (ROUTINE X 2)
Culture: NO GROWTH
Culture: NO GROWTH

## 2013-08-29 ENCOUNTER — Inpatient Hospital Stay (HOSPITAL_COMMUNITY): Payer: Medicare Other

## 2013-08-29 ENCOUNTER — Encounter (HOSPITAL_COMMUNITY): Payer: Self-pay | Admitting: Emergency Medicine

## 2013-08-29 ENCOUNTER — Emergency Department (HOSPITAL_COMMUNITY): Payer: Medicare Other

## 2013-08-29 DIAGNOSIS — I10 Essential (primary) hypertension: Secondary | ICD-10-CM

## 2013-08-29 DIAGNOSIS — E119 Type 2 diabetes mellitus without complications: Secondary | ICD-10-CM

## 2013-08-29 DIAGNOSIS — R569 Unspecified convulsions: Principal | ICD-10-CM

## 2013-08-29 DIAGNOSIS — D638 Anemia in other chronic diseases classified elsewhere: Secondary | ICD-10-CM

## 2013-08-29 LAB — CBC WITH DIFFERENTIAL/PLATELET
BASOS ABS: 0 10*3/uL (ref 0.0–0.1)
BASOS PCT: 0 % (ref 0–1)
BASOS PCT: 0 % (ref 0–1)
Basophils Absolute: 0 10*3/uL (ref 0.0–0.1)
Eosinophils Absolute: 0.1 10*3/uL (ref 0.0–0.7)
Eosinophils Absolute: 0.1 10*3/uL (ref 0.0–0.7)
Eosinophils Relative: 1 % (ref 0–5)
Eosinophils Relative: 2 % (ref 0–5)
HCT: 26.5 % — ABNORMAL LOW (ref 36.0–46.0)
HEMATOCRIT: 28.3 % — AB (ref 36.0–46.0)
HEMOGLOBIN: 8.8 g/dL — AB (ref 12.0–15.0)
Hemoglobin: 8.1 g/dL — ABNORMAL LOW (ref 12.0–15.0)
Lymphocytes Relative: 7 % — ABNORMAL LOW (ref 12–46)
Lymphocytes Relative: 17 % (ref 12–46)
Lymphs Abs: 0.5 10*3/uL — ABNORMAL LOW (ref 0.7–4.0)
Lymphs Abs: 1 10*3/uL (ref 0.7–4.0)
MCH: 24.7 pg — ABNORMAL LOW (ref 26.0–34.0)
MCH: 24.3 pg — ABNORMAL LOW (ref 26.0–34.0)
MCHC: 31.1 g/dL (ref 30.0–36.0)
MCHC: 30.6 g/dL (ref 30.0–36.0)
MCV: 79.5 fL (ref 78.0–100.0)
MCV: 79.3 fL (ref 78.0–100.0)
MONO ABS: 0.7 10*3/uL (ref 0.1–1.0)
Monocytes Absolute: 0.5 10*3/uL (ref 0.1–1.0)
Monocytes Relative: 9 % (ref 3–12)
Monocytes Relative: 9 % (ref 3–12)
NEUTROS ABS: 6.8 10*3/uL (ref 1.7–7.7)
NEUTROS PCT: 72 % (ref 43–77)
Neutro Abs: 4.2 10*3/uL (ref 1.7–7.7)
Neutrophils Relative %: 83 % — ABNORMAL HIGH (ref 43–77)
Platelets: 382 10*3/uL (ref 150–400)
Platelets: 369 10*3/uL (ref 150–400)
RBC: 3.56 MIL/uL — ABNORMAL LOW (ref 3.87–5.11)
RBC: 3.34 MIL/uL — ABNORMAL LOW (ref 3.87–5.11)
RDW: 18.1 % — ABNORMAL HIGH (ref 11.5–15.5)
RDW: 18.4 % — AB (ref 11.5–15.5)
WBC: 8.1 10*3/uL (ref 4.0–10.5)
WBC: 5.8 10*3/uL (ref 4.0–10.5)

## 2013-08-29 LAB — COMPREHENSIVE METABOLIC PANEL
ALK PHOS: 125 U/L — AB (ref 39–117)
ALT: 29 U/L (ref 0–35)
ALT: 25 U/L (ref 0–35)
AST: 22 U/L (ref 0–37)
AST: 18 U/L (ref 0–37)
Albumin: 2.3 g/dL — ABNORMAL LOW (ref 3.5–5.2)
Albumin: 2 g/dL — ABNORMAL LOW (ref 3.5–5.2)
Alkaline Phosphatase: 138 U/L — ABNORMAL HIGH (ref 39–117)
BILIRUBIN TOTAL: 0.6 mg/dL (ref 0.3–1.2)
BUN: 9 mg/dL (ref 6–23)
BUN: 8 mg/dL (ref 6–23)
CALCIUM: 8.6 mg/dL (ref 8.4–10.5)
CHLORIDE: 96 meq/L (ref 96–112)
CO2: 25 meq/L (ref 19–32)
CO2: 24 mEq/L (ref 19–32)
CREATININE: 1.17 mg/dL — AB (ref 0.50–1.10)
Calcium: 8.8 mg/dL (ref 8.4–10.5)
Chloride: 100 mEq/L (ref 96–112)
Creatinine, Ser: 1.14 mg/dL — ABNORMAL HIGH (ref 0.50–1.10)
GFR calc Af Amer: 59 mL/min — ABNORMAL LOW (ref >90)
GFR calc non Af Amer: 53 mL/min — ABNORMAL LOW (ref >90)
GFR, EST AFRICAN AMERICAN: 61 mL/min — AB (ref >90)
GFR, EST NON AFRICAN AMERICAN: 51 mL/min — AB (ref >90)
GLUCOSE: 260 mg/dL — AB (ref 70–99)
Glucose, Bld: 303 mg/dL — ABNORMAL HIGH (ref 70–99)
POTASSIUM: 3.4 meq/L — AB (ref 3.7–5.3)
Potassium: 3.2 mEq/L — ABNORMAL LOW (ref 3.7–5.3)
Sodium: 137 mEq/L (ref 137–147)
Sodium: 138 mEq/L (ref 137–147)
Total Bilirubin: 0.4 mg/dL (ref 0.3–1.2)
Total Protein: 7.2 g/dL (ref 6.0–8.3)
Total Protein: 6.6 g/dL (ref 6.0–8.3)

## 2013-08-29 LAB — I-STAT VENOUS BLOOD GAS, ED
Acid-Base Excess: 4 mmol/L — ABNORMAL HIGH (ref 0.0–2.0)
Bicarbonate: 27 mEq/L — ABNORMAL HIGH (ref 20.0–24.0)
O2 SAT: 95 %
TCO2: 28 mmol/L (ref 0–100)
pCO2, Ven: 34.5 mmHg — ABNORMAL LOW (ref 45.0–50.0)
pH, Ven: 7.502 — ABNORMAL HIGH (ref 7.250–7.300)
pO2, Ven: 70 mmHg — ABNORMAL HIGH (ref 30.0–45.0)

## 2013-08-29 LAB — HEMOGLOBIN A1C
Hgb A1c MFr Bld: 7.3 % — ABNORMAL HIGH (ref <5.7)
Mean Plasma Glucose: 163 mg/dL — ABNORMAL HIGH (ref <117)

## 2013-08-29 LAB — GLUCOSE, CAPILLARY
GLUCOSE-CAPILLARY: 237 mg/dL — AB (ref 70–99)
GLUCOSE-CAPILLARY: 163 mg/dL — AB (ref 70–99)
GLUCOSE-CAPILLARY: 162 mg/dL — AB (ref 70–99)
GLUCOSE-CAPILLARY: 219 mg/dL — AB (ref 70–99)
Glucose-Capillary: 280 mg/dL — ABNORMAL HIGH (ref 70–99)
Glucose-Capillary: 172 mg/dL — ABNORMAL HIGH (ref 70–99)

## 2013-08-29 LAB — RAPID URINE DRUG SCREEN, HOSP PERFORMED
Amphetamines: NOT DETECTED
BARBITURATES: NOT DETECTED
Benzodiazepines: NOT DETECTED
COCAINE: NOT DETECTED
Opiates: NOT DETECTED
TETRAHYDROCANNABINOL: NOT DETECTED

## 2013-08-29 LAB — MRSA PCR SCREENING: MRSA by PCR: NEGATIVE

## 2013-08-29 LAB — PHOSPHORUS: Phosphorus: 3.7 mg/dL (ref 2.3–4.6)

## 2013-08-29 LAB — TSH: TSH: 2.57 u[IU]/mL (ref 0.350–4.500)

## 2013-08-29 LAB — MAGNESIUM: Magnesium: 1.2 mg/dL — ABNORMAL LOW (ref 1.5–2.5)

## 2013-08-29 LAB — I-STAT CG4 LACTIC ACID, ED: Lactic Acid, Venous: 0.73 mmol/L (ref 0.5–2.2)

## 2013-08-29 LAB — T4, FREE: Free T4: 1.5 ng/dL (ref 0.80–1.80)

## 2013-08-29 LAB — CBG MONITORING, ED: Glucose-Capillary: 264 mg/dL — ABNORMAL HIGH (ref 70–99)

## 2013-08-29 LAB — TROPONIN I

## 2013-08-29 LAB — D-DIMER, QUANTITATIVE (NOT AT ARMC): D DIMER QUANT: 2.43 ug{FEU}/mL — AB (ref 0.00–0.48)

## 2013-08-29 LAB — T3, FREE: T3, Free: 2.9 pg/mL (ref 2.3–4.2)

## 2013-08-29 MED ORDER — METOPROLOL TARTRATE 1 MG/ML IV SOLN
10.0000 mg | Freq: Four times a day (QID) | INTRAVENOUS | Status: DC
  Administered 2013-08-29: 10 mg via INTRAVENOUS
  Filled 2013-08-29: qty 10

## 2013-08-29 MED ORDER — LABETALOL HCL 5 MG/ML IV SOLN: 10.0000 mg | INTRAVENOUS | Status: DC | PRN

## 2013-08-29 MED ORDER — SODIUM CHLORIDE 0.9 % IV SOLN: INTRAVENOUS | Status: DC

## 2013-08-29 MED ORDER — ASPIRIN EC 81 MG PO TBEC
81.0000 mg | DELAYED_RELEASE_TABLET | Freq: Every day | ORAL | Status: DC
  Administered 2013-08-29: 81 mg via ORAL
  Filled 2013-08-29: qty 1

## 2013-08-29 MED ORDER — MAGNESIUM SULFATE 40 MG/ML IJ SOLN
2.0000 g | Freq: Once | INTRAMUSCULAR | Status: AC
  Administered 2013-08-29: 2 g via INTRAVENOUS
  Filled 2013-08-29: qty 50

## 2013-08-29 MED ORDER — MORPHINE SULFATE 2 MG/ML IJ SOLN: 1.0000 mg | INTRAMUSCULAR | Status: DC | PRN

## 2013-08-29 MED ORDER — CARVEDILOL 6.25 MG PO TABS: 6.2500 mg | ORAL_TABLET | Freq: Two times a day (BID) | ORAL | Status: DC

## 2013-08-29 MED ORDER — ISOSORBIDE DINITRATE 5 MG PO TABS
5.0000 mg | ORAL_TABLET | Freq: Two times a day (BID) | ORAL | Status: DC
  Administered 2013-08-29 – 2013-08-30 (×2): 5 mg via ORAL
  Filled 2013-08-29 (×3): qty 1

## 2013-08-29 MED ORDER — IOHEXOL 350 MG/ML SOLN
100.0000 mL | Freq: Once | INTRAVENOUS | Status: AC | PRN
  Administered 2013-08-29: 100 mL via INTRAVENOUS

## 2013-08-29 MED ORDER — INSULIN NPH (HUMAN) (ISOPHANE) 100 UNIT/ML ~~LOC~~ SUSP
35.0000 [IU] | Freq: Every day | SUBCUTANEOUS | Status: DC
  Filled 2013-08-29: qty 10

## 2013-08-29 MED ORDER — ACETAMINOPHEN 325 MG PO TABS: 650.0000 mg | ORAL_TABLET | ORAL | Status: DC | PRN

## 2013-08-29 MED ORDER — DEXTROSE 5 % IV SOLN
10.0000 mg/kg | Freq: Three times a day (TID) | INTRAVENOUS | Status: DC
  Administered 2013-08-29 – 2013-08-30 (×3): 670 mg via INTRAVENOUS
  Filled 2013-08-29 (×5): qty 13.4

## 2013-08-29 MED ORDER — CLOPIDOGREL BISULFATE 75 MG PO TABS
75.0000 mg | ORAL_TABLET | Freq: Every day | ORAL | Status: DC
  Filled 2013-08-29 (×2): qty 1

## 2013-08-29 MED ORDER — METOPROLOL TARTRATE 25 MG PO TABS
25.0000 mg | ORAL_TABLET | Freq: Two times a day (BID) | ORAL | Status: DC
  Filled 2013-08-29 (×2): qty 1

## 2013-08-29 MED ORDER — MAGNESIUM SULFATE 40 MG/ML IJ SOLN: 2.0000 g | Freq: Once | INTRAMUSCULAR | Status: DC

## 2013-08-29 MED ORDER — LISINOPRIL 40 MG PO TABS
40.0000 mg | ORAL_TABLET | Freq: Every day | ORAL | Status: DC
  Administered 2013-08-29: 40 mg via ORAL
  Filled 2013-08-29: qty 1

## 2013-08-29 MED ORDER — OXYCODONE HCL 5 MG PO TABS: 5.0000 mg | ORAL_TABLET | ORAL | Status: DC | PRN

## 2013-08-29 MED ORDER — SERTRALINE HCL 100 MG PO TABS
100.0000 mg | ORAL_TABLET | Freq: Two times a day (BID) | ORAL | Status: DC
  Administered 2013-08-29 – 2013-09-01 (×7): 100 mg via ORAL
  Filled 2013-08-29 (×8): qty 1

## 2013-08-29 MED ORDER — CARVEDILOL 6.25 MG PO TABS
6.2500 mg | ORAL_TABLET | Freq: Two times a day (BID) | ORAL | Status: DC
  Administered 2013-08-29 – 2013-08-30 (×2): 6.25 mg via ORAL
  Filled 2013-08-29 (×4): qty 1

## 2013-08-29 MED ORDER — ACETAMINOPHEN 650 MG RE SUPP: 650.0000 mg | Freq: Four times a day (QID) | RECTAL | Status: DC | PRN

## 2013-08-29 MED ORDER — SODIUM CHLORIDE 0.9 % IV BOLUS (SEPSIS)
1000.0000 mL | Freq: Once | INTRAVENOUS | Status: AC
  Administered 2013-08-29: 1000 mL via INTRAVENOUS

## 2013-08-29 MED ORDER — INSULIN ASPART 100 UNIT/ML ~~LOC~~ SOLN
0.0000 [IU] | Freq: Three times a day (TID) | SUBCUTANEOUS | Status: DC
  Administered 2013-08-29: 3 [IU] via SUBCUTANEOUS

## 2013-08-29 MED ORDER — SODIUM CHLORIDE 0.9 % IV SOLN
Freq: Once | INTRAVENOUS | Status: AC
  Administered 2013-08-29: 03:00:00 via INTRAVENOUS

## 2013-08-29 MED ORDER — INSULIN ASPART 100 UNIT/ML ~~LOC~~ SOLN
0.0000 [IU] | Freq: Three times a day (TID) | SUBCUTANEOUS | Status: DC
  Administered 2013-08-29: 4 [IU] via SUBCUTANEOUS
  Administered 2013-08-30 (×2): 7 [IU] via SUBCUTANEOUS
  Administered 2013-08-31: 3 [IU] via SUBCUTANEOUS
  Administered 2013-08-31: 4 [IU] via SUBCUTANEOUS
  Administered 2013-08-31: 3 [IU] via SUBCUTANEOUS
  Administered 2013-09-01: 4 [IU] via SUBCUTANEOUS
  Administered 2013-09-01: 3 [IU] via SUBCUTANEOUS

## 2013-08-29 MED ORDER — OXYCODONE-ACETAMINOPHEN 5-325 MG PO TABS: 1.0000 | ORAL_TABLET | ORAL | Status: DC | PRN

## 2013-08-29 MED ORDER — SODIUM CHLORIDE 0.9 % IV SOLN
INTRAVENOUS | Status: DC
  Administered 2013-08-29: 23:00:00 via INTRAVENOUS

## 2013-08-29 MED ORDER — HYDRALAZINE HCL 25 MG PO TABS
25.0000 mg | ORAL_TABLET | Freq: Three times a day (TID) | ORAL | Status: DC
  Administered 2013-08-29 – 2013-08-30 (×2): 25 mg via ORAL
  Filled 2013-08-29 (×5): qty 1

## 2013-08-29 MED ORDER — POTASSIUM CHLORIDE 10 MEQ/100ML IV SOLN
10.0000 meq | INTRAVENOUS | Status: AC
  Administered 2013-08-29 (×2): 10 meq via INTRAVENOUS
  Filled 2013-08-29 (×2): qty 100

## 2013-08-29 MED ORDER — POTASSIUM CHLORIDE 10 MEQ/100ML IV SOLN: 10.0000 meq | INTRAVENOUS | Status: DC

## 2013-08-29 MED ORDER — HYDRALAZINE HCL 20 MG/ML IJ SOLN
10.0000 mg | INTRAMUSCULAR | Status: DC | PRN
  Administered 2013-08-29 – 2013-09-01 (×2): 10 mg via INTRAVENOUS
  Filled 2013-08-29 (×2): qty 1

## 2013-08-29 MED ORDER — INSULIN NPH (HUMAN) (ISOPHANE) 100 UNIT/ML ~~LOC~~ SUSP: 25.0000 [IU] | Freq: Two times a day (BID) | SUBCUTANEOUS | Status: DC

## 2013-08-29 MED ORDER — SODIUM CHLORIDE 0.9 % IV SOLN
500.0000 mg | Freq: Two times a day (BID) | INTRAVENOUS | Status: DC
  Administered 2013-08-29 – 2013-09-01 (×7): 500 mg via INTRAVENOUS
  Filled 2013-08-29 (×9): qty 5

## 2013-08-29 MED ORDER — AMITRIPTYLINE HCL 25 MG PO TABS
25.0000 mg | ORAL_TABLET | Freq: Every evening | ORAL | Status: DC | PRN
  Filled 2013-08-29: qty 1

## 2013-08-29 MED ORDER — GADOBENATE DIMEGLUMINE 529 MG/ML IV SOLN
15.0000 mL | Freq: Once | INTRAVENOUS | Status: AC | PRN
  Administered 2013-08-29: 14 mL via INTRAVENOUS

## 2013-08-29 MED ORDER — HEPARIN SODIUM (PORCINE) 5000 UNIT/ML IJ SOLN
5000.0000 [IU] | Freq: Three times a day (TID) | INTRAMUSCULAR | Status: DC
  Administered 2013-08-29: 5000 [IU] via SUBCUTANEOUS
  Filled 2013-08-29 (×3): qty 1

## 2013-08-29 MED ORDER — FAMOTIDINE 20 MG PO TABS
20.0000 mg | ORAL_TABLET | Freq: Every day | ORAL | Status: DC
  Administered 2013-08-29 – 2013-09-01 (×4): 20 mg via ORAL
  Filled 2013-08-29 (×4): qty 1

## 2013-08-29 MED ORDER — INSULIN NPH (HUMAN) (ISOPHANE) 100 UNIT/ML ~~LOC~~ SUSP
25.0000 [IU] | Freq: Every day | SUBCUTANEOUS | Status: DC
  Filled 2013-08-29: qty 10

## 2013-08-29 MED ORDER — LABETALOL HCL 5 MG/ML IV SOLN
20.0000 mg | Freq: Once | INTRAVENOUS | Status: AC
  Administered 2013-08-29: 20 mg via INTRAVENOUS
  Filled 2013-08-29: qty 4

## 2013-08-29 MED ORDER — POTASSIUM CHLORIDE IN NACL 20-0.9 MEQ/L-% IV SOLN: INTRAVENOUS | Status: DC

## 2013-08-29 MED ORDER — ATORVASTATIN CALCIUM 80 MG PO TABS
80.0000 mg | ORAL_TABLET | Freq: Every day | ORAL | Status: DC
  Administered 2013-08-29 – 2013-09-01 (×4): 80 mg via ORAL
  Filled 2013-08-29 (×4): qty 1

## 2013-08-29 MED ORDER — SODIUM CHLORIDE 0.9 % IV BOLUS (SEPSIS)
250.0000 mL | Freq: Once | INTRAVENOUS | Status: AC
  Administered 2013-08-29: 250 mL via INTRAVENOUS

## 2013-08-29 MED ORDER — SODIUM CHLORIDE 0.9 % IV SOLN
1000.0000 mg | Freq: Once | INTRAVENOUS | Status: AC
  Administered 2013-08-29: 1000 mg via INTRAVENOUS
  Filled 2013-08-29: qty 10

## 2013-08-29 NOTE — ED Notes (Signed)
MD at bedside. 

## 2013-08-29 NOTE — H&P (Signed)
[facility] History and Physical  DANNITA LARKEY [id number] DOB: 1955-07-07 DOA: 08/28/2013  Referring physician: ER physician. PCP: [name], MD  Chief Complaint: Seizures.  HPI: [name] is a 58 y.o. female with history of diabetes mellitus, peripheral vascular disease, hypertension, CAD status post CABG who was recently admitted for left lower extremity osteomyelitis and had undergone trans-tibial amputation was brought to the ER after patient's family weakness last night patient to be fairly having tonic-clonic seizures and tongue bite for which patient was consciousness and after patient regained consciousness patient was found to be confused. Patient was brought to the ER and CT head showed left-sided edema and neurology on call was consulted. Patient was started on Keppra loading dose for possible seizures and admitted for further management. On my exam patient become more alert and awake is oriented to name place and follows commands and moves all extremities. Denies any headache or visual symptoms at this time. Patient does not recall the incident. Patient has had previous history of seizures 8 years ago. Patient otherwise denies any nausea vomiting abdominal pain chest pain shortness of breath fever chills headache neck pain. Patient has been afebrile. Patient was found to be tachycardic with a rate around 140 beats per minute and blood pressure was found to be elevated in the [id number] systolic. As per the family patient has not taken her medications yesterday and in the ER, ER physician had given labetalol 20 mg IV following which patient blood pressure had improved to [id number] systolic. On my exam patient's heart rate has come down to 104 beats a minute. Given patient's recent infection blood cultures were sent. Since DL was elevated CT angiogram of the chest was done which was negative for PE. Thyroid function test is pending.   Review of Systems: As presented in the history  of presenting illness, rest negative.  Past Medical History  Diagnosis Date  . Hypertension   . Neuropathy   . Peripheral vascular disease   . Coronary artery disease   . Anxiety   . Depression   . Anemia   . Type II diabetes mellitus   . High cholesterol   . History of blood transfusion     "one time; related to diabetes I guess"   . Arthritis     "hands"  . GERD (gastroesophageal reflux disease)     takes Zantac  . Complication of anesthesia     pt felt like she had a hard time waking up after surgery in Feb. 2015.  Marland Kitchen CHF (congestive heart failure)    Past Surgical History  Procedure Laterality Date  . Toe amputation Right     "took off a couple toes before the amputation"  . Below knee leg amputation Right Jan. 2008  . Carotid endarterectomy Right   . Cholecystectomy    . Ankle fusion Left 05/11/2013    TIBIOCALCANEAL FUSION   . Tonsillectomy    . Dilation and curettage of uterus    . Coronary artery bypass graft  12/01/2007    "CABG X4" (05/12/2013)  . Ankle fusion Left 05/12/2013    Procedure: LEFT TIBIOCALCANEAL FUSION;  Surgeon: Newt Minion, MD;  Location: [place];  Service: Orthopedics;  Laterality: Left;  Left Tibiocalcaneal Fusion  . Colonoscopy    . Irrigation and debridement abscess Left 06/17/2013    ANKLE           DR [name]  . I&d extremity Left 06/17/2013    Procedure: IRRIGATION AND DEBRIDEMENT  EXTREMITY, PLACEMENT ANTIBIOTIC STIMULAN BEADS;  Surgeon: Newt Minion, MD;  Location: [place];  Service: Orthopedics;  Laterality: Left;  . Application of wound vac  06/17/2013    Procedure: APPLICATION OF WOUND VAC;  Surgeon: Newt Minion, MD;  Location: [place];  Service: Orthopedics;;  . Amputation Left 08/22/2013    Procedure: LEFT BELOW KNEE AMPUTATION;  Surgeon: Newt Minion, MD;  Location: [place];  Service: Orthopedics;  Laterality: Left;   Social History:  reports that she has never smoked. She has never used smokeless tobacco. She reports that she does not drink  alcohol or use illicit drugs. Where does patient live home. Can patient participate in ADLs? No.  Allergies  Allergen Reactions  . Tape Rash    Redness, rash, itchiness    Family History:  Family History  Problem Relation Age of Onset  . Cancer Mother   . Heart disease Father   . Diabetes Father   . Hyperlipidemia Father   . Hypertension Father       Prior to Admission medications   Medication Sig Start Date End Date Taking? Authorizing Provider  amitriptyline (ELAVIL) 25 MG tablet Take 25 mg by mouth at bedtime as needed for sleep.    Yes Historical Provider, MD  aspirin 81 MG tablet Take 81 mg by mouth daily.     Yes Historical Provider, MD  atorvastatin (LIPITOR) 80 MG tablet Take 80 mg by mouth daily.     Yes Historical Provider, MD  clopidogrel (PLAVIX) 75 MG tablet Take 75 mg by mouth daily.    Yes Historical Provider, MD  insulin NPH Human (HUMULIN N,NOVOLIN N) 100 UNIT/ML injection Inject 25-35 Units into the skin 2 (two) times daily before a meal. 35 units in the morning and 25 units in the evening   Yes Historical Provider, MD  metFORMIN (GLUCOPHAGE) 500 MG tablet Take 500 mg by mouth 4 (four) times daily.     Yes Historical Provider, MD  metoprolol tartrate (LOPRESSOR) 25 MG tablet Take 25 mg by mouth 2 (two) times daily.    Yes Historical Provider, MD  Multiple Vitamins-Minerals (MULTIVITAMINS THER. W/MINERALS) TABS Take 1 tablet by mouth daily.     Yes Historical Provider, MD  oxyCODONE-acetaminophen (ROXICET) 5-325 MG per tablet Take 1 tablet by mouth every 4 (four) hours as needed for severe pain. 05/13/13  Yes Newt Minion, MD  quinapril (ACCUPRIL) 40 MG tablet Take 40 mg by mouth daily.    Yes Historical Provider, MD  ranitidine (ZANTAC) 150 MG tablet Take 150 mg by mouth 2 (two) times daily.    Yes Historical Provider, MD  sertraline (ZOLOFT) 100 MG tablet Take 100 mg by mouth 2 (two) times daily.    Yes Historical Provider, MD    Physical Exam: Filed Vitals:    08/29/13 0310 08/29/13 0339 08/29/13 0400 08/29/13 0500  BP:  157/72 158/68 133/52  Pulse: 98 104 105 106  Temp:   98 F (36.7 C)   TempSrc:   Oral   Resp: 15 18 14 20   Weight:  67 kg (147 lb 11.3 oz)    SpO2: 96% 97% 97% 92%     General:  Well-developed and moderately nourished.  Eyes: Anicteric no pallor.  ENT: No discharge from ears eyes nose mouth.  Neck: No neck rigidity. No mass felt.  Cardiovascular: S1-S2 heard tachycardic.  Respiratory: No rhonchi or crepitations.  Abdomen: Soft nontender bowel sounds present. No guarding or rigidity.  Skin: Left  leg stump is dressed. I do not see any obvious rashes.  Musculoskeletal: Bilateral BKA.  Psychiatric: Patient presently is alert and awake and follows commands.  Neurologic: Oriented to place and name. Moves all extremities.  Labs on Admission:  Basic Metabolic Panel:  Recent Labs Lab 08/25/13 0555 08/29/13 0111  NA 137 137  K 4.6 3.2*  CL 99 96  CO2 19 25  GLUCOSE 104* 303*  BUN 13 9  CREATININE 1.59* 1.17*  CALCIUM 8.8 8.8  MG  --  1.2*  PHOS  --  3.7   Liver Function Tests:  Recent Labs Lab 08/25/13 0555 08/29/13 0111  AST 33 22  ALT 55* 29  ALKPHOS 152* 138*  BILITOT 0.4 0.6  PROT 6.7 7.2  ALBUMIN 2.0* 2.3*   No results found for this basename: LIPASE, AMYLASE,  in the last 168 hours No results found for this basename: AMMONIA,  in the last 168 hours CBC:  Recent Labs Lab 08/23/13 1330 08/25/13 0555 08/29/13 0111  WBC 7.3 6.9 8.1  NEUTROABS  --   --  6.8  HGB 7.3* 6.8* 8.8*  HCT 23.7* 22.9* 28.3*  MCV 80.3 80.1 79.5  PLT 341 353 382   Cardiac Enzymes:  Recent Labs Lab 08/29/13 0111  TROPONINI <0.30    BNP (last 3 results) No results found for this basename: PROBNP,  in the last 8760 hours CBG:  Recent Labs Lab 08/25/13 0519 08/25/13 0908 08/25/13 1123 08/29/13 0017 08/29/13 0355  GLUCAP 91 121* 112* 264* 280*    Radiological Exams on Admission: Ct Head Wo  Contrast  08/29/2013   CLINICAL DATA:  Loss of consciousness.  Seizure like activity.  EXAM: CT HEAD WITHOUT CONTRAST  TECHNIQUE: Contiguous axial images were obtained from the base of the skull through the vertex without intravenous contrast.  COMPARISON:  10/05/2007.  FINDINGS: No midline shift, hydrocephalus, hemorrhage. No acute territorial cortical ischemia/infarct. Atrophy and chronic ischemic white matter disease is present. Motion artifact is present on the examination. The patient was moving. The slices were repeated. Intracranial atherosclerosis is present. Mild ethmoid air cell opacification on the right. The mastoid air cells appear normal. Sphenoid sinuses clear.  IMPRESSION: 1. Low attenuation in the left corona radiata and centrum semiovale is suspicious for vasogenic edema. This could represent asymmetric chronic ischemic white matter disease however the amount of progression since 2009 would be unusual. Followup MRI recommended for further assessment, preferably with and without contrast. 2. Study mildly degraded by motion artifact. 3. No definite acute intracranial abnormality.   Electronically Signed   By: Dereck Ligas M.D.   On: 08/29/2013 01:42   Ct Angio Chest Pe W/cm &/or Wo Cm  08/29/2013   CLINICAL DATA:  Syncope.  Assess for pulmonary embolus.  EXAM: CT ANGIOGRAPHY CHEST WITH CONTRAST  TECHNIQUE: Multidetector CT imaging of the chest was performed using the standard protocol during bolus administration of intravenous contrast. Multiplanar CT image reconstructions and MIPs were obtained to evaluate the vascular anatomy.  CONTRAST:  14mL OMNIPAQUE IOHEXOL 350 MG/ML SOLN  COMPARISON:  Chest radiograph performed earlier today at 12:58 a.m.  FINDINGS: There is no evidence of pulmonary embolus.  Minimal bilateral dependent subsegmental atelectasis is noted. The lungs are otherwise clear. There is no evidence of significant focal consolidation, pleural effusion or pneumothorax. No masses  are identified; no abnormal focal contrast enhancement is seen.  Diffuse coronary artery calcifications are seen. [name] status post median sternotomy. No pericardial effusion is identified. Visualized  mediastinal nodes remain normal in size. The great vessels are grossly unremarkable in appearance. No axillary lymphadenopathy is seen. The visualized portions of the thyroid gland are unremarkable in appearance.  The visualized portions of the liver are unremarkable. The spleen is enlarged, measuring 16.6 cm in length. [name] status post cholecystectomy, with clips noted along the gallbladder fossa.  No acute osseous abnormalities are seen.  Review of the MIP images confirms the above findings.  IMPRESSION: 1. No evidence of pulmonary embolus. 2. Minimal bilateral dependent atelectasis noted; lungs otherwise clear. 3. Diffuse coronary artery calcifications seen. 4. Splenomegaly noted.   Electronically Signed   By: Garald Balding M.D.   On: 08/29/2013 04:42   Dg Chest Port 1 View  08/29/2013   CLINICAL DATA:  Loss of consciousness.  EXAM: PORTABLE CHEST - 1 VIEW  COMPARISON:  05/11/2013.  FINDINGS: Cardiopericardial silhouette within normal limits for projection. Chronic blurring of the left heart border, likely associated with a large pericardial fat pad. Lung volumes are lower than on prior examinations. Basilar atelectasis. Median sternotomy/ CABG. Monitoring leads project over the chest. No airspace disease. No effusion.  IMPRESSION: Low volume chest.   Electronically Signed   By: Dereck Ligas M.D.   On: 08/29/2013 01:13    EKG: Independently reviewed. Sinus tachycardia.  Assessment/Plan Principal Problem:   Seizure Active Problems:   DIABETES MELLITUS   PVD (peripheral vascular disease)   Accelerated hypertension   1. Seizures - at this time neurologist has recommended MRI brain given patient's CT head findings concerning for edema. Patient has been placed on Keppra. Seizure  precautions. When necessary Ativan for seizures. Neurochecks. Swallow evaluation. 2. Sinus tachycardia - patient is afebrile. Blood cultures has been sent. Check thyroid function test. Continue with gentle hydration. 3. Accelerated hypertension - patient was given initially one dose of labetalol 20 mg following which patient blood pressure has improved. Continue home medications with when necessary IV labetalol. 4. Diabetes mellitus2 -  Continue with patient's home dose of insulin and closely follow CBGs with sliding-scale coverage. 5. Peripheral vascular disease status post recent left BKA - on antiplatelet agents. 6. Anemia - closely follow CBC.    Code Status: Full code.  Family Communication: Patient's family at the bedside.  Disposition Plan: Admit to inpatient.    Nimesh Riolo N. [facility] Pager [phone].  If 7PM-7AM, please contact night-coverage [email or web address] Password [facility] 08/29/2013, 5:37 AM

## 2013-08-29 NOTE — Consult Note (Signed)
Reason for Consult: Generalized seizure, hypertensive urgency and abnormal CT scan of the head.  HPI:                                                                                                                                          [name] is an 58 y.o. female a history of hypertension, diabetes mellitus, peripheral vascular disease with BKA amputation bilaterally, hyperlipidemia and generalized seizure 8 years ago, presenting with recurrent generalized seizure it occurred about 10:30 tonight. Patient has been somewhat slow and lethargic most of the day, but had no specific complaints. CT scan of her head showed vasogenic edema involving the left corona radiata and centrum semiovale. No clear mass lesion was noted. She's been afebrile. Blood pressure on presentation was 205/87 pulse was 136. She was given 20 mg of labetalol in current systolic blood pressure is 175 and pulse is 102. She was also given 2 g of magnesium sulfate IV. Blood sugar was 303. Electrolytes were unremarkable except for potassium of 3.2. Magnesium was low at 1.2.  Past Medical History  Diagnosis Date  . Hypertension   . Neuropathy   . Peripheral vascular disease   . Coronary artery disease   . Anxiety   . Depression   . Anemia   . Type II diabetes mellitus   . High cholesterol   . History of blood transfusion     "one time; related to diabetes I guess"   . Arthritis     "hands"  . GERD (gastroesophageal reflux disease)     takes Zantac  . Complication of anesthesia     pt felt like she had a hard time waking up after surgery in Feb. 2015.    Past Surgical History  Procedure Laterality Date  . Toe amputation Right     "took off a couple toes before the amputation"  . Below knee leg amputation Right Jan. 2008  . Carotid endarterectomy Right   . Cholecystectomy    . Ankle fusion Left 05/11/2013    TIBIOCALCANEAL FUSION   . Tonsillectomy    . Dilation and curettage of uterus    . Coronary artery  bypass graft  12/01/2007    "CABG X4" (05/12/2013)  . Ankle fusion Left 05/12/2013    Procedure: LEFT TIBIOCALCANEAL FUSION;  Surgeon: Newt Minion, MD;  Location: [place];  Service: Orthopedics;  Laterality: Left;  Left Tibiocalcaneal Fusion  . Colonoscopy    . Irrigation and debridement abscess Left 06/17/2013    ANKLE           DR [name]  . I&d extremity Left 06/17/2013    Procedure: IRRIGATION AND DEBRIDEMENT EXTREMITY, PLACEMENT ANTIBIOTIC STIMULAN BEADS;  Surgeon: Newt Minion, MD;  Location: [place];  Service: Orthopedics;  Laterality: Left;  . Application of wound vac  06/17/2013    Procedure: APPLICATION OF WOUND VAC;  Surgeon: Newt Minion, MD;  Location: [place];  Service: Orthopedics;;  . Amputation Left 08/22/2013    Procedure: LEFT BELOW KNEE AMPUTATION;  Surgeon: Newt Minion, MD;  Location: [place];  Service: Orthopedics;  Laterality: Left;    Family History  Problem Relation Age of Onset  . Cancer Mother   . Heart disease Father   . Diabetes Father   . Hyperlipidemia Father   . Hypertension Father     Social History:  reports that she has never smoked. She has never used smokeless tobacco. She reports that she does not drink alcohol or use illicit drugs.  Allergies  Allergen Reactions  . Tape Rash    Redness, rash, itchiness    MEDICATIONS:                                                                                                                     I have reviewed the patient's current medications.   ROS:                                                                                                                                       History obtained from spouse and the patient  General ROS: negative for - chills, fatigue, fever, night sweats, weight gain or weight loss Psychological ROS: negative for - behavioral disorder, hallucinations, memory difficulties, mood swings or suicidal ideation Ophthalmic ROS: negative for - blurry vision, double vision, eye pain or  loss of vision ENT ROS: negative for - epistaxis, nasal discharge, oral lesions, sore throat, tinnitus or vertigo Allergy and Immunology ROS: negative for - hives or itchy/watery eyes Hematological and Lymphatic ROS: negative for - bleeding problems, bruising or swollen lymph nodes Endocrine ROS: negative for - galactorrhea, hair pattern changes, polydipsia/polyuria or temperature intolerance Respiratory ROS: negative for - cough, hemoptysis, shortness of breath or wheezing Cardiovascular ROS: As noted in history of present illness Gastrointestinal ROS: negative for - abdominal pain, diarrhea, hematemesis, nausea/vomiting or stool incontinence Genito-Urinary ROS: negative for - dysuria, hematuria, incontinence or urinary frequency/urgency Musculoskeletal ROS: negative for - joint swelling or muscular weakness Neurological ROS: as noted in HPI Dermatological ROS: negative for rash and skin lesion changes   Blood pressure 176/65, pulse 102, temperature 98.3 F (36.8 C), temperature source Oral, resp. rate 18, SpO2 96.00%.   Neurologic Examination:  Mental Status: Alert, oriented x3, still slightly confused.  Speech fluent without evidence of aphasia. Slight difficulty with following commands with mild confusion.  Cranial Nerves: II-Visual fields were normal. III/IV/VI-Pupils were equal and reacted. Extraocular movements were full and conjugate.    V/VII-no facial numbness and no facial weakness. VIII-normal. X-normal speech and symmetrical palatal movement. Motor: Slight drift of right upper extremity; motor exam otherwise unremarkable. Sensory: Normal throughout. Deep Tendon Reflexes: Absent. Plantars: Bilateral BK amputations Cerebellar: Normal finger-to-nose testing. Carotid auscultation: Normal  No results found for this basename: cbc, bmp, coags, chol, tri, ldl, hga1c    Results  for orders placed during the hospital encounter of 08/28/13 (from the past 48 hour(s))  CBG MONITORING, ED     Status: Abnormal   Collection Time    08/29/13 12:17 AM      Result Value Ref Range   Glucose-Capillary 264 (*) 70 - 99 mg/dL  CBC WITH DIFFERENTIAL     Status: Abnormal   Collection Time    08/29/13  1:11 AM      Result Value Ref Range   WBC 8.1  4.0 - 10.5 K/uL   RBC 3.56 (*) 3.87 - 5.11 MIL/uL   Hemoglobin 8.8 (*) 12.0 - 15.0 g/dL   HCT 28.3 (*) 36.0 - 46.0 %   MCV 79.5  78.0 - 100.0 fL   MCH 24.7 (*) 26.0 - 34.0 pg   MCHC 31.1  30.0 - 36.0 g/dL   RDW 18.1 (*) 11.5 - 15.5 %   Platelets 382  150 - 400 K/uL   Neutrophils Relative % 83 (*) 43 - 77 %   Neutro Abs 6.8  1.7 - 7.7 K/uL   Lymphocytes Relative 7 (*) 12 - 46 %   Lymphs Abs 0.5 (*) 0.7 - 4.0 K/uL   Monocytes Relative 9  3 - 12 %   Monocytes Absolute 0.7  0.1 - 1.0 K/uL   Eosinophils Relative 1  0 - 5 %   Eosinophils Absolute 0.1  0.0 - 0.7 K/uL   Basophils Relative 0  0 - 1 %   Basophils Absolute 0.0  0.0 - 0.1 K/uL  COMPREHENSIVE METABOLIC PANEL     Status: Abnormal   Collection Time    08/29/13  1:11 AM      Result Value Ref Range   Sodium 137  137 - 147 mEq/L   Potassium 3.2 (*) 3.7 - 5.3 mEq/L   Chloride 96  96 - 112 mEq/L   CO2 25  19 - 32 mEq/L   Glucose, Bld 303 (*) 70 - 99 mg/dL   BUN 9  6 - 23 mg/dL   Creatinine, Ser 1.17 (*) 0.50 - 1.10 mg/dL   Calcium 8.8  8.4 - 10.5 mg/dL   Total Protein 7.2  6.0 - 8.3 g/dL   Albumin 2.3 (*) 3.5 - 5.2 g/dL   AST 22  0 - 37 U/L   ALT 29  0 - 35 U/L   Alkaline Phosphatase 138 (*) 39 - 117 U/L   Total Bilirubin 0.6  0.3 - 1.2 mg/dL   GFR calc non Af Amer 51 (*) >90 mL/min   GFR calc Af Amer 59 (*) >90 mL/min   Comment: (NOTE)     The eGFR has been calculated using the CKD EPI equation.     This calculation has not been validated in all clinical situations.     eGFR's persistently <90 mL/min signify possible Chronic Kidney  Disease.  TROPONIN I      Status: None   Collection Time    08/29/13  1:11 AM      Result Value Ref Range   Troponin I <0.30  <0.30 ng/mL   Comment:            Due to the release kinetics of cTnI,     a negative result within the first hours     of the onset of symptoms does not rule out     myocardial infarction with certainty.     If myocardial infarction is still suspected,     repeat the test at appropriate intervals.  MAGNESIUM     Status: Abnormal   Collection Time    08/29/13  1:11 AM      Result Value Ref Range   Magnesium 1.2 (*) 1.5 - 2.5 mg/dL  PHOSPHORUS     Status: None   Collection Time    08/29/13  1:11 AM      Result Value Ref Range   Phosphorus 3.7  2.3 - 4.6 mg/dL  I-STAT CG4 LACTIC ACID, ED     Status: None   Collection Time    08/29/13  1:24 AM      Result Value Ref Range   Lactic Acid, Venous 0.73  0.5 - 2.2 mmol/L  I-STAT VENOUS BLOOD GAS, ED     Status: Abnormal   Collection Time    08/29/13  1:25 AM      Result Value Ref Range   pH, Ven 7.502 (*) 7.250 - 7.300   pCO2, Ven 34.5 (*) 45.0 - 50.0 mmHg   pO2, Ven 70.0 (*) 30.0 - 45.0 mmHg   Bicarbonate 27.0 (*) 20.0 - 24.0 mEq/L   TCO2 28  0 - 100 mmol/L   O2 Saturation 95.0     Acid-Base Excess 4.0 (*) 0.0 - 2.0 mmol/L   Sample type VENOUS      Ct Head Wo Contrast  08/29/2013   CLINICAL DATA:  Loss of consciousness.  Seizure like activity.  EXAM: CT HEAD WITHOUT CONTRAST  TECHNIQUE: Contiguous axial images were obtained from the base of the skull through the vertex without intravenous contrast.  COMPARISON:  10/05/2007.  FINDINGS: No midline shift, hydrocephalus, hemorrhage. No acute territorial cortical ischemia/infarct. Atrophy and chronic ischemic white matter disease is present. Motion artifact is present on the examination. The patient was moving. The slices were repeated. Intracranial atherosclerosis is present. Mild ethmoid air cell opacification on the right. The mastoid air cells appear normal. Sphenoid sinuses clear.   IMPRESSION: 1. Low attenuation in the left corona radiata and centrum semiovale is suspicious for vasogenic edema. This could represent asymmetric chronic ischemic white matter disease however the amount of progression since 2009 would be unusual. Followup MRI recommended for further assessment, preferably with and without contrast. 2. Study mildly degraded by motion artifact. 3. No definite acute intracranial abnormality.   Electronically Signed   By: Dereck Ligas M.D.   On: 08/29/2013 01:42   Dg Chest Port 1 View  08/29/2013   CLINICAL DATA:  Loss of consciousness.  EXAM: PORTABLE CHEST - 1 VIEW  COMPARISON:  05/11/2013.  FINDINGS: Cardiopericardial silhouette within normal limits for projection. Chronic blurring of the left heart border, likely associated with a large pericardial fat pad. Lung volumes are lower than on prior examinations. Basilar atelectasis. Median sternotomy/ CABG. Monitoring leads project over the chest. No airspace disease. No effusion.  IMPRESSION: Low volume chest.  Electronically Signed   By: Dereck Ligas M.D.   On: 08/29/2013 01:13   Assessment/Plan: 58 year old lady presenting with generalized seizure associated markedly elevated blood pressure as well as low density lesion with vasogenic edema involving white matter of left hemisphere. Significance is unclear. High-grade neoplasm will need to be ruled out.  Recommendations: 1. MRI of the brain without and with contrast media. 2. Keppra 1000 mg IV loading dose followed by 500 mg every 12 hours. 3. Her surgery consultation if mass lesion is demonstrated on MRI study. 4. We'll consider management as vasogenic edema with Decadron, depending on MRI results.  C.R. Nicole Kindred, MD [facility] [phone]  08/29/2013, 2:38 AM

## 2013-08-29 NOTE — Progress Notes (Signed)
Inpatient Diabetes Program Recommendations  AACE/ADA: New Consensus Statement on Inpatient Glycemic Control (2013)  Target Ranges:  Prepandial:   less than 140 mg/dL      Peak postprandial:   less than 180 mg/dL (1-2 hours)      Critically ill patients:  140 - 180 mg/dL   Reason for Assessment:  Results for RYA, BISAILLON (MRN [id number]) as of 08/29/2013 10:02  Ref. Range 08/29/2013 00:17 08/29/2013 03:55 08/29/2013 07:41  Glucose-Capillary Latest Range: 70-99 mg/dL 264 (H) 280 (H) 237 (H)   Diabetes history: Type 2 Diabetes with recent BKA Outpatient Diabetes medications: NPH 35 units q AM, NPH 25 units q PM,  Metformin 500 mg qid Current orders for Inpatient glycemic control: Novolog resistant tid with meals  May consider adding Levemir 15 units daily.  May also consider reducing Novolog correction to moderate tid with meals.     Thanks, Adah Perl, RN, BC-ADM Inpatient Diabetes Coordinator Pager [phone]

## 2013-08-29 NOTE — Progress Notes (Signed)
Subjective: No further seizures  Exam: Filed Vitals:   08/29/13 0810  BP:   Pulse:   Temp: 99.1 F (37.3 C)  Resp:    Gen: In bed, NAD MS: awake, alert, oriented to month and place, but not year [id number], VFF Motor: right pronator drift.  Sensory:intact to LT   Impression: 58 yo F with left sided hypodensity suspicious for mass. Will need MRI brain w/wo contrast to further evaluate.   Recommendations: 1) continue keppra 500mg  BID  Roland Rack, MD [facility] [phone]  If 7pm- 7am, please page neurology on call as listed in [place].

## 2013-08-29 NOTE — Care Management Note (Signed)
    Page 1 of 1   08/29/2013     8:33:37 AM CARE MANAGEMENT NOTE 08/29/2013  Patient:  LETISIA, MENNING   Account Number:  [id number]  Date Initiated:  08/29/2013  Documentation initiated by:  Elissa Hefty  Subjective/Objective Assessment:   adm w seizures     Action/Plan:   lives w husband, pcp dr Gwyndolyn Saxon garlick   Anticipated DC Date:     Anticipated DC Plan:           Choice offered to / List presented to:             Status of service:   Medicare Important Message given?   (If response is "NO", the following Medicare IM given date fields will be blank) Date Medicare IM given:   Date Additional Medicare IM given:    Discharge Disposition:    Per UR Regulation:  Reviewed for med. necessity/level of care/duration of stay  If discussed at [place] of Stay Meetings, dates discussed:    Comments:

## 2013-08-29 NOTE — ED Notes (Signed)
Per EMS - pt from home, pt w/ LOC this evening, unresponsive per pt's son - possible seizure-like activity for approx 77min noted by son and difficulty breathing. On EMS arrival to scene pt noted to have spontaneous eye opening however was nonverbal and not responsive to EMS - pt w/ increased alertness en route, normal baseline mental status A&Ox4. Pt recently post-op x10 days for left AKA and has been taking percocet for pain.

## 2013-08-29 NOTE — ED Notes (Signed)
CBG Taken = 264

## 2013-08-29 NOTE — Progress Notes (Addendum)
[facility] TEAM 1 - Stepdown/ICU TEAM Progress Note  [name] [id number] DOB: [birth year] DOA: [id number] PCP: [name], MD  Admit HPI / Brief Narrative: 58 y.o. female with history of diabetes mellitus, peripheral vascular disease, hypertension, CAD status post CABG who was recently admitted for left lower extremity osteomyelitis and had undergone trans-tibial amputation was brought to the ER after patient's family noted her to be having tonic-clonic seizures and tongue biting. Patient was brought to the ER and CT head showed left-sided edema and Neurology on call was consulted. Patient was started on Keppra loading dose for possible seizures and admitted for further management. Patient has a history of seizures 8 years prior. Patient was found to be tachycardic with a rate around 140 beats per minute and blood pressure was found to be elevated in the [id number] systolic.   HPI/Subjective: Pt seen for f/u visit.  Assessment/Plan:  Seizure  L brain hypodensity suspicious for mass - MRI pending   Sinus tachycardia No PE on CTA of chest   Accelerated HTN BP improving   PVD with osteomyelitis status post left BKA 08/22/2013  CAD s/p CABG  DM2 - uncontrolled - with vascular complications  CBG poorly controlled - adjust tx and follow  Hypomagnesemia Replete and f/u in AM   Mild hypokalemia  Correct Mg and then correct K+ - follow   Anemia of chronic disease  MRSA screen +  Code Status: FULL Family Communication: no family present at time of exam Disposition Plan: transfer to neuro tele bed   Consultants: Neurology  Procedures: none  Antibiotics: none  DVT prophylaxis: SQ heparin  Objective: Blood pressure 142/67, pulse 105, temperature 99.1 F (37.3 C), temperature source Oral, resp. rate 18, weight 67 kg (147 lb 11.3 oz), SpO2 93.00%.  Intake/Output Summary (Last 24 hours) at 08/29/13 0923 Last data filed at 08/29/13 0800  Gross per 24 hour  Intake    1620 ml  Output    575 ml  Net   1045 ml   Exam: F/U exam completed  Data Reviewed: Basic Metabolic Panel:  Recent Labs Lab 08/25/13 0555 08/29/13 0111 08/29/13 0746  NA 137 137 138  K 4.6 3.2* 3.4*  CL 99 96 100  CO2 19 25 24   GLUCOSE 104* 303* 260*  BUN 13 9 8   CREATININE 1.59* 1.17* 1.14*  CALCIUM 8.8 8.8 8.6  MG  --  1.2*  --   PHOS  --  3.7  --    Liver Function Tests:  Recent Labs Lab 08/25/13 0555 08/29/13 0111 08/29/13 0746  AST 33 22 18  ALT 55* 29 25  ALKPHOS 152* 138* 125*  BILITOT 0.4 0.6 0.4  PROT 6.7 7.2 6.6  ALBUMIN 2.0* 2.3* 2.0*   No results found for this basename: LIPASE, AMYLASE,  in the last 168 hours No results found for this basename: AMMONIA,  in the last 168 hours  CBC:  Recent Labs Lab 08/23/13 1330 08/25/13 0555 08/29/13 0111 08/29/13 0746  WBC 7.3 6.9 8.1 5.8  NEUTROABS  --   --  6.8 4.2  HGB 7.3* 6.8* 8.8* 8.1*  HCT 23.7* 22.9* 28.3* 26.5*  MCV 80.3 80.1 79.5 79.3  PLT 341 353 382 369   Cardiac Enzymes:  Recent Labs Lab 08/29/13 0111  TROPONINI <0.30   BNP (last 3 results) No results found for this basename: PROBNP,  in the last 8760 hours  CBG:  Recent Labs Lab 08/25/13 0908 08/25/13 1123 08/29/13 0017 08/29/13 0355 08/29/13 0741  GLUCAP 121* 112* 264* 280* 237*    Recent Results (from the past 240 hour(s))  CULTURE, BLOOD (ROUTINE X 2)     Status: None   Collection Time    08/21/13  8:10 PM      Result Value Ref Range Status   Specimen Description BLOOD RIGHT ARM   Final   Special Requests BOTTLES DRAWN AEROBIC AND ANAEROBIC 10CC EA   Final   Culture  Setup Time     Final   Value: 08/22/2013 01:52     Performed at [facility]   Culture     Final   Value: NO GROWTH 5 DAYS     Performed at [facility]   Report Status 08/28/2013 FINAL   Final  CULTURE, BLOOD (ROUTINE X 2)     Status: None   Collection Time    08/21/13  8:20 PM      Result Value Ref Range Status   Specimen  Description BLOOD RIGHT HAND   Final   Special Requests BOTTLES DRAWN AEROBIC ONLY 10CC   Final   Culture  Setup Time     Final   Value: 08/22/2013 01:52     Performed at [facility]   Culture     Final   Value: NO GROWTH 5 DAYS     Performed at [facility]   Report Status 08/28/2013 FINAL   Final  MRSA PCR SCREENING     Status: Abnormal   Collection Time    08/22/13  4:49 AM      Result Value Ref Range Status   MRSA by PCR POSITIVE (*) NEGATIVE Final   Comment:            The GeneXpert MRSA Assay (FDA     approved for NASAL specimens     only), is one component of a     comprehensive MRSA colonization     surveillance program. It is not     intended to diagnose MRSA     infection nor to guide or     monitor treatment for     MRSA infections.     RESULT CALLED TO, READ BACK BY AND VERIFIED WITH:     [facility] RN [id number] AT 0652 SKEEN,P  MRSA PCR SCREENING     Status: None   Collection Time    08/29/13  3:42 AM      Result Value Ref Range Status   MRSA by PCR NEGATIVE  NEGATIVE Final   Comment:            The GeneXpert MRSA Assay (FDA     approved for NASAL specimens     only), is one component of a     comprehensive MRSA colonization     surveillance program. It is not     intended to diagnose MRSA     infection nor to guide or     monitor treatment for     MRSA infections.     Studies:  Recent x-ray studies have been reviewed in detail by the Attending Physician  Scheduled Meds:  Scheduled Meds: . aspirin EC  81 mg Oral Daily  . atorvastatin  80 mg Oral Daily  . clopidogrel  75 mg Oral Q breakfast  . famotidine  20 mg Oral Daily  . heparin  5,000 Units Subcutaneous 3 times per day  . insulin aspart  0-20 Units Subcutaneous TID WC  . levETIRAcetam  500 mg Intravenous Q12H  .  lisinopril  40 mg Oral Daily  . metoprolol  10 mg Intravenous 4 times per day  . potassium chloride  10 mEq Intravenous Q1 Hr x 2  . sertraline  100 mg Oral BID  . sodium  chloride  250 mL Intravenous Once    Time spent on care of this patient: 25+ mins   MCCLUNG,JEFFREY T , MD   [facility] Office  [phone] Pager - Text Page per Shea Evans as per below:  On-Call/Text Page:      [email or web address]      password [email or web address]  If 7PM-7AM, please contact night-coverage [email or web address] Password [email or web address] 08/29/2013, 9:23 AM   LOS: 1 day

## 2013-08-29 NOTE — ED Provider Notes (Addendum)
CSN: [id number]     Arrival date & time 08/28/13  2357 History   First MD Initiated Contact with Patient 08/28/13 2359     Chief Complaint  Patient presents with  . Loss of Consciousness     (Consider location/radiation/quality/duration/timing/severity/associated sxs/prior Treatment) HPI Comments: 58 y.o. female with history of HTN, CAD, DM type 2 with insensate neuropathy, R-BKA - currently being tx for infection of the left stump. Pt comes in with cc of seizure. Per son, who witnessed the episode, patient was having supper, and just fell onto her table. She started frothing, and biting her tongue (bleeding). Pt had no generalized shaking. Symptoms lasted for a 5 minutes or so, and patient was very confused initially.  Leading upto the episode, patient was having a routine day. She had a seizure 8 years ago, and is not on any meds currently. Pt is noted to be tachycardic, hypertensive at arrival. She has no headaches, chest pain, dib. States that she is taking her meds as prescribed. No fevers, chills, neck pain or stiffness.  Pt is oriented to self only, but is recognizing her family and able to follow simple commands and converse.  Patient is a 58 y.o. female presenting with syncope. The history is provided by the patient, medical records, a relative and the spouse.  Loss of Consciousness   Past Medical History  Diagnosis Date  . Hypertension   . Neuropathy   . Peripheral vascular disease   . Coronary artery disease   . Anxiety   . Depression   . Anemia   . Type II diabetes mellitus   . High cholesterol   . History of blood transfusion     "one time; related to diabetes I guess"   . Arthritis     "hands"  . GERD (gastroesophageal reflux disease)     takes Zantac  . Complication of anesthesia     pt felt like she had a hard time waking up after surgery in Feb. 2015.   Past Surgical History  Procedure Laterality Date  . Toe amputation Right     "took off a couple toes  before the amputation"  . Below knee leg amputation Right Jan. 2008  . Carotid endarterectomy Right   . Cholecystectomy    . Ankle fusion Left 05/11/2013    TIBIOCALCANEAL FUSION   . Tonsillectomy    . Dilation and curettage of uterus    . Coronary artery bypass graft  12/01/2007    "CABG X4" (05/12/2013)  . Ankle fusion Left 05/12/2013    Procedure: LEFT TIBIOCALCANEAL FUSION;  Surgeon: Newt Minion, MD;  Location: [place];  Service: Orthopedics;  Laterality: Left;  Left Tibiocalcaneal Fusion  . Colonoscopy    . Irrigation and debridement abscess Left 06/17/2013    ANKLE           DR [name]  . I&d extremity Left 06/17/2013    Procedure: IRRIGATION AND DEBRIDEMENT EXTREMITY, PLACEMENT ANTIBIOTIC STIMULAN BEADS;  Surgeon: Newt Minion, MD;  Location: [place];  Service: Orthopedics;  Laterality: Left;  . Application of wound vac  06/17/2013    Procedure: APPLICATION OF WOUND VAC;  Surgeon: Newt Minion, MD;  Location: [place];  Service: Orthopedics;;  . Amputation Left 08/22/2013    Procedure: LEFT BELOW KNEE AMPUTATION;  Surgeon: Newt Minion, MD;  Location: [place];  Service: Orthopedics;  Laterality: Left;   Family History  Problem Relation Age of Onset  . Cancer Mother   .  Heart disease Father   . Diabetes Father   . Hyperlipidemia Father   . Hypertension Father    History  Substance Use Topics  . Smoking status: Never Smoker   . Smokeless tobacco: Never Used  . Alcohol Use: No   OB History   Grav Para Term Preterm Abortions TAB SAB Ect Mult Living                 Review of Systems  Unable to perform ROS: Mental status change  Cardiovascular: Positive for syncope.      Allergies  Tape  Home Medications   Prior to Admission medications   Medication Sig Start Date End Date Taking? Authorizing Provider  amitriptyline (ELAVIL) 25 MG tablet Take 25 mg by mouth at bedtime as needed for sleep.     Historical Provider, MD  aspirin 81 MG tablet Take 81 mg by mouth daily.       Historical Provider, MD  atorvastatin (LIPITOR) 80 MG tablet Take 80 mg by mouth daily.      Historical Provider, MD  clopidogrel (PLAVIX) 75 MG tablet Take 75 mg by mouth daily.     Historical Provider, MD  insulin NPH Human (HUMULIN N,NOVOLIN N) 100 UNIT/ML injection Inject 25-35 Units into the skin 2 (two) times daily before a meal. 35 units in the morning and 25 units in the evening    Historical Provider, MD  metFORMIN (GLUCOPHAGE) 500 MG tablet Take 500 mg by mouth 4 (four) times daily.      Historical Provider, MD  metoprolol tartrate (LOPRESSOR) 25 MG tablet Take 25 mg by mouth 2 (two) times daily.     Historical Provider, MD  Multiple Vitamins-Minerals (MULTIVITAMINS THER. W/MINERALS) TABS Take 1 tablet by mouth daily.      Historical Provider, MD  oxyCODONE-acetaminophen (ROXICET) 5-325 MG per tablet Take 1 tablet by mouth every 4 (four) hours as needed for severe pain. 05/13/13   Newt Minion, MD  quinapril (ACCUPRIL) 40 MG tablet Take 40 mg by mouth daily.     Historical Provider, MD  ranitidine (ZANTAC) 150 MG tablet Take 150 mg by mouth 2 (two) times daily.     Historical Provider, MD  sertraline (ZOLOFT) 100 MG tablet Take 100 mg by mouth 2 (two) times daily.     Historical Provider, MD  silver sulfADIAZINE (SILVADENE) 1 % cream Apply 1 application topically daily.    Historical Provider, MD   BP 180/82  Pulse 136  Temp(Src) 98.3 F (36.8 C) (Oral)  Resp 18  SpO2 96% Physical Exam  Nursing note and vitals reviewed. Constitutional: She appears well-developed and well-nourished.  HENT:  Head: Normocephalic and atraumatic.  Eyes: EOM are normal. Pupils are equal, round, and reactive to light.  Neck: Neck supple. No JVD present.  Cardiovascular: Normal rate, regular rhythm and normal heart sounds.   No murmur heard. Pulmonary/Chest: Effort normal. No respiratory distress.  Abdominal: Soft. She exhibits no distension. There is no tenderness. There is no rebound and no guarding.   Musculoskeletal:  Bilateral lower ext amputations  Neurological: She is alert. No cranial nerve deficit.  Skin: Skin is warm and dry.    ED Course  Procedures (including critical care time) Labs Review Labs Reviewed  CBC WITH DIFFERENTIAL - Abnormal; Notable for the following:    RBC 3.56 (*)    Hemoglobin 8.8 (*)    HCT 28.3 (*)    MCH 24.7 (*)    RDW 18.1 (*)  Neutrophils Relative % 83 (*)    Lymphocytes Relative 7 (*)    Lymphs Abs 0.5 (*)    All other components within normal limits  COMPREHENSIVE METABOLIC PANEL - Abnormal; Notable for the following:    Potassium 3.2 (*)    Glucose, Bld 303 (*)    Creatinine, Ser 1.17 (*)    Albumin 2.3 (*)    Alkaline Phosphatase 138 (*)    GFR calc non Af Amer 51 (*)    GFR calc Af Amer 59 (*)    All other components within normal limits  MAGNESIUM - Abnormal; Notable for the following:    Magnesium 1.2 (*)    All other components within normal limits  CBG MONITORING, ED - Abnormal; Notable for the following:    Glucose-Capillary 264 (*)    All other components within normal limits  I-STAT VENOUS BLOOD GAS, ED - Abnormal; Notable for the following:    pH, Ven 7.502 (*)    pCO2, Ven 34.5 (*)    pO2, Ven 70.0 (*)    Bicarbonate 27.0 (*)    Acid-Base Excess 4.0 (*)    All other components within normal limits  CULTURE, BLOOD (ROUTINE X 2)  CULTURE, BLOOD (ROUTINE X 2)  TROPONIN I  PHOSPHORUS  I-STAT CG4 LACTIC ACID, ED    Imaging Review Ct Head Wo Contrast  08/29/2013   CLINICAL DATA:  Loss of consciousness.  Seizure like activity.  EXAM: CT HEAD WITHOUT CONTRAST  TECHNIQUE: Contiguous axial images were obtained from the base of the skull through the vertex without intravenous contrast.  COMPARISON:  10/05/2007.  FINDINGS: No midline shift, hydrocephalus, hemorrhage. No acute territorial cortical ischemia/infarct. Atrophy and chronic ischemic white matter disease is present. Motion artifact is present on the examination. The  patient was moving. The slices were repeated. Intracranial atherosclerosis is present. Mild ethmoid air cell opacification on the right. The mastoid air cells appear normal. Sphenoid sinuses clear.  IMPRESSION: 1. Low attenuation in the left corona radiata and centrum semiovale is suspicious for vasogenic edema. This could represent asymmetric chronic ischemic white matter disease however the amount of progression since 2009 would be unusual. Followup MRI recommended for further assessment, preferably with and without contrast. 2. Study mildly degraded by motion artifact. 3. No definite acute intracranial abnormality.   Electronically Signed   By: Dereck Ligas M.D.   On: 08/29/2013 01:42   Dg Chest Port 1 View  08/29/2013   CLINICAL DATA:  Loss of consciousness.  EXAM: PORTABLE CHEST - 1 VIEW  COMPARISON:  05/11/2013.  FINDINGS: Cardiopericardial silhouette within normal limits for projection. Chronic blurring of the left heart border, likely associated with a large pericardial fat pad. Lung volumes are lower than on prior examinations. Basilar atelectasis. Median sternotomy/ CABG. Monitoring leads project over the chest. No airspace disease. No effusion.  IMPRESSION: Low volume chest.   Electronically Signed   By: Dereck Ligas M.D.   On: 08/29/2013 01:13     EKG Interpretation   Date/Time:  Monday August 29 2013 00:11:11 EDT Ventricular Rate:  135 PR Interval:  120 QRS Duration: 100 QT Interval:  319 QTC Calculation: 478 R Axis:   26 Text Interpretation:  Sinus tachycardia LVH with secondary repolarization  abnormality Baseline wander in lead(s) II III aVF non specific ST wave  changes Confirmed by Kathrynn Humble, MD, Thelma Comp [phone]) on 08/29/2013 12:37:00 AM      MDM   Final diagnoses:  Hypertensive emergency  Seizure  DDx: -Seizure disorder -Meningitis -Trauma -ICH -Electrolyte abnormality -Metabolic derangement -Stroke -Toxin induced seizures -Medication side  effects -Hypoxia -Hypoglycemia  Pt comes in with cc of seizure. Witnessed by son, she is still post ictal. Pt has no focal neuro deficits. She is noted to be tachycardic and hypertensive at arrival (HR 130s), MAP (120).  PT denies fevers, neck pain/stiffness. She is on cipro right now for her left leg stump that Dr. Sharol Given just checked on Monday and was normal (and wants no one to change the dressing until Thursday).  CT scan shows some edema type findings.  Neuro consulted - and wants MRI tomorrow. I asked for CT-A and they dont think it is necessary. Dr. Rochele Pages to follow. No seizure meds recs currently. Dr. Nicole Kindred doesn't think this is a stroke type finding on CT - and so clears for BP control.  Will start with labetalol - goal SBP of 160 or MAP 90.  Pt will need admission.     Varney Biles, MD 08/29/13 0207  2:24 AM My clinical suspicion for PE is low - given the tachycardia. Hospitalist team wants to r/o PE. WELLS score is 3 - moderate risk. Will get dimer. TSH ordered per IM request as well.  Varney Biles, MD 08/29/13 0225  CRITICAL CARE Performed by: Varney Biles   Total critical care time: 45 min - BP management with IV drugs  Critical care time was exclusive of separately billable procedures and treating other patients.  Critical care was necessary to treat or prevent imminent or life-threatening deterioration.  Critical care was time spent personally by me on the following activities: development of treatment plan with patient and/or surrogate as well as nursing, discussions with consultants, evaluation of patient's response to treatment, examination of patient, obtaining history from patient or surrogate, ordering and performing treatments and interventions, ordering and review of laboratory studies, ordering and review of radiographic studies, pulse oximetry and re-evaluation of patient's condition.   Varney Biles, MD 08/29/13 0225

## 2013-08-30 ENCOUNTER — Inpatient Hospital Stay (HOSPITAL_COMMUNITY): Payer: Medicare Other

## 2013-08-30 LAB — CBC
HEMATOCRIT: 26.4 % — AB (ref 36.0–46.0)
HEMOGLOBIN: 7.9 g/dL — AB (ref 12.0–15.0)
MCH: 24.2 pg — AB (ref 26.0–34.0)
MCHC: 29.9 g/dL — ABNORMAL LOW (ref 30.0–36.0)
MCV: 80.7 fL (ref 78.0–100.0)
Platelets: 369 10*3/uL (ref 150–400)
RBC: 3.27 MIL/uL — AB (ref 3.87–5.11)
RDW: 18.4 % — ABNORMAL HIGH (ref 11.5–15.5)
WBC: 5.7 10*3/uL (ref 4.0–10.5)

## 2013-08-30 LAB — GLUCOSE, CAPILLARY
GLUCOSE-CAPILLARY: 220 mg/dL — AB (ref 70–99)
GLUCOSE-CAPILLARY: 119 mg/dL — AB (ref 70–99)
Glucose-Capillary: 227 mg/dL — ABNORMAL HIGH (ref 70–99)
Glucose-Capillary: 201 mg/dL — ABNORMAL HIGH (ref 70–99)

## 2013-08-30 LAB — BASIC METABOLIC PANEL
BUN: 10 mg/dL (ref 6–23)
CHLORIDE: 103 meq/L (ref 96–112)
CO2: 25 meq/L (ref 19–32)
Calcium: 8.6 mg/dL (ref 8.4–10.5)
Creatinine, Ser: 1.32 mg/dL — ABNORMAL HIGH (ref 0.50–1.10)
GFR calc non Af Amer: 44 mL/min — ABNORMAL LOW (ref >90)
GFR, EST AFRICAN AMERICAN: 51 mL/min — AB (ref >90)
Glucose, Bld: 227 mg/dL — ABNORMAL HIGH (ref 70–99)
POTASSIUM: 3.3 meq/L — AB (ref 3.7–5.3)
Sodium: 142 mEq/L (ref 137–147)

## 2013-08-30 LAB — LIPID PANEL
Cholesterol: 113 mg/dL (ref 0–200)
HDL: 36 mg/dL — AB (ref >39)
LDL Cholesterol: 47 mg/dL (ref 0–99)
Total CHOL/HDL Ratio: 3.1 RATIO
Triglycerides: 149 mg/dL (ref <150)
VLDL: 30 mg/dL (ref 0–40)

## 2013-08-30 LAB — MAGNESIUM: Magnesium: 1.9 mg/dL (ref 1.5–2.5)

## 2013-08-30 MED ORDER — CARVEDILOL 12.5 MG PO TABS
12.5000 mg | ORAL_TABLET | Freq: Two times a day (BID) | ORAL | Status: DC
  Administered 2013-08-30 – 2013-08-31 (×2): 12.5 mg via ORAL
  Filled 2013-08-30 (×4): qty 1

## 2013-08-30 MED ORDER — LORAZEPAM 2 MG/ML IJ SOLN
1.0000 mg | Freq: Once | INTRAMUSCULAR | Status: AC
  Administered 2013-08-30: 1 mg via INTRAVENOUS
  Filled 2013-08-30: qty 1

## 2013-08-30 MED ORDER — DEXTROSE 5 % IV SOLN
10.0000 mg/kg | Freq: Two times a day (BID) | INTRAVENOUS | Status: DC
  Administered 2013-08-31 – 2013-09-01 (×3): 670 mg via INTRAVENOUS
  Filled 2013-08-30 (×4): qty 13.4

## 2013-08-30 MED ORDER — GADOBENATE DIMEGLUMINE 529 MG/ML IV SOLN
15.0000 mL | Freq: Once | INTRAVENOUS | Status: AC
  Administered 2013-08-30: 14 mL via INTRAVENOUS

## 2013-08-30 MED ORDER — INSULIN NPH (HUMAN) (ISOPHANE) 100 UNIT/ML ~~LOC~~ SUSP
14.0000 [IU] | Freq: Two times a day (BID) | SUBCUTANEOUS | Status: DC
  Administered 2013-08-30 – 2013-09-01 (×4): 14 [IU] via SUBCUTANEOUS
  Filled 2013-08-30: qty 10

## 2013-08-30 MED ORDER — HYDRALAZINE HCL 50 MG PO TABS
50.0000 mg | ORAL_TABLET | Freq: Three times a day (TID) | ORAL | Status: DC
  Administered 2013-08-30 – 2013-09-01 (×5): 50 mg via ORAL
  Filled 2013-08-30 (×8): qty 1

## 2013-08-30 MED ORDER — INSULIN NPH (HUMAN) (ISOPHANE) 100 UNIT/ML ~~LOC~~ SUSP
18.0000 [IU] | Freq: Two times a day (BID) | SUBCUTANEOUS | Status: DC
  Filled 2013-08-30: qty 10

## 2013-08-30 MED ORDER — ISOSORBIDE DINITRATE 10 MG PO TABS
10.0000 mg | ORAL_TABLET | Freq: Two times a day (BID) | ORAL | Status: DC
  Administered 2013-08-30 – 2013-09-01 (×4): 10 mg via ORAL
  Filled 2013-08-30 (×5): qty 1

## 2013-08-30 MED ORDER — POTASSIUM CHLORIDE CRYS ER 20 MEQ PO TBCR
40.0000 meq | EXTENDED_RELEASE_TABLET | Freq: Once | ORAL | Status: AC
  Administered 2013-08-30: 40 meq via ORAL
  Filled 2013-08-30: qty 2

## 2013-08-30 NOTE — Consult Note (Signed)
PHARMACY CONSULT NOTE - INITIAL  Pharmacy Consult for :   Acyclovir Indication:  Empiric Acyclovir for suspected Viral Encephalitis  Hospital Problems: Principal Problem:   Seizure Active Problems:   DIABETES MELLITUS   PVD (peripheral vascular disease)   Accelerated hypertension   Suspected Viral Encephalitis  Allergies: Allergies  Allergen Reactions  . Tape Rash    Redness, rash, itchiness    Patient Measurements: Height: 5\' 5"  (165.1 cm) Weight: 147 lb 11.3 oz (67 kg) IBW/kg (Calculated) : 57  Vital Signs: BP 176/75  Pulse 105  Temp(Src) 97.7 F (36.5 C) (Oral)  Resp 18  Ht 5\' 5"  (1.651 m)  Wt 147 lb 11.3 oz (67 kg)  BMI 24.58 kg/m2  SpO2 95%  Labs:  Recent Labs  08/29/13 0111 08/29/13 0746 08/30/13 0550  WBC 8.1 5.8 5.7  HGB 8.8* 8.1* 7.9*  PLT 382 369 369  CREATININE 1.17* 1.14* 1.32*   Estimated Creatinine Clearance: 42.8 ml/min (by C-G formula based on Cr of 1.32).   Microbiology: Recent Results (from the past 720 hour(s))  CULTURE, BLOOD (ROUTINE X 2)     Status: None   Collection Time    08/21/13  8:10 PM      Result Value Ref Range Status   Specimen Description BLOOD RIGHT ARM   Final   Special Requests BOTTLES DRAWN AEROBIC AND ANAEROBIC 10CC EA   Final   Culture  Setup Time     Final   Value: 08/22/2013 01:52     Performed at [facility]   Culture     Final   Value: NO GROWTH 5 DAYS     Performed at [facility]   Report Status 08/28/2013 FINAL   Final  CULTURE, BLOOD (ROUTINE X 2)     Status: None   Collection Time    08/21/13  8:20 PM      Result Value Ref Range Status   Specimen Description BLOOD RIGHT HAND   Final   Special Requests BOTTLES DRAWN AEROBIC ONLY 10CC   Final   Culture  Setup Time     Final   Value: 08/22/2013 01:52     Performed at [facility]   Culture     Final   Value: NO GROWTH 5 DAYS     Performed at [facility]   Report Status 08/28/2013 FINAL   Final  MRSA PCR  SCREENING     Status: Abnormal   Collection Time    08/22/13  4:49 AM      Result Value Ref Range Status   MRSA by PCR POSITIVE (*) NEGATIVE Final   Comment:            The GeneXpert MRSA Assay (FDA     approved for NASAL specimens     only), is one component of a     comprehensive MRSA colonization     surveillance program. It is not     intended to diagnose MRSA     infection nor to guide or     monitor treatment for     MRSA infections.     RESULT CALLED TO, READ BACK BY AND VERIFIED WITH:     [facility] RN [phone] AT [phone] SKEEN,P  CULTURE, BLOOD (ROUTINE X 2)     Status: None   Collection Time    08/29/13  1:06 AM      Result Value Ref Range Status   Specimen Description BLOOD RIGHT ARM  Final   Special Requests BOTTLES DRAWN AEROBIC AND ANAEROBIC 10CC EACH   Final   Culture  Setup Time     Final   Value: 08/29/2013 04:13     Performed at [facility]   Culture     Final   Value:        BLOOD CULTURE RECEIVED NO GROWTH TO DATE CULTURE WILL BE HELD FOR 5 DAYS BEFORE ISSUING A FINAL NEGATIVE REPORT     Performed at [facility]   Report Status PENDING   Incomplete  CULTURE, BLOOD (ROUTINE X 2)     Status: None   Collection Time    08/29/13  1:11 AM      Result Value Ref Range Status   Specimen Description BLOOD RIGHT WRIST   Final   Special Requests BOTTLES DRAWN AEROBIC ONLY 5CC   Final   Culture  Setup Time     Final   Value: 08/29/2013 04:13     Performed at [facility]   Culture     Final   Value:        BLOOD CULTURE RECEIVED NO GROWTH TO DATE CULTURE WILL BE HELD FOR 5 DAYS BEFORE ISSUING A FINAL NEGATIVE REPORT     Performed at [facility]   Report Status PENDING   Incomplete  MRSA PCR SCREENING     Status: None   Collection Time    08/29/13  3:42 AM      Result Value Ref Range Status   MRSA by PCR NEGATIVE  NEGATIVE Final   Comment:            The GeneXpert MRSA Assay (FDA     approved for NASAL specimens     only), is one  component of a     comprehensive MRSA colonization     surveillance program. It is not     intended to diagnose MRSA     infection nor to guide or     monitor treatment for     MRSA infections.    Medical/Surgical History: Past Medical History  Diagnosis Date  . Hypertension   . Neuropathy   . Peripheral vascular disease   . Coronary artery disease   . Anxiety   . Depression   . Anemia   . Type II diabetes mellitus   . High cholesterol   . History of blood transfusion     "one time; related to diabetes I guess"   . Arthritis     "hands"  . GERD (gastroesophageal reflux disease)     takes Zantac  . Complication of anesthesia     pt felt like she had a hard time waking up after surgery in Feb. 2015.  Marland Kitchen CHF (congestive heart failure)    Past Surgical History  Procedure Laterality Date  . Toe amputation Right     "took off a couple toes before the amputation"  . Below knee leg amputation Right Jan. 2008  . Carotid endarterectomy Right   . Cholecystectomy    . Ankle fusion Left 05/11/2013    TIBIOCALCANEAL FUSION   . Tonsillectomy    . Dilation and curettage of uterus    . Coronary artery bypass graft  12/01/2007    "CABG X4" (05/12/2013)  . Ankle fusion Left 05/12/2013    Procedure: LEFT TIBIOCALCANEAL FUSION;  Surgeon: Newt Minion, MD;  Location: [place];  Service: Orthopedics;  Laterality: Left;  Left Tibiocalcaneal Fusion  . Colonoscopy    .  Irrigation and debridement abscess Left 06/17/2013    ANKLE           DR [name]  . I&d extremity Left 06/17/2013    Procedure: IRRIGATION AND DEBRIDEMENT EXTREMITY, PLACEMENT ANTIBIOTIC STIMULAN BEADS;  Surgeon: Newt Minion, MD;  Location: [place];  Service: Orthopedics;  Laterality: Left;  . Application of wound vac  06/17/2013    Procedure: APPLICATION OF WOUND VAC;  Surgeon: Newt Minion, MD;  Location: [place];  Service: Orthopedics;;  . Amputation Left 08/22/2013    Procedure: LEFT BELOW KNEE AMPUTATION;  Surgeon: Newt Minion, MD;   Location: [place];  Service: Orthopedics;  Laterality: Left;    Current Medication[s] Include: Medication PTA: Prescriptions prior to admission  Medication Sig Dispense Refill  . amitriptyline (ELAVIL) 25 MG tablet Take 25 mg by mouth at bedtime as needed for sleep.       Marland Kitchen aspirin 81 MG tablet Take 81 mg by mouth daily.        Marland Kitchen atorvastatin (LIPITOR) 80 MG tablet Take 80 mg by mouth daily.        . clopidogrel (PLAVIX) 75 MG tablet Take 75 mg by mouth daily.       . insulin NPH Human (HUMULIN N,NOVOLIN N) 100 UNIT/ML injection Inject 25-35 Units into the skin 2 (two) times daily before a meal. 35 units in the morning and 25 units in the evening      . metFORMIN (GLUCOPHAGE) 500 MG tablet Take 500 mg by mouth 4 (four) times daily.        . metoprolol tartrate (LOPRESSOR) 25 MG tablet Take 25 mg by mouth 2 (two) times daily.       . Multiple Vitamins-Minerals (MULTIVITAMINS THER. W/MINERALS) TABS Take 1 tablet by mouth daily.        Marland Kitchen oxyCODONE-acetaminophen (ROXICET) 5-325 MG per tablet Take 1 tablet by mouth every 4 (four) hours as needed for severe pain.  60 tablet  0  . quinapril (ACCUPRIL) 40 MG tablet Take 40 mg by mouth daily.       . ranitidine (ZANTAC) 150 MG tablet Take 150 mg by mouth 2 (two) times daily.       . sertraline (ZOLOFT) 100 MG tablet Take 100 mg by mouth 2 (two) times daily.          Scheduled:  Scheduled:  . [START ON 08/31/2013] acyclovir  10 mg/kg Intravenous Q12H  . atorvastatin  80 mg Oral Daily  . carvedilol  12.5 mg Oral BID WC  . famotidine  20 mg Oral Daily  . hydrALAZINE  50 mg Oral 3 times per day  . insulin aspart  0-20 Units Subcutaneous TID WC  . insulin NPH Human  14 Units Subcutaneous BID AC & HS  . isosorbide dinitrate  10 mg Oral BID  . levETIRAcetam  500 mg Intravenous Q12H  . potassium chloride  40 mEq Oral Once  . sertraline  100 mg Oral BID    Infusion[s]: Infusions:  . sodium chloride 30 mL/hr at 08/29/13 2259     Antibiotic[s]: Anti-infectives   Start     Dose/Rate Route Frequency Ordered Stop   08/31/13 0200  acyclovir (ZOVIRAX) 670 mg in dextrose 5 % 100 mL IVPB     10 mg/kg  67 kg 113.4 mL/hr over 60 Minutes Intravenous Every 12 hours 08/30/13 1619     08/29/13 2200  acyclovir (ZOVIRAX) 670 mg in dextrose 5 % 100 mL IVPB  Status:  Discontinued     10 mg/kg  67 kg 113.4 mL/hr over 60 Minutes Intravenous 3 times per day 08/29/13 1807 08/30/13 1619      Assessment:  58 y/o Female with PMH of DM, PVD, HTN, CAD, CABG admitted following onset of Tonic-Clonic Seizures.  MRI suspicious of Viral CNS infection.  Acyclovir started empirically.  Pharmacy asked to manage Acyclovir for suspected viral encephalitis.  Current Scr 1.3.  Estimated CrCl 43 ml/min.  WBC 5.7.  Patient is afebrile.  Goal of Therapy:   Acyclovir dose and adjusted for renal function in patient with suspected viral encephalitis. Resolution of symptoms.   Plan:  1. Will change Acyclovir to 10 mg/kg q 12 hours, adjusting for CrCl < 50 ml/min. 2. Follow up SCr, UOP, cultures, clinical course and adjust as clinically indicated.  Stramoski, Craig Guess,  Pharm.D,    6/16/20154:20 PM

## 2013-08-30 NOTE — Progress Notes (Addendum)
Pt back from MRI, pt sleeping quietly in room with call light within reach. Will continue to monitor quietly. Francis Gaines Kuffour RN.

## 2013-08-30 NOTE — Progress Notes (Signed)
Per Dr. Leonel Ramsay, platelets will not be transfused today.  Possibly tomorrow.  Blood bank notified.

## 2013-08-30 NOTE — Progress Notes (Signed)
Subjective: Considerably improved overnight.   Exam: Filed Vitals:   08/30/13 1006  BP: 157/75  Pulse: 104  Temp: 98.5 F (36.9 C)  Resp: 18   Gen: In bed, NAD MS: Awake, alert, oriented to person, place and date.  [id number], VFF Motor: 5/5 throughout.  Sensory:intact to LT  MRI - left sided T2 change  Impression: 58 yo F with second seizure in the setting of an abnormal MRI. It is unclear what these changes represent, but without significant prodromal symptoms other than fatigue for < 24 hours prior to the seizure, I am suspicious that this is a slow growing process.   Recommendations: 1) post-contrast MRI 2) Likely LP following MRI.   Roland Rack, MD [facility] [phone]  If 7pm- 7am, please page neurology on call as listed in [place].

## 2013-08-30 NOTE — Progress Notes (Signed)
[place] TEAM 1 - Stepdown/ICU TEAM Progress Note  [name] [id number] DOB: 1955/11/12 DOA: [id number] PCP: [name], MD  Admit HPI / Brief Narrative: 58 y.o. female with history of diabetes mellitus, peripheral vascular disease, hypertension, and CAD status post CABG who was recently admitted for left lower extremity osteomyelitis undergoing trans-tibial amputation was brought to the ER after patient's family noted her to be having tonic-clonic seizures and tongue biting. Patient was brought to the ER and CT head showed left-sided edema and Neurology on call was consulted. Patient was started on Keppra loading dose for possible seizures and admitted for further management. Patient has a history of seizures 8 years prior. Patient was found to be tachycardic with a rate around 140 beats per minute and blood pressure was found to be elevated in the 123456 systolic.   HPI/Subjective: Pt is sedated post MRI.  Discussed case w/ Neurology, who reports she was back to her baseline mental status this morning prior to the MRI.  Assessment/Plan:  L brain hypodensity  Initially felt to be suspicious for infiltrating mass - MRI imaging has been difficult to obtain as pt has trouble laying still - most recent images seem most c/w a viral CNS infection - LP to be carried out 6/17 after transfusion of plts (pt on ASA and Plavix at admit) - empiric acyclovir continues   Seizure Care as per Neurology - no recurrence since admission   Sinus tachycardia No PE on CTA of chest - liklely some element of BB withdrawal (metoprolol has more chronotropic effect than coreg), as well as rxn to active infection - increase coreg - TSH/FT4 normal - follow   Accelerated HTN BP not yet at goal - adjust medical tx further and follow   PVD with osteomyelitis status post left BKA 08/22/2013  CAD s/p CABG 2009  DM2 - uncontrolled - with vascular complications  CBG poorly controlled - adjust tx again today  and follow  Hypomagnesemia Repleted to normal level   Mild hypokalemia  Continue to supplement to goal of 4.0  Anemia of chronic disease  MRSA screen +  Code Status: FULL Family Communication: no family present at time of exam Disposition Plan: neuro tele bed   Consultants: Neurology  Procedures: none  Antibiotics: Acyclovir IV 6/15 >>  DVT prophylaxis: None (off anticoag due to need for LP - s/p B LE amputations)  Objective: Blood pressure 176/75, pulse 105, temperature 97.7 F (36.5 C), temperature source Oral, resp. rate 18, height 5\' 5"  (1.651 m), weight 67 kg (147 lb 11.3 oz), SpO2 95.00%.  Intake/Output Summary (Last 24 hours) at 08/30/13 1533 Last data filed at 08/29/13 2256  Gross per 24 hour  Intake    340 ml  Output    650 ml  Net   -310 ml   Exam: General: No acute respiratory distress Lungs: Clear to auscultation bilaterally without wheezes or crackles Cardiovascular: Mildly tachy rate and reg rhythm without murmur gallop or rub normal S1 and S2 Abdomen: Nontender, nondistended, soft, bowel sounds positive, no rebound, no ascites, no appreciable mass Extremities: No significant cyanosis, or clubbing  Data Reviewed: Basic Metabolic Panel:  Recent Labs Lab 08/25/13 0555 08/29/13 0111 08/29/13 0746 08/30/13 0550  NA 137 137 138 142  K 4.6 3.2* 3.4* 3.3*  CL 99 96 100 103  CO2 19 25 24 25   GLUCOSE 104* 303* 260* 227*  BUN 13 9 8 10   CREATININE 1.59* 1.17* 1.14* 1.32*  CALCIUM 8.8 8.8 8.6 8.6  MG  --  1.2*  --  1.9  PHOS  --  3.7  --   --    Liver Function Tests:  Recent Labs Lab 08/25/13 0555 08/29/13 0111 08/29/13 0746  AST 33 22 18  ALT 55* 29 25  ALKPHOS 152* 138* 125*  BILITOT 0.4 0.6 0.4  PROT 6.7 7.2 6.6  ALBUMIN 2.0* 2.3* 2.0*   CBC:  Recent Labs Lab 08/25/13 0555 08/29/13 0111 08/29/13 0746 08/30/13 0550  WBC 6.9 8.1 5.8 5.7  NEUTROABS  --  6.8 4.2  --   HGB 6.8* 8.8* 8.1* 7.9*  HCT 22.9* 28.3* 26.5* 26.4*    MCV 80.1 79.5 79.3 80.7  PLT 353 382 369 369   Cardiac Enzymes:  Recent Labs Lab 08/29/13 0111  TROPONINI <0.30   CBG:  Recent Labs Lab 08/29/13 1521 08/29/13 1831 08/29/13 2142 08/30/13 0651 08/30/13 1405  GLUCAP 172* 162* 219* 227* 220*    Recent Results (from the past 240 hour(s))  CULTURE, BLOOD (ROUTINE X 2)     Status: None   Collection Time    08/21/13  8:10 PM      Result Value Ref Range Status   Specimen Description BLOOD RIGHT ARM   Final   Special Requests BOTTLES DRAWN AEROBIC AND ANAEROBIC 10CC EA   Final   Culture  Setup Time     Final   Value: 08/22/2013 01:52     Performed at [facility]   Culture     Final   Value: NO GROWTH 5 DAYS     Performed at [facility]   Report Status 08/28/2013 FINAL   Final  CULTURE, BLOOD (ROUTINE X 2)     Status: None   Collection Time    08/21/13  8:20 PM      Result Value Ref Range Status   Specimen Description BLOOD RIGHT HAND   Final   Special Requests BOTTLES DRAWN AEROBIC ONLY 10CC   Final   Culture  Setup Time     Final   Value: 08/22/2013 01:52     Performed at [facility]   Culture     Final   Value: NO GROWTH 5 DAYS     Performed at [facility]   Report Status 08/28/2013 FINAL   Final  MRSA PCR SCREENING     Status: Abnormal   Collection Time    08/22/13  4:49 AM      Result Value Ref Range Status   MRSA by PCR POSITIVE (*) NEGATIVE Final   Comment:            The GeneXpert MRSA Assay (FDA     approved for NASAL specimens     only), is one component of a     comprehensive MRSA colonization     surveillance program. It is not     intended to diagnose MRSA     infection nor to guide or     monitor treatment for     MRSA infections.     RESULT CALLED TO, READ BACK BY AND VERIFIED WITH:     [facility] RN [id number] AT [phone] SKEEN,P  CULTURE, BLOOD (ROUTINE X 2)     Status: None   Collection Time    08/29/13  1:06 AM      Result Value Ref Range Status   Specimen  Description BLOOD RIGHT ARM   Final   Special Requests BOTTLES DRAWN AEROBIC AND ANAEROBIC [place]  Final   Culture  Setup Time     Final   Value: 08/29/2013 04:13     Performed at [facility]   Culture     Final   Value:        BLOOD CULTURE RECEIVED NO GROWTH TO DATE CULTURE WILL BE HELD FOR 5 DAYS BEFORE ISSUING A FINAL NEGATIVE REPORT     Performed at [facility]   Report Status PENDING   Incomplete  CULTURE, BLOOD (ROUTINE X 2)     Status: None   Collection Time    08/29/13  1:11 AM      Result Value Ref Range Status   Specimen Description BLOOD RIGHT WRIST   Final   Special Requests BOTTLES DRAWN AEROBIC ONLY 5CC   Final   Culture  Setup Time     Final   Value: 08/29/2013 04:13     Performed at [facility]   Culture     Final   Value:        BLOOD CULTURE RECEIVED NO GROWTH TO DATE CULTURE WILL BE HELD FOR 5 DAYS BEFORE ISSUING A FINAL NEGATIVE REPORT     Performed at [facility]   Report Status PENDING   Incomplete  MRSA PCR SCREENING     Status: None   Collection Time    08/29/13  3:42 AM      Result Value Ref Range Status   MRSA by PCR NEGATIVE  NEGATIVE Final   Comment:            The GeneXpert MRSA Assay (FDA     approved for NASAL specimens     only), is one component of a     comprehensive MRSA colonization     surveillance program. It is not     intended to diagnose MRSA     infection nor to guide or     monitor treatment for     MRSA infections.    Studies:  Recent x-ray studies have been reviewed in detail by the Attending Physician  Scheduled Meds:  Scheduled Meds: . acyclovir  10 mg/kg Intravenous 3 times per day  . atorvastatin  80 mg Oral Daily  . carvedilol  6.25 mg Oral BID WC  . famotidine  20 mg Oral Daily  . hydrALAZINE  25 mg Oral 3 times per day  . insulin aspart  0-20 Units Subcutaneous TID WC  . isosorbide dinitrate  5 mg Oral BID  . levETIRAcetam  500 mg Intravenous Q12H  . sertraline  100 mg  Oral BID    Time spent on care of this patient: 35 mins   MCCLUNG,JEFFREY T , MD   [facility] Office  [phone] Pager - Text Page per Shea Evans as per below:  On-Call/Text Page:      [email or web address]      password [email or web address]  If 7PM-7AM, please contact night-coverage [email or web address] Password [email or web address] 08/30/2013, 3:33 PM   LOS: 2 days

## 2013-08-31 ENCOUNTER — Inpatient Hospital Stay (HOSPITAL_COMMUNITY): Payer: Medicare Other

## 2013-08-31 LAB — GRAM STAIN

## 2013-08-31 LAB — GLUCOSE, CAPILLARY
GLUCOSE-CAPILLARY: 171 mg/dL — AB (ref 70–99)
Glucose-Capillary: 146 mg/dL — ABNORMAL HIGH (ref 70–99)
Glucose-Capillary: 188 mg/dL — ABNORMAL HIGH (ref 70–99)
Glucose-Capillary: 125 mg/dL — ABNORMAL HIGH (ref 70–99)

## 2013-08-31 LAB — BASIC METABOLIC PANEL
BUN: 10 mg/dL (ref 6–23)
CALCIUM: 9.4 mg/dL (ref 8.4–10.5)
CO2: 27 mEq/L (ref 19–32)
Chloride: 101 mEq/L (ref 96–112)
Creatinine, Ser: 1.18 mg/dL — ABNORMAL HIGH (ref 0.50–1.10)
GFR calc Af Amer: 59 mL/min — ABNORMAL LOW (ref >90)
GFR, EST NON AFRICAN AMERICAN: 51 mL/min — AB (ref >90)
Glucose, Bld: 169 mg/dL — ABNORMAL HIGH (ref 70–99)
Potassium: 3.6 mEq/L — ABNORMAL LOW (ref 3.7–5.3)
Sodium: 141 mEq/L (ref 137–147)

## 2013-08-31 LAB — CSF CELL COUNT WITH DIFFERENTIAL
RBC Count, CSF: 17 /mm3 — ABNORMAL HIGH
Tube #: 1
WBC, CSF: 1 /mm3 (ref 0–5)

## 2013-08-31 LAB — CBC
HCT: 29.5 % — ABNORMAL LOW (ref 36.0–46.0)
HEMOGLOBIN: 8.8 g/dL — AB (ref 12.0–15.0)
MCH: 24 pg — AB (ref 26.0–34.0)
MCHC: 29.8 g/dL — AB (ref 30.0–36.0)
MCV: 80.6 fL (ref 78.0–100.0)
Platelets: 432 10*3/uL — ABNORMAL HIGH (ref 150–400)
RBC: 3.66 MIL/uL — AB (ref 3.87–5.11)
RDW: 18.2 % — ABNORMAL HIGH (ref 11.5–15.5)
WBC: 6.6 10*3/uL (ref 4.0–10.5)

## 2013-08-31 LAB — PROTEIN AND GLUCOSE, CSF
Glucose, CSF: 82 mg/dL — ABNORMAL HIGH (ref 43–76)
Total  Protein, CSF: 29 mg/dL (ref 15–45)

## 2013-08-31 MED ORDER — HEPARIN SODIUM (PORCINE) 5000 UNIT/ML IJ SOLN
5000.0000 [IU] | Freq: Three times a day (TID) | INTRAMUSCULAR | Status: DC
  Administered 2013-09-01: 5000 [IU] via SUBCUTANEOUS
  Filled 2013-08-31 (×4): qty 1

## 2013-08-31 MED ORDER — CARVEDILOL 25 MG PO TABS
25.0000 mg | ORAL_TABLET | Freq: Two times a day (BID) | ORAL | Status: DC
  Administered 2013-08-31 – 2013-09-01 (×2): 25 mg via ORAL
  Filled 2013-08-31 (×4): qty 1

## 2013-08-31 MED ORDER — ASPIRIN 81 MG PO CHEW
81.0000 mg | CHEWABLE_TABLET | Freq: Every day | ORAL | Status: DC
  Administered 2013-09-01: 81 mg via ORAL
  Filled 2013-08-31: qty 1

## 2013-08-31 MED ORDER — CLOPIDOGREL BISULFATE 75 MG PO TABS
75.0000 mg | ORAL_TABLET | Freq: Every day | ORAL | Status: DC
  Administered 2013-09-01: 75 mg via ORAL
  Filled 2013-08-31: qty 1

## 2013-08-31 NOTE — Procedures (Signed)
.  Procedure:  Lumbar Puncture with FLUORO guidance. Specimen: CSF, to lab Bleeding: minimal. Complications: None immediate. Patient   -Condition: Stable.  -Disposition:  To floor for continued care.  Full Radiology report to follow under IMAGING

## 2013-08-31 NOTE — Progress Notes (Signed)
Progress Note  [name] [id number] DOB: Aug 12, 1955 DOA: [id number] PCP: [name], MD    Admit HPI / Brief Narrative: 58 y.o. female with history of diabetes mellitus, peripheral vascular disease, hypertension, and CAD status post CABG who was recently admitted for left lower extremity osteomyelitis undergoing trans-tibial amputation was brought to the ER after patient's family noted her to be having tonic-clonic seizures and tongue biting. Patient was brought to the ER and CT head showed left-sided edema and Neurology on call was consulted. Patient was started on Keppra loading dose for possible seizures and admitted for further management. Patient has a history of seizures 8 years prior. Patient was found to be tachycardic with a rate around 140 beats per minute and blood pressure was found to be elevated in the 123456 systolic.     HPI/Subjective:  Patient in bed, denies any headache chest or abdominal pain. No new focal weakness. No confusion    Assessment/Plan:  L brain hypodensity  Initially felt to be suspicious for infiltrating mass on CT scan. MRI now limited due to motion this has been an issue in the past as well, patient has history of involuntary movement, currently on acyclovir for suspected viral infection, LP to be done by IR on 08/31/2013.      Seizure Care as per Neurology - no recurrence since admission      Sinus tachycardia No PE on CTA of chest - liklely some element of BB withdrawal was on meta-prolonged, Coreg dose adjusted, TSH stable.     Accelerated HTN BP not yet at goal - adjust medical tx further and follow      PVD with osteomyelitis status post left BKA 08/22/2013  No acute issues.     CAD s/p CABG 2009 Symptom-free on Imdur, hydralazine, Coreg and statin for secondary prevention. Antiplatelets on hold for LP     DM2 - uncontrolled - with vascular complications  CBG poorly controlled - on sliding scale along with  Lantus  Lab Results  Component Value Date   HGBA1C 7.3* 08/29/2013    CBG (last 3)   Recent Labs  08/30/13 1632 08/30/13 2111 08/31/13 0659  GLUCAP 201* 119* 146*      Hypomagnesemia Repleted to normal level     Mild hypokalemia  Placed in stable     Anemia of chronic disease No acute issues monitor.      Code Status: FULL Family Communication: no family present at time of exam Disposition Plan: neuro tele bed     Consultants: Neurology   Procedures: CT head, MRI brain, LP due on 08/31/2013    Antibiotics: Acyclovir IV 6/15 >>     DVT prophylaxis:  Bilateral BKA so no STDs, will commence heparin tomorrow after LP today.    Objective: Blood pressure 180/93, pulse 112, temperature 97 F (36.1 C), temperature source Oral, resp. rate 18, height 5\' 5"  (1.651 m), weight 67 kg (147 lb 11.3 oz), SpO2 97.00%.  Intake/Output Summary (Last 24 hours) at 08/31/13 1052 Last data filed at 08/31/13 0900  Gross per 24 hour  Intake    360 ml  Output    600 ml  Net   -240 ml   Exam: General: No acute respiratory distress Lungs: Clear to auscultation bilaterally without wheezes or crackles Cardiovascular: Mildly tachy rate and reg rhythm without murmur gallop or rub normal S1 and S2 Abdomen: Nontender, nondistended, soft, bowel sounds positive, no rebound, no ascites, no appreciable mass Extremities: No significant cyanosis, or clubbing,  Bilat BKA  Data Reviewed: Basic Metabolic Panel:  Recent Labs Lab 08/25/13 0555 08/29/13 0111 08/29/13 0746 08/30/13 0550 08/31/13 0440  NA 137 137 138 142 141  K 4.6 3.2* 3.4* 3.3* 3.6*  CL 99 96 100 103 101  CO2 19 25 24 25 27   GLUCOSE 104* 303* 260* 227* 169*  BUN 13 9 8 10 10   CREATININE 1.59* 1.17* 1.14* 1.32* 1.18*  CALCIUM 8.8 8.8 8.6 8.6 9.4  MG  --  1.2*  --  1.9  --   PHOS  --  3.7  --   --   --    Liver Function Tests:  Recent Labs Lab 08/25/13 0555 08/29/13 0111 08/29/13 0746  AST 33  22 18  ALT 55* 29 25  ALKPHOS 152* 138* 125*  BILITOT 0.4 0.6 0.4  PROT 6.7 7.2 6.6  ALBUMIN 2.0* 2.3* 2.0*   CBC:  Recent Labs Lab 08/25/13 0555 08/29/13 0111 08/29/13 0746 08/30/13 0550 08/31/13 0440  WBC 6.9 8.1 5.8 5.7 6.6  NEUTROABS  --  6.8 4.2  --   --   HGB 6.8* 8.8* 8.1* 7.9* 8.8*  HCT 22.9* 28.3* 26.5* 26.4* 29.5*  MCV 80.1 79.5 79.3 80.7 80.6  PLT 353 382 369 369 432*   Cardiac Enzymes:  Recent Labs Lab 08/29/13 0111  TROPONINI <0.30   CBG:  Recent Labs Lab 08/30/13 0651 08/30/13 1405 08/30/13 1632 08/30/13 2111 08/31/13 0659  GLUCAP 227* 220* 201* 119* 146*    Recent Results (from the past 240 hour(s))  CULTURE, BLOOD (ROUTINE X 2)     Status: None   Collection Time    08/21/13  8:10 PM      Result Value Ref Range Status   Specimen Description BLOOD RIGHT ARM   Final   Special Requests BOTTLES DRAWN AEROBIC AND ANAEROBIC 10CC EA   Final   Culture  Setup Time     Final   Value: 08/22/2013 01:52     Performed at [facility]   Culture     Final   Value: NO GROWTH 5 DAYS     Performed at [facility]   Report Status 08/28/2013 FINAL   Final  CULTURE, BLOOD (ROUTINE X 2)     Status: None   Collection Time    08/21/13  8:20 PM      Result Value Ref Range Status   Specimen Description BLOOD RIGHT HAND   Final   Special Requests BOTTLES DRAWN AEROBIC ONLY 10CC   Final   Culture  Setup Time     Final   Value: 08/22/2013 01:52     Performed at [facility]   Culture     Final   Value: NO GROWTH 5 DAYS     Performed at [facility]   Report Status 08/28/2013 FINAL   Final  MRSA PCR SCREENING     Status: Abnormal   Collection Time    08/22/13  4:49 AM      Result Value Ref Range Status   MRSA by PCR POSITIVE (*) NEGATIVE Final   Comment:            The GeneXpert MRSA Assay (FDA     approved for NASAL specimens     only), is one component of a     comprehensive MRSA colonization     surveillance program. It  is not     intended to diagnose MRSA     infection nor  to guide or     monitor treatment for     MRSA infections.     RESULT CALLED TO, READ BACK BY AND VERIFIED WITH:     [facility] RN [id number] AT [phone] SKEEN,P  CULTURE, BLOOD (ROUTINE X 2)     Status: None   Collection Time    08/29/13  1:06 AM      Result Value Ref Range Status   Specimen Description BLOOD RIGHT ARM   Final   Special Requests BOTTLES DRAWN AEROBIC AND ANAEROBIC 10CC EACH   Final   Culture  Setup Time     Final   Value: 08/29/2013 04:13     Performed at [facility]   Culture     Final   Value:        BLOOD CULTURE RECEIVED NO GROWTH TO DATE CULTURE WILL BE HELD FOR 5 DAYS BEFORE ISSUING A FINAL NEGATIVE REPORT     Performed at [facility]   Report Status PENDING   Incomplete  CULTURE, BLOOD (ROUTINE X 2)     Status: None   Collection Time    08/29/13  1:11 AM      Result Value Ref Range Status   Specimen Description BLOOD RIGHT WRIST   Final   Special Requests BOTTLES DRAWN AEROBIC ONLY 5CC   Final   Culture  Setup Time     Final   Value: 08/29/2013 04:13     Performed at [facility]   Culture     Final   Value:        BLOOD CULTURE RECEIVED NO GROWTH TO DATE CULTURE WILL BE HELD FOR 5 DAYS BEFORE ISSUING A FINAL NEGATIVE REPORT     Performed at [facility]   Report Status PENDING   Incomplete  MRSA PCR SCREENING     Status: None   Collection Time    08/29/13  3:42 AM      Result Value Ref Range Status   MRSA by PCR NEGATIVE  NEGATIVE Final   Comment:            The GeneXpert MRSA Assay (FDA     approved for NASAL specimens     only), is one component of a     comprehensive MRSA colonization     surveillance program. It is not     intended to diagnose MRSA     infection nor to guide or     monitor treatment for     MRSA infections.      Scheduled Meds:  Scheduled Meds: . acyclovir  10 mg/kg Intravenous Q12H  . atorvastatin  80 mg Oral Daily  . carvedilol  12.5 mg  Oral BID WC  . famotidine  20 mg Oral Daily  . hydrALAZINE  50 mg Oral 3 times per day  . insulin aspart  0-20 Units Subcutaneous TID WC  . insulin NPH Human  14 Units Subcutaneous BID AC & HS  . isosorbide dinitrate  10 mg Oral BID  . levETIRAcetam  500 mg Intravenous Q12H  . sertraline  100 mg Oral BID    Time spent on care of this patient: 35 mins  Lala Lund K M.D on 08/31/2013 at 10:54 AM  Between 7am to 7pm - Pager - [phone], After 7pm go to [email or web address] - password [email or web address]  And look for the night coverage person covering me after hours  [place]   LOS: 3 days

## 2013-09-01 LAB — GLUCOSE, CAPILLARY
GLUCOSE-CAPILLARY: 138 mg/dL — AB (ref 70–99)
Glucose-Capillary: 189 mg/dL — ABNORMAL HIGH (ref 70–99)

## 2013-09-01 LAB — PREPARE PLATELET PHERESIS: UNIT DIVISION: 0

## 2013-09-01 LAB — HERPES SIMPLEX VIRUS(HSV) DNA BY PCR
HSV 1 DNA: NOT DETECTED
HSV 2 DNA: NOT DETECTED

## 2013-09-01 MED ORDER — HYDRALAZINE HCL 50 MG PO TABS: 50.0000 mg | ORAL_TABLET | Freq: Three times a day (TID) | ORAL | Status: DC

## 2013-09-01 MED ORDER — LEVETIRACETAM 500 MG PO TABS: 500.0000 mg | ORAL_TABLET | Freq: Two times a day (BID) | ORAL | Status: DC

## 2013-09-01 MED ORDER — AMLODIPINE 1 MG/ML ORAL SUSPENSION: 10.0000 mg | Freq: Every day | ORAL | Status: DC

## 2013-09-01 MED ORDER — AMLODIPINE BESYLATE 10 MG PO TABS: 10.0000 mg | ORAL_TABLET | Freq: Every day | ORAL | Status: DC

## 2013-09-01 MED ORDER — DEXTROSE 5 % IV SOLN
570.0000 mg | Freq: Two times a day (BID) | INTRAVENOUS | Status: DC
  Filled 2013-09-01 (×2): qty 11.4

## 2013-09-01 MED ORDER — METFORMIN HCL 500 MG PO TABS: 500.0000 mg | ORAL_TABLET | Freq: Four times a day (QID) | ORAL | Status: DC

## 2013-09-01 MED ORDER — ISOSORBIDE MONONITRATE ER 30 MG PO TB24: 30.0000 mg | ORAL_TABLET | Freq: Every day | ORAL | Status: DC

## 2013-09-01 MED ORDER — METOPROLOL TARTRATE 50 MG PO TABS: 25.0000 mg | ORAL_TABLET | Freq: Two times a day (BID) | ORAL | Status: DC

## 2013-09-01 NOTE — Progress Notes (Signed)
Talked to patient with family members present about DCP; Noted that patient was going to [place] last week but changed her mind and decided to leave the hospital AMA; Patient stated " I just didn't want to go to rehab and wanted to go home; CM offered HHC, patient refused all services. CM informed patient that if she changed her mind and wanted [facility] services after discharge to call her PCP and that can be arranged from his office; Lyda Jester, [place]

## 2013-09-01 NOTE — Progress Notes (Signed)
NEURO HOSPITALIST PROGRESS NOTE   SUBJECTIVE:                                                                                                                        Doing much better. No further seizures. Offers no neurological complains. CSF unimpressive so far. Remains on keppra 500 mg BID without reported side effects.  OBJECTIVE:                                                                                                                           Vital signs in last 24 hours: Temp:  [98 F (36.7 C)-99.2 F (37.3 C)] 98.3 F (36.8 C) (06/18 0426) Pulse Rate:  [88-106] 105 (06/18 0439) Resp:  [16-18] 18 (06/18 0426) BP: (119-221)/(58-111) 182/91 mmHg (06/18 0439) SpO2:  [96 %-100 %] 99 % (06/18 0426)  Intake/Output from previous day: 06/17 0701 - 06/18 0700 In: 998 [P.O.:480; I.V.:300; Blood:218] Out: -  Intake/Output this shift:   Nutritional status:    Past Medical History  Diagnosis Date  . Hypertension   . Neuropathy   . Peripheral vascular disease   . Coronary artery disease   . Anxiety   . Depression   . Anemia   . Type II diabetes mellitus   . High cholesterol   . History of blood transfusion     "one time; related to diabetes I guess"   . Arthritis     "hands"  . GERD (gastroesophageal reflux disease)     takes Zantac  . Complication of anesthesia     pt felt like she had a hard time waking up after surgery in Feb. 2015.  Marland Kitchen CHF (congestive heart failure)     Neurologic Exam:  MS: Awake, alert, oriented to person, place and date.  PA:[id number], VFF  Motor: 5/5 throughout.  Sensory:intact to LT      Lab Results: Lab Results  Component Value Date/Time   CHOL 113 08/30/2013  5:50 AM   Lipid Panel  Recent Labs  08/30/13 0550  CHOL 113  TRIG 149  HDL 36*  CHOLHDL 3.1  VLDL 30  LDLCALC 47    Studies/Results: Mr Jeri Cos Contrast  08/30/2013   ADDENDUM REPORT: 08/30/2013 14:26  ADDENDUM:  Study  reviewed in person with Dr. Roland Rack on 08/30/2013 at 1355 hrs.  We discussed that the left ICA flow void has been absent since 2009. Also, the patient has not had convincing symptoms of left MCA territory ischemia.  Leading differential consideration based on clinical and imaging constellation at this point is viral CNS infection.  Lumbar puncture for CSF analysis will be pursued, probably following platelet transfusion since the patient is currently on aspirin and Plavix.   Electronically Signed   By: Lars Pinks M.D.   On: 08/30/2013 14:26   08/30/2013   CLINICAL DATA:  58 year old female presents for post-contrast imaging to evaluate infiltrative left frontal lobe mass. Loss of consciousness with seizure-like activity.  EXAM: MRI HEAD WITH CONTRAST  TECHNIQUE: Multiplanar, multiecho pulse sequences of the brain and surrounding structures were obtained with intravenous contrast.  COMPARISON:  Brain MRI without contrast 08/29/2013. Head CT 08/29/2013. Brain MRI 10/05/2007.  CONTRAST:  98mL MULTIHANCE GADOBENATE DIMEGLUMINE 529 MG/ML IV SOLN  FINDINGS: Study is intermittently degraded by motion artifact despite repeated imaging attempts.  Diffusion imaging was repeated, but is of lesser quality than that done yesterday which demonstrated facilitated diffusion in the affected left frontal lobe and corona radiata.  Confluent white matter T2 and FLAIR hyperintensity persists in the same pattern as demonstrated yesterday.  Also there is asymmetric visualization on FLAIR of some left MCA branches about the operculum.  The area of involvement was hypo intense on noncontrast T1 weighted imaging. On today's post-contrast T1 weighted imaging no enhancement is evident.  No abnormal enhancement elsewhere in the brain.  IMPRESSION: 1. Motion degraded exam despite repeated imaging attempts, image quality less than that obtained on yesterday's study. 2. No convincing enhancement to correspond to the left frontal lobe  lesion. 3. Suggestion of abnormal flow in the left MCA branches. Recommend followup head and neck CTA (considering that MRA is unlikely to be successful with the patient's degree of motion).  Electronically Signed: By: Lars Pinks M.D. On: 08/30/2013 13:48   Dg Fluoro Guide Lumbar Puncture  08/31/2013   CLINICAL DATA:  58 year old female with altered mental status, seizure, abnormal brain MRI. Diagnostic lumbar puncture for CSF analysis requested under fluoroscopic guidance due to the patient being on Plavix. Initial encounter.  EXAM: DIAGNOSTIC LUMBAR PUNCTURE UNDER FLUOROSCOPIC GUIDANCE  FLUOROSCOPY TIME:  0 min and 21 seconds.  PROCEDURE: The patient underwent platelet transfusion prior to the procedure.  Informed consent was obtained from the patient and her son prior to the procedure, including potential complications of headache, allergy, and pain. A "time-out" was performed.  With the patient prone, the lower back was prepped with Betadine. 1% Lidocaine was used for local anesthesia. Lumbar puncture was performed at the L4-L5 level using right sub laminar technique, a 3.5 inch x 20 gauge needle with return of clear, colorless CSF with an opening pressure of 17 cm water. 87ml of CSF were obtained for laboratory studies.  The needle was withdrawn, direct pressure was held, and hemostasis was noted. Appropriate postprocedural orders were placed. The patient tolerated the procedure well and there were no apparent complications.  IMPRESSION: Diagnostic lumbar puncture at L4-L5. Opening pressure 17 cm of water. Clear, colorless CSF sent to the lab for analysis.   Electronically Signed   By: Lars Pinks M.D.   On: 08/31/2013 16:30    MEDICATIONS  Scheduled: . acyclovir  570 mg Intravenous Q12H  . aspirin  81 mg Oral Daily  . atorvastatin  80 mg Oral Daily  . carvedilol  25 mg Oral BID WC  .  clopidogrel  75 mg Oral Daily  . famotidine  20 mg Oral Daily  . heparin subcutaneous  5,000 Units Subcutaneous 3 times per day  . hydrALAZINE  50 mg Oral 3 times per day  . insulin aspart  0-20 Units Subcutaneous TID WC  . insulin NPH Human  14 Units Subcutaneous BID AC & HS  . isosorbide dinitrate  10 mg Oral BID  . levETIRAcetam  500 mg Intravenous Q12H  . sertraline  100 mg Oral BID    ASSESSMENT/PLAN:                                                                                                            58 yo F with second seizure in the setting of an abnormal MRI. It is unclear what these MRI changes represent but CSF doesn't seem to be compatible with infection. Cytology pending. Patient to be discharge home today. We already scheduled an outpatient neurology follow up visit at [facility] neurology with Dr. Delice Lesch to discuss final results of cytology CSF, need for further neuro-imaging, and seizure management. Will sign off.  Dorian Pod, MD [facility] [phone]  09/01/2013, 10:03 AM

## 2013-09-01 NOTE — Progress Notes (Signed)
Pt A&O x4; pt discharge education and instructions completed with pt and family at bedside. All voices understanding and denies any questions. Pt IV and telemetry removed; pt handed her prescription order for Lopressor; hydralyzine; keppra and isosorbride. Pt refused her 1400 medication d/t being discharge. Pt discharge home and pt transported off unit via wheelchair with belongings and sons at side. Pt sons to transport pt off to disposition. Francis Gaines Chancey Ringel RN.

## 2013-09-01 NOTE — Progress Notes (Signed)
After cleaning patient assessed vital signs and BP was 221/111 gave here PRN 10 mg hydralazine BP went down to 182/91.  Patient also constantly removing telly leads and attempting to pull out IV so mittens were put on. Will continue to monitor

## 2013-09-01 NOTE — Discharge Summary (Addendum)
[name], is a 58 y.o. female  DOB [birth year]  MRN [id number].  Admission date:  08/28/2013  Admitting Physician  Rise Patience, MD  Discharge Date:  09/01/2013   Primary MD  [name], MD  Recommendations for primary care physician for things to follow:   Monitor blood pressure, glycemic control outpatient, please make sure patient follows with recommended neurologist in a timely fashion   Admission Diagnosis  Type II or unspecified type diabetes mellitus without mention of complication, not stated as uncontrolled [250.00] Seizure [780.39] Hypertensive emergency [401.9]   Discharge Diagnosis  Type II or unspecified type diabetes mellitus without mention of complication, not stated as uncontrolled [250.00] Seizure [780.39] Hypertensive emergency [401.9]     Principal Problem:   Seizure Active Problems:   DIABETES MELLITUS   PVD (peripheral vascular disease)   Accelerated hypertension      Past Medical History  Diagnosis Date  . Hypertension   . Neuropathy   . Peripheral vascular disease   . Coronary artery disease   . Anxiety   . Depression   . Anemia   . Type II diabetes mellitus   . High cholesterol   . History of blood transfusion     "one time; related to diabetes I guess"   . Arthritis     "hands"  . GERD (gastroesophageal reflux disease)     takes Zantac  . Complication of anesthesia     pt felt like she had a hard time waking up after surgery in Feb. 2015.  Marland Kitchen CHF (congestive heart failure)     Past Surgical History  Procedure Laterality Date  . Toe amputation Right     "took off a couple toes before the amputation"  . Below knee leg amputation Right Jan. 2008  . Carotid endarterectomy Right   . Cholecystectomy    . Ankle fusion Left 05/11/2013    TIBIOCALCANEAL FUSION   .  Tonsillectomy    . Dilation and curettage of uterus    . Coronary artery bypass graft  12/01/2007    "CABG X4" (05/12/2013)  . Ankle fusion Left 05/12/2013    Procedure: LEFT TIBIOCALCANEAL FUSION;  Surgeon: Newt Minion, MD;  Location: [facility];  Service: Orthopedics;  Laterality: Left;  Left Tibiocalcaneal Fusion  . Colonoscopy    . Irrigation and debridement abscess Left 06/17/2013    ANKLE           DR [name]  . I&d extremity Left 06/17/2013    Procedure: IRRIGATION AND DEBRIDEMENT EXTREMITY, PLACEMENT ANTIBIOTIC STIMULAN BEADS;  Surgeon: Newt Minion, MD;  Location: [facility];  Service: Orthopedics;  Laterality: Left;  . Application of wound vac  06/17/2013    Procedure: APPLICATION OF WOUND VAC;  Surgeon: Newt Minion, MD;  Location: [facility];  Service: Orthopedics;;  . Amputation Left 08/22/2013    Procedure: LEFT BELOW KNEE AMPUTATION;  Surgeon: Newt Minion, MD;  Location: [facility];  Service: Orthopedics;  Laterality: Left;     Discharge Condition:  Stable   Follow UP  Follow-up Information   Follow up with Cameron Sprang, MD On 09/14/2013. (230 appointment)    Specialty:  Neurology   Contact information:   [address] [place] [address] [phone]       Follow up with [name], MD. Schedule an appointment as soon as possible for a visit in 1 week.   Specialty:  Family Medicine        Discharge Instructions  and  Discharge Medications        Discharge Instructions   Diet - low sodium heart healthy    Complete by:  As directed      Discharge instructions    Complete by:  As directed   Follow with Primary MD [name], MD in 7 days   Get CBC, CMP  checked  by Primary MD next visit.    Activity: As tolerated with Full fall precautions use walker/cane & assistance as needed   Disposition Home     Diet: Heart Healthy Low carb  Accuchecks 4 times/day, Once in AM empty stomach and then before each meal. Log in all results and show them to your  Prim.MD in 3 days. If any glucose reading is under 80 or above 300 call your Prim MD immidiately. Follow Low glucose instructions for glucose under 80 as instructed.  .  For Heart failure patients - Check your Weight same time everyday, if you gain over 2 pounds, or you develop in leg swelling, experience more shortness of breath or chest pain, call your Primary MD immediately. Follow Cardiac Low Salt Diet and 1.8 lit/day fluid restriction.   On your next visit with her primary care physician please Get Medicines reviewed and adjusted.  Please request your Prim.MD to go over all Hospital Tests and Procedure/Radiological results at the follow up, please get all Hospital records sent to your Prim MD by signing hospital release before you go home.   If you experience worsening of your admission symptoms, develop shortness of breath, life threatening emergency, suicidal or homicidal thoughts you must seek medical attention immediately by calling 911 or calling your MD immediately  if symptoms less severe.  You Must read complete instructions/literature along with all the possible adverse reactions/side effects for all the Medicines you take and that have been prescribed to you. Take any new Medicines after you have completely understood and accpet all the possible adverse reactions/side effects.   Do not drive and provide baby sitting services if your were admitted for syncope or siezures until you have seen by Primary MD or a Neurologist and advised to do so again.  Do not drive when taking Pain medications.    Do not take more than prescribed Pain, Sleep and Anxiety Medications  Special Instructions: If you have smoked or chewed Tobacco  in the last 2 yrs please stop smoking, stop any regular Alcohol  and or any Recreational drug use.  Wear Seat belts while driving.   Please note  You were cared for by a hospitalist during your hospital stay. If you have any questions about your  discharge medications or the care you received while you were in the hospital after you are discharged, you can call the unit and asked to speak with the hospitalist on call if the hospitalist that took care of you is not available. Once you are discharged, your primary care physician will handle any further medical issues. Please note that NO REFILLS for any discharge medications will  be authorized once you are discharged, as it is imperative that you return to your primary care physician (or establish a relationship with a primary care physician if you do not have one) for your aftercare needs so that they can reassess your need for medications and monitor your lab values.  Follow with Primary MD [name], MD in 7 days   Get CBC, CMP  checked  by Primary MD next visit.    Activity: As tolerated with Full fall precautions use walker/cane & assistance as needed   Disposition Home     Diet: Heart Healthy Low carb  Accuchecks 4 times/day, Once in AM empty stomach and then before each meal. Log in all results and show them to your Prim.MD in 3 days. If any glucose reading is under 80 or above 300 call your Prim MD immidiately. Follow Low glucose instructions for glucose under 80 as instructed.  .  For Heart failure patients - Check your Weight same time everyday, if you gain over 2 pounds, or you develop in leg swelling, experience more shortness of breath or chest pain, call your Primary MD immediately. Follow Cardiac Low Salt Diet and 1.8 lit/day fluid restriction.   On your next visit with her primary care physician please Get Medicines reviewed and adjusted.  Please request your Prim.MD to go over all Hospital Tests and Procedure/Radiological results at the follow up, please get all Hospital records sent to your Prim MD by signing hospital release before you go home.   If you experience worsening of your admission symptoms, develop shortness of breath, life threatening emergency,  suicidal or homicidal thoughts you must seek medical attention immediately by calling 911 or calling your MD immediately  if symptoms less severe.  You Must read complete instructions/literature along with all the possible adverse reactions/side effects for all the Medicines you take and that have been prescribed to you. Take any new Medicines after you have completely understood and accpet all the possible adverse reactions/side effects.   Do not drive and provide baby sitting services if your were admitted for syncope or siezures until you have seen by Primary MD or a Neurologist and advised to do so again.  Do not drive when taking Pain medications.    Do not take more than prescribed Pain, Sleep and Anxiety Medications  Special Instructions: If you have smoked or chewed Tobacco  in the last 2 yrs please stop smoking, stop any regular Alcohol  and or any Recreational drug use.  Wear Seat belts while driving.   Please note  You were cared for by a hospitalist during your hospital stay. If you have any questions about your discharge medications or the care you received while you were in the hospital after you are discharged, you can call the unit and asked to speak with the hospitalist on call if the hospitalist that took care of you is not available. Once you are discharged, your primary care physician will handle any further medical issues. Please note that NO REFILLS for any discharge medications will be authorized once you are discharged, as it is imperative that you return to your primary care physician (or establish a relationship with a primary care physician if you do not have one) for your aftercare needs so that they can reassess your need for medications and monitor your lab values.     Increase activity slowly    Complete by:  As directed  Medication List         amitriptyline 25 MG tablet  Commonly known as:  ELAVIL  Take 25 mg by mouth at bedtime as needed for  sleep.     aspirin 81 MG tablet  Take 81 mg by mouth daily.     atorvastatin 80 MG tablet  Commonly known as:  LIPITOR  Take 80 mg by mouth daily.     clopidogrel 75 MG tablet  Commonly known as:  PLAVIX  Take 75 mg by mouth daily.     hydrALAZINE 50 MG tablet  Commonly known as:  APRESOLINE  Take 1 tablet (50 mg total) by mouth every 8 (eight) hours.     insulin NPH Human 100 UNIT/ML injection  Commonly known as:  HUMULIN N,NOVOLIN N  Inject 25-35 Units into the skin 2 (two) times daily before a meal. 35 units in the morning and 25 units in the evening     isosorbide mononitrate 30 MG 24 hr tablet  Commonly known as:  IMDUR  Take 1 tablet (30 mg total) by mouth daily.     levETIRAcetam 500 MG tablet  Commonly known as:  KEPPRA  Take 1 tablet (500 mg total) by mouth 2 (two) times daily.     metFORMIN 500 MG tablet  Commonly known as:  GLUCOPHAGE  Take 1 tablet (500 mg total) by mouth 4 (four) times daily.  Start taking on:  09/02/2013     metoprolol 50 MG tablet  Commonly known as:  LOPRESSOR  Take 0.5 tablets (25 mg total) by mouth 2 (two) times daily.     multivitamins ther. w/minerals Tabs tablet  Take 1 tablet by mouth daily.     oxyCODONE-acetaminophen 5-325 MG per tablet  Commonly known as:  ROXICET  Take 1 tablet by mouth every 4 (four) hours as needed for severe pain.     quinapril 40 MG tablet  Commonly known as:  ACCUPRIL  Take 40 mg by mouth daily.     ranitidine 150 MG tablet  Commonly known as:  ZANTAC  Take 150 mg by mouth 2 (two) times daily.     sertraline 100 MG tablet  Commonly known as:  ZOLOFT  Take 100 mg by mouth 2 (two) times daily.          Diet and Activity recommendation: See Discharge Instructions above   Consults obtained - neurology, IR for LP   Major procedures and Radiology Reports - PLEASE review detailed and final reports for all details, in brief -   LP. Preliminary below not consistent with infection.   Dg  Tibia/fibula Left  08/21/2013   CLINICAL DATA:  Swelling and open sores in the distal lower leg. Rule out osteomyelitis.  EXAM: LEFT TIBIA AND FIBULA - 2 VIEW  COMPARISON:  05/06/2013  FINDINGS: Since the previous study, there has been interval placement of intra medullary rod and screw fixation of the distal tibia through to the calcaneus. There is prominent productive bone around the tibia and tibia fibular joint with bone nonunion mildly displaced distal fibular fracture. There has been significant bone bruise or shown in the talus and upper calcaneus. Loss of distinction of cortical surfaces at the margin of the eroded bone. Diffuse soft tissue swelling. Findings likely to represent osteomyelitis.  IMPRESSION: Postoperative changes with intra medullary rod and screw fixation from the lower tibia through to the calcaneus. Prominent bone erosion of the distal tibia, talus, and upper calcaneus with productive bone in the lower  tibia fibular region. Changes likely represent osteomyelitis.   Electronically Signed   By: Lucienne Capers M.D.   On: 08/21/2013 22:12   Ct Head Wo Contrast  08/29/2013   CLINICAL DATA:  Loss of consciousness.  Seizure like activity.  EXAM: CT HEAD WITHOUT CONTRAST  TECHNIQUE: Contiguous axial images were obtained from the base of the skull through the vertex without intravenous contrast.  COMPARISON:  10/05/2007.  FINDINGS: No midline shift, hydrocephalus, hemorrhage. No acute territorial cortical ischemia/infarct. Atrophy and chronic ischemic white matter disease is present. Motion artifact is present on the examination. The patient was moving. The slices were repeated. Intracranial atherosclerosis is present. Mild ethmoid air cell opacification on the right. The mastoid air cells appear normal. Sphenoid sinuses clear.  IMPRESSION: 1. Low attenuation in the left corona radiata and centrum semiovale is suspicious for vasogenic edema. This could represent asymmetric chronic ischemic  white matter disease however the amount of progression since 2009 would be unusual. Followup MRI recommended for further assessment, preferably with and without contrast. 2. Study mildly degraded by motion artifact. 3. No definite acute intracranial abnormality.   Electronically Signed   By: Dereck Ligas M.D.   On: 08/29/2013 01:42   Ct Angio Chest Pe W/cm &/or Wo Cm  08/29/2013   CLINICAL DATA:  Syncope.  Assess for pulmonary embolus.  EXAM: CT ANGIOGRAPHY CHEST WITH CONTRAST  TECHNIQUE: Multidetector CT imaging of the chest was performed using the standard protocol during bolus administration of intravenous contrast. Multiplanar CT image reconstructions and MIPs were obtained to evaluate the vascular anatomy.  CONTRAST:  147mL OMNIPAQUE IOHEXOL 350 MG/ML SOLN  COMPARISON:  Chest radiograph performed earlier today at 12:58 a.m.  FINDINGS: There is no evidence of pulmonary embolus.  Minimal bilateral dependent subsegmental atelectasis is noted. The lungs are otherwise clear. There is no evidence of significant focal consolidation, pleural effusion or pneumothorax. No masses are identified; no abnormal focal contrast enhancement is seen.  Diffuse coronary artery calcifications are seen. [name] status post median sternotomy. No pericardial effusion is identified. Visualized mediastinal nodes remain normal in size. The great vessels are grossly unremarkable in appearance. No axillary lymphadenopathy is seen. The visualized portions of the thyroid gland are unremarkable in appearance.  The visualized portions of the liver are unremarkable. The spleen is enlarged, measuring 16.6 cm in length. [name] status post cholecystectomy, with clips noted along the gallbladder fossa.  No acute osseous abnormalities are seen.  Review of the MIP images confirms the above findings.  IMPRESSION: 1. No evidence of pulmonary embolus. 2. Minimal bilateral dependent atelectasis noted; lungs otherwise clear. 3. Diffuse  coronary artery calcifications seen. 4. Splenomegaly noted.   Electronically Signed   By: Garald Balding M.D.   On: 08/29/2013 04:42   Mr Brain Wo Contrast  08/29/2013   CLINICAL DATA:  Altered mental status. Diabetes. Generalized seizure, elevated blood pressure.  EXAM: MRI HEAD WITHOUT CONTRAST  TECHNIQUE: Multiplanar, multiecho pulse sequences of the brain and surrounding structures were obtained without intravenous contrast.  COMPARISON:  CT head 08/29/2013.  MR head 10/05/2007.  FINDINGS: The study is markedly degraded by motion. In addition, post infusion imaging could not be obtained despite IV administration of contrast. Therefore the study is of only limited usefulness.  Re-demonstrated from today's CT is an asymmetric area of edema in the left hemisphere primarily affecting the anterior frontal cortex within and just anterior to the supplemental motor area. The cortex displays T2 and FLAIR hyperintensity, and  there is abnormal signal extending caudally as far as the posterior limb internal capsule on the left.  There is no midline shift. No other similar areas are seen. No restricted diffusion to suggest acute stroke. No acute or chronic hemorrhage.  Premature for age cerebral and cerebellar atrophy. Premature for age chronic microvascular ischemic change. Flow voids are maintained. No acute sinus or mastoid disease. Negative orbits.  Compared with prior MR, the atrophy and small vessel disease has progressed. There was no mass lesion in the left hemisphere in 2009.  IMPRESSION: Infiltrative mass in the left frontal cortex with probable associated vasogenic edema extending caudally. Considerations include primary brain tumor, metastatic disease, or intracranial infection, either bacterial or viral (PML). If the patient is more cooperative, post infusion imaging could be re-attempted, possibly providing more specificity. Alternately, tissue sampling could be performed.   Electronically Signed   By: Rolla Flatten M.D.   On: 08/29/2013 13:40   Mr Jeri Cos Contrast  08/30/2013   ADDENDUM REPORT: 08/30/2013 14:26  ADDENDUM: Study reviewed in person with Dr. Roland Rack on 08/30/2013 at 1355 hrs.  We discussed that the left ICA flow void has been absent since 2009. Also, the patient has not had convincing symptoms of left MCA territory ischemia.  Leading differential consideration based on clinical and imaging constellation at this point is viral CNS infection.  Lumbar puncture for CSF analysis will be pursued, probably following platelet transfusion since the patient is currently on aspirin and Plavix.   Electronically Signed   By: Lars Pinks M.D.   On: 08/30/2013 14:26   08/30/2013   CLINICAL DATA:  58 year old female presents for post-contrast imaging to evaluate infiltrative left frontal lobe mass. Loss of consciousness with seizure-like activity.  EXAM: MRI HEAD WITH CONTRAST  TECHNIQUE: Multiplanar, multiecho pulse sequences of the brain and surrounding structures were obtained with intravenous contrast.  COMPARISON:  Brain MRI without contrast 08/29/2013. Head CT 08/29/2013. Brain MRI 10/05/2007.  CONTRAST:  65mL MULTIHANCE GADOBENATE DIMEGLUMINE 529 MG/ML IV SOLN  FINDINGS: Study is intermittently degraded by motion artifact despite repeated imaging attempts.  Diffusion imaging was repeated, but is of lesser quality than that done yesterday which demonstrated facilitated diffusion in the affected left frontal lobe and corona radiata.  Confluent white matter T2 and FLAIR hyperintensity persists in the same pattern as demonstrated yesterday.  Also there is asymmetric visualization on FLAIR of some left MCA branches about the operculum.  The area of involvement was hypo intense on noncontrast T1 weighted imaging. On today's post-contrast T1 weighted imaging no enhancement is evident.  No abnormal enhancement elsewhere in the brain.  IMPRESSION: 1. Motion degraded exam despite repeated imaging attempts, image  quality less than that obtained on yesterday's study. 2. No convincing enhancement to correspond to the left frontal lobe lesion. 3. Suggestion of abnormal flow in the left MCA branches. Recommend followup head and neck CTA (considering that MRA is unlikely to be successful with the patient's degree of motion).  Electronically Signed: By: Lars Pinks M.D. On: 08/30/2013 13:48   Dg Chest Port 1 View  08/29/2013   CLINICAL DATA:  Loss of consciousness.  EXAM: PORTABLE CHEST - 1 VIEW  COMPARISON:  05/11/2013.  FINDINGS: Cardiopericardial silhouette within normal limits for projection. Chronic blurring of the left heart border, likely associated with a large pericardial fat pad. Lung volumes are lower than on prior examinations. Basilar atelectasis. Median sternotomy/ CABG. Monitoring leads project over the chest. No airspace disease. No effusion.  IMPRESSION: Low volume chest.   Electronically Signed   By: Dereck Ligas M.D.   On: 08/29/2013 01:13   Dg Foot 2 Views Left  08/21/2013   CLINICAL DATA:  Swelling and open sores in the distal lower leg to rule out osteomyelitis.  EXAM: LEFT FOOT - 2 VIEW  COMPARISON:  Left foot 05/13/2009  FINDINGS: Since the previous study, there has been resection or resorption of the left second, third, and fourth toes at the metatarsal phalangeal region. There is bone resection or resorption involving the distal aspect of the remaining second and fourth metatarsal bones. Diffuse soft tissue swelling. Changes are nonspecific without interval comparison studies and could represent postoperative change or changes due to osteomyelitis. Diffuse bone demineralization. Cortical sclerosis along the mid shafts of the metatarsal bones. Prominent hallux valgus deformity with prominent degenerative changes in the first metatarsal phalangeal joint.  IMPRESSION: Resection or resorption of the left second, third, and fourth toes with postoperative or erosive changes involving the metatarsal heads  of second and fourth rays. Changes could represent postoperative change or osteomyelitis.   Electronically Signed   By: Lucienne Capers M.D.   On: 08/21/2013 22:14   Dg Fluoro Guide Lumbar Puncture  08/31/2013   CLINICAL DATA:  58 year old female with altered mental status, seizure, abnormal brain MRI. Diagnostic lumbar puncture for CSF analysis requested under fluoroscopic guidance due to the patient being on Plavix. Initial encounter.  EXAM: DIAGNOSTIC LUMBAR PUNCTURE UNDER FLUOROSCOPIC GUIDANCE  FLUOROSCOPY TIME:  0 min and 21 seconds.  PROCEDURE: The patient underwent platelet transfusion prior to the procedure.  Informed consent was obtained from the patient and her son prior to the procedure, including potential complications of headache, allergy, and pain. A "time-out" was performed.  With the patient prone, the lower back was prepped with Betadine. 1% Lidocaine was used for local anesthesia. Lumbar puncture was performed at the L4-L5 level using right sub laminar technique, a 3.5 inch x 20 gauge needle with return of clear, colorless CSF with an opening pressure of 17 cm water. 22ml of CSF were obtained for laboratory studies.  The needle was withdrawn, direct pressure was held, and hemostasis was noted. Appropriate postprocedural orders were placed. The patient tolerated the procedure well and there were no apparent complications.  IMPRESSION: Diagnostic lumbar puncture at L4-L5. Opening pressure 17 cm of water. Clear, colorless CSF sent to the lab for analysis.   Electronically Signed   By: Lars Pinks M.D.   On: 08/31/2013 16:30    Micro Results      Recent Results (from the past 240 hour(s))  CULTURE, BLOOD (ROUTINE X 2)     Status: None   Collection Time    08/29/13  1:06 AM      Result Value Ref Range Status   Specimen Description BLOOD RIGHT ARM   Final   Special Requests BOTTLES DRAWN AEROBIC AND ANAEROBIC 10CC EACH   Final   Culture  Setup Time     Final   Value: 08/29/2013 04:13      Performed at [facility]   Culture     Final   Value:        BLOOD CULTURE RECEIVED NO GROWTH TO DATE CULTURE WILL BE HELD FOR 5 DAYS BEFORE ISSUING A FINAL NEGATIVE REPORT     Performed at [facility]   Report Status PENDING   Incomplete  CULTURE, BLOOD (ROUTINE X 2)     Status: None   Collection Time  08/29/13  1:11 AM      Result Value Ref Range Status   Specimen Description BLOOD RIGHT WRIST   Final   Special Requests BOTTLES DRAWN AEROBIC ONLY 5CC   Final   Culture  Setup Time     Final   Value: 08/29/2013 04:13     Performed at [facility]   Culture     Final   Value:        BLOOD CULTURE RECEIVED NO GROWTH TO DATE CULTURE WILL BE HELD FOR 5 DAYS BEFORE ISSUING A FINAL NEGATIVE REPORT     Performed at [facility]   Report Status PENDING   Incomplete  MRSA PCR SCREENING     Status: None   Collection Time    08/29/13  3:42 AM      Result Value Ref Range Status   MRSA by PCR NEGATIVE  NEGATIVE Final   Comment:            The GeneXpert MRSA Assay (FDA     approved for NASAL specimens     only), is one component of a     comprehensive MRSA colonization     surveillance program. It is not     intended to diagnose MRSA     infection nor to guide or     monitor treatment for     MRSA infections.  CSF CULTURE     Status: None   Collection Time    08/31/13  4:15 PM      Result Value Ref Range Status   Specimen Description CSF   Final   Special Requests 7.0ML   Final   Gram Stain     Final   Value: WBC PRESENT, PREDOMINANTLY MONONUCLEAR     NO ORGANISMS SEEN     CYTOSPSIN Performed at [facility]     Performed at [facility]   Culture PENDING   Incomplete   Report Status PENDING   Incomplete  GRAM STAIN     Status: None   Collection Time    08/31/13  4:15 PM      Result Value Ref Range Status   Specimen Description CSF   Final   Special Requests 7.0ML FLUID   Final   Gram Stain     Final   Value: CYTOSPIN PREP      SCANT MONONUCLEAR  WBC PRESENT     NO ORGANISMS SEEN   Report Status 08/31/2013 FINAL   Final     History of present illness and  Hospital Course:     Kindly see H&P for history of present illness and admission details, please review complete Labs, Consult reports and Test reports for all details in brief ELENER MUNUZ, is a 58 y.o. female, patient with history of  diabetes mellitus, peripheral vascular disease, hypertension, and CAD status post CABG who was recently admitted for left lower extremity osteomyelitis undergoing trans-tibial amputation was brought to the ER after patient's family noted her to be having tonic-clonic seizures and tongue biting. Patient was brought to the ER and CT head showed left-sided edema and Neurology on call was consulted. Patient was started on Keppra loading dose for possible seizures and admitted for further management. Patient has a history of seizures 8 years prior. Patient was found to be tachycardic with a rate around 140 beats per minute and blood pressure was found to be elevated in the [id number] systolic.    L brain hypodensity with seizure  Initially felt to be suspicious for infiltrating mass on CT scan. MRI now limited due to motion this has been an issue in the past as well, patient has history of involuntary movement, LP done was not consistent with infection, discussed with neurology will be discharged on Keppra with outpatient neurology followup, cytology from CSF is pending. He is now back to baseline with no further seizure activity.     Sinus tachycardia  No PE on CTA of chest - she is clinically much improved, some anxiety playing a role in mild tachycardia, have increased beta blocker dose continue to monitor outpatient. TSH stable.     Accelerated HTN  BLOOD PRESSURE MEDICATIONS ADJUSTED on HYDRALAZINE AND IMDUR AND INCREASED BETA BLOCKER DOSE. WE'LL REQUEST PCP TO CONTINUE MONITORING BLOOD PRESSURE AND ADJUST MEDICATIONS AS NEEDED,  HOME DOSE ACE INHIBITOR COMMENCED.     PVD with osteomyelitis status post left BKA 08/22/2013 - no acute issue      CAD s/p CABG 2009  Symptom-free on Imdur, hydralazine, lopressor and statin for secondary prevention. Antiplatelets resumed     DM2 - uncontrolled - with vascular complications   CBG poorly controlled - assume home insulin regimen we'll request PCP to monitor A1c and glycemic control closely. Patient requested to do q. a.c. at bedtime Accu-Cheks and document results in a logbook, present to PCP next visit  Lab Results   Component  Value  Date    HGBA1C  7.3*  08/29/2013    CBG (last 3)   Recent Labs   08/30/13 1632  08/30/13 2111  08/31/13 0659   GLUCAP  201*  119*  146*        Anemia of chronic disease  No acute issues monitor.         Today   Subjective:   Jilliana Shacklett today has no headache,no chest abdominal pain,no new weakness tingling or numbness, feels much better wants to go home today.    Objective:   Blood pressure 170/85, pulse 95, temperature 98.3 F (36.8 C), temperature source Oral, resp. rate 18, height 5\' 5"  (1.651 m), weight 67 kg (147 lb 11.3 oz), SpO2 99.00%.   Intake/Output Summary (Last 24 hours) at 09/01/13 1122 Last data filed at 09/01/13 0900  Gross per 24 hour  Intake    878 ml  Output      0 ml  Net    878 ml    Exam Awake Alert, Oriented x 3, No new F.N deficits, Normal affect [place].AT,PERRAL Supple Neck,No JVD, No cervical lymphadenopathy appriciated.  Symmetrical Chest wall movement, Good air movement bilaterally, CTAB RRR,No Gallops,Rubs or new Murmurs, No Parasternal Heave +ve B.Sounds, Abd Soft, Non tender, No organomegaly appriciated, No rebound -guarding or rigidity. No Cyanosis, Clubbing or edema, No new Rash or bruise, bilateral BKA  Data Review   Results for JEMIYAH, RADUENZ (MRN [id number]) as of 09/01/2013 10:21  Ref. Range 08/31/2013 16:15  Glucose, CSF Latest Range: 43-76 mg/dL 82 (H)    Total  Protein, CSF Latest Range: 15-45 mg/dL 29  RBC Count, CSF Latest Range: 0 /cu mm 17 (H)  WBC, CSF Latest Range: 0-5 /cu mm 1  Segmented Neutrophils-CSF Latest Range: 0-6 % RARE  Lymphs, CSF Latest Range: 40-80 % FEW  Monocyte-Macrophage-Spinal Fluid Latest Range: 15-45 % RARE  Other Cells, CSF No range found TOO FEW TO COUNT, SMEAR AVAILABLE FOR REVIEW  Appearance, CSF Latest Range: CLEAR  CLEAR (A)  Color, CSF Latest Range: COLORLESS  COLORLESS  Supernatant  No range found NOT INDICATED  Tube # No range found 1     CBC w Diff: Lab Results  Component Value Date   WBC 6.6 08/31/2013   HGB 8.8* 08/31/2013   HCT 29.5* 08/31/2013   PLT 432* 08/31/2013   LYMPHOPCT 17 08/29/2013   MONOPCT 9 08/29/2013   EOSPCT 2 08/29/2013   BASOPCT 0 08/29/2013    CMP: Lab Results  Component Value Date   NA 141 08/31/2013   K 3.6* 08/31/2013   CL 101 08/31/2013   CO2 27 08/31/2013   BUN 10 08/31/2013   CREATININE 1.18* 08/31/2013   PROT 6.6 08/29/2013   ALBUMIN 2.0* 08/29/2013   BILITOT 0.4 08/29/2013   ALKPHOS 125* 08/29/2013   AST 18 08/29/2013   ALT 25 08/29/2013  .  Lab Results  Component Value Date   HGBA1C 7.3* 08/29/2013    Lab Results  Component Value Date   CHOL 113 08/30/2013   HDL 36* 08/30/2013   LDLCALC 47 08/30/2013   TRIG 149 08/30/2013   CHOLHDL 3.1 08/30/2013     Total Time in preparing paper work, data evaluation and todays exam - 35 minutes  Thurnell Lose M.D on 09/01/2013 at [facility]  [phone]   **Disclaimer: This note may have been dictated with voice recognition software. Similar sounding words can inadvertently be transcribed and this note may contain transcription errors which may not have been corrected upon publication of note.**

## 2013-09-01 NOTE — Progress Notes (Signed)
PHARMACY CONSULT NOTE - INITIAL  Pharmacy Consult for :   Acyclovir Indication:  Empiric Acyclovir for suspected Viral Encephalitis  Allergies: Allergies  Allergen Reactions  . Tape Rash    Redness, rash, itchiness    Patient Measurements: Height: 5\' 5"  (165.1 cm) Weight: 147 lb 11.3 oz (67 kg) IBW/kg (Calculated) : 57  Vital Signs: BP 182/91  Pulse 105  Temp(Src) 98.3 F (36.8 C) (Oral)  Resp 18  Ht 5\' 5"  (1.651 m)  Wt 147 lb 11.3 oz (67 kg)  BMI 24.58 kg/m2  SpO2 99%  Labs:  Recent Labs  08/30/13 0550 08/31/13 0440  WBC 5.7 6.6  HGB 7.9* 8.8*  PLT 369 432*  CREATININE 1.32* 1.18*   Estimated Creatinine Clearance: 47.9 ml/min (by C-G formula based on Cr of 1.18).   Microbiology: Recent Results (from the past 720 hour(s))  CULTURE, BLOOD (ROUTINE X 2)     Status: None   Collection Time    08/21/13  8:10 PM      Result Value Ref Range Status   Specimen Description BLOOD RIGHT ARM   Final   Special Requests BOTTLES DRAWN AEROBIC AND ANAEROBIC 10CC EA   Final   Culture  Setup Time     Final   Value: 08/22/2013 01:52     Performed at [facility]   Culture     Final   Value: NO GROWTH 5 DAYS     Performed at [facility]   Report Status 08/28/2013 FINAL   Final  CULTURE, BLOOD (ROUTINE X 2)     Status: None   Collection Time    08/21/13  8:20 PM      Result Value Ref Range Status   Specimen Description BLOOD RIGHT HAND   Final   Special Requests BOTTLES DRAWN AEROBIC ONLY 10CC   Final   Culture  Setup Time     Final   Value: 08/22/2013 01:52     Performed at [facility]   Culture     Final   Value: NO GROWTH 5 DAYS     Performed at [facility]   Report Status 08/28/2013 FINAL   Final  MRSA PCR SCREENING     Status: Abnormal   Collection Time    08/22/13  4:49 AM      Result Value Ref Range Status   MRSA by PCR POSITIVE (*) NEGATIVE Final   Comment:            The GeneXpert MRSA Assay (FDA     approved for NASAL  specimens     only), is one component of a     comprehensive MRSA colonization     surveillance program. It is not     intended to diagnose MRSA     infection nor to guide or     monitor treatment for     MRSA infections.     RESULT CALLED TO, READ BACK BY AND VERIFIED WITH:     [facility] RN [id number] AT [phone] SKEEN,P  CULTURE, BLOOD (ROUTINE X 2)     Status: None   Collection Time    08/29/13  1:06 AM      Result Value Ref Range Status   Specimen Description BLOOD RIGHT ARM   Final   Special Requests BOTTLES DRAWN AEROBIC AND ANAEROBIC 10CC EACH   Final   Culture  Setup Time     Final   Value: 08/29/2013 04:13  Performed at [facility]     Final   Value:        BLOOD CULTURE RECEIVED NO GROWTH TO DATE CULTURE WILL BE HELD FOR 5 DAYS BEFORE ISSUING A FINAL NEGATIVE REPORT     Performed at [facility]   Report Status PENDING   Incomplete  CULTURE, BLOOD (ROUTINE X 2)     Status: None   Collection Time    08/29/13  1:11 AM      Result Value Ref Range Status   Specimen Description BLOOD RIGHT WRIST   Final   Special Requests BOTTLES DRAWN AEROBIC ONLY 5CC   Final   Culture  Setup Time     Final   Value: 08/29/2013 04:13     Performed at [facility]   Culture     Final   Value:        BLOOD CULTURE RECEIVED NO GROWTH TO DATE CULTURE WILL BE HELD FOR 5 DAYS BEFORE ISSUING A FINAL NEGATIVE REPORT     Performed at [facility]   Report Status PENDING   Incomplete  MRSA PCR SCREENING     Status: None   Collection Time    08/29/13  3:42 AM      Result Value Ref Range Status   MRSA by PCR NEGATIVE  NEGATIVE Final   Comment:            The GeneXpert MRSA Assay (FDA     approved for NASAL specimens     only), is one component of a     comprehensive MRSA colonization     surveillance program. It is not     intended to diagnose MRSA     infection nor to guide or     monitor treatment for     MRSA infections.  CSF CULTURE     Status: None    Collection Time    08/31/13  4:15 PM      Result Value Ref Range Status   Specimen Description CSF   Final   Special Requests 7.0ML   Final   Gram Stain     Final   Value: WBC PRESENT, PREDOMINANTLY MONONUCLEAR     NO ORGANISMS SEEN     CYTOSPSIN Performed at [facility]     Performed at [facility]   Culture PENDING   Incomplete   Report Status PENDING   Incomplete  GRAM STAIN     Status: None   Collection Time    08/31/13  4:15 PM      Result Value Ref Range Status   Specimen Description CSF   Final   Special Requests 7.0ML FLUID   Final   Gram Stain     Final   Value: CYTOSPIN PREP     SCANT MONONUCLEAR  WBC PRESENT     NO ORGANISMS SEEN   Report Status 08/31/2013 FINAL   Final   Antibiotic[s]: Anti-infectives   Start     Dose/Rate Route Frequency Ordered Stop   09/01/13 1400  acyclovir (ZOVIRAX) 570 mg in dextrose 5 % 100 mL IVPB     570 mg 111.4 mL/hr over 60 Minutes Intravenous Every 12 hours 09/01/13 0951     08/31/13 0200  acyclovir (ZOVIRAX) 670 mg in dextrose 5 % 100 mL IVPB  Status:  Discontinued     10 mg/kg  67 kg 113.4 mL/hr over 60 Minutes Intravenous Every 12 hours 08/30/13 1619 09/01/13  [id number]   08/29/13 2200  acyclovir (ZOVIRAX) 670 mg in dextrose 5 % 100 mL IVPB  Status:  Discontinued     10 mg/kg  67 kg 113.4 mL/hr over 60 Minutes Intravenous 3 times per day 08/29/13 1807 08/30/13 1619      Assessment: 31 yof initially admitted with new seizures continues on D#4 of acyclovir for r/o encephalitis. Pt is afebrile and WBC WNL. Currently dosed at 10mg /kg of total body weight. LP performed and showed elevated glucose level and RBC but WBC and protein were WNL. Scr has improved to 1.18 as of yesterday.   Acyclovir 6/16>>  6/17 CSF - pending 6/15 BCx2 - ngtd  Goal of Therapy:  Eradication of infection  Plan:  1. Change acyclovir to 570mg  IV Q12H (10mg /kg ideal body weight) 2. F/u renal fxn, C&S, clinical status  3. F/u neuro  recommendations 4. BMET in AM  Salome Arnt, PharmD, BCPS Pager # [phone] 09/01/2013 9:55 AM

## 2013-09-01 NOTE — Discharge Instructions (Signed)
Follow with Primary MD [name], MD in 7 days   Get CBC, CMP  checked  by Primary MD next visit.    Activity: As tolerated with Full fall precautions use walker/cane & assistance as needed   Disposition Home     Diet: Heart Healthy Low carb  Accuchecks 4 times/day, Once in AM empty stomach and then before each meal. Log in all results and show them to your Prim.MD in 3 days. If any glucose reading is under 80 or above 300 call your Prim MD immidiately. Follow Low glucose instructions for glucose under 80 as instructed.  .  For Heart failure patients - Check your Weight same time everyday, if you gain over 2 pounds, or you develop in leg swelling, experience more shortness of breath or chest pain, call your Primary MD immediately. Follow Cardiac Low Salt Diet and 1.8 lit/day fluid restriction.   On your next visit with her primary care physician please Get Medicines reviewed and adjusted.  Please request your Prim.MD to go over all Hospital Tests and Procedure/Radiological results at the follow up, please get all Hospital records sent to your Prim MD by signing hospital release before you go home.   If you experience worsening of your admission symptoms, develop shortness of breath, life threatening emergency, suicidal or homicidal thoughts you must seek medical attention immediately by calling 911 or calling your MD immediately  if symptoms less severe.  You Must read complete instructions/literature along with all the possible adverse reactions/side effects for all the Medicines you take and that have been prescribed to you. Take any new Medicines after you have completely understood and accpet all the possible adverse reactions/side effects.   Do not drive and provide baby sitting services if your were admitted for syncope or siezures until you have seen by Primary MD or a Neurologist and advised to do so again.  Do not drive when taking Pain medications.    Do not take  more than prescribed Pain, Sleep and Anxiety Medications  Special Instructions: If you have smoked or chewed Tobacco  in the last 2 yrs please stop smoking, stop any regular Alcohol  and or any Recreational drug use.  Wear Seat belts while driving.   Please note  You were cared for by a hospitalist during your hospital stay. If you have any questions about your discharge medications or the care you received while you were in the hospital after you are discharged, you can call the unit and asked to speak with the hospitalist on call if the hospitalist that took care of you is not available. Once you are discharged, your primary care physician will handle any further medical issues. Please note that NO REFILLS for any discharge medications will be authorized once you are discharged, as it is imperative that you return to your primary care physician (or establish a relationship with a primary care physician if you do not have one) for your aftercare needs so that they can reassess your need for medications and monitor your lab values.

## 2013-09-02 ENCOUNTER — Encounter (HOSPITAL_COMMUNITY): Payer: Self-pay | Admitting: Emergency Medicine

## 2013-09-02 ENCOUNTER — Emergency Department (HOSPITAL_COMMUNITY): Payer: Medicare Other

## 2013-09-02 ENCOUNTER — Inpatient Hospital Stay (HOSPITAL_COMMUNITY)
Admission: EM | Admit: 2013-09-02 | Discharge: 2013-09-05 | DRG: 100 | Disposition: A | Discharge status: Home Health | Payer: Medicare Other | Attending: Internal Medicine | Admitting: Internal Medicine

## 2013-09-02 DIAGNOSIS — I25810 Atherosclerosis of coronary artery bypass graft(s) without angina pectoris: Secondary | ICD-10-CM

## 2013-09-02 DIAGNOSIS — Z9889 Other specified postprocedural states: Secondary | ICD-10-CM

## 2013-09-02 DIAGNOSIS — R0989 Other specified symptoms and signs involving the circulatory and respiratory systems: Secondary | ICD-10-CM

## 2013-09-02 DIAGNOSIS — S88119A Complete traumatic amputation at level between knee and ankle, unspecified lower leg, initial encounter: Secondary | ICD-10-CM

## 2013-09-02 DIAGNOSIS — H5509 Other forms of nystagmus: Secondary | ICD-10-CM | POA: Diagnosis present

## 2013-09-02 DIAGNOSIS — G40901 Epilepsy, unspecified, not intractable, with status epilepticus: Secondary | ICD-10-CM

## 2013-09-02 DIAGNOSIS — I2510 Atherosclerotic heart disease of native coronary artery without angina pectoris: Secondary | ICD-10-CM | POA: Diagnosis present

## 2013-09-02 DIAGNOSIS — N189 Chronic kidney disease, unspecified: Secondary | ICD-10-CM | POA: Diagnosis present

## 2013-09-02 DIAGNOSIS — E1165 Type 2 diabetes mellitus with hyperglycemia: Secondary | ICD-10-CM | POA: Diagnosis present

## 2013-09-02 DIAGNOSIS — E1142 Type 2 diabetes mellitus with diabetic polyneuropathy: Secondary | ICD-10-CM | POA: Diagnosis present

## 2013-09-02 DIAGNOSIS — Z794 Long term (current) use of insulin: Secondary | ICD-10-CM | POA: Diagnosis present

## 2013-09-02 DIAGNOSIS — G40909 Epilepsy, unspecified, not intractable, without status epilepticus: Secondary | ICD-10-CM

## 2013-09-02 DIAGNOSIS — M869 Osteomyelitis, unspecified: Secondary | ICD-10-CM

## 2013-09-02 DIAGNOSIS — E8881 Metabolic syndrome: Secondary | ICD-10-CM | POA: Diagnosis present

## 2013-09-02 DIAGNOSIS — I70219 Atherosclerosis of native arteries of extremities with intermittent claudication, unspecified extremity: Secondary | ICD-10-CM

## 2013-09-02 DIAGNOSIS — I739 Peripheral vascular disease, unspecified: Secondary | ICD-10-CM

## 2013-09-02 DIAGNOSIS — E876 Hypokalemia: Secondary | ICD-10-CM | POA: Diagnosis present

## 2013-09-02 DIAGNOSIS — I509 Heart failure, unspecified: Secondary | ICD-10-CM | POA: Diagnosis present

## 2013-09-02 DIAGNOSIS — G936 Cerebral edema: Secondary | ICD-10-CM | POA: Diagnosis present

## 2013-09-02 DIAGNOSIS — Z8679 Personal history of other diseases of the circulatory system: Secondary | ICD-10-CM

## 2013-09-02 DIAGNOSIS — E118 Type 2 diabetes mellitus with unspecified complications: Secondary | ICD-10-CM

## 2013-09-02 DIAGNOSIS — E43 Unspecified severe protein-calorie malnutrition: Secondary | ICD-10-CM

## 2013-09-02 DIAGNOSIS — Z7982 Long term (current) use of aspirin: Secondary | ICD-10-CM

## 2013-09-02 DIAGNOSIS — E785 Hyperlipidemia, unspecified: Secondary | ICD-10-CM

## 2013-09-02 DIAGNOSIS — Z951 Presence of aortocoronary bypass graft: Secondary | ICD-10-CM

## 2013-09-02 DIAGNOSIS — R569 Unspecified convulsions: Secondary | ICD-10-CM

## 2013-09-02 DIAGNOSIS — I6529 Occlusion and stenosis of unspecified carotid artery: Secondary | ICD-10-CM

## 2013-09-02 DIAGNOSIS — D638 Anemia in other chronic diseases classified elsewhere: Secondary | ICD-10-CM

## 2013-09-02 DIAGNOSIS — I10 Essential (primary) hypertension: Secondary | ICD-10-CM

## 2013-09-02 DIAGNOSIS — F411 Generalized anxiety disorder: Secondary | ICD-10-CM | POA: Diagnosis present

## 2013-09-02 DIAGNOSIS — G9340 Encephalopathy, unspecified: Secondary | ICD-10-CM

## 2013-09-02 DIAGNOSIS — G40401 Other generalized epilepsy and epileptic syndromes, not intractable, with status epilepticus: Principal | ICD-10-CM

## 2013-09-02 DIAGNOSIS — E119 Type 2 diabetes mellitus without complications: Secondary | ICD-10-CM

## 2013-09-02 DIAGNOSIS — F341 Dysthymic disorder: Secondary | ICD-10-CM

## 2013-09-02 DIAGNOSIS — L98499 Non-pressure chronic ulcer of skin of other sites with unspecified severity: Secondary | ICD-10-CM

## 2013-09-02 DIAGNOSIS — I129 Hypertensive chronic kidney disease with stage 1 through stage 4 chronic kidney disease, or unspecified chronic kidney disease: Secondary | ICD-10-CM | POA: Diagnosis present

## 2013-09-02 DIAGNOSIS — Z79899 Other long term (current) drug therapy: Secondary | ICD-10-CM

## 2013-09-02 DIAGNOSIS — M86172 Other acute osteomyelitis, left ankle and foot: Secondary | ICD-10-CM

## 2013-09-02 DIAGNOSIS — Z8669 Personal history of other diseases of the nervous system and sense organs: Secondary | ICD-10-CM

## 2013-09-02 DIAGNOSIS — Z9089 Acquired absence of other organs: Secondary | ICD-10-CM

## 2013-09-02 DIAGNOSIS — E1141 Type 2 diabetes mellitus with diabetic mononeuropathy: Secondary | ICD-10-CM | POA: Diagnosis present

## 2013-09-02 DIAGNOSIS — K219 Gastro-esophageal reflux disease without esophagitis: Secondary | ICD-10-CM | POA: Diagnosis present

## 2013-09-02 DIAGNOSIS — F329 Major depressive disorder, single episode, unspecified: Secondary | ICD-10-CM | POA: Diagnosis present

## 2013-09-02 DIAGNOSIS — E1149 Type 2 diabetes mellitus with other diabetic neurological complication: Secondary | ICD-10-CM | POA: Diagnosis present

## 2013-09-02 DIAGNOSIS — F3289 Other specified depressive episodes: Secondary | ICD-10-CM | POA: Diagnosis present

## 2013-09-02 DIAGNOSIS — D649 Anemia, unspecified: Secondary | ICD-10-CM | POA: Diagnosis present

## 2013-09-02 DIAGNOSIS — E11610 Type 2 diabetes mellitus with diabetic neuropathic arthropathy: Secondary | ICD-10-CM

## 2013-09-02 HISTORY — DX: Encephalopathy, unspecified: G93.40

## 2013-09-02 HISTORY — DX: Unspecified convulsions: R56.9

## 2013-09-02 LAB — URINALYSIS, ROUTINE W REFLEX MICROSCOPIC
BILIRUBIN URINE: NEGATIVE
Glucose, UA: 100 mg/dL — AB
KETONES UR: 15 mg/dL — AB
LEUKOCYTES UA: NEGATIVE
Nitrite: NEGATIVE
Protein, ur: >300 mg/dL — AB
SPECIFIC GRAVITY, URINE: 1.016 (ref 1.005–1.030)
UROBILINOGEN UA: 0.2 mg/dL (ref 0.0–1.0)
pH: 6.5 (ref 5.0–8.0)

## 2013-09-02 LAB — VDRL, CSF: SYPHILIS VDRL QUANT CSF: NONREACTIVE

## 2013-09-02 LAB — CBC WITH DIFFERENTIAL/PLATELET
BASOS PCT: 0 % (ref 0–1)
Basophils Absolute: 0 10*3/uL (ref 0.0–0.1)
EOS ABS: 0 10*3/uL (ref 0.0–0.7)
Eosinophils Relative: 1 % (ref 0–5)
HCT: 31.8 % — ABNORMAL LOW (ref 36.0–46.0)
HEMOGLOBIN: 9.7 g/dL — AB (ref 12.0–15.0)
Lymphocytes Relative: 5 % — ABNORMAL LOW (ref 12–46)
Lymphs Abs: 0.5 10*3/uL — ABNORMAL LOW (ref 0.7–4.0)
MCH: 24.6 pg — AB (ref 26.0–34.0)
MCHC: 30.5 g/dL (ref 30.0–36.0)
MCV: 80.7 fL (ref 78.0–100.0)
MONO ABS: 0.3 10*3/uL (ref 0.1–1.0)
MONOS PCT: 3 % (ref 3–12)
Neutro Abs: 8 10*3/uL — ABNORMAL HIGH (ref 1.7–7.7)
Neutrophils Relative %: 91 % — ABNORMAL HIGH (ref 43–77)
Platelets: 429 10*3/uL — ABNORMAL HIGH (ref 150–400)
RBC: 3.94 MIL/uL (ref 3.87–5.11)
RDW: 18 % — ABNORMAL HIGH (ref 11.5–15.5)
WBC: 8.8 10*3/uL (ref 4.0–10.5)

## 2013-09-02 LAB — COMPREHENSIVE METABOLIC PANEL
ALBUMIN: 2.7 g/dL — AB (ref 3.5–5.2)
ALK PHOS: 123 U/L — AB (ref 39–117)
ALT: 17 U/L (ref 0–35)
AST: 25 U/L (ref 0–37)
BILIRUBIN TOTAL: 0.5 mg/dL (ref 0.3–1.2)
BUN: 17 mg/dL (ref 6–23)
CO2: 27 mEq/L (ref 19–32)
Calcium: 8.8 mg/dL (ref 8.4–10.5)
Chloride: 100 mEq/L (ref 96–112)
Creatinine, Ser: 1.25 mg/dL — ABNORMAL HIGH (ref 0.50–1.10)
GFR calc non Af Amer: 47 mL/min — ABNORMAL LOW (ref >90)
GFR, EST AFRICAN AMERICAN: 55 mL/min — AB (ref >90)
Glucose, Bld: 267 mg/dL — ABNORMAL HIGH (ref 70–99)
POTASSIUM: 3.8 meq/L (ref 3.7–5.3)
Sodium: 142 mEq/L (ref 137–147)
TOTAL PROTEIN: 7.7 g/dL (ref 6.0–8.3)

## 2013-09-02 LAB — URINE MICROSCOPIC-ADD ON

## 2013-09-02 LAB — GLUCOSE, CAPILLARY: GLUCOSE-CAPILLARY: 217 mg/dL — AB (ref 70–99)

## 2013-09-02 LAB — CYTOMEGALOVIRUS PCR, QUALITATIVE: CYTOMEGALOVIRUS DNA: NOT DETECTED

## 2013-09-02 LAB — TROPONIN I

## 2013-09-02 LAB — MRSA PCR SCREENING: MRSA by PCR: NEGATIVE

## 2013-09-02 LAB — CBG MONITORING, ED: Glucose-Capillary: 259 mg/dL — ABNORMAL HIGH (ref 70–99)

## 2013-09-02 MED ORDER — METOPROLOL TARTRATE 1 MG/ML IV SOLN
2.5000 mg | INTRAVENOUS | Status: DC | PRN
  Administered 2013-09-03 – 2013-09-05 (×3): 5 mg via INTRAVENOUS
  Filled 2013-09-02 (×3): qty 5

## 2013-09-02 MED ORDER — HYDRALAZINE HCL 20 MG/ML IJ SOLN
10.0000 mg | INTRAMUSCULAR | Status: DC | PRN
  Administered 2013-09-03: 20 mg via INTRAVENOUS
  Administered 2013-09-03: 10 mg via INTRAVENOUS
  Administered 2013-09-04 (×2): 20 mg via INTRAVENOUS
  Administered 2013-09-04: 10 mg via INTRAVENOUS
  Filled 2013-09-02 (×5): qty 1

## 2013-09-02 MED ORDER — FAMOTIDINE IN NACL 20-0.9 MG/50ML-% IV SOLN: 20.0000 mg | INTRAVENOUS | Status: DC

## 2013-09-02 MED ORDER — LORAZEPAM 2 MG/ML IJ SOLN
2.0000 mg | Freq: Once | INTRAMUSCULAR | Status: AC
Start: 2013-09-02 — End: 2013-09-02
  Administered 2013-09-02: 2 mg via INTRAVENOUS

## 2013-09-02 MED ORDER — SODIUM CHLORIDE 0.9 % IV SOLN
1000.0000 mg | Freq: Once | INTRAVENOUS | Status: AC
  Administered 2013-09-02: 1000 mg via INTRAVENOUS
  Filled 2013-09-02: qty 10

## 2013-09-02 MED ORDER — LORAZEPAM 2 MG/ML IJ SOLN
INTRAMUSCULAR | Status: AC
  Administered 2013-09-02: 2 mg via INTRAVENOUS
  Filled 2013-09-02: qty 1

## 2013-09-02 MED ORDER — FAMOTIDINE IN NACL 20-0.9 MG/50ML-% IV SOLN
20.0000 mg | INTRAVENOUS | Status: DC
  Administered 2013-09-02 – 2013-09-03 (×2): 20 mg via INTRAVENOUS
  Filled 2013-09-02 (×3): qty 50

## 2013-09-02 MED ORDER — LORAZEPAM 2 MG/ML IJ SOLN
2.0000 mg | Freq: Once | INTRAMUSCULAR | Status: AC
  Administered 2013-09-02: 2 mg via INTRAVENOUS
  Filled 2013-09-02: qty 1

## 2013-09-02 MED ORDER — KCL IN DEXTROSE-NACL 20-5-0.45 MEQ/L-%-% IV SOLN
INTRAVENOUS | Status: DC
  Administered 2013-09-02 – 2013-09-04 (×2): via INTRAVENOUS
  Filled 2013-09-02 (×7): qty 1000

## 2013-09-02 MED ORDER — INSULIN ASPART 100 UNIT/ML ~~LOC~~ SOLN
0.0000 [IU] | SUBCUTANEOUS | Status: DC
  Administered 2013-09-02: 5 [IU] via SUBCUTANEOUS
  Administered 2013-09-03: 2 [IU] via SUBCUTANEOUS
  Administered 2013-09-03 (×4): 3 [IU] via SUBCUTANEOUS
  Administered 2013-09-03: 2 [IU] via SUBCUTANEOUS
  Administered 2013-09-04 (×3): 3 [IU] via SUBCUTANEOUS
  Administered 2013-09-04: 2 [IU] via SUBCUTANEOUS
  Administered 2013-09-04: 3 [IU] via SUBCUTANEOUS
  Administered 2013-09-04 – 2013-09-05 (×3): 2 [IU] via SUBCUTANEOUS
  Administered 2013-09-05: 5 [IU] via SUBCUTANEOUS

## 2013-09-02 MED ORDER — SODIUM CHLORIDE 0.9 % IV BOLUS (SEPSIS): 500.0000 mL | Freq: Once | INTRAVENOUS | Status: DC

## 2013-09-02 MED ORDER — INSULIN GLARGINE 100 UNIT/ML ~~LOC~~ SOLN
20.0000 [IU] | Freq: Every day | SUBCUTANEOUS | Status: DC
  Administered 2013-09-02 – 2013-09-04 (×3): 20 [IU] via SUBCUTANEOUS
  Filled 2013-09-02 (×5): qty 0.2

## 2013-09-02 MED ORDER — HEPARIN SODIUM (PORCINE) 5000 UNIT/ML IJ SOLN
5000.0000 [IU] | Freq: Three times a day (TID) | INTRAMUSCULAR | Status: DC
  Administered 2013-09-02 – 2013-09-05 (×9): 5000 [IU] via SUBCUTANEOUS
  Filled 2013-09-02 (×11): qty 1

## 2013-09-02 MED ORDER — SODIUM CHLORIDE 0.9 % IV SOLN: 250.0000 mL | INTRAVENOUS | Status: DC | PRN

## 2013-09-02 MED ORDER — SODIUM CHLORIDE 0.9 % IV BOLUS (SEPSIS)
1000.0000 mL | Freq: Once | INTRAVENOUS | Status: AC
  Administered 2013-09-02: 1000 mL via INTRAVENOUS

## 2013-09-02 MED ORDER — SODIUM CHLORIDE 0.9 % IV SOLN
750.0000 mg | Freq: Two times a day (BID) | INTRAVENOUS | Status: DC
  Filled 2013-09-02 (×2): qty 7.5

## 2013-09-02 MED ORDER — SODIUM CHLORIDE 0.9 % IV SOLN
750.0000 mg | Freq: Two times a day (BID) | INTRAVENOUS | Status: DC
  Administered 2013-09-03 – 2013-09-04 (×3): 750 mg via INTRAVENOUS
  Filled 2013-09-02 (×6): qty 7.5

## 2013-09-02 MED ORDER — BIOTENE DRY MOUTH MT LIQD
15.0000 mL | Freq: Two times a day (BID) | OROMUCOSAL | Status: DC
  Administered 2013-09-02 – 2013-09-05 (×5): 15 mL via OROMUCOSAL

## 2013-09-02 NOTE — Progress Notes (Signed)
Chaplain Note:  Chaplain responded to patient's request for prayer. Patient was being prepped to be taken down for surgery. Chaplain prayed with patient and her son. While patient was in surgery, Chaplain provided spiritual and emotional support to her son. Possible follow-up. [name], [place]

## 2013-09-02 NOTE — Consult Note (Addendum)
NEURO HOSPITALIST CONSULT NOTE    Reason for Consult: seizures  HPI:                                                                                                                                          [name] is an 58 y.o. female with a past medical history relevant for  of hypertension, diabetes mellitus, peripheral vascular disease with BKA amputation bilaterally, hyperlipidemia, CAD, depression,  and generalized seizure 8 years ago, discharged from [facility] yesterday with a diagnosis of left frontal lone mass with symptomatic seizures, brought back to the ED today after sustaining further seizures at home this morning. Upon arrival to ED she was poorly responsive with right gaze deviation, nystagmoid like movements and received IV ativan (total dose 4 mg) and is currently receiving 1 gram IV keppra. Family said that she didn't take her keppra this morning. On further questioning, family said that when she got home yesterday she was " kind of confused and no doing things properly". Woke up this morning doing better but had a GTC seizure around 10:30 AM this morning and son reported that patient was shaking for approximately 5 minutes and then became lethargic. As per son, reported the patient and lethargic with a rightward gaze since 10:40 AM this morning. No fever, chills, or falls. At this moment she is unresponsive. CT brain showed no acute intracranial abnormality. Most recent work up here demonstrated a left frontal mass with vasogenic edema, and CSF that was unrevealing ( cytology pending).   Past Medical History  Diagnosis Date  . Hypertension   . Neuropathy   . Peripheral vascular disease   . Coronary artery disease   . Anxiety   . Depression   . Anemia   . Type II diabetes mellitus   . High cholesterol   . History of blood transfusion     "one time; related to diabetes I guess"   . Arthritis     "hands"  . GERD (gastroesophageal reflux disease)      takes Zantac  . Complication of anesthesia     pt felt like she had a hard time waking up after surgery in Feb. 2015.  Marland Kitchen CHF (congestive heart failure)     Past Surgical History  Procedure Laterality Date  . Toe amputation Right     "took off a couple toes before the amputation"  . Below knee leg amputation Right Jan. 2008  . Carotid endarterectomy Right   . Cholecystectomy    . Ankle fusion Left 05/11/2013    TIBIOCALCANEAL FUSION   . Tonsillectomy    . Dilation and curettage of uterus    . Coronary artery bypass graft  12/01/2007    "CABG X4" (05/12/2013)  . Ankle fusion Left  05/12/2013    Procedure: LEFT TIBIOCALCANEAL FUSION;  Surgeon: Newt Minion, MD;  Location: [place];  Service: Orthopedics;  Laterality: Left;  Left Tibiocalcaneal Fusion  . Colonoscopy    . Irrigation and debridement abscess Left 06/17/2013    ANKLE           DR [name]  . I&d extremity Left 06/17/2013    Procedure: IRRIGATION AND DEBRIDEMENT EXTREMITY, PLACEMENT ANTIBIOTIC STIMULAN BEADS;  Surgeon: Newt Minion, MD;  Location: [place];  Service: Orthopedics;  Laterality: Left;  . Application of wound vac  06/17/2013    Procedure: APPLICATION OF WOUND VAC;  Surgeon: Newt Minion, MD;  Location: [place];  Service: Orthopedics;;  . Amputation Left 08/22/2013    Procedure: LEFT BELOW KNEE AMPUTATION;  Surgeon: Newt Minion, MD;  Location: [place];  Service: Orthopedics;  Laterality: Left;    Family History  Problem Relation Age of Onset  . Cancer Mother   . Heart disease Father   . Diabetes Father   . Hyperlipidemia Father   . Hypertension Father     Social History:  reports that she has never smoked. She has never used smokeless tobacco. She reports that she does not drink alcohol or use illicit drugs.  Allergies  Allergen Reactions  . Tape Rash    Redness, rash, itchiness    MEDICATIONS:                                                                                                                      Reviewed.  ROS:  Unable to obtain due to mental status.                                                                                                                    History obtained from family and chart review.  Physical exam: unresponsive, s/p IV ativan. Blood pressure 197/80, pulse 111, temperature 97.7 F (36.5 C), temperature source Rectal, resp. rate 22, SpO2 96.00%. Head: normocephalic. Neck: supple, no bruits, no JVD. Cardiac: no murmurs. Lungs: clear. Abdomen: soft, no tender, no mass. Extremities: s/p bilateral BKA Neurologic Examination:  Mental Status: S/P IV ATIVAN. Unresponsive Cranial Nerves: Pupils 4 mm bilaterally, reactive. No gaze preference. Doesn't blink to threat. Face appears to be symmetric. Tongue midline. Motor: No spontaneous or pain induced movement but s/p ativan Sensory: s/p ativan. Deep Tendon Reflexes:  1+ all over Plantars: S/p BKA Cerebellar: Unable to test Gait:  Unable to test   Lab Results  Component Value Date/Time   CHOL 113 08/30/2013  5:50 AM    Results for orders placed during the hospital encounter of 09/02/13 (from the past 48 hour(s))  CBG MONITORING, ED     Status: Abnormal   Collection Time    09/02/13  2:42 PM      Result Value Ref Range   Glucose-Capillary 259 (*) 70 - 99 mg/dL   Comment 1 Notify RN    CBC WITH DIFFERENTIAL     Status: Abnormal   Collection Time    09/02/13  2:45 PM      Result Value Ref Range   WBC 8.8  4.0 - 10.5 K/uL   RBC 3.94  3.87 - 5.11 MIL/uL   Hemoglobin 9.7 (*) 12.0 - 15.0 g/dL   HCT 31.8 (*) 36.0 - 46.0 %   MCV 80.7  78.0 - 100.0 fL   MCH 24.6 (*) 26.0 - 34.0 pg   MCHC 30.5  30.0 - 36.0 g/dL   RDW 18.0 (*) 11.5 - 15.5 %   Platelets 429 (*) 150 - 400 K/uL   Neutrophils Relative % 91 (*) 43 - 77 %   Neutro Abs 8.0 (*) 1.7 - 7.7 K/uL   Lymphocytes Relative 5 (*) 12 - 46 %   Lymphs  Abs 0.5 (*) 0.7 - 4.0 K/uL   Monocytes Relative 3  3 - 12 %   Monocytes Absolute 0.3  0.1 - 1.0 K/uL   Eosinophils Relative 1  0 - 5 %   Eosinophils Absolute 0.0  0.0 - 0.7 K/uL   Basophils Relative 0  0 - 1 %   Basophils Absolute 0.0  0.0 - 0.1 K/uL    Ct Head Wo Contrast  09/02/2013   CLINICAL DATA:  SEIZURES  EXAM: CT HEAD WITHOUT CONTRAST  TECHNIQUE: Contiguous axial images were obtained from the base of the skull through the vertex without intravenous contrast.  COMPARISON:  Head CT dated 08/29/2013, Brain MRI dated 08/30/2013  FINDINGS: The diffuse area of low-attenuation within the centrum semiovale and corona radiata regions on the left is stable. There is no evidence of mass effect. No evidence of acute hemorrhage. No extra-axial fluid collections. No acute territorial infarction. The ventricles and cisterns are patent.  Visualized paranasal sinuses are unremarkable. No acute cranial abnormalities.  IMPRESSION: Stable findings in the left cerebral hemisphere consistent with an infiltrative process on MRI. No acute intracranial abnormality.   Electronically Signed   By: Margaree Mackintosh M.D.   On: 09/02/2013 14:21   Dg Chest Port 1 View  09/02/2013   CLINICAL DATA:  lethargic  EXAM: PORTABLE CHEST - 1 VIEW  COMPARISON:  Portal chest radiograph 08/29/2013.  FINDINGS: Low lung volumes. Cardiac silhouette stable. Stable median sternotomy and coronary artery bypass grafting changes. No focal infiltrates, regions of consolidation, or effusions. Degenerative changes within the shoulders. No acute osseous abnormalities.  IMPRESSION: Low lung volumes without evidence of focal or acute cardiopulmonary disease.   Electronically Signed   By: Margaree Mackintosh M.D.   On: 09/02/2013 13:43   Dg Fluoro Guide Lumbar Puncture  08/31/2013   CLINICAL  DATA:  58 year old female with altered mental status, seizure, abnormal brain MRI. Diagnostic lumbar puncture for CSF analysis requested under fluoroscopic guidance due  to the patient being on Plavix. Initial encounter.  EXAM: DIAGNOSTIC LUMBAR PUNCTURE UNDER FLUOROSCOPIC GUIDANCE  FLUOROSCOPY TIME:  0 min and 21 seconds.  PROCEDURE: The patient underwent platelet transfusion prior to the procedure.  Informed consent was obtained from the patient and her son prior to the procedure, including potential complications of headache, allergy, and pain. A "time-out" was performed.  With the patient prone, the lower back was prepped with Betadine. 1% Lidocaine was used for local anesthesia. Lumbar puncture was performed at the L4-L5 level using right sub laminar technique, a 3.5 inch x 20 gauge needle with return of clear, colorless CSF with an opening pressure of 17 cm water. 42ml of CSF were obtained for laboratory studies.  The needle was withdrawn, direct pressure was held, and hemostasis was noted. Appropriate postprocedural orders were placed. The patient tolerated the procedure well and there were no apparent complications.  IMPRESSION: Diagnostic lumbar puncture at L4-L5. Opening pressure 17 cm of water. Clear, colorless CSF sent to the lab for analysis.   Electronically Signed   By: Lars Pinks M.D.   On: 08/31/2013 16:30   Assessment/Plan: 58 y/o with structural GTC seizures in the context of a left frontal mass of unclear etiology ( primary CNS tumor vs brain metastasis, unlikely infection), brought in with GTC SE that stopped after IV ativan 4 mg and 1 gram loading dose IV keppra. Concern about NCSE, although her unresponsiveness could be due to prolonged post ictal state or sedation. Worsening cerebral vasogenic edema may be the culprit for current presentation. Plan: 1) STAT EEG to ruled out NCSE. 2) MRI brain with and without contrast. 3) CT chest abdomen and pelvis. 4) Keppra IV 750 mg BID. 5) Decadron 4 mg IV q 6 if evidence of vasogenic edema on MRI 6) Will get neurosurgery input depending on MRI results. Will follow up.     Dorian Pod, MD 09/02/2013,  3:01 PM

## 2013-09-02 NOTE — Progress Notes (Signed)
STAT EEG completed; results pending. 

## 2013-09-02 NOTE — ED Provider Notes (Signed)
CSN: [id number]     Arrival date & time 09/02/13  1214 History   First MD Initiated Contact with Patient 09/02/13 1215     Chief Complaint  Patient presents with  . Seizures     (Consider location/radiation/quality/duration/timing/severity/associated sxs/prior Treatment) The history is provided by a relative and the spouse. No language interpreter was used.  [name] is a 58 y/o F with PMHx of HTN, neuropathy, CAD, anxiety, depression, CHF, DM with seizure in 2009x1 and seizure on 08/28/2013 with patient is admitted. Patient was just discharged from the hospital yesterday. Patient had an episode of seizure at approximately 10:30 AM this morning were son reported that patient was shaking for approximately 5 minutes opening the parietal smoking around and then became lethargic. As per son, reported the patient and lethargic with a rightward gaze since 10:40 AM this morning. Stated that patient has not taken any of her medications this morning. As per son and husband, reported the patient's personality has changed-reported the patient appeared to be mildly confused yesterday morning, in and out of confusion. Last seen normal at approximately 7:00 AM this morning her son woke patient up and the patient was interactive. As per family, denied fever, chills, head injury, fall, alcohol or smoking. PCP Dr. Amanda Cockayne   Past Medical History  Diagnosis Date  . Hypertension   . Neuropathy   . Peripheral vascular disease   . Coronary artery disease   . Anxiety   . Depression   . Anemia   . Type II diabetes mellitus   . High cholesterol   . History of blood transfusion     "one time; related to diabetes I guess"   . Arthritis     "hands"  . GERD (gastroesophageal reflux disease)     takes Zantac  . Complication of anesthesia     pt felt like she had a hard time waking up after surgery in Feb. 2015.  Marland Kitchen CHF (congestive heart failure)    Past Surgical History  Procedure Laterality Date  .  Toe amputation Right     "took off a couple toes before the amputation"  . Below knee leg amputation Right Jan. 2008  . Carotid endarterectomy Right   . Cholecystectomy    . Ankle fusion Left 05/11/2013    TIBIOCALCANEAL FUSION   . Tonsillectomy    . Dilation and curettage of uterus    . Coronary artery bypass graft  12/01/2007    "CABG X4" (05/12/2013)  . Ankle fusion Left 05/12/2013    Procedure: LEFT TIBIOCALCANEAL FUSION;  Surgeon: Newt Minion, MD;  Location: [place];  Service: Orthopedics;  Laterality: Left;  Left Tibiocalcaneal Fusion  . Colonoscopy    . Irrigation and debridement abscess Left 06/17/2013    ANKLE           DR [name]  . I&d extremity Left 06/17/2013    Procedure: IRRIGATION AND DEBRIDEMENT EXTREMITY, PLACEMENT ANTIBIOTIC STIMULAN BEADS;  Surgeon: Newt Minion, MD;  Location: [place];  Service: Orthopedics;  Laterality: Left;  . Application of wound vac  06/17/2013    Procedure: APPLICATION OF WOUND VAC;  Surgeon: Newt Minion, MD;  Location: [place];  Service: Orthopedics;;  . Amputation Left 08/22/2013    Procedure: LEFT BELOW KNEE AMPUTATION;  Surgeon: Newt Minion, MD;  Location: [place];  Service: Orthopedics;  Laterality: Left;   Family History  Problem Relation Age of Onset  . Cancer Mother   . Heart disease  Father   . Diabetes Father   . Hyperlipidemia Father   . Hypertension Father    History  Substance Use Topics  . Smoking status: Never Smoker   . Smokeless tobacco: Never Used  . Alcohol Use: No   OB History   Grav Para Term Preterm Abortions TAB SAB Ect Mult Living                 Review of Systems  Unable to perform ROS: Mental status change      Allergies  Tape  Home Medications   Prior to Admission medications   Medication Sig Start Date End Date Taking? Authorizing Provider  amitriptyline (ELAVIL) 25 MG tablet Take 25 mg by mouth at bedtime as needed for sleep.    Yes Historical Provider, MD  aspirin 81 MG tablet Take 81 mg by mouth daily.      Yes Historical Provider, MD  atorvastatin (LIPITOR) 80 MG tablet Take 80 mg by mouth daily.     Yes Historical Provider, MD  clopidogrel (PLAVIX) 75 MG tablet Take 75 mg by mouth daily.    Yes Historical Provider, MD  hydrALAZINE (APRESOLINE) 50 MG tablet Take 1 tablet (50 mg total) by mouth every 8 (eight) hours. 09/01/13  Yes Thurnell Lose, MD  insulin NPH Human (HUMULIN N,NOVOLIN N) 100 UNIT/ML injection Inject 25-35 Units into the skin 2 (two) times daily before a meal. 35 units in the morning and 25 units in the evening   Yes Historical Provider, MD  isosorbide mononitrate (IMDUR) 30 MG 24 hr tablet Take 1 tablet (30 mg total) by mouth daily. 09/01/13  Yes Thurnell Lose, MD  levETIRAcetam (KEPPRA) 500 MG tablet Take 1 tablet (500 mg total) by mouth 2 (two) times daily. 09/01/13  Yes Thurnell Lose, MD  metFORMIN (GLUCOPHAGE) 500 MG tablet Take 1 tablet (500 mg total) by mouth 4 (four) times daily. 09/02/13  Yes Thurnell Lose, MD  metoprolol tartrate (LOPRESSOR) 50 MG tablet Take 0.5 tablets (25 mg total) by mouth 2 (two) times daily. 09/01/13  Yes Thurnell Lose, MD  Multiple Vitamins-Minerals (MULTIVITAMINS THER. W/MINERALS) TABS Take 1 tablet by mouth daily.     Yes Historical Provider, MD  oxyCODONE-acetaminophen (ROXICET) 5-325 MG per tablet Take 1 tablet by mouth every 4 (four) hours as needed for severe pain. 05/13/13  Yes Newt Minion, MD  quinapril (ACCUPRIL) 40 MG tablet Take 40 mg by mouth daily.    Yes Historical Provider, MD  ranitidine (ZANTAC) 150 MG tablet Take 150 mg by mouth 2 (two) times daily.    Yes Historical Provider, MD  sertraline (ZOLOFT) 100 MG tablet Take 100 mg by mouth 2 (two) times daily.    Yes Historical Provider, MD   BP 143/124  Pulse 110  Temp(Src) 97.7 F (36.5 C) (Rectal)  Resp 23  SpO2 100% Physical Exam  Nursing note and vitals reviewed. Constitutional: She appears well-developed and well-nourished. No distress.  Lethargic  HENT:  Head:  Normocephalic and atraumatic.  Dry mucous membranes  Eyes: Conjunctivae and EOM are normal. Pupils are equal, round, and reactive to light. Right eye exhibits no discharge. Left eye exhibits no discharge.  Right-sided gaze with horizontal nystagmus noted  Neck: Normal range of motion. Neck supple.  Cardiovascular: Normal rate, regular rhythm and normal heart sounds.  Exam reveals no friction rub.   No murmur heard. Pulses:      Radial pulses are 2+ on the right side, and  2+ on the left side.  Pulmonary/Chest: Effort normal and breath sounds normal. No respiratory distress. She has no wheezes. She has no rales.  Musculoskeletal:  No range of motion noted extremities-negative shaking or tremors identified Bilateral below the knee amputations with new amputation to the LLE. New below the knee amputation of the right lower tremors identified with staples placed to-negative swelling, erythema, inflammation, lesions, sores identified. Ecchymosis identified to the stump. Negative warmth upon palpation. Negative drainage or bleeding identified.  Neurological:  Patient does not follow commands Patient currently seizing, subconsciously  Skin: Skin is warm and dry. No rash noted. She is not diaphoretic. No erythema.  Psychiatric: She has a normal mood and affect. Her behavior is normal. Thought content normal.    ED Course  Procedures (including critical care time)  1:12 PM Patient seen and assessed by attending physician who recommended IV Keppra 1,000 mg and Ativan 2 mg - patient still seizing at this time.   1:32 PM Continuous deviation to the right side with negative nystagmus. Discussed with Dr. Maryan Rued - physician recommended another 2 mg of ativan to be given.   1:53 PM Patient seen by attending and patient has broke her seizure episode.  2:34 PM This provider spoke with Dr. Aram Beecham, neurologist-discussed case and imaging in great detail. Neurology to come and assess patient.  2:40 PM  This provider spoke with family at bedside-discussed plan for neurology consult. Family agreed to plan of care.   3:10 PM Dr. Aram Beecham, Neurologist at bedside assessing patient.   3:53 PM This provider spoke with Dr. Candiss Norse, triad hospitalist. As per Dr. Keturah Barre reported that he spoke with Dr. Aram Beecham who recommended patient be admitted to ICU for status epilepticus. Patient to be admitted to neuro ICU.  4:09 PM This provider spoke with ICU physician/Intensivist. Discussed case, history, labs and imaging in great detail. Intensivists to come and assess patient.  CLINICAL DATA: Loss of consciousness. Seizure like activity.  EXAM:  CT HEAD WITHOUT CONTRAST  TECHNIQUE:  Contiguous axial images were obtained from the base of the skull  through the vertex without intravenous contrast.  COMPARISON: 10/05/2007.  FINDINGS:  No midline shift, hydrocephalus, hemorrhage. No acute territorial  cortical ischemia/infarct. Atrophy and chronic ischemic white matter  disease is present. Motion artifact is present on the examination.  The patient was moving. The slices were repeated. Intracranial  atherosclerosis is present. Mild ethmoid air cell opacification on  the right. The mastoid air cells appear normal. Sphenoid sinuses  clear.  IMPRESSION:  1. Low attenuation in the left corona radiata and centrum semiovale  is suspicious for vasogenic edema. This could represent asymmetric  chronic ischemic white matter disease however the amount of  progression since 2009 would be unusual. Followup MRI recommended  for further assessment, preferably with and without contrast.  2. Study mildly degraded by motion artifact.  3. No definite acute intracranial abnormality.  CLINICAL DATA: Altered mental status. Diabetes. Generalized  seizure, elevated blood pressure.  EXAM:  MRI HEAD WITHOUT CONTRAST  TECHNIQUE:  Multiplanar, multiecho pulse sequences of the brain and surrounding  structures were obtained  without intravenous contrast.  COMPARISON: CT head 08/29/2013. MR head 10/05/2007.  FINDINGS:  The study is markedly degraded by motion. In addition, post infusion  imaging could not be obtained despite IV administration of contrast.  Therefore the study is of only limited usefulness.  Re-demonstrated from today's CT is an asymmetric area of edema in  the left hemisphere primarily affecting the anterior  frontal cortex  within and just anterior to the supplemental motor area. The cortex  displays T2 and FLAIR hyperintensity, and there is abnormal signal  extending caudally as far as the posterior limb internal capsule on  the left.  There is no midline shift. No other similar areas are seen. No  restricted diffusion to suggest acute stroke. No acute or chronic  hemorrhage.  Premature for age cerebral and cerebellar atrophy. Premature for age  chronic microvascular ischemic change. Flow voids are maintained. No  acute sinus or mastoid disease. Negative orbits.  Compared with prior MR, the atrophy and small vessel disease has  progressed. There was no mass lesion in the left hemisphere in 2009.  IMPRESSION:  Infiltrative mass in the left frontal cortex with probable  associated vasogenic edema extending caudally. Considerations  include primary brain tumor, metastatic disease, or intracranial  infection, either bacterial or viral (PML). If the patient is more  cooperative, post infusion imaging could be re-attempted, possibly  providing more specificity. Alternately, tissue sampling could be  performed.  Electronically Signed  By: Rolla Flatten M.D.  On: 08/29/2013 13:40  Results for orders placed during the hospital encounter of 09/02/13  CBC WITH DIFFERENTIAL      Result Value Ref Range   WBC 8.8  4.0 - 10.5 K/uL   RBC 3.94  3.87 - 5.11 MIL/uL   Hemoglobin 9.7 (*) 12.0 - 15.0 g/dL   HCT 31.8 (*) 36.0 - 46.0 %   MCV 80.7  78.0 - 100.0 fL   MCH 24.6 (*) 26.0 - 34.0 pg   MCHC 30.5   30.0 - 36.0 g/dL   RDW 18.0 (*) 11.5 - 15.5 %   Platelets 429 (*) 150 - 400 K/uL   Neutrophils Relative % 91 (*) 43 - 77 %   Neutro Abs 8.0 (*) 1.7 - 7.7 K/uL   Lymphocytes Relative 5 (*) 12 - 46 %   Lymphs Abs 0.5 (*) 0.7 - 4.0 K/uL   Monocytes Relative 3  3 - 12 %   Monocytes Absolute 0.3  0.1 - 1.0 K/uL   Eosinophils Relative 1  0 - 5 %   Eosinophils Absolute 0.0  0.0 - 0.7 K/uL   Basophils Relative 0  0 - 1 %   Basophils Absolute 0.0  0.0 - 0.1 K/uL  COMPREHENSIVE METABOLIC PANEL      Result Value Ref Range   Sodium 142  137 - 147 mEq/L   Potassium 3.8  3.7 - 5.3 mEq/L   Chloride 100  96 - 112 mEq/L   CO2 27  19 - 32 mEq/L   Glucose, Bld 267 (*) 70 - 99 mg/dL   BUN 17  6 - 23 mg/dL   Creatinine, Ser 1.25 (*) 0.50 - 1.10 mg/dL   Calcium 8.8  8.4 - 10.5 mg/dL   Total Protein 7.7  6.0 - 8.3 g/dL   Albumin 2.7 (*) 3.5 - 5.2 g/dL   AST 25  0 - 37 U/L   ALT 17  0 - 35 U/L   Alkaline Phosphatase 123 (*) 39 - 117 U/L   Total Bilirubin 0.5  0.3 - 1.2 mg/dL   GFR calc non Af Amer 47 (*) >90 mL/min   GFR calc Af Amer 55 (*) >90 mL/min  TROPONIN I      Result Value Ref Range   Troponin I <0.30  <0.30 ng/mL  URINALYSIS, ROUTINE W REFLEX MICROSCOPIC      Result Value Ref Range  Color, Urine YELLOW  YELLOW   APPearance CLEAR  CLEAR   Specific Gravity, Urine 1.016  1.005 - 1.030   pH 6.5  5.0 - 8.0   Glucose, UA 100 (*) NEGATIVE mg/dL   Hgb urine dipstick MODERATE (*) NEGATIVE   Bilirubin Urine NEGATIVE  NEGATIVE   Ketones, ur 15 (*) NEGATIVE mg/dL   Protein, ur >300 (*) NEGATIVE mg/dL   Urobilinogen, UA 0.2  0.0 - 1.0 mg/dL   Nitrite NEGATIVE  NEGATIVE   Leukocytes, UA NEGATIVE  NEGATIVE  URINE MICROSCOPIC-ADD ON      Result Value Ref Range   Squamous Epithelial / LPF RARE  RARE   WBC, UA 0-2  <3 WBC/hpf   RBC / HPF 0-2  <3 RBC/hpf   Casts HYALINE CASTS (*) NEGATIVE  CBG MONITORING, ED      Result Value Ref Range   Glucose-Capillary 259 (*) 70 - 99 mg/dL   Comment 1  Notify RN      Labs Review Labs Reviewed  CBC WITH DIFFERENTIAL  COMPREHENSIVE METABOLIC PANEL  TROPONIN I  URINALYSIS, ROUTINE W REFLEX MICROSCOPIC  CBG MONITORING, ED    Imaging Review Dg Fluoro Guide Lumbar Puncture  08/31/2013   CLINICAL DATA:  58 year old female with altered mental status, seizure, abnormal brain MRI. Diagnostic lumbar puncture for CSF analysis requested under fluoroscopic guidance due to the patient being on Plavix. Initial encounter.  EXAM: DIAGNOSTIC LUMBAR PUNCTURE UNDER FLUOROSCOPIC GUIDANCE  FLUOROSCOPY TIME:  0 min and 21 seconds.  PROCEDURE: The patient underwent platelet transfusion prior to the procedure.  Informed consent was obtained from the patient and her son prior to the procedure, including potential complications of headache, allergy, and pain. A "time-out" was performed.  With the patient prone, the lower back was prepped with Betadine. 1% Lidocaine was used for local anesthesia. Lumbar puncture was performed at the L4-L5 level using right sub laminar technique, a 3.5 inch x 20 gauge needle with return of clear, colorless CSF with an opening pressure of 17 cm water. 84ml of CSF were obtained for laboratory studies.  The needle was withdrawn, direct pressure was held, and hemostasis was noted. Appropriate postprocedural orders were placed. The patient tolerated the procedure well and there were no apparent complications.  IMPRESSION: Diagnostic lumbar puncture at L4-L5. Opening pressure 17 cm of water. Clear, colorless CSF sent to the lab for analysis.   Electronically Signed   By: Lars Pinks M.D.   On: 08/31/2013 16:30     EKG Interpretation   Date/Time:  Friday September 02 2013 12:15:07 EDT Ventricular Rate:  110 PR Interval:  158 QRS Duration: 104 QT Interval:  352 QTC Calculation: 476 R Axis:   47 Text Interpretation:  Sinus tachycardia Right atrial enlargement LVH with  secondary repolarization abnormality Anterior Q waves, possibly due to LVH  No  significant change since last tracing Confirmed by Maryan Rued  MD,  Loree Fee ([id number]) on 09/02/2013 12:22:38 PM      CRITICAL CARE Performed by: Jamse Mead   Total critical care time: 30  Critical care time was exclusive of separately billable procedures and treating other patients.  Critical care was necessary to treat or prevent imminent or life-threatening deterioration.  Critical care was time spent personally by me on the following activities: development of treatment plan with patient and/or surrogate as well as nursing, discussions with consultants, evaluation of patient's response to treatment, examination of patient, obtaining history from patient or surrogate, ordering and performing treatments and interventions,  ordering and review of laboratory studies, ordering and review of radiographic studies, pulse oximetry and re-evaluation of patient's condition.  MDM   Final diagnoses:  None    Medications  levETIRAcetam (KEPPRA) 1,000 mg in sodium chloride 0.9 % 100 mL IVPB (not administered)  LORazepam (ATIVAN) injection 2 mg (not administered)  sodium chloride 0.9 % bolus 500 mL (not administered)  LORazepam (ATIVAN) injection 2 mg (2 mg Intravenous Given 09/02/13 1317)  sodium chloride 0.9 % bolus 1,000 mL (1,000 mLs Intravenous New Bag/Given 09/02/13 1326)   Filed Vitals:   09/02/13 1230 09/02/13 1245 09/02/13 1300 09/02/13 1315  BP: 189/91 191/98 196/82 197/80  Pulse: 110 108 110 111  Temp:      TempSrc:      Resp: 24 26 20 22   SpO2: 99% 97% 97% 96%   This provider reviewed patient's chart. Patient was seen and assessed on 08/28/2013 where she was admitted for seizure activity. CSF study negative for meningitis, resulted on 08/31/2013-performed secondary to seizure of unknown source. MR of brain performed on 08/29/2013 identified in infiltrative mass of the left frontal cortex with associated vasogenic edema - possible infection versus tumor versus metastatic  disease. Patient presenting to the ED continues to be seizing with rightward gaze and horizontal nystagmus. After first round of 2 mg of Ativan patient's nystagmus has discontinued, but continues to have rightward gaze.  EKG noted sinus tachycardia with a heart rate 110 beats per minute with right atrial enlargement-no significant changes since last tracing. Troponin negative elevation. CBC negative elevated white blood cell count. Elevated platelets of 429. Hemoglobin of 9.7-consistently low when compared to previous labs. CMP noted mildly elevated creatinine 1.25. Elevated alkaline phosphatase of 123. Urinalysis noted moderate hemoglobin with elevated protein-no signs of infection noted. Low lung volumes without evidence of focal or acute cardiac primary disease noted on chest x-ray. CT head stable findings in the left cervical hemisphere consistent with an infiltrative process on MRI-no acute intracranial abnormalities identified. Patient seen and assessed by neurology while in ED setting who recommended patient be admitted to internal medicine services. Neurologist placed orders for CT of abdomen and pelvis as well as chest to rule out possible metastasis. Neurology to follow patient. Patient presenting to the ED with ongoing seizures-status epilepticus. Patient was just discharged yesterday regarding new onset of seizures on 08/28/2013. MRI as well as LP was performed with negative findings of infection that could lead up to the seizures. CT scan today negative for hemorrhage or acute abnormalities identified. Patient to be admitted to ICU as per request for neurology. Patient stable for transfer. Family informed of decision and agreed to plan of care.   Jamse Mead, PA-C 09/02/13 2154

## 2013-09-02 NOTE — ED Notes (Signed)
Admitting MD at bedside.

## 2013-09-02 NOTE — H&P (Addendum)
PULMONARY / CRITICAL CARE MEDICINE   Name: [name] MRN: [id number] DOB: Oct 12, 1955    ADMISSION DATE:  09/02/2013  REFERRING MD :  EDP PRIMARY SERVICE: PCCM   BRIEF PATIENT DESCRIPTION:  77 F hospitalized by [facility] 6/15-6/18 with new onset seizures. Eval included head CT and MRI brain with abnormality in L frontal lobe And negative LP. Presented again to ED 6/19 with seizures which abated with lorazepam X 2 doses. Neurology evaluated in ED and asked PCCM to admit to ICU. Pt also s/p L BKA 6/08 for LLE abscess and osteomyelitis  STUDIES DURING LAST ADMISSION: 6/15 CT head: Low attenuation in the left corona radiata and centrum semiovale is suspicious for vasogenic edema. This could represent asymmetric chronic ischemic white matter disease however the amount of progression since 2009 would be unusual. Followup MRI recommended for further assessment, preferably with and without contrast 6/15 CT chest: No evidence of pulmonary embolus. Minimal bilateral dependent atelectasis noted; lungs otherwise clear. Diffuse coronary artery calcifications seen. Splenomegaly noted 6/15 MRI brain: Infiltrative mass in the left frontal cortex with probable associated vasogenic edema extending caudally. Considerations include primary brain tumor, metastatic disease, or intracranial infection, either bacterial or viral (PML). If the patient is more cooperative, post infusion imaging could be re-attempted, possibly providing more specificity. Alternately, tissue sampling could be performed. 6/16 MRI brain: Motion degraded exam despite repeated imaging attempts, image quality less than that obtained on yesterday's study. No convincing enhancement to correspond to the left frontal lobe lesion. Suggestion of abnormal flow in the left MCA branches. Recommend followup head and neck CTA (considering that MRA is unlikely to be successful with the patient's degree of motion). 6/17 LP: Normal    SIGNIFICANT EVENTS /  STUDIES:  6/19 CT head: Stable findings in the left cerebral hemisphere consistent with an infiltrative process on MRI. No acute intracranial abnormality.   LINES / TUBES:   CULTURES:   ANTIBIOTICS:   HISTORY OF PRESENT ILLNESS:   Pt is extremely somnolent and unable to provide history. Notes from recent hospitalization and Dr Gertha Calkin consultation note from this day reviewed in detail.   PAST MEDICAL HISTORY :  Past Medical History  Diagnosis Date  . Hypertension   . Neuropathy   . Peripheral vascular disease   . Coronary artery disease   . Anxiety   . Depression   . Anemia   . Type II diabetes mellitus   . High cholesterol   . History of blood transfusion     "one time; related to diabetes I guess"   . Arthritis     "hands"  . GERD (gastroesophageal reflux disease)     takes Zantac  . Complication of anesthesia     pt felt like she had a hard time waking up after surgery in Feb. 2015.  Marland Kitchen CHF (congestive heart failure)   . Seizures    Past Surgical History  Procedure Laterality Date  . Toe amputation Right     "took off a couple toes before the amputation"  . Below knee leg amputation Right Jan. 2008  . Carotid endarterectomy Right   . Cholecystectomy    . Ankle fusion Left 05/11/2013    TIBIOCALCANEAL FUSION   . Tonsillectomy    . Dilation and curettage of uterus    . Coronary artery bypass graft  12/01/2007    "CABG X4" (05/12/2013)  . Ankle fusion Left 05/12/2013    Procedure: LEFT TIBIOCALCANEAL FUSION;  Surgeon: Newt Minion, MD;  Location: [place];  Service: Orthopedics;  Laterality: Left;  Left Tibiocalcaneal Fusion  . Colonoscopy    . Irrigation and debridement abscess Left 06/17/2013    ANKLE           DR [name]  . I&d extremity Left 06/17/2013    Procedure: IRRIGATION AND DEBRIDEMENT EXTREMITY, PLACEMENT ANTIBIOTIC STIMULAN BEADS;  Surgeon: Newt Minion, MD;  Location: [place];  Service: Orthopedics;  Laterality: Left;  . Application of wound vac  06/17/2013     Procedure: APPLICATION OF WOUND VAC;  Surgeon: Newt Minion, MD;  Location: [place];  Service: Orthopedics;;  . Amputation Left 08/22/2013    Procedure: LEFT BELOW KNEE AMPUTATION;  Surgeon: Newt Minion, MD;  Location: [place];  Service: Orthopedics;  Laterality: Left;   Prior to Admission medications   Medication Sig Start Date End Date Taking? Authorizing Provider  amitriptyline (ELAVIL) 25 MG tablet Take 25 mg by mouth at bedtime as needed for sleep.    Yes Historical Provider, MD  aspirin 81 MG tablet Take 81 mg by mouth daily.     Yes Historical Provider, MD  atorvastatin (LIPITOR) 80 MG tablet Take 80 mg by mouth daily.     Yes Historical Provider, MD  clopidogrel (PLAVIX) 75 MG tablet Take 75 mg by mouth daily.    Yes Historical Provider, MD  hydrALAZINE (APRESOLINE) 50 MG tablet Take 1 tablet (50 mg total) by mouth every 8 (eight) hours. 09/01/13  Yes Thurnell Lose, MD  insulin NPH Human (HUMULIN N,NOVOLIN N) 100 UNIT/ML injection Inject 25-35 Units into the skin 2 (two) times daily before a meal. 35 units in the morning and 25 units in the evening   Yes Historical Provider, MD  isosorbide mononitrate (IMDUR) 30 MG 24 hr tablet Take 1 tablet (30 mg total) by mouth daily. 09/01/13  Yes Thurnell Lose, MD  levETIRAcetam (KEPPRA) 500 MG tablet Take 1 tablet (500 mg total) by mouth 2 (two) times daily. 09/01/13  Yes Thurnell Lose, MD  metFORMIN (GLUCOPHAGE) 500 MG tablet Take 1 tablet (500 mg total) by mouth 4 (four) times daily. 09/02/13  Yes Thurnell Lose, MD  metoprolol tartrate (LOPRESSOR) 50 MG tablet Take 0.5 tablets (25 mg total) by mouth 2 (two) times daily. 09/01/13  Yes Thurnell Lose, MD  Multiple Vitamins-Minerals (MULTIVITAMINS THER. W/MINERALS) TABS Take 1 tablet by mouth daily.     Yes Historical Provider, MD  oxyCODONE-acetaminophen (ROXICET) 5-325 MG per tablet Take 1 tablet by mouth every 4 (four) hours as needed for severe pain. 05/13/13  Yes Newt Minion, MD   quinapril (ACCUPRIL) 40 MG tablet Take 40 mg by mouth daily.    Yes Historical Provider, MD  ranitidine (ZANTAC) 150 MG tablet Take 150 mg by mouth 2 (two) times daily.    Yes Historical Provider, MD  sertraline (ZOLOFT) 100 MG tablet Take 100 mg by mouth 2 (two) times daily.    Yes Historical Provider, MD   Allergies  Allergen Reactions  . Tape Rash    Redness, rash, itchiness    FAMILY HISTORY:  Family History  Problem Relation Age of Onset  . Cancer Mother   . Heart disease Father   . Diabetes Father   . Hyperlipidemia Father   . Hypertension Father    SOCIAL HISTORY:  reports that she has never smoked. She has never used smokeless tobacco. She reports that she does not drink alcohol or use illicit drugs.  REVIEW  OF SYSTEMS:  N/A  SUBJECTIVE:   VITAL SIGNS: Temp:  [97.7 F (36.5 C)] 97.7 F (36.5 C) (06/19 1221) Pulse Rate:  [99-111] 100 (06/19 1715) Resp:  [16-26] 18 (06/19 1715) BP: (124-197)/(63-124) 139/67 mmHg (06/19 1715) SpO2:  [96 %-100 %] 98 % (06/19 1715) HEMODYNAMICS:   VENTILATOR SETTINGS:   INTAKE / OUTPUT: Intake/Output     06/18 0701 - 06/19 0700 06/19 0701 - 06/20 0700   Urine  200   Total Output   200   Net   -200          PHYSICAL EXAMINATION: General: Somnolent, protecting airway, no respiratory distress Neuro:  RASS -3, minimal withdrawal from pain, DTRs symmetric HEENT: PERRL, NCAT Cardiovascular:  RRR s M Lungs:  Clear anteriorly, midline sternotomy scar well healed Abdomen: moderately obese, soft, NT, +BS Ext: B BKA, L BKA wound without drainage or purulence  LABS:  CBC  Recent Labs Lab 08/30/13 0550 08/31/13 0440 09/02/13 1445  WBC 5.7 6.6 8.8  HGB 7.9* 8.8* 9.7*  HCT 26.4* 29.5* 31.8*  PLT 369 432* 429*   Coag's No results found for this basename: APTT, INR,  in the last 168 hours BMET  Recent Labs Lab 08/30/13 0550 08/31/13 0440 09/02/13 1445  NA 142 141 142  K 3.3* 3.6* 3.8  CL 103 101 100  CO2 25 27 27    BUN 10 10 17   CREATININE 1.32* 1.18* 1.25*  GLUCOSE 227* 169* 267*   Electrolytes  Recent Labs Lab 08/29/13 0111  08/30/13 0550 08/31/13 0440 09/02/13 1445  CALCIUM 8.8  < > 8.6 9.4 8.8  MG 1.2*  --  1.9  --   --   PHOS 3.7  --   --   --   --   < > = values in this interval not displayed. Sepsis Markers  Recent Labs Lab 08/29/13 0124  LATICACIDVEN 0.73   ABG No results found for this basename: PHART, PCO2ART, PO2ART,  in the last 168 hours Liver Enzymes  Recent Labs Lab 08/29/13 0111 08/29/13 0746 09/02/13 1445  AST 22 18 25   ALT 29 25 17   ALKPHOS 138* 125* 123*  BILITOT 0.6 0.4 0.5  ALBUMIN 2.3* 2.0* 2.7*   Cardiac Enzymes  Recent Labs Lab 08/29/13 0111 09/02/13 1445  TROPONINI <0.30 <0.30   Glucose  Recent Labs Lab 08/31/13 1147 08/31/13 1633 08/31/13 2215 09/01/13 0654 09/01/13 1201 09/02/13 1442  GLUCAP 188* 125* 171* 138* 189* 259*    Imaging Ct Head Wo Contrast  09/02/2013   CLINICAL DATA:  SEIZURES  EXAM: CT HEAD WITHOUT CONTRAST  TECHNIQUE: Contiguous axial images were obtained from the base of the skull through the vertex without intravenous contrast.  COMPARISON:  Head CT dated 08/29/2013, Brain MRI dated 08/30/2013  FINDINGS: The diffuse area of low-attenuation within the centrum semiovale and corona radiata regions on the left is stable. There is no evidence of mass effect. No evidence of acute hemorrhage. No extra-axial fluid collections. No acute territorial infarction. The ventricles and cisterns are patent.  Visualized paranasal sinuses are unremarkable. No acute cranial abnormalities.  IMPRESSION: Stable findings in the left cerebral hemisphere consistent with an infiltrative process on MRI. No acute intracranial abnormality.   Electronically Signed   By: Margaree Mackintosh M.D.   On: 09/02/2013 14:21   Dg Chest Port 1 View  09/02/2013   CLINICAL DATA:  lethargic  EXAM: PORTABLE CHEST - 1 VIEW  COMPARISON:  Portal chest radiograph  08/29/2013.  FINDINGS:  Low lung volumes. Cardiac silhouette stable. Stable median sternotomy and coronary artery bypass grafting changes. No focal infiltrates, regions of consolidation, or effusions. Degenerative changes within the shoulders. No acute osseous abnormalities.  IMPRESSION: Low lung volumes without evidence of focal or acute cardiopulmonary disease.   Electronically Signed   By: Margaree Mackintosh M.D.   On: 09/02/2013 13:43     CXR: NACPD  ASSESSMENT / PLAN: NEUROLOGIC A:   Brain mass or lesion Recurrent seizures Acute post-ictal encephalopathy P:   Neuro managing Keppra IV ordered Continuous EEG ordered Monitor closely in ICU Repeat MRI ordered CT abd ordered to look for possible primary cancer  Note: CT chest has recently been performed. A repeat was ordered as part of search for primary. I have cancelled this   PULMONARY A: At risk for intubation P:   Supp O2 Oximetry Capnography Monitor in ICU  CARDIOVASCULAR A:  H/O CAD, PVD, hypertension, hyperlipidemia, CHF (Echo 2009: LVEF 60%) Chronic beta blocker therapy P:  Monitor BP and rhythm Holding ASA, statin, metoprolol, isosorbide, accupril PRN metoprolol to maintain HR < 115/min PRN hydralazine to maintain SBP < 170 mmHg  RENAL A:   CKD P:   Maintenance IVFs until taking POs Monitor BMET intermittently Monitor I/Os Correct electrolytes as indicated  GASTROINTESTINAL A:   GERD, chronic H2RA therapy P:   SUP: IV famotidine NPO until cognition improves  HEMATOLOGIC A:   Mild anemia without acute blood loss P:  DVT px: SQ heparin Monitor CBC intermittently Transfuse per usual ICU guidelines  INFECTIOUS A:   Recent LLL absces and osteo - s/p amputation P:   Monitor off abx  Micro and abx as above  ENDOCRINE A:   DM II with apparent insulin resistance  P:   Lantus  SSI, moderate scale Holding metformin When taking POs, need to adjust long-acting insulin and change SSI to  ACHS  MUSCULOSKELETAL: A: Recent L BKA P: WOC to see for post op wound mgmt    Merton Border, MD ; [facility] service Mobile [phone].  After 5:30 PM or weekends, call [phone] Pulmonary and [place] Pager: [phone]  09/02/2013, 5:24 PM

## 2013-09-02 NOTE — ED Notes (Signed)
Admit dr into see pt 

## 2013-09-02 NOTE — ED Notes (Signed)
Pt not responsive and arms are flaccid  but left eye is twitching., family husband and son at bedside  States pt sis not bump her head during sz at home pt was in bed they stated . They did speak to p this amt before sz started  But she was confused. Family states that yesterday after bringing pt home from hospital she would have periods of lucidity but then be confused again

## 2013-09-02 NOTE — ED Notes (Signed)
In and out cath done Facilities manager as 2nd nurse

## 2013-09-02 NOTE — ED Notes (Signed)
Spoke with Dr. Armida Sans and would like EEG to stay attached and will wait for CT and MRI tomorrow.

## 2013-09-02 NOTE — ED Notes (Signed)
To ct

## 2013-09-02 NOTE — ED Notes (Addendum)
Per pt son pt had sz  X 2 this am per ems pt  Postictal upon arrival to er has IV 18 left ac, per son pt just left hospital yesterday  Pt had left BKA 2 weeks ago stump is still wrapped. Son states that they were to see ortho dr yesterday but she was d/c from hospital . Pt has old rt aka

## 2013-09-02 NOTE — ED Notes (Signed)
Neurologist at bedside. 

## 2013-09-02 NOTE — ED Notes (Signed)
Spoke with Darrell with EEG, call [phone] when ready to move.

## 2013-09-03 ENCOUNTER — Inpatient Hospital Stay (HOSPITAL_COMMUNITY): Payer: Medicare Other

## 2013-09-03 LAB — BASIC METABOLIC PANEL
BUN: 21 mg/dL (ref 6–23)
CHLORIDE: 104 meq/L (ref 96–112)
CO2: 26 mEq/L (ref 19–32)
Calcium: 8.5 mg/dL (ref 8.4–10.5)
Creatinine, Ser: 1.24 mg/dL — ABNORMAL HIGH (ref 0.50–1.10)
GFR calc Af Amer: 55 mL/min — ABNORMAL LOW (ref >90)
GFR calc non Af Amer: 48 mL/min — ABNORMAL LOW (ref >90)
GLUCOSE: 146 mg/dL — AB (ref 70–99)
Potassium: 3.4 mEq/L — ABNORMAL LOW (ref 3.7–5.3)
SODIUM: 142 meq/L (ref 137–147)

## 2013-09-03 LAB — CBC
HEMATOCRIT: 29.3 % — AB (ref 36.0–46.0)
HEMOGLOBIN: 8.8 g/dL — AB (ref 12.0–15.0)
MCH: 24.7 pg — ABNORMAL LOW (ref 26.0–34.0)
MCHC: 30 g/dL (ref 30.0–36.0)
MCV: 82.3 fL (ref 78.0–100.0)
Platelets: 383 10*3/uL (ref 150–400)
RBC: 3.56 MIL/uL — ABNORMAL LOW (ref 3.87–5.11)
RDW: 18.2 % — ABNORMAL HIGH (ref 11.5–15.5)
WBC: 7.4 10*3/uL (ref 4.0–10.5)

## 2013-09-03 LAB — GLUCOSE, CAPILLARY
GLUCOSE-CAPILLARY: 162 mg/dL — AB (ref 70–99)
GLUCOSE-CAPILLARY: 165 mg/dL — AB (ref 70–99)
Glucose-Capillary: 148 mg/dL — ABNORMAL HIGH (ref 70–99)
Glucose-Capillary: 149 mg/dL — ABNORMAL HIGH (ref 70–99)
Glucose-Capillary: 157 mg/dL — ABNORMAL HIGH (ref 70–99)
Glucose-Capillary: 159 mg/dL — ABNORMAL HIGH (ref 70–99)
Glucose-Capillary: 165 mg/dL — ABNORMAL HIGH (ref 70–99)

## 2013-09-03 MED ORDER — IOHEXOL 300 MG/ML  SOLN: 25.0000 mL | INTRAMUSCULAR | Status: AC

## 2013-09-03 MED ORDER — GADOBENATE DIMEGLUMINE 529 MG/ML IV SOLN
15.0000 mL | Freq: Once | INTRAVENOUS | Status: AC | PRN
  Administered 2013-09-03: 15 mL via INTRAVENOUS

## 2013-09-03 MED ORDER — POTASSIUM CHLORIDE 10 MEQ/100ML IV SOLN
10.0000 meq | INTRAVENOUS | Status: DC
  Administered 2013-09-03 (×2): 10 meq via INTRAVENOUS
  Filled 2013-09-03 (×2): qty 100

## 2013-09-03 NOTE — Plan of Care (Signed)
Problem: Phase I Progression Outcomes Goal: Seizure activity controlled Outcome: Progressing Pt has not experienced any seizure activity this shift.    Goal: IV access obtained Outcome: Completed/Met Date Met:  09/03/13 Pt has 2 peripheral IV accesses.

## 2013-09-03 NOTE — Progress Notes (Signed)
LTM EEG D/C'd per Dr Armida Sans

## 2013-09-03 NOTE — Progress Notes (Signed)
[place] Progress Note Patient Name: [name] DOB: May 02, 1955 MRN: [id number]  Date of Service  09/03/2013   HPI/Events of Note   Hypokalemia 3.4  eICU Interventions  replaced   Intervention Category Minor Interventions: Electrolytes abnormality - evaluation and management  Mauri Brooklyn, P 09/03/2013, 6:12 AM

## 2013-09-03 NOTE — Progress Notes (Signed)
PULMONARY / CRITICAL CARE MEDICINE   Name: [name] MRN: [id number] DOB: 15-May-1955    ADMISSION DATE:  09/02/2013  REFERRING MD :  EDP PRIMARY SERVICE: PCCM   BRIEF PATIENT DESCRIPTION:  51 F hospitalized by [facility] 6/15-6/18 with new onset seizures. Eval included head CT and MRI brain with abnormality in L frontal lobe And negative LP. Presented again to ED 6/19 with seizures which abated with lorazepam X 2 doses. Neurology evaluated in ED and asked PCCM to admit to ICU. Pt also s/p L BKA 6/08 for LLE abscess and osteomyelitis  STUDIES DURING LAST ADMISSION: 6/15 CT head: Low attenuation in the left corona radiata and centrum semiovale is suspicious for vasogenic edema. This could represent asymmetric chronic ischemic white matter disease however the amount of progression since 2009 would be unusual. Followup MRI recommended for further assessment, preferably with and without contrast  6/15 CT chest: No evidence of pulmonary embolus. Minimal bilateral dependent atelectasis noted; lungs otherwise clear. Diffuse coronary artery calcifications seen. Splenomegaly noted  6/15 MRI brain: Infiltrative mass in the left frontal cortex with probable associated vasogenic edema extending caudally. Considerations include primary brain tumor, metastatic disease, or intracranial infection, either bacterial or viral (PML). If the patient is more cooperative, post infusion imaging could be re-attempted, possibly providing more specificity. Alternately, tissue sampling could be performed.  6/16 MRI brain: Motion degraded exam despite repeated imaging attempts, image quality less than that obtained on yesterday's study. No convincing enhancement to correspond to the left frontal lobe lesion. Suggestion of abnormal flow in the left MCA branches. Recommend followup head and neck CTA (considering that MRA is unlikely to be successful with the patient's degree of motion).  6/17 LP: Normal  SIGNIFICANT EVENTS  / STUDIES:  6/19 CT head: Stable findings in the left cerebral hemisphere consistent with an infiltrative process on MRI. No acute intracranial abnormality.   LINES / TUBES:   CULTURES:   ANTIBIOTICS:   SUBJECTIVE:   VITAL SIGNS: Temp:  [97.5 F (36.4 C)-98.6 F (37 C)] 97.5 F (36.4 C) (06/20 0738) Pulse Rate:  [94-111] 107 (06/20 0800) Resp:  [14-26] 14 (06/20 0800) BP: (111-197)/(58-124) 187/83 mmHg (06/20 0800) SpO2:  [95 %-100 %] 99 % (06/20 0800) Weight:  [140 lb 14 oz (63.9 kg)] 140 lb 14 oz (63.9 kg) (06/19 1830) HEMODYNAMICS:   VENTILATOR SETTINGS:   INTAKE / OUTPUT: Intake/Output     06/19 0701 - 06/20 0700 06/20 0701 - 06/21 0700   I.V. (mL/kg) 975 (15.3) 75 (1.2)   IV Piggyback 157.5    Total Intake(mL/kg) 1132.5 (17.7) 75 (1.2)   Urine (mL/kg/hr) 800    Total Output 800     Net +332.5 +75         PHYSICAL EXAMINATION: General: Somnolent, protecting airway, no respiratory distress Neuro:  RASS -3, minimal withdrawal from pain, DTRs symmetric HEENT: PERRL, NCAT Cardiovascular:  RRR s M Lungs:  Clear anteriorly, midline sternotomy scar well healed Abdomen: moderately obese, soft, NT, +BS Ext: B BKA, L BKA wound without drainage or purulence  LABS:  CBC  Recent Labs Lab 08/31/13 0440 09/02/13 1445 09/03/13 0230  WBC 6.6 8.8 7.4  HGB 8.8* 9.7* 8.8*  HCT 29.5* 31.8* 29.3*  PLT 432* 429* 383   Coag's No results found for this basename: APTT, INR,  in the last 168 hours BMET  Recent Labs Lab 08/31/13 0440 09/02/13 1445 09/03/13 0230  NA 141 142 142  K 3.6* 3.8 3.4*  CL 101  100 104  CO2 27 27 26   BUN 10 17 21   CREATININE 1.18* 1.25* 1.24*  GLUCOSE 169* 267* 146*   Electrolytes  Recent Labs Lab 08/29/13 0111  08/30/13 0550 08/31/13 0440 09/02/13 1445 09/03/13 0230  CALCIUM 8.8  < > 8.6 9.4 8.8 8.5  MG 1.2*  --  1.9  --   --   --   PHOS 3.7  --   --   --   --   --   < > = values in this interval not displayed. Sepsis  Markers  Recent Labs Lab 08/29/13 0124  LATICACIDVEN 0.73   ABG No results found for this basename: PHART, PCO2ART, PO2ART,  in the last 168 hours Liver Enzymes  Recent Labs Lab 08/29/13 0111 08/29/13 0746 09/02/13 1445  AST 22 18 25   ALT 29 25 17   ALKPHOS 138* 125* 123*  BILITOT 0.6 0.4 0.5  ALBUMIN 2.3* 2.0* 2.7*   Cardiac Enzymes  Recent Labs Lab 08/29/13 0111 09/02/13 1445  TROPONINI <0.30 <0.30   Glucose  Recent Labs Lab 09/01/13 1201 09/02/13 1442 09/02/13 1952 09/03/13 0004 09/03/13 0423 09/03/13 0736  GLUCAP 189* 259* 217* 165* 157* 149*    Imaging Ct Head Wo Contrast  09/02/2013   CLINICAL DATA:  SEIZURES  EXAM: CT HEAD WITHOUT CONTRAST  TECHNIQUE: Contiguous axial images were obtained from the base of the skull through the vertex without intravenous contrast.  COMPARISON:  Head CT dated 08/29/2013, Brain MRI dated 08/30/2013  FINDINGS: The diffuse area of low-attenuation within the centrum semiovale and corona radiata regions on the left is stable. There is no evidence of mass effect. No evidence of acute hemorrhage. No extra-axial fluid collections. No acute territorial infarction. The ventricles and cisterns are patent.  Visualized paranasal sinuses are unremarkable. No acute cranial abnormalities.  IMPRESSION: Stable findings in the left cerebral hemisphere consistent with an infiltrative process on MRI. No acute intracranial abnormality.   Electronically Signed   By: Margaree Mackintosh M.D.   On: 09/02/2013 14:21   Dg Chest Port 1 View  09/02/2013   CLINICAL DATA:  lethargic  EXAM: PORTABLE CHEST - 1 VIEW  COMPARISON:  Portal chest radiograph 08/29/2013.  FINDINGS: Low lung volumes. Cardiac silhouette stable. Stable median sternotomy and coronary artery bypass grafting changes. No focal infiltrates, regions of consolidation, or effusions. Degenerative changes within the shoulders. No acute osseous abnormalities.  IMPRESSION: Low lung volumes without evidence  of focal or acute cardiopulmonary disease.   Electronically Signed   By: Margaree Mackintosh M.D.   On: 09/02/2013 13:43   CXR: NACPD  ASSESSMENT / PLAN: NEUROLOGIC A:   Brain mass or lesion Recurrent seizures Acute post-ictal encephalopathy P:   - Neuro managing - MRI of the brain today. - Keppra IV ordered - Continuous EEG ordered - will defer to neuro - Monitor closely in ICU - CT abd ordered to look for possible primary cancer  Note: CT chest has recently been performed. A repeat was ordered as part of search for primary. I have cancelled this  PULMONARY A: At risk for intubation P:   - Supp O2. - Oximetry. - Capnography. - Monitor in ICU for ?airway protection.  CARDIOVASCULAR A:  H/O CAD, PVD, hypertension, hyperlipidemia, CHF (Echo 2009: LVEF 60%) Chronic beta blocker therapy P:  - Monitor BP and rhythm - Holding ASA, statin, metoprolol, isosorbide, accupril - PRN metoprolol to maintain HR < 115/min - PRN hydralazine to maintain SBP < 170 mmHg  RENAL A:  CKD P:   - Maintenance IVFs until taking POs - Monitor BMET intermittently - Monitor I/Os - Correct electrolytes as indicated  GASTROINTESTINAL A:   GERD, chronic H2RA therapy P:   - SUP: IV famotidine - NPO until cognition improves, if not improved by AM will place NGT and begin TF.  HEMATOLOGIC A:   Mild anemia without acute blood loss P:  - DVT px: SQ heparin. - Monitor CBC intermittently. - Transfuse per usual ICU guidelines.  INFECTIOUS A:   Recent LLL absces and osteo - s/p amputation P:   - Monitor off abx  - Micro and abx as above  ENDOCRINE A:   DM II with apparent insulin resistance  P:   - Lantus  - SSI, moderate scale - Holding metformin - When taking POs, need to adjust long-acting insulin and change SSI to ACHS  MUSCULOSKELETAL: A: Recent L BKA P: - WOC to see for post op wound mgmt  CT of abdomen and pelvis and brain MRI today.  Monitor for airway protection, if  decompensates then will intubate as full code for now.  EEG per neuro.    CC time 35 min.  Rush Farmer, M.D. [facility] Pulmonary/Critical Care Medicine. Pager: [phone]. After hours pager: [phone].  09/03/2013, 8:53 AM

## 2013-09-03 NOTE — ED Provider Notes (Signed)
Medical screening examination/treatment/procedure(s) were conducted as a shared visit with non-physician practitioner(s) and myself.  I personally evaluated the patient during the encounter.   EKG Interpretation   Date/Time:  Friday September 02 2013 12:15:07 EDT Ventricular Rate:  110 PR Interval:  158 QRS Duration: 104 QT Interval:  352 QTC Calculation: 476 R Axis:   47 Text Interpretation:  Sinus tachycardia Right atrial enlargement LVH with  secondary repolarization abnormality Anterior Q waves, possibly due to LVH  No significant change since last tracing Confirmed by Maryan Rued  MD,  Loree Fee ([id number]) on 09/02/2013 12:22:38 PM      Pt with hx of recent brain mass found that has not been further evaluated and neg LP with sz at home.  Upon arrival here pt was in subclinical status with eye deviation,blinking and clicking noises in throat.  Pt required 4mg  of ativan and IV keppra load of 1g to break.  Neurology consulted and EEG started.  Pt maintained VS and airway.  Admitted to neuroICU.  CRITICAL CARE Performed by: Blanchie Dessert Total critical care time: 30 Critical care time was exclusive of separately billable procedures and treating other patients. Critical care was necessary to treat or prevent imminent or life-threatening deterioration. Critical care was time spent personally by me on the following activities: development of treatment plan with patient and/or surrogate as well as nursing, discussions with consultants, evaluation of patient's response to treatment, examination of patient, obtaining history from patient or surrogate, ordering and performing treatments and interventions, ordering and review of laboratory studies, ordering and review of radiographic studies, pulse oximetry and re-evaluation of patient's condition.   Blanchie Dessert, MD 09/03/13 1257

## 2013-09-03 NOTE — Procedures (Signed)
EEG report.  Start date: 09/02/13 at 3:23:27 End date: 09/03/13 at 9:16:40 Brief clinical history: 58 y/o with structural GTC seizures in the context of a left frontal mass of unclear etiology ( primary CNS tumor vs brain metastasis, unlikely infection), brought in with GTC SE that stopped after IV ativan 4 mg and 1 gram loading dose IV keppra. Concern about NCSE.  Technique: this is a 17 channel routine long term monitoring EEG with video performed at the bedside with bipolar  montages arranged in accordance to the international 10/20 system of electrode placement. One channel was dedicated to EKG recording.  Recording time: 18 hours  .  Description: as the recording begins and throughout the entire study, there is diffuse and continuous low to medium amplitude theta activity that is intermixed with normal sleep architecture. There is not evidence of electrographic seizures or concerning evolving slowing. No epileptiform discharges noted. Stage 2 sleep showed symmetric and synchronous sleep spindles without intermixed epileptiform discharges.  Intermittent photic stimulation did induce a normal driving response.  No focal or generalized epileptiform discharges noted.   EKG showed sinus rhythm.  Impression: this is a long term EEG with video performed for 18 hours that demonstrated evidence of diffuse and continuous theta slowing that in this particular clinical context is most likely post ictal or medication related. No evidence of electrographic seizures .   Clinical correlation is advised.  Dorian Pod, MD

## 2013-09-03 NOTE — Progress Notes (Signed)
NEURO HOSPITALIST PROGRESS NOTE   SUBJECTIVE:                                                                                                                        No further seizures reported.Sleepy but easy to arouse. c-EEG showed no electrographic seizures. On keppra 750 mg BID. MRI brain pending. CT chest/abd/pelvis pending. Except for mild hypokalemia, serologies are unimpressive and UA without infection.  OBJECTIVE:                                                                                                                           Vital signs in last 24 hours: Temp:  [97.7 F (36.5 C)-98.6 F (37 C)] 98.4 F (36.9 C) (06/20 0445) Pulse Rate:  [94-111] 105 (06/20 0200) Resp:  [14-26] 16 (06/20 0200) BP: (111-197)/(58-124) 158/77 mmHg (06/20 0200) SpO2:  [95 %-100 %] 99 % (06/20 0200) Weight:  [63.9 kg (140 lb 14 oz)] 63.9 kg (140 lb 14 oz) (06/19 1830)  Intake/Output from previous day: 06/19 0701 - 06/20 0700 In: 757.5 [I.V.:600; IV Piggyback:157.5] Out: 800 [Urine:800] Intake/Output this shift:   Nutritional status: NPO  Past Medical History  Diagnosis Date  . Hypertension   . Neuropathy   . Peripheral vascular disease   . Coronary artery disease   . Anxiety   . Depression   . Anemia   . Type II diabetes mellitus   . High cholesterol   . History of blood transfusion     "one time; related to diabetes I guess"   . Arthritis     "hands"  . GERD (gastroesophageal reflux disease)     takes Zantac  . Complication of anesthesia     pt felt like she had a hard time waking up after surgery in Feb. 2015.  Marland Kitchen CHF (congestive heart failure)   . Seizures     Neurologic Exam:  Mental Status: sleepy but can be easily arouse.  Cranial Nerves: Pupils 4 mm bilaterally, reactive. No gaze preference. Doesn't blink to threat. Face appears to be symmetric. Tongue midline.  Motor:  Moves all limbs spontaneously Sensory: reacts to  noxious stimuli Deep Tendon Reflexes:  1+ all over  Plantars:  S/p BKA  Cerebellar:  Unable to test  Gait:  Unable to test   Lab Results: Lab Results  Component Value Date/Time   CHOL 113 08/30/2013  5:50 AM   Lipid Panel No results found for this basename: CHOL, TRIG, HDL, CHOLHDL, VLDL, LDLCALC,  in the last 72 hours  Studies/Results: Ct Head Wo Contrast  09/02/2013   CLINICAL DATA:  SEIZURES  EXAM: CT HEAD WITHOUT CONTRAST  TECHNIQUE: Contiguous axial images were obtained from the base of the skull through the vertex without intravenous contrast.  COMPARISON:  Head CT dated 08/29/2013, Brain MRI dated 08/30/2013  FINDINGS: The diffuse area of low-attenuation within the centrum semiovale and corona radiata regions on the left is stable. There is no evidence of mass effect. No evidence of acute hemorrhage. No extra-axial fluid collections. No acute territorial infarction. The ventricles and cisterns are patent.  Visualized paranasal sinuses are unremarkable. No acute cranial abnormalities.  IMPRESSION: Stable findings in the left cerebral hemisphere consistent with an infiltrative process on MRI. No acute intracranial abnormality.   Electronically Signed   By: Margaree Mackintosh M.D.   On: 09/02/2013 14:21   Dg Chest Port 1 View  09/02/2013   CLINICAL DATA:  lethargic  EXAM: PORTABLE CHEST - 1 VIEW  COMPARISON:  Portal chest radiograph 08/29/2013.  FINDINGS: Low lung volumes. Cardiac silhouette stable. Stable median sternotomy and coronary artery bypass grafting changes. No focal infiltrates, regions of consolidation, or effusions. Degenerative changes within the shoulders. No acute osseous abnormalities.  IMPRESSION: Low lung volumes without evidence of focal or acute cardiopulmonary disease.   Electronically Signed   By: Margaree Mackintosh M.D.   On: 09/02/2013 13:43    MEDICATIONS                                                                                                                        I have reviewed the patient's current medications.  ASSESSMENT/PLAN:                                                                                                           58 y/o with structural GTC seizures in the context of a left frontal mass of unclear etiology ( primary CNS tumor vs brain metastasis, unlikely infection), brought in with GTC SE that had resolved. No electrographic seizures on c-EEG. Will stop LTM. MRI brain today. Continue keppra. From a neuro standpoint she an go out of the unit today. Will follow up.  Dorian Pod, MD [facility] [phone]  09/03/2013, 7:24 AM

## 2013-09-03 NOTE — Progress Notes (Signed)
Utilization review complete 

## 2013-09-04 ENCOUNTER — Inpatient Hospital Stay (HOSPITAL_COMMUNITY): Payer: Medicare Other

## 2013-09-04 DIAGNOSIS — I10 Essential (primary) hypertension: Secondary | ICD-10-CM

## 2013-09-04 LAB — CULTURE, BLOOD (ROUTINE X 2)
CULTURE: NO GROWTH
Culture: NO GROWTH

## 2013-09-04 LAB — BASIC METABOLIC PANEL
BUN: 15 mg/dL (ref 6–23)
CHLORIDE: 105 meq/L (ref 96–112)
CO2: 24 meq/L (ref 19–32)
Calcium: 9.1 mg/dL (ref 8.4–10.5)
Creatinine, Ser: 1.1 mg/dL (ref 0.50–1.10)
GFR calc Af Amer: 64 mL/min — ABNORMAL LOW (ref >90)
GFR, EST NON AFRICAN AMERICAN: 55 mL/min — AB (ref >90)
Glucose, Bld: 149 mg/dL — ABNORMAL HIGH (ref 70–99)
POTASSIUM: 3.3 meq/L — AB (ref 3.7–5.3)
SODIUM: 145 meq/L (ref 137–147)

## 2013-09-04 LAB — GLUCOSE, CAPILLARY
Glucose-Capillary: 138 mg/dL — ABNORMAL HIGH (ref 70–99)
Glucose-Capillary: 150 mg/dL — ABNORMAL HIGH (ref 70–99)
Glucose-Capillary: 155 mg/dL — ABNORMAL HIGH (ref 70–99)
Glucose-Capillary: 166 mg/dL — ABNORMAL HIGH (ref 70–99)
Glucose-Capillary: 169 mg/dL — ABNORMAL HIGH (ref 70–99)
Glucose-Capillary: 192 mg/dL — ABNORMAL HIGH (ref 70–99)

## 2013-09-04 LAB — CBC
HCT: 29 % — ABNORMAL LOW (ref 36.0–46.0)
HEMOGLOBIN: 8.7 g/dL — AB (ref 12.0–15.0)
MCH: 24.2 pg — ABNORMAL LOW (ref 26.0–34.0)
MCHC: 30 g/dL (ref 30.0–36.0)
MCV: 80.8 fL (ref 78.0–100.0)
Platelets: 420 10*3/uL — ABNORMAL HIGH (ref 150–400)
RBC: 3.59 MIL/uL — ABNORMAL LOW (ref 3.87–5.11)
RDW: 18.3 % — ABNORMAL HIGH (ref 11.5–15.5)
WBC: 7.4 10*3/uL (ref 4.0–10.5)

## 2013-09-04 LAB — CSF CULTURE: CULTURE: NO GROWTH

## 2013-09-04 LAB — MAGNESIUM: Magnesium: 1.4 mg/dL — ABNORMAL LOW (ref 1.5–2.5)

## 2013-09-04 LAB — PHOSPHORUS: PHOSPHORUS: 2.3 mg/dL (ref 2.3–4.6)

## 2013-09-04 LAB — CSF CULTURE W GRAM STAIN

## 2013-09-04 MED ORDER — LEVETIRACETAM 750 MG PO TABS
750.0000 mg | ORAL_TABLET | Freq: Two times a day (BID) | ORAL | Status: DC
  Administered 2013-09-04 – 2013-09-05 (×2): 750 mg via ORAL
  Filled 2013-09-04 (×3): qty 1

## 2013-09-04 MED ORDER — IOHEXOL 300 MG/ML  SOLN
100.0000 mL | Freq: Once | INTRAMUSCULAR | Status: AC | PRN
  Administered 2013-09-04: 100 mL via INTRAVENOUS

## 2013-09-04 MED ORDER — FAMOTIDINE 20 MG PO TABS
20.0000 mg | ORAL_TABLET | Freq: Every day | ORAL | Status: DC
  Administered 2013-09-04: 20 mg via ORAL
  Filled 2013-09-04 (×2): qty 1

## 2013-09-04 MED ORDER — IOHEXOL 300 MG/ML  SOLN
25.0000 mL | INTRAMUSCULAR | Status: AC
  Administered 2013-09-04 (×2): 25 mL via ORAL

## 2013-09-04 MED ORDER — POTASSIUM CHLORIDE CRYS ER 20 MEQ PO TBCR
40.0000 meq | EXTENDED_RELEASE_TABLET | Freq: Three times a day (TID) | ORAL | Status: AC
  Administered 2013-09-04 (×2): 40 meq via ORAL
  Filled 2013-09-04 (×2): qty 2

## 2013-09-04 NOTE — Progress Notes (Addendum)
Patient trasfered from [facility] to [phone] via bed; alert and oriented x 4; no complaints of pain; IV saline locked in LFA and RFA; skin  - incision on left BKA - clean, dry, staples in place; redness to buttock - blanchable . Orient patient to room and unit; instructed how to use the call bell and  fall risk precautions. Will continue to monitor the patient.

## 2013-09-04 NOTE — Progress Notes (Addendum)
NEURO HOSPITALIST PROGRESS NOTE   SUBJECTIVE:                                                                                                                        Doing better this morning. Offers no neuro complains. She is speaking coherently and engages in conversation. No further seizures noted. MRI brain done yesterday was significantly degraded by motion, especially on the postcontrast images, but there is not overall changes in left frontal mass and vasogenic edema. On keppra 750 mg BID.  OBJECTIVE:                                                                                                                           Vital signs in last 24 hours: Temp:  [97.3 F (36.3 C)-98.3 F (36.8 C)] 98.1 F (36.7 C) (06/21 0738) Pulse Rate:  [104-117] 112 (06/21 0800) Resp:  [11-22] 12 (06/21 0800) BP: (143-198)/(61-117) 168/73 mmHg (06/21 0800) SpO2:  [95 %-100 %] 97 % (06/21 0800) Weight:  [62.4 kg (137 lb 9.1 oz)] 62.4 kg (137 lb 9.1 oz) (06/21 0400)  Intake/Output from previous day: 06/20 0701 - 06/21 0700 In: 2120 [I.V.:1705; IV Piggyback:415] Out: 925 [Urine:925] Intake/Output this shift: Total I/O In: 75 [I.V.:75] Out: -  Nutritional status: NPO  Past Medical History  Diagnosis Date  . Hypertension   . Neuropathy   . Peripheral vascular disease   . Coronary artery disease   . Anxiety   . Depression   . Anemia   . Type II diabetes mellitus   . High cholesterol   . History of blood transfusion     "one time; related to diabetes I guess"   . Arthritis     "hands"  . GERD (gastroesophageal reflux disease)     takes Zantac  . Complication of anesthesia     pt felt like she had a hard time waking up after surgery in Feb. 2015.  Marland Kitchen CHF (congestive heart failure)   . Seizures     Neurologic Exam:    Lab Results: Lab Results  Component Value Date/Time   CHOL 113 08/30/2013  5:50 AM   Lipid Panel No results found for  this basename: CHOL, TRIG, HDL, CHOLHDL, VLDL,  [place],  in the last 72 hours  Studies/Results: Ct Head Wo Contrast  09/02/2013   CLINICAL DATA:  SEIZURES  EXAM: CT HEAD WITHOUT CONTRAST  TECHNIQUE: Contiguous axial images were obtained from the base of the skull through the vertex without intravenous contrast.  COMPARISON:  Head CT dated 08/29/2013, Brain MRI dated 08/30/2013  FINDINGS: The diffuse area of low-attenuation within the centrum semiovale and corona radiata regions on the left is stable. There is no evidence of mass effect. No evidence of acute hemorrhage. No extra-axial fluid collections. No acute territorial infarction. The ventricles and cisterns are patent.  Visualized paranasal sinuses are unremarkable. No acute cranial abnormalities.  IMPRESSION: Stable findings in the left cerebral hemisphere consistent with an infiltrative process on MRI. No acute intracranial abnormality.   Electronically Signed   By: Margaree Mackintosh M.D.   On: 09/02/2013 14:21   Mr Jeri Cos [id number] Contrast  09/03/2013   CLINICAL DATA:  Status epilepticus.  Evaluate lesion  EXAM: MRI HEAD WITHOUT AND WITH CONTRAST  TECHNIQUE: Multiplanar, multiecho pulse sequences of the brain and surrounding structures were obtained without and with intravenous contrast.  CONTRAST:  45mL MULTIHANCE GADOBENATE DIMEGLUMINE 529 MG/ML IV SOLN  COMPARISON:  MRI 08/30/2013, 08/29/2013  FINDINGS: Image quality degraded by motion. Initially the patient held still but quickly began moving extensively. Postcontrast images were obtained in the axial plane which are markedly degraded by motion. The study was terminated at this point and sagittal and coronal imaging postcontrast was not possible due to motion and lack of cooperation.  Abnormal signal in the left frontal parietal lobes again noted and unchanged. This is predominantly in the white matter in the centrum semiovale extending into the subcortical white matter. Questionable area of thickening in  the left frontal cortex is unchanged. No significant mass-effect or effacement of the sulci in this area. This area does not show restricted diffusion. No obvious enhancement is seen on the prior study or on today's study although postcontrast images are suboptimal.  Left internal carotid artery is chronically occluded as noted on the prior study and 2009.  Mild atrophy. Negative for hydrocephalus. No acute infarct. No evidence of hemorrhage. No shift of the midline structures.  IMPRESSION: This study is significantly degraded by motion, especially on the postcontrast images. Allowing for this no change from prior studies.  Left frontal parietal abnormality, primarily in the deep white matter and subcortical white matter. Differential includes chronic ischemia especially in light of the occluded left internal carotid artery. PML is a possibility. Tumor and infection are also considerations.   Electronically Signed   By: Franchot Gallo M.D.   On: 09/03/2013 17:33   Dg Chest Port 1 View  09/02/2013   CLINICAL DATA:  lethargic  EXAM: PORTABLE CHEST - 1 VIEW  COMPARISON:  Portal chest radiograph 08/29/2013.  FINDINGS: Low lung volumes. Cardiac silhouette stable. Stable median sternotomy and coronary artery bypass grafting changes. No focal infiltrates, regions of consolidation, or effusions. Degenerative changes within the shoulders. No acute osseous abnormalities.  IMPRESSION: Low lung volumes without evidence of focal or acute cardiopulmonary disease.   Electronically Signed   By: Margaree Mackintosh M.D.   On: 09/02/2013 13:43    MEDICATIONS  Scheduled: . antiseptic oral rinse  15 mL Mouth Rinse BID  . famotidine (PEPCID) IV  20 mg Intravenous Q24H  . heparin subcutaneous  5,000 Units Subcutaneous 3 times per day  . insulin aspart  0-15 Units Subcutaneous 6 times per day  . insulin glargine   20 Units Subcutaneous QHS  . levETIRAcetam  750 mg Intravenous Q12H    ASSESSMENT/PLAN:                                                                                                           58 y/o with structural GTC seizures in the context of a left frontal mass of unclear etiology ( primary CNS tumor vs brain metastasis, unlikely infection), brought in with GTC SE that had resolved. MRI brain yesterday was compromised by significant motion, but overall no changes. Will check if CSF available to check JC virus. CT chest, abdomen, and pelvis when feasible. Out of the unit today. Will follow up.  Dorian Pod, MD [facility] [phone]  09/04/2013, 8:19 AM

## 2013-09-04 NOTE — Progress Notes (Signed)
Pt and pt's son refused continuous pulse ox. Pt's son states that she doesn't like the thing on her finger. Will continue to monitor pt. Ranelle Oyster, RN

## 2013-09-04 NOTE — Plan of Care (Signed)
Problem: Phase I Progression Outcomes Goal: Pain controlled with appropriate interventions Outcome: Completed/Met Date Met:  09/04/13 Pt remains pain free Goal: Hemodynamically stable Outcome: Progressing 6/21 - requires occasional metoprolol and hydralizine to reach goal.

## 2013-09-04 NOTE — Consult Note (Signed)
WOC wound consult note Reason for Consult: Assessment and suggestions for care of left LE stump incision Wound type: SUrgical Pressure Ulcer POA: No Measurement:22cm in length closely approximated incision with 27 staples intact and without evidence of wound dehiscence.  No erythema, slight warmth.  Ecchymosis (fading) at medial aspect, distal to incision. Wound [id number] Drainage (amount, consistency, odor) None Periwound:As noted above Dressing procedure/placement/frequency: I have implemented conservative care orders such as daily cleansing of incision line with antimicrobial soap and water and will recommend we leave this incision open to air until orthopedics (Dr. Sharol Given) indicates that it is time to discontinue stapes and place steri-strips. [place] nursing team will not follow, but will remain available to this patient, the nursing and medical team.  Please re-consult if needed. Thanks, Maudie Flakes, MSN, RN, [place], [place], [place] [phone])

## 2013-09-04 NOTE — Progress Notes (Signed)
PULMONARY / CRITICAL CARE MEDICINE   Name: [name] MRN: [id number] DOB: 05/15/1955    ADMISSION DATE:  09/02/2013  REFERRING MD :  EDP PRIMARY SERVICE: PCCM   BRIEF PATIENT DESCRIPTION:  89 F hospitalized by [facility] 6/15-6/18 with new onset seizures. Eval included head CT and MRI brain with abnormality in L frontal lobe And negative LP. Presented again to ED 6/19 with seizures which abated with lorazepam X 2 doses. Neurology evaluated in ED and asked PCCM to admit to ICU. Pt also s/p L BKA 6/08 for LLE abscess and osteomyelitis  STUDIES DURING LAST ADMISSION: 6/15 CT head: Low attenuation in the left corona radiata and centrum semiovale is suspicious for vasogenic edema. This could represent asymmetric chronic ischemic white matter disease however the amount of progression since 2009 would be unusual. Followup MRI recommended for further assessment, preferably with and without contrast  6/15 CT chest: No evidence of pulmonary embolus. Minimal bilateral dependent atelectasis noted; lungs otherwise clear. Diffuse coronary artery calcifications seen. Splenomegaly noted  6/15 MRI brain: Infiltrative mass in the left frontal cortex with probable associated vasogenic edema extending caudally. Considerations include primary brain tumor, metastatic disease, or intracranial infection, either bacterial or viral (PML). If the patient is more cooperative, post infusion imaging could be re-attempted, possibly providing more specificity. Alternately, tissue sampling could be performed.  6/16 MRI brain: Motion degraded exam despite repeated imaging attempts, image quality less than that obtained on yesterday's study. No convincing enhancement to correspond to the left frontal lobe lesion. Suggestion of abnormal flow in the left MCA branches. Recommend followup head and neck CTA (considering that MRA is unlikely to be successful with the patient's degree of motion).  6/17 LP: Normal  SIGNIFICANT EVENTS  / STUDIES:  6/19 CT head: Stable findings in the left cerebral hemisphere consistent with an infiltrative process on MRI. No acute intracranial abnormality. 6/21 much more alert and interactive  LINES / TUBES: PIV  CULTURES: None  ANTIBIOTICS: None  SUBJECTIVE: Awake and interactive, aphasic.   VITAL SIGNS: Temp:  [97.3 F (36.3 C)-98.3 F (36.8 C)] 98.1 F (36.7 C) (06/21 0738) Pulse Rate:  [105-117] 112 (06/21 0800) Resp:  [11-22] 12 (06/21 0800) BP: (143-198)/(61-117) 168/73 mmHg (06/21 0800) SpO2:  [95 %-100 %] 97 % (06/21 0800) Weight:  [137 lb 9.1 oz (62.4 kg)] 137 lb 9.1 oz (62.4 kg) (06/21 0400) HEMODYNAMICS:   VENTILATOR SETTINGS:   INTAKE / OUTPUT: Intake/Output     06/20 0701 - 06/21 0700 06/21 0701 - 06/22 0700   I.V. (mL/kg) 1705 (27.3) 75 (1.2)   IV Piggyback 415    Total Intake(mL/kg) 2120 (34) 75 (1.2)   Urine (mL/kg/hr) 925 (0.6)    Total Output 925     Net +1195 +75        Stool Occurrence 3 x     PHYSICAL EXAMINATION: General: Awake and interactive, following commands. Neuro: Awake and moving all ext to commands but aphasic. HEENT: PERRL, NCAT Cardiovascular:  RRR s M Lungs:  Clear anteriorly, midline sternotomy scar well healed Abdomen: moderately obese, soft, NT, +BS Ext: B BKA, L BKA wound without drainage or purulence  LABS:  CBC  Recent Labs Lab 09/02/13 1445 09/03/13 0230 09/04/13 0209  WBC 8.8 7.4 7.4  HGB 9.7* 8.8* 8.7*  HCT 31.8* 29.3* 29.0*  PLT 429* 383 420*   Coag's No results found for this basename: APTT, INR,  in the last 168 hours BMET  Recent Labs Lab 09/02/13 1445 09/03/13  0230 09/04/13 0209  NA 142 142 145  K 3.8 3.4* 3.3*  CL 100 104 105  CO2 27 26 24   BUN 17 21 15   CREATININE 1.25* 1.24* 1.10  GLUCOSE 267* 146* 149*   Electrolytes  Recent Labs Lab 08/29/13 0111  08/30/13 0550  09/02/13 1445 09/03/13 0230 09/04/13 0209  CALCIUM 8.8  < > 8.6  < > 8.8 8.5 9.1  MG 1.2*  --  1.9  --   --   --   1.4*  PHOS 3.7  --   --   --   --   --  2.3  < > = values in this interval not displayed. Sepsis Markers  Recent Labs Lab 08/29/13 0124  LATICACIDVEN 0.73   ABG No results found for this basename: PHART, PCO2ART, PO2ART,  in the last 168 hours Liver Enzymes  Recent Labs Lab 08/29/13 0111 08/29/13 0746 09/02/13 1445  AST 22 18 25   ALT 29 25 17   ALKPHOS 138* 125* 123*  BILITOT 0.6 0.4 0.5  ALBUMIN 2.3* 2.0* 2.7*   Cardiac Enzymes  Recent Labs Lab 08/29/13 0111 09/02/13 1445  TROPONINI <0.30 <0.30   Glucose  Recent Labs Lab 09/03/13 1537 09/03/13 1929 09/03/13 2242 09/04/13 0010 09/04/13 0403 09/04/13 0735  GLUCAP 162* 159* 165* 150* 155* 166*    Imaging Ct Head Wo Contrast  09/02/2013   CLINICAL DATA:  SEIZURES  EXAM: CT HEAD WITHOUT CONTRAST  TECHNIQUE: Contiguous axial images were obtained from the base of the skull through the vertex without intravenous contrast.  COMPARISON:  Head CT dated 08/29/2013, Brain MRI dated 08/30/2013  FINDINGS: The diffuse area of low-attenuation within the centrum semiovale and corona radiata regions on the left is stable. There is no evidence of mass effect. No evidence of acute hemorrhage. No extra-axial fluid collections. No acute territorial infarction. The ventricles and cisterns are patent.  Visualized paranasal sinuses are unremarkable. No acute cranial abnormalities.  IMPRESSION: Stable findings in the left cerebral hemisphere consistent with an infiltrative process on MRI. No acute intracranial abnormality.   Electronically Signed   By: Margaree Mackintosh M.D.   On: 09/02/2013 14:21   Mr Jeri Cos [id number] Contrast  09/03/2013   CLINICAL DATA:  Status epilepticus.  Evaluate lesion  EXAM: MRI HEAD WITHOUT AND WITH CONTRAST  TECHNIQUE: Multiplanar, multiecho pulse sequences of the brain and surrounding structures were obtained without and with intravenous contrast.  CONTRAST:  60mL MULTIHANCE GADOBENATE DIMEGLUMINE 529 MG/ML IV SOLN   COMPARISON:  MRI 08/30/2013, 08/29/2013  FINDINGS: Image quality degraded by motion. Initially the patient held still but quickly began moving extensively. Postcontrast images were obtained in the axial plane which are markedly degraded by motion. The study was terminated at this point and sagittal and coronal imaging postcontrast was not possible due to motion and lack of cooperation.  Abnormal signal in the left frontal parietal lobes again noted and unchanged. This is predominantly in the white matter in the centrum semiovale extending into the subcortical white matter. Questionable area of thickening in the left frontal cortex is unchanged. No significant mass-effect or effacement of the sulci in this area. This area does not show restricted diffusion. No obvious enhancement is seen on the prior study or on today's study although postcontrast images are suboptimal.  Left internal carotid artery is chronically occluded as noted on the prior study and 2009.  Mild atrophy. Negative for hydrocephalus. No acute infarct. No evidence of hemorrhage. No shift of the midline structures.  IMPRESSION: This study is significantly degraded by motion, especially on the postcontrast images. Allowing for this no change from prior studies.  Left frontal parietal abnormality, primarily in the deep white matter and subcortical white matter. Differential includes chronic ischemia especially in light of the occluded left internal carotid artery. PML is a possibility. Tumor and infection are also considerations.   Electronically Signed   By: Franchot Gallo M.D.   On: 09/03/2013 17:33   Dg Chest Port 1 View  09/04/2013   CLINICAL DATA:  Respiratory failure  EXAM: PORTABLE CHEST - 1 VIEW  COMPARISON:  Portable chest radiograph dated 09/02/2013  FINDINGS: Cardiac silhouette stable. Stable median sternotomy changes. Lungs are clear. Degenerative changes within the shoulders. No acute osseous abnormalities.  IMPRESSION: No acute  cardiopulmonary disease.   Electronically Signed   By: Margaree Mackintosh M.D.   On: 09/04/2013 08:51   Dg Chest Port 1 View  09/02/2013   CLINICAL DATA:  lethargic  EXAM: PORTABLE CHEST - 1 VIEW  COMPARISON:  Portal chest radiograph 08/29/2013.  FINDINGS: Low lung volumes. Cardiac silhouette stable. Stable median sternotomy and coronary artery bypass grafting changes. No focal infiltrates, regions of consolidation, or effusions. Degenerative changes within the shoulders. No acute osseous abnormalities.  IMPRESSION: Low lung volumes without evidence of focal or acute cardiopulmonary disease.   Electronically Signed   By: Margaree Mackintosh M.D.   On: 09/02/2013 13:43   CXR: NACPD  ASSESSMENT / PLAN: NEUROLOGIC A:   Brain mass or lesion Recurrent seizures Acute post-ictal encephalopathy P:   - Neuro managing. - Keppra IV ordered - EEG per neuro - Transfer to tele. - CT abd ordered to look for possible primary cancer pending.  PULMONARY A: At risk for intubation P:   - Supp O2. - Oximetry. - Transfer to tele today.  CARDIOVASCULAR A:  H/O CAD, PVD, hypertension, hyperlipidemia, CHF (Echo 2009: LVEF 60%) Chronic beta blocker therapy P:  - Monitor BP and rhythm - Holding ASA, statin, metoprolol, isosorbide, accupril - PRN metoprolol to maintain HR < 115/min - PRN hydralazine to maintain SBP < 170 mmHg  RENAL A:   CKD P:   - KVO IVF - Monitor BMET intermittently - Monitor I/Os - Correct electrolytes as indicated  GASTROINTESTINAL A:   GERD, chronic H2RA therapy P:   - SUP: IV famotidine - Advance diet as tolerated.  HEMATOLOGIC A:   Mild anemia without acute blood loss P:  - DVT px: SQ heparin. - Monitor CBC intermittently. - Transfuse per usual ICU guidelines.  INFECTIOUS A:   Recent LLL absces and osteo - s/p amputation P:   - Monitor off abx  - Micro and abx as above  ENDOCRINE A:   DM II with apparent insulin resistance  P:   - Lantus  - SSI, moderate  scale - Holding metformin  MUSCULOSKELETAL: A: Recent L BKA P: - WOC to see for post op wound mgmt  Transfer to tele, transfer to [facility], PCCM will sign off, please call back if needed.  Rush Farmer, M.D. [facility] Pulmonary/Critical Care Medicine. Pager: [phone]. After hours pager: [phone].  09/04/2013, 10:06 AM

## 2013-09-05 LAB — GLUCOSE, CAPILLARY
GLUCOSE-CAPILLARY: 144 mg/dL — AB (ref 70–99)
Glucose-Capillary: 106 mg/dL — ABNORMAL HIGH (ref 70–99)
Glucose-Capillary: 139 mg/dL — ABNORMAL HIGH (ref 70–99)
Glucose-Capillary: 218 mg/dL — ABNORMAL HIGH (ref 70–99)

## 2013-09-05 LAB — CBC
HEMATOCRIT: 31 % — AB (ref 36.0–46.0)
Hemoglobin: 9.4 g/dL — ABNORMAL LOW (ref 12.0–15.0)
MCH: 24.4 pg — ABNORMAL LOW (ref 26.0–34.0)
MCHC: 30.3 g/dL (ref 30.0–36.0)
MCV: 80.3 fL (ref 78.0–100.0)
Platelets: 421 10*3/uL — ABNORMAL HIGH (ref 150–400)
RBC: 3.86 MIL/uL — ABNORMAL LOW (ref 3.87–5.11)
RDW: 18.5 % — AB (ref 11.5–15.5)
WBC: 7.5 10*3/uL (ref 4.0–10.5)

## 2013-09-05 LAB — BASIC METABOLIC PANEL
BUN: 11 mg/dL (ref 6–23)
CHLORIDE: 103 meq/L (ref 96–112)
CO2: 24 mEq/L (ref 19–32)
CREATININE: 1.11 mg/dL — AB (ref 0.50–1.10)
Calcium: 9.2 mg/dL (ref 8.4–10.5)
GFR calc Af Amer: 63 mL/min — ABNORMAL LOW (ref >90)
GFR calc non Af Amer: 54 mL/min — ABNORMAL LOW (ref >90)
Glucose, Bld: 107 mg/dL — ABNORMAL HIGH (ref 70–99)
Potassium: 3.7 mEq/L (ref 3.7–5.3)
Sodium: 141 mEq/L (ref 137–147)

## 2013-09-05 LAB — PHOSPHORUS: Phosphorus: 3.7 mg/dL (ref 2.3–4.6)

## 2013-09-05 LAB — MAGNESIUM: Magnesium: 1.4 mg/dL — ABNORMAL LOW (ref 1.5–2.5)

## 2013-09-05 MED ORDER — ACETAMINOPHEN 325 MG PO TABS
650.0000 mg | ORAL_TABLET | Freq: Four times a day (QID) | ORAL | Status: DC | PRN
Start: 2013-09-05 — End: 2013-09-05
  Administered 2013-09-05: 650 mg via ORAL
  Filled 2013-09-05: qty 2

## 2013-09-05 MED ORDER — LEVETIRACETAM ER 750 MG PO TB24: 1500.0000 mg | ORAL_TABLET | Freq: Every day | ORAL | Status: DC

## 2013-09-05 NOTE — Progress Notes (Signed)
CARE MANAGEMENT NOTE 09/05/2013  Patient:  [name], [name]   Account Number:  [id number]  Date Initiated:  09/05/2013  Documentation initiated by:  [facility]  Subjective/Objective Assessment:   seizures     Action/Plan:   lives at home with husband and son   Anticipated DC Date:  09/05/2013   Anticipated DC Plan:  [place]  CM consult      [facility] Choice  HOME HEALTH   Choice offered to / List presented to:  C-1 Patient        [place] arranged  HH-1 RN  HH-2 PT      [facility] agency  [facility]   Status of service:  Completed, signed off Medicare Important Message given?  YES (If response is "NO", the following Medicare IM given date fields will be blank) Date Medicare IM given:  09/05/2013 Date Additional Medicare IM given:    Discharge Disposition:  [place]  Per UR Regulation:    If discussed at Long Length of Stay Meetings, dates discussed:    Comments:  09/05/2013 1030 NCM spoke to pt and offered choice for [facility]. Pt states she does not want [facility]. [place] and they can accept referral. They will be out on Wednesday to see pt. Pt states she has RW, wheelchair, ramp and bedside commode at home. Son, Chance will be at home this summer to assist pt at home with care. Husband is also in the home to assist with her care. Jonnie Finner RN CCM Case Mgmt phone [phone]

## 2013-09-05 NOTE — Progress Notes (Signed)
NEURO HOSPITALIST PROGRESS NOTE   SUBJECTIVE:                                                                                                                        [name] stated that she is doing great and wants to go home today. Son at the bedside concurs that she is remarkably improved. No further seizures noted. MRI brain was limited by significant motion but essentially unchanged left frontal mass without significant edema. CT abdomen and pelvis unremarkable.  Tolerating keppra well.  OBJECTIVE:                                                                                                                           Vital signs in last 24 hours: Temp:  [97.3 F (36.3 C)-98.7 F (37.1 C)] 98.7 F (37.1 C) (06/22 0405) Pulse Rate:  [101-121] 101 (06/22 0455) Resp:  [13-18] 16 (06/22 0405) BP: (124-174)/(65-92) 151/92 mmHg (06/22 0405) SpO2:  [96 %-99 %] 98 % (06/22 0405) Weight:  [63.504 kg (140 lb)] 63.504 kg (140 lb) (06/21 1259)  Intake/Output from previous day: 06/21 0701 - 06/22 0700 In: 150 [I.V.:150] Out: 2000 [Urine:2000] Intake/Output this shift:   Nutritional status: Full Liquid  Past Medical History  Diagnosis Date  . Hypertension   . Neuropathy   . Peripheral vascular disease   . Coronary artery disease   . Anxiety   . Depression   . Anemia   . Type II diabetes mellitus   . High cholesterol   . History of blood transfusion     "one time; related to diabetes I guess"   . Arthritis     "hands"  . GERD (gastroesophageal reflux disease)     takes Zantac  . Complication of anesthesia     pt felt like she had a hard time waking up after surgery in Feb. 2015.  Marland Kitchen CHF (congestive heart failure)   . Seizures     Neurologic Exam:  Mental Status: sleepy but can be easily arouse.  Cranial Nerves: Pupils 4 mm bilaterally, reactive. No gaze preference. Doesn't blink to threat. Face appears to be symmetric. Tongue  midline.  Motor:  Moves all limbs spontaneously  Sensory: reacts to noxious stimuli  Deep Tendon Reflexes:  1+ all over  Plantars:  S/p BKA  Cerebellar:  Unable to test  Gait:  Unable to test   Lab Results: Lab Results  Component Value Date/Time   CHOL 113 08/30/2013  5:50 AM   Lipid Panel No results found for this basename: CHOL, TRIG, HDL, CHOLHDL, VLDL, LDLCALC,  in the last 72 hours  Studies/Results: Mr [name] Wo Contrast  09/03/2013   CLINICAL DATA:  Status epilepticus.  Evaluate lesion  EXAM: MRI HEAD WITHOUT AND WITH CONTRAST  TECHNIQUE: Multiplanar, multiecho pulse sequences of the brain and surrounding structures were obtained without and with intravenous contrast.  CONTRAST:  62mL MULTIHANCE GADOBENATE DIMEGLUMINE 529 MG/ML IV SOLN  COMPARISON:  MRI 08/30/2013, 08/29/2013  FINDINGS: Image quality degraded by motion. Initially the patient held still but quickly began moving extensively. Postcontrast images were obtained in the axial plane which are markedly degraded by motion. The study was terminated at this point and sagittal and coronal imaging postcontrast was not possible due to motion and lack of cooperation.  Abnormal signal in the left frontal parietal lobes again noted and unchanged. This is predominantly in the white matter in the centrum semiovale extending into the subcortical white matter. Questionable area of thickening in the left frontal cortex is unchanged. No significant mass-effect or effacement of the sulci in this area. This area does not show restricted diffusion. No obvious enhancement is seen on the prior study or on today's study although postcontrast images are suboptimal.  Left internal carotid artery is chronically occluded as noted on the prior study and 2009.  Mild atrophy. Negative for hydrocephalus. No acute infarct. No evidence of hemorrhage. No shift of the midline structures.  IMPRESSION: This study is significantly degraded by motion, especially on  the postcontrast images. Allowing for this no change from prior studies.  Left frontal parietal abnormality, primarily in the deep white matter and subcortical white matter. Differential includes chronic ischemia especially in light of the occluded left internal carotid artery. PML is a possibility. Tumor and infection are also considerations.   Electronically Signed   By: Franchot Gallo M.D.   On: 09/03/2013 17:33   Ct Abdomen Pelvis W Contrast  09/04/2013   CLINICAL DATA:  58 year old female with possible brain tumor. Evaluate for primary neoplasm or metastatic disease.  EXAM: CT ABDOMEN AND PELVIS WITH CONTRAST  TECHNIQUE: Multidetector CT imaging of the abdomen and pelvis was performed using the standard protocol following bolus administration of intravenous contrast.  CONTRAST:  171mL OMNIPAQUE IOHEXOL 300 MG/ML  COMPARISON:  None.  FINDINGS: The lung bases are clear.  Cardiomegaly and cardiac surgical changes noted.  The liver, adrenal glands, pancreas and kidneys are unremarkable.  The spleen is upper limits of normal in size without focal abnormalities.  [name] status post cholecystectomy.  There is no evidence of free fluid, enlarged lymph nodes, biliary dilation or abdominal aortic aneurysm.  The bowel is unremarkable.  There is no evidence of bowel obstruction, pneumoperitoneum or abscess.  A Foley catheter is present within the bladder, which has mild circumferential wall thickening.  The uterus and adnexal regions are unremarkable.  No acute or suspicious bony abnormalities are identified.  IMPRESSION: No evidence of primary neoplasm or metastatic disease.  Cardiomegaly and upper limits normal spleen size.  Mild circumferential bladder wall thickening -cystitis not excluded.   Electronically Signed   By: Hassan Rowan M.D.   On: 09/04/2013 16:06   Dg Chest [facility]  09/04/2013   CLINICAL DATA:  Respiratory failure  EXAM: PORTABLE CHEST - 1 VIEW  COMPARISON:  Portable chest radiograph dated  09/02/2013  FINDINGS: Cardiac silhouette stable. Stable median sternotomy changes. Lungs are clear. Degenerative changes within the shoulders. No acute osseous abnormalities.  IMPRESSION: No acute cardiopulmonary disease.   Electronically Signed   By: Margaree Mackintosh M.D.   On: 09/04/2013 08:51    MEDICATIONS                                                                                                                        Scheduled: . antiseptic oral rinse  15 mL Mouth Rinse BID  . famotidine  20 mg Oral q1800  . heparin subcutaneous  5,000 Units Subcutaneous 3 times per day  . insulin aspart  0-15 Units Subcutaneous 6 times per day  . insulin glargine  20 Units Subcutaneous QHS  . levETIRAcetam  750 mg Oral BID    ASSESSMENT/PLAN:                                                                                                            58 y/o with structural GTC seizures in the context of a left frontal mass of unclear etiology ( primary CNS tumor vs brain metastasis, unlikely infection), brought in with GTC SE that had resolved.  No electrographic seizures on c-EEG. Repeat MRI brain during this admission was compromised by motion but overall unchanged left frontal mass. Will check with lab if enough CSF saved to check JC virus, although her clinical syndrome don't quite consistemnt with PML. Patient can be discharge home on keppra XR 1,500 MG ONCE DAILY. Needs outpatient follow up with neurosurgery and neurology to check status of left frontal mass and seizure management.  Dorian Pod, MD [facility] [phone]  09/05/2013, 10:03 AM

## 2013-09-05 NOTE — Progress Notes (Signed)
Patient discharge teaching given, including activity, diet, follow-up appoints, and medications. Patient verbalized understanding of all discharge instructions. IV access was d/c'd. Vitals are stable. Skin is intact except as charted in most recent assessments. Foley d/c'd pt voided 150 cc of amber colored urine freely. Pt to be escorted out by NT, to be driven home by family.

## 2013-09-05 NOTE — Discharge Summary (Signed)
Physician Discharge Summary  [name] [id number] DOB: 1955-08-07 DOA: [id number]  PCP: [name], MD  Admit date: 09/02/2013 Discharge date: 09/05/2013  Time spent: 60 minutes  Recommendations for Outpatient Follow-up:  1.  Anti-epileptic drug management 2. Re-evaluate left frontal lobe mass, neurosurgery/neurology follow-up (see below) 3. Monitor Diabetes  Discharge Diagnoses:  Active Problems:   DIABETES MELLITUS   HYPERTENSION, BENIGN   CAD, ARTERY BYPASS GRAFT   SEIZURES, HX OF   Status epilepticus   Acute encephalopathy  Discharge Condition: Stable  Diet recommendation: General, carb modified for Diabetes  Filed Weights   09/02/13 1830 09/04/13 0400 09/04/13 1259  Weight: 63.9 kg (140 lb 14 oz) 62.4 kg (137 lb 9.1 oz) 63.504 kg (140 lb)    History of present illness:  58 y.o female was hospitalized 06/15 - 06/18 for new onset seizures. CT revealed edema and MR showed left frontal mass. LP was unremarkable for infections. She was loaded on Keppra prior to d/c. She was brought by family to ED on 06/19 after having another seizure in the morning, with persistent confusion and lethargy. Family denied fever, chills, head injury or fall. In the ED, the pt was lethargic, unable to follow commands, and had right sided gaze with horizontal nystagmus. Immediate neuro consult recommended IV Keppra and Ativan and was admitted to neuro ICU for status epilepticus. She was sequentially transferred to medical floor to manage her HTN, anxiety/depression, DM, GERD, CKD, and CHF with daily neurology follow-ups. Per son, pt had 1 seizure in 2009 and it was determined at that time she did not need long term antiepileptic drugs.   Hospital Course:   Generalized Tonic ClonicSeizures - Admitted 06/15 and again 06/19 for recurring seizures - Loaded on Keppra, d/c on Keppra XR 1500 mg once daily per neurology - CT showed edema, MR showed left frontal mass. Repeat MR was  motion-limited - LP and continuous EEG unremarkable,- Neuro consulted in ED and managed her care in Neuro ICU.No further seizures. - Neurology appointment made for outpatient for seizure management/and work up of brain mass   Left Frontal Mass - ? Primary brain tumor, ? Metastatic ?intracranial infection - 2 MRs revealed Infiltrative mass in the left frontal cortex with probable associated vasogenic edema extending caudally - LP unremarkable for HSV1,2,  CMVPCR, VDRL. CSF was clear. WBC, glucose, and protein within normal ranges  - CT abd/pelvis unremarkable in seeking primary cancer  - Neurosurgery outpatient to reassess left frontal mass-Dr Ghimire spoke with Dr Kary Kos over the phone,his office will call patient for follow up appt, I have asked the patient/son to call the office in a few days, if they don't hear from  H/o CAD, PVD, HTN, Hyperlipidemia, CHF (Echo 2009 EF 60%) -c/w Metoprolol - ASA, Statin, isosorbide, and accupril being held at this time  Bilateral BKA (Left 08/22/13) - pt was in accident in February and has complicated history of infections and damage to left foot - h/o debridement, MRSA positive, osteomyelitis - Left transtibial amputation completed 08/22/2013 - follows with vascular surgery, orthopedics, and wound care clinic   Diabetes Mellitus, type II - complicated by peripheral neuropathy, charcot foot, peripheral vascular disease, bilateral BKA.  - Hgb A1c 7.3 on 06/15 - c/w home regimen Metformin, Lantus, and Novolog   Procedures:  Fluoro- guided LP   Consultations:  Neurology, Dorian Pod, MD 06/19 through 06/22   Wound Care 06/21 recommended conservative cleansing, no change in management  Discharge Exam: Filed Vitals:   09/05/13 1004  BP: 139/76  Pulse: 114  Temp: 97.6 F (36.4 C)  Resp: 16    General:  WDWN. NAD. Awake in bed with son at bedside.  HEENT: Normocephalic, atraumatic. EOMI.  Cardiovascular:  RRR. No murmur, rubs,  gallops.  Respiratory: Normal respiratory effort. Clear to auscultation, bilaterally. Symmetric chest rise. Abdomen: soft, non-distended, non-tender. Positive bowel sounds.  Extremities: Bilateral BKA. No drainage, redness, or warmth from left wound. Neuro: awake and alert. EOMI. Smile is symmetric. Able to follow commands. Performed finger-to-nose movements with coordination and smoothness, and accuracy. RAMs were rapid, smooth, and coordinated. No pronator drift.       Discharge Instructions   Call MD for:    Complete by:  As directed   If seizure recurr     Diet - low sodium heart healthy    Complete by:  As directed      Increase activity slowly    Complete by:  As directed             Medication List    STOP taking these medications       levETIRAcetam 500 MG tablet  Commonly known as:  KEPPRA  Replaced by:  Levetiracetam 750 MG Tb24      TAKE these medications       amitriptyline 25 MG tablet  Commonly known as:  ELAVIL  Take 25 mg by mouth at bedtime as needed for sleep.     aspirin 81 MG tablet  Take 81 mg by mouth daily.     atorvastatin 80 MG tablet  Commonly known as:  LIPITOR  Take 80 mg by mouth daily.     clopidogrel 75 MG tablet  Commonly known as:  PLAVIX  Take 75 mg by mouth daily.     hydrALAZINE 50 MG tablet  Commonly known as:  APRESOLINE  Take 1 tablet (50 mg total) by mouth every 8 (eight) hours.     insulin NPH Human 100 UNIT/ML injection  Commonly known as:  HUMULIN N,NOVOLIN N  Inject 25-35 Units into the skin 2 (two) times daily before a meal. 35 units in the morning and 25 units in the evening     isosorbide mononitrate 30 MG 24 hr tablet  Commonly known as:  IMDUR  Take 1 tablet (30 mg total) by mouth daily.     Levetiracetam 750 MG Tb24  Commonly known as:  KEPPRA XR  Take 2 tablets (1,500 mg total) by mouth daily.     metFORMIN 500 MG tablet  Commonly known as:  GLUCOPHAGE  Take 1 tablet (500 mg total) by mouth 4 (four)  times daily.     metoprolol 50 MG tablet  Commonly known as:  LOPRESSOR  Take 0.5 tablets (25 mg total) by mouth 2 (two) times daily.     multivitamins ther. w/minerals Tabs tablet  Take 1 tablet by mouth daily.     oxyCODONE-acetaminophen 5-325 MG per tablet  Commonly known as:  ROXICET  Take 1 tablet by mouth every 4 (four) hours as needed for severe pain.     quinapril 40 MG tablet  Commonly known as:  ACCUPRIL  Take 40 mg by mouth daily.     ranitidine 150 MG tablet  Commonly known as:  ZANTAC  Take 150 mg by mouth 2 (two) times daily.     sertraline 100 MG tablet  Commonly known as:  ZOLOFT  Take 100 mg by mouth 2 (two) times daily.       Allergies  Allergen Reactions  . Tape Rash    Redness, rash, itchiness   Follow-up Information   Follow up with [name], MD. Schedule an appointment as soon as possible for a visit in 1 week.   Specialty:  Family Medicine      Follow up with [facility], MD. Schedule an appointment as soon as possible for a visit in 10 days.   Specialty:  Neurosurgery   Contact information:   [address] [place] [address] [phone]       Follow up with Cameron Sprang, MD On 09/14/2013. (appointment at 3 pm)    Specialty:  Neurology   Contact information:   [place] [address] [phone]       Follow up with [facility]. (Home Health RN and Physical Therapy)    Contact information:   [phone]      The results of significant diagnostics from this hospitalization (including imaging, microbiology, ancillary and laboratory) are listed below for reference.    Significant Diagnostic Studies:  Ct Head Wo Contrast  09/02/2013   CLINICAL DATA:  SEIZURES  EXAM: CT HEAD WITHOUT CONTRAST  TECHNIQUE: Contiguous axial images were obtained from the base of the skull through the vertex without intravenous contrast.  COMPARISON:  Head CT dated 08/29/2013, Brain MRI dated  08/30/2013  FINDINGS: The diffuse area of low-attenuation within the centrum semiovale and corona radiata regions on the left is stable. There is no evidence of mass effect. No evidence of acute hemorrhage. No extra-axial fluid collections. No acute territorial infarction. The ventricles and cisterns are patent.  Visualized paranasal sinuses are unremarkable. No acute cranial abnormalities.  IMPRESSION: Stable findings in the left cerebral hemisphere consistent with an infiltrative process on MRI. No acute intracranial abnormality.   Electronically Signed   By: Margaree Mackintosh M.D.   On: 09/02/2013 14:21   Ct Head Wo Contrast  08/29/2013   CLINICAL DATA:  Loss of consciousness.  Seizure like activity.  EXAM: CT HEAD WITHOUT CONTRAST  TECHNIQUE: Contiguous axial images were obtained from the base of the skull through the vertex without intravenous contrast.  COMPARISON:  10/05/2007.  FINDINGS: No midline shift, hydrocephalus, hemorrhage. No acute territorial cortical ischemia/infarct. Atrophy and chronic ischemic white matter disease is present. Motion artifact is present on the examination. The patient was moving. The slices were repeated. Intracranial atherosclerosis is present. Mild ethmoid air cell opacification on the right. The mastoid air cells appear normal. Sphenoid sinuses clear.  IMPRESSION: 1. Low attenuation in the left corona radiata and centrum semiovale is suspicious for vasogenic edema. This could represent asymmetric chronic ischemic white matter disease however the amount of progression since 2009 would be unusual. Followup MRI recommended for further assessment, preferably with and without contrast. 2. Study mildly degraded by motion artifact. 3. No definite acute intracranial abnormality.   Electronically Signed   By: Dereck Ligas M.D.   On: 08/29/2013 01:42   Ct Angio Chest Pe W/cm &/or Wo Cm  08/29/2013   CLINICAL DATA:  Syncope.  Assess for pulmonary embolus.  EXAM: CT ANGIOGRAPHY  CHEST WITH CONTRAST  TECHNIQUE: Multidetector CT imaging of the chest was performed using the standard protocol during bolus administration of intravenous contrast. Multiplanar CT image reconstructions and MIPs were obtained to evaluate the vascular anatomy.  CONTRAST:  132mL OMNIPAQUE IOHEXOL 350 MG/ML SOLN  COMPARISON:  Chest radiograph performed earlier today at 12:58 a.m.  FINDINGS: There is no evidence  of pulmonary embolus.  Minimal bilateral dependent subsegmental atelectasis is noted. The lungs are otherwise clear. There is no evidence of significant focal consolidation, pleural effusion or pneumothorax. No masses are identified; no abnormal focal contrast enhancement is seen.  Diffuse coronary artery calcifications are seen. [name] status post median sternotomy. No pericardial effusion is identified. Visualized mediastinal nodes remain normal in size. The great vessels are grossly unremarkable in appearance. No axillary lymphadenopathy is seen. The visualized portions of the thyroid gland are unremarkable in appearance.  The visualized portions of the liver are unremarkable. The spleen is enlarged, measuring 16.6 cm in length. [name] status post cholecystectomy, with clips noted along the gallbladder fossa.  No acute osseous abnormalities are seen.  Review of the MIP images confirms the above findings.  IMPRESSION: 1. No evidence of pulmonary embolus. 2. Minimal bilateral dependent atelectasis noted; lungs otherwise clear. 3. Diffuse coronary artery calcifications seen. 4. Splenomegaly noted.   Electronically Signed   By: Garald Balding M.D.   On: 08/29/2013 04:42   Mr Brain Wo Contrast  08/29/2013   CLINICAL DATA:  Altered mental status. Diabetes. Generalized seizure, elevated blood pressure.  EXAM: MRI HEAD WITHOUT CONTRAST  TECHNIQUE: Multiplanar, multiecho pulse sequences of the brain and surrounding structures were obtained without intravenous contrast.  COMPARISON:  CT head  08/29/2013.  MR head 10/05/2007.  FINDINGS: The study is markedly degraded by motion. In addition, post infusion imaging could not be obtained despite IV administration of contrast. Therefore the study is of only limited usefulness.  Re-demonstrated from today's CT is an asymmetric area of edema in the left hemisphere primarily affecting the anterior frontal cortex within and just anterior to the supplemental motor area. The cortex displays T2 and FLAIR hyperintensity, and there is abnormal signal extending caudally as far as the posterior limb internal capsule on the left.  There is no midline shift. No other similar areas are seen. No restricted diffusion to suggest acute stroke. No acute or chronic hemorrhage.  Premature for age cerebral and cerebellar atrophy. Premature for age chronic microvascular ischemic change. Flow voids are maintained. No acute sinus or mastoid disease. Negative orbits.  Compared with prior MR, the atrophy and small vessel disease has progressed. There was no mass lesion in the left hemisphere in 2009.  IMPRESSION: Infiltrative mass in the left frontal cortex with probable associated vasogenic edema extending caudally. Considerations include primary brain tumor, metastatic disease, or intracranial infection, either bacterial or viral (PML). If the patient is more cooperative, post infusion imaging could be re-attempted, possibly providing more specificity. Alternately, tissue sampling could be performed.   Electronically Signed   By: Rolla Flatten M.D.   On: 08/29/2013 13:40   Mr Jeri Cos Contrast  08/30/2013   ADDENDUM REPORT: 08/30/2013 14:26  ADDENDUM: Study reviewed in person with Dr. Roland Rack on 08/30/2013 at 1355 hrs.  We discussed that the left ICA flow void has been absent since 2009. Also, the patient has not had convincing symptoms of left MCA territory ischemia.  Leading differential consideration based on clinical and imaging constellation at this point is viral CNS  infection.  Lumbar puncture for CSF analysis will be pursued, probably following platelet transfusion since the patient is currently on aspirin and Plavix.   Electronically Signed   By: Lars Pinks M.D.   On: 08/30/2013 14:26   08/30/2013   CLINICAL DATA:  58 year old female presents for post-contrast imaging to evaluate infiltrative left frontal lobe mass. Loss of  consciousness with seizure-like activity.  EXAM: MRI HEAD WITH CONTRAST  TECHNIQUE: Multiplanar, multiecho pulse sequences of the brain and surrounding structures were obtained with intravenous contrast.  COMPARISON:  Brain MRI without contrast 08/29/2013. Head CT 08/29/2013. Brain MRI 10/05/2007.  CONTRAST:  57mL MULTIHANCE GADOBENATE DIMEGLUMINE 529 MG/ML IV SOLN  FINDINGS: Study is intermittently degraded by motion artifact despite repeated imaging attempts.  Diffusion imaging was repeated, but is of lesser quality than that done yesterday which demonstrated facilitated diffusion in the affected left frontal lobe and corona radiata.  Confluent white matter T2 and FLAIR hyperintensity persists in the same pattern as demonstrated yesterday.  Also there is asymmetric visualization on FLAIR of some left MCA branches about the operculum.  The area of involvement was hypo intense on noncontrast T1 weighted imaging. On today's post-contrast T1 weighted imaging no enhancement is evident.  No abnormal enhancement elsewhere in the brain.  IMPRESSION: 1. Motion degraded exam despite repeated imaging attempts, image quality less than that obtained on yesterday's study. 2. No convincing enhancement to correspond to the left frontal lobe lesion. 3. Suggestion of abnormal flow in the left MCA branches. Recommend followup head and neck CTA (considering that MRA is unlikely to be successful with the patient's degree of motion).  Electronically Signed: By: Lars Pinks M.D. On: 08/30/2013 13:48   Mr Jeri Cos [id number] Contrast  09/03/2013   CLINICAL DATA:  Status epilepticus.   Evaluate lesion  EXAM: MRI HEAD WITHOUT AND WITH CONTRAST  TECHNIQUE: Multiplanar, multiecho pulse sequences of the brain and surrounding structures were obtained without and with intravenous contrast.  CONTRAST:  37mL MULTIHANCE GADOBENATE DIMEGLUMINE 529 MG/ML IV SOLN  COMPARISON:  MRI 08/30/2013, 08/29/2013  FINDINGS: Image quality degraded by motion. Initially the patient held still but quickly began moving extensively. Postcontrast images were obtained in the axial plane which are markedly degraded by motion. The study was terminated at this point and sagittal and coronal imaging postcontrast was not possible due to motion and lack of cooperation.  Abnormal signal in the left frontal parietal lobes again noted and unchanged. This is predominantly in the white matter in the centrum semiovale extending into the subcortical white matter. Questionable area of thickening in the left frontal cortex is unchanged. No significant mass-effect or effacement of the sulci in this area. This area does not show restricted diffusion. No obvious enhancement is seen on the prior study or on today's study although postcontrast images are suboptimal.  Left internal carotid artery is chronically occluded as noted on the prior study and 2009.  Mild atrophy. Negative for hydrocephalus. No acute infarct. No evidence of hemorrhage. No shift of the midline structures.  IMPRESSION: This study is significantly degraded by motion, especially on the postcontrast images. Allowing for this no change from prior studies.  Left frontal parietal abnormality, primarily in the deep white matter and subcortical white matter. Differential includes chronic ischemia especially in light of the occluded left internal carotid artery. PML is a possibility. Tumor and infection are also considerations.   Electronically Signed   By: Franchot Gallo M.D.   On: 09/03/2013 17:33   Ct Abdomen Pelvis W Contrast  09/04/2013   CLINICAL DATA:  58 year old female  with possible brain tumor. Evaluate for primary neoplasm or metastatic disease.  EXAM: CT ABDOMEN AND PELVIS WITH CONTRAST  TECHNIQUE: Multidetector CT imaging of the abdomen and pelvis was performed using the standard protocol following bolus administration of intravenous contrast.  CONTRAST:  120mL OMNIPAQUE IOHEXOL 300 MG/ML  COMPARISON:  None.  FINDINGS: The lung bases are clear.  Cardiomegaly and cardiac surgical changes noted.  The liver, adrenal glands, pancreas and kidneys are unremarkable.  The spleen is upper limits of normal in size without focal abnormalities.  [name] status post cholecystectomy.  There is no evidence of free fluid, enlarged lymph nodes, biliary dilation or abdominal aortic aneurysm.  The bowel is unremarkable.  There is no evidence of bowel obstruction, pneumoperitoneum or abscess.  A Foley catheter is present within the bladder, which has mild circumferential wall thickening.  The uterus and adnexal regions are unremarkable.  No acute or suspicious bony abnormalities are identified.  IMPRESSION: No evidence of primary neoplasm or metastatic disease.  Cardiomegaly and upper limits normal spleen size.  Mild circumferential bladder wall thickening -cystitis not excluded.   Electronically Signed   By: Hassan Rowan M.D.   On: 09/04/2013 16:06   Dg Chest Port 1 View  09/04/2013   CLINICAL DATA:  Respiratory failure  EXAM: PORTABLE CHEST - 1 VIEW  COMPARISON:  Portable chest radiograph dated 09/02/2013  FINDINGS: Cardiac silhouette stable. Stable median sternotomy changes. Lungs are clear. Degenerative changes within the shoulders. No acute osseous abnormalities.  IMPRESSION: No acute cardiopulmonary disease.   Electronically Signed   By: Margaree Mackintosh M.D.   On: 09/04/2013 08:51   Dg Chest Port 1 View  09/02/2013   CLINICAL DATA:  lethargic  EXAM: PORTABLE CHEST - 1 VIEW  COMPARISON:  Portal chest radiograph 08/29/2013.  FINDINGS: Low lung volumes. Cardiac silhouette stable.  Stable median sternotomy and coronary artery bypass grafting changes. No focal infiltrates, regions of consolidation, or effusions. Degenerative changes within the shoulders. No acute osseous abnormalities.  IMPRESSION: Low lung volumes without evidence of focal or acute cardiopulmonary disease.   Electronically Signed   By: Margaree Mackintosh M.D.   On: 09/02/2013 13:43   Dg Chest Port 1 View  08/29/2013   CLINICAL DATA:  Loss of consciousness.  EXAM: PORTABLE CHEST - 1 VIEW  COMPARISON:  05/11/2013.  FINDINGS: Cardiopericardial silhouette within normal limits for projection. Chronic blurring of the left heart border, likely associated with a large pericardial fat pad. Lung volumes are lower than on prior examinations. Basilar atelectasis. Median sternotomy/ CABG. Monitoring leads project over the chest. No airspace disease. No effusion.  IMPRESSION: Low volume chest.   Electronically Signed   By: Dereck Ligas M.D.   On: 08/29/2013 01:13   Dg Fluoro Guide Lumbar Puncture  08/31/2013   CLINICAL DATA:  58 year old female with altered mental status, seizure, abnormal brain MRI. Diagnostic lumbar puncture for CSF analysis requested under fluoroscopic guidance due to the patient being on Plavix. Initial encounter.  EXAM: DIAGNOSTIC LUMBAR PUNCTURE UNDER FLUOROSCOPIC GUIDANCE  FLUOROSCOPY TIME:  0 min and 21 seconds.  PROCEDURE: The patient underwent platelet transfusion prior to the procedure.  Informed consent was obtained from the patient and her son prior to the procedure, including potential complications of headache, allergy, and pain. A "time-out" was performed.  With the patient prone, the lower back was prepped with Betadine. 1% Lidocaine was used for local anesthesia. Lumbar puncture was performed at the L4-L5 level using right sub laminar technique, a 3.5 inch x 20 gauge needle with return of clear, colorless CSF with an opening pressure of 17 cm water. 32ml of CSF were obtained for laboratory studies.   The needle was withdrawn, direct pressure was held, and hemostasis was noted. Appropriate postprocedural orders were placed. The patient tolerated  the procedure well and there were no apparent complications.  IMPRESSION: Diagnostic lumbar puncture at L4-L5. Opening pressure 17 cm of water. Clear, colorless CSF sent to the lab for analysis.   Electronically Signed   By: Lars Pinks M.D.   On: 08/31/2013 16:30    Microbiology: Recent Results (from the past 240 hour(s))  CULTURE, BLOOD (ROUTINE X 2)     Status: None   Collection Time    08/29/13  1:06 AM      Result Value Ref Range Status   Specimen Description BLOOD RIGHT ARM   Final   Special Requests BOTTLES DRAWN AEROBIC AND ANAEROBIC 10CC EACH   Final   Culture  Setup Time     Final   Value: 08/29/2013 04:13     Performed at [facility]   Culture     Final   Value: NO GROWTH 5 DAYS     Performed at [facility]   Report Status 09/04/2013 FINAL   Final  CULTURE, BLOOD (ROUTINE X 2)     Status: None   Collection Time    08/29/13  1:11 AM      Result Value Ref Range Status   Specimen Description BLOOD RIGHT WRIST   Final   Special Requests BOTTLES DRAWN AEROBIC ONLY 5CC   Final   Culture  Setup Time     Final   Value: 08/29/2013 04:13     Performed at [facility]   Culture     Final   Value: NO GROWTH 5 DAYS     Performed at [facility]   Report Status 09/04/2013 FINAL   Final  MRSA PCR SCREENING     Status: None   Collection Time    08/29/13  3:42 AM      Result Value Ref Range Status   MRSA by PCR NEGATIVE  NEGATIVE Final   Comment:            The GeneXpert MRSA Assay (FDA     approved for NASAL specimens     only), is one component of a     comprehensive MRSA colonization     surveillance program. It is not     intended to diagnose MRSA     infection nor to guide or     monitor treatment for     MRSA infections.  CSF CULTURE     Status: None   Collection Time    08/31/13  4:15 PM       Result Value Ref Range Status   Specimen Description CSF   Final   Special Requests 7.0ML   Final   Gram Stain     Final   Value: WBC PRESENT, PREDOMINANTLY MONONUCLEAR     NO ORGANISMS SEEN     CYTOSPSIN Performed at [facility]     Performed at [facility]   Culture     Final   Value: NO GROWTH 3 DAYS     Performed at [facility]   Report Status 09/04/2013 FINAL   Final  GRAM STAIN     Status: None   Collection Time    08/31/13  4:15 PM      Result Value Ref Range Status   Specimen Description CSF   Final   Special Requests 7.0ML FLUID   Final   Gram Stain     Final   Value: CYTOSPIN PREP     SCANT MONONUCLEAR  WBC  PRESENT     NO ORGANISMS SEEN   Report Status 08/31/2013 FINAL   Final  MRSA PCR SCREENING     Status: None   Collection Time    09/02/13  6:40 PM      Result Value Ref Range Status   MRSA by PCR NEGATIVE  NEGATIVE Final   Comment:            The GeneXpert MRSA Assay (FDA     approved for NASAL specimens     only), is one component of a     comprehensive MRSA colonization     surveillance program. It is not     intended to diagnose MRSA     infection nor to guide or     monitor treatment for     MRSA infections.     Labs: Basic Metabolic Panel:  Recent Labs Lab 08/30/13 0550 08/31/13 0440 09/02/13 1445 09/03/13 0230 09/04/13 0209 09/05/13 0355  NA 142 141 142 142 145 141  K 3.3* 3.6* 3.8 3.4* 3.3* 3.7  CL 103 101 100 104 105 103  CO2 25 27 27 26 24 24   GLUCOSE 227* 169* 267* 146* 149* 107*  BUN 10 10 17 21 15 11   CREATININE 1.32* 1.18* 1.25* 1.24* 1.10 1.11*  CALCIUM 8.6 9.4 8.8 8.5 9.1 9.2  MG 1.9  --   --   --  1.4* 1.4*  PHOS  --   --   --   --  2.3 3.7   Liver Function Tests:  Recent Labs Lab 09/02/13 1445  AST 25  ALT 17  ALKPHOS 123*  BILITOT 0.5  PROT 7.7  ALBUMIN 2.7*   CBC:  Recent Labs Lab 08/31/13 0440 09/02/13 1445 09/03/13 0230 09/04/13 0209 09/05/13 0355  WBC 6.6 8.8 7.4 7.4 7.5   NEUTROABS  --  8.0*  --   --   --   HGB 8.8* 9.7* 8.8* 8.7* 9.4*  HCT 29.5* 31.8* 29.3* 29.0* 31.0*  MCV 80.6 80.7 82.3 80.8 80.3  PLT 432* 429* 383 420* 421*   Cardiac Enzymes:  Recent Labs Lab 09/02/13 1445  TROPONINI <0.30   CBG:  Recent Labs Lab 09/04/13 2006 09/05/13 0013 09/05/13 0403 09/05/13 0751 09/05/13 1158  GLUCAP 192* 144* 106* 139* 218*     Signed: Martinique Hausladen, PA-S  [facility] 09/05/2013, 12:34 PM  Attending Patient was seen, examined,treatment plan was discussed with the Physician extender. I have directly reviewed the clinical findings, lab, imaging studies and management of this patient in detail. I have made the necessary changes to the above noted documentation, and agree with the documentation, as recorded by the Physician extender.  Nena Alexander MD [facility].

## 2013-09-09 NOTE — Care Management Note (Signed)
    Page 1 of 2   09/09/2013     10:45:52 AM CARE MANAGEMENT NOTE 09/09/2013  Patient:  [name], [name]   Account Number:  [id number]  Date Initiated:  09/05/2013  Documentation initiated by:  [facility]  Subjective/Objective Assessment:   seizures     Action/Plan:   lives at home with husband and son   Anticipated DC Date:  09/05/2013   Anticipated DC Plan:  [place]  CM consult      [facility] Choice  HOME HEALTH   Choice offered to / List presented to:  C-1 Patient        [place] arranged  HH-1 RN  HH-2 PT      [facility] agency  [facility]   Status of service:  Completed, signed off Medicare Important Message given?  YES (If response is "NO", the following Medicare IM given date fields will be blank) Date Medicare IM given:  09/05/2013 Date Additional Medicare IM given:    Discharge Disposition:  [place]  Per UR Regulation:    If discussed at Long Length of Stay Meetings, dates discussed:    Comments:  09/09/13 [place], BSN [phone] received information from [place] that patient called and stated no [facility] agency has been out to see her.  NCM called [facility] and spoke with the intake rep Anglea [phone] she states that they have been trying to reach this patient since 6/24, they attempted to call her several times did not get an answer, patient's spouse called back stating that patient was at his mother's funeral with him.  Levada Dy stated they tried to call patient on 6/25 and did not get an answer so they left a message. The scheduler finally got a hold of her today and they will be going out today to see patient.  09/05/2013 1030 NCM spoke to pt and offered choice for [facility]. Pt states she does not want [facility]. [place] and they can accept referral. They will be out on Wednesday to see pt. Pt states she has RW, wheelchair, ramp and bedside commode at home. Son, Chance will be at home  this summer to assist pt at home with care. Husband is also in the home to assist with her care. Jonnie Finner RN CCM Case Mgmt phone [phone]

## 2013-09-14 ENCOUNTER — Ambulatory Visit (INDEPENDENT_AMBULATORY_CARE_PROVIDER_SITE_OTHER): Payer: Medicare Other | Admitting: Neurology

## 2013-09-14 ENCOUNTER — Encounter: Payer: Self-pay | Admitting: Neurology

## 2013-09-14 VITALS — BP 118/60 | Ht 65.0 in | Wt 140.0 lb

## 2013-09-14 DIAGNOSIS — I25810 Atherosclerosis of coronary artery bypass graft(s) without angina pectoris: Secondary | ICD-10-CM

## 2013-09-14 DIAGNOSIS — G9389 Other specified disorders of brain: Secondary | ICD-10-CM

## 2013-09-14 DIAGNOSIS — G939 Disorder of brain, unspecified: Secondary | ICD-10-CM

## 2013-09-14 DIAGNOSIS — G40219 Localization-related (focal) (partial) symptomatic epilepsy and epileptic syndromes with complex partial seizures, intractable, without status epilepticus: Secondary | ICD-10-CM

## 2013-09-14 LAB — MISCELLANEOUS TEST

## 2013-09-14 NOTE — Progress Notes (Signed)
NEUROLOGY CONSULTATION NOTE  [name] MRN: [id number] DOB: June 02, 1955  Referring provider: Dr. Dorian Pod Primary care provider: Dr. [name]  Reason for consult:  New onset seizures, abnormal MRI brain  Dear Dr Armida Sans:  Thank you for your kind referral of [name] for consultation of the above symptoms. Although her history is well known to you, please allow me to reiterate it for the purpose of our medical record. The patient was accompanied to the clinic by her son who also provides collateral information. Records and images were personally reviewed where available.  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  She was admitted for left foot osteomyelitis due to poor healing wound after a fall last winter.  She underwent left transtibial amputation on 08/22/13. She was discharged home on 06/11, then started having altered mental status on 06/15. Her son reports she had hit her head in the left frontal region when getting into their car, then started acting drowsy and out of it the next day.  That evening, they were getting ready to eat when her right arm flexed and clenched into her chest followed by head turn to the left,stiffening and shaking for 1 minute as she fell to her left side.  She was confused for 10 minutes, back to baseline within 30 minutes.  She was brought to [facility] ER where BP was noted to be 205/87. She was given IV labetalol with slow improvement in BP. Blood sugar noted to be 303.  Head CT showed low attenuation in the left corona radiata and centrum semiovale.  She had an MRI brain initially without contrast which showed increased T2 and FLAIR signal in the left hemisphere from the anterior frontal cortex, supplemental motor area, extending down to the  posterior limb of the internal capsule.  She became agitated and needed sedation for MRI brain with contrast, however again became restless with motion degradation, no abnormal enhancement seen. Left ICA flow void was noted to be absent, similar since 2009. It was felt that symptoms were not consistent with left MCA ischemia and concern was for viral CNS infection and received 4 days of empiric IV Acyclovir. She underwent a lumbar puncture which showed WBC 1, RBC 17, glucose 82, protein 29, VDRL, CMV, HSV, arbovirus, gram stain and culture negative. No malignant cells on cytology.  She was started on Keppra with no further seizures, discharged home on 06/18.  She was apparently intermittently confused at home, woke up better then next day, then had another seizure that morning.  Her son today reports she had a generalized convulsion with head deviation to left for 5 minutes.  On arrival to the ER, per records, she was poorly responsive with right gaze deviation, nystagmoid eye movements. BP 197/80 She was given IV Ativan, according to her son she remained in this state for an hour, then was unresponsive for 36 hours after. Prolonged EEG showed diffuse slowing, no electrographic seizures or epileptiform discharges. Repeat MRI brain with and without contrast was again degraded by motion.  Similar abnormal signal in the left frontal and parietal lobes again noted, predominantly in the centrum semiovale extending into the subcortical white matter. There is an area of questionable thickening in the left frontal cortex noted to be unchanged, no effacement of sulci in this area, no restricted diffusion,  no abnormal enhancement although study is suboptimal. Chronically occluded left ICA again noted. She was discharged home on Keppra XR 1500mg /day.  Her son denies any further convulsions, however she did have a brief 1 minute episode 2 days ago where her speech was slurred and mouth appeared deviated to the right.  She  recalls feeling her speech was off and endorsed a headache.  She does not recall having any prior warning symptoms before the seizures.  Of note, in 2009, she had a nocturnal convulsion and was admitted to [facility].  Records unavailable for review, according to her son she was not started on seizure medication due to first seizure. She had an MRI brain at that time which I personally reviewed, it was noted to be limited by motion and patient refusing to complete study, however on FLAIR sequences, white matter changes in the left hemisphere are not present.  Since hospital discharge, her son notes that she gets confused and slow to respond when tired. He feels the Keppra makes her drowsy in the morning. She is more irritable.  She feels good since discharge home, denies any focal weakness, numbness, or tingling.  Her son does note that when tired she seems to be slightly weaker on her right side.  She denies any headaches, dizziness, diplopia, neck/back pain, bowel/bladder dysfunction.  Epilepsy Risk Factors: Left frontal white matter lesion.  Otherwise she had a normal birth and early development.  There is no history of febrile convulsions, significant traumatic brain injury, neurosurgical procedures, or family history of seizures.  PAST MEDICAL HISTORY: Past Medical History  Diagnosis Date  . Hypertension   . Neuropathy   . Peripheral vascular disease   . Coronary artery disease   . Anxiety   . Depression   . Anemia   . Type II diabetes mellitus   . High cholesterol   . History of blood transfusion     "one time; related to diabetes I guess"   . Arthritis     "hands"  . GERD (gastroesophageal reflux disease)     takes Zantac  . Complication of anesthesia     pt felt like she had a hard time waking up after surgery in Feb. 2015.  Marland Kitchen CHF (congestive heart failure)   . Seizures     PAST SURGICAL HISTORY: Past Surgical History  Procedure Laterality Date  . Toe amputation Right     "took off  a couple toes before the amputation"  . Below knee leg amputation Right Jan. 2008  . Carotid endarterectomy Right   . Cholecystectomy    . Ankle fusion Left 05/11/2013    TIBIOCALCANEAL FUSION   . Tonsillectomy    . Dilation and curettage of uterus    . Coronary artery bypass graft  12/01/2007    "CABG X4" (05/12/2013)  . Ankle fusion Left 05/12/2013    Procedure: LEFT TIBIOCALCANEAL FUSION;  Surgeon: Newt Minion, MD;  Location: [place];  Service: Orthopedics;  Laterality: Left;  Left Tibiocalcaneal Fusion  . Colonoscopy    . Irrigation and debridement abscess Left 06/17/2013    ANKLE           DR [name]  . I&d extremity Left 06/17/2013    Procedure: IRRIGATION AND DEBRIDEMENT EXTREMITY, PLACEMENT ANTIBIOTIC STIMULAN BEADS;  Surgeon: Newt Minion, MD;  Location: [place];  Service: Orthopedics;  Laterality: Left;  . Application of wound vac  06/17/2013    Procedure: APPLICATION OF WOUND VAC;  Surgeon: Newt Minion,  MD;  Location: [place];  Service: Orthopedics;;  . Amputation Left 08/22/2013    Procedure: LEFT BELOW KNEE AMPUTATION;  Surgeon: Newt Minion, MD;  Location: [place];  Service: Orthopedics;  Laterality: Left;    MEDICATIONS: Current Outpatient Prescriptions on File Prior to Visit  Medication Sig Dispense Refill  . amitriptyline (ELAVIL) 25 MG tablet Take 25 mg by mouth at bedtime as needed for sleep.       Marland Kitchen aspirin 81 MG tablet Take 81 mg by mouth daily.        Marland Kitchen atorvastatin (LIPITOR) 80 MG tablet Take 80 mg by mouth daily.        . clopidogrel (PLAVIX) 75 MG tablet Take 75 mg by mouth daily.       . hydrALAZINE (APRESOLINE) 50 MG tablet Take 1 tablet (50 mg total) by mouth every 8 (eight) hours.  90 tablet  0  . insulin NPH Human (HUMULIN N,NOVOLIN N) 100 UNIT/ML injection Inject 25-35 Units into the skin 2 (two) times daily before a meal. 35 units in the morning and 25 units in the evening      . isosorbide mononitrate (IMDUR) 30 MG 24 hr tablet Take 1 tablet (30 mg total) by mouth  daily.  30 tablet  0  . Levetiracetam (KEPPRA XR) 750 MG TB24 Take 2 tablets (1,500 mg total) by mouth daily.  120 tablet  0  . metFORMIN (GLUCOPHAGE) 500 MG tablet Take 1 tablet (500 mg total) by mouth 4 (four) times daily.      . metoprolol tartrate (LOPRESSOR) 50 MG tablet Take 0.5 tablets (25 mg total) by mouth 2 (two) times daily.  60 tablet  0  . Multiple Vitamins-Minerals (MULTIVITAMINS THER. W/MINERALS) TABS Take 1 tablet by mouth daily.        Marland Kitchen oxyCODONE-acetaminophen (ROXICET) 5-325 MG per tablet Take 1 tablet by mouth every 4 (four) hours as needed for severe pain.  60 tablet  0  . quinapril (ACCUPRIL) 40 MG tablet Take 40 mg by mouth daily.       . ranitidine (ZANTAC) 150 MG tablet Take 150 mg by mouth 2 (two) times daily.       . sertraline (ZOLOFT) 100 MG tablet Take 100 mg by mouth 2 (two) times daily.        No current facility-administered medications on file prior to visit.    ALLERGIES: Allergies  Allergen Reactions  . Tape Rash    Redness, rash, itchiness    FAMILY HISTORY: Family History  Problem Relation Age of Onset  . Cancer Mother   . Heart disease Father   . Diabetes Father   . Hyperlipidemia Father   . Hypertension Father     SOCIAL HISTORY: History   Social History  . Marital Status: Married    Spouse Name: N/A    Number of Children: N/A  . Years of Education: N/A   Occupational History  . Not on file.   Social History Main Topics  . Smoking status: Never Smoker   . Smokeless tobacco: Never Used  . Alcohol Use: No  . Drug Use: No  . Sexual Activity: Not Currently   Other Topics Concern  . Not on file   Social History Narrative  . No narrative on file    REVIEW OF SYSTEMS: Constitutional: No fevers, chills, or sweats, no generalized fatigue, change in appetite Eyes: No visual changes, double vision, eye pain Ear, nose and throat: No hearing loss, ear pain, nasal  congestion, sore throat Cardiovascular: No chest pain,  palpitations Respiratory:  No shortness of breath at rest or with exertion, wheezes GastrointestinaI: No nausea, vomiting, diarrhea, abdominal pain, fecal incontinence Genitourinary:  No dysuria, urinary retention or frequency Musculoskeletal:  No neck pain, back pain Integumentary: No rash, pruritus, skin lesions Neurological: as above Psychiatric: No depression, insomnia, anxiety Endocrine: No palpitations, fatigue, diaphoresis, mood swings, change in appetite, change in weight, increased thirst Hematologic/Lymphatic:  No anemia, purpura, petechiae. Allergic/Immunologic: no itchy/runny eyes, nasal congestion, recent allergic reactions, rashes  PHYSICAL EXAM: Filed Vitals:   09/14/13 1435  BP: 118/60   General: No acute distress, wheelchair-bound Head:  Normocephalic/atraumatic Eyes: Fundoscopic exam shows bilateral sharp discs, no vessel changes, exudates, or hemorrhages Neck: supple, no paraspinal tenderness, full range of motion Back: No paraspinal tenderness Heart: regular rate and rhythm Lungs: Clear to auscultation bilaterally. Vascular: No carotid bruits. Skin/Extremities: No rash, s/p right BKA with prosthesis, s/p left transtibial amputation with healing wound. Neurological Exam: Mental status: alert and oriented to person, place, date, states year is 2016.  No dysarthria or aphasia, Fund of knowledge is appropriate.  Recent and remote memory are intact.  Attention and concentration are normal.    Able to name objects and repeat phrases. Cranial nerves: CN I: not tested CN II: pupils equal, round and reactive to light, visual fields intact, fundi unremarkable. CN III, IV, VI:  full range of motion, no nystagmus, no ptosis CN V: facial sensation intact CN VII: shallow right nasolabial fold with symmetric smile CN VIII: hearing intact to finger rub CN IX, X: gag intact, uvula midline CN XI: sternocleidomastoid and trapezius muscles intact CN XII: tongue midline Bulk &  Tone: normal, no fasciculations. Motor: 4/5 proximal right UE, 5/5 distal right UE. 5/5 on left UE. She is s/p amputation below knees bilaterally, at least 3/5 hip flexion Sensation: intact to light touch, cold, pin, vibration and joint position sense.  No extinction to double simultaneous stimulation.   Deep Tendon Reflexes: +2 on both UE, unable to elicit in both knees Plantar responses: not tested Cerebellar: no incoordination on finger to nose testing Gait: not tested Tremor: none  IMPRESSION: This is a 58 year old right-handed woman with a history of  diabetes, hypertension, hyperlipidemia, peripheral vascular disease s/p right carotid endarterectomy, CAD s/p CABG, right BKA, nocturnal convulsion in 2009. She underwent left transtibial amputation on 08/22/13 then had a seizure on 06/11.  MRI brain showed a lesion in the left frontal and parietal white matter, concerning for vasogenic edema. LP unremarkable, including cytology.  Considerations include primary vs metastatic tumor, infection (viral, PML).  She was noted to have hypertensive emergency with the first seizure, although asymmetric and more frontal, atypical PRES is another consideration. Repeat studies have been degraded by motion. She will benefit from repeating the MRI brain with and without contrast to further delineate this lesion, and will take PO sedation prior to open MRI.  Brain biopsy will be considered if unrevealing.  She has been drowsy and irritable on the Keppra, and will start taking dose at bedtime, continue Keppra XR 1500mg  qhs.  Seizure precautions and triggers were discussed with the patient and her son.  She will follow-up in 1 month.  Thank you for allowing me to participate in the care of this patient. Please do not hesitate to call for any questions or concerns.   Ellouise Newer, M.D.

## 2013-09-14 NOTE — Patient Instructions (Addendum)
1. MRI brain with and without contrast 2. Take Valium 2.5mg  1 tab prior to MRI, may take a second dose if needed 3. Continue Keppra XR 1000mg  at bedtime 4. Follow-up in 1 month  Seizure Precautions: 1. If medication has been prescribed for you to prevent seizures, take it exactly as directed.  Do not stop taking the medicine without talking to your doctor first, even if you have not had a seizure in a long time.   2. Avoid activities in which a seizure would cause danger to yourself or to others.  Don't operate dangerous machinery, swim alone, or climb in high or dangerous places, such as on ladders, roofs, or girders.  Do not drive unless your doctor says you may.  3. If you have any warning that you may have a seizure, lay down in a safe place where you can't hurt yourself.    4.  No driving for 6 months from last seizure, as per [facility].   Please refer to the following link on the [place] website for more information: http://[email or web address]/[email or web address]/drivingu.cfm   5.  Maintain good sleep hygiene.  6.  Contact your doctor if you have any problems that may be related to the medicine you are taking.  7.  Call 911 and bring the patient back to the ED if:        A.  The seizure lasts longer than 5 minutes.       B.  The patient doesn't awaken shortly after the seizure  C.  The patient has new problems such as difficulty seeing, speaking or moving  D.  The patient was injured during the seizure  E.  The patient has a temperature over 102 F (39C)  F.  The patient vomited and now is having trouble breathing

## 2013-09-20 ENCOUNTER — Encounter: Payer: Self-pay | Admitting: Neurology

## 2013-09-28 ENCOUNTER — Inpatient Hospital Stay: Admission: RE | Admit: 2013-09-28 | Payer: Medicare Other | Source: Ambulatory Visit

## 2013-10-12 ENCOUNTER — Other Ambulatory Visit: Payer: Self-pay | Admitting: Neurosurgery

## 2013-10-17 ENCOUNTER — Other Ambulatory Visit: Payer: Self-pay | Admitting: Neurosurgery

## 2013-10-17 DIAGNOSIS — G9389 Other specified disorders of brain: Secondary | ICD-10-CM

## 2013-10-28 ENCOUNTER — Encounter: Payer: Self-pay | Admitting: Neurology

## 2013-10-28 ENCOUNTER — Ambulatory Visit (INDEPENDENT_AMBULATORY_CARE_PROVIDER_SITE_OTHER): Payer: Medicare Other | Admitting: Neurology

## 2013-10-28 VITALS — BP 122/70 | HR 86 | Ht 65.0 in | Wt 140.0 lb

## 2013-10-28 DIAGNOSIS — G9389 Other specified disorders of brain: Secondary | ICD-10-CM

## 2013-10-28 DIAGNOSIS — I25810 Atherosclerosis of coronary artery bypass graft(s) without angina pectoris: Secondary | ICD-10-CM

## 2013-10-28 DIAGNOSIS — G40219 Localization-related (focal) (partial) symptomatic epilepsy and epileptic syndromes with complex partial seizures, intractable, without status epilepticus: Secondary | ICD-10-CM

## 2013-10-28 DIAGNOSIS — G939 Disorder of brain, unspecified: Secondary | ICD-10-CM

## 2013-10-28 MED ORDER — DIAZEPAM 2 MG PO TABS: ORAL_TABLET | ORAL | Status: DC

## 2013-10-28 NOTE — Progress Notes (Signed)
NEUROLOGY FOLLOW UP OFFICE NOTE  [name] JT:[id number]  HISTORY OF PRESENT ILLNESS: I had the pleasure of seeing [name] in follow-up in the neurology clinic on 10/28/2013.  The patient was last seen on 6 weeks ago and is again accompanied by her son today. She has not yet had MRI brain done.  She was admitted for new onset seizures last 08/29/2013, found to have an abnormal MRI brain with increased signal in the left frontal region.  CSF studies unremarkable. She was started on Keppra and had side effects of drowsiness, switched to evening dosing which has helped somewhat but she still feels tired. She has missed a few appointments because she would oversleep.  Her son denies any further seizures since her last visit. She is almost 100% better with the confusion, however she does note some memory problems recalling dates and having word-finding difficulties.    HPI:  This is a 58 yo RH woman with a history of diabetes, hypertension, hyperlipidemia, peripheral vascular disease s/p right carotid endarterectomy, CAD s/p CABG, and right BKA secondary to diabetes. She was admitted for left foot osteomyelitis due to poor healing wound after a fall last winter. She underwent left transtibial amputation on 08/22/13. She was discharged home on 06/11, then started having altered mental status on 06/15. Her son reports she had hit her head in the left frontal region when getting into their car, then started acting drowsy and out of it the next day. That evening, they were getting ready to eat when her right arm flexed and clenched into her chest followed by head turn to the left,stiffening and shaking for 1 minute as she fell to her left side. She was confused for 10 minutes, back to baseline within 30 minutes. She was brought to [facility] ER where BP was noted to be 205/87. She was given IV labetalol with slow improvement in BP. Blood sugar noted to be 303. Head CT showed low attenuation in the left corona  radiata and centrum semiovale. She had an MRI brain initially without contrast which showed increased T2 and FLAIR signal in the left hemisphere from the anterior frontal cortex, supplemental motor area, extending down to the posterior limb of the internal capsule. She became agitated and needed sedation for MRI brain with contrast, however again became restless with motion degradation, no abnormal enhancement seen. Left ICA flow void was noted to be absent, similar since 2009. It was felt that symptoms were not consistent with left MCA ischemia and concern was for viral CNS infection and received 4 days of empiric IV Acyclovir. She underwent a lumbar puncture which showed WBC 1, RBC 17, glucose 82, protein 29, VDRL, CMV, HSV, arbovirus, gram stain and culture negative. No malignant cells on cytology. She was started on Keppra with no further seizures, discharged home on 06/18. She was apparently intermittently confused at home, woke up better then next day, then had another seizure that morning. Her son today reports she had a generalized convulsion with head deviation to left for 5 minutes. On arrival to the ER, per records, she was poorly responsive with right gaze deviation, nystagmoid eye movements. BP 197/80 She was given IV Ativan, according to her son she remained in this state for an hour, then was unresponsive for 36 hours after. Prolonged EEG showed diffuse slowing, no electrographic seizures or epileptiform discharges. Repeat MRI brain with and without contrast was again degraded by motion. Similar abnormal signal in the left frontal and parietal lobes  again noted, predominantly in the centrum semiovale extending into the subcortical white matter. There is an area of questionable thickening in the left frontal cortex noted to be unchanged, no effacement of sulci in this area, no restricted diffusion, no abnormal enhancement although study is suboptimal. Chronically occluded left ICA again noted. She was  discharged home on Keppra XR 1500mg /day.   Of note, in 2009, she had a nocturnal convulsion and was admitted to [facility]. Records unavailable for review, according to her son she was not started on seizure medication due to first seizure. She had an MRI brain at that time which I personally reviewed, it was noted to be limited by motion and patient refusing to complete study, however on FLAIR sequences, white matter changes in the left hemisphere are not present.   Epilepsy Risk Factors: Left frontal white matter lesion. Otherwise she had a normal birth and early development. There is no history of febrile convulsions, significant traumatic brain injury, neurosurgical procedures, or family history of seizures.   PAST MEDICAL HISTORY: Past Medical History  Diagnosis Date  . Hypertension   . Neuropathy   . Peripheral vascular disease   . Coronary artery disease   . Anxiety   . Depression   . Anemia   . Type II diabetes mellitus   . High cholesterol   . History of blood transfusion     "one time; related to diabetes I guess"   . Arthritis     "hands"  . GERD (gastroesophageal reflux disease)     takes Zantac  . Complication of anesthesia     pt felt like she had a hard time waking up after surgery in Feb. 2015.  Marland Kitchen CHF (congestive heart failure)   . Seizures     MEDICATIONS: Current Outpatient Prescriptions on File Prior to Visit  Medication Sig Dispense Refill  . amitriptyline (ELAVIL) 25 MG tablet Take 25 mg by mouth at bedtime as needed for sleep.       Marland Kitchen aspirin 81 MG tablet Take 81 mg by mouth daily.        Marland Kitchen atorvastatin (LIPITOR) 80 MG tablet Take 80 mg by mouth daily.        . clopidogrel (PLAVIX) 75 MG tablet Take 75 mg by mouth daily.       . insulin NPH Human (HUMULIN N,NOVOLIN N) 100 UNIT/ML injection Inject 25-35 Units into the skin 2 (two) times daily before a meal. 35 units in the morning and 25 units in the evening      . Levetiracetam (KEPPRA XR) 750 MG TB24 Take 2  tablets (1,500 mg total) by mouth daily.  120 tablet  0  . metFORMIN (GLUCOPHAGE) 500 MG tablet Take 1 tablet (500 mg total) by mouth 4 (four) times daily.      . metoprolol tartrate (LOPRESSOR) 50 MG tablet Take 0.5 tablets (25 mg total) by mouth 2 (two) times daily.  60 tablet  0  . Multiple Vitamins-Minerals (MULTIVITAMINS THER. W/MINERALS) TABS Take 1 tablet by mouth daily.        Marland Kitchen oxyCODONE-acetaminophen (ROXICET) 5-325 MG per tablet Take 1 tablet by mouth every 4 (four) hours as needed for severe pain.  60 tablet  0  . quinapril (ACCUPRIL) 40 MG tablet Take 40 mg by mouth daily.       . ranitidine (ZANTAC) 150 MG tablet Take 150 mg by mouth 2 (two) times daily.       . sertraline (ZOLOFT) 100 MG tablet Take  100 mg by mouth 2 (two) times daily.        No current facility-administered medications on file prior to visit.    ALLERGIES: Allergies  Allergen Reactions  . Tape Rash    Redness, rash, itchiness    FAMILY HISTORY: Family History  Problem Relation Age of Onset  . Cancer Mother   . Heart disease Father   . Diabetes Father   . Hyperlipidemia Father   . Hypertension Father     SOCIAL HISTORY: History   Social History  . Marital Status: Married    Spouse Name: N/A    Number of Children: N/A  . Years of Education: N/A   Occupational History  . Not on file.   Social History Main Topics  . Smoking status: Never Smoker   . Smokeless tobacco: Never Used  . Alcohol Use: No  . Drug Use: No  . Sexual Activity: Not Currently   Other Topics Concern  . Not on file   Social History Narrative  . No narrative on file    REVIEW OF SYSTEMS: Constitutional: No fevers, chills, or sweats, no generalized fatigue, change in appetite Eyes: No visual changes, double vision, eye pain Ear, nose and throat: No hearing loss, ear pain, nasal congestion, sore throat Cardiovascular: No chest pain, palpitations Respiratory:  No shortness of breath at rest or with exertion,  wheezes GastrointestinaI: No nausea, vomiting, diarrhea, abdominal pain, fecal incontinence Genitourinary:  No dysuria, urinary retention or frequency Musculoskeletal:  No neck pain, back pain Integumentary: No rash, pruritus, skin lesions Neurological: as above Psychiatric: No depression, insomnia, anxiety Endocrine: No palpitations, fatigue, diaphoresis, mood swings, change in appetite, change in weight, increased thirst Hematologic/Lymphatic:  No anemia, purpura, petechiae. Allergic/Immunologic: no itchy/runny eyes, nasal congestion, recent allergic reactions, rashes  PHYSICAL EXAM: Filed Vitals:   10/28/13 1358  BP: 122/70  Pulse: 86   General: No acute distress Head:  Normocephalic/atraumatic Neck: supple, no paraspinal tenderness, full range of motion Heart:  Regular rate and rhythm Lungs:  Clear to auscultation bilaterally Back: No paraspinal tenderness Skin/Extremities: No rash, no edema, bilateral BKA Neurological Exam:alert and oriented to person, place, time. No dysarthria or aphasia, Fund of knowledge is appropriate. 2/3 delayed recall. Remote memory are intact. Attention and concentration are normal. Able to name objects and repeat phrases.  Cranial nerves:  CN I: not tested  CN II: pupils equal, round and reactive to light, visual fields intact, fundi unremarkable.  CN III, IV, VI: full range of motion, no nystagmus, no ptosis  CN V: facial sensation intact  CN VII: no facial asymmetry CN VIII: hearing intact to finger rub  CN IX, X: gag intact, uvula midline  CN XI: sternocleidomastoid and trapezius muscles intact  CN XII: tongue midline  Bulk & Tone: normal, no fasciculations.  Motor: 5/5 on both UE and both proximal LE, she is s/p amputation below knees bilaterally Sensation: intact to light touch. No extinction to double simultaneous stimulation.  Deep Tendon Reflexes: +2 on both UE, unable to elicit in both knees  Plantar responses: not tested  Cerebellar:  no incoordination on finger to nose testing  Gait: not tested  Tremor: none   IMPRESSION:  This is a 58 yo RH woman with a history of diabetes, hypertension, hyperlipidemia, peripheral vascular disease s/p right carotid endarterectomy, CAD s/p CABG, right BKA, nocturnal convulsion in 2009. She underwent left transtibial amputation on 08/22/13 then had a seizure on 06/11. MRI brain showed a lesion in the left  frontal and parietal white matter, concerning for vasogenic edema. LP unremarkable, including cytology. Considerations include primary vs metastatic tumor, infection (viral, PML). She was noted to have hypertensive emergency with the first seizure, although asymmetric and more frontal, atypical PRES is another consideration. Repeat studies have been degraded by motion. She is scheduled for repeat MRI next week.  No further seizures since end of June.  She will continue Keppra XR 1500mg  qhs for now.  She does endorse drowsiness and fatigue, consideration for switching to Lamictal will be done after MRI, we discussed slow uptitration and bridging with Keppra until therapeutic, we discussed side effects including Kathreen Cosier syndrome.  She will follow-up in 3 months.  Thank you for allowing me to participate in her care.  Please do not hesitate to call for any questions or concerns.  The duration of this appointment visit was 15 minutes of face-to-face time with the patient.  Greater than 50% of this time was spent in counseling, explanation of diagnosis, planning of further management, and coordination of care.   Ellouise Newer, M.D.

## 2013-10-28 NOTE — Patient Instructions (Addendum)
1. Continue Keppra XR 1500mg  at bedtime 2. Proceed with MRI brain with and without contrast 3. May use Valium 2mg  1/2 tab prior to MRI, and take second 1/2 tab if needed 4. Follow-up in 3 months

## 2013-10-30 ENCOUNTER — Encounter: Payer: Self-pay | Admitting: Neurology

## 2013-10-30 DIAGNOSIS — G40219 Localization-related (focal) (partial) symptomatic epilepsy and epileptic syndromes with complex partial seizures, intractable, without status epilepticus: Secondary | ICD-10-CM | POA: Insufficient documentation

## 2013-10-30 DIAGNOSIS — G9389 Other specified disorders of brain: Secondary | ICD-10-CM | POA: Insufficient documentation

## 2013-10-31 ENCOUNTER — Ambulatory Visit
Admission: RE | Admit: 2013-10-31 | Discharge: 2013-10-31 | Disposition: A | Discharge status: Home / Self Care | Payer: Medicare Other | Source: Ambulatory Visit | Attending: Neurosurgery | Admitting: Neurosurgery

## 2013-10-31 DIAGNOSIS — G9389 Other specified disorders of brain: Secondary | ICD-10-CM

## 2013-10-31 MED ORDER — GADOBENATE DIMEGLUMINE 529 MG/ML IV SOLN
7.0000 mL | Freq: Once | INTRAVENOUS | Status: AC | PRN
  Administered 2013-10-31: 7 mL via INTRAVENOUS

## 2013-11-01 NOTE — Progress Notes (Signed)
Done

## 2013-11-02 ENCOUNTER — Telehealth: Payer: Self-pay | Admitting: Neurology

## 2013-11-02 NOTE — Telephone Encounter (Signed)
Pt called f/u on the results on the MRI she had done. C/B [phone]

## 2013-11-02 NOTE — Telephone Encounter (Signed)
Returned call. Results not in for review yet, will call her once Dr. Delice Lesch receives results to review.

## 2013-11-07 ENCOUNTER — Telehealth: Payer: Self-pay | Admitting: Neurology

## 2013-11-07 NOTE — Telephone Encounter (Signed)
Have you reviewed MRI? 

## 2013-11-07 NOTE — Telephone Encounter (Signed)
Discussed MRI findings with patient, MRI looks great, complete resolution of signal changes seen on the left hemisphere on prior scan. She should continue on seizure medication at this time and knows to call our office for any changes.

## 2013-11-07 NOTE — Telephone Encounter (Signed)
[phone] pt  wants the result MRI  dana

## 2014-01-24 ENCOUNTER — Ambulatory Visit: Payer: Medicare Other | Admitting: Physical Therapy

## 2014-01-26 ENCOUNTER — Ambulatory Visit: Payer: Medicare Other | Attending: Orthopedic Surgery | Admitting: Physical Therapy

## 2014-01-26 DIAGNOSIS — R269 Unspecified abnormalities of gait and mobility: Secondary | ICD-10-CM | POA: Insufficient documentation

## 2014-01-26 DIAGNOSIS — R6889 Other general symptoms and signs: Secondary | ICD-10-CM | POA: Insufficient documentation

## 2014-01-26 DIAGNOSIS — Z89511 Acquired absence of right leg below knee: Secondary | ICD-10-CM | POA: Insufficient documentation

## 2014-01-26 DIAGNOSIS — Z89512 Acquired absence of left leg below knee: Secondary | ICD-10-CM | POA: Insufficient documentation

## 2014-02-01 ENCOUNTER — Ambulatory Visit: Payer: Medicare Other | Admitting: Physical Therapy

## 2014-02-01 ENCOUNTER — Ambulatory Visit: Payer: Medicare Other | Admitting: Neurology

## 2014-02-01 ENCOUNTER — Encounter: Payer: Self-pay | Admitting: Physical Therapy

## 2014-02-01 ENCOUNTER — Telehealth: Payer: Self-pay | Admitting: Neurology

## 2014-02-01 DIAGNOSIS — R269 Unspecified abnormalities of gait and mobility: Secondary | ICD-10-CM

## 2014-02-01 DIAGNOSIS — Z89511 Acquired absence of right leg below knee: Secondary | ICD-10-CM

## 2014-02-01 DIAGNOSIS — R6889 Other general symptoms and signs: Secondary | ICD-10-CM | POA: Diagnosis not present

## 2014-02-01 DIAGNOSIS — Z89512 Acquired absence of left leg below knee: Secondary | ICD-10-CM

## 2014-02-01 NOTE — Telephone Encounter (Signed)
Noted  

## 2014-02-01 NOTE — Telephone Encounter (Signed)
Pt called at 10:18AM to r/s her f/u appt for today 02/01/14 to 03/20/14. C/B [phone]

## 2014-02-01 NOTE — Therapy (Signed)
Physical Therapy Treatment  Patient Details  Name: [name] MRN: [id number] Date of Birth: 1955/09/03  Encounter Date: 02/01/2014      PT End of Session - 02/01/14 0853    Visit Number 1   Number of Visits 9   Date for PT Re-Evaluation 04/03/14   PT Start Time 0808   PT Stop Time 0853   PT Time Calculation (min) 45 min   Equipment Utilized During Treatment Gait belt   Activity Tolerance Patient tolerated treatment well   Behavior During Therapy [facility] for tasks assessed/performed      Past Medical History  Diagnosis Date  . Hypertension   . Neuropathy   . Peripheral vascular disease   . Coronary artery disease   . Anxiety   . Depression   . Anemia   . Type II diabetes mellitus   . High cholesterol   . History of blood transfusion     "one time; related to diabetes I guess"   . Arthritis     "hands"  . GERD (gastroesophageal reflux disease)     takes Zantac  . Complication of anesthesia     pt felt like she had a hard time waking up after surgery in Feb. 2015.  Marland Kitchen CHF (congestive heart failure)   . Seizures     Past Surgical History  Procedure Laterality Date  . Toe amputation Right     "took off a couple toes before the amputation"  . Below knee leg amputation Right Jan. 2008  . Carotid endarterectomy Right   . Cholecystectomy    . Ankle fusion Left 05/11/2013    TIBIOCALCANEAL FUSION   . Tonsillectomy    . Dilation and curettage of uterus    . Coronary artery bypass graft  12/01/2007    "CABG X4" (05/12/2013)  . Ankle fusion Left 05/12/2013    Procedure: LEFT TIBIOCALCANEAL FUSION;  Surgeon: Newt Minion, MD;  Location: [place];  Service: Orthopedics;  Laterality: Left;  Left Tibiocalcaneal Fusion  . Colonoscopy    . Irrigation and debridement abscess Left 06/17/2013    ANKLE           DR [name]  . I&d extremity Left 06/17/2013    Procedure: IRRIGATION AND DEBRIDEMENT EXTREMITY, PLACEMENT ANTIBIOTIC STIMULAN BEADS;  Surgeon: Newt Minion, MD;  Location: [place];  Service: Orthopedics;  Laterality: Left;  . Application of wound vac  06/17/2013    Procedure: APPLICATION OF WOUND VAC;  Surgeon: Newt Minion, MD;  Location: [place];  Service: Orthopedics;;  . Amputation Left 08/22/2013    Procedure: LEFT BELOW KNEE AMPUTATION;  Surgeon: Newt Minion, MD;  Location: [place];  Service: Orthopedics;  Laterality: Left;    There were no vitals taken for this visit.  Visit Diagnosis:  Activity intolerance - Plan: PT plan of care cert/re-cert  Abnormality of gait - Plan: PT plan of care cert/re-cert  Status post bilateral below knee amputation - Plan: PT plan of care cert/re-cert      Subjective Assessment - 02/01/14 0815    Symptoms This 58yo female underwent a left Transtibial Amputation on 08/22/13 making her have bilateral Transtibial Amputation. She recieved a left prosthesis ~2 months ago. She presents for PT prosthetic evaluation & training.   Patient Stated Goals She wants to ambulate safely with prostheses in home & community.    Currently in Pain? Yes   Pain Score 9    Pain Location Foot   Pain Type  Phantom pain   Pain Onset More than a month ago   Aggravating Factors  worse at night   Pain Relieving Factors medication alleviate pain   Effect of Pain on Daily Activities Without meds she has trouble falling asleep. PT will monitor but not actively treat.   Multiple Pain Sites No          [facility] PT Assessment - 02/01/14 0800    Assessment   Medical Diagnosis Bilateral Transtibial Amputations   Onset Date 12/29/13   Precautions   Precautions Fall   Required Braces or Orthoses --  Bilateral TTA prostheses   Restrictions   Weight Bearing Restrictions No   Balance Screen   Has the patient fallen in the past 6 months No   Has the patient had a decrease in activity level because of a fear of falling?  Yes   Is the patient reluctant to leave their home because of a fear of falling?  No   Home Environment   Living Enviornment Private  residence   Living Arrangements Spouse/significant other;Children  1yo son in home & 73yo son at college (45 min away)   Type of [place] entrance;Stairs to enter   Entrance Stairs-Number of Steps 8   Entrance Stairs-Rails Can reach both   [place] One level   Prior Function   Level of Independence Independent with basic ADLs;Independent with homemaking with ambulation;Independent with gait;Independent with transfers   Vocation On disability   Cognition   Overall Cognitive Status Within Functional Limits for tasks assessed   Observation/Other Assessments   Focus on Therapeutic Outcomes (FOTO)  35  Functional Status score   Posture/Postural Control   Posture/Postural Control Postural limitations   Postural Limitations Rounded Shoulders;Forward head;Flexed trunk;Weight shift right   AROM   Overall AROM  Within functional limits for tasks performed;Other (comment)  But appears tightness in hip flexors   Strength   Overall Strength Within functional limits for tasks performed;Other (comment)  Tested in sitting   Transfers   Transfers Sit to Stand;Stand to Sit   Ambulation/Gait   Assistive device Rolling walker;Other (Comment)  Bilateral TTA prostheses   Gait Pattern Step-through pattern;Decreased step length - right;Decreased stance time - left;Trunk flexed;Poor foot clearance - left;Left hip hike;Abducted - left  decreased gait speed   Gait velocity 0.93 ft/sec  indicates fall risk <1.8 ft/sec, household ambulator <1.32   Static Standing Balance   Static Standing - Balance Support No upper extremity supported   Static Standing - Level of Assistance 4: Min assist;2: Max assist;Other (comment)  max A to prevent fall to right   Static Standing - Comment/# of Minutes 50 sec after multi attempts, decreased reaction time with balance loss  Required max A to prevent fall   Dynamic Standing Balance   Dynamic Standing - Balance Support Left upper extremity  supported  On Rolling Walker   Dynamic Standing - Level of Assistance 4: Min assist   Dynamic Standing - Balance Activities Reaching for objects;Other/ Comments  Forward 6.5" & to floor   Dynamic Standing - Comments Excessive UE WB on RW to maintain balance & reaching          [facility] Adult PT Treatment/Exercise - 02/01/14 0800    Transfers   Sit to Stand 4: Min assist;With upper extremity assist;With armrests;Other/comment  from wheel chair; assist for balance and safety   Stand to Sit 4: Min assist;With upper extremity assist;With armrests;Other (comment)  to wheelchair;  min asst for balance and safety   Ambulation/Gait   Ambulation/Gait Yes   Ambulation/Gait Assistance 4: Min assist   Ambulation/Gait Assistance Details assistance for balance and safety   Ambulation Distance (Feet) 100 Feet   Balance   Balance Assessed Yes   Static Standing Balance   Static Standing - Balance Support --   Static Standing - Level of Assistance --   Static Standing - Comment/# of Minutes --   Dynamic Standing Balance   Dynamic Standing - Balance Support --   Dynamic Standing - Level of Assistance --   Dynamic Standing - Balance Activities --   Dynamic Standing - Comments --          PT Education - 02/01/14 0850    Education provided Yes   Education Details See prosthetic care, UE support required for all standing for safety either on walker or counter.   Person(s) Educated Patient;Spouse   Methods Explanation   Comprehension Verbalized understanding          PT Short Term Goals - 02/01/14 1332    PT SHORT TERM GOAL #1   Title verbalizes proper residual limb care with prostheses (Target Date: 03/03/14)   Time 4   Period Weeks   Status New   PT SHORT TERM GOAL #2   Title performs standing balance with walker support reaching >10" & managing clothes modified independent.  (Target Date: 03/03/14)   Time 4   Period Weeks   Status New   PT SHORT TERM GOAL #3   Title ambulates 300'  with rolling walker & prostheses with supervision.  (Target Date: 03/03/14)   Time 4   Period Weeks   Status New   PT SHORT TERM GOAL #4   Title negotiate ramp, curb & stairs (2 rails) with rolling walker & prostheses with supervision.  (Target Date: 03/03/14)   Time 4   Period Weeks   Status New          PT Long Term Goals - 02/01/14 1346    PT LONG TERM GOAL #1   Title Patient is independent with all aspects of prosthetic care.  (Target Date: 04/01/14)   Time 8   Period Weeks   Status New   PT LONG TERM GOAL #2   Title Patient tolerates wear of bilateral prostheses >90% of awake hours with skin issues or tenderness.  (Target Date: 04/01/14)   Time 8   Period Weeks   Status New   PT LONG TERM GOAL #3   Title patient ambulates 500' with LRAD & prostheses with family supervision.  (Target Date: 04/01/14)   PT LONG TERM GOAL #4   Title patient ambulates around household furniture carrrying plate /cup with LRAD & prostheses 100'  modified independent.  (Target Date: 04/01/14)   Time 8   Period Weeks   Status New   PT LONG TERM GOAL #5   Title Patient negotiates ramp, curb & stairs with LRAD & prostheses with family supervision.  (Target Date: 04/01/14)   Time 8   Period Weeks   Status New   Additional Long Term Goals   Additional Long Term Goals Yes   PT LONG TERM GOAL #6   Title standing balance: without UE support maintains upright 60 seconds safely; reaches 10", to floor & in upper/lower cabinets with light UE support modified independent.  (Target Date: 04/01/14)   Time 8   Period Weeks   Status New  Plan - February 02, 2014 1323    Clinical Impression Statement This 58yo female was functional with one prosthesis and no assistive device in community. She now has second amputation on other leg with bilateral prostheses. She needs skilled intervention of PT to function at community level with bilateral prostheses. She is dependent in use/care with bilateral prostheses and  has high fall risk.    Pt will benefit from skilled therapeutic intervention in order to improve on the following deficits Abnormal gait;Decreased activity tolerance;Decreased balance;Decreased knowledge of use of DME;Decreased mobility;Decreased strength;Other (comment)  Prosthetic dependency   Rehab Potential Good   PT Frequency 1x / week   PT Duration 8 weeks   PT Treatment/Interventions ADLs/Self Care Home Management;DME Instruction;Gait training;Stair training;Functional mobility training;Therapeutic activities;Therapeutic exercise;Balance training;Neuromuscular re-education;Patient/family education;Other (comment)  prosthetic training   PT Next Visit Plan HEP for balance, gait with walker on barriers   PT Home Exercise Plan balance standing with walker   Consulted and Agree with Plan of Care Patient;Family member/caregiver          G-Codes - 02-02-2014 1353    Functional Assessment Tool Used Balance standing with walker support / prostheses reaches 6.5" with minimal assist. Ambulates 100' with RW with minimal assist.   Functional Limitation Mobility: Walking and moving around   Mobility: Walking and Moving Around Current Status [phone]) At least 80 percent but less than 100 percent impaired, limited or restricted   Mobility: Walking and Moving Around Goal Status [phone]) At least 40 percent but less than 60 percent impaired, limited or restricted      Problem List Patient Active Problem List   Diagnosis Date Noted  . Localization-related (focal) (partial) epilepsy and epileptic syndromes with complex partial seizures, with intractable epilepsy 10/30/2013  . Left frontal lobe mass 10/30/2013  . Status epilepticus 09/02/2013  . Acute encephalopathy 09/02/2013  . Seizure 08/29/2013  . Accelerated hypertension 08/29/2013  . Osteomyelitis 08/21/2013  . Atherosclerosis of native arteries of the extremities with ulceration(440.23) 07/14/2013  . Protein-calorie malnutrition, severe  06/20/2013  . Osteomyelitis of ankle or foot, left, acute 06/17/2013  . Charcot foot due to diabetes mellitus 05/13/2013  . PVD (peripheral vascular disease) 03/25/2012  . Atherosclerosis of native arteries of the extremities with intermittent claudication 09/16/2011  . DIABETES MELLITUS 03/14/2008  . HYPERLIPIDEMIA 03/14/2008  . ANEMIA OF CHRONIC DISEASE 03/14/2008  . ANXIETY DEPRESSION 03/14/2008  . HYPERTENSION, BENIGN 03/14/2008  . CAD, ARTERY BYPASS GRAFT 03/14/2008  . CAROTID STENOSIS 03/14/2008  . PVD 03/14/2008  . BRUIT 03/14/2008  . SEIZURES, HX OF 03/14/2008  . CAROTID ENDARTERECTOMY, RIGHT, HX OF 03/14/2008  . CHOLECYSTECTOMY, LAPAROSCOPIC, HX OF 03/14/2008          Prosthetics Assessment - Feb 02, 2014 0800    Prosthetic Care Independent with Skin check;Prosthetic cleaning;Ply sock cleaning;Proper wear schedule/adjustment   Prosthetic Care Dependent with Residual limb care;Correct ply sock adjustment   Prosthetic Care Comments  use lotion at night, wipe lotion off before donning prostheses   Current prosthetic wear tolerance (days/week)  7 days/wk   Current prosthetic wear tolerance (#hours/day)  all awake hours for both prostheses   Residual limb condition  no open areas, callous at right distal tibia, lotion residual      Jamey Reas, PT, DPT Physical Therapist Specializing in Prosthetics & Limb Loss Care Phone: [phone] FAX: [phone] [facility] [address] [place], [place] [address]   Jamey Reas Feb 02, 2014, 2:05 PM

## 2014-02-02 NOTE — Telephone Encounter (Signed)
Marked as no shoed b/c pt waited until the day of to r/s appt. A no show letter will not be sent / Sherri S.

## 2014-02-14 ENCOUNTER — Ambulatory Visit: Payer: Medicare Other | Admitting: Physical Therapy

## 2014-02-16 ENCOUNTER — Encounter: Payer: Medicare Other | Admitting: Physical Therapy

## 2014-02-17 ENCOUNTER — Encounter: Payer: Medicare Other | Admitting: Physical Therapy

## 2014-02-21 ENCOUNTER — Ambulatory Visit: Payer: Medicare Other | Attending: Orthopedic Surgery | Admitting: Physical Therapy

## 2014-02-21 DIAGNOSIS — Z89512 Acquired absence of left leg below knee: Secondary | ICD-10-CM | POA: Insufficient documentation

## 2014-02-21 DIAGNOSIS — R269 Unspecified abnormalities of gait and mobility: Secondary | ICD-10-CM | POA: Insufficient documentation

## 2014-02-21 DIAGNOSIS — Z89511 Acquired absence of right leg below knee: Secondary | ICD-10-CM | POA: Insufficient documentation

## 2014-02-21 DIAGNOSIS — R6889 Other general symptoms and signs: Secondary | ICD-10-CM | POA: Insufficient documentation

## 2014-02-28 ENCOUNTER — Encounter: Payer: Medicare Other | Admitting: Physical Therapy

## 2014-03-01 ENCOUNTER — Ambulatory Visit: Payer: Medicare Other | Admitting: Physical Therapy

## 2014-03-07 ENCOUNTER — Ambulatory Visit: Payer: Medicare Other | Admitting: Physical Therapy

## 2014-03-07 ENCOUNTER — Encounter: Payer: Self-pay | Admitting: Physical Therapy

## 2014-03-07 DIAGNOSIS — Z89512 Acquired absence of left leg below knee: Secondary | ICD-10-CM

## 2014-03-07 DIAGNOSIS — R6889 Other general symptoms and signs: Secondary | ICD-10-CM | POA: Diagnosis not present

## 2014-03-07 DIAGNOSIS — R269 Unspecified abnormalities of gait and mobility: Secondary | ICD-10-CM | POA: Diagnosis not present

## 2014-03-07 DIAGNOSIS — Z89511 Acquired absence of right leg below knee: Secondary | ICD-10-CM | POA: Diagnosis not present

## 2014-03-07 NOTE — Therapy (Signed)
[facility] [address] [place], [place], [address] Phone: [phone]   Fax:  [phone]  Physical Therapy Treatment  Patient Details  Name: [name] MRN: [id number] Date of Birth: 09-01-1955  Encounter Date: 03/07/2014      PT End of Session - 03/07/14 1445    Visit Number 2   Number of Visits 9   Date for PT Re-Evaluation 04/03/14   PT Start Time 1450   PT Stop Time 1530   PT Time Calculation (min) 40 min   Equipment Utilized During Treatment Gait belt   Activity Tolerance Patient tolerated treatment well   Behavior During Therapy [facility] for tasks assessed/performed      Past Medical History  Diagnosis Date  . Hypertension   . Neuropathy   . Peripheral vascular disease   . Coronary artery disease   . Anxiety   . Depression   . Anemia   . Type II diabetes mellitus   . High cholesterol   . History of blood transfusion     "one time; related to diabetes I guess"   . Arthritis     "hands"  . GERD (gastroesophageal reflux disease)     takes Zantac  . Complication of anesthesia     pt felt like she had a hard time waking up after surgery in Feb. 2015.  Marland Kitchen CHF (congestive heart failure)   . Seizures     Past Surgical History  Procedure Laterality Date  . Toe amputation Right     "took off a couple toes before the amputation"  . Below knee leg amputation Right Jan. 2008  . Carotid endarterectomy Right   . Cholecystectomy    . Ankle fusion Left 05/11/2013    TIBIOCALCANEAL FUSION   . Tonsillectomy    . Dilation and curettage of uterus    . Coronary artery bypass graft  12/01/2007    "CABG X4" (05/12/2013)  . Ankle fusion Left 05/12/2013    Procedure: LEFT TIBIOCALCANEAL FUSION;  Surgeon: Newt Minion, MD;  Location: [place];  Service: Orthopedics;  Laterality: Left;  Left Tibiocalcaneal Fusion  . Colonoscopy    . Irrigation and debridement abscess Left 06/17/2013    ANKLE           DR [name]  .  I&d extremity Left 06/17/2013    Procedure: IRRIGATION AND DEBRIDEMENT EXTREMITY, PLACEMENT ANTIBIOTIC STIMULAN BEADS;  Surgeon: Newt Minion, MD;  Location: [place];  Service: Orthopedics;  Laterality: Left;  . Application of wound vac  06/17/2013    Procedure: APPLICATION OF WOUND VAC;  Surgeon: Newt Minion, MD;  Location: [place];  Service: Orthopedics;;  . Amputation Left 08/22/2013    Procedure: LEFT BELOW KNEE AMPUTATION;  Surgeon: Newt Minion, MD;  Location: [place];  Service: Orthopedics;  Laterality: Left;    There were no vitals taken for this visit.  Visit Diagnosis:  Activity intolerance  Abnormality of gait  Status post bilateral below knee amputation      Subjective Assessment - 03/07/14 1445    Symptoms Fell backwards on buttocks that are now sore. She was uncertain how she fell when questioned.    Currently in Pain? Yes   Pain Score 5    Pain Location Buttocks   Pain Orientation Mid   Pain Descriptors / Indicators Sore   Pain Type Acute pain   Pain Onset In the past 7 days   Aggravating Factors  sitting  Pain Relieving Factors standing   Multiple Pain Sites No                    [facility] Adult PT Treatment/Exercise - 03/07/14 1445    Transfers   Transfers Sit to Stand;Stand to Sit   Sit to Stand 4: Min assist;4: Min guard;5: Supervision;With upper extremity assist;With armrests;From chair/3-in-1  progressively decreased assistance,   Sit to Stand Details (indicate cue type and reason) PT demo technique, ended with standing from chair without armrests with UE assistance with min gaurd   Stand to Sit 4: Min guard;5: Supervision;With upper extremity assist;With armrests;To chair/3-in-1   Stand to Sit Details PT demo technique, ended with standing from chair without armrests with UE assistance with min gaurd   Ambulation/Gait   Ambulation/Gait Yes   Ambulation/Gait Assistance 4: Min assist;4: Min guard   Ambulation/Gait Assistance Details PT cued with demo  posture /position with RW, step width   Ambulation Distance (Feet) 130 Feet  2x   Assistive device Rolling walker  bilateral BKA prostheses   Gait Pattern Step-through pattern;Decreased step length - right;Decreased stance time - left;Trunk flexed;Poor foot clearance - left;Left hip hike;Abducted - left   Balance   Balance Assessed Yes   Dynamic Standing Balance   Dynamic Standing - Balance Support Bilateral upper extremity supported   Dynamic Standing - Level of Assistance 5: Stand by assistance   Dynamic Standing - Balance Activities Head turns  looking over shoulders   Dynamic Standing - Comments PT demo /instructed technique   Prosthetics   Prosthetic Care Comments  use lotion at night, wipe lotion off before donning prostheses, use of baby oil proximal to knee   Current prosthetic wear tolerance (days/week)  7 days/wk   Current prosthetic wear tolerance (#hours/day)  all awake hours for both prostheses   Residual limb condition  no open areas, callous at right distal tibia, lotion residual   Education Provided Skin check;Residual limb care;Proper wear schedule/adjustment  changing shoes   Person(s) Educated Patient;Spouse;Child(ren)   Education Method Explanation;Demonstration   Education Method Verbalized understanding   Donning Prosthesis Modified independent (device/increased time)   Doffing Prosthesis Modified independent (device/increased time)                PT Education - 03/07/14 1445    Education provided Yes   Education Details prosthetic care, increasing activity level with family assistance for all gait.   Person(s) Educated Patient;Spouse;Child(ren)   Methods Explanation   Comprehension Verbalized understanding          PT Short Term Goals - 03/07/14 1445    PT SHORT TERM GOAL #1   Title verbalizes proper residual limb care with prostheses (Target Date: 03/03/14)   Baseline NOT MET as not seen since evaluation: 03/07/14   Time 4   Period Weeks    Status New   PT SHORT TERM GOAL #2   Title performs standing balance with walker support reaching >10" & managing clothes modified independent.  (Target Date: 03/03/14)   Baseline NOT MET as not seen since evaluation: 03/07/14   Time 4   Period Weeks   Status New   PT SHORT TERM GOAL #3   Title ambulates 300' with rolling walker & prostheses with supervision.  (Target Date: 03/03/14)   Baseline NOT MET as not seen since evaluation: 03/07/14   Time 4   Period Weeks   Status New   PT SHORT TERM GOAL #4   Title negotiate ramp, curb &  stairs (2 rails) with rolling walker & prostheses with supervision.  (Target Date: 03/03/14)   Baseline NOT MET as not seen since evaluation: 03/07/14   Time 4   Period Weeks   Status New           PT Long Term Goals - 02/01/14 1346    PT LONG TERM GOAL #1   Title Patient is independent with all aspects of prosthetic care.  (Target Date: 04/01/14)   Time 8   Period Weeks   Status New   PT LONG TERM GOAL #2   Title Patient tolerates wear of bilateral prostheses >90% of awake hours with skin issues or tenderness.  (Target Date: 04/01/14)   Time 8   Period Weeks   Status New   PT LONG TERM GOAL #3   Title patient ambulates 500' with LRAD & prostheses with family supervision.  (Target Date: 04/01/14)   PT LONG TERM GOAL #4   Title patient ambulates around household furniture carrrying plate /cup with LRAD & prostheses 100'  modified independent.  (Target Date: 04/01/14)   Time 8   Period Weeks   Status New   PT LONG TERM GOAL #5   Title Patient negotiates ramp, curb & stairs with LRAD & prostheses with family supervision.  (Target Date: 04/01/14)   Time 8   Period Weeks   Status New   Additional Long Term Goals   Additional Long Term Goals Yes   PT LONG TERM GOAL #6   Title standing balance: without UE support maintains upright 60 seconds safely; reaches 10", to floor & in upper/lower cabinets with light UE support modified independent.  (Target  Date: 04/01/14)   Time 8   Period Weeks   Status New               Plan - 03/07/14 1445    Clinical Impression Statement She improved sit to/from stand with instruction & practice. Patient improved gait.   Pt will benefit from skilled therapeutic intervention in order to improve on the following deficits Abnormal gait;Decreased activity tolerance;Decreased balance;Decreased knowledge of use of DME;Decreased mobility;Decreased strength;Other (comment)  Prosthetic dependency   Rehab Potential Good   PT Frequency 1x / week   PT Duration 8 weeks   PT Treatment/Interventions ADLs/Self Care Home Management;DME Instruction;Gait training;Stair training;Functional mobility training;Therapeutic activities;Therapeutic exercise;Balance training;Neuromuscular re-education;Patient/family education;Other (comment)  prosthetic training   PT Next Visit Plan gait with walker on barriers   PT Home Exercise Plan balance standing with walker   Consulted and Agree with Plan of Care Patient;Family member/caregiver        Problem List Patient Active Problem List   Diagnosis Date Noted  . Localization-related (focal) (partial) epilepsy and epileptic syndromes with complex partial seizures, with intractable epilepsy 10/30/2013  . Left frontal lobe mass 10/30/2013  . Status epilepticus 09/02/2013  . Acute encephalopathy 09/02/2013  . Seizure 08/29/2013  . Accelerated hypertension 08/29/2013  . Osteomyelitis 08/21/2013  . Atherosclerosis of native arteries of the extremities with ulceration(440.23) 07/14/2013  . Protein-calorie malnutrition, severe 06/20/2013  . Osteomyelitis of ankle or foot, left, acute 06/17/2013  . Charcot foot due to diabetes mellitus 05/13/2013  . PVD (peripheral vascular disease) 03/25/2012  . Atherosclerosis of native arteries of the extremities with intermittent claudication 09/16/2011  . DIABETES MELLITUS 03/14/2008  . HYPERLIPIDEMIA 03/14/2008  . ANEMIA OF CHRONIC  DISEASE 03/14/2008  . ANXIETY DEPRESSION 03/14/2008  . HYPERTENSION, BENIGN 03/14/2008  . CAD, ARTERY BYPASS GRAFT 03/14/2008  . CAROTID  STENOSIS 03/14/2008  . PVD 03/14/2008  . BRUIT 03/14/2008  . SEIZURES, HX OF 03/14/2008  . CAROTID ENDARTERECTOMY, RIGHT, HX OF 03/14/2008  . CHOLECYSTECTOMY, LAPAROSCOPIC, HX OF 03/14/2008    Jamey Reas 03/07/2014, 5:09 PM  [facility] [address] [place], [place], [address] Phone: [phone]   Fax:  [phone]     Jamey Reas, [place], DPT PT Specializing in [place] 03/07/2014 5:10 PM Phone:  [phone]  Fax:  [phone] [facility] [address] [place], [place] [address]

## 2014-03-15 ENCOUNTER — Ambulatory Visit: Payer: Medicare Other | Admitting: Physical Therapy

## 2014-03-20 ENCOUNTER — Ambulatory Visit (INDEPENDENT_AMBULATORY_CARE_PROVIDER_SITE_OTHER): Payer: Medicare Other | Admitting: Neurology

## 2014-03-20 ENCOUNTER — Encounter: Payer: Self-pay | Admitting: Neurology

## 2014-03-20 VITALS — BP 138/66 | HR 75 | Resp 16 | Ht 65.0 in

## 2014-03-20 DIAGNOSIS — G40209 Localization-related (focal) (partial) symptomatic epilepsy and epileptic syndromes with complex partial seizures, not intractable, without status epilepticus: Secondary | ICD-10-CM

## 2014-03-20 DIAGNOSIS — G546 Phantom limb syndrome with pain: Secondary | ICD-10-CM

## 2014-03-20 MED ORDER — PREGABALIN 50 MG PO CAPS: 50.0000 mg | ORAL_CAPSULE | Freq: Every day | ORAL | Status: DC

## 2014-03-20 NOTE — Progress Notes (Signed)
NEUROLOGY FOLLOW UP OFFICE NOTE  GAYANE TUGMAN JT:[id number]  HISTORY OF PRESENT ILLNESS: I had the pleasure of seeing [name] in follow-up in the neurology clinic on 03/20/2014.  The patient was last seen 4 months ago for new onset seizures last June 2015 with abnormal brain MRI showing increased signal in the left frontal region. She is again accompanied by her son who helps supplement the history today.  Records and images were personally reviewed where available.  Since her last visit, a follow-up MRI brain with and without contrast was done 2 months after initial scan. I personally reviewed MRI which showed complete resolution of abnormal signal in the left frontal and parietal regions. There was mild chronic small vessel disease bilaterally. Per report, findings could have been due to cerebritis or conceivably due to venous thrombosis without infarction.   Since her last visit, she has been doing well. No further seizures since June 2015. She is tolerating Keppra better, with resolution of sedation. She is getting better with getting her words together. She had a fall recently when she was trying to walk to her chair and fell backward. She hurt her left wrist and tailbone, and since the fall has had intermittent mild headaches in the occipital region occurring around 2-3 times a week. She does not take any medications for the headaches. No associated nausea, vomiting, photo/phonophobia.  Most bothersome for her is the phantom limb pain where her left ankle was. She has constant burning and stabbing pain, and would rub lotion on the stump. She had tried gabapentin in the past but did not tolerate it.  HPI: [name] with a history of diabetes, hypertension, hyperlipidemia, peripheral vascular disease s/p right carotid endarterectomy, CAD s/p CABG, and right BKA secondary to diabetes. She was admitted for left foot osteomyelitis due to poor healing wound after a fall last  winter. She underwent left transtibial amputation on 08/22/13. She was discharged home on 06/11, then started having altered mental status on 06/15. Her son reports she had hit her head in the left frontal region when getting into their car, then started acting drowsy and out of it the next day. That evening, they were getting ready to eat when her right arm flexed and clenched into her chest followed by head turn to the left,stiffening and shaking for 1 minute as she fell to her left side. She was confused for 10 minutes, back to baseline within 30 minutes. She was brought to [facility] ER where BP was noted to be 205/87. She was given IV labetalol with slow improvement in BP. Blood sugar noted to be 303. Head CT showed low attenuation in the left corona radiata and centrum semiovale. She had an MRI brain initially without contrast which showed increased T2 and FLAIR signal in the left hemisphere from the anterior frontal cortex, supplemental motor area, extending down to the posterior limb of the internal capsule. She became agitated and needed sedation for MRI brain with contrast, however again became restless with motion degradation, no abnormal enhancement seen. Left ICA flow void was noted to be absent, similar since 2009. It was felt that symptoms were not consistent with left MCA ischemia and concern was for viral CNS infection and received 4 days of empiric IV Acyclovir. She underwent a lumbar puncture which showed WBC 1, RBC 17, glucose 82, protein 29, VDRL, CMV, HSV, arbovirus, gram stain and culture negative. No malignant cells on cytology. She was started on  Keppra with no further seizures, discharged home on 06/18. She was apparently intermittently confused at home, woke up better then next day, then had another seizure that morning. Her son today reports she had a generalized convulsion with head deviation to left for 5 minutes. On arrival to the ER, per records, she was poorly responsive with right gaze  deviation, nystagmoid eye movements. BP 197/80 She was given IV Ativan, according to her son she remained in this state for an hour, then was unresponsive for 36 hours after. Prolonged EEG showed diffuse slowing, no electrographic seizures or epileptiform discharges. Repeat MRI brain with and without contrast was again degraded by motion. Similar abnormal signal in the left frontal and parietal lobes again noted, predominantly in the centrum semiovale extending into the subcortical white matter. There is an area of questionable thickening in the left frontal cortex noted to be unchanged, no effacement of sulci in this area, no restricted diffusion, no abnormal enhancement although study is suboptimal. Chronically occluded left ICA again noted. She was discharged home on Keppra XR 1500mg /day.   Of note, in 2009, she had a nocturnal convulsion and was admitted to [facility]. Records unavailable for review, according to her son she was not started on seizure medication due to first seizure. She had an MRI brain at that time which I personally reviewed, it was noted to be limited by motion and patient refusing to complete study, however on FLAIR sequences, white matter changes in the left hemisphere are not present.   Epilepsy Risk Factors: Left frontal white matter lesion that has resolved. Otherwise she had a normal birth and early development. There is no history of febrile convulsions, significant traumatic brain injury, neurosurgical procedures, or family history of seizures.   PAST MEDICAL HISTORY: Past Medical History  Diagnosis Date  . Hypertension   . Neuropathy   . Peripheral vascular disease   . Coronary artery disease   . Anxiety   . Depression   . Anemia   . Type II diabetes mellitus   . High cholesterol   . History of blood transfusion     "one time; related to diabetes I guess"   . Arthritis     "hands"  . GERD (gastroesophageal reflux disease)     takes Zantac  . Complication of  anesthesia     pt felt like she had a hard time waking up after surgery in Feb. 2015.  Marland Kitchen CHF (congestive heart failure)   . Seizures     MEDICATIONS: Current Outpatient Prescriptions on File Prior to Visit  Medication Sig Dispense Refill  . amitriptyline (ELAVIL) 25 MG tablet Take 25 mg by mouth at bedtime as needed for sleep.     Marland Kitchen aspirin 81 MG tablet Take 81 mg by mouth daily.      Marland Kitchen atorvastatin (LIPITOR) 80 MG tablet Take 80 mg by mouth daily.      . clopidogrel (PLAVIX) 75 MG tablet Take 75 mg by mouth daily.     . insulin NPH Human (HUMULIN N,NOVOLIN N) 100 UNIT/ML injection Inject 25-35 Units into the skin 2 (two) times daily before a meal. 35 units in the morning and 25 units in the evening    . Levetiracetam (KEPPRA XR) 750 MG TB24 Take 2 tablets (1,500 mg total) by mouth daily. 120 tablet 0  . metFORMIN (GLUCOPHAGE) 500 MG tablet Take 1 tablet (500 mg total) by mouth 4 (four) times daily. (Patient taking differently: Take 500 mg by mouth 2 (two)  times daily. )    . metoprolol tartrate (LOPRESSOR) 50 MG tablet Take 0.5 tablets (25 mg total) by mouth 2 (two) times daily. 60 tablet 0  . Multiple Vitamins-Minerals (MULTIVITAMINS THER. W/MINERALS) TABS Take 1 tablet by mouth daily.      . quinapril (ACCUPRIL) 40 MG tablet Take 40 mg by mouth daily.     . ranitidine (ZANTAC) 150 MG tablet Take 150 mg by mouth 2 (two) times daily.     . sertraline (ZOLOFT) 100 MG tablet Take 100 mg by mouth 2 (two) times daily.      No current facility-administered medications on file prior to visit.    ALLERGIES: Allergies  Allergen Reactions  . Tape Rash    Redness, rash, itchiness    FAMILY HISTORY: Family History  Problem Relation Age of Onset  . Cancer Mother   . Heart disease Father   . Diabetes Father   . Hyperlipidemia Father   . Hypertension Father     SOCIAL HISTORY: History   Social History  . Marital Status: Married    Spouse Name: N/A    Number of Children: N/A  .  Years of Education: N/A   Occupational History  . Not on file.   Social History Main Topics  . Smoking status: Never Smoker   . Smokeless tobacco: Never Used  . Alcohol Use: No  . Drug Use: No  . Sexual Activity: Not Currently   Other Topics Concern  . Not on file   Social History Narrative    REVIEW OF SYSTEMS: Constitutional: No fevers, chills, or sweats, no generalized fatigue, change in appetite Eyes: No visual changes, double vision, eye pain Ear, nose and throat: No hearing loss, ear pain, nasal congestion, sore throat Cardiovascular: No chest pain, palpitations Respiratory:  No shortness of breath at rest or with exertion, wheezes GastrointestinaI: No nausea, vomiting, diarrhea, abdominal pain, fecal incontinence Genitourinary:  No dysuria, urinary retention or frequency Musculoskeletal:  No neck pain,+ back pain Integumentary: No rash, pruritus, skin lesions Neurological: as above Psychiatric: No depression, insomnia, anxiety Endocrine: No palpitations, fatigue, diaphoresis, mood swings, change in appetite, change in weight, increased thirst Hematologic/Lymphatic:  No anemia, purpura, petechiae. Allergic/Immunologic: no itchy/runny eyes, nasal congestion, recent allergic reactions, rashes  PHYSICAL EXAM: Filed Vitals:   03/20/14 1504  BP: 138/66  Pulse: 75  Resp: 16   General: No acute distress Head:  Normocephalic/atraumatic Neck: supple, no paraspinal tenderness, full range of motion Heart:  Regular rate and rhythm Lungs:  Clear to auscultation bilaterally Back: No paraspinal tenderness Skin/Extremities: No rash, no edema Neurological Exam: alert and oriented to person, place, time. No dysarthria or aphasia, Fund of knowledge is appropriate. 2/3 delayed recall. Remote memory are intact. Attention and concentration are normal. Able to name objects and repeat phrases.  Cranial nerves:  CN I: not tested  CN II: pupils equal, round and reactive to light,  visual fields intact, fundi unremarkable.  CN III, IV, VI: full range of motion, no nystagmus, no ptosis  CN V: facial sensation intact  CN VII: no facial asymmetry CN VIII: hearing intact to finger rub  CN IX, X: gag intact, uvula midline  CN XI: sternocleidomastoid and trapezius muscles intact  CN XII: tongue midline  Bulk & Tone: normal, no fasciculations.  Motor: 5/5 on both UE and both proximal LE, she is s/p amputation below knees bilaterally Sensation: intact to light touch. No extinction to double simultaneous stimulation.  Deep Tendon Reflexes: +2 on both  UE, unable to elicit in both knees  Plantar responses: not tested  Cerebellar: no incoordination on finger to nose testing  Gait: not tested  Tremor: none   IMPRESSION:  [name] with a history of diabetes, hypertension, hyperlipidemia, peripheral vascular disease s/p right carotid endarterectomy, CAD s/p CABG, right BKA, nocturnal convulsion in 2009. She underwent left transtibial amputation on 08/22/13 then had a seizure on 08/25/13. MRI brain showed a lesion in the left frontal and parietal white matter, concerning for vasogenic edema. LP unremarkable, including cytology. Repeat MRI brain 2 months later showed complete resolution of abnormal signal. Considerations include cerebritis, venous thrombosis without infarction. She was noted to have hypertensive emergency with the first seizure, although asymmetric and more frontal, atypical PRES is another consideration, particularly with complete resolution on MRI. No further seizures since end of June. She will continue Keppra XR 1500mg  qhs for now. Her main concern today is the phantom limb pain on left ankle. She will try low dose Lyrica with uptitration as tolerated. Side effects were discussed. She will follow-up in 3 months.  Thank you for allowing me to participate in her care.  The duration of this appointment visit was 25 minutes of face-to-face  time with the patient.  Greater than 50% of this time was spent in counseling, explanation of diagnosis, planning of further management, and coordination of care.   Ellouise Newer, M.D.   CC: Dr. Amanda Cockayne

## 2014-03-20 NOTE — Patient Instructions (Signed)
1. Continue Keppra XR 1500mg  daily 2. Start Lyrica 50mg  at bedtime, call our office if this is helping, we may increase dose 3. Keep a calendar of your headaches, minimize intake of Tylenol or Advil to 2-3 a week to avoid rebound headaches  Seizure Precautions: 1. If medication has been prescribed for you to prevent seizures, take it exactly as directed.  Do not stop taking the medicine without talking to your doctor first, even if you have not had a seizure in a long time.   2. Avoid activities in which a seizure would cause danger to yourself or to others.  Don't operate dangerous machinery, swim alone, or climb in high or dangerous places, such as on ladders, roofs, or girders.  Do not drive unless your doctor says you may.  3. If you have any warning that you may have a seizure, lay down in a safe place where you can't hurt yourself.    4.  No driving for 6 months from last seizure, as per [facility].   Please refer to the following link on the [facility] website for more information: http://[email or web address].org/[email or web address]/driving/drivingu.cfm   5.  Maintain good sleep hygiene.  6.  Contact your doctor if you have any problems that may be related to the medicine you are taking.  7.  Call 911 and bring the patient back to the ED if:        A.  The seizure lasts longer than 5 minutes.       B.  The patient doesn't awaken shortly after the seizure  C.  The patient has new problems such as difficulty seeing, speaking or moving  D.  The patient was injured during the seizure  E.  The patient has a temperature over 102 F (39C)  F.  The patient vomited and now is having trouble breathing

## 2014-03-22 ENCOUNTER — Ambulatory Visit: Payer: Medicare Other | Attending: Orthopedic Surgery | Admitting: Physical Therapy

## 2014-03-22 ENCOUNTER — Encounter: Payer: Self-pay | Admitting: Physical Therapy

## 2014-03-22 ENCOUNTER — Telehealth: Payer: Self-pay | Admitting: Physical Therapy

## 2014-03-22 DIAGNOSIS — R6889 Other general symptoms and signs: Secondary | ICD-10-CM | POA: Insufficient documentation

## 2014-03-22 DIAGNOSIS — R269 Unspecified abnormalities of gait and mobility: Secondary | ICD-10-CM | POA: Insufficient documentation

## 2014-03-22 DIAGNOSIS — Z89511 Acquired absence of right leg below knee: Secondary | ICD-10-CM | POA: Insufficient documentation

## 2014-03-22 DIAGNOSIS — Z89512 Acquired absence of left leg below knee: Secondary | ICD-10-CM | POA: Diagnosis not present

## 2014-03-22 NOTE — Therapy (Signed)
[facility] [address] [place], [place], [address] Phone: [phone]   Fax:  [phone]  Physical Therapy Treatment  Patient Details  Name: [name] MRN: [id number] Date of Birth: Mar 14, 1956 Referring Provider:  [name], MD  Encounter Date: 03/22/2014      PT End of Session - 03/22/14 1558    Visit Number 3   Number of Visits 9   Date for PT Re-Evaluation 04/03/14   PT Start Time 1500   PT Stop Time 1545   PT Time Calculation (min) 45 min   Equipment Utilized During Treatment Gait belt   Activity Tolerance Patient tolerated treatment well   Behavior During Therapy [facility] for tasks assessed/performed      Past Medical History  Diagnosis Date  . Hypertension   . Neuropathy   . Peripheral vascular disease   . Coronary artery disease   . Anxiety   . Depression   . Anemia   . Type II diabetes mellitus   . High cholesterol   . History of blood transfusion     "one time; related to diabetes I guess"   . Arthritis     "hands"  . GERD (gastroesophageal reflux disease)     takes Zantac  . Complication of anesthesia     pt felt like she had a hard time waking up after surgery in Feb. 2015.  Marland Kitchen CHF (congestive heart failure)   . Seizures     Past Surgical History  Procedure Laterality Date  . Toe amputation Right     "took off a couple toes before the amputation"  . Below knee leg amputation Right Jan. 2008  . Carotid endarterectomy Right   . Cholecystectomy    . Ankle fusion Left 05/11/2013    TIBIOCALCANEAL FUSION   . Tonsillectomy    . Dilation and curettage of uterus    . Coronary artery bypass graft  12/01/2007    "CABG X4" (05/12/2013)  . Ankle fusion Left 05/12/2013    Procedure: LEFT TIBIOCALCANEAL FUSION;  Surgeon: Newt Minion, MD;  Location: [facility];  Service: Orthopedics;  Laterality: Left;  Left Tibiocalcaneal Fusion  . Colonoscopy    . Irrigation and debridement abscess Left  06/17/2013    ANKLE           DR [name]  . I&d extremity Left 06/17/2013    Procedure: IRRIGATION AND DEBRIDEMENT EXTREMITY, PLACEMENT ANTIBIOTIC STIMULAN BEADS;  Surgeon: Newt Minion, MD;  Location: [facility];  Service: Orthopedics;  Laterality: Left;  . Application of wound vac  06/17/2013    Procedure: APPLICATION OF WOUND VAC;  Surgeon: Newt Minion, MD;  Location: [facility];  Service: Orthopedics;;  . Amputation Left 08/22/2013    Procedure: LEFT BELOW KNEE AMPUTATION;  Surgeon: Newt Minion, MD;  Location: [facility];  Service: Orthopedics;  Laterality: Left;    There were no vitals taken for this visit.  Visit Diagnosis:  Activity intolerance  Abnormality of gait  Status post bilateral below knee amputation      Subjective Assessment - 03/22/14 1504    Symptoms Denies falls. Started Lyrica last night but read side effects so concerned.   Currently in Pain? No/denies                    [facility] Adult PT Treatment/Exercise - 03/22/14 1445    Transfers   Transfers Sit to Stand;Stand to Sit   Sit to Stand 4: Min  guard;5: Supervision;With upper extremity assist;With armrests;From chair/3-in-1  from rollator   Stand to Sit 4: Min guard;5: Supervision;With upper extremity assist;With armrests;To chair/3-in-1  to rollator   Ambulation/Gait   Ambulation/Gait Yes   Ambulation/Gait Assistance 4: Min assist;4: Min guard   Ambulation/Gait Assistance Details PT demo/instructed in rollator /brakes.   Ambulation Distance (Feet) 130 Feet  130 with RW, 61' around furniture & 135' with rollator   Assistive device Rolling walker;Rollator  bilateral BKA prostheses   Gait Pattern Step-through pattern;Decreased step length - right;Decreased stance time - left;Trunk flexed;Poor foot clearance - left;Left hip hike;Abducted - left   Balance   Balance Assessed Yes   Dynamic Standing Balance   Dynamic Standing - Balance Support Bilateral upper extremity supported   Dynamic Standing - Level of  Assistance 5: Stand by assistance   Dynamic Standing - Balance Activities Head turns  looking over shoulders   Prosthetics   Prosthetic Care Comments  use of shrinkers or prosthesis with Lyrica (side effect of swelling)   Current prosthetic wear tolerance (days/week)  7 days/wk   Current prosthetic wear tolerance (#hours/day)  all awake hours for both prostheses   Residual limb condition  no open areas, callous at right distal tibia, lotion residual   Education Provided Proper wear schedule/adjustment   Person(s) Educated Patient;Child(ren)   Education Method Explanation   Education Method Verbalized understanding   Donning Prosthesis Modified independent (device/increased time)   Doffing Prosthesis Modified independent (device/increased time)                PT Education - 03/22/14 1445    Education provided Yes   Education Details Lyrica with prosthesis - need to wear shrinker or prostheses at all times.    Person(s) Educated Patient;Child(ren)   Methods Explanation   Comprehension Verbalized understanding          PT Short Term Goals - 03/07/14 1445    PT SHORT TERM GOAL #1   Title verbalizes proper residual limb care with prostheses (Target Date: 03/03/14)   Baseline NOT MET as not seen since evaluation: 03/07/14   Time 4   Period Weeks   Status New   PT SHORT TERM GOAL #2   Title performs standing balance with walker support reaching >10" & managing clothes modified independent.  (Target Date: 03/03/14)   Baseline NOT MET as not seen since evaluation: 03/07/14   Time 4   Period Weeks   Status New   PT SHORT TERM GOAL #3   Title ambulates 300' with rolling walker & prostheses with supervision.  (Target Date: 03/03/14)   Baseline NOT MET as not seen since evaluation: 03/07/14   Time 4   Period Weeks   Status New   PT SHORT TERM GOAL #4   Title negotiate ramp, curb & stairs (2 rails) with rolling walker & prostheses with supervision.  (Target Date: 03/03/14)    Baseline NOT MET as not seen since evaluation: 03/07/14   Time 4   Period Weeks   Status New           PT Long Term Goals - 02/01/14 1346    PT LONG TERM GOAL #1   Title Patient is independent with all aspects of prosthetic care.  (Target Date: 04/01/14)   Time 8   Period Weeks   Status New   PT LONG TERM GOAL #2   Title Patient tolerates wear of bilateral prostheses >90% of awake hours with skin issues or tenderness.  (Target Date:  04/01/14)   Time 8   Period Weeks   Status New   PT LONG TERM GOAL #3   Title patient ambulates 500' with LRAD & prostheses with family supervision.  (Target Date: 04/01/14)   PT LONG TERM GOAL #4   Title patient ambulates around household furniture carrrying plate /cup with LRAD & prostheses 100'  modified independent.  (Target Date: 04/01/14)   Time 8   Period Weeks   Status New   PT LONG TERM GOAL #5   Title Patient negotiates ramp, curb & stairs with LRAD & prostheses with family supervision.  (Target Date: 04/01/14)   Time 8   Period Weeks   Status New   Additional Long Term Goals   Additional Long Term Goals Yes   PT LONG TERM GOAL #6   Title standing balance: without UE support maintains upright 60 seconds safely; reaches 10", to floor & in upper/lower cabinets with light UE support modified independent.  (Target Date: 04/01/14)   Time 8   Period Weeks   Status New               Plan - 03/22/14 1445    Clinical Impression Statement Patient would benefit from rollator walker to improve mobility with ability to rest as needed.   Pt will benefit from skilled therapeutic intervention in order to improve on the following deficits Abnormal gait;Decreased activity tolerance;Decreased balance;Decreased knowledge of use of DME;Decreased mobility;Decreased strength;Other (comment)  Prosthetic dependency   Rehab Potential Good   PT Frequency 1x / week   PT Duration 8 weeks   PT Treatment/Interventions ADLs/Self Care Home Management;DME  Instruction;Gait training;Stair training;Functional mobility training;Therapeutic activities;Therapeutic exercise;Balance training;Neuromuscular re-education;Patient/family education;Other (comment)  prosthetic training   PT Next Visit Plan gait with walker on barriers   PT Home Exercise Plan balance standing with walker   Consulted and Agree with Plan of Care Patient;Family member/caregiver        Problem List Patient Active Problem List   Diagnosis Date Noted  . Localization-related (focal) (partial) epilepsy and epileptic syndromes with complex partial seizures, with intractable epilepsy 10/30/2013  . Left frontal lobe mass 10/30/2013  . Status epilepticus 09/02/2013  . Acute encephalopathy 09/02/2013  . Seizure 08/29/2013  . Accelerated hypertension 08/29/2013  . Osteomyelitis 08/21/2013  . Atherosclerosis of native arteries of the extremities with ulceration(440.23) 07/14/2013  . Protein-calorie malnutrition, severe 06/20/2013  . Osteomyelitis of ankle or foot, left, acute 06/17/2013  . Charcot foot due to diabetes mellitus 05/13/2013  . PVD (peripheral vascular disease) 03/25/2012  . Atherosclerosis of native arteries of the extremities with intermittent claudication 09/16/2011  . DIABETES MELLITUS 03/14/2008  . HYPERLIPIDEMIA 03/14/2008  . ANEMIA OF CHRONIC DISEASE 03/14/2008  . ANXIETY DEPRESSION 03/14/2008  . HYPERTENSION, BENIGN 03/14/2008  . CAD, ARTERY BYPASS GRAFT 03/14/2008  . CAROTID STENOSIS 03/14/2008  . PVD 03/14/2008  . BRUIT 03/14/2008  . SEIZURES, HX OF 03/14/2008  . CAROTID ENDARTERECTOMY, RIGHT, HX OF 03/14/2008  . CHOLECYSTECTOMY, LAPAROSCOPIC, HX OF 03/14/2008    Jamey Reas 03/22/2014, 4:01 PM  [facility] [address] [place], [place], [address] Phone: [phone]   Fax:  [phone]     Jamey Reas, [place], DPT PT Specializing in [place] 03/22/2014 4:02  PM Phone:  [phone]  Fax:  [phone] [facility] [address] [place], [place] [address]

## 2014-03-22 NOTE — Telephone Encounter (Signed)
[name] needs a rollator walker. Can you please send a prescription back via Epic for a rollator walker? Thank you Jamey Reas, PT, DPT PT Specializing in [place] @TODAY @ 4:05 PM Phone:  [phone]  Fax:  [phone] [facility] [address] [employer] [place], [place] [address]

## 2014-03-24 ENCOUNTER — Encounter: Payer: Self-pay | Admitting: Neurology

## 2014-03-24 NOTE — Progress Notes (Signed)
Done

## 2014-03-29 ENCOUNTER — Ambulatory Visit: Payer: Medicare Other | Admitting: Physical Therapy

## 2014-03-29 DIAGNOSIS — Z89511 Acquired absence of right leg below knee: Secondary | ICD-10-CM

## 2014-03-29 DIAGNOSIS — R6889 Other general symptoms and signs: Secondary | ICD-10-CM | POA: Diagnosis not present

## 2014-03-29 DIAGNOSIS — R269 Unspecified abnormalities of gait and mobility: Secondary | ICD-10-CM

## 2014-03-29 DIAGNOSIS — Z89512 Acquired absence of left leg below knee: Secondary | ICD-10-CM

## 2014-03-30 ENCOUNTER — Encounter (HOSPITAL_COMMUNITY): Payer: Self-pay | Admitting: Gastroenterology

## 2014-03-30 ENCOUNTER — Telehealth: Payer: Self-pay | Admitting: Physical Therapy

## 2014-03-30 NOTE — Telephone Encounter (Signed)
[name] needs a rollator walker for prosthetic gait. Can you please write a prescription and attach to bottom of this encounter? Please include your NPI under your signature.

## 2014-03-30 NOTE — Therapy (Signed)
[facility] [address] [place], [place], [address] Phone: [phone]   Fax:  [phone]  Physical Therapy Treatment  Patient Details  Name: [name] MRN: [id number] Date of Birth: Mar 05, 1956 Referring Provider:  [name], MD  Encounter Date: 03/29/2014      PT End of Session - 03/29/14 1500    Visit Number 4   Number of Visits 9   Date for PT Re-Evaluation 04/03/14   PT Start Time 1500   PT Stop Time 1540   PT Time Calculation (min) 40 min   Equipment Utilized During Treatment Gait belt   Activity Tolerance Patient tolerated treatment well   Behavior During Therapy [facility] for tasks assessed/performed      Past Medical History  Diagnosis Date  . Hypertension   . Neuropathy   . Peripheral vascular disease   . Coronary artery disease   . Anxiety   . Depression   . Anemia   . Type II diabetes mellitus   . High cholesterol   . History of blood transfusion     "one time; related to diabetes I guess"   . Arthritis     "hands"  . GERD (gastroesophageal reflux disease)     takes Zantac  . Complication of anesthesia     pt felt like she had a hard time waking up after surgery in Feb. 2015.  Marland Kitchen CHF (congestive heart failure)   . Seizures     Past Surgical History  Procedure Laterality Date  . Toe amputation Right     "took off a couple toes before the amputation"  . Below knee leg amputation Right Jan. 2008  . Carotid endarterectomy Right   . Cholecystectomy    . Ankle fusion Left 05/11/2013    TIBIOCALCANEAL FUSION   . Tonsillectomy    . Dilation and curettage of uterus    . Coronary artery bypass graft  12/01/2007    "CABG X4" (05/12/2013)  . Ankle fusion Left 05/12/2013    Procedure: LEFT TIBIOCALCANEAL FUSION;  Surgeon: Newt Minion, MD;  Location: [place];  Service: Orthopedics;  Laterality: Left;  Left Tibiocalcaneal Fusion  . Colonoscopy    . Irrigation and debridement abscess Left  06/17/2013    ANKLE           DR [name]  . I&d extremity Left 06/17/2013    Procedure: IRRIGATION AND DEBRIDEMENT EXTREMITY, PLACEMENT ANTIBIOTIC STIMULAN BEADS;  Surgeon: Newt Minion, MD;  Location: [place];  Service: Orthopedics;  Laterality: Left;  . Application of wound vac  06/17/2013    Procedure: APPLICATION OF WOUND VAC;  Surgeon: Newt Minion, MD;  Location: [place];  Service: Orthopedics;;  . Amputation Left 08/22/2013    Procedure: LEFT BELOW KNEE AMPUTATION;  Surgeon: Newt Minion, MD;  Location: [facility];  Service: Orthopedics;  Laterality: Left;    There were no vitals taken for this visit.  Visit Diagnosis:  Activity intolerance - Plan: PT plan of care cert/re-cert  Abnormality of gait - Plan: PT plan of care cert/re-cert  Status post bilateral below knee amputation - Plan: PT plan of care cert/re-cert      Subjective Assessment - 03/30/14 1150    Symptoms She reports wound on left leg.   Currently in Pain? No/denies                    [facility] Adult PT Treatment/Exercise - 03/29/14 1500    Transfers  Transfers Sit to Stand;Stand to Sit   Sit to Stand 4: Min guard;5: Supervision;With upper extremity assist;With armrests;From chair/3-in-1  from rollator   Stand to Sit 4: Min guard;5: Supervision;With upper extremity assist;With armrests;To chair/3-in-1  to rollator   Ambulation/Gait   Ambulation/Gait Yes   Ambulation/Gait Assistance 5: Supervision   Ambulation/Gait Assistance Details Cues on rollator control / safety   Ambulation Distance (Feet) 125 Feet  125 indoors & 140 indoor to outdoors on sidewalk   Assistive device Rollator;Prostheses   Gait Pattern Step-through pattern;Trunk flexed   Ambulation Surface Level;Indoor;Outdoor;Paved   Ramp 4: Min assist  rollator & prostheses   Ramp Details (indicate cue type and reason) verbal & tactile cues for technique   Curb 4: Min assist  rollator & prostheses   Curb Details (indicate cue type and reason) verbal &  tactile cues for technique   Ambulation/Gait Yes   Ambulation/Gait Assistance 4: Min assist;4: Min Physiological scientist walker;Rollator  bilateral BKA prostheses   Gait Pattern Step-through pattern;Decreased step length - right;Decreased stance time - left;Trunk flexed;Poor foot clearance - left;Left hip hike;Abducted - left   Balance   Balance Assessed Yes   Dynamic Standing Balance   Dynamic Standing - Balance Support --   Dynamic Standing - Level of Assistance --   Dynamic Standing - Balance Activities --   Prosthetics   Prosthetic Care Comments  Use of Tegaderm for wound during day & gauze /shrinker at night   Current prosthetic wear tolerance (days/week)  7 days/wk   Current prosthetic wear tolerance (#hours/day)  all awake hours for both prostheses   Residual limb condition  distal medial limb has open wound with no signs of infection   Education Provided Skin check;Residual limb care  signs of sweating, drying limb /liner   Person(s) Educated Patient;Spouse;Child(ren)   Education Method Explanation;Demonstration   Education Method Verbalized understanding   Donning Prosthesis Modified independent (device/increased time)   Doffing Prosthesis Modified independent (device/increased time)                PT Education - 03/29/14 1500    Education provided Yes   Education Details use of rollator on ramp & curb, see prosthetic care   Person(s) Educated Patient;Spouse;Child(ren)   Methods Explanation;Demonstration;Tactile cues;Verbal cues   Comprehension Verbalized understanding;Returned demonstration;Need further instruction          PT Short Term Goals - 03/07/14 1445    PT SHORT TERM GOAL #1   Title verbalizes proper residual limb care with prostheses (Target Date: 03/03/14)   Baseline NOT MET as not seen since evaluation: 03/07/14   Time 4   Period Weeks   Status New   PT SHORT TERM GOAL #2   Title performs standing balance with walker support  reaching >10" & managing clothes modified independent.  (Target Date: 03/03/14)   Baseline NOT MET as not seen since evaluation: 03/07/14   Time 4   Period Weeks   Status New   PT SHORT TERM GOAL #3   Title ambulates 300' with rolling walker & prostheses with supervision.  (Target Date: 03/03/14)   Baseline NOT MET as not seen since evaluation: 03/07/14   Time 4   Period Weeks   Status New   PT SHORT TERM GOAL #4   Title negotiate ramp, curb & stairs (2 rails) with rolling walker & prostheses with supervision.  (Target Date: 03/03/14)   Baseline NOT MET as not seen since evaluation: 03/07/14   Time  4   Period Weeks   Status New           PT Long Term Goals - 03/29/14 1500    PT LONG TERM GOAL #1   Title Patient is independent with all aspects of prosthetic care.  (New Target Date: 04/28/14)   Baseline 03/29/14 Patient requires cues for residual limb management & adjusting ply socks.   Time 8   Period Weeks   Status On-going   PT LONG TERM GOAL #2   Title Patient tolerates wear of bilateral prostheses >90% of awake hours with skin issues or tenderness.   (New Target Date: 04/28/14)   Baseline NOT MET 03/29/14 has wound on left limb.    Time 8   Period Weeks   Status New   PT LONG TERM GOAL #3   Title patient ambulates 500' with LRAD & prostheses with family supervision.   (New Target Date: 04/28/14)   Baseline NOT MET 03/29/14 Patient ambulates 130' with rollator walker & prostheses with supervision /cues from PT.    Status On-going   PT LONG TERM GOAL #4   Title patient ambulates around household furniture carrrying plate /cup with LRAD & prostheses 100'  modified independent.  (New Target Date: 04/28/14)   Baseline NOT MET patient requires supervision & cues from PT   Time 8   Period Weeks   Status On-going   PT LONG TERM GOAL #5   Title Patient negotiates ramp, curb & stairs with LRAD & prostheses with family supervision.  (New Target Date: 04/28/14)   Baseline 03/29/14 NOT  MET Patient requires minimal assist & cues from PT.   Time 8   Period Weeks   Status On-going   PT LONG TERM GOAL #6   Title standing balance: without UE support maintains upright 60 seconds safely; reaches 10", to floor & in upper/lower cabinets with light UE support modified independent.   (New Target Date: 04/28/14)   Baseline NOT MET 03/29/14   Time 8   Period Weeks   Status On-going               Plan - 03/29/14 1500    Clinical Impression Statement Patient seems that rollator walker would be safest option for assistive devices for gait with bilateral prostheses. Patient has progressed towards LTGs but not met due to limited attendance with PT. She would benefit from additional 4 weeks at 1x/wk of PT for skilled instruction /care.   Pt will benefit from skilled therapeutic intervention in order to improve on the following deficits Abnormal gait;Decreased activity tolerance;Decreased balance;Decreased knowledge of use of DME;Decreased mobility;Decreased strength;Other (comment)  Prosthetic dependency   Rehab Potential Good   PT Frequency 1x / week   PT Duration 4 weeks   PT Treatment/Interventions ADLs/Self Care Home Management;DME Instruction;Gait training;Stair training;Functional mobility training;Therapeutic activities;Therapeutic exercise;Balance training;Neuromuscular re-education;Patient/family education;Other (comment)  prosthetic training   PT Next Visit Plan gait with walker on barriers   PT Home Exercise Plan balance standing with walker   Consulted and Agree with Plan of Care Patient;Family member/caregiver   Family Member Consulted 2 sons & husband        Problem List Patient Active Problem List   Diagnosis Date Noted  . Localization-related (focal) (partial) epilepsy and epileptic syndromes with complex partial seizures, with intractable epilepsy 10/30/2013  . Left frontal lobe mass 10/30/2013  . Status epilepticus 09/02/2013  . Acute encephalopathy  09/02/2013  . Seizure 08/29/2013  . Accelerated hypertension 08/29/2013  . Osteomyelitis  08/21/2013  . Atherosclerosis of native arteries of the extremities with ulceration(440.23) 07/14/2013  . Protein-calorie malnutrition, severe 06/20/2013  . Osteomyelitis of ankle or foot, left, acute 06/17/2013  . Charcot foot due to diabetes mellitus 05/13/2013  . PVD (peripheral vascular disease) 03/25/2012  . Atherosclerosis of native arteries of the extremities with intermittent claudication 09/16/2011  . DIABETES MELLITUS 03/14/2008  . HYPERLIPIDEMIA 03/14/2008  . ANEMIA OF CHRONIC DISEASE 03/14/2008  . ANXIETY DEPRESSION 03/14/2008  . HYPERTENSION, BENIGN 03/14/2008  . CAD, ARTERY BYPASS GRAFT 03/14/2008  . CAROTID STENOSIS 03/14/2008  . PVD 03/14/2008  . BRUIT 03/14/2008  . SEIZURES, HX OF 03/14/2008  . CAROTID ENDARTERECTOMY, RIGHT, HX OF 03/14/2008  . CHOLECYSTECTOMY, LAPAROSCOPIC, HX OF 03/14/2008    Augustina Braddock 03/30/2014, 12:12 PM  [facility] [address] [place], [place], [address] Phone: [phone]   Fax:  [phone]   Jamey Reas, PT, DPT PT Specializing in [place] '@TODAY' (<PARAMETER> error)@ 12:12 PM Phone:  [phone]  Fax:  [phone] [facility] [address] [place], [place] [address]

## 2014-04-05 ENCOUNTER — Telehealth: Payer: Self-pay | Admitting: Family Medicine

## 2014-04-05 ENCOUNTER — Ambulatory Visit: Payer: Medicare Other | Admitting: Physical Therapy

## 2014-04-05 ENCOUNTER — Encounter: Payer: Self-pay | Admitting: Physical Therapy

## 2014-04-05 DIAGNOSIS — Z89511 Acquired absence of right leg below knee: Secondary | ICD-10-CM

## 2014-04-05 DIAGNOSIS — R6889 Other general symptoms and signs: Secondary | ICD-10-CM | POA: Diagnosis not present

## 2014-04-05 DIAGNOSIS — R269 Unspecified abnormalities of gait and mobility: Secondary | ICD-10-CM

## 2014-04-05 DIAGNOSIS — Z89512 Acquired absence of left leg below knee: Secondary | ICD-10-CM

## 2014-04-05 MED ORDER — PREGABALIN 50 MG PO CAPS: ORAL_CAPSULE | ORAL | Status: DC

## 2014-04-05 NOTE — Telephone Encounter (Signed)
-----   Message from Cameron Sprang, MD sent at 04/05/2014 12:25 PM EST ----- Regarding: Lyrica She did well on Lyrica but was told it may cause swelling. She is taking Lyrica 50mg  every other day. Can you pls send Rx for Lyrica 50mg  every other day. Dx. Phantom limb pain. Dispense #15, with 4 refills. Thanks

## 2014-04-05 NOTE — Telephone Encounter (Signed)
New Rx sent to patient's pharmacy.

## 2014-04-06 NOTE — Therapy (Signed)
[facility] [address] [place], [place], [address] Phone: [phone]   Fax:  [phone]  Physical Therapy Treatment  Patient Details  Name: [name] MRN: [id number] Date of Birth: 06/16/1955 Referring Provider:  [name], MD  Encounter Date: 04/05/2014      PT End of Session - 04/06/14 1445    Visit Number 5   Number of Visits 9   Date for PT Re-Evaluation 04/03/14   PT Start Time 3382   PT Stop Time 1540   PT Time Calculation (min) 45 min   Equipment Utilized During Treatment Gait belt   Activity Tolerance Patient tolerated treatment well   Behavior During Therapy [facility] for tasks assessed/performed      Past Medical History  Diagnosis Date  . Hypertension   . Neuropathy   . Peripheral vascular disease   . Coronary artery disease   . Anxiety   . Depression   . Anemia   . Type II diabetes mellitus   . High cholesterol   . History of blood transfusion     "one time; related to diabetes I guess"   . Arthritis     "hands"  . GERD (gastroesophageal reflux disease)     takes Zantac  . Complication of anesthesia     pt felt like she had a hard time waking up after surgery in Feb. 2015.  Marland Kitchen CHF (congestive heart failure)   . Seizures     Past Surgical History  Procedure Laterality Date  . Toe amputation Right     "took off a couple toes before the amputation"  . Below knee leg amputation Right Jan. 2008  . Carotid endarterectomy Right   . Cholecystectomy    . Ankle fusion Left 05/11/2013    TIBIOCALCANEAL FUSION   . Tonsillectomy    . Dilation and curettage of uterus    . Coronary artery bypass graft  12/01/2007    "CABG X4" (05/12/2013)  . Ankle fusion Left 05/12/2013    Procedure: LEFT TIBIOCALCANEAL FUSION;  Surgeon: Newt Minion, MD;  Location: [place];  Service: Orthopedics;  Laterality: Left;  Left Tibiocalcaneal Fusion  . Colonoscopy    . Irrigation and debridement abscess Left  06/17/2013    ANKLE           DR [name]  . I&d extremity Left 06/17/2013    Procedure: IRRIGATION AND DEBRIDEMENT EXTREMITY, PLACEMENT ANTIBIOTIC STIMULAN BEADS;  Surgeon: Newt Minion, MD;  Location: [place];  Service: Orthopedics;  Laterality: Left;  . Application of wound vac  06/17/2013    Procedure: APPLICATION OF WOUND VAC;  Surgeon: Newt Minion, MD;  Location: [place];  Service: Orthopedics;;  . Amputation Left 08/22/2013    Procedure: LEFT BELOW KNEE AMPUTATION;  Surgeon: Newt Minion, MD;  Location: [place];  Service: Orthopedics;  Laterality: Left;    There were no vitals taken for this visit.  Visit Diagnosis:  Activity intolerance  Abnormality of gait  Status post bilateral below knee amputation      Subjective Assessment - 04/05/14 1458    Symptoms She was using Tegaderm until ran out 2 days ago. Wound is healing. Wearing prostheses all awake hours mainly.    Currently in Pain? No/denies                    [facility] Adult PT Treatment/Exercise - 04/05/14 1445    Transfers   Transfers Sit to Stand;Stand to  Sit   Sit to Stand 4: Min guard;5: Supervision;With upper extremity assist;With armrests;From chair/3-in-1  from rollator   Stand to Sit 4: Min guard;5: Supervision;With upper extremity assist;With armrests;To chair/3-in-1  to rollator   Ambulation/Gait   Ambulation/Gait Yes   Ambulation/Gait Assistance 4: Min assist   Ambulation/Gait Assistance Details verbal with demo instruction on rollator positioning /posture, safety   Ambulation Distance (Feet) 250 Feet  2x    Assistive device Rollator;Prostheses   Gait Pattern Step-through pattern;Trunk flexed  poor clearance when trunk too flexed and off balance   Ambulation Surface Level;Indoor   Stairs Yes   Stairs Assistance 4: Min assist   Stairs Assistance Details (indicate cue type and reason) PT demo /instructed technique and how son assist for safety   Stair Management Technique Two rails;Step to  pattern;Forwards  prostheses   Number of Stairs 4   Ramp --   Curb --   Ambulation/Gait Yes   Ambulation/Gait Assistance 4: Min assist;4: Min Higher education careers adviser  bilateral BKA prostheses   Gait Pattern Step-through pattern;Decreased step length - right;Decreased stance time - left;Trunk flexed;Poor foot clearance - left;Left hip hike;Abducted - left   Balance   Balance Assessed Yes   Dynamic Standing Balance   Dynamic Standing - Balance Support Right upper extremity supported;Left upper extremity supported   Dynamic Standing - Level of Assistance 4: Min assist   Dynamic Standing - Balance Activities Reaching for objects  single UE red theraband 10X each row, upward & forward reach   Dynamic Standing - Comments verbal & tactile cues   Prosthetics   Prosthetic Care Comments  Use of Tegaderm for wound during day & gauze /shrinker at night   Current prosthetic wear tolerance (days/week)  7 days/wk   Current prosthetic wear tolerance (#hours/day)  all awake hours for both prostheses   Residual limb condition  distal medial limb has open wound with no signs of infection   Education Provided Residual limb care;Correct ply sock adjustment   Person(s) Educated Patient;Child(ren)   Education Method Explanation;Demonstration   Education Method Verbalized understanding                PT Education - 04/05/14 1445    Education provided Yes   Education Details stairs and assisting, use of rollator vs trying cane for safety and risk of fx if falls,    Person(s) Educated Patient;Child(ren)   Methods Explanation;Demonstration   Comprehension Verbalized understanding          PT Short Term Goals - 03/07/14 1445    PT SHORT TERM GOAL #1   Title verbalizes proper residual limb care with prostheses (Target Date: 03/03/14)   Baseline NOT MET as not seen since evaluation: 03/07/14   Time 4   Period Weeks   Status New   PT SHORT TERM GOAL #2   Title  performs standing balance with walker support reaching >10" & managing clothes modified independent.  (Target Date: 03/03/14)   Baseline NOT MET as not seen since evaluation: 03/07/14   Time 4   Period Weeks   Status New   PT SHORT TERM GOAL #3   Title ambulates 300' with rolling walker & prostheses with supervision.  (Target Date: 03/03/14)   Baseline NOT MET as not seen since evaluation: 03/07/14   Time 4   Period Weeks   Status New   PT SHORT TERM GOAL #4   Title negotiate ramp, curb & stairs (2 rails) with rolling walker &  prostheses with supervision.  (Target Date: 03/03/14)   Baseline NOT MET as not seen since evaluation: 03/07/14   Time 4   Period Weeks   Status New           PT Long Term Goals - 03/29/14 1500    PT LONG TERM GOAL #1   Title Patient is independent with all aspects of prosthetic care.  (New Target Date: 04/28/14)   Baseline 03/29/14 Patient requires cues for residual limb management & adjusting ply socks.   Time 8   Period Weeks   Status On-going   PT LONG TERM GOAL #2   Title Patient tolerates wear of bilateral prostheses >90% of awake hours with skin issues or tenderness.   (New Target Date: 04/28/14)   Baseline NOT MET 03/29/14 has wound on left limb.    Time 8   Period Weeks   Status New   PT LONG TERM GOAL #3   Title patient ambulates 500' with LRAD & prostheses with family supervision.   (New Target Date: 04/28/14)   Baseline NOT MET 03/29/14 Patient ambulates 130' with rollator walker & prostheses with supervision /cues from PT.    Status On-going   PT LONG TERM GOAL #4   Title patient ambulates around household furniture carrrying plate /cup with LRAD & prostheses 100'  modified independent.  (New Target Date: 04/28/14)   Baseline NOT MET patient requires supervision & cues from PT   Time 8   Period Weeks   Status On-going   PT LONG TERM GOAL #5   Title Patient negotiates ramp, curb & stairs with LRAD & prostheses with family supervision.  (New  Target Date: 04/28/14)   Baseline 03/29/14 NOT MET Patient requires minimal assist & cues from PT.   Time 8   Period Weeks   Status On-going   PT LONG TERM GOAL #6   Title standing balance: without UE support maintains upright 60 seconds safely; reaches 10", to floor & in upper/lower cabinets with light UE support modified independent.   (New Target Date: 04/28/14)   Baseline NOT MET 03/29/14   Time 8   Period Weeks   Status On-going               Plan - 04/06/14 1455    Clinical Impression Statement Patient and sons appear to understand basic technique to negotiate stairs. She seems to understand why use of walker vs cane is safer and would increase her independence and acitvity level.   Pt will benefit from skilled therapeutic intervention in order to improve on the following deficits Abnormal gait;Decreased activity tolerance;Decreased balance;Decreased knowledge of use of DME;Decreased mobility;Decreased strength;Other (comment)  Prosthetic dependency   Rehab Potential Good   PT Frequency 1x / week   PT Duration 4 weeks   PT Treatment/Interventions ADLs/Self Care Home Management;DME Instruction;Gait training;Stair training;Functional mobility training;Therapeutic activities;Therapeutic exercise;Balance training;Neuromuscular re-education;Patient/family education;Other (comment)  prosthetic training   PT Next Visit Plan gait with walker on barriers   PT Home Exercise Plan balance standing with walker   Consulted and Agree with Plan of Care Patient;Family member/caregiver   Family Member Consulted 2 sons        Problem List Patient Active Problem List   Diagnosis Date Noted  . Localization-related (focal) (partial) epilepsy and epileptic syndromes with complex partial seizures, with intractable epilepsy 10/30/2013  . Left frontal lobe mass 10/30/2013  . Status epilepticus 09/02/2013  . Acute encephalopathy 09/02/2013  . Seizure 08/29/2013  . Accelerated hypertension  08/29/2013  . Osteomyelitis 08/21/2013  . Atherosclerosis of native arteries of the extremities with ulceration(440.23) 07/14/2013  . Protein-calorie malnutrition, severe 06/20/2013  . Osteomyelitis of ankle or foot, left, acute 06/17/2013  . Charcot foot due to diabetes mellitus 05/13/2013  . PVD (peripheral vascular disease) 03/25/2012  . Atherosclerosis of native arteries of the extremities with intermittent claudication 09/16/2011  . DIABETES MELLITUS 03/14/2008  . HYPERLIPIDEMIA 03/14/2008  . ANEMIA OF CHRONIC DISEASE 03/14/2008  . ANXIETY DEPRESSION 03/14/2008  . HYPERTENSION, BENIGN 03/14/2008  . CAD, ARTERY BYPASS GRAFT 03/14/2008  . CAROTID STENOSIS 03/14/2008  . PVD 03/14/2008  . BRUIT 03/14/2008  . SEIZURES, HX OF 03/14/2008  . CAROTID ENDARTERECTOMY, RIGHT, HX OF 03/14/2008  . CHOLECYSTECTOMY, LAPAROSCOPIC, HX OF 03/14/2008    Jamey Reas PT, DPT Specializing in Prosthetics 04/06/2014, 3:13 PM  [facility] [address] [place], [place], [address] Phone: [phone]   Fax:  [phone]

## 2014-04-19 ENCOUNTER — Ambulatory Visit: Payer: Medicare Other | Attending: Orthopedic Surgery | Admitting: Physical Therapy

## 2014-04-19 ENCOUNTER — Encounter: Payer: Self-pay | Admitting: Physical Therapy

## 2014-04-19 DIAGNOSIS — Z89512 Acquired absence of left leg below knee: Secondary | ICD-10-CM | POA: Insufficient documentation

## 2014-04-19 DIAGNOSIS — R269 Unspecified abnormalities of gait and mobility: Secondary | ICD-10-CM | POA: Diagnosis not present

## 2014-04-19 DIAGNOSIS — Z89511 Acquired absence of right leg below knee: Secondary | ICD-10-CM | POA: Diagnosis not present

## 2014-04-19 DIAGNOSIS — R6889 Other general symptoms and signs: Secondary | ICD-10-CM

## 2014-04-20 NOTE — Therapy (Signed)
[facility] [address] [place], [place], [address] Phone: [phone]   Fax:  [phone]  Physical Therapy Treatment  Patient Details  Name: [name] MRN: [id number] Date of Birth: 04/11/1955 Referring Provider:  [name], MD  Encounter Date: 04/19/2014      PT End of Session - 04/19/14 1400    Visit Number 6   Number of Visits 9   Date for PT Re-Evaluation 04/03/14   PT Start Time 4650   PT Stop Time 1450   PT Time Calculation (min) 38 min   Equipment Utilized During Treatment Gait belt   Activity Tolerance Patient tolerated treatment well   Behavior During Therapy [facility] for tasks assessed/performed      Past Medical History  Diagnosis Date  . Hypertension   . Neuropathy   . Peripheral vascular disease   . Coronary artery disease   . Anxiety   . Depression   . Anemia   . Type II diabetes mellitus   . High cholesterol   . History of blood transfusion     "one time; related to diabetes I guess"   . Arthritis     "hands"  . GERD (gastroesophageal reflux disease)     takes Zantac  . Complication of anesthesia     pt felt like she had a hard time waking up after surgery in Feb. 2015.  Marland Kitchen CHF (congestive heart failure)   . Seizures     Past Surgical History  Procedure Laterality Date  . Toe amputation Right     "took off a couple toes before the amputation"  . Below knee leg amputation Right Jan. 2008  . Carotid endarterectomy Right   . Cholecystectomy    . Ankle fusion Left 05/11/2013    TIBIOCALCANEAL FUSION   . Tonsillectomy    . Dilation and curettage of uterus    . Coronary artery bypass graft  12/01/2007    "CABG X4" (05/12/2013)  . Ankle fusion Left 05/12/2013    Procedure: LEFT TIBIOCALCANEAL FUSION;  Surgeon: Newt Minion, MD;  Location: [place];  Service: Orthopedics;  Laterality: Left;  Left Tibiocalcaneal Fusion  . Colonoscopy    . Irrigation and debridement abscess Left  06/17/2013    ANKLE           DR [name]  . I&d extremity Left 06/17/2013    Procedure: IRRIGATION AND DEBRIDEMENT EXTREMITY, PLACEMENT ANTIBIOTIC STIMULAN BEADS;  Surgeon: Newt Minion, MD;  Location: [place];  Service: Orthopedics;  Laterality: Left;  . Application of wound vac  06/17/2013    Procedure: APPLICATION OF WOUND VAC;  Surgeon: Newt Minion, MD;  Location: [place];  Service: Orthopedics;;  . Amputation Left 08/22/2013    Procedure: LEFT BELOW KNEE AMPUTATION;  Surgeon: Newt Minion, MD;  Location: [place];  Service: Orthopedics;  Laterality: Left;    There were no vitals taken for this visit.  Visit Diagnosis:  Activity intolerance  Abnormality of gait  Status post bilateral below knee amputation                  [facility] Adult PT Treatment/Exercise - 04/20/14 0001    Transfers   Sit to Stand --   Stand to Sit --   Ambulation/Gait   Ambulation/Gait --   Ambulation/Gait Assistance --   Ambulation/Gait Assistance Details --   Ambulation Distance (Feet) --   Assistive device --   Gait Pattern --   Ambulation  Surface --   Prosthetics   Current prosthetic wear tolerance (days/week)  --   Current prosthetic wear tolerance (#hours/day)  --   Residual limb condition  --   Education Provided --   Person(s) Educated --   Education Method --   Education Method --   Donning Prosthesis --   Doffing Prosthesis --                PT Education - 04/19/14 1400    Education provided Yes   Education Details wound care, increasing activity level first with frequency of changing locations in home via gait with RW over wc, then progress to entering/exiting home via gait with RW, then progress to initiating entering /exiting community buildings with family following with wc once fatigued.   Person(s) Educated Patient;Spouse   Methods Explanation   Comprehension Verbalized understanding          PT Short Term Goals - 03/07/14 1445    PT SHORT TERM GOAL #1   Title  verbalizes proper residual limb care with prostheses (Target Date: 03/03/14)   Baseline NOT MET as not seen since evaluation: 03/07/14   Time 4   Period Weeks   Status New   PT SHORT TERM GOAL #2   Title performs standing balance with walker support reaching >10" & managing clothes modified independent.  (Target Date: 03/03/14)   Baseline NOT MET as not seen since evaluation: 03/07/14   Time 4   Period Weeks   Status New   PT SHORT TERM GOAL #3   Title ambulates 300' with rolling walker & prostheses with supervision.  (Target Date: 03/03/14)   Baseline NOT MET as not seen since evaluation: 03/07/14   Time 4   Period Weeks   Status New   PT SHORT TERM GOAL #4   Title negotiate ramp, curb & stairs (2 rails) with rolling walker & prostheses with supervision.  (Target Date: 03/03/14)   Baseline NOT MET as not seen since evaluation: 03/07/14   Time 4   Period Weeks   Status New           PT Long Term Goals - 03/29/14 1500    PT LONG TERM GOAL #1   Title Patient is independent with all aspects of prosthetic care.  (New Target Date: 04/28/14)   Baseline 03/29/14 Patient requires cues for residual limb management & adjusting ply socks.   Time 8   Period Weeks   Status On-going   PT LONG TERM GOAL #2   Title Patient tolerates wear of bilateral prostheses >90% of awake hours with skin issues or tenderness.   (New Target Date: 04/28/14)   Baseline NOT MET 03/29/14 has wound on left limb.    Time 8   Period Weeks   Status New   PT LONG TERM GOAL #3   Title patient ambulates 500' with LRAD & prostheses with family supervision.   (New Target Date: 04/28/14)   Baseline NOT MET 03/29/14 Patient ambulates 130' with rollator walker & prostheses with supervision /cues from PT.    Status On-going   PT LONG TERM GOAL #4   Title patient ambulates around household furniture carrrying plate /cup with LRAD & prostheses 100'  modified independent.  (New Target Date: 04/28/14)   Baseline NOT MET  patient requires supervision & cues from PT   Time 8   Period Weeks   Status On-going   PT LONG TERM GOAL #5   Title Patient negotiates ramp, curb &   stairs with LRAD & prostheses with family supervision.  (New Target Date: 04/28/14)   Baseline 03/29/14 NOT MET Patient requires minimal assist & cues from PT.   Time 8   Period Weeks   Status On-going   PT LONG TERM GOAL #6   Title standing balance: without UE support maintains upright 60 seconds safely; reaches 10", to floor & in upper/lower cabinets with light UE support modified independent.   (New Target Date: 04/28/14)   Baseline NOT MET 03/29/14   Time 8   Period Weeks   Status On-going               Plan - 04/19/14 1445    Clinical Impression Statement Patient and husband appear to understand instruction to increase activity level without overdoing it.    Pt will benefit from skilled therapeutic intervention in order to improve on the following deficits Abnormal gait;Decreased activity tolerance;Decreased balance;Decreased knowledge of use of DME;Decreased mobility;Decreased strength;Other (comment)  Prosthetic dependency   Rehab Potential Good   PT Frequency 1x / week   PT Duration 4 weeks   PT Treatment/Interventions ADLs/Self Care Home Management;DME Instruction;Gait training;Stair training;Functional mobility training;Therapeutic activities;Therapeutic exercise;Balance training;Neuromuscular re-education;Patient/family education;Other (comment)  prosthetic training   PT Next Visit Plan Assess LTGs   Consulted and Agree with Plan of Care Patient;Family member/caregiver   Family Member Consulted husband        Problem List Patient Active Problem List   Diagnosis Date Noted  . Localization-related (focal) (partial) epilepsy and epileptic syndromes with complex partial seizures, with intractable epilepsy 10/30/2013  . Left frontal lobe mass 10/30/2013  . Status epilepticus 09/02/2013  . Acute encephalopathy 09/02/2013   . Seizure 08/29/2013  . Accelerated hypertension 08/29/2013  . Osteomyelitis 08/21/2013  . Atherosclerosis of native arteries of the extremities with ulceration(440.23) 07/14/2013  . Protein-calorie malnutrition, severe 06/20/2013  . Osteomyelitis of ankle or foot, left, acute 06/17/2013  . Charcot foot due to diabetes mellitus 05/13/2013  . PVD (peripheral vascular disease) 03/25/2012  . Atherosclerosis of native arteries of the extremities with intermittent claudication 09/16/2011  . DIABETES MELLITUS 03/14/2008  . HYPERLIPIDEMIA 03/14/2008  . ANEMIA OF CHRONIC DISEASE 03/14/2008  . ANXIETY DEPRESSION 03/14/2008  . HYPERTENSION, BENIGN 03/14/2008  . CAD, ARTERY BYPASS GRAFT 03/14/2008  . CAROTID STENOSIS 03/14/2008  . PVD 03/14/2008  . BRUIT 03/14/2008  . SEIZURES, HX OF 03/14/2008  . CAROTID ENDARTERECTOMY, RIGHT, HX OF 03/14/2008  . CHOLECYSTECTOMY, LAPAROSCOPIC, HX OF 03/14/2008    WALDRON,ROBIN PT, DPT 04/20/2014, 8:00 AM  [facility] [address] [place], [place], [address] Phone: [phone]   Fax:  [phone]      

## 2014-04-26 ENCOUNTER — Ambulatory Visit: Payer: Medicare Other | Admitting: Physical Therapy

## 2014-05-04 ENCOUNTER — Ambulatory Visit: Payer: Medicare Other | Admitting: Physical Therapy

## 2014-05-10 ENCOUNTER — Encounter: Payer: Self-pay | Admitting: Physical Therapy

## 2014-05-10 ENCOUNTER — Ambulatory Visit: Payer: Medicare Other | Admitting: Physical Therapy

## 2014-05-10 DIAGNOSIS — Z7409 Other reduced mobility: Secondary | ICD-10-CM

## 2014-05-10 DIAGNOSIS — Z89512 Acquired absence of left leg below knee: Secondary | ICD-10-CM

## 2014-05-10 DIAGNOSIS — R6889 Other general symptoms and signs: Secondary | ICD-10-CM | POA: Diagnosis not present

## 2014-05-10 DIAGNOSIS — R269 Unspecified abnormalities of gait and mobility: Secondary | ICD-10-CM

## 2014-05-10 DIAGNOSIS — Z89511 Acquired absence of right leg below knee: Secondary | ICD-10-CM

## 2014-05-10 DIAGNOSIS — R2689 Other abnormalities of gait and mobility: Secondary | ICD-10-CM

## 2014-05-10 NOTE — Therapy (Signed)
[facility] [address] [place], [place], [address] Phone: [phone]   Fax:  [phone]  Physical Therapy Treatment  Patient Details  Name: [name] MRN: [id number] Date of Birth: 07/12/1955 Referring Provider:  [name], MD  Encounter Date: 05/10/2014      PT End of Session - 05/10/14 1445    Visit Number 7   Number of Visits 15   Date for PT Re-Evaluation 06/30/14   PT Start Time 4827   PT Stop Time 1445   PT Time Calculation (min) 30 min   Equipment Utilized During Treatment Gait belt   Activity Tolerance Patient tolerated treatment well   Behavior During Therapy [facility] for tasks assessed/performed      Past Medical History  Diagnosis Date  . Hypertension   . Neuropathy   . Peripheral vascular disease   . Coronary artery disease   . Anxiety   . Depression   . Anemia   . Type II diabetes mellitus   . High cholesterol   . History of blood transfusion     "one time; related to diabetes I guess"   . Arthritis     "hands"  . GERD (gastroesophageal reflux disease)     takes Zantac  . Complication of anesthesia     pt felt like she had a hard time waking up after surgery in Feb. 2015.  Marland Kitchen CHF (congestive heart failure)   . Seizures     Past Surgical History  Procedure Laterality Date  . Toe amputation Right     "took off a couple toes before the amputation"  . Below knee leg amputation Right Jan. 2008  . Carotid endarterectomy Right   . Cholecystectomy    . Ankle fusion Left 05/11/2013    TIBIOCALCANEAL FUSION   . Tonsillectomy    . Dilation and curettage of uterus    . Coronary artery bypass graft  12/01/2007    "CABG X4" (05/12/2013)  . Ankle fusion Left 05/12/2013    Procedure: LEFT TIBIOCALCANEAL FUSION;  Surgeon: Newt Minion, MD;  Location: [place];  Service: Orthopedics;  Laterality: Left;  Left Tibiocalcaneal Fusion  . Colonoscopy    . Irrigation and debridement abscess Left  06/17/2013    ANKLE           DR [name]  . I&d extremity Left 06/17/2013    Procedure: IRRIGATION AND DEBRIDEMENT EXTREMITY, PLACEMENT ANTIBIOTIC STIMULAN BEADS;  Surgeon: Newt Minion, MD;  Location: [place];  Service: Orthopedics;  Laterality: Left;  . Application of wound vac  06/17/2013    Procedure: APPLICATION OF WOUND VAC;  Surgeon: Newt Minion, MD;  Location: [place];  Service: Orthopedics;;  . Amputation Left 08/22/2013    Procedure: LEFT BELOW KNEE AMPUTATION;  Surgeon: Newt Minion, MD;  Location: [place];  Service: Orthopedics;  Laterality: Left;    There were no vitals taken for this visit.  Visit Diagnosis:  Impaired functional mobility and activity tolerance  Abnormality of gait  Status post bilateral below knee amputation  Balance problems      Subjective Assessment - 05/10/14 2023    Symptoms Patient reports wound has healed. She is wearing prostheses most of out of bed hours but stays in bed a lot.   Currently in Pain? No/denies     Prosthetic Training: PT assessed left residual limb and wound completely healed. Patient ambulated 150' with rolling walker & prostheses with contact assist.  PT instructed  patient and husband in adjusting ply socks. Patient arrived with 0-ply socks on bilateral prostheses but was seated too deep in sockets. 3-ply added to left limb and 5-ply to right limb. Patient ambulated 4' with RW including 180 degree turn with focus on perceiving fit of prostheses with ply socks.                        PT Education - 05/10/14 2024    Education provided Yes   Education Details adjusting ply socks   Person(s) Educated Patient;Spouse   Methods Explanation;Demonstration   Comprehension Verbalized understanding;Returned demonstration;Need further instruction          PT Short Term Goals - 05/10/14 1445    PT SHORT TERM GOAL #1   Title verbalizes proper residual limb care with prostheses (NEW Target Date: 06/02/14)   Time 4    Period Weeks   Status On-going   PT SHORT TERM GOAL #2   Title performs standing balance with walker support reaching >10" & managing clothes modified independent.  (NEW Target Date: 06/02/14)   Time 4   Period Weeks   Status On-going   PT SHORT TERM GOAL #3   Title ambulates 300' with rolling walker & prostheses with supervision. (NEW Target Date: 06/02/14)   Time 4   Period Weeks   Status On-going   PT SHORT TERM GOAL #4   Title negotiate ramp, curb & stairs (2 rails) with rolling walker & prostheses with supervision (NEW Target Date: 06/02/14)   Time 4   Period Weeks   Status On-going           PT Long Term Goals - 05/10/14 1445    PT LONG TERM GOAL #1   Title Patient is independent with all aspects of prosthetic care.  (New Target Date: 06/30/14)   Baseline 05/10/14 Patient requires cues for wear schedule & adjusting ply socks.   Time 8   Period Weeks   Status On-going   PT LONG TERM GOAL #2   Title Patient tolerates wear of bilateral prostheses >90% of awake hours with skin issues or tenderness.   (New Target Date: 06/30/14)   Baseline Partially MET 05/10/14 Patient's wound on left limb has healed. She reports wear >90% of out of bed hours but spends a lot of time in bed.   Time 8   Period Weeks   Status New   PT LONG TERM GOAL #3   Title patient ambulates 500' with LRAD & prostheses with family supervision.  (New Target Date: 06/30/14)   Baseline NOT MET 05/10/14 Patient ambulates 32' with rollator walker & prostheses with supervision /cues from PT.    Status On-going   PT LONG TERM GOAL #4   Title patient ambulates around household furniture carrrying plate /cup with LRAD & prostheses 20'  modified independent. (New Target Date: 06/30/14)   Baseline NOT MET Patient is not safe to ambulate without RW support at this time.   Time 8   Period Weeks   Status On-going   PT LONG TERM GOAL #5   Title Patient negotiates ramp, curb & stairs with LRAD & prostheses with family  supervision. (New Target Date: 06/30/14)   Baseline 05/10/14 NOT MET Patient requires minimal assist & cues from PT with RW for ramp & curb.   Time 8   Period Weeks   Status On-going   PT LONG TERM GOAL #6   Title standing balance: without UE support maintains  upright 60 seconds safely; reaches 10", to floor & in upper/lower cabinets with light UE support modified independent. (New Target Date: 06/30/14)   Baseline NOT MET 05/10/14   Time 8   Period Weeks   Status On-going               Plan - 05/10/14 1445    Clinical Impression Statement Patient has been seen only 7 visits including evaluation on 02/01/14 and has been late to numerous of these appointments. Patient has made limited progress but has improved. She could benefit from additional PT to improve mobility & safety with bilateral prostheses. See LTGs for progress.                Pt will benefit from skilled therapeutic intervention in order to improve on the following deficits Abnormal gait;Decreased activity tolerance;Decreased balance;Decreased knowledge of use of DME;Decreased mobility;Decreased strength;Other (comment)  Prosthetic dependency   Rehab Potential Good   PT Frequency 1x / week   PT Duration 8 weeks   PT Treatment/Interventions ADLs/Self Care Home Management;DME Instruction;Gait training;Stair training;Functional mobility training;Therapeutic activities;Therapeutic exercise;Balance training;Neuromuscular re-education;Patient/family education;Other (comment)  prosthetic training   PT Next Visit Plan stairs with 1 rail & 1 crutch, ramp & curb with RW   Consulted and Agree with Plan of Care Patient;Family member/caregiver   Family Member Consulted husband        Problem List Patient Active Problem List   Diagnosis Date Noted  . Localization-related (focal) (partial) epilepsy and epileptic syndromes with complex partial seizures, with intractable epilepsy 10/30/2013  . Left frontal lobe mass 10/30/2013  .  Status epilepticus 09/02/2013  . Acute encephalopathy 09/02/2013  . Seizure 08/29/2013  . Accelerated hypertension 08/29/2013  . Osteomyelitis 08/21/2013  . Atherosclerosis of native arteries of the extremities with ulceration(440.23) 07/14/2013  . Protein-calorie malnutrition, severe 06/20/2013  . Osteomyelitis of ankle or foot, left, acute 06/17/2013  . Charcot foot due to diabetes mellitus 05/13/2013  . PVD (peripheral vascular disease) 03/25/2012  . Atherosclerosis of native arteries of the extremities with intermittent claudication 09/16/2011  . DIABETES MELLITUS 03/14/2008  . HYPERLIPIDEMIA 03/14/2008  . ANEMIA OF CHRONIC DISEASE 03/14/2008  . ANXIETY DEPRESSION 03/14/2008  . HYPERTENSION, BENIGN 03/14/2008  . CAD, ARTERY BYPASS GRAFT 03/14/2008  . CAROTID STENOSIS 03/14/2008  . PVD 03/14/2008  . BRUIT 03/14/2008  . SEIZURES, HX OF 03/14/2008  . CAROTID ENDARTERECTOMY, RIGHT, HX OF 03/14/2008  . CHOLECYSTECTOMY, LAPAROSCOPIC, HX OF 03/14/2008    Jamey Reas PT, DPT 05/10/2014, 8:49 PM  [facility] [address] [place], [place], [address] Phone: [phone]   Fax:  [phone]

## 2014-05-10 NOTE — Addendum Note (Signed)
Addended by: Isaias Cowman on: 05/10/2014 09:01 PM   Modules accepted: Orders

## 2014-05-11 ENCOUNTER — Ambulatory Visit: Payer: Medicare Other | Admitting: Vascular Surgery

## 2014-05-11 ENCOUNTER — Other Ambulatory Visit (HOSPITAL_COMMUNITY): Payer: Medicare Other

## 2014-05-11 ENCOUNTER — Encounter (HOSPITAL_COMMUNITY): Payer: Medicare Other

## 2014-05-31 ENCOUNTER — Ambulatory Visit: Payer: Medicare Other | Attending: Orthopedic Surgery | Admitting: Physical Therapy

## 2014-05-31 ENCOUNTER — Encounter: Payer: Self-pay | Admitting: Physical Therapy

## 2014-05-31 DIAGNOSIS — Z89511 Acquired absence of right leg below knee: Secondary | ICD-10-CM | POA: Insufficient documentation

## 2014-05-31 DIAGNOSIS — R6889 Other general symptoms and signs: Secondary | ICD-10-CM | POA: Diagnosis not present

## 2014-05-31 DIAGNOSIS — Z89512 Acquired absence of left leg below knee: Secondary | ICD-10-CM

## 2014-05-31 DIAGNOSIS — R269 Unspecified abnormalities of gait and mobility: Secondary | ICD-10-CM

## 2014-05-31 DIAGNOSIS — Z7409 Other reduced mobility: Secondary | ICD-10-CM

## 2014-05-31 DIAGNOSIS — R2689 Other abnormalities of gait and mobility: Secondary | ICD-10-CM

## 2014-05-31 NOTE — Therapy (Signed)
[facility] [address] [place], [place], [address] Phone: [phone]   Fax:  [phone]  Physical Therapy Treatment  Patient Details  Name: [name] MRN: [id number] Date of Birth: 05-22-1955 Referring Provider:  [name], MD  Encounter Date: 05/31/2014      PT End of Session - 05/31/14 1445    Visit Number 8   Number of Visits 15   Date for PT Re-Evaluation 06/30/14   PT Start Time 8453   PT Stop Time 1445   PT Time Calculation (min) 40 min   Equipment Utilized During Treatment Gait belt   Activity Tolerance Patient tolerated treatment well   Behavior During Therapy [facility] for tasks assessed/performed      Past Medical History  Diagnosis Date  . Hypertension   . Neuropathy   . Peripheral vascular disease   . Coronary artery disease   . Anxiety   . Depression   . Anemia   . Type II diabetes mellitus   . High cholesterol   . History of blood transfusion     "one time; related to diabetes I guess"   . Arthritis     "hands"  . GERD (gastroesophageal reflux disease)     takes Zantac  . Complication of anesthesia     pt felt like she had a hard time waking up after surgery in Feb. 2015.  Marland Kitchen CHF (congestive heart failure)   . Seizures     Past Surgical History  Procedure Laterality Date  . Toe amputation Right     "took off a couple toes before the amputation"  . Below knee leg amputation Right Jan. 2008  . Carotid endarterectomy Right   . Cholecystectomy    . Ankle fusion Left 05/11/2013    TIBIOCALCANEAL FUSION   . Tonsillectomy    . Dilation and curettage of uterus    . Coronary artery bypass graft  12/01/2007    "CABG X4" (05/12/2013)  . Ankle fusion Left 05/12/2013    Procedure: LEFT TIBIOCALCANEAL FUSION;  Surgeon: Newt Minion, MD;  Location: [place];  Service: Orthopedics;  Laterality: Left;  Left Tibiocalcaneal Fusion  . Colonoscopy    . Irrigation and debridement abscess Left  06/17/2013    ANKLE           DR [name]  . I&d extremity Left 06/17/2013    Procedure: IRRIGATION AND DEBRIDEMENT EXTREMITY, PLACEMENT ANTIBIOTIC STIMULAN BEADS;  Surgeon: Newt Minion, MD;  Location: [place];  Service: Orthopedics;  Laterality: Left;  . Application of wound vac  06/17/2013    Procedure: APPLICATION OF WOUND VAC;  Surgeon: Newt Minion, MD;  Location: [place];  Service: Orthopedics;;  . Amputation Left 08/22/2013    Procedure: LEFT BELOW KNEE AMPUTATION;  Surgeon: Newt Minion, MD;  Location: [place];  Service: Orthopedics;  Laterality: Left;    There were no vitals filed for this visit.  Visit Diagnosis:  Abnormality of gait  Status post bilateral below knee amputation  Balance problems  Impaired functional mobility and activity tolerance                     [facility] Adult PT Treatment/Exercise - 05/31/14 1400    Transfers   Sit to Stand 4: Min guard;With upper extremity assist;With armrests;From chair/3-in-1;5: Supervision   Stand to Sit 4: Min guard;With upper extremity assist;With armrests;To chair/3-in-1;5: Supervision   Ambulation/Gait   Ambulation/Gait Yes   Ambulation/Gait Assistance  4: Min assist;4: Min guard;5: Supervision   Ambulation/Gait Assistance Details cues on posture, rollator control   Ambulation Distance (Feet) 25 Feet  25, 30, & 40 around furniture no device, 200' X 3 w/ rollato   Assistive device Rollator;Prostheses   Gait Pattern Step-through pattern;Trunk flexed   Ambulation Surface Level;Unlevel;Indoor;Outdoor;Paved;Gravel;Grass   Ramp 4: Min assist  rollator   Curb 4: Min assist  rollator   Prosthetics   Current prosthetic wear tolerance (days/week)  7 days/wk   Current prosthetic wear tolerance (#hours/day)  all awake hours for both prostheses   Residual limb condition  no open areas   Education Provided Skin check;Proper wear schedule/adjustment   Person(s) Educated Patient;Spouse;Child(ren)   Education Method Explanation    Education Method Verbalized understanding   Donning Prosthesis Modified independent (device/increased time)   Doffing Prosthesis Modified independent (device/increased time)                  PT Short Term Goals - 05/31/14 1445    PT SHORT TERM GOAL #1   Title verbalizes proper residual limb care with prostheses ([employer] Date: 06/02/14)   Baseline MET 05/31/14   Time 4   Period Weeks   Status Achieved   PT SHORT TERM GOAL #2   Title performs standing balance with walker support reaching >10" & managing clothes modified independent.  ([employer] Date: 06/02/14)   Baseline MET 05/31/14   Time 4   Period Weeks   Status Achieved   PT SHORT TERM GOAL #3   Title ambulates 300' with rolling walker & prostheses with supervision. ([employer] Date: 06/02/14)   Baseline partially MET 05/31/14   Time 4   Period Weeks   Status Partially Met   PT SHORT TERM GOAL #4   Title negotiate ramp, curb & stairs (2 rails) with rolling walker & prostheses with supervision ([employer] Date: 06/02/14)   Baseline partially MET 05/31/14   Time 4   Period Weeks   Status Partially Met           PT Long Term Goals - 05/10/14 1445    PT LONG TERM GOAL #1   Title Patient is independent with all aspects of prosthetic care.  ([employer] Date: 06/30/14)   Baseline 05/10/14 Patient requires cues for wear schedule & adjusting ply socks.   Time 8   Period Weeks   Status On-going   PT LONG TERM GOAL #2   Title Patient tolerates wear of bilateral prostheses >90% of awake hours with skin issues or tenderness.   ([employer] Date: 06/30/14)   Baseline Partially MET 05/10/14 Patient's wound on left limb has healed. She reports wear >90% of out of bed hours but spends a lot of time in bed.   Time 8   Period Weeks   Status New   PT LONG TERM GOAL #3   Title patient ambulates 500' with LRAD & prostheses with family supervision.  ([employer] Date: 06/30/14)   Baseline NOT MET 05/10/14 Patient ambulates 59' with  rollator walker & prostheses with supervision /cues from PT.    Status On-going   PT LONG TERM GOAL #4   Title patient ambulates around household furniture carrrying plate /cup with LRAD & prostheses 20'  modified independent. ([employer] Date: 06/30/14)   Baseline NOT MET Patient is not safe to ambulate without RW support at this time.   Time 8   Period Weeks   Status On-going   PT LONG TERM GOAL #  5   Title Patient negotiates ramp, curb & stairs with LRAD & prostheses with family supervision. ([employer] Date: 06/30/14)   Baseline 05/10/14 NOT MET Patient requires minimal assist & cues from PT with RW for ramp & curb.   Time 8   Period Weeks   Status On-going   PT LONG TERM GOAL #6   Title standing balance: without UE support maintains upright 60 seconds safely; reaches 10", to floor & in upper/lower cabinets with light UE support modified independent. ([employer] Date: 06/30/14)   Baseline NOT MET 05/10/14   Time 8   Period Weeks   Status On-going               Plan - 05/31/14 1445    Clinical Impression Statement Patient's balance was improved today. She was able to ambulate limited distances up to 69' with only prostheses. She needs a rollator walker for community mobility.   Pt will benefit from skilled therapeutic intervention in order to improve on the following deficits Abnormal gait;Decreased activity tolerance;Decreased balance;Decreased knowledge of use of DME;Decreased mobility;Decreased strength;Other (comment)   Rehab Potential Good   PT Frequency 1x / week   PT Duration 8 weeks   PT Treatment/Interventions ADLs/Self Care Home Management;DME Instruction;Gait training;Stair training;Functional mobility training;Therapeutic activities;Therapeutic exercise;Balance training;Neuromuscular re-education;Patient/family education;Other (comment)   PT Next Visit Plan household without device, community with rollator walker   Recommended Other Services rollator walker     Consulted and Agree with Plan of Care Patient;Family member/caregiver   Family Member Consulted husband & 2 sons        Problem List Patient Active Problem List   Diagnosis Date Noted  . Localization-related (focal) (partial) epilepsy and epileptic syndromes with complex partial seizures, with intractable epilepsy 10/30/2013  . Left frontal lobe mass 10/30/2013  . Status epilepticus 09/02/2013  . Acute encephalopathy 09/02/2013  . Seizure 08/29/2013  . Accelerated hypertension 08/29/2013  . Osteomyelitis 08/21/2013  . Atherosclerosis of native arteries of the extremities with ulceration(440.23) 07/14/2013  . Protein-calorie malnutrition, severe 06/20/2013  . Osteomyelitis of ankle or foot, left, acute 06/17/2013  . Charcot foot due to diabetes mellitus 05/13/2013  . PVD (peripheral vascular disease) 03/25/2012  . Atherosclerosis of native arteries of the extremities with intermittent claudication 09/16/2011  . DIABETES MELLITUS 03/14/2008  . HYPERLIPIDEMIA 03/14/2008  . ANEMIA OF CHRONIC DISEASE 03/14/2008  . ANXIETY DEPRESSION 03/14/2008  . HYPERTENSION, BENIGN 03/14/2008  . CAD, ARTERY BYPASS GRAFT 03/14/2008  . CAROTID STENOSIS 03/14/2008  . PVD 03/14/2008  . BRUIT 03/14/2008  . SEIZURES, HX OF 03/14/2008  . CAROTID ENDARTERECTOMY, RIGHT, HX OF 03/14/2008  . CHOLECYSTECTOMY, LAPAROSCOPIC, HX OF 03/14/2008    Jamey Reas PT, DPT 05/31/2014, 10:26 PM  [facility] [address] [place], [place], [address] Phone: [phone]   Fax:  [phone]

## 2014-06-07 ENCOUNTER — Ambulatory Visit: Payer: Medicare Other | Admitting: Physical Therapy

## 2014-06-07 ENCOUNTER — Encounter: Payer: Self-pay | Admitting: Physical Therapy

## 2014-06-07 DIAGNOSIS — Z89511 Acquired absence of right leg below knee: Secondary | ICD-10-CM

## 2014-06-07 DIAGNOSIS — Z89512 Acquired absence of left leg below knee: Secondary | ICD-10-CM

## 2014-06-07 DIAGNOSIS — Z7409 Other reduced mobility: Secondary | ICD-10-CM

## 2014-06-07 DIAGNOSIS — R6889 Other general symptoms and signs: Secondary | ICD-10-CM | POA: Diagnosis not present

## 2014-06-07 DIAGNOSIS — R269 Unspecified abnormalities of gait and mobility: Secondary | ICD-10-CM

## 2014-06-07 NOTE — Therapy (Signed)
[facility] [address] [place], [place], [address] Phone: [phone]   Fax:  [phone]  Physical Therapy Treatment  Patient Details  Name: [name] MRN: [id number] Date of Birth: [birth year] Referring Provider:  [name], MD  Encounter Date: 06/07/2014      PT End of Session - 06/07/14 1330    Visit Number 9   Number of Visits 15   Date for PT Re-Evaluation 06/30/14   PT Start Time 1330   PT Stop Time 1400   PT Time Calculation (min) 30 min   Equipment Utilized During Treatment Gait belt   Activity Tolerance Patient tolerated treatment well   Behavior During Therapy [facility] for tasks assessed/performed      Past Medical History  Diagnosis Date  . Hypertension   . Neuropathy   . Peripheral vascular disease   . Coronary artery disease   . Anxiety   . Depression   . Anemia   . Type II diabetes mellitus   . High cholesterol   . History of blood transfusion     "one time; related to diabetes I guess"   . Arthritis     "hands"  . GERD (gastroesophageal reflux disease)     takes Zantac  . Complication of anesthesia     pt felt like she had a hard time waking up after surgery in Feb. 2015.  Marland Kitchen CHF (congestive heart failure)   . Seizures     Past Surgical History  Procedure Laterality Date  . Toe amputation Right     "took off a couple toes before the amputation"  . Below knee leg amputation Right Jan. 2008  . Carotid endarterectomy Right   . Cholecystectomy    . Ankle fusion Left 05/11/2013    TIBIOCALCANEAL FUSION   . Tonsillectomy    . Dilation and curettage of uterus    . Coronary artery bypass graft  12/01/2007    "CABG X4" (05/12/2013)  . Ankle fusion Left 05/12/2013    Procedure: LEFT TIBIOCALCANEAL FUSION;  Surgeon: Newt Minion, MD;  Location: [place];  Service: Orthopedics;  Laterality: Left;  Left Tibiocalcaneal Fusion  . Colonoscopy    . Irrigation and debridement abscess Left  06/17/2013    ANKLE           DR [name]  . I&d extremity Left 06/17/2013    Procedure: IRRIGATION AND DEBRIDEMENT EXTREMITY, PLACEMENT ANTIBIOTIC STIMULAN BEADS;  Surgeon: Newt Minion, MD;  Location: [place];  Service: Orthopedics;  Laterality: Left;  . Application of wound vac  06/17/2013    Procedure: APPLICATION OF WOUND VAC;  Surgeon: Newt Minion, MD;  Location: [place];  Service: Orthopedics;;  . Amputation Left 08/22/2013    Procedure: LEFT BELOW KNEE AMPUTATION;  Surgeon: Newt Minion, MD;  Location: [place];  Service: Orthopedics;  Laterality: Left;    There were no vitals filed for this visit.  Visit Diagnosis:  Abnormality of gait  Status post bilateral below knee amputation  Impaired functional mobility and activity tolerance      Subjective Assessment - 06/07/14 1330    Symptoms Picked up rollator walker at [facility] prior to PT   Currently in Pain? No/denies                       [facility] Adult PT Treatment/Exercise - 06/07/14 1330    Transfers   Sit to Stand With upper extremity assist;With armrests;From  chair/3-in-1;5: Supervision   Sit to Stand Details (indicate cue type and reason) cues on instruction safety on brakes   Stand to Sit With upper extremity assist;With armrests;To chair/3-in-1;5: Supervision   Stand to Sit Details cues on safety with brakes   Ambulation/Gait   Ambulation/Gait Yes   Ambulation/Gait Assistance 4: Min guard;5: Supervision   Ambulation/Gait Assistance Details cues on safety with rollator   Ambulation Distance (Feet) 200 Feet  2x   Assistive device Rollator;Prostheses   Gait Pattern Step-through pattern;Trunk flexed   Ambulation Surface Level;Indoor   Stairs Yes   Stairs Assistance 5: Supervision   Stairs Assistance Details (indicate cue type and reason) demo / verbal cues on technique   Stair Management Technique Two rails;Forwards;One rail Left;Sideways   Number of Stairs 4  2 reps   Ramp --   Curb --   Prosthetics    Current prosthetic wear tolerance (days/week)  7 days/wk   Current prosthetic wear tolerance (#hours/day)  all awake hours for both prostheses   Residual limb condition  no open areas                PT Education - 06/07/14 1330    Education provided Yes   Education Details safety with brakes / rollator, driving options with bilateral prostheses   Person(s) Educated Patient;Spouse   Methods Explanation;Demonstration   Comprehension Verbalized understanding;Returned demonstration;Need further instruction          PT Short Term Goals - 05/31/14 1445    PT SHORT TERM GOAL #1   Title verbalizes proper residual limb care with prostheses ([employer] Date: 06/02/14)   Baseline MET 05/31/14   Time 4   Period Weeks   Status Achieved   PT SHORT TERM GOAL #2   Title performs standing balance with walker support reaching >10" & managing clothes modified independent.  ([employer] Date: 06/02/14)   Baseline MET 05/31/14   Time 4   Period Weeks   Status Achieved   PT SHORT TERM GOAL #3   Title ambulates 300' with rolling walker & prostheses with supervision. ([employer] Date: 06/02/14)   Baseline partially MET 05/31/14   Time 4   Period Weeks   Status Partially Met   PT SHORT TERM GOAL #4   Title negotiate ramp, curb & stairs (2 rails) with rolling walker & prostheses with supervision ([employer] Date: 06/02/14)   Baseline partially MET 05/31/14   Time 4   Period Weeks   Status Partially Met           PT Long Term Goals - 05/10/14 1445    PT LONG TERM GOAL #1   Title Patient is independent with all aspects of prosthetic care.  ([employer] Date: 06/30/14)   Baseline 05/10/14 Patient requires cues for wear schedule & adjusting ply socks.   Time 8   Period Weeks   Status On-going   PT LONG TERM GOAL #2   Title Patient tolerates wear of bilateral prostheses >90% of awake hours with skin issues or tenderness.   ([employer] Date: 06/30/14)   Baseline Partially MET 05/10/14 Patient's  wound on left limb has healed. She reports wear >90% of out of bed hours but spends a lot of time in bed.   Time 8   Period Weeks   Status New   PT LONG TERM GOAL #3   Title patient ambulates 500' with LRAD & prostheses with family supervision.  ([employer] Date: 06/30/14)   Baseline NOT MET  05/10/14 Patient ambulates 130' with rollator walker & prostheses with supervision /cues from PT.    Status On-going   PT LONG TERM GOAL #4   Title patient ambulates around household furniture carrrying plate /cup with LRAD & prostheses 20'  modified independent. ([employer] Date: 06/30/14)   Baseline NOT MET Patient is not safe to ambulate without RW support at this time.   Time 8   Period Weeks   Status On-going   PT LONG TERM GOAL #5   Title Patient negotiates ramp, curb & stairs with LRAD & prostheses with family supervision. ([employer] Date: 06/30/14)   Baseline 05/10/14 NOT MET Patient requires minimal assist & cues from PT with RW for ramp & curb.   Time 8   Period Weeks   Status On-going   PT LONG TERM GOAL #6   Title standing balance: without UE support maintains upright 60 seconds safely; reaches 10", to floor & in upper/lower cabinets with light UE support modified independent. ([employer] Date: 06/30/14)   Baseline NOT MET 05/10/14   Time 8   Period Weeks   Status On-going               Plan - 06/07/14 1330    Clinical Impression Statement Patient improved safety with use of rollator with instruction in proper use of brakes. Patient was able to negotiate stairs with 2 rails or 2 hands on one rail with supervision & instructions.   Pt will benefit from skilled therapeutic intervention in order to improve on the following deficits Abnormal gait;Decreased activity tolerance;Decreased balance;Decreased knowledge of use of DME;Decreased mobility;Decreased strength;Other (comment)   Rehab Potential Good   PT Frequency 1x / week   PT Duration 8 weeks   PT Treatment/Interventions ADLs/Self  Care Home Management;DME Instruction;Gait training;Stair training;Functional mobility training;Therapeutic activities;Therapeutic exercise;Balance training;Neuromuscular re-education;Patient/family education;Other (comment)   PT Next Visit Plan household without device, community with rollator walker   Consulted and Agree with Plan of Care Patient;Family member/caregiver   Family Member Consulted husband & 2 sons        Problem List Patient Active Problem List   Diagnosis Date Noted  . Localization-related (focal) (partial) epilepsy and epileptic syndromes with complex partial seizures, with intractable epilepsy 10/30/2013  . Left frontal lobe mass 10/30/2013  . Status epilepticus 09/02/2013  . Acute encephalopathy 09/02/2013  . Seizure 08/29/2013  . Accelerated hypertension 08/29/2013  . Osteomyelitis 08/21/2013  . Atherosclerosis of native arteries of the extremities with ulceration(440.23) 07/14/2013  . Protein-calorie malnutrition, severe 06/20/2013  . Osteomyelitis of ankle or foot, left, acute 06/17/2013  . Charcot foot due to diabetes mellitus 05/13/2013  . PVD (peripheral vascular disease) 03/25/2012  . Atherosclerosis of native arteries of the extremities with intermittent claudication 09/16/2011  . DIABETES MELLITUS 03/14/2008  . HYPERLIPIDEMIA 03/14/2008  . ANEMIA OF CHRONIC DISEASE 03/14/2008  . ANXIETY DEPRESSION 03/14/2008  . HYPERTENSION, BENIGN 03/14/2008  . CAD, ARTERY BYPASS GRAFT 03/14/2008  . CAROTID STENOSIS 03/14/2008  . PVD 03/14/2008  . BRUIT 03/14/2008  . SEIZURES, HX OF 03/14/2008  . CAROTID ENDARTERECTOMY, RIGHT, HX OF 03/14/2008  . CHOLECYSTECTOMY, LAPAROSCOPIC, HX OF 03/14/2008    Jamey Reas PT, DPT 06/07/2014, 10:43 PM  [facility] [address] [place], [place], [address] Phone: [phone]   Fax:  [phone]

## 2014-06-14 ENCOUNTER — Ambulatory Visit: Payer: Medicare Other | Admitting: Physical Therapy

## 2014-06-14 ENCOUNTER — Encounter: Payer: Self-pay | Admitting: Physical Therapy

## 2014-06-14 DIAGNOSIS — Z89511 Acquired absence of right leg below knee: Secondary | ICD-10-CM

## 2014-06-14 DIAGNOSIS — Z7409 Other reduced mobility: Secondary | ICD-10-CM

## 2014-06-14 DIAGNOSIS — Z89512 Acquired absence of left leg below knee: Secondary | ICD-10-CM

## 2014-06-14 DIAGNOSIS — R2689 Other abnormalities of gait and mobility: Secondary | ICD-10-CM

## 2014-06-14 DIAGNOSIS — R6889 Other general symptoms and signs: Secondary | ICD-10-CM | POA: Diagnosis not present

## 2014-06-14 DIAGNOSIS — R269 Unspecified abnormalities of gait and mobility: Secondary | ICD-10-CM

## 2014-06-14 NOTE — Therapy (Signed)
[facility] [address] [place], [place], [address] Phone: [phone]   Fax:  [phone]  Physical Therapy Treatment  Patient Details  Name: [name] MRN: [id number] Date of Birth: [birth year] Referring Provider:  [name], MD  Encounter Date: 06/14/2014      PT End of Session - 06/14/14 1530    Visit Number 10   Number of Visits 15   Date for PT Re-Evaluation 06/30/14   PT Start Time 8315   PT Stop Time 1530   PT Time Calculation (min) 23 min   Equipment Utilized During Treatment Gait belt   Activity Tolerance Patient tolerated treatment well   Behavior During Therapy [facility] for tasks assessed/performed      Past Medical History  Diagnosis Date  . Hypertension   . Neuropathy   . Peripheral vascular disease   . Coronary artery disease   . Anxiety   . Depression   . Anemia   . Type II diabetes mellitus   . High cholesterol   . History of blood transfusion     "one time; related to diabetes I guess"   . Arthritis     "hands"  . GERD (gastroesophageal reflux disease)     takes Zantac  . Complication of anesthesia     pt felt like she had a hard time waking up after surgery in Feb. 2015.  Marland Kitchen CHF (congestive heart failure)   . Seizures     Past Surgical History  Procedure Laterality Date  . Toe amputation Right     "took off a couple toes before the amputation"  . Below knee leg amputation Right Jan. 2008  . Carotid endarterectomy Right   . Cholecystectomy    . Ankle fusion Left 05/11/2013    TIBIOCALCANEAL FUSION   . Tonsillectomy    . Dilation and curettage of uterus    . Coronary artery bypass graft  12/01/2007    "CABG X4" (05/12/2013)  . Ankle fusion Left 05/12/2013    Procedure: LEFT TIBIOCALCANEAL FUSION;  Surgeon: Newt Minion, MD;  Location: [place];  Service: Orthopedics;  Laterality: Left;  Left Tibiocalcaneal Fusion  . Colonoscopy    . Irrigation and debridement abscess Left  06/17/2013    ANKLE           DR [name]  . I&d extremity Left 06/17/2013    Procedure: IRRIGATION AND DEBRIDEMENT EXTREMITY, PLACEMENT ANTIBIOTIC STIMULAN BEADS;  Surgeon: Newt Minion, MD;  Location: [place];  Service: Orthopedics;  Laterality: Left;  . Application of wound vac  06/17/2013    Procedure: APPLICATION OF WOUND VAC;  Surgeon: Newt Minion, MD;  Location: [place];  Service: Orthopedics;;  . Amputation Left 08/22/2013    Procedure: LEFT BELOW KNEE AMPUTATION;  Surgeon: Newt Minion, MD;  Location: [place];  Service: Orthopedics;  Laterality: Left;    There were no vitals filed for this visit.  Visit Diagnosis:  Abnormality of gait  Status post bilateral below knee amputation  Impaired functional mobility and activity tolerance  Balance problems      Subjective Assessment - 06/14/14 1510    Symptoms wearing prostheses all awake hours no issues. Using rollator with family and only issue is grass / entrance to home.   Currently in Pain? No/denies                       [facility] Adult PT Treatment/Exercise - 06/14/14 1445  Transfers   Sit to Stand With upper extremity assist;With armrests;From chair/3-in-1;5: Supervision   Stand to Sit With upper extremity assist;With armrests;To chair/3-in-1;5: Supervision   Ambulation/Gait   Ambulation/Gait Yes   Ambulation/Gait Assistance 4: Min guard;5: Supervision   Ambulation Distance (Feet) 50 Feet  3x   Assistive device Prostheses   Gait Pattern Step-through pattern;Trunk flexed   Ambulation Surface Level;Indoor   Stairs Yes   Stairs Assistance 5: Supervision   Stairs Assistance Details (indicate cue type and reason) cues on technique   Stair Management Technique One rail Left;Sideways  with prostheses   Number of Stairs 4  2 reps   Prosthetics   Current prosthetic wear tolerance (days/week)  7 days/wk   Current prosthetic wear tolerance (#hours/day)  all awake hours for both prostheses   Residual limb condition  no  open areas     Step-up on 4" block with RW alternating lead limb - 3 reps each Patient instruction while sitting on 24" bar stool to increase back endurance & strength.            PT Education - 06/14/14 1530    Education provided Yes   Education Details increasing activity level without overdoing it so she has to limit activities for next 1-2 days. Using 1/2 cinder block (4") with rolling walker to work on strength for step-up alternating lead limb   Person(s) Educated Patient;Spouse   Methods Explanation;Demonstration   Comprehension Verbalized understanding;Returned demonstration          PT Short Term Goals - 05/31/14 1445    PT SHORT TERM GOAL #1   Title verbalizes proper residual limb care with prostheses ([employer] Date: 06/02/14)   Baseline MET 05/31/14   Time 4   Period Weeks   Status Achieved   PT SHORT TERM GOAL #2   Title performs standing balance with walker support reaching >10" & managing clothes modified independent.  ([employer] Date: 06/02/14)   Baseline MET 05/31/14   Time 4   Period Weeks   Status Achieved   PT SHORT TERM GOAL #3   Title ambulates 300' with rolling walker & prostheses with supervision. ([employer] Date: 06/02/14)   Baseline partially MET 05/31/14   Time 4   Period Weeks   Status Partially Met   PT SHORT TERM GOAL #4   Title negotiate ramp, curb & stairs (2 rails) with rolling walker & prostheses with supervision ([employer] Date: 06/02/14)   Baseline partially MET 05/31/14   Time 4   Period Weeks   Status Partially Met           PT Long Term Goals - 05/10/14 1445    PT LONG TERM GOAL #1   Title Patient is independent with all aspects of prosthetic care.  ([employer] Date: 06/30/14)   Baseline 05/10/14 Patient requires cues for wear schedule & adjusting ply socks.   Time 8   Period Weeks   Status On-going   PT LONG TERM GOAL #2   Title Patient tolerates wear of bilateral prostheses >90% of awake hours with skin issues or  tenderness.   ([employer] Date: 06/30/14)   Baseline Partially MET 05/10/14 Patient's wound on left limb has healed. She reports wear >90% of out of bed hours but spends a lot of time in bed.   Time 8   Period Weeks   Status New   PT LONG TERM GOAL #3   Title patient ambulates 500' with LRAD & prostheses with  family supervision.  ([employer] Date: 06/30/14)   Baseline NOT MET 05/10/14 Patient ambulates 28' with rollator walker & prostheses with supervision /cues from PT.    Status On-going   PT LONG TERM GOAL #4   Title patient ambulates around household furniture carrrying plate /cup with LRAD & prostheses 20'  modified independent. ([employer] Date: 06/30/14)   Baseline NOT MET Patient is not safe to ambulate without RW support at this time.   Time 8   Period Weeks   Status On-going   PT LONG TERM GOAL #5   Title Patient negotiates ramp, curb & stairs with LRAD & prostheses with family supervision. ([employer] Date: 06/30/14)   Baseline 05/10/14 NOT MET Patient requires minimal assist & cues from PT with RW for ramp & curb.   Time 8   Period Weeks   Status On-going   PT LONG TERM GOAL #6   Title standing balance: without UE support maintains upright 60 seconds safely; reaches 10", to floor & in upper/lower cabinets with light UE support modified independent. ([employer] Date: 06/30/14)   Baseline NOT MET 05/10/14   Time 8   Period Weeks   Status On-going               Plan - 06/14/14 1530    Clinical Impression Statement Patient reports understanding of managing activity level. Patient improved ability to negotiate stairs.   Pt will benefit from skilled therapeutic intervention in order to improve on the following deficits Abnormal gait;Decreased activity tolerance;Decreased balance;Decreased knowledge of use of DME;Decreased mobility;Decreased strength;Other (comment)   Rehab Potential Good   PT Frequency 1x / week   PT Duration 8 weeks   PT Treatment/Interventions ADLs/Self  Care Home Management;DME Instruction;Gait training;Stair training;Functional mobility training;Therapeutic activities;Therapeutic exercise;Balance training;Neuromuscular re-education;Patient/family education;Other (comment)   PT Next Visit Plan household without device, community with rollator walker   Consulted and Agree with Plan of Care Patient;Family member/caregiver   Family Member Consulted husband & 2 sons        Problem List Patient Active Problem List   Diagnosis Date Noted  . Localization-related (focal) (partial) epilepsy and epileptic syndromes with complex partial seizures, with intractable epilepsy 10/30/2013  . Left frontal lobe mass 10/30/2013  . Status epilepticus 09/02/2013  . Acute encephalopathy 09/02/2013  . Seizure 08/29/2013  . Accelerated hypertension 08/29/2013  . Osteomyelitis 08/21/2013  . Atherosclerosis of native arteries of the extremities with ulceration(440.23) 07/14/2013  . Protein-calorie malnutrition, severe 06/20/2013  . Osteomyelitis of ankle or foot, left, acute 06/17/2013  . Charcot foot due to diabetes mellitus 05/13/2013  . PVD (peripheral vascular disease) 03/25/2012  . Atherosclerosis of native arteries of the extremities with intermittent claudication 09/16/2011  . DIABETES MELLITUS 03/14/2008  . HYPERLIPIDEMIA 03/14/2008  . ANEMIA OF CHRONIC DISEASE 03/14/2008  . ANXIETY DEPRESSION 03/14/2008  . HYPERTENSION, BENIGN 03/14/2008  . CAD, ARTERY BYPASS GRAFT 03/14/2008  . CAROTID STENOSIS 03/14/2008  . PVD 03/14/2008  . BRUIT 03/14/2008  . SEIZURES, HX OF 03/14/2008  . CAROTID ENDARTERECTOMY, RIGHT, HX OF 03/14/2008  . CHOLECYSTECTOMY, LAPAROSCOPIC, HX OF 03/14/2008    Jamey Reas PT, DPT 06/14/2014, 9:40 PM  [facility] [address] [place], [place], [address] Phone: [phone]   Fax:  [phone]

## 2014-06-21 ENCOUNTER — Ambulatory Visit: Payer: Medicare Other | Attending: Orthopedic Surgery | Admitting: Physical Therapy

## 2014-06-21 DIAGNOSIS — R269 Unspecified abnormalities of gait and mobility: Secondary | ICD-10-CM | POA: Insufficient documentation

## 2014-06-21 DIAGNOSIS — Z89512 Acquired absence of left leg below knee: Secondary | ICD-10-CM | POA: Insufficient documentation

## 2014-06-21 DIAGNOSIS — Z89511 Acquired absence of right leg below knee: Secondary | ICD-10-CM | POA: Insufficient documentation

## 2014-06-21 DIAGNOSIS — R6889 Other general symptoms and signs: Secondary | ICD-10-CM | POA: Insufficient documentation

## 2014-06-23 ENCOUNTER — Encounter: Payer: Self-pay | Admitting: Neurology

## 2014-06-23 ENCOUNTER — Ambulatory Visit (INDEPENDENT_AMBULATORY_CARE_PROVIDER_SITE_OTHER): Payer: Medicare Other | Admitting: Neurology

## 2014-06-23 VITALS — BP 116/68 | HR 78 | Resp 16 | Ht 65.0 in | Wt 192.0 lb

## 2014-06-23 DIAGNOSIS — G40209 Localization-related (focal) (partial) symptomatic epilepsy and epileptic syndromes with complex partial seizures, not intractable, without status epilepticus: Secondary | ICD-10-CM

## 2014-06-23 DIAGNOSIS — G546 Phantom limb syndrome with pain: Secondary | ICD-10-CM

## 2014-06-23 MED ORDER — LEVETIRACETAM ER 750 MG PO TB24: 1500.0000 mg | ORAL_TABLET | Freq: Every day | ORAL | Status: DC

## 2014-06-23 MED ORDER — PREGABALIN 50 MG PO CAPS: ORAL_CAPSULE | ORAL | Status: DC

## 2014-06-23 NOTE — Patient Instructions (Signed)
1. Continue Keppra ER 750mg : Take 2 tablets daily 2. Restart Lyrica 50mg  1 tablet every other day 3. Call for any problems, follow-up in 6 months  Seizure Precautions: 1. If medication has been prescribed for you to prevent seizures, take it exactly as directed.  Do not stop taking the medicine without talking to your doctor first, even if you have not had a seizure in a long time.   2. Avoid activities in which a seizure would cause danger to yourself or to others.  Don't operate dangerous machinery, swim alone, or climb in high or dangerous places, such as on ladders, roofs, or girders.  Do not drive unless your doctor says you may.  3. If you have any warning that you may have a seizure, lay down in a safe place where you can't hurt yourself.    4.  No driving for 6 months from last seizure, as per [facility].   Please refer to the following link on the [facility] website for more information: http://[email or web address].org/[email or web address]/driving/drivingu.cfm   5.  Maintain good sleep hygiene.  6.  Contact your doctor if you have any problems that may be related to the medicine you are taking.  7.  Call 911 and bring the patient back to the ED if:        A.  The seizure lasts longer than 5 minutes.       B.  The patient doesn't awaken shortly after the seizure  C.  The patient has new problems such as difficulty seeing, speaking or moving  D.  The patient was injured during the seizure  E.  The patient has a temperature over 102 F (39C)  F.  The patient vomited and now is having trouble breathing

## 2014-06-23 NOTE — Progress Notes (Signed)
NEUROLOGY FOLLOW UP OFFICE NOTE  AH TROST [id number]  HISTORY OF PRESENT ILLNESS: I had the pleasure of seeing Fion Grauman in follow-up in the neurology clinic on 06/23/2014.  The patient was last seen 3 months ago for new onset seizures in June 2015 in the setting of abnormal brain MRI. There was complete resolution of abnormal signal 2 months after. Radiology noted that findings could have been due to cerebritis or conceivably due to venous thrombosis without infarction. I wonder about PRES. She has been taking Keppra XR 1500mg  daily with no further seizures or seizure-like symptoms since June 2015. No side effects on Keppra. Her main concern on her last visit was the phantom limb pain on the left ankle. Gabapentin caused side effects. She was started on Lyrica with excellent response, however had some swelling, which she found was better taking Lyrica 50mg  every other day. She has had some weight gain. She denies any falls, no headaches, dizziness, diplopia, focal numbness/tingling/weakness.   HPI: [name] with a history of diabetes, hypertension, hyperlipidemia, peripheral vascular disease s/p right carotid endarterectomy, CAD s/p CABG, and right BKA secondary to diabetes. She was admitted for left foot osteomyelitis due to poor healing wound after a fall last winter. She underwent left transtibial amputation on 08/22/13. She was discharged home on 06/11, then started having altered mental status on 06/15. Her son reports she had hit her head in the left frontal region when getting into their car, then started acting drowsy and out of it the next day. That evening, they were getting ready to eat when her right arm flexed and clenched into her chest followed by head turn to the left,stiffening and shaking for 1 minute as she fell to her left side. She was confused for 10 minutes, back to baseline within 30 minutes. She was brought to [facility] ER where BP was noted to be 205/87.  She was given IV labetalol with slow improvement in BP. Blood sugar noted to be 303. Head CT showed low attenuation in the left corona radiata and centrum semiovale. She had an MRI brain initially without contrast which showed increased T2 and FLAIR signal in the left hemisphere from the anterior frontal cortex, supplemental motor area, extending down to the posterior limb of the internal capsule. She became agitated and needed sedation for MRI brain with contrast, however again became restless with motion degradation, no abnormal enhancement seen. Left ICA flow void was noted to be absent, similar since 2009. It was felt that symptoms were not consistent with left MCA ischemia and concern was for viral CNS infection and received 4 days of empiric IV Acyclovir. She underwent a lumbar puncture which showed WBC 1, RBC 17, glucose 82, protein 29, VDRL, CMV, HSV, arbovirus, gram stain and culture negative. No malignant cells on cytology. She was started on Keppra with no further seizures, discharged home on 06/18. She was apparently intermittently confused at home, woke up better then next day, then had another seizure that morning. Her son today reports she had a generalized convulsion with head deviation to left for 5 minutes. On arrival to the ER, per records, she was poorly responsive with right gaze deviation, nystagmoid eye movements. BP 197/80 She was given IV Ativan, according to her son she remained in this state for an hour, then was unresponsive for 36 hours after. Prolonged EEG showed diffuse slowing, no electrographic seizures or epileptiform discharges. Repeat MRI brain with and without contrast was  again degraded by motion. Similar abnormal signal in the left frontal and parietal lobes again noted, predominantly in the centrum semiovale extending into the subcortical white matter. There is an area of questionable thickening in the left frontal cortex noted to be unchanged, no effacement of sulci in this  area, no restricted diffusion, no abnormal enhancement although study is suboptimal. Chronically occluded left ICA again noted. She was discharged home on Keppra XR 1500mg /day. Repeat MRI 2 months later showed complete resolution of abnormal signal in the left frontal and parietal regions. There was mild chronic small vessel disease bilaterally. Per report, findings could have been due to cerebritis or conceivably due to venous thrombosis without infarction.    Of note, in 2009, she had a nocturnal convulsion and was admitted to [facility]. Records unavailable for review, according to her son she was not started on seizure medication due to first seizure. She had an MRI brain at that time which I personally reviewed, it was noted to be limited by motion and patient refusing to complete study, however on FLAIR sequences, white matter changes in the left hemisphere are not present.   Epilepsy Risk Factors: Left frontal white matter lesion that has resolved. Otherwise she had a normal birth and early development. There is no history of febrile convulsions, significant traumatic brain injury, neurosurgical procedures, or family history of seizures.   PAST MEDICAL HISTORY: Past Medical History  Diagnosis Date  . Hypertension   . Neuropathy   . Peripheral vascular disease   . Coronary artery disease   . Anxiety   . Depression   . Anemia   . Type II diabetes mellitus   . High cholesterol   . History of blood transfusion     "one time; related to diabetes I guess"   . Arthritis     "hands"  . GERD (gastroesophageal reflux disease)     takes Zantac  . Complication of anesthesia     pt felt like she had a hard time waking up after surgery in Feb. 2015.  Marland Kitchen CHF (congestive heart failure)   . Seizures     MEDICATIONS: Current Outpatient Prescriptions on File Prior to Visit  Medication Sig Dispense Refill  . amitriptyline (ELAVIL) 25 MG tablet Take 25 mg by mouth at bedtime as needed for sleep.     Marland Kitchen  aspirin 81 MG tablet Take 81 mg by mouth daily.      Marland Kitchen atorvastatin (LIPITOR) 80 MG tablet Take 80 mg by mouth daily.      . clopidogrel (PLAVIX) 75 MG tablet Take 75 mg by mouth daily.     . insulin NPH Human (HUMULIN N,NOVOLIN N) 100 UNIT/ML injection Inject 25-35 Units into the skin 2 (two) times daily before a meal. 35 units in the morning and 25 units in the evening    . metFORMIN (GLUCOPHAGE) 500 MG tablet Take 1 tablet (500 mg total) by mouth 4 (four) times daily. (Patient taking differently: 500 mg. Takes 1 tablet every other day)    . metoprolol tartrate (LOPRESSOR) 50 MG tablet Take 0.5 tablets (25 mg total) by mouth 2 (two) times daily. 60 tablet 0  . Multiple Vitamins-Minerals (MULTIVITAMINS THER. W/MINERALS) TABS Take 1 tablet by mouth daily.      . quinapril (ACCUPRIL) 40 MG tablet Take 40 mg by mouth daily.     . ranitidine (ZANTAC) 150 MG tablet Take 150 mg by mouth 2 (two) times daily.     . sertraline (ZOLOFT) 100  MG tablet Take 100 mg by mouth 2 (two) times daily.      No current facility-administered medications on file prior to visit.    ALLERGIES: Allergies  Allergen Reactions  . Tape Rash    Redness, rash, itchiness    FAMILY HISTORY: Family History  Problem Relation Age of Onset  . Cancer Mother   . Heart disease Father   . Diabetes Father   . Hyperlipidemia Father   . Hypertension Father     SOCIAL HISTORY: History   Social History  . Marital Status: Married    Spouse Name: N/A  . Number of Children: N/A  . Years of Education: N/A   Occupational History  . Not on file.   Social History Main Topics  . Smoking status: Never Smoker   . Smokeless tobacco: Never Used  . Alcohol Use: No  . Drug Use: No  . Sexual Activity: Not Currently   Other Topics Concern  . Not on file   Social History Narrative    REVIEW OF SYSTEMS: Constitutional: No fevers, chills, or sweats, no generalized fatigue, change in appetite Eyes: No visual changes, double  vision, eye pain Ear, nose and throat: No hearing loss, ear pain, nasal congestion, sore throat Cardiovascular: No chest pain, palpitations Respiratory:  No shortness of breath at rest or with exertion, wheezes GastrointestinaI: No nausea, vomiting, diarrhea, abdominal pain, fecal incontinence Genitourinary:  No dysuria, urinary retention or frequency Musculoskeletal:  No neck pain, back pain Integumentary: No rash, pruritus, skin lesions Neurological: as above Psychiatric: No depression, insomnia, anxiety Endocrine: No palpitations, fatigue, diaphoresis, mood swings, change in appetite,+change in weight, no increased thirst Hematologic/Lymphatic:  No anemia, purpura, petechiae. Allergic/Immunologic: no itchy/runny eyes, nasal congestion, recent allergic reactions, rashes  PHYSICAL EXAM: Filed Vitals:   06/23/14 1528  BP: 116/68  Pulse: 78  Resp: 16   General: No acute distress Head:  Normocephalic/atraumatic Neck: supple, no paraspinal tenderness, full range of motion Heart:  Regular rate and rhythm Lungs:  Clear to auscultation bilaterally Back: No paraspinal tenderness Skin/Extremities: No rash, no edema, s/p BKA bilaterally Neurological Exam:alert and oriented to person, place, time. No dysarthria or aphasia, Fund of knowledge is appropriate. 3/3 delayed recall. Remote memory are intact. Attention and concentration are normal. Able to name objects and repeat phrases.  Cranial nerves:  CN I: not tested  CN II: pupils equal, round and reactive to light, visual fields intact, fundi unremarkable.  CN III, IV, VI: full range of motion, no nystagmus, no ptosis  CN V: facial sensation intact  CN VII: no facial asymmetry CN VIII: hearing intact to finger rub  CN IX, X: gag intact, uvula midline  CN XI: sternocleidomastoid and trapezius muscles intact  CN XII: tongue midline  Bulk & Tone: normal, no fasciculations.  Motor: 5/5 on both UE and both proximal LE, she is s/p  amputation below knees bilaterally Sensation: intact to light touch. No extinction to double simultaneous stimulation.  Deep Tendon Reflexes: +2 on both UE, unable to elicit in both knees  Plantar responses: not tested  Cerebellar: no incoordination on finger to nose testing  Gait: able to ambulate with prosthetic legs without walker, although unsteady  Tremor: none   IMPRESSION:  [name] with a history of diabetes, hypertension, hyperlipidemia, peripheral vascular disease s/p right carotid endarterectomy, CAD s/p CABG, right BKA, nocturnal convulsion in 2009. She underwent left transtibial amputation on 08/22/13 then had a seizure on 08/25/13. MRI brain  showed a lesion in the left frontal and parietal white matter, concerning for vasogenic edema. LP unremarkable, including cytology. Repeat MRI brain 2 months later showed complete resolution of abnormal signal. Considerations include cerebritis, venous thrombosis without infarction. She was noted to have hypertensive emergency with the first seizure, although asymmetric and more frontal, atypical PRES is another consideration, particularly with complete resolution on MRI. No further seizures since end of June 2015. She will continue Keppra XR 1500mg  qhs for now. She had an excellent response to Lyrica for phantom limb pain and will restart 50mg  every other day (side effects on daily dosing). She will follow-up in 6 months and knows to call our office for any problems in the interim.   Thank you for allowing me to participate in her care.  Please do not hesitate to call for any questions or concerns.  The duration of this appointment visit was 15 minutes of face-to-face time with the patient.  Greater than 50% of this time was spent in counseling, explanation of diagnosis, planning of further management, and coordination of care.   Ellouise Newer, M.D.   CC: Dr. Amanda Cockayne

## 2014-06-27 DIAGNOSIS — G546 Phantom limb syndrome with pain: Secondary | ICD-10-CM | POA: Insufficient documentation

## 2014-06-27 DIAGNOSIS — G40209 Localization-related (focal) (partial) symptomatic epilepsy and epileptic syndromes with complex partial seizures, not intractable, without status epilepticus: Secondary | ICD-10-CM

## 2014-06-27 HISTORY — DX: Localization-related (focal) (partial) symptomatic epilepsy and epileptic syndromes with complex partial seizures, not intractable, without status epilepticus: G40.209

## 2014-06-27 HISTORY — DX: Phantom limb syndrome with pain: G54.6

## 2014-06-28 ENCOUNTER — Ambulatory Visit: Payer: Medicare Other | Admitting: Physical Therapy

## 2014-07-05 ENCOUNTER — Encounter: Payer: Medicare Other | Admitting: Physical Therapy

## 2014-07-12 ENCOUNTER — Encounter: Payer: Medicare Other | Admitting: Physical Therapy

## 2014-07-19 ENCOUNTER — Encounter: Payer: Medicare Other | Admitting: Physical Therapy

## 2014-08-22 ENCOUNTER — Encounter: Payer: Self-pay | Admitting: Physical Therapy

## 2014-08-22 NOTE — Therapy (Signed)
[facility] [address] [place], [place], [address] Phone: [phone]   Fax:  [phone]  Patient Details  Name: [name] MRN: [id number] Date of Birth: [birth year] Referring Provider:  No ref. provider found  Encounter Date: 08/22/2014 PHYSICAL THERAPY DISCHARGE SUMMARY  Visits from Start of Care: 10  Current functional level related to goals / functional outcomes:     PT Long Term Goals - 05/10/14 1445    PT LONG TERM GOAL #1   Title Patient is independent with all aspects of prosthetic care.  ([employer] Date: 06/30/14)   Baseline MET   Time 8   Period Weeks   Status Achieved   PT LONG TERM GOAL #2   Title Patient tolerates wear of bilateral prostheses >90% of awake hours with skin issues or tenderness.   ([employer] Date: 06/30/14)   Baseline MET   Time 8   Period Weeks   Status Achieved   PT LONG TERM GOAL #3   Title patient ambulates 500' with LRAD & prostheses with family supervision.  ([employer] Date: 06/30/14)   Baseline MET with rollator   Status MET   PT LONG TERM GOAL #4   Title patient ambulates around household furniture carrrying plate /cup with LRAD & prostheses 20'  modified independent. ([employer] Date: 06/30/14)   Baseline Patient reports MET but PT feels needs supervision for safety   Time 8   Period Weeks   Status    PT LONG TERM GOAL #5   Title Patient negotiates ramp, curb & stairs with LRAD & prostheses with family supervision. ([employer] Date: 06/30/14)   Baseline Pt requires minA with rollator   Time 8   Period Weeks   Status    PT LONG TERM GOAL #6   Title standing balance: without UE support maintains upright 60 seconds safely; reaches 10", to floor & in upper/lower cabinets with light UE support modified independent. ([employer] Date: 06/30/14)   Baseline NOT RETESTED at patient did not return for dc   Time 8   Period Weeks   Status        Remaining deficits: Patient  called to request discharge as satisfied with current level of progress.   Education / Equipment: Prosthetic care & use  Plan: Patient agrees to discharge.  Patient goals were partially met. Patient is being discharged due to being pleased with the current functional level.  ?????       Crissy Mccreadie PT, DPT 08/22/2014, 8:05 AM  [facility] [address] [place], [place], [address] Phone: [phone]   Fax:  [phone]

## 2014-12-25 ENCOUNTER — Encounter: Payer: Self-pay | Admitting: Neurology

## 2014-12-25 ENCOUNTER — Ambulatory Visit (INDEPENDENT_AMBULATORY_CARE_PROVIDER_SITE_OTHER): Payer: Medicare Other | Admitting: Neurology

## 2014-12-25 VITALS — BP 124/74 | HR 75 | Resp 16 | Ht 65.0 in | Wt 196.0 lb

## 2014-12-25 DIAGNOSIS — G40209 Localization-related (focal) (partial) symptomatic epilepsy and epileptic syndromes with complex partial seizures, not intractable, without status epilepticus: Secondary | ICD-10-CM | POA: Diagnosis not present

## 2014-12-25 DIAGNOSIS — G546 Phantom limb syndrome with pain: Secondary | ICD-10-CM

## 2014-12-25 HISTORY — PX: CATARACT EXTRACTION: SUR2

## 2014-12-25 MED ORDER — PREGABALIN 50 MG PO CAPS: ORAL_CAPSULE | ORAL | Status: DC

## 2014-12-25 MED ORDER — LEVETIRACETAM ER 750 MG PO TB24: 1500.0000 mg | ORAL_TABLET | Freq: Every day | ORAL | Status: DC

## 2014-12-25 NOTE — Patient Instructions (Signed)
1. Continue Keppra XR 1500mg  daily 2. Continue taking Lyrica as needed for phantom limb pain 3. Physical exercise and brain stimulation exercises (crossword puzzles, word search) are important for brain health 4. Follow-up in 6 months  Seizure Precautions: 1. If medication has been prescribed for you to prevent seizures, take it exactly as directed.  Do not stop taking the medicine without talking to your doctor first, even if you have not had a seizure in a long time.   2. Avoid activities in which a seizure would cause danger to yourself or to others.  Don't operate dangerous machinery, swim alone, or climb in high or dangerous places, such as on ladders, roofs, or girders.  Do not drive unless your doctor says you may.  3. If you have any warning that you may have a seizure, lay down in a safe place where you can't hurt yourself.    4.  No driving for 6 months from last seizure, as per [facility].   Please refer to the following link on the [facility] website for more information: http://[email or web address].org/[email or web address]/driving/drivingu.cfm   5.  Maintain good sleep hygiene.  6.  Contact your doctor if you have any problems that may be related to the medicine you are taking.  7.  Call 911 and bring the patient back to the ED if:        A.  The seizure lasts longer than 5 minutes.       B.  The patient doesn't awaken shortly after the seizure  C.  The patient has new problems such as difficulty seeing, speaking or moving  D.  The patient was injured during the seizure  E.  The patient has a temperature over 102 F (39C)  F.  The patient vomited and now is having trouble breathing

## 2014-12-25 NOTE — Progress Notes (Signed)
NEUROLOGY FOLLOW UP OFFICE NOTE  [name] [id number]  HISTORY OF PRESENT ILLNESS: I had the pleasure of seeing [name] in follow-up in the neurology clinic on 12/25/2014.  The patient was last seen 6 months ago for new onset seizures in June 2015 in the setting of abnormal brain MRI. There was complete resolution of abnormal signal 2 months after. Radiology noted that findings could have been due to cerebritis or conceivably due to venous thrombosis without infarction. I wonder about PRES. She has been taking Keppra XR 1500mg  daily with no further seizures or seizure-like symptoms since June 2015. No side effects on Keppra except for mild memory changes where she would forget where she was in a conversation if she gets distracted. Her son feels her memory is better than his. She does not drive. She denies any missed bill payments or missed medications. She has always misplaced things, which is unchanged. She had also been having phantom limb pain on the left ankle, with good response to Lyrica. Gabapentin caused side effects. She only takes Lyrica 50mg  as needed when pain is more intense, usually three times a week. She denies any falls, no headaches, dizziness, diplopia, focal numbness/tingling/weakness. She had left cataract surgery this morning.  HPI: [name] with a history of diabetes, hypertension, hyperlipidemia, peripheral vascular disease s/p right carotid endarterectomy, CAD s/p CABG, and right BKA secondary to diabetes. She was admitted for left foot osteomyelitis due to poor healing wound after a fall last winter. She underwent left transtibial amputation on 08/22/13. She was discharged home on 06/11, then started having altered mental status on 06/15. Her son reports she had hit her head in the left frontal region when getting into their car, then started acting drowsy and out of it the next day. That evening, they were getting ready to eat when her right arm  flexed and clenched into her chest followed by head turn to the left,stiffening and shaking for 1 minute as she fell to her left side. She was confused for 10 minutes, back to baseline within 30 minutes. She was brought to [facility] ER where BP was noted to be 205/87. She was given IV labetalol with slow improvement in BP. Blood sugar noted to be 303. Head CT showed low attenuation in the left corona radiata and centrum semiovale. She had an MRI brain initially without contrast which showed increased T2 and FLAIR signal in the left hemisphere from the anterior frontal cortex, supplemental motor area, extending down to the posterior limb of the internal capsule. She became agitated and needed sedation for MRI brain with contrast, however again became restless with motion degradation, no abnormal enhancement seen. Left ICA flow void was noted to be absent, similar since 2009. It was felt that symptoms were not consistent with left MCA ischemia and concern was for viral CNS infection and received 4 days of empiric IV Acyclovir. She underwent a lumbar puncture which showed WBC 1, RBC 17, glucose 82, protein 29, VDRL, CMV, HSV, arbovirus, gram stain and culture negative. No malignant cells on cytology. She was started on Keppra with no further seizures, discharged home on 06/18. She was apparently intermittently confused at home, woke up better then next day, then had another seizure that morning. Her son today reports she had a generalized convulsion with head deviation to left for 5 minutes. On arrival to the ER, per records, she was poorly responsive with right gaze deviation, nystagmoid eye movements. BP  197/80 She was given IV Ativan, according to her son she remained in this state for an hour, then was unresponsive for 36 hours after. Prolonged EEG showed diffuse slowing, no electrographic seizures or epileptiform discharges. Repeat MRI brain with and without contrast was again degraded by motion. Similar abnormal signal  in the left frontal and parietal lobes again noted, predominantly in the centrum semiovale extending into the subcortical white matter. There is an area of questionable thickening in the left frontal cortex noted to be unchanged, no effacement of sulci in this area, no restricted diffusion, no abnormal enhancement although study is suboptimal. Chronically occluded left ICA again noted. She was discharged home on Keppra XR 1500mg /day. Repeat MRI 2 months later showed complete resolution of abnormal signal in the left frontal and parietal regions. There was mild chronic small vessel disease bilaterally. Per report, findings could have been due to cerebritis or conceivably due to venous thrombosis without infarction.   Of note, in 2009, she had a nocturnal convulsion and was admitted to [facility]. Records unavailable for review, according to her son she was not started on seizure medication due to first seizure. She had an MRI brain at that time which I personally reviewed, it was noted to be limited by motion and patient refusing to complete study, however on FLAIR sequences, white matter changes in the left hemisphere are not present.   Epilepsy Risk Factors: Left frontal white matter lesion that has resolved. Otherwise she had a normal birth and early development. There is no history of febrile convulsions, significant traumatic brain injury, neurosurgical procedures, or family history of seizures.   PAST MEDICAL HISTORY: Past Medical History  Diagnosis Date  . Hypertension   . Neuropathy ([place])   . Peripheral vascular disease ([place])   . Coronary artery disease   . Anxiety   . Depression   . Anemia   . Type II diabetes mellitus ([place])   . High cholesterol   . History of blood transfusion     "one time; related to diabetes I guess"   . Arthritis     "hands"  . GERD (gastroesophageal reflux disease)     takes Zantac  . Complication of anesthesia     pt felt like she had a hard time waking up after  surgery in Feb. 2015.  Marland Kitchen CHF (congestive heart failure) ([place])   . Seizures ([place])     MEDICATIONS: Current Outpatient Prescriptions on File Prior to Visit  Medication Sig Dispense Refill  . amitriptyline (ELAVIL) 25 MG tablet Take 25 mg by mouth at bedtime as needed for sleep.     Marland Kitchen aspirin 81 MG tablet Take 81 mg by mouth daily.      Marland Kitchen atorvastatin (LIPITOR) 80 MG tablet Take 80 mg by mouth daily.      . clopidogrel (PLAVIX) 75 MG tablet Take 75 mg by mouth daily.     . insulin NPH Human (HUMULIN N,NOVOLIN N) 100 UNIT/ML injection Inject 25-35 Units into the skin 2 (two) times daily before a meal. 35 units in the morning and 25 units in the evening    . Levetiracetam 750 MG TB24 Take 2 tablets (1,500 mg total) by mouth daily. 180 tablet 3  . LORazepam (ATIVAN) 1 MG tablet Take 1 mg by mouth at bedtime as needed.  5  . metoprolol tartrate (LOPRESSOR) 50 MG tablet Take 0.5 tablets (25 mg total) by mouth 2 (two) times daily. 60 tablet 0  . Multiple Vitamins-Minerals (MULTIVITAMINS THER.  W/MINERALS) TABS Take 1 tablet by mouth daily.      . pregabalin (LYRICA) 50 MG capsule Take 1 capsule every other day. (Patient taking differently: Takes 2-3 times a week) 45 capsule 3  . quinapril (ACCUPRIL) 40 MG tablet Take 40 mg by mouth daily.     . ranitidine (ZANTAC) 150 MG tablet Take 150 mg by mouth 2 (two) times daily.     . sertraline (ZOLOFT) 100 MG tablet Take 100 mg by mouth 2 (two) times daily.      No current facility-administered medications on file prior to visit.    ALLERGIES: Allergies  Allergen Reactions  . Tape Rash    Redness, rash, itchiness    FAMILY HISTORY: Family History  Problem Relation Age of Onset  . Cancer Mother   . Heart disease Father   . Diabetes Father   . Hyperlipidemia Father   . Hypertension Father     SOCIAL HISTORY: Social History   Social History  . Marital Status: Married    Spouse Name: N/A  . Number of Children: N/A  . Years of Education:  N/A   Occupational History  . Not on file.   Social History Main Topics  . Smoking status: Never Smoker   . Smokeless tobacco: Never Used  . Alcohol Use: No  . Drug Use: No  . Sexual Activity: Not Currently   Other Topics Concern  . Not on file   Social History Narrative    REVIEW OF SYSTEMS: Constitutional: No fevers, chills, or sweats, no generalized fatigue, change in appetite Eyes: s/p left cataract surgery this morning Ear, nose and throat: No hearing loss, ear pain, nasal congestion, sore throat Cardiovascular: No chest pain, palpitations Respiratory:  No shortness of breath at rest or with exertion, wheezes GastrointestinaI: No nausea, vomiting, diarrhea, abdominal pain, fecal incontinence Genitourinary:  No dysuria, urinary retention or frequency Musculoskeletal:  No neck pain, back pain Integumentary: No rash, pruritus, skin lesions Neurological: as above Psychiatric: No depression, insomnia, anxiety Endocrine: No palpitations, fatigue, diaphoresis, mood swings, change in appetite, change in weight, increased thirst Hematologic/Lymphatic:  No anemia, purpura, petechiae. Allergic/Immunologic: no itchy/runny eyes, nasal congestion, recent allergic reactions, rashes  PHYSICAL EXAM: Filed Vitals:   12/25/14 1550  BP: 124/74  Pulse: 75  Resp: 16   General: No acute distress Head: Normocephalic/atraumatic Neck: supple, no paraspinal tenderness, full range of motion Heart: Regular rate and rhythm Lungs: Clear to auscultation bilaterally Back: No paraspinal tenderness Skin/Extremities: No rash, no edema, s/p BKA bilaterally Neurological Exam:alert and oriented to person, place, time. No dysarthria or aphasia, Fund of knowledge is appropriate. Remote and recent recall intact. Remote memory are intact. Attention and concentration are normal. Able to name objects and repeat phrases. Clock drawing test 5/5 MMSE - Mini Mental State Exam 12/25/2014  Orientation to time  5  Orientation to Place 5  Registration 3  Attention/ Calculation 5  Recall 3  Language- name 2 objects 2  Language- repeat 1  Language- follow 3 step command 3  Language- read & follow direction 1  Write a sentence 1  Copy design 1  Total score 30   Cranial nerves:  CN I: not tested  CN II: right pupil 9mm reactive, left pupil dilated post cataract surgery, visual fields intact, fundi unremarkable.  CN III, IV, VI: full range of motion, no nystagmus, no ptosis  CN V: facial sensation intact  CN VII: no facial asymmetry CN VIII: hearing intact to finger rub  CN IX, X: gag intact, uvula midline  CN XI: sternocleidomastoid and trapezius muscles intact  CN XII: tongue midline  Bulk & Tone: normal, no fasciculations.  Motor: 5/5 on both UE and both proximal LE, she is s/p amputation below knees bilaterally Sensation: intact to light touch. No extinction to double simultaneous stimulation.  Deep Tendon Reflexes: +2 on both UE, unable to elicit in both knees  Plantar responses: not tested  Cerebellar: no incoordination on finger to nose testing  Gait: able to ambulate with prosthetic legs without walker, although unsteady  Tremor: none   IMPRESSION:  [name] with a history of diabetes, hypertension, hyperlipidemia, peripheral vascular disease s/p right carotid endarterectomy, CAD s/p CABG, right BKA, nocturnal convulsion in 2009. She underwent left transtibial amputation on 08/22/13 then had a seizure on 08/25/13. MRI brain showed a lesion in the left frontal and parietal white matter, concerning for vasogenic edema. LP unremarkable, including cytology. Repeat MRI brain 2 months later showed complete resolution of abnormal signal. Considerations include cerebritis, venous thrombosis without infarction. She was noted to have hypertensive emergency with the first seizure, although asymmetric and more frontal, atypical PRES is another consideration,  particularly with complete resolution on MRI. No further seizures since end of June 2015. She will continue Keppra XR 1500mg  qhs. She reports some cognitive changes, MMSE today 30/30, continue to monitor for now. She had an excellent response to Lyrica for phantom limb pain and only takes it three times a week now. She does not drive and is aware of [place] driving laws to stop driving after a seizure until 6 months seizure-free. She will follow-up in 6 months and knows to call our office for any problems in the interim.   Thank you for allowing me to participate in her care.  Please do not hesitate to call for any questions or concerns.  The duration of this appointment visit was 25 minutes of face-to-face time with the patient.  Greater than 50% of this time was spent in counseling, explanation of diagnosis, planning of further management, and coordination of care.   Ellouise Newer, M.D.   CC: Dr. Amanda Cockayne

## 2015-05-11 DIAGNOSIS — E669 Obesity, unspecified: Secondary | ICD-10-CM | POA: Diagnosis not present

## 2015-05-11 DIAGNOSIS — R700 Elevated erythrocyte sedimentation rate: Secondary | ICD-10-CM | POA: Diagnosis not present

## 2015-05-11 DIAGNOSIS — E1140 Type 2 diabetes mellitus with diabetic neuropathy, unspecified: Secondary | ICD-10-CM | POA: Diagnosis not present

## 2015-05-11 DIAGNOSIS — E1129 Type 2 diabetes mellitus with other diabetic kidney complication: Secondary | ICD-10-CM | POA: Diagnosis not present

## 2015-05-11 DIAGNOSIS — M79643 Pain in unspecified hand: Secondary | ICD-10-CM | POA: Diagnosis not present

## 2015-05-11 DIAGNOSIS — E559 Vitamin D deficiency, unspecified: Secondary | ICD-10-CM | POA: Diagnosis not present

## 2015-05-11 DIAGNOSIS — G546 Phantom limb syndrome with pain: Secondary | ICD-10-CM | POA: Diagnosis not present

## 2015-05-11 DIAGNOSIS — Z89512 Acquired absence of left leg below knee: Secondary | ICD-10-CM | POA: Diagnosis not present

## 2015-06-11 DIAGNOSIS — I2510 Atherosclerotic heart disease of native coronary artery without angina pectoris: Secondary | ICD-10-CM | POA: Diagnosis not present

## 2015-06-11 DIAGNOSIS — G546 Phantom limb syndrome with pain: Secondary | ICD-10-CM | POA: Diagnosis not present

## 2015-06-11 DIAGNOSIS — E1140 Type 2 diabetes mellitus with diabetic neuropathy, unspecified: Secondary | ICD-10-CM | POA: Diagnosis not present

## 2015-06-11 DIAGNOSIS — Z1389 Encounter for screening for other disorder: Secondary | ICD-10-CM | POA: Diagnosis not present

## 2015-06-26 ENCOUNTER — Ambulatory Visit: Payer: Medicare Other | Admitting: Neurology

## 2015-07-04 ENCOUNTER — Ambulatory Visit (INDEPENDENT_AMBULATORY_CARE_PROVIDER_SITE_OTHER): Payer: Medicare Other | Admitting: Neurology

## 2015-07-04 ENCOUNTER — Ambulatory Visit: Payer: Medicare Other | Admitting: Neurology

## 2015-07-04 ENCOUNTER — Encounter: Payer: Self-pay | Admitting: Neurology

## 2015-07-04 VITALS — BP 112/60 | HR 71 | Ht 61.0 in | Wt 194.0 lb

## 2015-07-04 DIAGNOSIS — G546 Phantom limb syndrome with pain: Secondary | ICD-10-CM

## 2015-07-04 DIAGNOSIS — G40209 Localization-related (focal) (partial) symptomatic epilepsy and epileptic syndromes with complex partial seizures, not intractable, without status epilepticus: Secondary | ICD-10-CM | POA: Diagnosis not present

## 2015-07-04 MED ORDER — PREGABALIN 50 MG PO CAPS: ORAL_CAPSULE | ORAL | Status: DC

## 2015-07-04 MED ORDER — LEVETIRACETAM ER 750 MG PO TB24: 1500.0000 mg | ORAL_TABLET | Freq: Every day | ORAL | Status: DC

## 2015-07-04 NOTE — Progress Notes (Signed)
NEUROLOGY FOLLOW UP OFFICE NOTE  CONNEE RUBACH JT:[id number]  HISTORY OF PRESENT ILLNESS: I had the pleasure of seeing [name] in follow-up in the neurology clinic on 07/04/2015.  The patient was last seen 6 months ago for new onset seizures in June 2015 in the setting of abnormal brain MRI. There was complete resolution of abnormal signal 2 months after. Radiology noted that findings could have been due to cerebritis or conceivably due to venous thrombosis without infarction. I wonder about PRES. She has been taking Keppra XR 1500mg  daily with no further seizures or seizure-like symptoms since June 2015. She takes Lyrica 50mg  three times a week for left ankle phantom limb pain, alternating this with Vicodin. Gabapentin caused side effects.  She reports that her thoughts are more clear. She comes in with a walker today, and is happy to report she is walking more and uses the walker more for support. She denies any seizures. She reports that she may have had a "nervous breakdown," the least of little things tick her off, she would get so uptight that her sons did not want to come home. She feels mood is getting better. She also reports occasional right parietal sharp headaches lasting less than an hour. She does not take any medication for the pain. She notices the pain mostly in the evening, usually when her other son who stresses her out is home. She reports feeling anxious and "thinks of a lot of stuff," she has not taken prn Ativan for a few months. She denies any falls, no headaches, dizziness, diplopia, focal numbness/tingling/weakness.  HPI: This is a 60 yo RH woman with a history of diabetes, hypertension, hyperlipidemia, peripheral vascular disease s/p right carotid endarterectomy, CAD s/p CABG, and right BKA secondary to diabetes. She was admitted for left foot osteomyelitis due to poor healing wound after a fall last winter. She underwent left transtibial amputation on 08/22/13. She  was discharged home on 06/11, then started having altered mental status on 06/15. Her son reports she had hit her head in the left frontal region when getting into their car, then started acting drowsy and out of it the next day. That evening, they were getting ready to eat when her right arm flexed and clenched into her chest followed by head turn to the left,stiffening and shaking for 1 minute as she fell to her left side. She was confused for 10 minutes, back to baseline within 30 minutes. She was brought to [facility] ER where BP was noted to be 205/87. She was given IV labetalol with slow improvement in BP. Blood sugar noted to be 303. Head CT showed low attenuation in the left corona radiata and centrum semiovale. She had an MRI brain initially without contrast which showed increased T2 and FLAIR signal in the left hemisphere from the anterior frontal cortex, supplemental motor area, extending down to the posterior limb of the internal capsule. She became agitated and needed sedation for MRI brain with contrast, however again became restless with motion degradation, no abnormal enhancement seen. Left ICA flow void was noted to be absent, similar since 2009. It was felt that symptoms were not consistent with left MCA ischemia and concern was for viral CNS infection and received 4 days of empiric IV Acyclovir. She underwent a lumbar puncture which showed WBC 1, RBC 17, glucose 82, protein 29, VDRL, CMV, HSV, arbovirus, gram stain and culture negative. No malignant cells on cytology. She was started on Keppra with no further seizures,  discharged home on 06/18. She was apparently intermittently confused at home, woke up better then next day, then had another seizure that morning. Her son today reports she had a generalized convulsion with head deviation to left for 5 minutes. On arrival to the ER, per records, she was poorly responsive with right gaze deviation, nystagmoid eye movements. BP 197/80 She was given IV  Ativan, according to her son she remained in this state for an hour, then was unresponsive for 36 hours after. Prolonged EEG showed diffuse slowing, no electrographic seizures or epileptiform discharges. Repeat MRI brain with and without contrast was again degraded by motion. Similar abnormal signal in the left frontal and parietal lobes again noted, predominantly in the centrum semiovale extending into the subcortical white matter. There is an area of questionable thickening in the left frontal cortex noted to be unchanged, no effacement of sulci in this area, no restricted diffusion, no abnormal enhancement although study is suboptimal. Chronically occluded left ICA again noted. She was discharged home on Keppra XR 1500mg /day. Repeat MRI 2 months later showed complete resolution of abnormal signal in the left frontal and parietal regions. There was mild chronic small vessel disease bilaterally. Per report, findings could have been due to cerebritis or conceivably due to venous thrombosis without infarction.   Of note, in 2009, she had a nocturnal convulsion and was admitted to [facility]. Records unavailable for review, according to her son she was not started on seizure medication due to first seizure. She had an MRI brain at that time which I personally reviewed, it was noted to be limited by motion and patient refusing to complete study, however on FLAIR sequences, white matter changes in the left hemisphere are not present.   Epilepsy Risk Factors: Left frontal white matter lesion that has resolved. Otherwise she had a normal birth and early development. There is no history of febrile convulsions, significant traumatic brain injury, neurosurgical procedures, or family history of seizures.   PAST MEDICAL HISTORY: Past Medical History  Diagnosis Date  . Hypertension   . Neuropathy ([place])   . Peripheral vascular disease ([place])   . Coronary artery disease   . Anxiety   . Depression   . Anemia   . Type II  diabetes mellitus ([place])   . High cholesterol   . History of blood transfusion     "one time; related to diabetes I guess"   . Arthritis     "hands"  . GERD (gastroesophageal reflux disease)     takes Zantac  . Complication of anesthesia     pt felt like she had a hard time waking up after surgery in Feb. 2015.  Marland Kitchen CHF (congestive heart failure) ([place])   . Seizures ([place])     MEDICATIONS: Current Outpatient Prescriptions on File Prior to Visit  Medication Sig Dispense Refill  . amitriptyline (ELAVIL) 25 MG tablet Take 25 mg by mouth at bedtime as needed for sleep.     Marland Kitchen aspirin 81 MG tablet Take 81 mg by mouth daily.      Marland Kitchen atorvastatin (LIPITOR) 80 MG tablet Take 80 mg by mouth daily.      . clopidogrel (PLAVIX) 75 MG tablet Take 75 mg by mouth daily.     . insulin NPH Human (HUMULIN N,NOVOLIN N) 100 UNIT/ML injection Inject 25-35 Units into the skin 2 (two) times daily before a meal. 35 units in the morning and 25 units in the evening    . Levetiracetam 750 MG TB24  Take 2 tablets (1,500 mg total) by mouth daily. 180 tablet 3  . LORazepam (ATIVAN) 1 MG tablet Take 1 mg by mouth at bedtime as needed.  5  . metoprolol tartrate (LOPRESSOR) 50 MG tablet Take 0.5 tablets (25 mg total) by mouth 2 (two) times daily. 60 tablet 0  . Multiple Vitamins-Minerals (MULTIVITAMINS THER. W/MINERALS) TABS Take 1 tablet by mouth daily.      . pregabalin (LYRICA) 50 MG capsule Take 1 capsule three times a week for phantom limb pain 45 capsule 3  . quinapril (ACCUPRIL) 40 MG tablet Take 40 mg by mouth daily.     . ranitidine (ZANTAC) 150 MG tablet Take 150 mg by mouth 2 (two) times daily.     . sertraline (ZOLOFT) 100 MG tablet Take 100 mg by mouth 2 (two) times daily.      No current facility-administered medications on file prior to visit.    ALLERGIES: Allergies  Allergen Reactions  . Tape Rash    Redness, rash, itchiness    FAMILY HISTORY: Family History  Problem Relation Age of Onset  .  Cancer Mother   . Heart disease Father   . Diabetes Father   . Hyperlipidemia Father   . Hypertension Father     SOCIAL HISTORY: Social History   Social History  . Marital Status: Married    Spouse Name: N/A  . Number of Children: N/A  . Years of Education: N/A   Occupational History  . Not on file.   Social History Main Topics  . Smoking status: Never Smoker   . Smokeless tobacco: Never Used  . Alcohol Use: No  . Drug Use: No  . Sexual Activity: Not Currently   Other Topics Concern  . Not on file   Social History Narrative    REVIEW OF SYSTEMS: Constitutional: No fevers, chills, or sweats, no generalized fatigue, change in appetite Eyes: s/p left cataract surgery this morning Ear, nose and throat: No hearing loss, ear pain, nasal congestion, sore throat Cardiovascular: No chest pain, palpitations Respiratory:  No shortness of breath at rest or with exertion, wheezes GastrointestinaI: No nausea, vomiting, diarrhea, abdominal pain, fecal incontinence Genitourinary:  No dysuria, urinary retention or frequency Musculoskeletal:  No neck pain, back pain Integumentary: No rash, pruritus, skin lesions Neurological: as above Psychiatric: No depression, insomnia, anxiety Endocrine: No palpitations, fatigue, diaphoresis, mood swings, change in appetite, change in weight, increased thirst Hematologic/Lymphatic:  No anemia, purpura, petechiae. Allergic/Immunologic: no itchy/runny eyes, nasal congestion, recent allergic reactions, rashes  PHYSICAL EXAM: Filed Vitals:   07/04/15 1314  BP: 112/60  Pulse: 71   General: No acute distress Head: Normocephalic/atraumatic Neck: supple, no paraspinal tenderness, full range of motion Heart: Regular rate and rhythm Lungs: Clear to auscultation bilaterally Back: No paraspinal tenderness Skin/Extremities: No rash, no edema, s/p BKA bilaterally Neurological Exam:alert and oriented to person, place, time. No dysarthria or aphasia,  Fund of knowledge is appropriate. Remote and recent recall intact.Attention and concentration are normal. Able to name objects and repeat phrases. Cranial nerves:  CN I: not tested  CN II: PERRLA, visual fields intact CN III, IV, VI: full range of motion, no nystagmus, no ptosis  CN V: facial sensation intact  CN VII: no facial asymmetry CN VIII: hearing intact to finger rub  CN IX, X: gag intact, uvula midline  CN XI: sternocleidomastoid and trapezius muscles intact  CN XII: tongue midline  Bulk & Tone: normal, no fasciculations.  Motor: 5/5 on both UE and  both proximal LE, she is s/p amputation below knees bilaterally Sensation: intact to light touch. No extinction to double simultaneous stimulation.  Deep Tendon Reflexes: +2 on both UE, unable to elicit in both knees  Plantar responses: not tested  Cerebellar: no incoordination on finger to nose testing  Gait: able to ambulate with prosthetic legs without walker Tremor: none   IMPRESSION:  This is a 60 yo RH woman with a history of diabetes, hypertension, hyperlipidemia, peripheral vascular disease s/p right carotid endarterectomy, CAD s/p CABG, right BKA, nocturnal convulsion in 2009. She underwent left transtibial amputation on 08/22/13 then had a seizure on 08/25/13. MRI brain showed a lesion in the left frontal and parietal white matter, concerning for vasogenic edema. LP unremarkable, including cytology. Repeat MRI brain 2 months later showed complete resolution of abnormal signal. Considerations include cerebritis, venous thrombosis without infarction. She was noted to have hypertensive emergency with the first seizure, although asymmetric and more frontal, atypical PRES is another consideration, particularly with complete resolution on MRI. No further seizures since end of June 2015. We discussed potential taper off of Keppra XR 1500mg  qhs after 2 years seizure-free, including risks for breakthrough seizure with  medication adjustments, she would like to continue medication for now. She had an excellent response to Lyrica for phantom limb pain and only takes it three times a week now. She does not drive and is aware of [place] driving laws to stop driving after a seizure until 6 months seizure-free. She will discuss mood concerns with her PCP. She will follow-up in 5 months and knows to call our office for any problems in the interim.   Thank you for allowing me to participate in her care.  Please do not hesitate to call for any questions or concerns.  The duration of this appointment visit was 25 minutes of face-to-face time with the patient.  Greater than 50% of this time was spent in counseling, explanation of diagnosis, planning of further management, and coordination of care.   Ellouise Newer, M.D.   CC: Dr. Amanda Cockayne

## 2015-07-04 NOTE — Patient Instructions (Addendum)
1. Continue Keppra XR 750mg : Take 2 tablets daily 2. Continue Lyrica for phantom limb pain 3. Discuss mood with your PCP 4. Follow-up in 5 months, call for any changes  Seizure Precautions: 1. If medication has been prescribed for you to prevent seizures, take it exactly as directed.  Do not stop taking the medicine without talking to your doctor first, even if you have not had a seizure in a long time.   2. Avoid activities in which a seizure would cause danger to yourself or to others.  Don't operate dangerous machinery, swim alone, or climb in high or dangerous places, such as on ladders, roofs, or girders.  Do not drive unless your doctor says you may.  3. If you have any warning that you may have a seizure, lay down in a safe place where you can't hurt yourself.    4.  No driving for 6 months from last seizure, as per [facility].   Please refer to the following link on the [place] website for more information: http://[email or web address]/[email or web address]/drivingu.cfm   5.  Maintain good sleep hygiene. Avoid alcohol.  6.  Contact your doctor if you have any problems that may be related to the medicine you are taking.  7.  Call 911 and bring the patient back to the ED if:        A.  The seizure lasts longer than 5 minutes.       B.  The patient doesn't awaken shortly after the seizure  C.  The patient has new problems such as difficulty seeing, speaking or moving  D.  The patient was injured during the seizure  E.  The patient has a temperature over 102 F (39C)  F.  The patient vomited and now is having trouble breathing

## 2015-07-15 ENCOUNTER — Encounter: Payer: Self-pay | Admitting: Neurology

## 2015-08-10 DIAGNOSIS — E1140 Type 2 diabetes mellitus with diabetic neuropathy, unspecified: Secondary | ICD-10-CM | POA: Diagnosis not present

## 2015-08-10 DIAGNOSIS — G546 Phantom limb syndrome with pain: Secondary | ICD-10-CM | POA: Diagnosis not present

## 2015-08-10 DIAGNOSIS — E7800 Pure hypercholesterolemia, unspecified: Secondary | ICD-10-CM | POA: Diagnosis not present

## 2015-08-10 DIAGNOSIS — I10 Essential (primary) hypertension: Secondary | ICD-10-CM | POA: Diagnosis not present

## 2015-08-22 DIAGNOSIS — R739 Hyperglycemia, unspecified: Secondary | ICD-10-CM | POA: Diagnosis not present

## 2015-08-22 DIAGNOSIS — S2096XA Insect bite (nonvenomous) of unspecified parts of thorax, initial encounter: Secondary | ICD-10-CM | POA: Diagnosis not present

## 2015-08-22 DIAGNOSIS — J209 Acute bronchitis, unspecified: Secondary | ICD-10-CM | POA: Diagnosis not present

## 2015-08-22 DIAGNOSIS — R5381 Other malaise: Secondary | ICD-10-CM | POA: Diagnosis not present

## 2015-09-06 DIAGNOSIS — E1165 Type 2 diabetes mellitus with hyperglycemia: Secondary | ICD-10-CM | POA: Diagnosis not present

## 2015-09-06 DIAGNOSIS — E785 Hyperlipidemia, unspecified: Secondary | ICD-10-CM | POA: Diagnosis not present

## 2015-09-06 DIAGNOSIS — E559 Vitamin D deficiency, unspecified: Secondary | ICD-10-CM | POA: Diagnosis not present

## 2015-09-06 DIAGNOSIS — I10 Essential (primary) hypertension: Secondary | ICD-10-CM | POA: Diagnosis not present

## 2015-09-25 DIAGNOSIS — E1141 Type 2 diabetes mellitus with diabetic mononeuropathy: Secondary | ICD-10-CM | POA: Diagnosis not present

## 2015-09-25 DIAGNOSIS — G894 Chronic pain syndrome: Secondary | ICD-10-CM | POA: Diagnosis not present

## 2015-09-25 DIAGNOSIS — Z89612 Acquired absence of left leg above knee: Secondary | ICD-10-CM | POA: Insufficient documentation

## 2015-09-25 DIAGNOSIS — F419 Anxiety disorder, unspecified: Secondary | ICD-10-CM | POA: Insufficient documentation

## 2015-09-25 DIAGNOSIS — I10 Essential (primary) hypertension: Secondary | ICD-10-CM | POA: Diagnosis not present

## 2015-09-25 DIAGNOSIS — F325 Major depressive disorder, single episode, in full remission: Secondary | ICD-10-CM | POA: Insufficient documentation

## 2015-09-25 DIAGNOSIS — G629 Polyneuropathy, unspecified: Secondary | ICD-10-CM | POA: Insufficient documentation

## 2015-09-25 DIAGNOSIS — I2510 Atherosclerotic heart disease of native coronary artery without angina pectoris: Secondary | ICD-10-CM | POA: Insufficient documentation

## 2015-09-25 DIAGNOSIS — Z79899 Other long term (current) drug therapy: Secondary | ICD-10-CM | POA: Insufficient documentation

## 2015-09-25 HISTORY — DX: Chronic pain syndrome: G89.4

## 2015-09-25 HISTORY — DX: Atherosclerotic heart disease of native coronary artery without angina pectoris: I25.10

## 2015-10-23 DIAGNOSIS — F329 Major depressive disorder, single episode, unspecified: Secondary | ICD-10-CM | POA: Diagnosis not present

## 2015-10-23 DIAGNOSIS — Z79899 Other long term (current) drug therapy: Secondary | ICD-10-CM | POA: Diagnosis not present

## 2015-10-23 DIAGNOSIS — G629 Polyneuropathy, unspecified: Secondary | ICD-10-CM | POA: Diagnosis not present

## 2015-10-23 DIAGNOSIS — E785 Hyperlipidemia, unspecified: Secondary | ICD-10-CM | POA: Diagnosis not present

## 2015-10-23 DIAGNOSIS — Z833 Family history of diabetes mellitus: Secondary | ICD-10-CM | POA: Diagnosis not present

## 2015-10-23 DIAGNOSIS — E1142 Type 2 diabetes mellitus with diabetic polyneuropathy: Secondary | ICD-10-CM | POA: Diagnosis not present

## 2015-10-23 DIAGNOSIS — I10 Essential (primary) hypertension: Secondary | ICD-10-CM | POA: Diagnosis not present

## 2015-10-23 DIAGNOSIS — I2510 Atherosclerotic heart disease of native coronary artery without angina pectoris: Secondary | ICD-10-CM | POA: Diagnosis not present

## 2015-10-23 DIAGNOSIS — Z951 Presence of aortocoronary bypass graft: Secondary | ICD-10-CM | POA: Diagnosis not present

## 2015-10-23 DIAGNOSIS — Z955 Presence of coronary angioplasty implant and graft: Secondary | ICD-10-CM | POA: Diagnosis not present

## 2015-10-23 DIAGNOSIS — G546 Phantom limb syndrome with pain: Secondary | ICD-10-CM | POA: Diagnosis not present

## 2015-10-23 DIAGNOSIS — Z794 Long term (current) use of insulin: Secondary | ICD-10-CM | POA: Diagnosis not present

## 2015-10-23 DIAGNOSIS — G894 Chronic pain syndrome: Secondary | ICD-10-CM | POA: Diagnosis not present

## 2015-10-23 DIAGNOSIS — F419 Anxiety disorder, unspecified: Secondary | ICD-10-CM | POA: Diagnosis not present

## 2015-10-23 DIAGNOSIS — Z89512 Acquired absence of left leg below knee: Secondary | ICD-10-CM | POA: Diagnosis not present

## 2015-10-23 DIAGNOSIS — Z8249 Family history of ischemic heart disease and other diseases of the circulatory system: Secondary | ICD-10-CM | POA: Diagnosis not present

## 2015-10-23 DIAGNOSIS — Z89511 Acquired absence of right leg below knee: Secondary | ICD-10-CM | POA: Diagnosis not present

## 2015-10-23 DIAGNOSIS — R569 Unspecified convulsions: Secondary | ICD-10-CM | POA: Diagnosis not present

## 2015-10-25 DIAGNOSIS — R0989 Other specified symptoms and signs involving the circulatory and respiratory systems: Secondary | ICD-10-CM | POA: Diagnosis not present

## 2015-10-25 DIAGNOSIS — I10 Essential (primary) hypertension: Secondary | ICD-10-CM | POA: Diagnosis not present

## 2015-10-25 DIAGNOSIS — E782 Mixed hyperlipidemia: Secondary | ICD-10-CM | POA: Diagnosis not present

## 2015-10-25 DIAGNOSIS — R011 Cardiac murmur, unspecified: Secondary | ICD-10-CM | POA: Diagnosis not present

## 2015-10-25 DIAGNOSIS — I2510 Atherosclerotic heart disease of native coronary artery without angina pectoris: Secondary | ICD-10-CM | POA: Diagnosis not present

## 2015-10-29 DIAGNOSIS — I2510 Atherosclerotic heart disease of native coronary artery without angina pectoris: Secondary | ICD-10-CM | POA: Insufficient documentation

## 2015-10-29 DIAGNOSIS — R0989 Other specified symptoms and signs involving the circulatory and respiratory systems: Secondary | ICD-10-CM | POA: Insufficient documentation

## 2015-10-29 DIAGNOSIS — R011 Cardiac murmur, unspecified: Secondary | ICD-10-CM | POA: Insufficient documentation

## 2015-11-06 ENCOUNTER — Ambulatory Visit: Payer: Medicare Other | Admitting: Neurology

## 2015-11-21 DIAGNOSIS — I700 Atherosclerosis of aorta: Secondary | ICD-10-CM | POA: Diagnosis not present

## 2015-11-21 DIAGNOSIS — I517 Cardiomegaly: Secondary | ICD-10-CM | POA: Diagnosis not present

## 2015-11-21 DIAGNOSIS — R0989 Other specified symptoms and signs involving the circulatory and respiratory systems: Secondary | ICD-10-CM | POA: Diagnosis not present

## 2015-11-21 DIAGNOSIS — I340 Nonrheumatic mitral (valve) insufficiency: Secondary | ICD-10-CM | POA: Diagnosis not present

## 2015-11-21 DIAGNOSIS — I350 Nonrheumatic aortic (valve) stenosis: Secondary | ICD-10-CM | POA: Diagnosis not present

## 2015-11-21 DIAGNOSIS — R011 Cardiac murmur, unspecified: Secondary | ICD-10-CM | POA: Diagnosis not present

## 2015-11-21 DIAGNOSIS — I342 Nonrheumatic mitral (valve) stenosis: Secondary | ICD-10-CM | POA: Diagnosis not present

## 2015-11-28 DIAGNOSIS — G894 Chronic pain syndrome: Secondary | ICD-10-CM | POA: Diagnosis not present

## 2015-11-28 DIAGNOSIS — R52 Pain, unspecified: Secondary | ICD-10-CM | POA: Diagnosis not present

## 2015-12-05 ENCOUNTER — Ambulatory Visit: Payer: Medicare Other | Admitting: Neurology

## 2015-12-11 DIAGNOSIS — I10 Essential (primary) hypertension: Secondary | ICD-10-CM | POA: Diagnosis not present

## 2015-12-11 DIAGNOSIS — Z89511 Acquired absence of right leg below knee: Secondary | ICD-10-CM | POA: Diagnosis not present

## 2015-12-11 DIAGNOSIS — G894 Chronic pain syndrome: Secondary | ICD-10-CM | POA: Diagnosis not present

## 2015-12-11 DIAGNOSIS — Z89512 Acquired absence of left leg below knee: Secondary | ICD-10-CM | POA: Diagnosis not present

## 2015-12-11 DIAGNOSIS — I2510 Atherosclerotic heart disease of native coronary artery without angina pectoris: Secondary | ICD-10-CM | POA: Diagnosis not present

## 2015-12-11 DIAGNOSIS — R569 Unspecified convulsions: Secondary | ICD-10-CM | POA: Diagnosis not present

## 2015-12-11 DIAGNOSIS — G546 Phantom limb syndrome with pain: Secondary | ICD-10-CM | POA: Diagnosis not present

## 2015-12-11 DIAGNOSIS — Z79891 Long term (current) use of opiate analgesic: Secondary | ICD-10-CM | POA: Diagnosis not present

## 2015-12-11 DIAGNOSIS — Z951 Presence of aortocoronary bypass graft: Secondary | ICD-10-CM | POA: Diagnosis not present

## 2015-12-11 DIAGNOSIS — E119 Type 2 diabetes mellitus without complications: Secondary | ICD-10-CM | POA: Diagnosis not present

## 2015-12-26 DIAGNOSIS — E1141 Type 2 diabetes mellitus with diabetic mononeuropathy: Secondary | ICD-10-CM | POA: Diagnosis not present

## 2015-12-26 DIAGNOSIS — E1122 Type 2 diabetes mellitus with diabetic chronic kidney disease: Secondary | ICD-10-CM

## 2015-12-26 DIAGNOSIS — R21 Rash and other nonspecific skin eruption: Secondary | ICD-10-CM | POA: Insufficient documentation

## 2015-12-26 DIAGNOSIS — N183 Chronic kidney disease, stage 3 unspecified: Secondary | ICD-10-CM | POA: Insufficient documentation

## 2015-12-26 HISTORY — DX: Type 2 diabetes mellitus with diabetic chronic kidney disease: E11.22

## 2016-01-07 DIAGNOSIS — G546 Phantom limb syndrome with pain: Secondary | ICD-10-CM | POA: Diagnosis not present

## 2016-02-27 ENCOUNTER — Other Ambulatory Visit: Payer: Self-pay | Admitting: Neurology

## 2016-02-27 DIAGNOSIS — G546 Phantom limb syndrome with pain: Secondary | ICD-10-CM

## 2016-02-27 NOTE — Telephone Encounter (Signed)
RX sent to pharmacy, per last OV note patient to continue.

## 2016-06-16 ENCOUNTER — Other Ambulatory Visit: Payer: Self-pay | Admitting: Neurology

## 2016-06-16 DIAGNOSIS — G546 Phantom limb syndrome with pain: Secondary | ICD-10-CM

## 2016-07-09 DIAGNOSIS — K219 Gastro-esophageal reflux disease without esophagitis: Secondary | ICD-10-CM | POA: Insufficient documentation

## 2016-07-09 DIAGNOSIS — K2100 Gastro-esophageal reflux disease with esophagitis, without bleeding: Secondary | ICD-10-CM

## 2016-07-09 HISTORY — DX: Gastro-esophageal reflux disease with esophagitis, without bleeding: K21.00

## 2016-07-16 ENCOUNTER — Other Ambulatory Visit: Payer: Self-pay | Admitting: Neurology

## 2016-07-16 DIAGNOSIS — G40209 Localization-related (focal) (partial) symptomatic epilepsy and epileptic syndromes with complex partial seizures, not intractable, without status epilepticus: Secondary | ICD-10-CM

## 2016-07-29 DIAGNOSIS — M8669 Other chronic osteomyelitis, multiple sites: Secondary | ICD-10-CM | POA: Insufficient documentation

## 2016-10-06 DIAGNOSIS — Z0289 Encounter for other administrative examinations: Secondary | ICD-10-CM | POA: Insufficient documentation

## 2016-10-07 DIAGNOSIS — N3000 Acute cystitis without hematuria: Secondary | ICD-10-CM | POA: Insufficient documentation

## 2017-05-13 ENCOUNTER — Encounter: Payer: Self-pay | Admitting: Family Medicine

## 2017-05-13 ENCOUNTER — Ambulatory Visit: Payer: Medicare Other | Admitting: Family Medicine

## 2017-05-13 VITALS — BP 118/60 | HR 61 | Ht 64.5 in

## 2017-05-13 DIAGNOSIS — I739 Peripheral vascular disease, unspecified: Secondary | ICD-10-CM

## 2017-05-13 DIAGNOSIS — I25810 Atherosclerosis of coronary artery bypass graft(s) without angina pectoris: Secondary | ICD-10-CM | POA: Diagnosis not present

## 2017-05-13 DIAGNOSIS — I10 Essential (primary) hypertension: Secondary | ICD-10-CM

## 2017-05-13 DIAGNOSIS — Z0000 Encounter for general adult medical examination without abnormal findings: Secondary | ICD-10-CM

## 2017-05-13 DIAGNOSIS — G546 Phantom limb syndrome with pain: Secondary | ICD-10-CM

## 2017-05-13 DIAGNOSIS — F341 Dysthymic disorder: Secondary | ICD-10-CM | POA: Diagnosis not present

## 2017-05-13 DIAGNOSIS — K219 Gastro-esophageal reflux disease without esophagitis: Secondary | ICD-10-CM | POA: Diagnosis not present

## 2017-05-13 DIAGNOSIS — R569 Unspecified convulsions: Secondary | ICD-10-CM | POA: Diagnosis not present

## 2017-05-13 DIAGNOSIS — Z794 Long term (current) use of insulin: Secondary | ICD-10-CM

## 2017-05-13 DIAGNOSIS — G40209 Localization-related (focal) (partial) symptomatic epilepsy and epileptic syndromes with complex partial seizures, not intractable, without status epilepticus: Secondary | ICD-10-CM

## 2017-05-13 DIAGNOSIS — I7025 Atherosclerosis of native arteries of other extremities with ulceration: Secondary | ICD-10-CM

## 2017-05-13 DIAGNOSIS — E118 Type 2 diabetes mellitus with unspecified complications: Secondary | ICD-10-CM | POA: Diagnosis not present

## 2017-05-13 DIAGNOSIS — J4520 Mild intermittent asthma, uncomplicated: Secondary | ICD-10-CM

## 2017-05-13 MED ORDER — QUINAPRIL HCL 40 MG PO TABS: 40.0000 mg | ORAL_TABLET | Freq: Every day | ORAL | 0 refills | Status: DC

## 2017-05-13 MED ORDER — SERTRALINE HCL 100 MG PO TABS: 100.0000 mg | ORAL_TABLET | Freq: Two times a day (BID) | ORAL | 0 refills | Status: AC

## 2017-05-13 MED ORDER — LEVETIRACETAM ER 750 MG PO TB24: 1500.0000 mg | ORAL_TABLET | Freq: Every day | ORAL | 3 refills | Status: DC

## 2017-05-13 MED ORDER — ASPIRIN 81 MG PO TABS: 81.0000 mg | ORAL_TABLET | Freq: Every day | ORAL | 0 refills | Status: AC

## 2017-05-13 MED ORDER — ATORVASTATIN CALCIUM 80 MG PO TABS: 80.0000 mg | ORAL_TABLET | Freq: Every day | ORAL | 0 refills | Status: DC

## 2017-05-13 MED ORDER — AMLODIPINE BESYLATE 5 MG PO TABS: 10.0000 mg | ORAL_TABLET | Freq: Every day | ORAL | 0 refills | Status: DC

## 2017-05-13 MED ORDER — RANITIDINE HCL 150 MG PO TABS: 150.0000 mg | ORAL_TABLET | Freq: Two times a day (BID) | ORAL | 0 refills | Status: DC

## 2017-05-13 MED ORDER — CARVEDILOL 25 MG PO TABS: 25.0000 mg | ORAL_TABLET | Freq: Every day | ORAL | 0 refills | Status: DC

## 2017-05-13 MED ORDER — ALBUTEROL SULFATE HFA 108 (90 BASE) MCG/ACT IN AERS: 1.0000 | INHALATION_SPRAY | Freq: Four times a day (QID) | RESPIRATORY_TRACT | 3 refills | Status: DC | PRN

## 2017-05-13 MED ORDER — CLOPIDOGREL BISULFATE 75 MG PO TABS: 75.0000 mg | ORAL_TABLET | Freq: Every day | ORAL | 0 refills | Status: DC

## 2017-05-13 MED ORDER — INSULIN NPH (HUMAN) (ISOPHANE) 100 UNIT/ML ~~LOC~~ SUSP
SUBCUTANEOUS | 3 refills | Status: DC
Start: 2017-05-13 — End: 2018-08-11

## 2017-05-13 NOTE — Progress Notes (Addendum)
Subjective:  Patient ID: [name], female    DOB: [birth year]  Age: 62 y.o. MRN: [id number]  CC: New Patient (Initial Visit)   HPI [name] presents for establishment of care.  She is a patient with multiple medical issues to include coronary artery disease, carotid artery disease peripheral vascular disease, hypertension, diabetes, anxiety depression, asthma, bilateral below-knee amputation.  For diabetes she is taking 30 units of NPH insulin in the morning and 40 units of NPH insulin in the evening.  She suggests the evening dose of NPH according to the fasting morning blood sugars.  She does not adjust her 30 units does in the morning.  Last hemoglobin A1c back in August was 7.1.  She had taken Actos and Victoza in the past but did not tolerate these medicines.  She is currently being seen at a pain management clinic in [facility]  They have been managing her chronic pain with Lyrica.  She has been taking methadone clinic notes indicate that this drug has been discontinued.  She was given a one-time prescription for lorazepam back in August or acute anxiety.  She otherwise takes high-dose Zoloft 100 mg twice daily.  She has not seen a cardiologist or an endocrinologist.  She requests refills on her medicines.  She is nonfasting today. Patient asked about how I seemed to know as much about her as I did. I told her that I had reviewed her medical records. She was upset and said that she felt as though her confidentiality had been compromised and I assured her that only medical personal have access to her records.  History [name] has a past medical history of Anemia, Anxiety, Arthritis, CHF (congestive heart failure) ([place]), Complication of anesthesia, Coronary artery disease, Depression, GERD (gastroesophageal reflux disease), High cholesterol, History of blood transfusion, Hypertension, Neuropathy, Peripheral vascular disease ([place]), Seizures ([place]), and Type II diabetes mellitus ([place]).    She has a past surgical history that includes Toe amputation (Right); Below knee leg amputation (Right, Jan. 2008); Carotid endarterectomy (Right); Cholecystectomy; Ankle Fusion (Left, 05/11/2013); Tonsillectomy; Dilation and curettage of uterus; Coronary artery bypass graft (12/01/2007); Ankle fusion (Left, 05/12/2013); Colonoscopy; Irrigation and debridement abscess (Left, 06/17/2013); I&D extremity (Left, 06/17/2013); Application if wound vac (06/17/2013); Amputation (Left, 08/22/2013); and Cataract extraction (Left, 12/25/14).   Her family history includes Cancer in her mother; Diabetes in her father; Heart disease in her father; Hyperlipidemia in her father; Hypertension in her father.She reports that  has never smoked. she has never used smokeless tobacco. She reports that she does not drink alcohol or use drugs.  Outpatient Medications Prior to Visit  Medication Sig Dispense Refill  . diclofenac sodium (VOLTAREN) 1 % GEL Apply topically.    Marland Kitchen LORazepam (ATIVAN) 1 MG tablet Take 1 mg by mouth at bedtime as needed.  5  . Multiple Vitamins-Minerals (MULTIVITAMINS THER. W/MINERALS) TABS Take 1 tablet by mouth daily.      . pregabalin (LYRICA) 50 MG capsule Take 1 capsule (50 mg total) by mouth 3 (three) times daily. No future medication refills until scheduled follow up office visit. LOV: 07/04/15. 90 capsule 0  . albuterol (PROAIR HFA) 108 (90 Base) MCG/ACT inhaler Inhale into the lungs.    Marland Kitchen amLODipine (NORVASC) 5 MG tablet TAKE 2 TABLETS BY MOUTH EVERY DAY    . aspirin 81 MG tablet Take 81 mg by mouth daily.      Marland Kitchen atorvastatin (LIPITOR) 80 MG tablet Take 80 mg by mouth daily.      Marland Kitchen  carvedilol (COREG) 25 MG tablet Take 25 mg by mouth.    . clopidogrel (PLAVIX) 75 MG tablet Take 75 mg by mouth daily.     . insulin NPH Human (HUMULIN N,NOVOLIN N) 100 UNIT/ML injection Inject 25-35 Units into the skin 2 (two) times daily before a meal. 35 units in the morning and 25 units in the evening    .  Levetiracetam 750 MG TB24 TAKE 2 TABLETS (1,500 MG TOTAL) BY MOUTH DAILY. 180 tablet 3  . quinapril (ACCUPRIL) 40 MG tablet Take 40 mg by mouth daily.     . ranitidine (ZANTAC) 150 MG tablet Take 150 mg by mouth 2 (two) times daily.     . sertraline (ZOLOFT) 100 MG tablet Take 100 mg by mouth 2 (two) times daily.     Marland Kitchen HYDROcodone-acetaminophen (NORCO/VICODIN) 5-325 MG tablet Take 1 tablet by mouth every 6 (six) hours as needed for moderate pain.    Marland Kitchen amitriptyline (ELAVIL) 25 MG tablet Take 25 mg by mouth at bedtime as needed for sleep.     . metoprolol tartrate (LOPRESSOR) 50 MG tablet Take 0.5 tablets (25 mg total) by mouth 2 (two) times daily. (Patient not taking: Reported on 05/13/2017) 60 tablet 0   No facility-administered medications prior to visit.     ROS Review of Systems  Constitutional: Negative.  Negative for unexpected weight change.  HENT: Negative.   Eyes: Negative.   Respiratory: Negative.   Cardiovascular: Negative.   Gastrointestinal: Negative.   Endocrine: Negative for polyphagia and polyuria.  Genitourinary: Negative.   Musculoskeletal: Positive for gait problem.  Skin: Negative for rash and wound.  Allergic/Immunologic: Negative for immunocompromised state.  Neurological: Positive for seizures and numbness.  Hematological: Does not bruise/bleed easily.  Psychiatric/Behavioral: Positive for dysphoric mood. [name] nervous/anxious.     Objective:  BP 118/60 (BP Location: Right Arm, Patient Position: Sitting, Cuff Size: Normal)   Pulse 61   Ht 5' 4.5" (1.638 m)   BMI 32.79 kg/m   Physical Exam  Constitutional: She is oriented to person, place, and time. She appears well-developed and well-nourished. No distress.  HENT:  Head: Normocephalic and atraumatic.  Right Ear: External ear normal.  Left Ear: External ear normal.  Mouth/Throat: Oropharynx is clear and moist. No oropharyngeal exudate.  Eyes: Conjunctivae are normal. Pupils are equal, round,  and reactive to light. Right eye exhibits no discharge. Left eye exhibits no discharge. No scleral icterus.  Neck: Neck supple. No JVD present. No tracheal deviation present. No thyromegaly present.  Cardiovascular: Normal rate, regular rhythm and normal heart sounds.  Pulmonary/Chest: Effort normal and breath sounds normal. No stridor.  Lymphadenopathy:    She has no cervical adenopathy.  Neurological: She is alert and oriented to person, place, and time.  Skin: Skin is warm and dry. She is not diaphoretic.  Psychiatric: She has a normal mood and affect. Her behavior is normal.      Assessment & Plan:   [name] was seen today for new patient (initial visit).  Diagnoses and all orders for this visit:  HYPERTENSION, BENIGN -     amLODipine (NORVASC) 5 MG tablet; Take 2 tablets (10 mg total) by mouth daily. -     carvedilol (COREG) 25 MG tablet; Take 1 tablet (25 mg total) by mouth daily. -     quinapril (ACCUPRIL) 40 MG tablet; Take 1 tablet (40 mg total) by mouth daily. -     CBC; Future -     Comprehensive  metabolic panel; Future -     Lipid panel; Future -     Urinalysis, Routine w reflex microscopic; Future -     TSH; Future  Atherosclerosis of coronary artery bypass graft of native heart without angina pectoris -     amLODipine (NORVASC) 5 MG tablet; Take 2 tablets (10 mg total) by mouth daily. -     atorvastatin (LIPITOR) 80 MG tablet; Take 1 tablet (80 mg total) by mouth daily. -     carvedilol (COREG) 25 MG tablet; Take 1 tablet (25 mg total) by mouth daily. -     aspirin 81 MG tablet; Take 1 tablet (81 mg total) by mouth daily. -     clopidogrel (PLAVIX) 75 MG tablet; Take 1 tablet (75 mg total) by mouth daily. -     Ambulatory referral to Cardiology -     CBC; Future -     Comprehensive metabolic panel; Future -     Lipid panel; Future -     TSH; Future  PVD (peripheral vascular disease) (HCC) -     amLODipine (NORVASC) 5 MG tablet; Take 2 tablets (10 mg total) by  mouth daily. -     atorvastatin (LIPITOR) 80 MG tablet; Take 1 tablet (80 mg total) by mouth daily. -     carvedilol (COREG) 25 MG tablet; Take 1 tablet (25 mg total) by mouth daily. -     aspirin 81 MG tablet; Take 1 tablet (81 mg total) by mouth daily. -     clopidogrel (PLAVIX) 75 MG tablet; Take 1 tablet (75 mg total) by mouth daily. -     Ambulatory referral to Cardiology -     CBC; Future -     Comprehensive metabolic panel; Future -     Lipid panel; Future -     TSH; Future  Phantom limb pain ([place]) -     Ambulatory referral to Pain Clinic  Atherosclerosis of native arteries of the extremities with ulceration (HCC) -     amLODipine (NORVASC) 5 MG tablet; Take 2 tablets (10 mg total) by mouth daily. -     atorvastatin (LIPITOR) 80 MG tablet; Take 1 tablet (80 mg total) by mouth daily. -     carvedilol (COREG) 25 MG tablet; Take 1 tablet (25 mg total) by mouth daily. -     aspirin 81 MG tablet; Take 1 tablet (81 mg total) by mouth daily. -     clopidogrel (PLAVIX) 75 MG tablet; Take 1 tablet (75 mg total) by mouth daily. -     Ambulatory referral to Cardiology -     CBC; Future -     Comprehensive metabolic panel; Future -     Lipid panel; Future  Controlled type 2 diabetes mellitus with complication, with long-term current use of insulin (HCC) -     insulin NPH Human (HUMULIN N,NOVOLIN N) 100 UNIT/ML injection; 30 Units in AM and 40U in PM -     quinapril (ACCUPRIL) 40 MG tablet; Take 1 tablet (40 mg total) by mouth daily. -     Ambulatory referral to Endocrinology -     CBC; Future -     Comprehensive metabolic panel; Future -     Hemoglobin A1c; Future -     Microalbumin / creatinine urine ratio; Future -     Urinalysis, Routine w reflex microscopic; Future  Seizure (HCC) -     Levetiracetam 750 MG TB24; Take 2 tablets (1,500 mg total) by  mouth daily. -     CBC; Future  Localization-related symptomatic epilepsy and epileptic syndromes with complex partial seizures, not  intractable, without status epilepticus (HCC) -     Levetiracetam 750 MG TB24; Take 2 tablets (1,500 mg total) by mouth daily. -     CBC; Future  ANXIETY DEPRESSION -     sertraline (ZOLOFT) 100 MG tablet; Take 1 tablet (100 mg total) by mouth 2 (two) times daily.  Gastroesophageal reflux disease, esophagitis presence not specified -     ranitidine (ZANTAC) 150 MG tablet; Take 1 tablet (150 mg total) by mouth 2 (two) times daily.  Mild intermittent asthma, unspecified whether complicated -     albuterol (PROAIR HFA) 108 (90 Base) MCG/ACT inhaler; Inhale 1-2 puffs into the lungs every 6 (six) hours as needed for wheezing or shortness of breath. -     CBC; Future  Health care maintenance -     HIV antibody; Future  She will return fasting for blood work.  Went ahead and gave her a 90-day supply of medicines.  I would like to send her to endocrinology to be switched from NPH to Lantus as this is a much safer alternative for her.  Would also like their input on other ideas or improved glycemic control.  Her blood pressure is well controlled today on her above listed medicines.  She has extensive vascular disease and is on high-dose Lipitor.  She has a history of seizure disorder and asthma that have been multiple with a rescue inhaler in.  We will continue those medicines for now.  She requests a refill of hydrocodone.  Review of the database shows that she has not been taking this medicine.  She requests a referral to a new pain management clinic.  In the meantime she will follow-up with her existing pain management clinic in [facility].  I have discontinued [name]'s amitriptyline and metoprolol tartrate. I have also changed her amLODipine, atorvastatin, carvedilol, aspirin, clopidogrel, insulin NPH Human, quinapril, ranitidine, sertraline, and albuterol. Additionally, I am having her maintain her multivitamins ther. w/minerals, LORazepam, pregabalin, diclofenac sodium,  HYDROcodone-acetaminophen, and Levetiracetam.  Meds ordered this encounter  Medications  . amLODipine (NORVASC) 5 MG tablet    Sig: Take 2 tablets (10 mg total) by mouth daily.    Dispense:  180 tablet    Refill:  0  . atorvastatin (LIPITOR) 80 MG tablet    Sig: Take 1 tablet (80 mg total) by mouth daily.    Dispense:  90 tablet    Refill:  0  . carvedilol (COREG) 25 MG tablet    Sig: Take 1 tablet (25 mg total) by mouth daily.    Dispense:  90 tablet    Refill:  0  . aspirin 81 MG tablet    Sig: Take 1 tablet (81 mg total) by mouth daily.    Dispense:  90 tablet    Refill:  0  . clopidogrel (PLAVIX) 75 MG tablet    Sig: Take 1 tablet (75 mg total) by mouth daily.    Dispense:  90 tablet    Refill:  0  . insulin NPH Human (HUMULIN N,NOVOLIN N) 100 UNIT/ML injection    Sig: 30 Units in AM and 40U in PM    Dispense:  10 mL    Refill:  3  . Levetiracetam 750 MG TB24    Sig: Take 2 tablets (1,500 mg total) by mouth daily.    Dispense:  180 tablet  Refill:  3  . quinapril (ACCUPRIL) 40 MG tablet    Sig: Take 1 tablet (40 mg total) by mouth daily.    Dispense:  90 tablet    Refill:  0  . ranitidine (ZANTAC) 150 MG tablet    Sig: Take 1 tablet (150 mg total) by mouth 2 (two) times daily.    Dispense:  180 tablet    Refill:  0  . sertraline (ZOLOFT) 100 MG tablet    Sig: Take 1 tablet (100 mg total) by mouth 2 (two) times daily.    Dispense:  180 tablet    Refill:  0  . albuterol (PROAIR HFA) 108 (90 Base) MCG/ACT inhaler    Sig: Inhale 1-2 puffs into the lungs every 6 (six) hours as needed for wheezing or shortness of breath.    Dispense:  1 Inhaler    Refill:  3     Follow-up: Return in about 1 month (around 06/10/2017).  Libby Maw, MD

## 2017-06-05 ENCOUNTER — Encounter: Payer: Self-pay | Admitting: Neurology

## 2017-06-05 ENCOUNTER — Other Ambulatory Visit: Payer: Self-pay

## 2017-06-05 ENCOUNTER — Ambulatory Visit: Payer: Medicare Other | Admitting: Neurology

## 2017-06-05 VITALS — BP 150/58 | HR 75 | Ht 65.0 in | Wt 190.0 lb

## 2017-06-05 DIAGNOSIS — G40209 Localization-related (focal) (partial) symptomatic epilepsy and epileptic syndromes with complex partial seizures, not intractable, without status epilepticus: Secondary | ICD-10-CM

## 2017-06-05 DIAGNOSIS — G546 Phantom limb syndrome with pain: Secondary | ICD-10-CM

## 2017-06-05 DIAGNOSIS — R569 Unspecified convulsions: Secondary | ICD-10-CM

## 2017-06-05 MED ORDER — PREGABALIN 50 MG PO CAPS: ORAL_CAPSULE | ORAL | 3 refills | Status: DC

## 2017-06-05 MED ORDER — LEVETIRACETAM ER 750 MG PO TB24: 1500.0000 mg | ORAL_TABLET | Freq: Every day | ORAL | 3 refills | Status: DC

## 2017-06-05 NOTE — Progress Notes (Signed)
NEUROLOGY FOLLOW UP OFFICE NOTE  TAMMATHA COBB [id number]  DOB: 1955/08/10  HISTORY OF PRESENT ILLNESS: I had the pleasure of seeing Jeanenne Licea in follow-up in the neurology clinic on 06/12/2017.  The patient was last seen 2 years ago for seizures. She is accompanied by her 2 sons who help supplement the history today. She had seizures in June 2015 in the setting of abnormal brain MRI. There was complete resolution of abnormal signal 2 months after. Radiology noted that findings could have been due to cerebritis or conceivably due to venous thrombosis without infarction. I wonder about PRES. She has been taking Keppra XR 1500mg  daily with no further seizures or seizure-like symptoms since June 2015. She takes Lyrica 50mg  at bedtime as needed for left ankle phantom limb pain, she takes it twice a week when pain is really bad. She has occasional sinus headaches. Her sons deny any staring/unresponsive/confusional episodes. She fell while using her walker 2 months ago, she slid sideways. Otherwise she denies any dizziness, diplopia, focal numbness/tingling/weakness. She feels the best she has felt in the past few months.   HPI: This is a 62 yo RH woman with a history of diabetes, hypertension, hyperlipidemia, peripheral vascular disease s/p right carotid endarterectomy, CAD s/p CABG, and right BKA secondary to diabetes. She was admitted for left foot osteomyelitis due to poor healing wound after a fall last winter. She underwent left transtibial amputation on 08/22/13. She was discharged home on 06/11, then started having altered mental status on 06/15. Her son reports she had hit her head in the left frontal region when getting into their car, then started acting drowsy and out of it the next day. That evening, they were getting ready to eat when her right arm flexed and clenched into her chest followed by head turn to the left,stiffening and shaking for 1 minute as she fell to her left side. She  was confused for 10 minutes, back to baseline within 30 minutes. She was brought to [facility] ER where BP was noted to be 205/87. She was given IV labetalol with slow improvement in BP. Blood sugar noted to be 303. Head CT showed low attenuation in the left corona radiata and centrum semiovale. She had an MRI brain initially without contrast which showed increased T2 and FLAIR signal in the left hemisphere from the anterior frontal cortex, supplemental motor area, extending down to the posterior limb of the internal capsule. She became agitated and needed sedation for MRI brain with contrast, however again became restless with motion degradation, no abnormal enhancement seen. Left ICA flow void was noted to be absent, similar since 2009. It was felt that symptoms were not consistent with left MCA ischemia and concern was for viral CNS infection and received 4 days of empiric IV Acyclovir. She underwent a lumbar puncture which showed WBC 1, RBC 17, glucose 82, protein 29, VDRL, CMV, HSV, arbovirus, gram stain and culture negative. No malignant cells on cytology. She was started on Keppra with no further seizures, discharged home on 06/18. She was apparently intermittently confused at home, woke up better then next day, then had another seizure that morning. Her son today reports she had a generalized convulsion with head deviation to left for 5 minutes. On arrival to the ER, per records, she was poorly responsive with right gaze deviation, nystagmoid eye movements. BP 197/80 She was given IV Ativan, according to her son she remained in this state for an hour, then was unresponsive for 36  hours after. Prolonged EEG showed diffuse slowing, no electrographic seizures or epileptiform discharges. Repeat MRI brain with and without contrast was again degraded by motion. Similar abnormal signal in the left frontal and parietal lobes again noted, predominantly in the centrum semiovale extending into the subcortical white matter.  There is an area of questionable thickening in the left frontal cortex noted to be unchanged, no effacement of sulci in this area, no restricted diffusion, no abnormal enhancement although study is suboptimal. Chronically occluded left ICA again noted. She was discharged home on Keppra XR 1500mg /day. Repeat MRI 2 months later showed complete resolution of abnormal signal in the left frontal and parietal regions. There was mild chronic small vessel disease bilaterally. Per report, findings could have been due to cerebritis or conceivably due to venous thrombosis without infarction.   Of note, in 2009, she had a nocturnal convulsion and was admitted to [facility]. Records unavailable for review, according to her son she was not started on seizure medication due to first seizure. She had an MRI brain at that time which I personally reviewed, it was noted to be limited by motion and patient refusing to complete study, however on FLAIR sequences, white matter changes in the left hemisphere are not present.   Epilepsy Risk Factors: Left frontal white matter lesion that has resolved. Otherwise she had a normal birth and early development. There is no history of febrile convulsions, significant traumatic brain injury, neurosurgical procedures, or family history of seizures.   PAST MEDICAL HISTORY: Past Medical History:  Diagnosis Date  . Anemia   . Anxiety   . Arthritis    "hands"  . CHF (congestive heart failure) ([place])   . Complication of anesthesia    pt felt like she had a hard time waking up after surgery in Feb. 2015.  Marland Kitchen Coronary artery disease   . Depression   . GERD (gastroesophageal reflux disease)    takes Zantac  . High cholesterol   . History of blood transfusion    "one time; related to diabetes I guess"   . Hypertension   . Neuropathy   . Peripheral vascular disease ([place])   . Seizures ([place])   . Type II diabetes mellitus ([facility])     MEDICATIONS: Current Outpatient Medications on File Prior  to Visit  Medication Sig Dispense Refill  . albuterol (PROAIR HFA) 108 (90 Base) MCG/ACT inhaler Inhale 1-2 puffs into the lungs every 6 (six) hours as needed for wheezing or shortness of breath. 1 Inhaler 3  . amLODipine (NORVASC) 5 MG tablet Take 2 tablets (10 mg total) by mouth daily. 180 tablet 0  . aspirin 81 MG tablet Take 1 tablet (81 mg total) by mouth daily. 90 tablet 0  . atorvastatin (LIPITOR) 80 MG tablet Take 1 tablet (80 mg total) by mouth daily. 90 tablet 0  . carvedilol (COREG) 25 MG tablet Take 1 tablet (25 mg total) by mouth daily. 90 tablet 0  . clopidogrel (PLAVIX) 75 MG tablet Take 1 tablet (75 mg total) by mouth daily. 90 tablet 0  . diclofenac sodium (VOLTAREN) 1 % GEL Apply topically.    Marland Kitchen HYDROcodone-acetaminophen (NORCO/VICODIN) 5-325 MG tablet Take 1 tablet by mouth every 6 (six) hours as needed for moderate pain.    Marland Kitchen insulin NPH Human (HUMULIN N,NOVOLIN N) 100 UNIT/ML injection 30 Units in AM and 40U in PM 10 mL 3  . Levetiracetam 750 MG TB24 Take 2 tablets (1,500 mg total) by mouth daily. 180 tablet 3  .  LORazepam (ATIVAN) 1 MG tablet Take 1 mg by mouth at bedtime as needed.  5  . Multiple Vitamins-Minerals (MULTIVITAMINS THER. W/MINERALS) TABS Take 1 tablet by mouth daily.      . pregabalin (LYRICA) 50 MG capsule Take 1 capsule (50 mg total) by mouth 3 (three) times daily. No future medication refills until scheduled follow up office visit. LOV: 07/04/15. 90 capsule 0  . quinapril (ACCUPRIL) 40 MG tablet Take 1 tablet (40 mg total) by mouth daily. 90 tablet 0  . ranitidine (ZANTAC) 150 MG tablet Take 1 tablet (150 mg total) by mouth 2 (two) times daily. 180 tablet 0  . sertraline (ZOLOFT) 100 MG tablet Take 1 tablet (100 mg total) by mouth 2 (two) times daily. 180 tablet 0   No current facility-administered medications on file prior to visit.     ALLERGIES: Allergies  Allergen Reactions  . Tape Rash    Redness, rash, itchiness    FAMILY HISTORY: Family  History  Problem Relation Age of Onset  . Cancer Mother   . Heart disease Father   . Diabetes Father   . Hyperlipidemia Father   . Hypertension Father     SOCIAL HISTORY: Social History   Socioeconomic History  . Marital status: Married    Spouse name: Not on file  . Number of children: Not on file  . Years of education: Not on file  . Highest education level: Not on file  Occupational History  . Not on file  Social Needs  . Financial resource strain: Not on file  . Food insecurity:    Worry: Not on file    Inability: Not on file  . Transportation needs:    Medical: Not on file    Non-medical: Not on file  Tobacco Use  . Smoking status: Never Smoker  . Smokeless tobacco: Never Used  Substance and Sexual Activity  . Alcohol use: No  . Drug use: No  . Sexual activity: Not Currently  Lifestyle  . Physical activity:    Days per week: Not on file    Minutes per session: Not on file  . Stress: Not on file  Relationships  . Social connections:    Talks on phone: Not on file    Gets together: Not on file    Attends religious service: Not on file    Active member of club or organization: Not on file    Attends meetings of clubs or organizations: Not on file    Relationship status: Not on file  . Intimate partner violence:    Fear of current or ex partner: Not on file    Emotionally abused: Not on file    Physically abused: Not on file    Forced sexual activity: Not on file  Other Topics Concern  . Not on file  Social History Narrative  . Not on file    REVIEW OF SYSTEMS: Constitutional: No fevers, chills, or sweats, no generalized fatigue, change in appetite Eyes: s/p left cataract surgery this morning Ear, nose and throat: No hearing loss, ear pain, nasal congestion, sore throat Cardiovascular: No chest pain, palpitations Respiratory:  No shortness of breath at rest or with exertion, wheezes GastrointestinaI: No nausea, vomiting, diarrhea, abdominal pain,  fecal incontinence Genitourinary:  No dysuria, urinary retention or frequency Musculoskeletal:  No neck pain, back pain Integumentary: No rash, pruritus, skin lesions Neurological: as above Psychiatric: No depression, insomnia, anxiety Endocrine: No palpitations, fatigue, diaphoresis, mood swings, change in appetite, change in  weight, increased thirst Hematologic/Lymphatic:  No anemia, purpura, petechiae. Allergic/Immunologic: no itchy/runny eyes, nasal congestion, recent allergic reactions, rashes  PHYSICAL EXAM: Vitals:   06/05/17 1052  BP: (!) 150/58  Pulse: 75  SpO2: 96%   General: No acute distress Head: Normocephalic/atraumatic Neck: supple, no paraspinal tenderness, full range of motion Heart: Regular rate and rhythm Lungs: Clear to auscultation bilaterally Back: No paraspinal tenderness Skin/Extremities: No rash, no edema, s/p BKA bilaterally Neurological Exam:alert and oriented to person, place, time. No dysarthria or aphasia, Fund of knowledge is appropriate. Remote and recent recall intact.Attention and concentration are normal. Able to name objects and repeat phrases. Cranial nerves:  CN I: not tested  CN II: PERRLA, visual fields intact CN III, IV, VI: full range of motion, no nystagmus, no ptosis  CN V: facial sensation intact  CN VII: no facial asymmetry CN VIII: hearing intact to finger rub  CN IX, X: gag intact, uvula midline  CN XI: sternocleidomastoid and trapezius muscles intact  CN XII: tongue midline  Bulk & Tone: normal, no fasciculations.  Motor: 5/5 on both UE and both proximal LE, she is s/p amputation below knees bilaterally Sensation: intact to light touch. No extinction to double simultaneous stimulation.  Deep Tendon Reflexes: +2 on both UE, unable to elicit in both knees  Plantar responses: not tested  Cerebellar: no incoordination on finger to nose testing  Gait: able to ambulate with prosthetic legs without walker, no  ataxia Tremor: none   IMPRESSION:  This is a 62 yo RH woman with a history of diabetes, hypertension, hyperlipidemia, peripheral vascular disease s/p right carotid endarterectomy, CAD s/p CABG, s/p bilateral BKA, nocturnal convulsion in 2009. She underwent left transtibial amputation on 08/22/13 then had a seizure on 08/25/13. MRI brain showed a lesion in the left frontal and parietal white matter, concerning for vasogenic edema. LP unremarkable, including cytology. Repeat MRI brain 2 months later showed complete resolution of abnormal signal. Considerations include cerebritis, venous thrombosis without infarction. She was noted to have hypertensive emergency with the first seizure, although asymmetric and more frontal, atypical PRES is another consideration, particularly with complete resolution on MRI. No further seizures since end of June 2015. We again discussed potential taper off of Keppra XR 1500mg  qhs after 2 years seizure-free, including risks for breakthrough seizure with medication adjustments, she would like to continue medication for now. She will increase Lyrica to 50mg  BID for phantom limb pain. She does not drive and is aware of [place] driving laws to stop driving after a seizure until 6 months seizure-free. She will follow-up in 6 months and knows to call our office for any problems in the interim.   Thank you for allowing me to participate in her care.  Please do not hesitate to call for any questions or concerns.  The duration of this appointment visit was 25 minutes of face-to-face time with the patient.  Greater than 50% of this time was spent in counseling, explanation of diagnosis, planning of further management, and coordination of care.   Ellouise Newer, M.D.   CC: Dr. Ethelene Hal

## 2017-06-05 NOTE — Patient Instructions (Signed)
1. Continue Keppra XR 750mg : take 2 tablets at night 2. Continue Lyrica 50mg  twice a day 3. Follow-up in 6 months, call for any changes  Seizure Precautions: 1. If medication has been prescribed for you to prevent seizures, take it exactly as directed.  Do not stop taking the medicine without talking to your doctor first, even if you have not had a seizure in a long time.   2. Avoid activities in which a seizure would cause danger to yourself or to others.  Don't operate dangerous machinery, swim alone, or climb in high or dangerous places, such as on ladders, roofs, or girders.  Do not drive unless your doctor says you may.  3. If you have any warning that you may have a seizure, lay down in a safe place where you can't hurt yourself.    4.  No driving for 6 months from last seizure, as per [facility].   Please refer to the following link on the [place] website for more information: [email or web address]/[email or web address]/drivingu.cfm   5.  Maintain good sleep hygiene. Avoid alcohol.  6.  Contact your doctor if you have any problems that may be related to the medicine you are taking.  7.  Call 911 and bring the patient back to the ED if:        A.  The seizure lasts longer than 5 minutes.       B.  The patient doesn't awaken shortly after the seizure  C.  The patient has new problems such as difficulty seeing, speaking or moving  D.  The patient was injured during the seizure  E.  The patient has a temperature over 102 F (39C)  F.  The patient vomited and now is having trouble breathing

## 2017-07-01 ENCOUNTER — Ambulatory Visit: Payer: Medicare Other | Admitting: Endocrinology

## 2017-08-26 ENCOUNTER — Other Ambulatory Visit: Payer: Self-pay | Admitting: Family Medicine

## 2017-08-26 DIAGNOSIS — K219 Gastro-esophageal reflux disease without esophagitis: Secondary | ICD-10-CM

## 2017-08-27 ENCOUNTER — Other Ambulatory Visit: Payer: Self-pay

## 2017-08-27 DIAGNOSIS — I25810 Atherosclerosis of coronary artery bypass graft(s) without angina pectoris: Secondary | ICD-10-CM

## 2017-08-27 DIAGNOSIS — I7025 Atherosclerosis of native arteries of other extremities with ulceration: Secondary | ICD-10-CM

## 2017-08-27 DIAGNOSIS — I739 Peripheral vascular disease, unspecified: Secondary | ICD-10-CM

## 2017-08-27 DIAGNOSIS — I10 Essential (primary) hypertension: Secondary | ICD-10-CM

## 2017-08-27 MED ORDER — AMLODIPINE BESYLATE 5 MG PO TABS: 10.0000 mg | ORAL_TABLET | Freq: Every day | ORAL | 0 refills | Status: DC

## 2017-09-03 ENCOUNTER — Other Ambulatory Visit: Payer: Self-pay | Admitting: Family Medicine

## 2017-09-03 DIAGNOSIS — I25810 Atherosclerosis of coronary artery bypass graft(s) without angina pectoris: Secondary | ICD-10-CM

## 2017-09-03 DIAGNOSIS — I7025 Atherosclerosis of native arteries of other extremities with ulceration: Secondary | ICD-10-CM

## 2017-09-03 DIAGNOSIS — I739 Peripheral vascular disease, unspecified: Secondary | ICD-10-CM

## 2017-09-03 DIAGNOSIS — I10 Essential (primary) hypertension: Secondary | ICD-10-CM

## 2017-11-17 ENCOUNTER — Other Ambulatory Visit: Payer: Self-pay | Admitting: Family Medicine

## 2017-11-17 DIAGNOSIS — J4520 Mild intermittent asthma, uncomplicated: Secondary | ICD-10-CM

## 2017-12-18 ENCOUNTER — Other Ambulatory Visit: Payer: Self-pay

## 2017-12-18 ENCOUNTER — Ambulatory Visit (INDEPENDENT_AMBULATORY_CARE_PROVIDER_SITE_OTHER): Payer: Medicare Other | Admitting: Neurology

## 2017-12-18 ENCOUNTER — Encounter: Payer: Self-pay | Admitting: Neurology

## 2017-12-18 VITALS — BP 178/66 | HR 53 | Ht 65.0 in | Wt 200.0 lb

## 2017-12-18 DIAGNOSIS — G546 Phantom limb syndrome with pain: Secondary | ICD-10-CM

## 2017-12-18 DIAGNOSIS — G40209 Localization-related (focal) (partial) symptomatic epilepsy and epileptic syndromes with complex partial seizures, not intractable, without status epilepticus: Secondary | ICD-10-CM | POA: Diagnosis not present

## 2017-12-18 DIAGNOSIS — R569 Unspecified convulsions: Secondary | ICD-10-CM

## 2017-12-18 MED ORDER — PREGABALIN 50 MG PO CAPS: ORAL_CAPSULE | ORAL | 3 refills | Status: DC

## 2017-12-18 MED ORDER — LEVETIRACETAM ER 750 MG PO TB24: 1500.0000 mg | ORAL_TABLET | Freq: Every day | ORAL | 3 refills | Status: DC

## 2017-12-18 NOTE — Patient Instructions (Addendum)
1. Continue Keppra XR 1500mg  daily 2. Continue Lyrica 50mg  twice a day 3. Refer to Pain Management for phantom limb pain 4. Follow-up in 6-61months, call for any changes   Seizure Precautions: 1. If medication has been prescribed for you to prevent seizures, take it exactly as directed.  Do not stop taking the medicine without talking to your doctor first, even if you have not had a seizure in a long time.   2. Avoid activities in which a seizure would cause danger to yourself or to others.  Don't operate dangerous machinery, swim alone, or climb in high or dangerous places, such as on ladders, roofs, or girders.  Do not drive unless your doctor says you may.  3. If you have any warning that you may have a seizure, lay down in a safe place where you can't hurt yourself.    4.  No driving for 6 months from last seizure, as per [facility].   Please refer to the following link on the [place] website for more information: http://[email or web address]/[email or web address]/driving/drivingu.cfm   5.  Maintain good sleep hygiene. Avoid alcohol.  6.  Contact your doctor if you have any problems that may be related to the medicine you are taking.  7.  Call 911 and bring the patient back to the ED if:        A.  The seizure lasts longer than 5 minutes.       B.  The patient doesn't awaken shortly after the seizure  C.  The patient has new problems such as difficulty seeing, speaking or moving  D.  The patient was injured during the seizure  E.  The patient has a temperature over 102 F (39C)  F.  The patient vomited and now is having trouble breathing

## 2017-12-18 NOTE — Progress Notes (Signed)
NEUROLOGY FOLLOW UP OFFICE NOTE  [name] [id number]  DOB: 04/18/1955  HISTORY OF PRESENT ILLNESS: I had the pleasure of seeing [name] in follow-up in the neurology clinic on 12/18/2017.  The patient was last seen 6 months ago for seizures. She is accompanied by her husband who helps supplement the history today. She had seizures in June 2015 in the setting of abnormal brain MRI. Repeat MRI 2 months after showed complete resolution of abnormal signal. Radiology noted that findings could have been due to cerebritis or conceivably due to venous thrombosis without infarction. I wonder about PRES. She has been taking Keppra XR 1500mg  daily with no further seizures or seizure-like symptoms since June 2015. On her last visit, she reported increase in phantom limb pain, Lyrica increased to 50mg  BID. She states that she has been having more pain recently, sometimes taking 3 capsules daily. She has noticed weight gain, difficulty completing sentences and finding words. She reports pain all the way down to her feet, she is s/p bilateral BKA. She continues to have elevated BP and works with Dr. Harrington Challenger adjusting BP medication. Her husband denies any staring/unresponsive/confusional episodes. She denies any headaches, dizziness, diplopia, focal numbness/tingling/weakness.   HPI: This is a 62 yo RH woman with a history of diabetes, hypertension, hyperlipidemia, peripheral vascular disease s/p right carotid endarterectomy, CAD s/p CABG, and right BKA secondary to diabetes. She was admitted for left foot osteomyelitis due to poor healing wound after a fall last winter. She underwent left transtibial amputation on 08/22/13. She was discharged home on 06/11, then started having altered mental status on 06/15. Her son reports she had hit her head in the left frontal region when getting into their car, then started acting drowsy and out of it the next day. That evening, they were getting ready to eat when her  right arm flexed and clenched into her chest followed by head turn to the left,stiffening and shaking for 1 minute as she fell to her left side. She was confused for 10 minutes, back to baseline within 30 minutes. She was brought to [facility] ER where BP was noted to be 205/87. She was given IV labetalol with slow improvement in BP. Blood sugar noted to be 303. Head CT showed low attenuation in the left corona radiata and centrum semiovale. She had an MRI brain initially without contrast which showed increased T2 and FLAIR signal in the left hemisphere from the anterior frontal cortex, supplemental motor area, extending down to the posterior limb of the internal capsule. She became agitated and needed sedation for MRI brain with contrast, however again became restless with motion degradation, no abnormal enhancement seen. Left ICA flow void was noted to be absent, similar since 2009. It was felt that symptoms were not consistent with left MCA ischemia and concern was for viral CNS infection and received 4 days of empiric IV Acyclovir. She underwent a lumbar puncture which showed WBC 1, RBC 17, glucose 82, protein 29, VDRL, CMV, HSV, arbovirus, gram stain and culture negative. No malignant cells on cytology. She was started on Keppra with no further seizures, discharged home on 06/18. She was apparently intermittently confused at home, woke up better then next day, then had another seizure that morning. Her son today reports she had a generalized convulsion with head deviation to left for 5 minutes. On arrival to the ER, per records, she was poorly responsive with right gaze deviation, nystagmoid eye movements. BP 197/80 She was given IV Ativan,  according to her son she remained in this state for an hour, then was unresponsive for 36 hours after. Prolonged EEG showed diffuse slowing, no electrographic seizures or epileptiform discharges. Repeat MRI brain with and without contrast was again degraded by motion. Similar  abnormal signal in the left frontal and parietal lobes again noted, predominantly in the centrum semiovale extending into the subcortical white matter. There is an area of questionable thickening in the left frontal cortex noted to be unchanged, no effacement of sulci in this area, no restricted diffusion, no abnormal enhancement although study is suboptimal. Chronically occluded left ICA again noted. She was discharged home on Keppra XR 1500mg /day. Repeat MRI 2 months later showed complete resolution of abnormal signal in the left frontal and parietal regions. There was mild chronic small vessel disease bilaterally. Per report, findings could have been due to cerebritis or conceivably due to venous thrombosis without infarction.   Of note, in 2009, she had a nocturnal convulsion and was admitted to [facility]. Records unavailable for review, according to her son she was not started on seizure medication due to first seizure. She had an MRI brain at that time which I personally reviewed, it was noted to be limited by motion and patient refusing to complete study, however on FLAIR sequences, white matter changes in the left hemisphere are not present.   Epilepsy Risk Factors: Left frontal white matter lesion that has resolved. Otherwise she had a normal birth and early development. There is no history of febrile convulsions, significant traumatic brain injury, neurosurgical procedures, or family history of seizures.   PAST MEDICAL HISTORY: Past Medical History:  Diagnosis Date  . Anemia   . Anxiety   . Arthritis    "hands"  . CHF (congestive heart failure) ([place])   . Complication of anesthesia    pt felt like she had a hard time waking up after surgery in Feb. 2015.  Marland Kitchen Coronary artery disease   . Depression   . GERD (gastroesophageal reflux disease)    takes Zantac  . High cholesterol   . History of blood transfusion    "one time; related to diabetes I guess"   . Hypertension   . Neuropathy   .  Peripheral vascular disease ([place])   . Seizures ([place])   . Type II diabetes mellitus ([facility])     MEDICATIONS: Current Outpatient Medications on File Prior to Visit  Medication Sig Dispense Refill  . amLODipine (NORVASC) 5 MG tablet Take 2 tablets (10 mg total) by mouth daily. 180 tablet 0  . aspirin 81 MG tablet Take 1 tablet (81 mg total) by mouth daily. 90 tablet 0  . atorvastatin (LIPITOR) 80 MG tablet Take 1 tablet (80 mg total) by mouth daily. 90 tablet 0  . carvedilol (COREG) 25 MG tablet TAKE 1 TABLET BY MOUTH EVERY DAY 90 tablet 0  . clopidogrel (PLAVIX) 75 MG tablet Take 1 tablet (75 mg total) by mouth daily. 90 tablet 0  . diclofenac sodium (VOLTAREN) 1 % GEL Apply topically.    Marland Kitchen HYDROcodone-acetaminophen (NORCO/VICODIN) 5-325 MG tablet Take 1 tablet by mouth every 6 (six) hours as needed for moderate pain.    Marland Kitchen insulin NPH Human (HUMULIN N,NOVOLIN N) 100 UNIT/ML injection 30 Units in AM and 40U in PM 10 mL 3  . Levetiracetam 750 MG TB24 Take 2 tablets (1,500 mg total) by mouth daily. 180 tablet 3  . LORazepam (ATIVAN) 1 MG tablet Take 1 mg by mouth at bedtime as needed.  5  . Multiple Vitamins-Minerals (MULTIVITAMINS THER. W/MINERALS) TABS Take 1 tablet by mouth daily.      . pregabalin (LYRICA) 50 MG capsule Take 1 tablet twice a day 180 capsule 3  . PROAIR HFA 108 (90 Base) MCG/ACT inhaler INHALE 1-2 PUFFS INTO THE LUNGS EVERY 6 (SIX) HOURS AS NEEDED FOR WHEEZING OR SHORTNESS OF BREATH. 8.5 Inhaler 0  . quinapril (ACCUPRIL) 40 MG tablet Take 1 tablet (40 mg total) by mouth daily. 90 tablet 0  . ranitidine (ZANTAC) 150 MG tablet TAKE 1 TABLET BY MOUTH TWICE A DAY 180 tablet 0  . sertraline (ZOLOFT) 100 MG tablet Take 1 tablet (100 mg total) by mouth 2 (two) times daily. 180 tablet 0   No current facility-administered medications on file prior to visit.     ALLERGIES: Allergies  Allergen Reactions  . Tape Rash    Redness, rash, itchiness    FAMILY HISTORY: Family History    Problem Relation Age of Onset  . Cancer Mother   . Heart disease Father   . Diabetes Father   . Hyperlipidemia Father   . Hypertension Father     SOCIAL HISTORY: Social History   Socioeconomic History  . Marital status: Married    Spouse name: Not on file  . Number of children: Not on file  . Years of education: Not on file  . Highest education level: Not on file  Occupational History  . Not on file  Social Needs  . Financial resource strain: Not on file  . Food insecurity:    Worry: Not on file    Inability: Not on file  . Transportation needs:    Medical: Not on file    Non-medical: Not on file  Tobacco Use  . Smoking status: Never Smoker  . Smokeless tobacco: Never Used  Substance and Sexual Activity  . Alcohol use: No  . Drug use: No  . Sexual activity: Not Currently  Lifestyle  . Physical activity:    Days per week: Not on file    Minutes per session: Not on file  . Stress: Not on file  Relationships  . Social connections:    Talks on phone: Not on file    Gets together: Not on file    Attends religious service: Not on file    Active member of club or organization: Not on file    Attends meetings of clubs or organizations: Not on file    Relationship status: Not on file  . Intimate partner violence:    Fear of current or ex partner: Not on file    Emotionally abused: Not on file    Physically abused: Not on file    Forced sexual activity: Not on file  Other Topics Concern  . Not on file  Social History Narrative  . Not on file    REVIEW OF SYSTEMS: Constitutional: No fevers, chills, or sweats, no generalized fatigue, change in appetite Eyes: s/p left cataract surgery this morning Ear, nose and throat: No hearing loss, ear pain, nasal congestion, sore throat Cardiovascular: No chest pain, palpitations Respiratory:  No shortness of breath at rest or with exertion, wheezes GastrointestinaI: No nausea, vomiting, diarrhea, abdominal pain, fecal  incontinence Genitourinary:  No dysuria, urinary retention or frequency Musculoskeletal:  No neck pain, back pain Integumentary: No rash, pruritus, skin lesions Neurological: as above Psychiatric: No depression, insomnia, anxiety Endocrine: No palpitations, fatigue, diaphoresis, mood swings, change in appetite, change in weight, increased thirst Hematologic/Lymphatic:  No  anemia, purpura, petechiae. Allergic/Immunologic: no itchy/runny eyes, nasal congestion, recent allergic reactions, rashes  PHYSICAL EXAM: Vitals:   12/18/17 1556  BP: (!) 178/66  Pulse: (!) 53  SpO2: 98%   General: No acute distress, sitting on wheelchair Head: Normocephalic/atraumatic Neck: supple, no paraspinal tenderness, full range of motion Heart: Regular rate and rhythm Lungs: Clear to auscultation bilaterally Back: No paraspinal tenderness Skin/Extremities: No rash, no edema, s/p BKA bilaterally Neurological Exam:alert and oriented to person, place, time. No dysarthria or aphasia, Fund of knowledge is appropriate. Remote and recent recall intact.Attention and concentration are normal. Able to name objects and repeat phrases. Cranial nerves:  CN I: not tested  CN II: PERRLA, visual fields intact CN III, IV, VI: full range of motion, no nystagmus, no ptosis  CN V: facial sensation intact  CN VII: no facial asymmetry CN VIII: hearing intact to finger rub  CN IX, X: gag intact, uvula midline  CN XI: sternocleidomastoid and trapezius muscles intact  CN XII: tongue midline  Bulk & Tone: normal, no fasciculations.  Motor: 5/5 on both UE and both proximal LE, s/p bilateral BKA Sensation: intact to light touch. No extinction to double simultaneous stimulation.  Deep Tendon Reflexes: +2 on both UE, unable to elicit in both knees  Plantar responses: not tested  Cerebellar: no incoordination on finger to nose testing  Gait: not tested, sitting on wheelchair Tremor: none   IMPRESSION:  This  is a 62 yo RH woman with a history of diabetes, hypertension, hyperlipidemia, peripheral vascular disease s/p right carotid endarterectomy, CAD s/p CABG, s/p bilateral BKA, nocturnal convulsion in 2009. She underwent left transtibial amputation on 08/22/13 then had a seizure on 08/25/13. MRI brain showed a lesion in the left frontal and parietal white matter, concerning for vasogenic edema. LP unremarkable, including cytology. Repeat MRI brain 2 months later showed complete resolution of abnormal signal. Considerations include cerebritis, venous thrombosis without infarction. She was noted to have hypertensive emergency with the first seizure, although asymmetric and more frontal, atypical PRES is another consideration, particularly with complete resolution on MRI. She continues to do well with no seizures since June 2015. Continue to monitor BP with Dr. Harrington Challenger, especially with prior history as noted above. She is reporting increased phantom limb pain but does not want to increase Lyrica further, side effects of weight gain and word-finding difficulties. Refills for Lyrica 50mg  BID, referral to Pain Management will be sent today. Follow-up in 6-8 months, she knows to call for any changes.   Thank you for allowing me to participate in her care.  Please do not hesitate to call for any questions or concerns.   Ellouise Newer, M.D.   CC: Dr. Ethelene Hal

## 2017-12-20 MED ORDER — PREGABALIN 50 MG PO CAPS: ORAL_CAPSULE | ORAL | 3 refills | Status: DC

## 2018-03-13 ENCOUNTER — Other Ambulatory Visit: Payer: Self-pay | Admitting: Family Medicine

## 2018-03-13 DIAGNOSIS — I25810 Atherosclerosis of coronary artery bypass graft(s) without angina pectoris: Secondary | ICD-10-CM

## 2018-03-13 DIAGNOSIS — I7025 Atherosclerosis of native arteries of other extremities with ulceration: Secondary | ICD-10-CM

## 2018-03-13 DIAGNOSIS — I739 Peripheral vascular disease, unspecified: Secondary | ICD-10-CM

## 2018-03-29 DIAGNOSIS — I509 Heart failure, unspecified: Secondary | ICD-10-CM

## 2018-03-29 DIAGNOSIS — Z951 Presence of aortocoronary bypass graft: Secondary | ICD-10-CM

## 2018-03-29 DIAGNOSIS — R0609 Other forms of dyspnea: Secondary | ICD-10-CM | POA: Insufficient documentation

## 2018-03-29 DIAGNOSIS — Z89511 Acquired absence of right leg below knee: Secondary | ICD-10-CM | POA: Insufficient documentation

## 2018-03-29 DIAGNOSIS — R0600 Dyspnea, unspecified: Secondary | ICD-10-CM

## 2018-03-29 HISTORY — DX: Heart failure, unspecified: I50.9

## 2018-03-29 HISTORY — DX: Dyspnea, unspecified: R06.00

## 2018-03-29 HISTORY — DX: Acquired absence of right leg below knee: Z89.511

## 2018-03-29 HISTORY — DX: Other forms of dyspnea: R06.09

## 2018-03-29 HISTORY — DX: Presence of aortocoronary bypass graft: Z95.1

## 2018-05-26 ENCOUNTER — Other Ambulatory Visit: Payer: Self-pay | Admitting: Neurology

## 2018-05-26 DIAGNOSIS — G40209 Localization-related (focal) (partial) symptomatic epilepsy and epileptic syndromes with complex partial seizures, not intractable, without status epilepticus: Secondary | ICD-10-CM

## 2018-07-02 ENCOUNTER — Telehealth: Payer: Self-pay | Admitting: General Practice

## 2018-07-02 NOTE — Telephone Encounter (Signed)
Called patient and she does not want to see Dr. Ethelene Hal. She is seeing a new doctor

## 2018-08-11 ENCOUNTER — Telehealth (INDEPENDENT_AMBULATORY_CARE_PROVIDER_SITE_OTHER): Payer: Medicare Other | Admitting: Neurology

## 2018-08-11 ENCOUNTER — Ambulatory Visit: Payer: Medicare Other | Admitting: Neurology

## 2018-08-11 ENCOUNTER — Encounter

## 2018-08-11 ENCOUNTER — Other Ambulatory Visit: Payer: Self-pay

## 2018-08-11 DIAGNOSIS — G546 Phantom limb syndrome with pain: Secondary | ICD-10-CM | POA: Diagnosis not present

## 2018-08-11 DIAGNOSIS — G40209 Localization-related (focal) (partial) symptomatic epilepsy and epileptic syndromes with complex partial seizures, not intractable, without status epilepticus: Secondary | ICD-10-CM

## 2018-08-11 MED ORDER — LEVETIRACETAM ER 750 MG PO TB24: 1500.0000 mg | ORAL_TABLET | Freq: Every day | ORAL | 3 refills | Status: DC

## 2018-08-11 MED ORDER — PREGABALIN 50 MG PO CAPS: ORAL_CAPSULE | ORAL | 3 refills | Status: DC

## 2018-08-11 NOTE — Progress Notes (Signed)
Virtual Visit via Video Note The purpose of this virtual visit is to provide medical care while limiting exposure to the novel coronavirus.    Consent was obtained for video visit:  Yes.   Answered questions that patient had about telehealth interaction:  Yes.   I discussed the limitations, risks, security and privacy concerns of performing an evaluation and management service by telemedicine. I also discussed with the patient that there may be a patient responsible charge related to this service. The patient expressed understanding and agreed to proceed.  Pt location: Home Physician Location: office Name of referring provider:  No ref. provider found I connected with [name] at patients initiation/request on 08/11/2018 at  3:00 PM EDT by video enabled telemedicine application and verified that I am speaking with the correct person using two identifiers. Pt MRN:  [id number] Pt DOB:  [birth year] Video Participants:  [name]   History of Present Illness:  The patient was last seen in October 2019 for seizures and phantom limb pain. Since her last visit, she continues to deny any seizures since June 2015 on Keppra XR 1500mg  daily without side effects. Her main concern has been the phantom limb pain, she has bilateral BKA and reports Lyrica 50mg  BID has been controlling the pain. She did fall last Mother's Day when her right leg gave way. She feels pain where her left foot would be. She finds it very hard to move in the morning, mostly staying in bed. She is gradually trying to mobilize more.  She denies any staring/unresponsive episodes, confusion, headaches, dizziness, vision changes.   HPI: This is a 63 yo RH woman with a history of diabetes, hypertension, hyperlipidemia, peripheral vascular disease s/p right carotid endarterectomy, CAD s/p CABG, and right BKA secondary to diabetes. She was admitted for left foot osteomyelitis due to poor healing wound after a fall last winter.  She underwent left transtibial amputation on 08/22/13. She was discharged home on 06/11, then started having altered mental status on 06/15. Her son reports she had hit her head in the left frontal region when getting into their car, then started acting drowsy and out of it the next day. That evening, they were getting ready to eat when her right arm flexed and clenched into her chest followed by head turn to the left,stiffening and shaking for 1 minute as she fell to her left side. She was confused for 10 minutes, back to baseline within 30 minutes. She was brought to [facility] ER where BP was noted to be 205/87. She was given IV labetalol with slow improvement in BP. Blood sugar noted to be 303. Head CT showed low attenuation in the left corona radiata and centrum semiovale. She had an MRI brain initially without contrast which showed increased T2 and FLAIR signal in the left hemisphere from the anterior frontal cortex, supplemental motor area, extending down to the posterior limb of the internal capsule. She became agitated and needed sedation for MRI brain with contrast, however again became restless with motion degradation, no abnormal enhancement seen. Left ICA flow void was noted to be absent, similar since 2009. It was felt that symptoms were not consistent with left MCA ischemia and concern was for viral CNS infection and received 4 days of empiric IV Acyclovir. She underwent a lumbar puncture which showed WBC 1, RBC 17, glucose 82, protein 29, VDRL, CMV, HSV, arbovirus, gram stain and culture negative. No malignant cells on cytology. She was started on Keppra with  no further seizures, discharged home on 06/18. She was apparently intermittently confused at home, woke up better then next day, then had another seizure that morning. Her son today reports she had a generalized convulsion with head deviation to left for 5 minutes. On arrival to the ER, per records, she was poorly responsive with right gaze deviation,  nystagmoid eye movements. BP 197/80 She was given IV Ativan, according to her son she remained in this state for an hour, then was unresponsive for 36 hours after. Prolonged EEG showed diffuse slowing, no electrographic seizures or epileptiform discharges. Repeat MRI brain with and without contrast was again degraded by motion. Similar abnormal signal in the left frontal and parietal lobes again noted, predominantly in the centrum semiovale extending into the subcortical white matter. There is an area of questionable thickening in the left frontal cortex noted to be unchanged, no effacement of sulci in this area, no restricted diffusion, no abnormal enhancement although study is suboptimal. Chronically occluded left ICA again noted. She was discharged home on Keppra XR 1500mg /day. Repeat MRI 2 months later showed complete resolution of abnormal signal in the left frontal and parietal regions. There was mild chronic small vessel disease bilaterally. Per report, findings could have been due to cerebritis or conceivably due to venous thrombosis without infarction.   Of note, in 2009, she had a nocturnal convulsion and was admitted to [facility]. Records unavailable for review, according to her son she was not started on seizure medication due to first seizure. She had an MRI brain at that time which I personally reviewed, it was noted to be limited by motion and patient refusing to complete study, however on FLAIR sequences, white matter changes in the left hemisphere are not present.   Epilepsy Risk Factors: Left frontal white matter lesion that has resolved. Otherwise she had a normal birth and early development. There is no history of febrile convulsions, significant traumatic brain injury, neurosurgical procedures, or family history of seizures.     Current Outpatient Medications on File Prior to Visit  Medication Sig Dispense Refill  . acetaminophen-codeine (TYLENOL #3) 300-30 MG tablet Take 1 tablet by mouth  every 4 (four) hours as needed.    Marland Kitchen albuterol (VENTOLIN HFA) 108 (90 Base) MCG/ACT inhaler INHALE 1-2 PUFFS INTO THE LUNGS EVERY 6 (SIX) HOURS AS NEEDED FOR WHEEZING OR SHORTNESS OF BREATH.    Marland Kitchen amLODipine (NORVASC) 5 MG tablet Take 2 tablets (10 mg total) by mouth daily. 180 tablet 0  . amLODipine (NORVASC) 5 MG tablet Take by mouth.    Marland Kitchen aspirin 81 MG tablet Take 1 tablet (81 mg total) by mouth daily. 90 tablet 0  . atorvastatin (LIPITOR) 80 MG tablet Take 1 tablet (80 mg total) by mouth daily. 90 tablet 0  . busPIRone (BUSPAR) 15 MG tablet TAKE 2 TABLETS BY MOUTH TWICE A DAY AS NEEDED FOR ANXIETY    . carvedilol (COREG) 25 MG tablet TAKE 1 TABLET BY MOUTH EVERY DAY (Patient taking differently: 12.5 mg 2 (two) times a day. ) 90 tablet 0  . clopidogrel (PLAVIX) 75 MG tablet Take 1 tablet (75 mg total) by mouth daily. 90 tablet 0  . furosemide (LASIX) 40 MG tablet Take by mouth.    . irbesartan (AVAPRO) 300 MG tablet Take 300 mg by mouth daily.    Marland Kitchen LANTUS SOLOSTAR 100 UNIT/ML Solostar Pen INJECT 40 UNITS OR AS DIRECTED ONCE A DAY SUBCUTANEOUS 30 DAYS    . lidocaine (LIDODERM) 5 % APPLY  1 PATCH REMOVE AFTER 12 HOURS ONCE A DAY AS NEEDED EXTERNALLY    . Multiple Vitamins-Minerals (MULTIVITAMINS THER. W/MINERALS) TABS Take 1 tablet by mouth daily.      Marland Kitchen olmesartan (BENICAR) 40 MG tablet Take 40 mg by mouth daily.    Marland Kitchen PROAIR HFA 108 (90 Base) MCG/ACT inhaler INHALE 1-2 PUFFS INTO THE LUNGS EVERY 6 (SIX) HOURS AS NEEDED FOR WHEEZING OR SHORTNESS OF BREATH. 8.5 Inhaler 0  . promethazine-dextromethorphan (PROMETHAZINE-DM) 6.25-15 MG/5ML syrup TAKE 5 ML BY MOUTH AS NEEDED FOR COUGH EVERY 6 TO 8 HRS    . quinapril (ACCUPRIL) 40 MG tablet Take 1 tablet (40 mg total) by mouth daily. 90 tablet 0  . sertraline (ZOLOFT) 100 MG tablet Take 1 tablet (100 mg total) by mouth 2 (two) times daily. 180 tablet 0   No current facility-administered medications on file prior to visit.      Observations/Objective:    Vitals:   08/11/18 1409  Weight: 180 lb (81.6 kg)  Height: 5\' 5"  (1.651 m)   GEN:  The patient appears stated age and is in NAD.  Neurological examination: Patient is awake, alert, oriented x 3. No aphasia or dysarthria. Intact fluency and comprehension. Remote and recent memory intact. Able to name and repeat. Cranial nerves: Extraocular movements intact with no nystagmus. No facial asymmetry. Motor: moves all extremities symmetrically, at least anti-gravity x 4. No incoordination on finger to nose testing. Gait: not tested, reporting back pain with movements  Assessment and Plan:   This is a 63 yo RH woman with a history of diabetes, hypertension, hyperlipidemia, peripheral vascular disease s/p right carotid endarterectomy, CAD s/p CABG, s/p bilateral BKA, nocturnal convulsion in 2009. She underwent left transtibial amputation on 08/22/13 then had a seizure on 08/25/13. MRI brain showed a lesion in the left frontal and parietal white matter, concerning for vasogenic edema. LP unremarkable, including cytology. Repeat MRI brain 2 months later showed complete resolution of abnormal signal. Considerations include cerebritis, venous thrombosis without infarction. She was noted to have hypertensive emergency with the first seizure, although asymmetric and more frontal, atypical PRES is another consideration, particularly with complete resolution on MRI. No further seizures since June 2015, continue Keppra XR 1500mg  daily. She has phantom limb pain with fair control on Lyrica 50mg  BID, she declined higher doses in the past due to side effects of weight gain and word-finding difficulties. She is reporting continued back pain from a fall a few weeks ago, follow-up with PCP. Follow-up in 8-12 months, she knows to call for any changes.    Follow Up Instructions:   -I discussed the assessment and treatment plan with the patient. The patient was provided an opportunity to ask questions and all were answered.  The patient agreed with the plan and demonstrated an understanding of the instructions.   The patient was advised to call back or seek an in-person evaluation if the symptoms worsen or if the condition fails to improve as anticipated.     Cameron Sprang, MD

## 2018-08-16 ENCOUNTER — Other Ambulatory Visit: Payer: Self-pay | Admitting: Family Medicine

## 2018-08-16 ENCOUNTER — Other Ambulatory Visit: Payer: Self-pay

## 2018-08-16 ENCOUNTER — Ambulatory Visit
Admission: RE | Admit: 2018-08-16 | Discharge: 2018-08-16 | Disposition: A | Discharge status: Home / Self Care | Payer: Medicare Other | Source: Ambulatory Visit | Attending: Family Medicine | Admitting: Family Medicine

## 2018-08-16 ENCOUNTER — Encounter (HOSPITAL_COMMUNITY): Payer: Self-pay

## 2018-08-16 ENCOUNTER — Emergency Department (HOSPITAL_COMMUNITY)
Admission: EM | Admit: 2018-08-16 | Discharge: 2018-08-16 | Disposition: A | Discharge status: Home / Self Care | Payer: Medicare Other | Source: Home / Self Care

## 2018-08-16 DIAGNOSIS — S72142A Displaced intertrochanteric fracture of left femur, initial encounter for closed fracture: Secondary | ICD-10-CM | POA: Diagnosis not present

## 2018-08-16 DIAGNOSIS — M25552 Pain in left hip: Secondary | ICD-10-CM | POA: Insufficient documentation

## 2018-08-16 DIAGNOSIS — Z5321 Procedure and treatment not carried out due to patient leaving prior to being seen by health care provider: Secondary | ICD-10-CM | POA: Insufficient documentation

## 2018-08-16 DIAGNOSIS — M79652 Pain in left thigh: Secondary | ICD-10-CM

## 2018-08-16 NOTE — ED Triage Notes (Signed)
Pt arrives POV for eval of L hip pain s/p fall on 5/10. Pt states that she had fallen in the bathroom on that date, today had imaging of L hip and was found to have a L intertrochanteric femur fx. L aka amp. Wheelchair use at baseline

## 2018-08-19 ENCOUNTER — Encounter (HOSPITAL_COMMUNITY): Payer: Self-pay | Admitting: Emergency Medicine

## 2018-08-19 ENCOUNTER — Emergency Department (HOSPITAL_COMMUNITY): Payer: Medicare Other

## 2018-08-19 ENCOUNTER — Inpatient Hospital Stay (HOSPITAL_COMMUNITY)
Admission: EM | Admit: 2018-08-19 | Discharge: 2018-08-22 | DRG: 481 | Disposition: A | Discharge status: Home Health | Payer: Medicare Other | Attending: Internal Medicine | Admitting: Internal Medicine

## 2018-08-19 ENCOUNTER — Inpatient Hospital Stay (HOSPITAL_COMMUNITY): Payer: Medicare Other

## 2018-08-19 ENCOUNTER — Other Ambulatory Visit: Payer: Self-pay

## 2018-08-19 DIAGNOSIS — F341 Dysthymic disorder: Secondary | ICD-10-CM | POA: Diagnosis present

## 2018-08-19 DIAGNOSIS — Z79899 Other long term (current) drug therapy: Secondary | ICD-10-CM

## 2018-08-19 DIAGNOSIS — I10 Essential (primary) hypertension: Secondary | ICD-10-CM | POA: Diagnosis not present

## 2018-08-19 DIAGNOSIS — Z993 Dependence on wheelchair: Secondary | ICD-10-CM

## 2018-08-19 DIAGNOSIS — Z7982 Long term (current) use of aspirin: Secondary | ICD-10-CM

## 2018-08-19 DIAGNOSIS — E1151 Type 2 diabetes mellitus with diabetic peripheral angiopathy without gangrene: Secondary | ICD-10-CM | POA: Diagnosis present

## 2018-08-19 DIAGNOSIS — F418 Other specified anxiety disorders: Secondary | ICD-10-CM | POA: Diagnosis present

## 2018-08-19 DIAGNOSIS — Z833 Family history of diabetes mellitus: Secondary | ICD-10-CM

## 2018-08-19 DIAGNOSIS — E1122 Type 2 diabetes mellitus with diabetic chronic kidney disease: Secondary | ICD-10-CM | POA: Diagnosis present

## 2018-08-19 DIAGNOSIS — I130 Hypertensive heart and chronic kidney disease with heart failure and stage 1 through stage 4 chronic kidney disease, or unspecified chronic kidney disease: Secondary | ICD-10-CM | POA: Diagnosis present

## 2018-08-19 DIAGNOSIS — Z89512 Acquired absence of left leg below knee: Secondary | ICD-10-CM | POA: Diagnosis not present

## 2018-08-19 DIAGNOSIS — E785 Hyperlipidemia, unspecified: Secondary | ICD-10-CM | POA: Diagnosis present

## 2018-08-19 DIAGNOSIS — S72142A Displaced intertrochanteric fracture of left femur, initial encounter for closed fracture: Secondary | ICD-10-CM | POA: Diagnosis present

## 2018-08-19 DIAGNOSIS — I2510 Atherosclerotic heart disease of native coronary artery without angina pectoris: Secondary | ICD-10-CM | POA: Diagnosis present

## 2018-08-19 DIAGNOSIS — M19041 Primary osteoarthritis, right hand: Secondary | ICD-10-CM | POA: Diagnosis present

## 2018-08-19 DIAGNOSIS — D638 Anemia in other chronic diseases classified elsewhere: Secondary | ICD-10-CM | POA: Diagnosis present

## 2018-08-19 DIAGNOSIS — K219 Gastro-esophageal reflux disease without esophagitis: Secondary | ICD-10-CM | POA: Diagnosis present

## 2018-08-19 DIAGNOSIS — E1141 Type 2 diabetes mellitus with diabetic mononeuropathy: Secondary | ICD-10-CM

## 2018-08-19 DIAGNOSIS — I509 Heart failure, unspecified: Secondary | ICD-10-CM | POA: Diagnosis present

## 2018-08-19 DIAGNOSIS — Z419 Encounter for procedure for purposes other than remedying health state, unspecified: Secondary | ICD-10-CM

## 2018-08-19 DIAGNOSIS — D649 Anemia, unspecified: Secondary | ICD-10-CM | POA: Diagnosis present

## 2018-08-19 DIAGNOSIS — Z91048 Other nonmedicinal substance allergy status: Secondary | ICD-10-CM

## 2018-08-19 DIAGNOSIS — M19042 Primary osteoarthritis, left hand: Secondary | ICD-10-CM | POA: Diagnosis present

## 2018-08-19 DIAGNOSIS — I7025 Atherosclerosis of native arteries of other extremities with ulceration: Secondary | ICD-10-CM

## 2018-08-19 DIAGNOSIS — Z951 Presence of aortocoronary bypass graft: Secondary | ICD-10-CM | POA: Diagnosis not present

## 2018-08-19 DIAGNOSIS — W010XXA Fall on same level from slipping, tripping and stumbling without subsequent striking against object, initial encounter: Secondary | ICD-10-CM | POA: Diagnosis present

## 2018-08-19 DIAGNOSIS — Z8349 Family history of other endocrine, nutritional and metabolic diseases: Secondary | ICD-10-CM

## 2018-08-19 DIAGNOSIS — N183 Chronic kidney disease, stage 3 (moderate): Secondary | ICD-10-CM | POA: Diagnosis present

## 2018-08-19 DIAGNOSIS — G546 Phantom limb syndrome with pain: Secondary | ICD-10-CM | POA: Diagnosis present

## 2018-08-19 DIAGNOSIS — E1169 Type 2 diabetes mellitus with other specified complication: Secondary | ICD-10-CM | POA: Diagnosis present

## 2018-08-19 DIAGNOSIS — F329 Major depressive disorder, single episode, unspecified: Secondary | ICD-10-CM | POA: Diagnosis present

## 2018-08-19 DIAGNOSIS — I739 Peripheral vascular disease, unspecified: Secondary | ICD-10-CM

## 2018-08-19 DIAGNOSIS — Z89511 Acquired absence of right leg below knee: Secondary | ICD-10-CM | POA: Diagnosis not present

## 2018-08-19 DIAGNOSIS — E7800 Pure hypercholesterolemia, unspecified: Secondary | ICD-10-CM | POA: Diagnosis present

## 2018-08-19 DIAGNOSIS — Z8249 Family history of ischemic heart disease and other diseases of the circulatory system: Secondary | ICD-10-CM

## 2018-08-19 DIAGNOSIS — I080 Rheumatic disorders of both mitral and aortic valves: Secondary | ICD-10-CM | POA: Diagnosis present

## 2018-08-19 DIAGNOSIS — G40209 Localization-related (focal) (partial) symptomatic epilepsy and epileptic syndromes with complex partial seizures, not intractable, without status epilepticus: Secondary | ICD-10-CM

## 2018-08-19 DIAGNOSIS — R569 Unspecified convulsions: Secondary | ICD-10-CM | POA: Diagnosis present

## 2018-08-19 DIAGNOSIS — Z794 Long term (current) use of insulin: Secondary | ICD-10-CM

## 2018-08-19 DIAGNOSIS — E1165 Type 2 diabetes mellitus with hyperglycemia: Secondary | ICD-10-CM | POA: Diagnosis present

## 2018-08-19 DIAGNOSIS — T1490XA Injury, unspecified, initial encounter: Secondary | ICD-10-CM

## 2018-08-19 DIAGNOSIS — W19XXXA Unspecified fall, initial encounter: Secondary | ICD-10-CM

## 2018-08-19 DIAGNOSIS — Z1159 Encounter for screening for other viral diseases: Secondary | ICD-10-CM | POA: Diagnosis not present

## 2018-08-19 DIAGNOSIS — I25810 Atherosclerosis of coronary artery bypass graft(s) without angina pectoris: Secondary | ICD-10-CM

## 2018-08-19 LAB — BASIC METABOLIC PANEL
Anion gap: 14 (ref 5–15)
BUN: 30 mg/dL — ABNORMAL HIGH (ref 8–23)
CO2: 29 mmol/L (ref 22–32)
Calcium: 9.3 mg/dL (ref 8.9–10.3)
Chloride: 91 mmol/L — ABNORMAL LOW (ref 98–111)
Creatinine, Ser: 1.49 mg/dL — ABNORMAL HIGH (ref 0.44–1.00)
GFR calc Af Amer: 43 mL/min — ABNORMAL LOW (ref >60)
GFR calc non Af Amer: 38 mL/min — ABNORMAL LOW (ref >60)
Glucose, Bld: 378 mg/dL — ABNORMAL HIGH (ref 70–99)
Potassium: 3.9 mmol/L (ref 3.5–5.1)
Sodium: 134 mmol/L — ABNORMAL LOW (ref 135–145)

## 2018-08-19 LAB — CBC WITH DIFFERENTIAL/PLATELET
Abs Immature Granulocytes: 0.02 10*3/uL (ref 0.00–0.07)
Basophils Absolute: 0 10*3/uL (ref 0.0–0.1)
Basophils Relative: 0 %
Eosinophils Absolute: 0.2 10*3/uL (ref 0.0–0.5)
Eosinophils Relative: 4 %
HCT: 28.7 % — ABNORMAL LOW (ref 36.0–46.0)
Hemoglobin: 9.2 g/dL — ABNORMAL LOW (ref 12.0–15.0)
Immature Granulocytes: 0 %
Lymphocytes Relative: 15 %
Lymphs Abs: 0.8 10*3/uL (ref 0.7–4.0)
MCH: 28.2 pg (ref 26.0–34.0)
MCHC: 32.1 g/dL (ref 30.0–36.0)
MCV: 88 fL (ref 80.0–100.0)
Monocytes Absolute: 0.4 10*3/uL (ref 0.1–1.0)
Monocytes Relative: 7 %
Neutro Abs: 3.9 10*3/uL (ref 1.7–7.7)
Neutrophils Relative %: 74 %
Platelets: 221 10*3/uL (ref 150–400)
RBC: 3.26 MIL/uL — ABNORMAL LOW (ref 3.87–5.11)
RDW: 16 % — ABNORMAL HIGH (ref 11.5–15.5)
WBC: 5.3 10*3/uL (ref 4.0–10.5)
nRBC: 0 % (ref 0.0–0.2)

## 2018-08-19 LAB — HEMOGLOBIN A1C
Hgb A1c MFr Bld: 8.5 % — ABNORMAL HIGH (ref 4.8–5.6)
Mean Plasma Glucose: 197.25 mg/dL

## 2018-08-19 LAB — SARS CORONAVIRUS 2 BY RT PCR (HOSPITAL ORDER, PERFORMED IN ~~LOC~~ HOSPITAL LAB): SARS Coronavirus 2: NEGATIVE

## 2018-08-19 LAB — PROTIME-INR
INR: 1.2 (ref 0.8–1.2)
Prothrombin Time: 14.6 seconds (ref 11.4–15.2)

## 2018-08-19 LAB — SURGICAL PCR SCREEN
MRSA, PCR: NEGATIVE
Staphylococcus aureus: POSITIVE — AB

## 2018-08-19 LAB — GLUCOSE, CAPILLARY
Glucose-Capillary: 295 mg/dL — ABNORMAL HIGH (ref 70–99)
Glucose-Capillary: 225 mg/dL — ABNORMAL HIGH (ref 70–99)

## 2018-08-19 MED ORDER — IRBESARTAN 300 MG PO TABS
300.0000 mg | ORAL_TABLET | Freq: Every day | ORAL | Status: DC
  Administered 2018-08-19 – 2018-08-22 (×3): 300 mg via ORAL
  Filled 2018-08-19 (×3): qty 1

## 2018-08-19 MED ORDER — LEVETIRACETAM ER 500 MG PO TB24
1500.0000 mg | ORAL_TABLET | Freq: Every day | ORAL | Status: DC
  Administered 2018-08-19 – 2018-08-21 (×3): 1500 mg via ORAL
  Filled 2018-08-19 (×4): qty 3

## 2018-08-19 MED ORDER — AMLODIPINE BESYLATE 5 MG PO TABS
5.0000 mg | ORAL_TABLET | Freq: Two times a day (BID) | ORAL | Status: DC
  Administered 2018-08-19 – 2018-08-21 (×3): 5 mg via ORAL
  Filled 2018-08-19 (×3): qty 1

## 2018-08-19 MED ORDER — GLUCERNA SHAKE PO LIQD
237.0000 mL | Freq: Two times a day (BID) | ORAL | Status: DC
  Administered 2018-08-21 – 2018-08-22 (×3): 237 mL via ORAL

## 2018-08-19 MED ORDER — ACETAMINOPHEN-CODEINE #3 300-30 MG PO TABS
1.0000 | ORAL_TABLET | ORAL | Status: DC | PRN
  Administered 2018-08-19 – 2018-08-22 (×5): 1 via ORAL
  Filled 2018-08-19 (×5): qty 1

## 2018-08-19 MED ORDER — ZOLPIDEM TARTRATE 5 MG PO TABS: 5.0000 mg | ORAL_TABLET | Freq: Every evening | ORAL | Status: DC | PRN

## 2018-08-19 MED ORDER — LIDOCAINE 5 % EX PTCH
1.0000 | MEDICATED_PATCH | CUTANEOUS | Status: DC
  Administered 2018-08-19 – 2018-08-21 (×3): 1 via TRANSDERMAL
  Filled 2018-08-19 (×4): qty 1

## 2018-08-19 MED ORDER — INSULIN ASPART 100 UNIT/ML ~~LOC~~ SOLN
3.0000 [IU] | Freq: Three times a day (TID) | SUBCUTANEOUS | Status: DC
  Administered 2018-08-20 – 2018-08-21 (×4): 3 [IU] via SUBCUTANEOUS

## 2018-08-19 MED ORDER — CARVEDILOL 25 MG PO TABS
25.0000 mg | ORAL_TABLET | Freq: Every day | ORAL | Status: DC
  Administered 2018-08-20 – 2018-08-21 (×2): 25 mg via ORAL
  Filled 2018-08-19: qty 1
  Filled 2018-08-19: qty 2
  Filled 2018-08-19: qty 1

## 2018-08-19 MED ORDER — POVIDONE-IODINE 10 % EX SWAB: 2.0000 "application " | Freq: Once | CUTANEOUS | Status: DC

## 2018-08-19 MED ORDER — FUROSEMIDE 40 MG PO TABS
40.0000 mg | ORAL_TABLET | Freq: Every day | ORAL | Status: DC
  Administered 2018-08-21: 40 mg via ORAL
  Filled 2018-08-19: qty 1

## 2018-08-19 MED ORDER — ASPIRIN EC 81 MG PO TBEC: 81.0000 mg | DELAYED_RELEASE_TABLET | Freq: Every day | ORAL | Status: DC

## 2018-08-19 MED ORDER — INSULIN ASPART 100 UNIT/ML ~~LOC~~ SOLN
0.0000 [IU] | Freq: Three times a day (TID) | SUBCUTANEOUS | Status: DC
  Administered 2018-08-19 – 2018-08-20 (×3): 8 [IU] via SUBCUTANEOUS
  Administered 2018-08-21 (×2): 11 [IU] via SUBCUTANEOUS
  Administered 2018-08-21 – 2018-08-22 (×2): 3 [IU] via SUBCUTANEOUS

## 2018-08-19 MED ORDER — SERTRALINE HCL 100 MG PO TABS
100.0000 mg | ORAL_TABLET | Freq: Two times a day (BID) | ORAL | Status: DC
  Administered 2018-08-19 – 2018-08-22 (×5): 100 mg via ORAL
  Filled 2018-08-19 (×5): qty 1

## 2018-08-19 MED ORDER — METHOCARBAMOL 1000 MG/10ML IJ SOLN
500.0000 mg | Freq: Four times a day (QID) | INTRAVENOUS | Status: DC | PRN
  Filled 2018-08-19: qty 5

## 2018-08-19 MED ORDER — METHOCARBAMOL 500 MG PO TABS
500.0000 mg | ORAL_TABLET | Freq: Four times a day (QID) | ORAL | Status: DC | PRN
  Administered 2018-08-19: 17:00:00 500 mg via ORAL
  Filled 2018-08-19: qty 1

## 2018-08-19 MED ORDER — ENOXAPARIN SODIUM 40 MG/0.4ML ~~LOC~~ SOLN: 40.0000 mg | SUBCUTANEOUS | Status: DC

## 2018-08-19 MED ORDER — CEFAZOLIN SODIUM-DEXTROSE 2-4 GM/100ML-% IV SOLN: 2.0000 g | INTRAVENOUS | Status: DC

## 2018-08-19 MED ORDER — INSULIN ASPART 100 UNIT/ML ~~LOC~~ SOLN
0.0000 [IU] | Freq: Every day | SUBCUTANEOUS | Status: DC
  Administered 2018-08-19 – 2018-08-21 (×3): 2 [IU] via SUBCUTANEOUS

## 2018-08-19 MED ORDER — CHLORHEXIDINE GLUCONATE 4 % EX LIQD
60.0000 mL | Freq: Once | CUTANEOUS | Status: DC
  Filled 2018-08-19 (×2): qty 60

## 2018-08-19 MED ORDER — MUPIROCIN 2 % EX OINT
1.0000 "application " | TOPICAL_OINTMENT | Freq: Two times a day (BID) | CUTANEOUS | Status: DC
  Administered 2018-08-19 – 2018-08-22 (×5): 1 via NASAL
  Filled 2018-08-19: qty 22

## 2018-08-19 MED ORDER — MORPHINE SULFATE (PF) 2 MG/ML IV SOLN: 0.5000 mg | INTRAVENOUS | Status: DC | PRN

## 2018-08-19 MED ORDER — INSULIN GLARGINE 100 UNIT/ML ~~LOC~~ SOLN
70.0000 [IU] | Freq: Every day | SUBCUTANEOUS | Status: DC
  Filled 2018-08-19: qty 0.7

## 2018-08-19 MED ORDER — ACETAMINOPHEN-CODEINE #3 300-30 MG PO TABS: 1.0000 | ORAL_TABLET | ORAL | Status: DC | PRN

## 2018-08-19 MED ORDER — POLYETHYLENE GLYCOL 3350 17 G PO PACK: 17.0000 g | PACK | Freq: Every day | ORAL | Status: DC | PRN

## 2018-08-19 MED ORDER — SODIUM CHLORIDE 0.9 % IV BOLUS
500.0000 mL | Freq: Once | INTRAVENOUS | Status: AC
  Administered 2018-08-19: 14:00:00 500 mL via INTRAVENOUS

## 2018-08-19 MED ORDER — INSULIN GLARGINE 100 UNIT/ML ~~LOC~~ SOLN
70.0000 [IU] | Freq: Every day | SUBCUTANEOUS | Status: DC
  Administered 2018-08-21: 09:00:00 70 [IU] via SUBCUTANEOUS
  Filled 2018-08-19 (×3): qty 0.7

## 2018-08-19 MED ORDER — ALBUTEROL SULFATE (2.5 MG/3ML) 0.083% IN NEBU: 3.0000 mL | INHALATION_SOLUTION | Freq: Four times a day (QID) | RESPIRATORY_TRACT | Status: DC | PRN

## 2018-08-19 MED ORDER — ENSURE PRE-SURGERY PO LIQD
296.0000 mL | Freq: Once | ORAL | Status: DC
  Filled 2018-08-19: qty 296

## 2018-08-19 MED ORDER — CHLORHEXIDINE GLUCONATE CLOTH 2 % EX PADS
6.0000 | MEDICATED_PAD | Freq: Every day | CUTANEOUS | Status: DC
  Administered 2018-08-19 – 2018-08-22 (×3): 6 via TOPICAL

## 2018-08-19 MED ORDER — BUSPIRONE HCL 10 MG PO TABS: 30.0000 mg | ORAL_TABLET | Freq: Two times a day (BID) | ORAL | Status: DC | PRN

## 2018-08-19 MED ORDER — HYDROCODONE-ACETAMINOPHEN 5-325 MG PO TABS
1.0000 | ORAL_TABLET | Freq: Four times a day (QID) | ORAL | Status: DC | PRN
  Administered 2018-08-19: 17:00:00 1 via ORAL
  Filled 2018-08-19: qty 1

## 2018-08-19 MED ORDER — ATORVASTATIN CALCIUM 80 MG PO TABS
80.0000 mg | ORAL_TABLET | Freq: Every day | ORAL | Status: DC
  Administered 2018-08-19 – 2018-08-21 (×3): 80 mg via ORAL
  Filled 2018-08-19 (×3): qty 1

## 2018-08-19 MED ORDER — PREGABALIN 25 MG PO CAPS: 50.0000 mg | ORAL_CAPSULE | Freq: Two times a day (BID) | ORAL | Status: DC | PRN

## 2018-08-19 MED ORDER — ADULT MULTIVITAMIN W/MINERALS CH
1.0000 | ORAL_TABLET | Freq: Every day | ORAL | Status: DC
  Administered 2018-08-21 – 2018-08-22 (×2): 1 via ORAL
  Filled 2018-08-19 (×2): qty 1

## 2018-08-19 NOTE — Plan of Care (Signed)

## 2018-08-19 NOTE — Anesthesia Preprocedure Evaluation (Addendum)
Anesthesia Evaluation  Patient identified by MRN, date of birth, ID band Patient awake    Reviewed: Allergy & Precautions, NPO status , Patient's Chart, lab work & pertinent test results, reviewed documented beta blocker date and time   History of Anesthesia Complications Negative for: history of anesthetic complications  Airway Mallampati: III  TM Distance: >3 FB Neck ROM: Full    Dental  (+) Missing,    Pulmonary neg pulmonary ROS,    Pulmonary exam normal        Cardiovascular hypertension, Pt. on medications and Pt. on home beta blockers + CAD (on Plavix), + CABG, + Peripheral Vascular Disease and +CHF  Normal cardiovascular exam  TTE 03/31/18: mild LVH, EF >55%, degenerative mitral valve disease with mitral annular calcification and  mildly to moderately increased transvalvular gradient, mild MR, mild to moderate AS   S/p carotid endarterectomy   Neuro/Psych Seizures -,  Anxiety Depression Left frontal lobe mass    GI/Hepatic Neg liver ROS, GERD  Controlled,  Endo/Other  negative endocrine ROSdiabetes, Poorly Controlled, Type 2, Insulin Dependent  Renal/GU Renal InsufficiencyRenal disease     Musculoskeletal  (+) Arthritis , Bilateral BKA   Abdominal   Peds  Hematology negative hematology ROS (+)   Anesthesia Other Findings Day of surgery medications reviewed with the patient.  Reproductive/Obstetrics                           Anesthesia Physical Anesthesia Plan  ASA: III  Anesthesia Plan: General   Post-op Pain Management:    Induction: Intravenous  PONV Risk Score and Plan: 3 and Ondansetron, Treatment may vary due to age or medical condition and Dexamethasone  Airway Management Planned: Oral ETT  Additional Equipment:   Intra-op Plan:   Post-operative Plan: Extubation in OR  Informed Consent: I have reviewed the patients History and Physical, chart, labs and discussed  the procedure including the risks, benefits and alternatives for the proposed anesthesia with the patient or authorized representative who has indicated his/her understanding and acceptance.     Dental advisory given  Plan Discussed with: CRNA  Anesthesia Plan Comments:        Anesthesia Quick Evaluation

## 2018-08-19 NOTE — ED Provider Notes (Signed)
[facility] EMERGENCY DEPARTMENT Provider Note   CSN: [id number] Arrival date & time: 08/19/18  1023    History   Chief Complaint Chief Complaint  Patient presents with  . Hip Pain    HPI [name] is a 63 y.o. female who presents with a hip fracture. PMH significant for DM, HTN, HLD, PVC s/p right CEA, CAD s/p CABG, right BKA, hx of osteomyelitis s/p left transtibial amputation, seizures.  The patient states that she had a fall on the weekend of Mother's Day.  She was on the toilet and when she stood up her right prosthesis slipped off and she had a fall onto her left side.  She has been unable to walk since then.  Pain was initially not severe and she did not think it was broken so she wanted to give it some time.  She is mostly wheelchair-bound but she can ambulate when she has her prosthesis on.  Since the pain was not improving she had imaging of the left hip done on June 1.  She was called by the provider and told to come to the emergency department that same day however she left without being seen due to long wait times.  She decided to call the ambulance today due to pain.     HPI  Past Medical History:  Diagnosis Date  . Anemia   . Anxiety   . Arthritis    "hands"  . CHF (congestive heart failure) ([place])   . Complication of anesthesia    pt felt like she had a hard time waking up after surgery in Feb. 2015.  Marland Kitchen Coronary artery disease   . Depression   . GERD (gastroesophageal reflux disease)    takes Zantac  . High cholesterol   . History of blood transfusion    "one time; related to diabetes I guess"   . Hypertension   . Neuropathy   . Peripheral vascular disease ([place])   . Seizures ([place])   . Type II diabetes mellitus [facility])     Patient Active Problem List   Diagnosis Date Noted  . Acute congestive heart failure ([employer]) 03/29/2018  . DOE (dyspnea on exertion) 03/29/2018  . S/P CABG x 4 03/29/2018  . Status post below-knee amputation of  both lower extremities ([place]) 03/29/2018  . Acute cystitis 10/07/2016  . Pain medication agreement signed 10/06/2016  . Other chronic osteomyelitis, multiple sites ([place]) 07/29/2016  . Gastroesophageal reflux disease with esophagitis 07/09/2016  . CKD stage 3 due to type 2 diabetes mellitus ([place]) 12/26/2015  . Rash 12/26/2015  . Bruit 10/29/2015  . Coronary artery disease 10/29/2015  . Murmur, cardiac 10/29/2015  . Anxiety 09/25/2015  . Chronic pain syndrome 09/25/2015  . High risk medication use 09/25/2015  . History of left lower extremity amputation ([place]) 09/25/2015  . Major depressive disorder with single episode, in full remission ([place]) 09/25/2015  . Neuropathy 09/25/2015  . Localization-related symptomatic epilepsy and epileptic syndromes with complex partial seizures, not intractable, without status epilepticus ([place]) 06/27/2014  . Phantom limb pain ([place]) 06/27/2014  . Localization-related (focal) (partial) epilepsy and epileptic syndromes with complex partial seizures, with intractable epilepsy 10/30/2013  . Left frontal lobe mass 10/30/2013  . Status epilepticus ([place]) 09/02/2013  . Acute encephalopathy 09/02/2013  . Seizures ([place]) 08/29/2013  . Osteomyelitis ([place]) 08/21/2013  . Atherosclerosis of native arteries of the extremities with ulceration ([place]) 07/14/2013  . Protein-calorie malnutrition, severe ([place]) 06/20/2013  . Osteomyelitis of ankle or  foot, left, acute ([place]) 06/17/2013  . Charcot foot due to diabetes mellitus ([place]) 05/13/2013  . PVD (peripheral vascular disease) ([place]) 03/25/2012  . Atherosclerosis of native arteries of the extremities with intermittent claudication 09/16/2011  . Type 2 diabetes mellitus with diabetic mononeuropathy, with long-term current use of insulin ([place]) 03/14/2008  . Hyperlipidemia, unspecified 03/14/2008  . Anemia, normocytic normochromic 03/14/2008  . ANXIETY DEPRESSION 03/14/2008  . Essential hypertension 03/14/2008  . CAD, ARTERY BYPASS  GRAFT 03/14/2008  . CAROTID STENOSIS 03/14/2008  . Right-sided carotid artery disease ([place]) 03/14/2008  . BRUIT 03/14/2008  . History of carotid stenosis 03/14/2008  . CAROTID ENDARTERECTOMY, RIGHT, HX OF 03/14/2008  . CHOLECYSTECTOMY, LAPAROSCOPIC, HX OF 03/14/2008    Past Surgical History:  Procedure Laterality Date  . AMPUTATION Left 08/22/2013   Procedure: LEFT BELOW KNEE AMPUTATION;  Surgeon: Newt Minion, MD;  Location: [facility];  Service: Orthopedics;  Laterality: Left;  . ANKLE FUSION Left 05/11/2013   TIBIOCALCANEAL FUSION   . ANKLE FUSION Left 05/12/2013   Procedure: LEFT TIBIOCALCANEAL FUSION;  Surgeon: Newt Minion, MD;  Location: [facility];  Service: Orthopedics;  Laterality: Left;  Left Tibiocalcaneal Fusion  . APPLICATION OF WOUND VAC  06/17/2013   Procedure: APPLICATION OF WOUND VAC;  Surgeon: Newt Minion, MD;  Location: [facility];  Service: Orthopedics;;  . BELOW KNEE LEG AMPUTATION Right Jan. 2008  . CAROTID ENDARTERECTOMY Right   . CATARACT EXTRACTION Left 12/25/14  . CHOLECYSTECTOMY    . COLONOSCOPY    . CORONARY ARTERY BYPASS GRAFT  12/01/2007   "CABG X4" (05/12/2013)  . DILATION AND CURETTAGE OF UTERUS    . I&D EXTREMITY Left 06/17/2013   Procedure: IRRIGATION AND DEBRIDEMENT EXTREMITY, PLACEMENT ANTIBIOTIC STIMULAN BEADS;  Surgeon: Newt Minion, MD;  Location: [facility];  Service: Orthopedics;  Laterality: Left;  . IRRIGATION AND DEBRIDEMENT ABSCESS Left 06/17/2013   ANKLE           DR [name]  . TOE AMPUTATION Right    "took off a couple toes before the amputation"  . TONSILLECTOMY       OB History   No obstetric history on file.      Home Medications    Prior to Admission medications   Medication Sig Start Date End Date Taking? Authorizing Provider  acetaminophen-codeine (TYLENOL #3) 300-30 MG tablet Take 1 tablet by mouth every 4 (four) hours as needed. 08/04/18   [provider]  albuterol (VENTOLIN HFA) 108 (90 Base) MCG/ACT inhaler INHALE 1-2 PUFFS INTO THE  LUNGS EVERY 6 (SIX) HOURS AS NEEDED FOR WHEEZING OR SHORTNESS OF BREATH. 05/13/17   [provider]  amLODipine (NORVASC) 5 MG tablet Take 2 tablets (10 mg total) by mouth daily. 08/27/17   Libby Maw, MD  amLODipine (NORVASC) 5 MG tablet Take by mouth. 12/26/16   [provider]  aspirin 81 MG tablet Take 1 tablet (81 mg total) by mouth daily. 05/13/17   Libby Maw, MD  atorvastatin (LIPITOR) 80 MG tablet Take 1 tablet (80 mg total) by mouth daily. 05/13/17   Libby Maw, MD  busPIRone (BUSPAR) 15 MG tablet TAKE 2 TABLETS BY MOUTH TWICE A DAY AS NEEDED FOR ANXIETY 08/02/18   [provider]  carvedilol (COREG) 25 MG tablet TAKE 1 TABLET BY MOUTH EVERY DAY Patient taking differently: 12.5 mg 2 (two) times a day.  09/03/17   Libby Maw, MD  clopidogrel (PLAVIX) 75 MG tablet Take 1 tablet (  75 mg total) by mouth daily. 05/13/17   Libby Maw, MD  furosemide (LASIX) 40 MG tablet Take by mouth. 03/31/18   [provider]  irbesartan (AVAPRO) 300 MG tablet Take 300 mg by mouth daily. 07/12/18   [provider]  LANTUS SOLOSTAR 100 UNIT/ML Solostar Pen INJECT 40 UNITS OR AS DIRECTED ONCE A DAY SUBCUTANEOUS 30 DAYS 07/13/18   [provider]  Levetiracetam 750 MG TB24 Take 2 tablets (1,500 mg total) by mouth daily. 08/11/18   Cameron Sprang, MD  lidocaine (LIDODERM) 5 % APPLY 1 PATCH REMOVE AFTER 12 HOURS ONCE A DAY AS NEEDED EXTERNALLY 07/16/18   [provider]  Multiple Vitamins-Minerals (MULTIVITAMINS THER. W/MINERALS) TABS Take 1 tablet by mouth daily.      [provider]  olmesartan (BENICAR) 40 MG tablet Take 40 mg by mouth daily. 05/26/18   [provider]  pregabalin (LYRICA) 50 MG capsule Take 1 tablet twice a day 08/11/18   Cameron Sprang, MD  PROAIR HFA 108 [phone] Base) MCG/ACT inhaler INHALE 1-2 PUFFS INTO THE LUNGS EVERY 6 (SIX) HOURS AS NEEDED FOR WHEEZING OR SHORTNESS  OF BREATH. 11/17/17   Libby Maw, MD  promethazine-dextromethorphan (PROMETHAZINE-DM) 6.25-15 MG/5ML syrup TAKE 5 ML BY MOUTH AS NEEDED FOR COUGH EVERY 6 TO 8 HRS 02/17/18   [provider]  quinapril (ACCUPRIL) 40 MG tablet Take 1 tablet (40 mg total) by mouth daily. 05/13/17   Libby Maw, MD  sertraline (ZOLOFT) 100 MG tablet Take 1 tablet (100 mg total) by mouth 2 (two) times daily. 05/13/17   Libby Maw, MD    Family History Family History  Problem Relation Age of Onset  . Cancer Mother   . Heart disease Father   . Diabetes Father   . Hyperlipidemia Father   . Hypertension Father     Social History Social History   Tobacco Use  . Smoking status: Never Smoker  . Smokeless tobacco: Never Used  Substance Use Topics  . Alcohol use: No  . Drug use: No     Allergies   Tape   Review of Systems Review of Systems  Constitutional: Negative for fever.  Respiratory: Negative for shortness of breath.   Cardiovascular: Negative for chest pain.  Musculoskeletal: Positive for arthralgias and myalgias. Negative for back pain.  Neurological: Negative for weakness and numbness.  All other systems reviewed and are negative.    Physical Exam Updated Vital Signs BP 135/69 (BP Location: Right Arm)   Pulse 79   Temp 98.8 F (37.1 C) (Oral)   Resp 16   SpO2 98%   Physical Exam Vitals signs and nursing note reviewed.  Constitutional:      General: She is not in acute distress.    Appearance: Normal appearance. She is well-developed.     Comments: On 2L via [place]  HENT:     Head: Normocephalic and atraumatic.  Eyes:     General: No scleral icterus.       Right eye: No discharge.        Left eye: No discharge.     Conjunctiva/sclera: Conjunctivae normal.     Pupils: Pupils are equal, round, and reactive to light.  Neck:     Musculoskeletal: Normal range of motion.  Cardiovascular:     Rate and Rhythm: Normal rate and regular rhythm.   Pulmonary:     Effort: Pulmonary effort is normal. No respiratory distress.     Breath  sounds: Normal breath sounds.  Abdominal:     General: There is no distension.     Palpations: Abdomen is soft.     Tenderness: There is no abdominal tenderness.  Musculoskeletal:     Comments: Left lower extremity: Left BKA. No tenderness with hip palpation. Pain is elicited with ROM of L hip. Tenderness over the left medial knee. No obvious injury or deformity. Stump appears well perfused.   Right lower extremity: Right BKA  Skin:    General: Skin is warm and dry.  Neurological:     Mental Status: She is alert and oriented to person, place, and time.  Psychiatric:        Behavior: Behavior normal.      ED Treatments / Results  Labs (all labs ordered are listed, but only abnormal results are displayed) Labs Reviewed  BASIC METABOLIC PANEL - Abnormal; Notable for the following components:      Result Value   Sodium 134 (*)    Chloride 91 (*)    Glucose, Bld 378 (*)    BUN 30 (*)    Creatinine, Ser 1.49 (*)    GFR calc non Af Amer 38 (*)    GFR calc Af Amer 43 (*)    All other components within normal limits  CBC WITH DIFFERENTIAL/PLATELET - Abnormal; Notable for the following components:   RBC 3.26 (*)    Hemoglobin 9.2 (*)    HCT 28.7 (*)    RDW 16.0 (*)    All other components within normal limits  SARS CORONAVIRUS 2 (HOSPITAL ORDER, [facility])  PROTIME-INR  TYPE AND SCREEN    EKG None  Radiology Dg Knee Complete 4 Views Left  Result Date: 08/19/2018 CLINICAL DATA:  Left knee pain after fall 3 days ago. EXAM: LEFT KNEE - COMPLETE 4+ VIEW COMPARISON:  Radiographs of August 16, 2018. FINDINGS: Status post below-knee amputation of left knee. Multiple stents are noted in the distal left superficial femoral artery. No fracture or dislocation is noted. No lytic destruction is seen to suggest osteomyelitis. IMPRESSION: Status post below-knee amputation of left  knee. No acute abnormality is noted. Electronically Signed   By: Marijo Conception M.D.   On: 08/19/2018 11:49    Procedures Procedures (including critical care time)  Medications Ordered in ED Medications - No data to display   Initial Impression / Assessment and Plan / ED Course  I have reviewed the triage vital signs and the nursing notes.  Pertinent labs & imaging results that were available during my care of the patient were reviewed by me and considered in my medical decision making (see chart for details).  63 year old female presents with a fall several weeks ago and recent diagnosis of a left intertrochanteric hip fracture.  Her vital signs are normal here.  She was placed on oxygen by the nurse tech because her sats were initially 90% however I took her off of this in the room and sats maintained above 90% the entire time.  Patient does not have any fever, cough, shortness of breath.  She does not have any tenderness of the hip with palpation but has pain with range of motion.  She is very concerned about a possible surgery.  Will obtain blood work, EKG, chest x-ray, COVID test.  She was encouraged to talk to with the orthopedic surgeon regarding prognosis and possible treatment options.  CBC is remarkable for anemia which is around her baseline.  BMP is  remarkable for hyperglycemia and mild AKI.  X-ray of the knee was obtained since she was mildly tender in this area which is negative.  I consulted Orion Crook with orthopedics who came to see the patient.  He is requesting hospitalist admission. COVID was ordered and is negative. Discussed with Dr. Evangeline Gula with triad who will admit.  Final Clinical Impressions(s) / ED Diagnoses   Final diagnoses:  Fall, initial encounter  Closed fracture of intertrochanteric section of femur, left, initial encounter [facility])    ED Discharge Orders    None       Recardo Evangelist, PA-C 08/19/18 [place], MD 08/20/18 1723

## 2018-08-19 NOTE — ED Notes (Signed)
Report called to 5N

## 2018-08-19 NOTE — ED Triage Notes (Signed)
Pt brought In by ems for a known L intertrochanteric femur fx pt fell off the toilet and had xray's done on 6/1 however pt was unable to wait to see doctor due to high volume in ED the day she came.

## 2018-08-19 NOTE — Progress Notes (Signed)
Initial Nutrition Assessment  RD working remotely.  DOCUMENTATION CODES:   Obesity unspecified  INTERVENTION:   -Once diet advances, add:   -MVI with minerals daily -Glucerna Shake po BID, each supplement provides 220 kcal and 10 grams of protein  NUTRITION DIAGNOSIS:   Increased nutrient needs related to post-op healing as evidenced by estimated needs.  GOAL:   Patient will meet greater than or equal to 90% of their needs  MONITOR:   PO intake, Supplement acceptance, Diet advancement, Labs, Weight trends, Skin, I & O's  REASON FOR ASSESSMENT:   Consult Assessment of nutrition requirement/status  ASSESSMENT:   [name] is a 63 y.o. female with medical history significant of Diabetes mellitus type 2 on insulin, hypertension, hyperlipidemia, unspecified congestive heart failure, peripheral arterial disease status post right carotid endarterectomy, coronary artery disease status post CABG, bilateral BKA, ostial myelitis status post multiple surgeries with ultimate resolution with bilateral BKA's.  And seizures.  The weekend of Mother's Day the patient had a fall.  She was on the toilet when she stood up her right prosthesis gave way and she slipped falling onto her left side.  Since that time she has been unable to walk.  Prior to that she was ambulating with her bilateral prostheses some.  She has been wheelchair-bound since Mother's Day.  Pain was initially not severe but over time has gotten significantly worse.  At the initially she did not think she had broken her hip.  As time went on she had persistent pain and presented to her primary care physician Dr. Harrington Challenger.  He ordered imaging of the left hip which was done on June 1 and she was called by her provider to come to the emergency department on the same day.  On June 1 however she left without being seen due to long wait times.  She called the ambulance and came in today.  Pt admitted with closed intertochanteric lt hip  fracture.   Per orthopedics notes, plan for IMN tomorrow AM.   Per chart review, pt typically ambulates with prosthetics, however, has been wheelchair bound for approximately one month (since Mother's Day).   No meal completion records available to assess. Reviewed wt hx; wt has been stable over the past year.   Pt with increased nutritional needs for post-operative healing and would benefit from addition of nutritional supplements.   Lab Results  Component Value Date   HGBA1C 8.5 (H) 08/19/2018   PTA DM medications are 70 units insulin glargine daily.   Labs reviewed: Na: 134, no CBGS taken (inpatient orders for glycemic control are 0-15 units insulin aspart TID with meals, 0-5 units insulin aspart q HS, 3 units insulin aspart TID with meals, and 70 units insulin glargine at 2200).   NUTRITION - FOCUSED PHYSICAL EXAM:    Most Recent Value  Orbital Region  Unable to assess  Upper Arm Region  Unable to assess  Thoracic and Lumbar Region  Unable to assess  Buccal Region  Unable to assess  Temple Region  Unable to assess  Clavicle Bone Region  Unable to assess  Clavicle and Acromion Bone Region  Unable to assess  Scapular Bone Region  Unable to assess  Dorsal Hand  Unable to assess  Patellar Region  Unable to assess  Anterior Thigh Region  Unable to assess  Posterior Calf Region  Unable to assess  Edema (RD Assessment)  Unable to assess  Hair  Unable to assess  Eyes  Unable to assess  Mouth  Unable to assess  Skin  Unable to assess  Nails  Unable to assess       Diet Order:   Diet Order            Diet NPO time specified  Diet effective now              EDUCATION NEEDS:   No education needs have been identified at this time  Skin:  Skin Assessment: Reviewed RN Assessment  Last BM:  Unknown  Height:   Ht Readings from Last 1 Encounters:  08/19/18 5\' 5"  (1.651 m)    Weight:   Wt Readings from Last 1 Encounters:  08/19/18 88.5 kg    Ideal Body Weight:   49.4 kg(adjusted for bilateral BKAs)  BMI:  Body mass index is 32.47 kg/m.  Estimated Nutritional Needs:   Kcal:  1500-1700  Protein:  85-100 grams  Fluid:  > 1.5 L    Barrie Wale A. Jimmye Norman, RD, LDN, [place] Registered Dietitian II Certified Diabetes Care and Education Specialist Pager: [phone] After hours Pager: [phone]

## 2018-08-19 NOTE — H&P (Signed)
History and Physical    [name] TGG:[id number] DOB: 27-Nov-1955 DOA: 08/19/2018  PCP: [name], MD  Patient coming from: Home  I have personally briefly reviewed patient's old medical records in [place]  Chief Complaint: Left hip pain since Mother's Day  HPI: [name] is a 63 y.o. female with medical history significant of Diabetes mellitus type 2 on insulin, hypertension, hyperlipidemia, unspecified congestive heart failure, peripheral arterial disease status post right carotid endarterectomy, coronary artery disease status post CABG, bilateral BKA, ostial myelitis status post multiple surgeries with ultimate resolution with bilateral BKA's.  And seizures.  The weekend of Mother's Day the patient had a fall.  She was on the toilet when she stood up her right prosthesis gave way and she slipped falling onto her left side.  Since that time she has been unable to walk.  Prior to that she was ambulating with her bilateral prostheses some.  She has been wheelchair-bound since Mother's Day.  Pain was initially not severe but over time has gotten significantly worse.  At the initially she did not think she had broken her hip.  As time went on she had persistent pain and presented to her primary care physician Dr. Harrington Challenger.  He ordered imaging of the left hip which was done on June 1 and she was called by her provider to come to the emergency department on the same day.  On June 1 however she left without being seen due to long wait times.  She called the ambulance and came in today.  ED Course: Patient fully evaluated and seen by PA.  She was given a bolus of 500 mL of sodium chloride.  She was maintained n.p.o.  Baseline is about 1.1.  Last recorded in 2015.  Today it is 1.49.  GFR is approximately 38.  Glucose is elevated at 378.  Hemoglobin is 9.2 baseline since 2015.  Review of Systems: As per HPI otherwise all other systems reviewed and  negative.   Past Medical History:   Diagnosis Date  . Anemia   . Anxiety   . Arthritis    "hands"  . CHF (congestive heart failure) ([place])   . Complication of anesthesia    pt felt like she had a hard time waking up after surgery in Feb. 2015.  Marland Kitchen Coronary artery disease   . Depression   . GERD (gastroesophageal reflux disease)    takes Zantac  . High cholesterol   . History of blood transfusion    "one time; related to diabetes I guess"   . Hypertension   . Neuropathy   . Peripheral vascular disease ([place])   . Seizures ([place])   . Type II diabetes mellitus ([place])     Past Surgical History:  Procedure Laterality Date  . AMPUTATION Left 08/22/2013   Procedure: LEFT BELOW KNEE AMPUTATION;  Surgeon: Newt Minion, MD;  Location: [facility];  Service: Orthopedics;  Laterality: Left;  . ANKLE FUSION Left 05/11/2013   TIBIOCALCANEAL FUSION   . ANKLE FUSION Left 05/12/2013   Procedure: LEFT TIBIOCALCANEAL FUSION;  Surgeon: Newt Minion, MD;  Location: [facility];  Service: Orthopedics;  Laterality: Left;  Left Tibiocalcaneal Fusion  . APPLICATION OF WOUND VAC  06/17/2013   Procedure: APPLICATION OF WOUND VAC;  Surgeon: Newt Minion, MD;  Location: [facility];  Service: Orthopedics;;  . BELOW KNEE LEG AMPUTATION Right Jan. 2008  . CAROTID ENDARTERECTOMY Right   . CATARACT EXTRACTION Left 12/25/14  .  CHOLECYSTECTOMY    . COLONOSCOPY    . CORONARY ARTERY BYPASS GRAFT  12/01/2007   "CABG X4" (05/12/2013)  . DILATION AND CURETTAGE OF UTERUS    . I&D EXTREMITY Left 06/17/2013   Procedure: IRRIGATION AND DEBRIDEMENT EXTREMITY, PLACEMENT ANTIBIOTIC STIMULAN BEADS;  Surgeon: Newt Minion, MD;  Location: [facility];  Service: Orthopedics;  Laterality: Left;  . IRRIGATION AND DEBRIDEMENT ABSCESS Left 06/17/2013   ANKLE           DR [name]  . TOE AMPUTATION Right    "took off a couple toes before the amputation"  . TONSILLECTOMY      Social History   Social History Narrative   Right handed      reports that she has never smoked. She has never used  smokeless tobacco. She reports that she does not drink alcohol or use drugs.  Allergies  Allergen Reactions  . Tape Rash    Redness, rash, itchiness Redness, rash, itchiness Redness, rash, itchiness No Plastic tape, paper tape OK Redness, rash, itchiness Redness, rash, itchiness No Plastic tape, paper tape OK    Family History  Problem Relation Age of Onset  . Cancer Mother   . Heart disease Father   . Diabetes Father   . Hyperlipidemia Father   . Hypertension Father      Prior to Admission medications   Medication Sig Start Date End Date Taking? Authorizing Provider  acetaminophen-codeine (TYLENOL #3) 300-30 MG tablet Take 1 tablet by mouth every 4 (four) hours as needed. 08/04/18  Yes [provider]  albuterol (VENTOLIN HFA) 108 (90 Base) MCG/ACT inhaler Inhale 1-2 puffs into the lungs every 6 (six) hours as needed for wheezing or shortness of breath.  05/13/17  Yes [provider]  amLODipine (NORVASC) 5 MG tablet Take 2 tablets (10 mg total) by mouth daily. Patient taking differently: Take 5 mg by mouth 2 (two) times a day.  08/27/17  Yes Libby Maw, MD  aspirin 81 MG tablet Take 1 tablet (81 mg total) by mouth daily. 05/13/17  Yes Libby Maw, MD  atorvastatin (LIPITOR) 80 MG tablet Take 1 tablet (80 mg total) by mouth daily. 05/13/17  Yes Libby Maw, MD  busPIRone (BUSPAR) 15 MG tablet Take 30 mg by mouth 2 (two) times daily as needed (anxiety).  08/02/18  Yes [provider]  carvedilol (COREG) 25 MG tablet TAKE 1 TABLET BY MOUTH EVERY DAY Patient taking differently: Take 25 mg by mouth daily.  09/03/17  Yes Libby Maw, MD  clopidogrel (PLAVIX) 75 MG tablet Take 1 tablet (75 mg total) by mouth daily. 05/13/17  Yes Libby Maw, MD  furosemide (LASIX) 40 MG tablet Take 40 mg by mouth daily.  03/31/18  Yes [provider]  LANTUS SOLOSTAR 100 UNIT/ML Solostar Pen Inject 70 Units into the  skin daily. Taking in the evening 07/13/18  Yes [provider]  Levetiracetam 750 MG TB24 Take 2 tablets (1,500 mg total) by mouth daily. Patient taking differently: Take 1,500 mg by mouth at bedtime.  08/11/18  Yes Cameron Sprang, MD  lidocaine (LIDODERM) 5 % Place 1 patch onto the skin daily. evening 07/16/18  Yes [provider]  Multiple Vitamins-Minerals (MULTIVITAMINS THER. W/MINERALS) TABS Take 1 tablet by mouth daily.     Yes [provider]  olmesartan (BENICAR) 40 MG tablet Take 40 mg by mouth daily. 05/26/18  Yes [provider]  pregabalin (LYRICA) 50 MG capsule Take  1 tablet twice a day Patient taking differently: Take 50 mg by mouth 2 (two) times daily as needed (pain).  08/11/18  Yes Cameron Sprang, MD  sertraline (ZOLOFT) 100 MG tablet Take 1 tablet (100 mg total) by mouth 2 (two) times daily. 05/13/17  Yes Libby Maw, MD    Physical Exam:  Constitutional: NAD, calm, not really uncomfortable due to hip pain Vitals:   08/19/18 1033 08/19/18 1200 08/19/18 1300  BP: 135/69 (!) 166/65 (!) 141/73  Pulse: 79 81   Resp: 16    Temp: 98.8 F (37.1 C)    TempSrc: Oral    SpO2: 98% 93%    Eyes: PERRL, lids and conjunctivae normal ENMT: Mucous membranes are moist. Posterior pharynx clear of any exudate or lesions.Normal dentition.  Neck: normal, supple, no masses, no thyromegaly Respiratory: clear to auscultation bilaterally, no wheezing, no crackles. Normal respiratory effort. No accessory muscle use.  Cardiovascular: Regular rate and rhythm, no murmurs / rubs / gallops. No extremity edema. 2+ pedal pulses. No carotid bruits.  Abdomen: no tenderness, no masses palpated. No hepatosplenomegaly. Bowel sounds positive.  Musculoskeletal: That is post bilateral below the knee amputations, no clubbing / cyanosis. No joint deformity upper and lower extremities. Good ROM except for left lower extremity hip joint with pain on all range of motion  and palpation., no contractures. Normal muscle tone.  Skin: no rashes, lesions, ulcers. No induration Neurologic: CN 2-12 grossly intact. Sensation intact, DTR normal. Strength 5/5 in upper extremities.  Psychiatric: Normal judgment and insight. Alert and oriented x 3. Normal mood.    Labs on Admission: I have personally reviewed following labs and imaging studies  CBC: Recent Labs  Lab 08/19/18 1150  WBC 5.3  NEUTROABS 3.9  HGB 9.2*  HCT 28.7*  MCV 88.0  PLT 423   Basic Metabolic Panel: Recent Labs  Lab 08/19/18 1150  NA 134*  K 3.9  CL 91*  CO2 29  GLUCOSE 378*  BUN 30*  CREATININE 1.49*  CALCIUM 9.3    Recent Labs  Lab 08/19/18 1150  INR 1.2   Urine analysis:    Component Value Date/Time   COLORURINE YELLOW 09/02/2013 1440   APPEARANCEUR CLEAR 09/02/2013 1440   LABSPEC 1.016 09/02/2013 1440   PHURINE 6.5 09/02/2013 1440   GLUCOSEU 100 (A) 09/02/2013 1440   HGBUR MODERATE (A) 09/02/2013 1440   BILIRUBINUR NEGATIVE 09/02/2013 1440   KETONESUR 15 (A) 09/02/2013 1440   PROTEINUR >300 (A) 09/02/2013 1440   UROBILINOGEN 0.2 09/02/2013 1440   NITRITE NEGATIVE 09/02/2013 1440   LEUKOCYTESUR NEGATIVE 09/02/2013 1440    Radiological Exams on Admission: Dg Knee Complete 4 Views Left  Result Date: 08/19/2018 CLINICAL DATA:  Left knee pain after fall 3 days ago. EXAM: LEFT KNEE - COMPLETE 4+ VIEW COMPARISON:  Radiographs of August 16, 2018. FINDINGS: Status post below-knee amputation of left knee. Multiple stents are noted in the distal left superficial femoral artery. No fracture or dislocation is noted. No lytic destruction is seen to suggest osteomyelitis. IMPRESSION: Status post below-knee amputation of left knee. No acute abnormality is noted. Electronically Signed   By: Marijo Conception M.D.   On: 08/19/2018 11:49    EKG: Independently reviewed. Sinus rhythm  Nonspecific repol abnormality, diffuse leads compared to 09/08/2013 tachycardia has resolved.   Assessment/Plan Principal Problem:   Closed intertrochanteric fracture of hip, left, initial encounter [facility]) Active Problems:   Type 2 diabetes mellitus with diabetic mononeuropathy, with long-term current use  of insulin ([place])   Essential hypertension   PVD (peripheral vascular disease) ([facility])   Phantom limb pain ([facility])   Hyperlipidemia, unspecified   Anemia, normocytic normochromic   ANXIETY DEPRESSION   CAD, ARTERY BYPASS GRAFT    1.  Closed intertrochanteric fracture of the left hip: Appreciate surgical input from orthopedics.  Patient to go to the OR today.  Hold Plavix.  Likely restart in 2 to 3 days.  Patient has been n.p.o. today.  2.  Type 2 diabetes mellitus with diabetic mononeuropathy and long-term current use of insulin: Blood glucose is very much out of control today.  Will check a hemoglobin A1c.  Will reorder home medications.  Patient states she does not believe she took her medications today.  Start diabetic sliding scale insulin protocol.  3.  Essential hypertension: Pressures are moderately elevated patient has not had her medications.  Will order.  4.  Peripheral vascular disease: Patient status post bilateral of the knee amputations of her lower extremity.  She is also known to have carotid disease a status post carotid endarterectomy.  5.  Phantom limb pain: Continue Lyrica as noted.  6.  Hyperlipidemia unspecified: Continue home medication regimen.  7.  Chronic anemia that is normocytic and normochromic: Stable since 03/24/2013.  8.  Anxiety and depression: Continue home medication management and provide supportive care.  9.  Coronary artery disease status post coronary artery bypass graft: Noted continue home medications.   10.  Unspecified congestive heart failure: Able to see an echocardiogram from March 31, 2018 with results as follows:    Technically difficult study due to chest wall/lung interference   Left ventricular hypertrophy - mild   Normal left  ventricular systolic function, ejection fraction > 55%   Dilated left atrium - mild   Degenerative mitral valve disease with mitral annular calcification and   mildly to moderately increased transvalvular gradient   Mitral regurgitation - mild   Aortic stenosis - mild to moderate   Normal right ventricular systolic function   Mildly elevated right atrial pressure .Based upon patient's ejection fraction it appears the patient may have some diastolic dysfunction. We will continue home level, amlodipine, atorvastatin, aspirin, Lasix.   DVT prophylaxis: Lovenox Code Status: Full code Family Communication: Chance Everetts @ [phone] Disposition Plan: Will likely need rehab prior to discharge home Consults called: Dr. Dellis Filbert from orthopedic surgery Admission status: Inpatient  Admit - It is my clinical opinion that admission to INPATIENT is reasonable and necessary because of the expectation that this patient will require hospital care that crosses at least 2 midnights to treat this condition based on the medical complexity of the problems presented.  Given the aforementioned information, the predictability of an adverse outcome is felt to be significant.   Lady Deutscher MD FACP [facility] Pager [phone]  How to contact the [facility] Attending or Consulting provider [place] or covering provider during after hours [place], for this patient?  1. Check the care team in [facility] and look for a) attending/consulting [facility] provider listed and b) the [facility] team listed 2. Log into [email or web address] and use [place]'s universal password to access. If you do not have the password, please contact the hospital operator. 3. Locate the [facility] provider you are looking for under [facility] and page to a number that you can be directly reached. 4. If you still have difficulty reaching the provider, please page the [facility] (Director on Call) for the Hospitalists listed on amion  for assistance.  If  7PM-7AM, please contact night-coverage [email or web address] Password [email or web address]  08/19/2018, 2:14 PM

## 2018-08-19 NOTE — ED Notes (Signed)
PA Silvestre Gunner at bedside

## 2018-08-19 NOTE — Consult Note (Signed)
Reason for Consult:Left hip fx Referring Physician: A Sanari Foster is an 63 y.o. female.  HPI: [name] fell while trying to get up from the toilet on Mother's Day. She hurt her left hip. Her sons helped her get up and into bed. She normally ambulates with the aid of bilateral BKA prostheses but has been unable to ambulate since this happened and has been getting around via WC. Her sons finally told her that she needed to get evaluated and x-rays showed Tonya left hip fx and orthopedic surgery was consulted. She c/o localized pain to the hip.  Past Medical History:  Diagnosis Date  . Anemia   . Anxiety   . Arthritis    "hands"  . CHF (congestive heart failure) ([name])   . Complication of anesthesia    pt felt like she had Tonya hard time waking up after surgery in Feb. 2015.  Marland Kitchen Coronary artery disease   . Depression   . GERD (gastroesophageal reflux disease)    takes Zantac  . High cholesterol   . History of blood transfusion    "one time; related to diabetes I guess"   . Hypertension   . Neuropathy   . Peripheral vascular disease ([name])   . Seizures ([name])   . Type II diabetes mellitus ([name])     Past Surgical History:  Procedure Laterality Date  . AMPUTATION Left 08/22/2013   Procedure: LEFT BELOW KNEE AMPUTATION;  Surgeon: Newt Minion, MD;  Location: [name];  Service: Orthopedics;  Laterality: Left;  . ANKLE FUSION Left 05/11/2013   TIBIOCALCANEAL FUSION   . ANKLE FUSION Left 05/12/2013   Procedure: LEFT TIBIOCALCANEAL FUSION;  Surgeon: Newt Minion, MD;  Location: [name];  Service: Orthopedics;  Laterality: Left;  Left Tibiocalcaneal Fusion  . APPLICATION OF WOUND VAC  06/17/2013   Procedure: APPLICATION OF WOUND VAC;  Surgeon: Newt Minion, MD;  Location: [name];  Service: Orthopedics;;  . BELOW KNEE LEG AMPUTATION Right Jan. 2008  . CAROTID ENDARTERECTOMY Right   . CATARACT EXTRACTION Left 12/25/14  . CHOLECYSTECTOMY    . COLONOSCOPY    . CORONARY ARTERY BYPASS GRAFT   12/01/2007   "CABG X4" (05/12/2013)  . DILATION AND CURETTAGE OF UTERUS    . I&D EXTREMITY Left 06/17/2013   Procedure: IRRIGATION AND DEBRIDEMENT EXTREMITY, PLACEMENT ANTIBIOTIC STIMULAN BEADS;  Surgeon: Newt Minion, MD;  Location: [name];  Service: Orthopedics;  Laterality: Left;  . IRRIGATION AND DEBRIDEMENT ABSCESS Left 06/17/2013   ANKLE           DR [name]  . TOE AMPUTATION Right    "took off Tonya couple toes before the amputation"  . TONSILLECTOMY      Family History  Problem Relation Age of Onset  . Cancer Mother   . Heart disease Father   . Diabetes Father   . Hyperlipidemia Father   . Hypertension Father     Social History:  reports that she has never smoked. She has never used smokeless tobacco. She reports that she does not drink alcohol or use drugs.  Allergies:  Allergies  Allergen Reactions  . Tape Rash    Redness, rash, itchiness Redness, rash, itchiness Redness, rash, itchiness No Plastic tape, paper tape OK Redness, rash, itchiness Redness, rash, itchiness No Plastic tape, paper tape OK    Medications: I have reviewed the patient's current medications.  Results for orders placed or performed during the hospital encounter of 08/19/18 (from the  past 48 hour(s))  SARS Coronavirus 2 (CEPHEID - Performed in [facility] hospital lab), Hosp Order     Status: None   Collection Time: 08/19/18 10:57 AM  Result Value Ref Range   SARS Coronavirus 2 NEGATIVE NEGATIVE    Comment: (NOTE) If result is NEGATIVE SARS-CoV-2 target nucleic acids are NOT DETECTED. The SARS-CoV-2 RNA is generally detectable in upper and lower  respiratory specimens during the acute phase of infection. The lowest  concentration of SARS-CoV-2 viral copies this assay can detect is 250  copies / mL. Tonya negative result does not preclude SARS-CoV-2 infection  and should not be used as the sole basis for treatment or other  patient management decisions.  Tonya negative result may occur with  improper  specimen collection / handling, submission of specimen other  than nasopharyngeal swab, presence of viral mutation(s) within the  areas targeted by this assay, and inadequate number of viral copies  (<250 copies / mL). Tonya negative result must be combined with clinical  observations, patient history, and epidemiological information. If result is POSITIVE SARS-CoV-2 target nucleic acids are DETECTED. The SARS-CoV-2 RNA is generally detectable in upper and lower  respiratory specimens dur ing the acute phase of infection.  Positive  results are indicative of active infection with SARS-CoV-2.  Clinical  correlation with patient history and other diagnostic information is  necessary to determine patient infection status.  Positive results do  not rule out bacterial infection or co-infection with other viruses. If result is PRESUMPTIVE POSTIVE SARS-CoV-2 nucleic acids MAY BE PRESENT.   Tonya presumptive positive result was obtained on the submitted specimen  and confirmed on repeat testing.  While 2019 novel coronavirus  (SARS-CoV-2) nucleic acids may be present in the submitted sample  additional confirmatory testing may be necessary for epidemiological  and / or clinical management purposes  to differentiate between  SARS-CoV-2 and other Sarbecovirus currently known to infect humans.  If clinically indicated additional testing with an alternate test  methodology [phone]) is advised. The SARS-CoV-2 RNA is generally  detectable in upper and lower respiratory sp ecimens during the acute  phase of infection. The expected result is Negative. Fact Sheet for Patients:  StrictlyIdeas.no Fact Sheet for Healthcare Providers: BankingDealers.co.za This test is not yet approved or cleared by the [place] FDA and has been authorized for detection and/or diagnosis of SARS-CoV-2 by FDA under an Emergency Use Authorization (EUA).  This EUA will remain in  effect (meaning this test can be used) for the duration of the COVID-19 declaration under Section 564(b)(1) of the Act, 21 U.S.C. section 360bbb-3(b)(1), unless the authorization is terminated or revoked sooner. Performed at [facility], [place] [address] [place], [place] [address]   Basic metabolic panel     Status: Abnormal   Collection Time: 08/19/18 11:50 AM  Result Value Ref Range   Sodium 134 (L) 135 - 145 mmol/L   Potassium 3.9 3.5 - 5.1 mmol/L   Chloride 91 (L) 98 - 111 mmol/L   CO2 29 22 - 32 mmol/L   Glucose, Bld 378 (H) 70 - 99 mg/dL   BUN 30 (H) 8 - 23 mg/dL   Creatinine, Ser 1.49 (H) 0.44 - 1.00 mg/dL   Calcium 9.3 8.9 - 10.3 mg/dL   GFR calc non Af Amer 38 (L) >60 mL/min   GFR calc Af Amer 43 (L) >60 mL/min   Anion gap 14 5 - 15    Comment: Performed at [facility],  [address], [place], [place] [address]  CBC WITH DIFFERENTIAL     Status: Abnormal   Collection Time: 08/19/18 11:50 AM  Result Value Ref Range   WBC 5.3 4.0 - 10.5 K/uL   RBC 3.26 (L) 3.87 - 5.11 MIL/uL   Hemoglobin 9.2 (L) 12.0 - 15.0 g/dL   HCT 28.7 (L) 36.0 - 46.0 %   MCV 88.0 80.0 - 100.0 fL   MCH 28.2 26.0 - 34.0 pg   MCHC 32.1 30.0 - 36.0 g/dL   RDW 16.0 (H) 11.5 - 15.5 %   Platelets 221 150 - 400 K/uL   nRBC 0.0 0.0 - 0.2 %   Neutrophils Relative % 74 %   Neutro Abs 3.9 1.7 - 7.7 K/uL   Lymphocytes Relative 15 %   Lymphs Abs 0.8 0.7 - 4.0 K/uL   Monocytes Relative 7 %   Monocytes Absolute 0.4 0.1 - 1.0 K/uL   Eosinophils Relative 4 %   Eosinophils Absolute 0.2 0.0 - 0.5 K/uL   Basophils Relative 0 %   Basophils Absolute 0.0 0.0 - 0.1 K/uL   Immature Granulocytes 0 %   Abs Immature Granulocytes 0.02 0.00 - 0.07 K/uL    Comment: Performed at [facility], [address], [place], [place] [address]  Protime-INR     Status: None   Collection Time: 08/19/18 11:50 AM  Result Value Ref Range   Prothrombin Time 14.6 11.4 - 15.2 seconds   INR 1.2 0.8 - 1.2    Comment:  (NOTE) INR goal varies based on device and disease states. Performed at [facility], [place] [address], [place], [place] [address]     Dg Knee Complete 4 Views Left  Result Date: 08/19/2018 CLINICAL DATA:  Left knee pain after fall 3 days ago. EXAM: LEFT KNEE - COMPLETE 4+ VIEW COMPARISON:  Radiographs of August 16, 2018. FINDINGS: Status post below-knee amputation of left knee. Multiple stents are noted in the distal left superficial femoral artery. No fracture or dislocation is noted. No lytic destruction is seen to suggest osteomyelitis. IMPRESSION: Status post below-knee amputation of left knee. No acute abnormality is noted. Electronically Signed   By: Marijo Conception M.D.   On: 08/19/2018 11:49    Review of Systems  Constitutional: Negative for weight loss.  HENT: Negative for ear discharge, ear pain, hearing loss and tinnitus.   Eyes: Negative for blurred vision, double vision, photophobia and pain.  Respiratory: Negative for cough, sputum production and shortness of breath.   Cardiovascular: Negative for chest pain.  Gastrointestinal: Negative for abdominal pain, nausea and vomiting.  Genitourinary: Negative for dysuria, flank pain, frequency and urgency.  Musculoskeletal: Positive for joint pain (Left hip). Negative for back pain, falls, myalgias and neck pain.  Neurological: Negative for dizziness, tingling, sensory change, focal weakness, loss of consciousness and headaches.  Endo/Heme/Allergies: Does not bruise/bleed easily.  Psychiatric/Behavioral: Negative for depression, memory loss and substance abuse. [name] not nervous/anxious.    Blood pressure 135/69, pulse 79, temperature 98.8 F (37.1 C), temperature source Oral, resp. rate 16, SpO2 98 %. Physical Exam  Constitutional: She appears well-developed and well-nourished. No distress.  HENT:  Head: Normocephalic and atraumatic.  Eyes: Conjunctivae are normal. Right eye exhibits no discharge. Left eye exhibits no  discharge. No scleral icterus.  Neck: Normal range of motion.  Cardiovascular: Normal rate and regular rhythm.  Respiratory: Effort normal. No respiratory distress.  Musculoskeletal:     Comments: LLE No  traumatic wounds, ecchymosis, or rash  S/p BKA, hip mild TTP  No knee effusion  Knee stable to varus/ valgus and anterior/posterior stress  Neurological: She is alert.  Skin: Skin is warm and dry. She is not diaphoretic.  Psychiatric: She has Tonya normal mood and affect. Her behavior is normal.    Assessment/Plan: Left hip fx -- Plan IMN tomorrow AM with Dr. Doreatha Martin. Please hold Plavix tomorrow morning. Multiple medical problems including DM, HTN, HLD, PVC s/p right CEA, CAD s/p CABG, right BKA, hx of osteomyelitis s/p left transtibial amputation, and seizures -- IM to admit and manage, appreciate their help.    Lisette Abu, PA-C Orthopedic Surgery [phone] 08/19/2018, 1:00 PM

## 2018-08-19 NOTE — ED Notes (Signed)
Pt taken to radiology

## 2018-08-20 ENCOUNTER — Inpatient Hospital Stay (HOSPITAL_COMMUNITY): Payer: Medicare Other

## 2018-08-20 ENCOUNTER — Encounter (HOSPITAL_COMMUNITY)
Admission: EM | Discharge status: Against Medical Advice | Payer: Self-pay | Source: Home / Self Care | Attending: Internal Medicine

## 2018-08-20 ENCOUNTER — Inpatient Hospital Stay (HOSPITAL_COMMUNITY): Payer: Medicare Other | Admitting: Certified Registered"

## 2018-08-20 DIAGNOSIS — I739 Peripheral vascular disease, unspecified: Secondary | ICD-10-CM

## 2018-08-20 DIAGNOSIS — I10 Essential (primary) hypertension: Secondary | ICD-10-CM

## 2018-08-20 DIAGNOSIS — Z794 Long term (current) use of insulin: Secondary | ICD-10-CM

## 2018-08-20 DIAGNOSIS — E1141 Type 2 diabetes mellitus with diabetic mononeuropathy: Secondary | ICD-10-CM

## 2018-08-20 HISTORY — PX: INTRAMEDULLARY (IM) NAIL INTERTROCHANTERIC: SHX5875

## 2018-08-20 LAB — GLUCOSE, CAPILLARY
Glucose-Capillary: 443 mg/dL — ABNORMAL HIGH (ref 70–99)
Glucose-Capillary: 390 mg/dL — ABNORMAL HIGH (ref 70–99)
Glucose-Capillary: 401 mg/dL — ABNORMAL HIGH (ref 70–99)
Glucose-Capillary: 309 mg/dL — ABNORMAL HIGH (ref 70–99)
Glucose-Capillary: 260 mg/dL — ABNORMAL HIGH (ref 70–99)
Glucose-Capillary: 251 mg/dL — ABNORMAL HIGH (ref 70–99)
Glucose-Capillary: 216 mg/dL — ABNORMAL HIGH (ref 70–99)

## 2018-08-20 LAB — HIV ANTIBODY (ROUTINE TESTING W REFLEX): HIV Screen 4th Generation wRfx: NONREACTIVE

## 2018-08-20 LAB — BASIC METABOLIC PANEL WITH GFR
Anion gap: 12 (ref 5–15)
BUN: 26 mg/dL — ABNORMAL HIGH (ref 8–23)
CO2: 30 mmol/L (ref 22–32)
Calcium: 8.9 mg/dL (ref 8.9–10.3)
Chloride: 93 mmol/L — ABNORMAL LOW (ref 98–111)
Creatinine, Ser: 1.53 mg/dL — ABNORMAL HIGH (ref 0.44–1.00)
GFR calc Af Amer: 42 mL/min — ABNORMAL LOW
GFR calc non Af Amer: 36 mL/min — ABNORMAL LOW
Glucose, Bld: 258 mg/dL — ABNORMAL HIGH (ref 70–99)
Potassium: 3.6 mmol/L (ref 3.5–5.1)
Sodium: 135 mmol/L (ref 135–145)

## 2018-08-20 LAB — CBC
HCT: 27.8 % — ABNORMAL LOW (ref 36.0–46.0)
HCT: 24.8 % — ABNORMAL LOW (ref 36.0–46.0)
Hemoglobin: 8.9 g/dL — ABNORMAL LOW (ref 12.0–15.0)
Hemoglobin: 7.9 g/dL — ABNORMAL LOW (ref 12.0–15.0)
MCH: 28.1 pg (ref 26.0–34.0)
MCH: 28.1 pg (ref 26.0–34.0)
MCHC: 32 g/dL (ref 30.0–36.0)
MCHC: 31.9 g/dL (ref 30.0–36.0)
MCV: 87.7 fL (ref 80.0–100.0)
MCV: 88.3 fL (ref 80.0–100.0)
Platelets: 211 K/uL (ref 150–400)
Platelets: 237 10*3/uL (ref 150–400)
RBC: 3.17 MIL/uL — ABNORMAL LOW (ref 3.87–5.11)
RBC: 2.81 MIL/uL — ABNORMAL LOW (ref 3.87–5.11)
RDW: 15.9 % — ABNORMAL HIGH (ref 11.5–15.5)
RDW: 16.2 % — ABNORMAL HIGH (ref 11.5–15.5)
WBC: 5.7 K/uL (ref 4.0–10.5)
WBC: 9.7 10*3/uL (ref 4.0–10.5)
nRBC: 0 % (ref 0.0–0.2)
nRBC: 0 % (ref 0.0–0.2)

## 2018-08-20 SURGERY — FIXATION, FRACTURE, INTERTROCHANTERIC, WITH INTRAMEDULLARY ROD
Anesthesia: General | Site: Leg Upper | Laterality: Left

## 2018-08-20 MED ORDER — CEFAZOLIN SODIUM-DEXTROSE 2-4 GM/100ML-% IV SOLN
2.0000 g | Freq: Three times a day (TID) | INTRAVENOUS | Status: AC
  Administered 2018-08-20 – 2018-08-21 (×3): 2 g via INTRAVENOUS
  Filled 2018-08-20 (×3): qty 100

## 2018-08-20 MED ORDER — LIDOCAINE 2% (20 MG/ML) 5 ML SYRINGE
INTRAMUSCULAR | Status: DC | PRN
  Administered 2018-08-20 (×2): 40 mg via INTRAVENOUS

## 2018-08-20 MED ORDER — VANCOMYCIN HCL 1000 MG IV SOLR
INTRAVENOUS | Status: DC | PRN
  Administered 2018-08-20: 1000 mg

## 2018-08-20 MED ORDER — LACTATED RINGERS IV SOLN
INTRAVENOUS | Status: DC
  Administered 2018-08-20 (×2): via INTRAVENOUS

## 2018-08-20 MED ORDER — ALBUMIN HUMAN 5 % IV SOLN
INTRAVENOUS | Status: AC
  Filled 2018-08-20: qty 250

## 2018-08-20 MED ORDER — GLYCOPYRROLATE PF 0.2 MG/ML IJ SOSY
PREFILLED_SYRINGE | INTRAMUSCULAR | Status: AC
  Filled 2018-08-20: qty 1

## 2018-08-20 MED ORDER — ASPIRIN EC 325 MG PO TBEC
325.0000 mg | DELAYED_RELEASE_TABLET | Freq: Every day | ORAL | Status: DC
  Administered 2018-08-21 – 2018-08-22 (×2): 325 mg via ORAL
  Filled 2018-08-20 (×2): qty 1

## 2018-08-20 MED ORDER — FENTANYL CITRATE (PF) 100 MCG/2ML IJ SOLN
INTRAMUSCULAR | Status: DC | PRN
  Administered 2018-08-20: 50 ug via INTRAVENOUS
  Administered 2018-08-20 (×2): 100 ug via INTRAVENOUS

## 2018-08-20 MED ORDER — 0.9 % SODIUM CHLORIDE (POUR BTL) OPTIME
TOPICAL | Status: DC | PRN
  Administered 2018-08-20: 1000 mL

## 2018-08-20 MED ORDER — PROPOFOL 10 MG/ML IV BOLUS
INTRAVENOUS | Status: DC | PRN
  Administered 2018-08-20: 140 mg via INTRAVENOUS

## 2018-08-20 MED ORDER — ACETAMINOPHEN 10 MG/ML IV SOLN: 1000.0000 mg | Freq: Once | INTRAVENOUS | Status: DC | PRN

## 2018-08-20 MED ORDER — EPHEDRINE SULFATE-NACL 50-0.9 MG/10ML-% IV SOSY
PREFILLED_SYRINGE | INTRAVENOUS | Status: DC | PRN
  Administered 2018-08-20: 5 mg via INTRAVENOUS

## 2018-08-20 MED ORDER — INSULIN ASPART 100 UNIT/ML ~~LOC~~ SOLN
8.0000 [IU] | Freq: Once | SUBCUTANEOUS | Status: DC
  Filled 2018-08-20: qty 0.08

## 2018-08-20 MED ORDER — PROPOFOL 10 MG/ML IV BOLUS
INTRAVENOUS | Status: AC
  Filled 2018-08-20: qty 20

## 2018-08-20 MED ORDER — GABAPENTIN 100 MG PO CAPS
100.0000 mg | ORAL_CAPSULE | Freq: Three times a day (TID) | ORAL | Status: DC
  Administered 2018-08-20: 100 mg via ORAL
  Filled 2018-08-20 (×5): qty 1

## 2018-08-20 MED ORDER — ROCURONIUM BROMIDE 10 MG/ML (PF) SYRINGE
PREFILLED_SYRINGE | INTRAVENOUS | Status: DC | PRN
  Administered 2018-08-20: 70 mg via INTRAVENOUS

## 2018-08-20 MED ORDER — OXYCODONE HCL 5 MG/5ML PO SOLN: 5.0000 mg | Freq: Once | ORAL | Status: DC | PRN

## 2018-08-20 MED ORDER — MIDAZOLAM HCL 2 MG/2ML IJ SOLN
INTRAMUSCULAR | Status: AC
  Filled 2018-08-20: qty 2

## 2018-08-20 MED ORDER — SODIUM CHLORIDE 0.9 % IV SOLN
INTRAVENOUS | Status: DC | PRN
  Administered 2018-08-20: 30 ug/min via INTRAVENOUS

## 2018-08-20 MED ORDER — CEFAZOLIN SODIUM-DEXTROSE 2-4 GM/100ML-% IV SOLN
INTRAVENOUS | Status: AC
  Filled 2018-08-20: qty 100

## 2018-08-20 MED ORDER — PHENYLEPHRINE HCL (PRESSORS) 10 MG/ML IV SOLN
80.0000 ug | Freq: Once | INTRAVENOUS | Status: AC
  Administered 2018-08-20: 13:00:00 80 ug via INTRAVENOUS

## 2018-08-20 MED ORDER — ONDANSETRON HCL 4 MG/2ML IJ SOLN
INTRAMUSCULAR | Status: DC | PRN
  Administered 2018-08-20: 4 mg via INTRAVENOUS

## 2018-08-20 MED ORDER — SUGAMMADEX SODIUM 200 MG/2ML IV SOLN
INTRAVENOUS | Status: DC | PRN
  Administered 2018-08-20: 200 mg via INTRAVENOUS

## 2018-08-20 MED ORDER — FENTANYL CITRATE (PF) 100 MCG/2ML IJ SOLN: 25.0000 ug | INTRAMUSCULAR | Status: DC | PRN

## 2018-08-20 MED ORDER — ALBUMIN HUMAN 5 % IV SOLN
12.5000 g | Freq: Once | INTRAVENOUS | Status: AC
  Administered 2018-08-20: 13:00:00 12.5 g via INTRAVENOUS

## 2018-08-20 MED ORDER — FENTANYL CITRATE (PF) 250 MCG/5ML IJ SOLN
INTRAMUSCULAR | Status: AC
  Filled 2018-08-20: qty 5

## 2018-08-20 MED ORDER — ENOXAPARIN SODIUM 40 MG/0.4ML ~~LOC~~ SOLN
40.0000 mg | SUBCUTANEOUS | Status: DC
  Administered 2018-08-21: 40 mg via SUBCUTANEOUS
  Filled 2018-08-20 (×2): qty 0.4

## 2018-08-20 MED ORDER — OXYCODONE HCL 5 MG PO TABS: 5.0000 mg | ORAL_TABLET | Freq: Once | ORAL | Status: DC | PRN

## 2018-08-20 MED ORDER — HYDROMORPHONE HCL 1 MG/ML IJ SOLN
INTRAMUSCULAR | Status: DC | PRN
  Administered 2018-08-20 (×2): .25 mg via INTRAVENOUS

## 2018-08-20 MED ORDER — MORPHINE SULFATE (PF) 2 MG/ML IV SOLN: 1.0000 mg | INTRAVENOUS | Status: DC | PRN

## 2018-08-20 MED ORDER — HYDROMORPHONE HCL 1 MG/ML IJ SOLN
INTRAMUSCULAR | Status: AC
  Filled 2018-08-20: qty 0.5

## 2018-08-20 MED ORDER — PROMETHAZINE HCL 25 MG/ML IJ SOLN: 6.2500 mg | INTRAMUSCULAR | Status: DC | PRN

## 2018-08-20 MED ORDER — INSULIN ASPART 100 UNIT/ML ~~LOC~~ SOLN
17.0000 [IU] | Freq: Once | SUBCUTANEOUS | Status: AC
  Administered 2018-08-20: 08:00:00 17 [IU] via SUBCUTANEOUS

## 2018-08-20 MED ORDER — VANCOMYCIN HCL 1000 MG IV SOLR
INTRAVENOUS | Status: AC
  Filled 2018-08-20: qty 1000

## 2018-08-20 MED ORDER — CEFAZOLIN SODIUM-DEXTROSE 2-4 GM/100ML-% IV SOLN
2.0000 g | Freq: Once | INTRAVENOUS | Status: AC
  Administered 2018-08-20: 2 g via INTRAVENOUS

## 2018-08-20 MED ORDER — MIDAZOLAM HCL 5 MG/5ML IJ SOLN
INTRAMUSCULAR | Status: DC | PRN
  Administered 2018-08-20: 2 mg via INTRAVENOUS

## 2018-08-20 SURGICAL SUPPLY — 47 items
BIT DRILL CANN LRG QC 5X300 (BIT) ×1 IMPLANT
BIT DRILL FLUTED FEMUR 4.2/3 (BIT) ×1 IMPLANT
BLADE HELICAL TFNA 90 (Anchor) ×1 IMPLANT
BRUSH SCRUB SURG 4.25 DISP (MISCELLANEOUS) ×4 IMPLANT
CHLORAPREP W/TINT 26ML (MISCELLANEOUS) ×2 IMPLANT
COVER PERINEAL POST (MISCELLANEOUS) ×2 IMPLANT
COVER SURGICAL LIGHT HANDLE (MISCELLANEOUS) ×2 IMPLANT
COVER WAND RF STERILE (DRAPES) IMPLANT
DERMABOND ADVANCED (GAUZE/BANDAGES/DRESSINGS) ×1
DERMABOND ADVANCED .7 DNX12 (GAUZE/BANDAGES/DRESSINGS) ×1 IMPLANT
DRAPE C-ARM 35X43 STRL (DRAPES) ×2 IMPLANT
DRAPE C-ARMOR (DRAPES) ×1 IMPLANT
DRAPE IMP U-DRAPE 54X76 (DRAPES) ×4 IMPLANT
DRAPE INCISE IOBAN 66X45 STRL (DRAPES) ×2 IMPLANT
DRAPE STERI IOBAN 125X83 (DRAPES) ×2 IMPLANT
DRAPE SURG 17X23 STRL (DRAPES) ×4 IMPLANT
DRAPE U-SHAPE 47X51 STRL (DRAPES) ×2 IMPLANT
DRSG MEPILEX BORDER 4X4 (GAUZE/BANDAGES/DRESSINGS) ×2 IMPLANT
DRSG MEPILEX BORDER 4X8 (GAUZE/BANDAGES/DRESSINGS) ×2 IMPLANT
ELECT REM PT RETURN 9FT ADLT (ELECTROSURGICAL) ×2
ELECTRODE REM PT RTRN 9FT ADLT (ELECTROSURGICAL) ×1 IMPLANT
GLOVE BIO SURGEON STRL SZ 6.5 (GLOVE) ×6 IMPLANT
GLOVE BIO SURGEON STRL SZ7.5 (GLOVE) ×8 IMPLANT
GLOVE BIOGEL PI IND STRL 6.5 (GLOVE) ×1 IMPLANT
GLOVE BIOGEL PI IND STRL 7.5 (GLOVE) ×1 IMPLANT
GLOVE BIOGEL PI INDICATOR 6.5 (GLOVE) ×1
GLOVE BIOGEL PI INDICATOR 7.5 (GLOVE) ×1
GOWN STRL REUS W/ TWL LRG LVL3 (GOWN DISPOSABLE) ×1 IMPLANT
GOWN STRL REUS W/TWL LRG LVL3 (GOWN DISPOSABLE) ×1
GUIDEWIRE 3.2X400 (WIRE) ×2 IMPLANT
GUIDEWIRE THREADED 2.8 (WIRE) ×2 IMPLANT
KIT BASIN OR (CUSTOM PROCEDURE TRAY) ×2 IMPLANT
KIT TURNOVER KIT B (KITS) ×2 IMPLANT
MANIFOLD NEPTUNE II (INSTRUMENTS) ×2 IMPLANT
NAIL CANN TFNA 9MM/130 DEG TI (Nail) ×1 IMPLANT
NS IRRIG 1000ML POUR BTL (IV SOLUTION) ×2 IMPLANT
PACK GENERAL/GYN (CUSTOM PROCEDURE TRAY) ×2 IMPLANT
PAD ARMBOARD 7.5X6 YLW CONV (MISCELLANEOUS) ×4 IMPLANT
SCREW CANN THRD SD 8.2X80X32 (Screw) ×2 IMPLANT
SCREW LOCK STAR 5X36 (Screw) ×1 IMPLANT
SUT MNCRL AB 3-0 PS2 18 (SUTURE) ×3 IMPLANT
SUT VIC AB 0 CT1 27 (SUTURE) ×1
SUT VIC AB 0 CT1 27XBRD ANBCTR (SUTURE) IMPLANT
SUT VIC AB 2-0 CT1 27 (SUTURE) ×3
SUT VIC AB 2-0 CT1 TAPERPNT 27 (SUTURE) ×2 IMPLANT
TOWEL OR 17X26 10 PK STRL BLUE (TOWEL DISPOSABLE) ×4 IMPLANT
WATER STERILE IRR 1000ML POUR (IV SOLUTION) ×2 IMPLANT

## 2018-08-20 NOTE — Progress Notes (Signed)
Patient ID: [name], female   DOB: December 16, 1955, 63 y.o.   MRN: [id number]  PROGRESS NOTE    [name]  TGY:[id number] DOB: 1955/08/15 DOA: 08/19/2018 PCP: [name], MD   Brief Narrative:  63 year old female with history of diabetes mellitus type 2 on insulin, hypertension, hyperlipidemia, unspecified congestive heart failure, peripheral arterial disease status post right carotid endarterectomy, CAD status post CABG, bilateral BKA, osteomyelitis status post multiple surgeries with ultimate resolution with bilateral BKA's, seizures presented on 08/19/2018 with left hip pain since Mother's Day after falling onto her left side.  She was found to have left hip fracture.  Orthopedics was consulted.  Assessment & Plan:   Principal Problem:   Closed intertrochanteric fracture of hip, left, initial encounter [facility]) Active Problems:   Type 2 diabetes mellitus with diabetic mononeuropathy, with long-term current use of insulin (HCC)   Hyperlipidemia, unspecified   Anemia, normocytic normochromic   ANXIETY DEPRESSION   Essential hypertension   CAD, ARTERY BYPASS GRAFT   PVD (peripheral vascular disease) (HCC)   Phantom limb pain (HCC)  Closed intertrochanteric left hip fracture secondary to mechanical fall -Orthopedics following.  Status post surgical intervention today.  Follow further orthopedic recommendations. -Follow cultures. -PT/OT evaluation  -Pain management  Diabetes mellitus type 2 uncontrolled with hyperglycemia with neuropathy with long-term use of insulin -Blood sugars still elevated.  Continue long-acting insulin along with CBGs with SSI.  Hypertension -Blood pressure stable.  Continue amlodipine, Coreg, irbesartan and Lasix.  History of peripheral vascular disease status post bilateral BKA and right carotid endarterectomy -Outpatient follow-up  Hyperlipidemia -Continue home regimen  Anemia of chronic disease most likely from chronic kidney  disease--hemoglobin stable.  Monitor  Chronic renal disease stage III--creatinine stable.  Monitor  CAD status post CABG--stable.  Continue aspirin, statin, Coreg and irbesartan.  Outpatient follow-up with cardiology  Unspecified congestive heart failure  -Currently compensated.  Echo in January 2020 showed EF of more than 55%.  Continue Lasix, Coreg.  Outpatient follow-up with cardiology   DVT prophylaxis: Lovenox Code Status: Full Family Communication: None at bedside Disposition Plan: Depends on clinical outcome.  Might need rehab placement  Consultants: Orthopedics  Procedures: None  Antimicrobials:  None  Subjective: Patient seen and examined at bedside.  She is sleepy, hardly wakes up on calling her name.  No overnight fever or vomiting reported.  Objective: Vitals:   08/19/18 1542 08/19/18 2010 08/20/18 0340 08/20/18 0830  BP:  134/60 129/63 (!) 163/52  Pulse:  81 84 87  Resp:  16 15   Temp:  98.7 F (37.1 C) 100 F (37.8 C)   TempSrc:  Oral Oral   SpO2:  97% 96% 94%  Weight: 88.5 kg     Height: 5\' 5"  (1.651 m)       Intake/Output Summary (Last 24 hours) at 08/20/2018 1050 Last data filed at 08/20/2018 0600 Gross per 24 hour  Intake 620 ml  Output 1001 ml  Net -381 ml   Filed Weights   08/19/18 1542  Weight: 88.5 kg    Examination:  General exam: Appears calm and comfortable.  No distress.  Sleepy Respiratory system: Bilateral decreased breath sounds at bases, scattered crackles cardiovascular system: S1 & S2 heard, Rate controlled Gastrointestinal system: Abdomen is nondistended, soft and nontender. Normal bowel sounds heard. Extremities: No cyanosis, clubbing; bilateral BKA.    Data Reviewed: I have personally reviewed following labs and imaging studies  CBC: Recent Labs  Lab 08/19/18 1150 08/20/18 0459  WBC 5.3 5.7  NEUTROABS 3.9  --   HGB 9.2* 8.9*  HCT 28.7* 27.8*  MCV 88.0 87.7  PLT 221 956   Basic Metabolic Panel: Recent Labs  Lab  08/19/18 1150 08/20/18 0459  NA 134* 135  K 3.9 3.6  CL 91* 93*  CO2 29 30  GLUCOSE 378* 258*  BUN 30* 26*  CREATININE 1.49* 1.53*  CALCIUM 9.3 8.9   GFR: Estimated Creatinine Clearance: 42.4 mL/min (A) (by C-G formula based on SCr of 1.53 mg/dL (H)). Liver Function Tests: No results for input(s): AST, ALT, ALKPHOS, BILITOT, PROT, ALBUMIN in the last 168 hours. No results for input(s): LIPASE, AMYLASE in the last 168 hours. No results for input(s): AMMONIA in the last 168 hours. Coagulation Profile: Recent Labs  Lab 08/19/18 1150  INR 1.2   Cardiac Enzymes: No results for input(s): CKTOTAL, CKMB, CKMBINDEX, TROPONINI in the last 168 hours. BNP (last 3 results) No results for input(s): PROBNP in the last 8760 hours. HbA1C: Recent Labs    08/19/18 1413  HGBA1C 8.5*   CBG: Recent Labs  Lab 08/19/18 2044 08/20/18 0756 08/20/18 0833 08/20/18 0908 08/20/18 1026  GLUCAP 225* 443* 401* 390* 309*   Lipid Profile: No results for input(s): CHOL, HDL, LDLCALC, TRIG, CHOLHDL, LDLDIRECT in the last 72 hours. Thyroid Function Tests: No results for input(s): TSH, T4TOTAL, FREET4, T3FREE, THYROIDAB in the last 72 hours. Anemia Panel: No results for input(s): VITAMINB12, FOLATE, FERRITIN, TIBC, IRON, RETICCTPCT in the last 72 hours. Sepsis Labs: No results for input(s): PROCALCITON, LATICACIDVEN in the last 168 hours.  Recent Results (from the past 240 hour(s))  SARS Coronavirus 2 (CEPHEID - Performed in [facility] hospital lab), Hosp Order     Status: None   Collection Time: 08/19/18 10:57 AM  Result Value Ref Range Status   SARS Coronavirus 2 NEGATIVE NEGATIVE Final    Comment: (NOTE) If result is NEGATIVE SARS-CoV-2 target nucleic acids are NOT DETECTED. The SARS-CoV-2 RNA is generally detectable in upper and lower  respiratory specimens during the acute phase of infection. The lowest  concentration of SARS-CoV-2 viral copies this assay can detect is 250  copies /  mL. A negative result does not preclude SARS-CoV-2 infection  and should not be used as the sole basis for treatment or other  patient management decisions.  A negative result may occur with  improper specimen collection / handling, submission of specimen other  than nasopharyngeal swab, presence of viral mutation(s) within the  areas targeted by this assay, and inadequate number of viral copies  (<250 copies / mL). A negative result must be combined with clinical  observations, patient history, and epidemiological information. If result is POSITIVE SARS-CoV-2 target nucleic acids are DETECTED. The SARS-CoV-2 RNA is generally detectable in upper and lower  respiratory specimens dur ing the acute phase of infection.  Positive  results are indicative of active infection with SARS-CoV-2.  Clinical  correlation with patient history and other diagnostic information is  necessary to determine patient infection status.  Positive results do  not rule out bacterial infection or co-infection with other viruses. If result is PRESUMPTIVE POSTIVE SARS-CoV-2 nucleic acids MAY BE PRESENT.   A presumptive positive result was obtained on the submitted specimen  and confirmed on repeat testing.  While 2019 novel coronavirus  (SARS-CoV-2) nucleic acids may be present in the submitted sample  additional confirmatory testing may be necessary for epidemiological  and / or clinical management purposes  to differentiate between  SARS-CoV-2 and other Sarbecovirus currently known to infect humans.  If clinically indicated additional testing with an alternate test  methodology [phone]) is advised. The SARS-CoV-2 RNA is generally  detectable in upper and lower respiratory sp ecimens during the acute  phase of infection. The expected result is Negative. Fact Sheet for Patients:  StrictlyIdeas.no Fact Sheet for Healthcare Providers: BankingDealers.co.za This test is  not yet approved or cleared by the [place] FDA and has been authorized for detection and/or diagnosis of SARS-CoV-2 by FDA under an Emergency Use Authorization (EUA).  This EUA will remain in effect (meaning this test can be used) for the duration of the COVID-19 declaration under Section 564(b)(1) of the Act, 21 U.S.C. section 360bbb-3(b)(1), unless the authorization is terminated or revoked sooner. Performed at [facility], [place] [address] [place], [place] [address]   Surgical pcr screen     Status: Abnormal   Collection Time: 08/19/18  3:40 PM  Result Value Ref Range Status   MRSA, PCR NEGATIVE NEGATIVE Final   Staphylococcus aureus POSITIVE (A) NEGATIVE Final    Comment: (NOTE) The Xpert SA Assay (FDA approved for NASAL specimens in patients 50 years of age and older), is one component of a comprehensive surveillance program. It is not intended to diagnose infection nor to guide or monitor treatment. Performed at [facility], [place] [address] [place], [place] [address]          Radiology Studies: Ct Hip Left Wo Contrast  Result Date: 08/19/2018 CLINICAL DATA:  Evaluate left hip fracture.  Patient fell 07/25/2018 EXAM: CT OF THE LEFT HIP WITHOUT CONTRAST TECHNIQUE: Multidetector CT imaging of the left hip was performed according to the standard protocol. Multiplanar CT image reconstructions were also generated. COMPARISON:  Radiograph 08/16/1999 FINDINGS: Comminuted intertrochanteric fracture of the left hip appears subacute with bony resorptive changes and areas of attempted healing/new bone formation around the fracture site. The femoral head is normally located. No acetabular fractures. The visualized left hemipelvis is intact. Moderate vascular calcifications are noted. No significant soft tissue abnormalities. No significant intrapelvic abnormalities. IMPRESSION: 1. Subacute comminuted intertrochanteric fracture of the left hip with bony resorptive changes  and areas of attempted healing/new bone formation. 2. No pelvic fractures. Electronically Signed   By: Marijo Sanes M.D.   On: 08/19/2018 20:26   Dg Knee Complete 4 Views Left  Result Date: 08/19/2018 CLINICAL DATA:  Left knee pain after fall 3 days ago. EXAM: LEFT KNEE - COMPLETE 4+ VIEW COMPARISON:  Radiographs of August 16, 2018. FINDINGS: Status post below-knee amputation of left knee. Multiple stents are noted in the distal left superficial femoral artery. No fracture or dislocation is noted. No lytic destruction is seen to suggest osteomyelitis. IMPRESSION: Status post below-knee amputation of left knee. No acute abnormality is noted. Electronically Signed   By: Marijo Conception M.D.   On: 08/19/2018 11:49        Scheduled Meds: . [MAR Hold] amLODipine  5 mg Oral BID  . [MAR Hold] aspirin EC  81 mg Oral Daily  . [MAR Hold] atorvastatin  80 mg Oral q1800  . [MAR Hold] carvedilol  25 mg Oral Daily  . [MAR Hold] chlorhexidine  60 mL Topical Once  . [MAR Hold] Chlorhexidine Gluconate Cloth  6 each Topical Daily  . [MAR Hold] enoxaparin (LOVENOX) injection  40 mg Subcutaneous Q24H  . [MAR Hold] feeding supplement  296 mL Oral Once  . [MAR Hold] feeding supplement ([place])  237 mL Oral BID BM  . [MAR Hold] furosemide  40 mg Oral Daily  . [MAR Hold] insulin aspart  0-15 Units Subcutaneous TID WC  . [MAR Hold] insulin aspart  0-5 Units Subcutaneous QHS  . [MAR Hold] insulin aspart  3 Units Subcutaneous TID WC  . insulin aspart  8 Units Subcutaneous Once  . [MAR Hold] insulin glargine  70 Units Subcutaneous Daily  . [MAR Hold] irbesartan  300 mg Oral Daily  . [MAR Hold] levETIRAcetam  1,500 mg Oral QHS  . [MAR Hold] lidocaine  1 patch Transdermal Q24H  . [MAR Hold] multivitamin with minerals  1 tablet Oral Daily  . [MAR Hold] mupirocin ointment  1 application Nasal BID  . [MAR Hold] sertraline  100 mg Oral BID   Continuous Infusions: . lactated ringers 10 mL/hr at 08/20/18 0836   . [MAR Hold] methocarbamol (ROBAXIN) IV       LOS: 1 day        Aline August, MD [facility] 08/20/2018, 10:50 AM

## 2018-08-20 NOTE — Progress Notes (Signed)
Report was given to [facility] in [place] [facility].  Patient will be covered for the blood glucose of 443 with 17 units of Novolog.

## 2018-08-20 NOTE — Op Note (Signed)
Orthopaedic Surgery Operative Note (CSN: [id number] ) Date of Surgery: 08/20/2018  Admit Date: 08/19/2018   Diagnoses: Pre-Op Diagnoses: Left subacute 3-part intertrochanteric femur fracture   Post-Op Diagnosis: Same  Procedures: 1. CPT 27245-Cephalomedullary nailing of left intertrochanteric femur fracture 2. CPT 27235-Percutaneous fixation of left femoral neck 3. CPT 20650-Placement and removal of left distal femoral traction pin  Surgeons : Primary: Haddix, Thomasene Lot, MD  Assistant: Patrecia Pace, PA-C  Location: OR 3   Anesthesia: General  Antibiotics: Ancef 2g preop   Tourniquet time: None  Estimated Blood URKY:706 mL  Complications:None   Specimens:None  Implants: Implant Name Type Inv. Item Serial No. Manufacturer Lot No. LRB No. Used  NAIL CANN TFNA 9MM/130 DEG TI - [id number] Nail NAIL CANN TFNA 9MM/130 DEG TI  SYNTHES TRAUMA [id number] Left 1  BLADE HELICAL TFNA 90 - [id number] Anchor BLADE HELICAL TFNA 34  DEPUY ORTHOPAEDICS  Left 1  SCREW LOCK STAR 5X36 - [id number] Screw SCREW LOCK STAR 5X36  SYNTHES TRAUMA  Left 1  59mm Cannulated Screw    DEPUY SYNTHES  Left 1     Indications for Surgery: 63 year old female with history of diabetes type 2 on insulin, hypertension, hyperlipidemia, congestive heart failure, peripheral artery disease and coronary artery disease with a bilateral BKA's.  The patient fell approximately 3 weeks ago and had left hip pain.  She did not seek medical care until early this week where x-rays showed a displaced intertrochanteric femur fracture.  There was significant callus on x-rays as well as on the CT scan.  Due to the displacement and the fact that she was previously ambulatory with her prosthesis I recommended proceeding with cephalo-medullary nailing of her left femur.  Risks and benefits were discussed with the patient risks included but not limited to bleeding, infection, malunion, nonunion, hardware failure, cut out of the prosthesis, chronic  pain, nerve and blood vessel injury, DVT even the possibility of death.  Patient agreed to proceed with surgery and consent was obtained.  Operative Findings: 1.  Subacute three-part left intertrochanteric femur fracture with significant callus formation and flexion and varus angulation of the head neck segment 2.  Percutaneous fixation of left femoral neck fracture using Synthes 6.5 mm titanium cannulated screws in the anterior portion of the neck 3.  Cephalo-medullary nailing of left intertrochanteric femur fracture using Synthes short TFN 170 x 9 mm with a 90 mm helical blade.  Procedure: The patient was identified in the preoperative holding area. Consent was confirmed with the patient and their family and all questions were answered. The operative extremity was marked after confirmation with the patient. she was then brought back to the operating room by our anesthesia colleagues.  She was placed under general anesthetic and carefully transferred over to a radiolucent flat top table.  A bump was placed under her operative hip. The operative extremity was then prepped and draped in usual sterile fashion. A preoperative timeout was performed to verify the patient, the procedure, and the extremity. Preoperative antibiotics were dosed.  Fluoroscopic images showed significant displacement of the intertrochanteric femur fracture with the basicervical component flexed and displaced significantly.  A distal femoral traction pin was placed from medial to lateral.  It was connected to a traction bow and traction was used to manipulate the leg as she did not have a ankle or foot.  A percutaneous incision was made at the location of her lesser trochanter.  Was carried down through skin and subcutaneous tissue through the IT band.  A Cobb elevator was used to enter the fracture site to debride and loosen some of the callus that had formed.  A bone hook was then used to go across the anterior surface of the  intertrochanteric region to the lesser trochanter.  A lateral force in a posterior force was directed at the head neck segment to improve reduction.  Manipulation was performed through the traction bow and a decent reduction was able to be obtained.  To hold this reduction while the cephalo-medullary device was being placed 2.8 mm guide pins for the cannulated screws were placed through the for femur shaft into the anterior portion of the femoral neck directed on AP and lateral fluoroscopic imaging.  6.5 mm titanium screws were placed proximally and distally along the anterior neck to hold the provisional reduction.  A small percutaneous incision was made proximal to the greater trochanter and the hip abductors were split in line with the incision a threaded guidepin was used to find the starting point and entered the medullary canal.  An entry reamer was then used to enter the canal.  The width of the canal was measured and a 9 mm nail was then placed into the femur.  The distal cannulated screw was in the way of the cephalo-medullary device so this was removed and a bone hook was used to reinforce the reduction.  A threaded guidepin was placed through the lateral incision into the head neck segment for the helical blade.  It was measured and a 90 mm helical blade was placed.  It was statically locked.  The proximal cannulated screw was then tightened further which had excellent purchase.  AP and lateral fluoroscopic imaging confirmed adequate placement of the helical blade.  A percutaneous incision was made distally and a distal interlock was placed through the outrigger jig.  All instruments were then removed.  The jig was removed from the nail.  Final fluoroscopic imaging was obtained.  Adequate tip apex distance was maintained.  The incision was then copiously irrigated.  A gram of vancomycin powder was placed into the incisions.  A layered closure of 0 Vicryl 2-0 Vicryl and 3-0 Monocryl was used.   Dermabond was used to seal the skin.  Sterile dressings were placed.  The patient was then awoken from anesthesia and taken to the PACU in stable condition.  Post Op Plan/Instructions: The patient will be weightbearing as tolerated to the left lower extremity.  She will receive postoperative Ancef.  I will recommend Plavix and aspirin for DVT prophylaxis.  She will mobilize with physical therapy.  I was present and performed the entire surgery.  Patrecia Pace, PA-C did assist me throughout the case. An assistant was necessary given the difficulty in approach, maintenance of reduction and ability to instrument the fracture.   Katha Hamming, MD [facility]

## 2018-08-20 NOTE — Progress Notes (Signed)
Blood sugar 390. Notified Dr. Daiva Huge. No new orders.

## 2018-08-20 NOTE — Progress Notes (Signed)
Inpatient Diabetes Program Recommendations  AACE/ADA: New Consensus Statement on Inpatient Glycemic Control (2015)  Target Ranges:  Prepandial:   less than 140 mg/dL      Peak postprandial:   less than 180 mg/dL (1-2 hours)      Critically ill patients:  140 - 180 mg/dL   Results for HALSTON, FAIRCLOUGH (MRN [id number]) as of 08/20/2018 08:57  Ref. Range 08/19/2018 17:19 08/19/2018 20:44 08/20/2018 07:56 08/20/2018 08:33  Glucose-Capillary Latest Ref Range: 70 - 99 mg/dL 295 (H) 225 (H) 443 (H) 401 (H)   Review of Glycemic Control  Diabetes history: DM 2 Outpatient Diabetes medications: Lantus 70 units Current orders for Inpatient glycemic control: Lantus 70, Nov 0-15 tid, Novolog 0-5 units qhs, Novolog 3 units tid meal coverage  A1c 8.5% on 6/4  Inpatient Diabetes Program Recommendations:    Noted home dose Lantus ordered for Daily, however, patient did NOT received Lantus yesterday. Glucose this am prior to surgery was 443. Novolog 17 units given. Do not have a recheck at this time.  Please give Lantus this am as soon as possible.  Thanks,  Tama Headings RN, MSN, BC-ADM Inpatient Diabetes Coordinator Team Pager [phone] (8a-5p)

## 2018-08-20 NOTE — Transfer of Care (Signed)
Immediate Anesthesia Transfer of Care Note  Patient: [name]  Procedure(s) Performed: INTRAMEDULLARY (IM) NAIL INTERTROCHANTRIC (Left Leg Upper)  Patient Location: PACU  Anesthesia Type:General  Level of Consciousness: awake, alert , oriented and patient cooperative  Airway & Oxygen Therapy: Patient Spontanous Breathing and Patient connected to face mask oxygen  Post-op Assessment: Report given to RN and Post -op Vital signs reviewed and stable  Post vital signs: Reviewed and stable  Last Vitals:  Vitals Value Taken Time  BP 112/58 08/20/2018 12:19 PM  Temp    Pulse 80 08/20/2018 12:21 PM  Resp 12 08/20/2018 12:21 PM  SpO2 100 % 08/20/2018 12:21 PM  Vitals shown include unvalidated device data.  Last Pain:  Vitals:   08/20/18 0340  TempSrc: Oral  PainSc:       Patients Stated Pain Goal: 3 (42/59/56 [id number])  Complications: No apparent anesthesia complications

## 2018-08-20 NOTE — Anesthesia Postprocedure Evaluation (Signed)
Anesthesia Post Note  Patient: [name]  Procedure(s) Performed: INTRAMEDULLARY (IM) NAIL INTERTROCHANTRIC (Left Leg Upper)     Patient location during evaluation: PACU Anesthesia Type: General Level of consciousness: awake and alert Pain management: pain level controlled Vital Signs Assessment: post-procedure vital signs reviewed and stable Respiratory status: spontaneous breathing, nonlabored ventilation and respiratory function stable Cardiovascular status: blood pressure returned to baseline and stable Postop Assessment: no apparent nausea or vomiting Anesthetic complications: no    Last Vitals:  Vitals:   08/20/18 1310 08/20/18 1312  BP: 95/63 (!) 104/52  Pulse: 78 79  Resp: 14 11  Temp:    SpO2: 100% 100%    Last Pain:  Vitals:   08/20/18 1245  TempSrc:   PainSc: 0-No pain                 Brennan Bailey

## 2018-08-20 NOTE — Anesthesia Procedure Notes (Signed)
Procedure Name: Intubation Date/Time: 08/20/2018 9:49 AM Performed by: Cleda Daub, CRNA Pre-anesthesia Checklist: Patient identified, Emergency Drugs available, Suction available and Patient being monitored Patient Re-evaluated:Patient Re-evaluated prior to induction Oxygen Delivery Method: Circle system utilized Preoxygenation: Pre-oxygenation with 100% oxygen Induction Type: IV induction Ventilation: Mask ventilation without difficulty and Mask ventilation throughout procedure Laryngoscope Size: Mac and 3 Grade View: Grade I Tube size: 7.0 mm Number of attempts: 1 Airway Equipment and Method: Stylet Placement Confirmation: ETT inserted through vocal cords under direct vision,  positive ETCO2 and breath sounds checked- equal and bilateral Secured at: 20 cm Tube secured with: Tape Dental Injury: Teeth and Oropharynx as per pre-operative assessment

## 2018-08-20 NOTE — Plan of Care (Signed)
  Problem: Pain Managment: Goal: General experience of comfort will improve Outcome: Progressing   Problem: Safety: Goal: Ability to remain free from injury will improve Outcome: Progressing   

## 2018-08-21 DIAGNOSIS — D649 Anemia, unspecified: Secondary | ICD-10-CM

## 2018-08-21 LAB — CBC WITH DIFFERENTIAL/PLATELET
Abs Immature Granulocytes: 0.03 10*3/uL (ref 0.00–0.07)
Basophils Absolute: 0 10*3/uL (ref 0.0–0.1)
Basophils Relative: 0 %
Eosinophils Absolute: 0 10*3/uL (ref 0.0–0.5)
Eosinophils Relative: 0 %
HCT: 23.3 % — ABNORMAL LOW (ref 36.0–46.0)
Hemoglobin: 7.3 g/dL — ABNORMAL LOW (ref 12.0–15.0)
Immature Granulocytes: 0 %
Lymphocytes Relative: 8 %
Lymphs Abs: 0.7 10*3/uL (ref 0.7–4.0)
MCH: 27.7 pg (ref 26.0–34.0)
MCHC: 31.3 g/dL (ref 30.0–36.0)
MCV: 88.3 fL (ref 80.0–100.0)
Monocytes Absolute: 0.8 10*3/uL (ref 0.1–1.0)
Monocytes Relative: 9 %
Neutro Abs: 6.8 10*3/uL (ref 1.7–7.7)
Neutrophils Relative %: 83 %
Platelets: 219 10*3/uL (ref 150–400)
RBC: 2.64 MIL/uL — ABNORMAL LOW (ref 3.87–5.11)
RDW: 16.3 % — ABNORMAL HIGH (ref 11.5–15.5)
WBC: 8.3 10*3/uL (ref 4.0–10.5)
nRBC: 0 % (ref 0.0–0.2)

## 2018-08-21 LAB — GLUCOSE, CAPILLARY
Glucose-Capillary: 334 mg/dL — ABNORMAL HIGH (ref 70–99)
Glucose-Capillary: 324 mg/dL — ABNORMAL HIGH (ref 70–99)
Glucose-Capillary: 332 mg/dL — ABNORMAL HIGH (ref 70–99)
Glucose-Capillary: 181 mg/dL — ABNORMAL HIGH (ref 70–99)
Glucose-Capillary: 225 mg/dL — ABNORMAL HIGH (ref 70–99)

## 2018-08-21 LAB — BASIC METABOLIC PANEL
Anion gap: 14 (ref 5–15)
BUN: 27 mg/dL — ABNORMAL HIGH (ref 8–23)
CO2: 27 mmol/L (ref 22–32)
Calcium: 8.5 mg/dL — ABNORMAL LOW (ref 8.9–10.3)
Chloride: 94 mmol/L — ABNORMAL LOW (ref 98–111)
Creatinine, Ser: 1.9 mg/dL — ABNORMAL HIGH (ref 0.44–1.00)
GFR calc Af Amer: 32 mL/min — ABNORMAL LOW (ref >60)
GFR calc non Af Amer: 28 mL/min — ABNORMAL LOW (ref >60)
Glucose, Bld: 229 mg/dL — ABNORMAL HIGH (ref 70–99)
Potassium: 3.7 mmol/L (ref 3.5–5.1)
Sodium: 135 mmol/L (ref 135–145)

## 2018-08-21 LAB — MAGNESIUM: Magnesium: 1.5 mg/dL — ABNORMAL LOW (ref 1.7–2.4)

## 2018-08-21 MED ORDER — SODIUM CHLORIDE 0.9 % IV SOLN
INTRAVENOUS | Status: DC
  Administered 2018-08-21 (×2): via INTRAVENOUS

## 2018-08-21 NOTE — Progress Notes (Signed)
1050 Pt called, c/o light-headed, does not feel well. BP 88/64 P- 95. Dr Starla Link notified. IVF restarted. Will cont to monitor.

## 2018-08-21 NOTE — Evaluation (Signed)
Physical Therapy Evaluation Patient Details Name: [name] MRN: [id number] DOB: Nov 13, 1955 Today's Date: 08/21/2018   History of Present Illness  Pt presents with L hip fx in the setting of previous R and L BKA. Pt underwent L IM nail.   Clinical Impression  Pt admitted with above diagnosis. Pt currently with functional limitations due to the deficits listed below (see PT Problem List). Pt requiring max A for rolling in bed as well as Sup<>sit. Needed mod A to sit EOB today, insufficient balance to attempt donning prostheses to transfer. Pt very avoidant of any wt on L hip in sitting. Pt soiled of stool and unaware upon PT arrival. Recommending SNF at d/c.  Pt will benefit from skilled PT to increase their independence and safety with mobility to allow discharge to the venue listed below.      Follow Up Recommendations SNF;Supervision/Assistance - 24 hour    Equipment Recommendations  None recommended by PT    Recommendations for Other Services OT consult     Precautions / Restrictions Precautions Precautions: Fall Restrictions Weight Bearing Restrictions: Yes LLE Weight Bearing: Weight bearing as tolerated      Mobility  Bed Mobility Overal bed mobility: Needs Assistance Bed Mobility: Supine to Sit;Sit to Supine;Rolling Rolling: Max assist   Supine to sit: Max assist Sit to supine: Max assist   General bed mobility comments: pt soiled of stool and did not know it upon PT arrival. Performed many rolls with mod A to R and max A to L with increased pain. Max A for R SL to sit as well as return to supine  Transfers                 General transfer comment: did not attempt today  Ambulation/Gait             General Gait Details: did not attempt today  Stairs            Wheelchair Mobility    Modified Rankin (Stroke Patients Only)       Balance Overall balance assessment: Needs assistance;History of Falls Sitting-balance support: Bilateral  upper extremity supported Sitting balance-Leahy Scale: Poor Sitting balance - Comments: required mod A to maintain sitting EOB due to heavy R lean Postural control: Right lateral lean     Standing balance comment: prostheses not donned today                             Pertinent Vitals/Pain Pain Assessment: Faces Faces Pain Scale: Hurts whole lot Pain Location: L hip Pain Descriptors / Indicators: Grimacing;Operative site guarding;Aching Pain Intervention(s): Limited activity within patient's tolerance;Monitored during session    [place] expects to be discharged to:: Private residence Living Arrangements: Spouse/significant other;Children Available Help at Discharge: Family;Available 24 hours/day Type of Home: House Home Access: Ramped entrance     Home Layout: One level Home Equipment: Walker - 2 wheels;Wheelchair - [employer];Shower seat      Prior Function Level of Independence: Needs assistance   Gait / Transfers Assistance Needed: difficult to accurately assess as pt seems mildly confused and timeline of history seems off. She states that she wants to walk again but also that she was walking with B prostheses  ADL's / Homemaking Assistance Needed: states that her husband and kids help her with bathing and dressing        Hand Dominance        Extremity/Trunk Assessment  Upper Extremity Assessment Upper Extremity Assessment: Generalized weakness    Lower Extremity Assessment Lower Extremity Assessment: Generalized weakness    Cervical / Trunk Assessment Cervical / Trunk Assessment: Kyphotic  Communication   Communication: No difficulties  Cognition Arousal/Alertness: Awake/alert Behavior During Therapy: WFL for tasks assessed/performed Overall Cognitive Status: Impaired/Different from baseline Area of Impairment: Memory;Problem solving                     Memory: Decreased short-term memory        Problem Solving: Difficulty sequencing;Requires verbal cues;Requires tactile cues General Comments: appropriate but mildly confused. Needs specific instructions and tactile facilitation at times      General Comments General comments (skin integrity, edema, etc.): pt very avoidant of putting any wt towards L side    Exercises General Exercises - Lower Extremity Quad Sets: AROM;Left;10 reps;Supine   Assessment/Plan    PT Assessment Patient needs continued PT services  PT Problem List Decreased strength;Decreased activity tolerance;Decreased mobility;Decreased balance;Decreased cognition;Decreased knowledge of precautions;Pain       PT Treatment Interventions DME instruction;Gait training;Functional mobility training;Therapeutic activities;Therapeutic exercise;Balance training;Neuromuscular re-education;Cognitive remediation;Patient/family education    PT Goals (Current goals can be found in the Care Plan section)  Acute Rehab PT Goals Patient Stated Goal: return home PT Goal Formulation: With patient Time For Goal Achievement: 09/04/18 Potential to Achieve Goals: Fair    Frequency Min 3X/week   Barriers to discharge        Co-evaluation               AM-PAC PT "6 Clicks" Mobility  Outcome Measure Help needed turning from your back to your side while in a flat bed without using bedrails?: A Lot Help needed moving from lying on your back to sitting on the side of a flat bed without using bedrails?: A Lot Help needed moving to and from a bed to a chair (including a wheelchair)?: Total Help needed standing up from a chair using your arms (e.g., wheelchair or bedside chair)?: Total Help needed to walk in hospital room?: Total Help needed climbing 3-5 steps with a railing? : Total 6 Click Score: 8    End of Session Equipment Utilized During Treatment: Gait belt Activity Tolerance: Patient tolerated treatment well Patient left: in bed;with call bell/phone within  reach;with bed alarm set Nurse Communication: Mobility status PT Visit Diagnosis: Muscle weakness (generalized) (M62.81);Pain Pain - Right/Left: Left Pain - part of body: Hip    Time: 3491-7915 PT Time Calculation (min) (ACUTE ONLY): 35 min   Charges:   PT Evaluation $PT Eval Moderate Complexity: 1 Mod PT Treatments $Therapeutic Activity: 8-22 mins        Leighton Roach, PT  Acute Rehab Services  Pager [phone] Office [place] 08/21/2018, 4:20 PM

## 2018-08-21 NOTE — Progress Notes (Signed)
Pt noted with continued.. forgetfulness and occasional non related responses to questions. Follows commands at this time. Spoke to son "Chance" he said his Mom has this behavior sometimes after surgery.  Patient given Manus Rudd this am MD at the Bedside to speak with patient plan of continued care. L hip surgical site, with @ aquacel dsgs C/D/I. No prns for pain given overnight. Ice pack placed to the L hip. Bed exit alarm maintained, call light and phone in reach.

## 2018-08-21 NOTE — Plan of Care (Signed)
  Problem: Safety: Goal: Ability to remain free from injury will improve Outcome: Progressing   Problem: Pain Managment: Goal: General experience of comfort will improve Outcome: Progressing   Problem: Elimination: Goal: Will not experience complications related to bowel motility Outcome: Progressing   

## 2018-08-21 NOTE — Progress Notes (Signed)
SPORTS MEDICINE AND JOINT REPLACEMENT  Lara Mulch, MD    Carlyon Shadow, PA-C [address], [place], [place]  [address]                             [phone]   PROGRESS NOTE  Subjective:  negative for Chest Pain  negative for Shortness of Breath  negative for Nausea/Vomiting   negative for Calf Pain  negative for Bowel Movement   Tolerating Diet: yes         Patient reports pain as 4 on 0-10 scale.    Objective: Vital signs in last 24 hours:    Patient Vitals for the past 24 hrs:  BP Temp Temp src Pulse Resp SpO2  08/20/18 2058 (!) 148/61 99.1 F (37.3 C) Oral 97 - 96 %  08/20/18 1555 118/60 98.9 F (37.2 C) Oral - 16 93 %  08/20/18 1535 133/64 98 F (36.7 C) - 88 - 100 %  08/20/18 1534 - - - 88 (!) 9 100 %  08/20/18 1531 114/72 - - 89 (!) 9 100 %  08/20/18 1530 - - - 88 11 100 %  08/20/18 1528 (!) 124/59 - - 86 (!) 9 100 %  08/20/18 1526 129/64 - - 86 10 100 %  08/20/18 1521 132/61 - - 87 14 100 %  08/20/18 1516 (!) 129/56 - - 88 10 100 %  08/20/18 1515 - - - 87 (!) 8 100 %  08/20/18 1511 130/64 - - 87 10 100 %  08/20/18 1506 127/64 - - 87 10 100 %  08/20/18 1501 111/62 - - 85 10 100 %  08/20/18 1500 - - - 85 12 100 %  08/20/18 1456 112/60 - - 85 10 100 %  08/20/18 1451 129/61 - - 84 11 100 %  08/20/18 1446 97/62 - - 84 10 100 %  08/20/18 1445 - - - 83 12 100 %  08/20/18 1441 (!) 119/57 - - 83 11 99 %  08/20/18 1436 123/63 - - 83 (!) 9 100 %  08/20/18 1431 (!) 114/59 - - 83 10 100 %  08/20/18 1430 - - - 83 10 100 %  08/20/18 1426 99/62 - - 83 (!) 9 100 %  08/20/18 1421 119/67 - - 83 10 100 %  08/20/18 1416 116/60 - - 82 10 100 %  08/20/18 1415 - - - 82 - 100 %  08/20/18 1411 (!) 104/59 - - 82 10 100 %  08/20/18 1406 (!) 112/57 - - 82 10 100 %  08/20/18 1401 (!) 104/58 - - 81 11 100 %  08/20/18 1400 - - - 78 11 100 %  08/20/18 1356 (!) 104/56 - - 82 10 100 %  08/20/18 1350 107/60 - - 81 11 100 %  08/20/18 1347 - - - 79 12 100 %  08/20/18 1346  98/60 - - 80 (!) 9 100 %  08/20/18 1345 - - - 81 10 100 %  08/20/18 1341 (!) 94/54 - - 80 (!) 7 100 %  08/20/18 1339 105/63 - - 82 10 100 %  08/20/18 1331 (!) 124/55 - - 79 (!) 9 100 %  08/20/18 1330 - - - 81 14 100 %  08/20/18 1326 104/60 - - 80 11 100 %  08/20/18 1321 (!) 134/57 - - 76 (!) 8 100 %  08/20/18 1315 (!) 121/53 - - 77 (!) 9 100 %  08/20/18  1312 (!) 104/52 - - 79 11 100 %  08/20/18 1310 95/63 - - 78 14 100 %  08/20/18 1306 (!) 100/58 - - 79 13 99 %  08/20/18 1305 - - - 78 13 97 %  08/20/18 1304 119/61 - - 79 (!) 9 97 %  08/20/18 1301 (!) 69/53 - - 80 10 98 %  08/20/18 1300 (!) 78/52 - - 77 12 97 %  08/20/18 1256 - - - 79 13 97 %  08/20/18 1255 (!) 75/58 - - 77 (!) 8 94 %  08/20/18 1253 (!) 88/46 - - 79 10 96 %  08/20/18 1250 - - - 77 11 99 %  08/20/18 1249 (!) 66/41 - - 78 (!) 8 99 %  08/20/18 1247 (!) 70/51 - - 78 10 99 %  08/20/18 1245 - - - 79 (!) 7 97 %  08/20/18 1243 (!) 63/46 - - 74 12 95 %  08/20/18 1240 (!) 89/55 - - 78 11 95 %  08/20/18 1236 - - - 78 13 100 %  08/20/18 1235 (!) 96/50 - - 79 10 99 %  08/20/18 1233 (!) 68/51 - - 79 10 100 %  08/20/18 1230 - - - 78 10 99 %  08/20/18 1221 - - - 80 16 100 %  08/20/18 1219 (!) 112/58 (!) 97 F (36.1 C) - 79 13 100 %  08/20/18 0830 (!) 163/52 - - 87 - 94 %    @flow {1959:LAST@   Intake/Output from previous day:   06/05 0701 - 06/06 0700 In: 1530 [P.O.:180; I.V.:1250] Out: 550 [Urine:300]   Intake/Output this shift:   06/05 1901 - 06/06 0700 In: 160 [P.O.:60] Out: -    Intake/Output      06/05 0701 - 06/06 0700   P.O. 180   I.V. (mL/kg) 1250 (14.1)   IV Piggyback 100   Total Intake(mL/kg) 1530 (17.3)   Urine (mL/kg/hr) 300 (0.1)   Blood 250   Total Output 550   Net +980          LABORATORY DATA: Recent Labs    08/19/18 1150 08/20/18 0459 08/20/18 1559 08/21/18 0217  WBC 5.3 5.7 9.7 8.3  HGB 9.2* 8.9* 7.9* 7.3*  HCT 28.7* 27.8* 24.8* 23.3*  PLT 221 211 237 219   Recent Labs     08/19/18 1150 08/20/18 0459 08/21/18 0217  NA 134* 135 135  K 3.9 3.6 3.7  CL 91* 93* 94*  CO2 29 30 27   BUN 30* 26* 27*  CREATININE 1.49* 1.53* 1.90*  GLUCOSE 378* 258* 229*  CALCIUM 9.3 8.9 8.5*   Lab Results  Component Value Date   INR 1.2 08/19/2018   INR 1.28 08/21/2013   INR 1.20 06/17/2013    Examination:  General appearance: alert, cooperative and no distress  Wound Exam: clean, dry, intact   Drainage:  None: wound tissue dry    Assessment:    1 Day Post-Op  Procedure(s) (LRB): INTRAMEDULLARY (IM) NAIL INTERTROCHANTRIC (Left)  ADDITIONAL DIAGNOSIS:  Principal Problem:   Closed intertrochanteric fracture of hip, left, initial encounter ([facility]) Active Problems:   Type 2 diabetes mellitus with diabetic mononeuropathy, with long-term current use of insulin (HCC)   Hyperlipidemia, unspecified   Anemia, normocytic normochromic   ANXIETY DEPRESSION   Essential hypertension   CAD, ARTERY BYPASS GRAFT   PVD (peripheral vascular disease) (HCC)   Phantom limb pain (HCC)     Plan:   Patient resting comfortably, nurse at bedside. Pain under  control. No ortho needs at this time. Will continue to follow this weekend. Medicine following  Donia Ast 08/21/2018, 6:46 AM

## 2018-08-21 NOTE — Progress Notes (Signed)
Patient ID: [name], female   DOB: 1955-03-21, 63 y.o.   MRN: [id number]  PROGRESS NOTE    [name]  BJS:[id number] DOB: 1956-03-01 DOA: 08/19/2018 PCP: [name], MD   Brief Narrative:  63 year old female with history of diabetes mellitus type 2 on insulin, hypertension, hyperlipidemia, unspecified congestive heart failure, peripheral arterial disease status post right carotid endarterectomy, CAD status post CABG, bilateral BKA, osteomyelitis status post multiple surgeries with ultimate resolution with bilateral BKA's, seizures presented on 08/19/2018 with left hip pain since Mother's Day after falling onto her left side.  She was found to have left hip fracture.  Orthopedics was consulted.  Assessment & Plan:   Principal Problem:   Closed intertrochanteric fracture of hip, left, initial encounter [facility]) Active Problems:   Type 2 diabetes mellitus with diabetic mononeuropathy, with long-term current use of insulin (HCC)   Hyperlipidemia, unspecified   Anemia, normocytic normochromic   ANXIETY DEPRESSION   Essential hypertension   CAD, ARTERY BYPASS GRAFT   PVD (peripheral vascular disease) (HCC)   Phantom limb pain (HCC)  Closed intertrochanteric left hip fracture secondary to mechanical fall -Orthopedics following.  Status post surgical intervention on 08/20/2018.  Follow further orthopedic recommendations. -Fall precautions -PT/OT evaluation  -Pain management  Diabetes mellitus type 2 uncontrolled with hyperglycemia with neuropathy with long-term use of insulin -Blood sugars still elevated.  Increase Lantus to 75 units daily and NovoLog to 5 units 3 times a day with meals along with CBGs with SSI.  Hypertension -Blood pressure stable.  Continue amlodipine, Coreg, irbesartan and Lasix.  History of peripheral vascular disease status post bilateral BKA and right carotid endarterectomy -Outpatient follow-up  Hyperlipidemia -Continue home regimen  Anemia of  chronic disease most likely from chronic kidney disease--hemoglobin stable.  Monitor transfuse if hemoglobin is less than 7.  Chronic renal disease stage III--creatinine stable.  Monitor  CAD status post CABG--stable.  Continue aspirin, statin, Coreg and irbesartan.  Outpatient follow-up with cardiology  Unspecified congestive heart failure  -Currently compensated.  Echo in January 2020 showed EF of more than 55%.  Continue Lasix, Coreg.  Outpatient follow-up with cardiology   DVT prophylaxis: Lovenox Code Status: Full Family Communication: None at bedside Disposition Plan: Might need rehab placement  Consultants: Orthopedics  Procedures: Left intertrochanteric intramedullary nailing on 08/20/2018  Antimicrobials:  Perioperative  Subjective: Patient seen and examined at bedside.  She is more awake, answers some questions.  Slightly confused.  No overnight fever or vomiting. Objective: Vitals:   08/20/18 1555 08/20/18 2058 08/21/18 0657 08/21/18 0825  BP: 118/60 (!) 148/61 (!) 110/56 (!) 145/56  Pulse:  97 (!) 108 (!) 110  Resp: 16     Temp: 98.9 F (37.2 C) 99.1 F (37.3 C) 99.4 F (37.4 C)   TempSrc: Oral Oral Oral   SpO2: 93% 96% 90% 97%  Weight:      Height:        Intake/Output Summary (Last 24 hours) at 08/21/2018 1008 Last data filed at 08/21/2018 0300 Gross per 24 hour  Intake 1530 ml  Output 550 ml  Net 980 ml   Filed Weights   08/19/18 1542  Weight: 88.5 kg    Examination:  General exam: Appears calm and comfortable.  No acute distress.  More awake.  Answers some questions.  Slightly confused. Respiratory system: Bilateral decreased breath sounds at bases, scattered crackles.  No wheezing  cardiovascular system: S1 & S2 heard, slightly tachycardic Gastrointestinal system: Abdomen is nondistended, soft and  nontender. Normal bowel sounds heard. Extremities: No cyanosis; bilateral BKA.    Data Reviewed: I have personally reviewed following labs and imaging  studies  CBC: Recent Labs  Lab 08/19/18 1150 08/20/18 0459 08/20/18 1559 08/21/18 0217  WBC 5.3 5.7 9.7 8.3  NEUTROABS 3.9  --   --  6.8  HGB 9.2* 8.9* 7.9* 7.3*  HCT 28.7* 27.8* 24.8* 23.3*  MCV 88.0 87.7 88.3 88.3  PLT 221 211 237 035   Basic Metabolic Panel: Recent Labs  Lab 08/19/18 1150 08/20/18 0459 08/21/18 0217  NA 134* 135 135  K 3.9 3.6 3.7  CL 91* 93* 94*  CO2 29 30 27   GLUCOSE 378* 258* 229*  BUN 30* 26* 27*  CREATININE 1.49* 1.53* 1.90*  CALCIUM 9.3 8.9 8.5*  MG  --   --  1.5*   GFR: Estimated Creatinine Clearance: 34.2 mL/min (A) (by C-G formula based on SCr of 1.9 mg/dL (H)). Liver Function Tests: No results for input(s): AST, ALT, ALKPHOS, BILITOT, PROT, ALBUMIN in the last 168 hours. No results for input(s): LIPASE, AMYLASE in the last 168 hours. No results for input(s): AMMONIA in the last 168 hours. Coagulation Profile: Recent Labs  Lab 08/19/18 1150  INR 1.2   Cardiac Enzymes: No results for input(s): CKTOTAL, CKMB, CKMBINDEX, TROPONINI in the last 168 hours. BNP (last 3 results) No results for input(s): PROBNP in the last 8760 hours. HbA1C: Recent Labs    08/19/18 1413  HGBA1C 8.5*   CBG: Recent Labs  Lab 08/20/18 1026 08/20/18 1223 08/20/18 1705 08/20/18 2103 08/21/18 0822  GLUCAP 309* 260* 251* 216* 334*   Lipid Profile: No results for input(s): CHOL, HDL, LDLCALC, TRIG, CHOLHDL, LDLDIRECT in the last 72 hours. Thyroid Function Tests: No results for input(s): TSH, T4TOTAL, FREET4, T3FREE, THYROIDAB in the last 72 hours. Anemia Panel: No results for input(s): VITAMINB12, FOLATE, FERRITIN, TIBC, IRON, RETICCTPCT in the last 72 hours. Sepsis Labs: No results for input(s): PROCALCITON, LATICACIDVEN in the last 168 hours.  Recent Results (from the past 240 hour(s))  SARS Coronavirus 2 (CEPHEID - Performed in [place] hospital lab), Hosp Order     Status: None   Collection Time: 08/19/18 10:57 AM  Result Value Ref Range  Status   SARS Coronavirus 2 NEGATIVE NEGATIVE Final    Comment: (NOTE) If result is NEGATIVE SARS-CoV-2 target nucleic acids are NOT DETECTED. The SARS-CoV-2 RNA is generally detectable in upper and lower  respiratory specimens during the acute phase of infection. The lowest  concentration of SARS-CoV-2 viral copies this assay can detect is 250  copies / mL. A negative result does not preclude SARS-CoV-2 infection  and should not be used as the sole basis for treatment or other  patient management decisions.  A negative result may occur with  improper specimen collection / handling, submission of specimen other  than nasopharyngeal swab, presence of viral mutation(s) within the  areas targeted by this assay, and inadequate number of viral copies  (<250 copies / mL). A negative result must be combined with clinical  observations, patient history, and epidemiological information. If result is POSITIVE SARS-CoV-2 target nucleic acids are DETECTED. The SARS-CoV-2 RNA is generally detectable in upper and lower  respiratory specimens dur ing the acute phase of infection.  Positive  results are indicative of active infection with SARS-CoV-2.  Clinical  correlation with patient history and other diagnostic information is  necessary to determine patient infection status.  Positive results do  not rule out  bacterial infection or co-infection with other viruses. If result is PRESUMPTIVE POSTIVE SARS-CoV-2 nucleic acids MAY BE PRESENT.   A presumptive positive result was obtained on the submitted specimen  and confirmed on repeat testing.  While 2019 novel coronavirus  (SARS-CoV-2) nucleic acids may be present in the submitted sample  additional confirmatory testing may be necessary for epidemiological  and / or clinical management purposes  to differentiate between  SARS-CoV-2 and other Sarbecovirus currently known to infect humans.  If clinically indicated additional testing with an alternate  test  methodology [phone]) is advised. The SARS-CoV-2 RNA is generally  detectable in upper and lower respiratory sp ecimens during the acute  phase of infection. The expected result is Negative. Fact Sheet for Patients:  StrictlyIdeas.no Fact Sheet for Healthcare Providers: BankingDealers.co.za This test is not yet approved or cleared by the [place] FDA and has been authorized for detection and/or diagnosis of SARS-CoV-2 by FDA under an Emergency Use Authorization (EUA).  This EUA will remain in effect (meaning this test can be used) for the duration of the COVID-19 declaration under Section 564(b)(1) of the Act, 21 U.S.C. section 360bbb-3(b)(1), unless the authorization is terminated or revoked sooner. Performed at [facility], [place] [address] [place], [place] [address]   Surgical pcr screen     Status: Abnormal   Collection Time: 08/19/18  3:40 PM  Result Value Ref Range Status   MRSA, PCR NEGATIVE NEGATIVE Final   Staphylococcus aureus POSITIVE (A) NEGATIVE Final    Comment: (NOTE) The Xpert SA Assay (FDA approved for NASAL specimens in patients 71 years of age and older), is one component of a comprehensive surveillance program. It is not intended to diagnose infection nor to guide or monitor treatment. Performed at [facility], [place] [address] [place], [place] [address]          Radiology Studies: Ct Hip Left Wo Contrast  Result Date: 08/19/2018 CLINICAL DATA:  Evaluate left hip fracture.  Patient fell 07/25/2018 EXAM: CT OF THE LEFT HIP WITHOUT CONTRAST TECHNIQUE: Multidetector CT imaging of the left hip was performed according to the standard protocol. Multiplanar CT image reconstructions were also generated. COMPARISON:  Radiograph 08/16/1999 FINDINGS: Comminuted intertrochanteric fracture of the left hip appears subacute with bony resorptive changes and areas of attempted healing/new bone formation  around the fracture site. The femoral head is normally located. No acetabular fractures. The visualized left hemipelvis is intact. Moderate vascular calcifications are noted. No significant soft tissue abnormalities. No significant intrapelvic abnormalities. IMPRESSION: 1. Subacute comminuted intertrochanteric fracture of the left hip with bony resorptive changes and areas of attempted healing/new bone formation. 2. No pelvic fractures. Electronically Signed   By: Marijo Sanes M.D.   On: 08/19/2018 20:26   Dg Knee Complete 4 Views Left  Result Date: 08/19/2018 CLINICAL DATA:  Left knee pain after fall 3 days ago. EXAM: LEFT KNEE - COMPLETE 4+ VIEW COMPARISON:  Radiographs of August 16, 2018. FINDINGS: Status post below-knee amputation of left knee. Multiple stents are noted in the distal left superficial femoral artery. No fracture or dislocation is noted. No lytic destruction is seen to suggest osteomyelitis. IMPRESSION: Status post below-knee amputation of left knee. No acute abnormality is noted. Electronically Signed   By: Marijo Conception M.D.   On: 08/19/2018 11:49   Dg C-arm 1-60 Min  Result Date: 08/20/2018 CLINICAL DATA:  Intertrochanteric fracture of the proximal left femur. EXAM: DG C-ARM 61-120 MIN; OPERATIVE LEFT HIP WITH  PELVIS COMPARISON:  Radiographs dated 08/16/2018 FINDINGS: Multiple C-arm images demonstrate the patient undergoing open reduction and internal fixation of the comminuted intertrochanteric fracture. Intramedullary nail and screws appear in good position. Near anatomic alignment and position of the fracture fragments after open reduction and internal fixation. IMPRESSION: Open reduction and internal fixation of intertrochanteric fracture of the proximal left femur. FLUOROSCOPY TIME:  4 minutes C-arm fluoroscopic images were obtained intraoperatively and submitted for post operative interpretation. Electronically Signed   By: Lorriane Shire M.D.   On: 08/20/2018 11:50   Dg Hip  Operative Unilat W Or W/o Pelvis Left  Result Date: 08/20/2018 CLINICAL DATA:  Intertrochanteric fracture of the proximal left femur. EXAM: DG C-ARM 61-120 MIN; OPERATIVE LEFT HIP WITH PELVIS COMPARISON:  Radiographs dated 08/16/2018 FINDINGS: Multiple C-arm images demonstrate the patient undergoing open reduction and internal fixation of the comminuted intertrochanteric fracture. Intramedullary nail and screws appear in good position. Near anatomic alignment and position of the fracture fragments after open reduction and internal fixation. IMPRESSION: Open reduction and internal fixation of intertrochanteric fracture of the proximal left femur. FLUOROSCOPY TIME:  4 minutes C-arm fluoroscopic images were obtained intraoperatively and submitted for post operative interpretation. Electronically Signed   By: Lorriane Shire M.D.   On: 08/20/2018 11:50   Dg Femur Port Min 2 Views Left  Result Date: 08/20/2018 CLINICAL DATA:  LEFT femur fracture EXAM: LEFT FEMUR PORTABLE 2 VIEWS COMPARISON:  08/16/2018 FINDINGS: Interval intramedullary nail fixation of LEFT intertrochanteric femur fracture. Anatomic alignment. No fracture. Vascular stent noted the femoral artery IMPRESSION: No complication following intertrochanteric femur fracture. Electronically Signed   By: Suzy Bouchard M.D.   On: 08/20/2018 14:35        Scheduled Meds: . amLODipine  5 mg Oral BID  . aspirin EC  325 mg Oral Daily  . atorvastatin  80 mg Oral q1800  . carvedilol  25 mg Oral Daily  . chlorhexidine  60 mL Topical Once  . Chlorhexidine Gluconate Cloth  6 each Topical Daily  . enoxaparin (LOVENOX) injection  40 mg Subcutaneous Q24H  . feeding supplement  296 mL Oral Once  . feeding supplement (GLUCERNA SHAKE)  237 mL Oral BID BM  . furosemide  40 mg Oral Daily  . gabapentin  100 mg Oral TID  . insulin aspart  0-15 Units Subcutaneous TID WC  . insulin aspart  0-5 Units Subcutaneous QHS  . insulin aspart  3 Units Subcutaneous TID  WC  . insulin glargine  70 Units Subcutaneous Daily  . irbesartan  300 mg Oral Daily  . levETIRAcetam  1,500 mg Oral QHS  . lidocaine  1 patch Transdermal Q24H  . multivitamin with minerals  1 tablet Oral Daily  . mupirocin ointment  1 application Nasal BID  . sertraline  100 mg Oral BID   Continuous Infusions: .  ceFAZolin (ANCEF) IV 2 g (08/21/18 0556)  . lactated ringers 10 mL/hr at 08/20/18 0836  . methocarbamol (ROBAXIN) IV       LOS: 2 days        Aline August, MD [facility] 08/21/2018, 10:08 AM

## 2018-08-22 LAB — BASIC METABOLIC PANEL
Anion gap: 12 (ref 5–15)
BUN: 29 mg/dL — ABNORMAL HIGH (ref 8–23)
CO2: 26 mmol/L (ref 22–32)
Calcium: 8.2 mg/dL — ABNORMAL LOW (ref 8.9–10.3)
Chloride: 95 mmol/L — ABNORMAL LOW (ref 98–111)
Creatinine, Ser: 1.5 mg/dL — ABNORMAL HIGH (ref 0.44–1.00)
GFR calc Af Amer: 43 mL/min — ABNORMAL LOW (ref >60)
GFR calc non Af Amer: 37 mL/min — ABNORMAL LOW (ref >60)
Glucose, Bld: 317 mg/dL — ABNORMAL HIGH (ref 70–99)
Potassium: 3.5 mmol/L (ref 3.5–5.1)
Sodium: 133 mmol/L — ABNORMAL LOW (ref 135–145)

## 2018-08-22 LAB — CBC WITH DIFFERENTIAL/PLATELET
Abs Immature Granulocytes: 0.05 10*3/uL (ref 0.00–0.07)
Basophils Absolute: 0 10*3/uL (ref 0.0–0.1)
Basophils Relative: 0 %
Eosinophils Absolute: 0.1 10*3/uL (ref 0.0–0.5)
Eosinophils Relative: 1 %
HCT: 20.6 % — ABNORMAL LOW (ref 36.0–46.0)
Hemoglobin: 6.4 g/dL — CL (ref 12.0–15.0)
Immature Granulocytes: 1 %
Lymphocytes Relative: 7 %
Lymphs Abs: 0.6 10*3/uL — ABNORMAL LOW (ref 0.7–4.0)
MCH: 27.7 pg (ref 26.0–34.0)
MCHC: 31.1 g/dL (ref 30.0–36.0)
MCV: 89.2 fL (ref 80.0–100.0)
Monocytes Absolute: 0.6 10*3/uL (ref 0.1–1.0)
Monocytes Relative: 7 %
Neutro Abs: 6.8 10*3/uL (ref 1.7–7.7)
Neutrophils Relative %: 84 %
Platelets: 190 10*3/uL (ref 150–400)
RBC: 2.31 MIL/uL — ABNORMAL LOW (ref 3.87–5.11)
RDW: 16.2 % — ABNORMAL HIGH (ref 11.5–15.5)
WBC: 8.1 10*3/uL (ref 4.0–10.5)
nRBC: 0 % (ref 0.0–0.2)

## 2018-08-22 LAB — GLUCOSE, CAPILLARY: Glucose-Capillary: 198 mg/dL — ABNORMAL HIGH (ref 70–99)

## 2018-08-22 LAB — PREPARE RBC (CROSSMATCH)

## 2018-08-22 LAB — MAGNESIUM: Magnesium: 1.5 mg/dL — ABNORMAL LOW (ref 1.7–2.4)

## 2018-08-22 MED ORDER — AMLODIPINE BESYLATE 5 MG PO TABS: 5.0000 mg | ORAL_TABLET | Freq: Every day | ORAL | Status: DC

## 2018-08-22 MED ORDER — METHOCARBAMOL 500 MG PO TABS: 500.0000 mg | ORAL_TABLET | Freq: Four times a day (QID) | ORAL | 0 refills | Status: DC | PRN

## 2018-08-22 MED ORDER — HYDROCODONE-ACETAMINOPHEN 5-325 MG PO TABS: 1.0000 | ORAL_TABLET | ORAL | 0 refills | Status: AC | PRN

## 2018-08-22 MED ORDER — SODIUM CHLORIDE 0.9% IV SOLUTION: Freq: Once | INTRAVENOUS | Status: DC

## 2018-08-22 MED ORDER — PREGABALIN 50 MG PO CAPS: 50.0000 mg | ORAL_CAPSULE | Freq: Two times a day (BID) | ORAL | Status: DC | PRN

## 2018-08-22 MED ORDER — INSULIN ASPART 100 UNIT/ML ~~LOC~~ SOLN
5.0000 [IU] | Freq: Three times a day (TID) | SUBCUTANEOUS | Status: DC
  Administered 2018-08-22: 5 [IU] via SUBCUTANEOUS

## 2018-08-22 MED ORDER — LEVETIRACETAM ER 750 MG PO TB24: 1500.0000 mg | ORAL_TABLET | Freq: Every day | ORAL | Status: DC

## 2018-08-22 MED ORDER — POLYETHYLENE GLYCOL 3350 17 G PO PACK: 17.0000 g | PACK | Freq: Every day | ORAL | 0 refills | Status: DC | PRN

## 2018-08-22 MED ORDER — INSULIN GLARGINE 100 UNIT/ML ~~LOC~~ SOLN
75.0000 [IU] | Freq: Every day | SUBCUTANEOUS | Status: DC
  Filled 2018-08-22: qty 0.75

## 2018-08-22 NOTE — Progress Notes (Signed)
Pt aware of Hgb level. Pt refused blood transfusion. Pt stated "that's okay, I'll take some extra iron tablets at home". Starla Link, MD aware of pt's response. Will continue to monitor.

## 2018-08-22 NOTE — Progress Notes (Addendum)
Pt left AMA. Starla Link, MD aware.

## 2018-08-22 NOTE — Plan of Care (Signed)
  Problem: Pain Managment: Goal: General experience of comfort will improve Outcome: Progressing   Problem: Safety: Goal: Ability to remain free from injury will improve Outcome: Progressing   Problem: Skin Integrity: Goal: Risk for impaired skin integrity will decrease Outcome: Progressing   

## 2018-08-22 NOTE — Progress Notes (Signed)
When pt transferred from bed to wheelchair for pt discharge, pt fell on prosthetics. No injury to head or skin. Pt alert and oriented. Pt adamant about going home. Pt's son, Chance, aware. Starla Link, MD aware.

## 2018-08-22 NOTE — Progress Notes (Signed)
Pt chose to leave AMA. Pt signed AMA form.

## 2018-08-22 NOTE — Progress Notes (Signed)
Patient ID: [name], female   DOB: 08-Mar-1956, 63 y.o.   MRN: [id number] Repeat hemoglobin this morning was reported at 6.4.  I have ordered a unit of packed red cell transfusion.  Patient refuses to have blood transfusion and wants to leave today.  I will do not feel comfortable discharging her with his hemoglobin.  I will cancel the discharge order.  She will have to sign out [place].  She is alert awake oriented x3 and understands the risks of the same including death.

## 2018-08-22 NOTE — Progress Notes (Addendum)
CRITICAL VALUE ALERT  Critical Value:  Hgb 6.4  Date & Time Notied:  08/22/2018 at 1144  Provider Notified: Starla Link, MD  Orders Received/Actions taken: Awaiting MD orders

## 2018-08-22 NOTE — Plan of Care (Signed)
Problem: Education: Goal: Knowledge of General Education information will improve Description Including pain rating scale, medication(s)/side effects and non-pharmacologic comfort measures Outcome: Progressing   Problem: Education: Goal: Knowledge of General Education information will improve Description Including pain rating scale, medication(s)/side effects and non-pharmacologic comfort measures Outcome: Progressing   Problem: Clinical Measurements: Goal: Respiratory complications will improve Outcome: Progressing Goal: Cardiovascular complication will be avoided Outcome: Progressing   Problem: Activity: Goal: Risk for activity intolerance will decrease Outcome: Progressing   Problem: Nutrition: Goal: Adequate nutrition will be maintained Outcome: Progressing   Problem: Elimination: Goal: Will not experience complications related to urinary retention Outcome: Progressing   Problem: Pain Managment: Goal: General experience of comfort will improve Outcome: Progressing   Problem: Safety: Goal: Ability to remain free from injury will improve Outcome: Progressing   Problem: Skin Integrity: Goal: Risk for impaired skin integrity will decrease Outcome: Progressing

## 2018-08-22 NOTE — Discharge Summary (Signed)
Physician Discharge Summary  [name] VOZ:[id number] DOB: 1955-08-24 DOA: 08/19/2018  PCP: [name], MD  Admit date: 08/19/2018 Discharge date: 08/22/2018  Admitted From: Home Disposition: Home.  Patient refused SNF  Recommendations for Outpatient Follow-up:  1. Follow up with PCP in 1 week with repeat CBC/BMP 2. Outpatient follow-up with orthopedics.  Wound care and activity as per orthopedics recommendations.  Outpatient pain management and DVT prophylaxis as per orthopedics recommendations. 3. Follow up in ED if symptoms worsen or new appear   Home Health: Home with PT Equipment/Devices: None  Discharge Condition: Stable CODE STATUS: Full Diet recommendation: Heart healthy/carb modified  Brief/Interim Summary: 62 year old female with history of diabetes mellitus type 2 on insulin, hypertension, hyperlipidemia, unspecified congestive heart failure, peripheral arterial disease status post right carotid endarterectomy, CAD status post CABG, bilateral BKA, osteomyelitis status post multiple surgeries with ultimate resolution with bilateral BKA's, seizures presented on 08/19/2018 with left hip pain since Mother's Day after falling onto her left side.  She was found to have left hip fracture.  Orthopedics was consulted.  Status post surgical intervention on 08/20/2018.  PT recommended SNF placement.  Patient refused SNF placement.  Orthopedics has cleared the patient for discharge.  She will be discharged home with home health PT.  Outpatient pain management, DVT prophylaxis and wound care/activity as per orthopedics recommendations.  Discharge Diagnoses:  Principal Problem:   Closed intertrochanteric fracture of hip, left, initial encounter [employer]) Active Problems:   Type 2 diabetes mellitus with diabetic mononeuropathy, with long-term current use of insulin (HCC)   Hyperlipidemia, unspecified   Anemia, normocytic normochromic   ANXIETY DEPRESSION   Essential hypertension   CAD,  ARTERY BYPASS GRAFT   PVD (peripheral vascular disease) (HCC)   Phantom limb pain (HCC)  Closed intertrochanteric left hip fracture secondary to mechanical fall -Orthopedics following.  Status post surgical intervention on 08/20/2018. -PT recommended SNF placement.  Patient refused SNF placement.  Orthopedics has cleared the patient for discharge.  She will be discharged home with home health PT.  Outpatient pain management, DVT prophylaxis and wound care/activity as per orthopedics recommendations.  Diabetes mellitus type 2 uncontrolled with hyperglycemia with neuropathy with long-term use of insulin -Blood sugars still elevated.    Continue home regimen.  Outpatient follow-up.  Patient refused for the Lantus to be increased to 75 units daily.  Hypertension -Blood pressure stable.  Blood pressure was on the lower side on 08/21/2018.  Lasix was held.  Amlodipine was held as well.  Blood pressure improving.  Decrease amlodipine to 5 mg daily on discharge.  Keep Lasix on hold for now.  Continue Coreg and olmesartan.   Outpatient follow-up with PCP for further management of antihypertensive regimen.  History of peripheral vascular disease status post bilateral BKA and right carotid endarterectomy -Outpatient follow-up  Hyperlipidemia -Continue home regimen  Anemia of chronic disease most likely from chronic kidney disease--hemoglobin stable.  Monitor transfuse if hemoglobin is less than 7.  No labs today.  Monitor  Chronic kidney disease stage III--creatinine has slightly worsened on 08/21/2018.  She is making urine.  No labs available for today.  She desperately wants to go home today otherwise she would leave AMA.  Follow BMP within a week as an outpatient.  Lasix will remain on hold for now.  CAD status post CABG--stable.  Continue aspirin, statin, Coreg and irbesartan.  Outpatient follow-up with cardiology  Unspecified congestive heart failure  -Currently compensated.  Echo in January  2020 showed EF of  more than 55%.  Continue  olmesartan, Coreg.  Outpatient follow-up with cardiology.  Lasix plan as above.  Discharge Instructions  Discharge Instructions    Diet - low sodium heart healthy   Complete by:  As directed    Diet Carb Modified   Complete by:  As directed    Increase activity slowly   Complete by:  As directed      Allergies as of 08/22/2018      Reactions   Tape Rash   Redness, rash, itchiness Redness, rash, itchiness Redness, rash, itchiness No Plastic tape, paper tape OK Redness, rash, itchiness Redness, rash, itchiness No Plastic tape, paper tape OK      Medication List    STOP taking these medications   furosemide 40 MG tablet Commonly known as:  LASIX     TAKE these medications   acetaminophen-codeine 300-30 MG tablet Commonly known as:  TYLENOL #3 Take 1 tablet by mouth every 4 (four) hours as needed.   albuterol 108 (90 Base) MCG/ACT inhaler Commonly known as:  VENTOLIN HFA Inhale 1-2 puffs into the lungs every 6 (six) hours as needed for wheezing or shortness of breath.   amLODipine 5 MG tablet Commonly known as:  NORVASC Take 1 tablet (5 mg total) by mouth daily. What changed:  how much to take   aspirin 81 MG tablet Take 1 tablet (81 mg total) by mouth daily.   atorvastatin 80 MG tablet Commonly known as:  LIPITOR Take 1 tablet (80 mg total) by mouth daily.   busPIRone 15 MG tablet Commonly known as:  BUSPAR Take 30 mg by mouth 2 (two) times daily as needed (anxiety).   carvedilol 25 MG tablet Commonly known as:  COREG TAKE 1 TABLET BY MOUTH EVERY DAY   clopidogrel 75 MG tablet Commonly known as:  PLAVIX Take 1 tablet (75 mg total) by mouth daily.   Lantus SoloStar 100 UNIT/ML Solostar Pen Generic drug:  Insulin Glargine Inject 70 Units into the skin daily. Taking in the evening   Levetiracetam 750 MG Tb24 Take 2 tablets (1,500 mg total) by mouth at bedtime.   lidocaine 5 % Commonly known as:   LIDODERM Place 1 patch onto the skin daily. evening   methocarbamol 500 MG tablet Commonly known as:  ROBAXIN Take 1 tablet (500 mg total) by mouth every 6 (six) hours as needed for muscle spasms.   multivitamins ther. w/minerals Tabs tablet Take 1 tablet by mouth daily.   olmesartan 40 MG tablet Commonly known as:  BENICAR Take 40 mg by mouth daily.   polyethylene glycol 17 g packet Commonly known as:  MIRALAX / GLYCOLAX Take 17 g by mouth daily as needed for mild constipation.   pregabalin 50 MG capsule Commonly known as:  Lyrica Take 1 capsule (50 mg total) by mouth 2 (two) times daily as needed (pain).   sertraline 100 MG tablet Commonly known as:  ZOLOFT Take 1 tablet (100 mg total) by mouth 2 (two) times daily.      Follow-up Information    [name], MD. Schedule an appointment as soon as possible for a visit in 1 week(s).   Specialty:  Family Medicine Why:  with cbc/bmp Contact information: [place] [address] [phone]        Shona Needles, MD. Schedule an appointment as soon as possible for a visit in 1 week(s).   Specialty:  Orthopedic Surgery Contact information: [place]  [address] [phone]          Allergies  Allergen Reactions  . Tape Rash    Redness, rash, itchiness Redness, rash, itchiness Redness, rash, itchiness No Plastic tape, paper tape OK Redness, rash, itchiness Redness, rash, itchiness No Plastic tape, paper tape OK    Consultations: Orthopedics  Procedures/Studies: Ct Hip Left Wo Contrast  Result Date: 08/19/2018 CLINICAL DATA:  Evaluate left hip fracture.  Patient fell 07/25/2018 EXAM: CT OF THE LEFT HIP WITHOUT CONTRAST TECHNIQUE: Multidetector CT imaging of the left hip was performed according to the standard protocol. Multiplanar CT image reconstructions were also generated. COMPARISON:  Radiograph 08/16/1999 FINDINGS: Comminuted intertrochanteric fracture of  the left hip appears subacute with bony resorptive changes and areas of attempted healing/new bone formation around the fracture site. The femoral head is normally located. No acetabular fractures. The visualized left hemipelvis is intact. Moderate vascular calcifications are noted. No significant soft tissue abnormalities. No significant intrapelvic abnormalities. IMPRESSION: 1. Subacute comminuted intertrochanteric fracture of the left hip with bony resorptive changes and areas of attempted healing/new bone formation. 2. No pelvic fractures. Electronically Signed   By: Marijo Sanes M.D.   On: 08/19/2018 20:26   Dg Knee Complete 4 Views Left  Result Date: 08/19/2018 CLINICAL DATA:  Left knee pain after fall 3 days ago. EXAM: LEFT KNEE - COMPLETE 4+ VIEW COMPARISON:  Radiographs of August 16, 2018. FINDINGS: Status post below-knee amputation of left knee. Multiple stents are noted in the distal left superficial femoral artery. No fracture or dislocation is noted. No lytic destruction is seen to suggest osteomyelitis. IMPRESSION: Status post below-knee amputation of left knee. No acute abnormality is noted. Electronically Signed   By: Marijo Conception M.D.   On: 08/19/2018 11:49   Dg C-arm 1-60 Min  Result Date: 08/20/2018 CLINICAL DATA:  Intertrochanteric fracture of the proximal left femur. EXAM: DG C-ARM 61-120 MIN; OPERATIVE LEFT HIP WITH PELVIS COMPARISON:  Radiographs dated 08/16/2018 FINDINGS: Multiple C-arm images demonstrate the patient undergoing open reduction and internal fixation of the comminuted intertrochanteric fracture. Intramedullary nail and screws appear in good position. Near anatomic alignment and position of the fracture fragments after open reduction and internal fixation. IMPRESSION: Open reduction and internal fixation of intertrochanteric fracture of the proximal left femur. FLUOROSCOPY TIME:  4 minutes C-arm fluoroscopic images were obtained intraoperatively and submitted for post  operative interpretation. Electronically Signed   By: Lorriane Shire M.D.   On: 08/20/2018 11:50   Dg Hip Operative Unilat W Or W/o Pelvis Left  Result Date: 08/20/2018 CLINICAL DATA:  Intertrochanteric fracture of the proximal left femur. EXAM: DG C-ARM 61-120 MIN; OPERATIVE LEFT HIP WITH PELVIS COMPARISON:  Radiographs dated 08/16/2018 FINDINGS: Multiple C-arm images demonstrate the patient undergoing open reduction and internal fixation of the comminuted intertrochanteric fracture. Intramedullary nail and screws appear in good position. Near anatomic alignment and position of the fracture fragments after open reduction and internal fixation. IMPRESSION: Open reduction and internal fixation of intertrochanteric fracture of the proximal left femur. FLUOROSCOPY TIME:  4 minutes C-arm fluoroscopic images were obtained intraoperatively and submitted for post operative interpretation. Electronically Signed   By: Lorriane Shire M.D.   On: 08/20/2018 11:50   Dg Hip Unilat With Pelvis 2-3 Views Left  Result Date: 08/16/2018 CLINICAL DATA:  Injury to the left hip 07/25/2018 after falling, evaluate for fx (pt @ [facility], tech to call report)Left thigh pain EXAM: DG HIP (WITH OR WITHOUT PELVIS) 2-3V LEFT COMPARISON:  None. FINDINGS:  Fracture of the proximal LEFT femur. Fracture extends through the intertrochanteric plane. Valgus angulation with mild override. Vascular stent noted in the femoral artery IMPRESSION: LEFT intertrochanteric femur fracture. Electronically Signed   By: Suzy Bouchard M.D.   On: 08/16/2018 16:38   Dg Femur Port Min 2 Views Left  Result Date: 08/20/2018 CLINICAL DATA:  LEFT femur fracture EXAM: LEFT FEMUR PORTABLE 2 VIEWS COMPARISON:  08/16/2018 FINDINGS: Interval intramedullary nail fixation of LEFT intertrochanteric femur fracture. Anatomic alignment. No fracture. Vascular stent noted the femoral artery IMPRESSION: No complication following intertrochanteric femur fracture. Electronically  Signed   By: Suzy Bouchard M.D.   On: 08/20/2018 14:35       Subjective: Patient seen and examined at bedside.  She is adamant that she wants to go home today and absolutely refuses to go to SNF.  No overnight fever, nausea or vomiting.  Tolerating diet. Discharge Exam: Vitals:   08/22/18 0516 08/22/18 0934  BP: (!) 146/62 101/60  Pulse: (!) 101 (!) 112  Resp: 14   Temp: 98.9 F (37.2 C)   SpO2: 90%     General: Pt is alert, awake, not in acute distress Cardiovascular: Slightly tachycardic, S1/S2 + Respiratory: bilateral decreased breath sounds at bases Abdominal: Soft, NT, ND, bowel sounds + Extremities: Bilateral BKA, no cyanosis    The results of significant diagnostics from this hospitalization (including imaging, microbiology, ancillary and laboratory) are listed below for reference.     Microbiology: Recent Results (from the past 240 hour(s))  SARS Coronavirus 2 (CEPHEID - Performed in [facility] hospital lab), Hosp Order     Status: None   Collection Time: 08/19/18 10:57 AM  Result Value Ref Range Status   SARS Coronavirus 2 NEGATIVE NEGATIVE Final    Comment: (NOTE) If result is NEGATIVE SARS-CoV-2 target nucleic acids are NOT DETECTED. The SARS-CoV-2 RNA is generally detectable in upper and lower  respiratory specimens during the acute phase of infection. The lowest  concentration of SARS-CoV-2 viral copies this assay can detect is 250  copies / mL. A negative result does not preclude SARS-CoV-2 infection  and should not be used as the sole basis for treatment or other  patient management decisions.  A negative result may occur with  improper specimen collection / handling, submission of specimen other  than nasopharyngeal swab, presence of viral mutation(s) within the  areas targeted by this assay, and inadequate number of viral copies  (<250 copies / mL). A negative result must be combined with clinical  observations, patient history, and  epidemiological information. If result is POSITIVE SARS-CoV-2 target nucleic acids are DETECTED. The SARS-CoV-2 RNA is generally detectable in upper and lower  respiratory specimens dur ing the acute phase of infection.  Positive  results are indicative of active infection with SARS-CoV-2.  Clinical  correlation with patient history and other diagnostic information is  necessary to determine patient infection status.  Positive results do  not rule out bacterial infection or co-infection with other viruses. If result is PRESUMPTIVE POSTIVE SARS-CoV-2 nucleic acids MAY BE PRESENT.   A presumptive positive result was obtained on the submitted specimen  and confirmed on repeat testing.  While 2019 novel coronavirus  (SARS-CoV-2) nucleic acids may be present in the submitted sample  additional confirmatory testing may be necessary for epidemiological  and / or clinical management purposes  to differentiate between  SARS-CoV-2 and other Sarbecovirus currently known to infect humans.  If clinically indicated additional testing with an alternate test  methodology [phone]) is advised. The SARS-CoV-2 RNA is generally  detectable in upper and lower respiratory sp ecimens during the acute  phase of infection. The expected result is Negative. Fact Sheet for Patients:  StrictlyIdeas.no Fact Sheet for Healthcare Providers: BankingDealers.co.za This test is not yet approved or cleared by the [place] FDA and has been authorized for detection and/or diagnosis of SARS-CoV-2 by FDA under an Emergency Use Authorization (EUA).  This EUA will remain in effect (meaning this test can be used) for the duration of the COVID-19 declaration under Section 564(b)(1) of the Act, 21 U.S.C. section 360bbb-3(b)(1), unless the authorization is terminated or revoked sooner. Performed at [facility], [place] [address], [place], [place] [address]   Surgical pcr  screen     Status: Abnormal   Collection Time: 08/19/18  3:40 PM  Result Value Ref Range Status   MRSA, PCR NEGATIVE NEGATIVE Final   Staphylococcus aureus POSITIVE (A) NEGATIVE Final    Comment: (NOTE) The Xpert SA Assay (FDA approved for NASAL specimens in patients 24 years of age and older), is one component of a comprehensive surveillance program. It is not intended to diagnose infection nor to guide or monitor treatment. Performed at [facility], [place] [address], [place], [place] [address]      Labs: BNP (last 3 results) No results for input(s): BNP in the last 8760 hours. Basic Metabolic Panel: Recent Labs  Lab 08/19/18 1150 08/20/18 0459 08/21/18 0217  NA 134* 135 135  K 3.9 3.6 3.7  CL 91* 93* 94*  CO2 29 30 27   GLUCOSE 378* 258* 229*  BUN 30* 26* 27*  CREATININE 1.49* 1.53* 1.90*  CALCIUM 9.3 8.9 8.5*  MG  --   --  1.5*   Liver Function Tests: No results for input(s): AST, ALT, ALKPHOS, BILITOT, PROT, ALBUMIN in the last 168 hours. No results for input(s): LIPASE, AMYLASE in the last 168 hours. No results for input(s): AMMONIA in the last 168 hours. CBC: Recent Labs  Lab 08/19/18 1150 08/20/18 0459 08/20/18 1559 08/21/18 0217  WBC 5.3 5.7 9.7 8.3  NEUTROABS 3.9  --   --  6.8  HGB 9.2* 8.9* 7.9* 7.3*  HCT 28.7* 27.8* 24.8* 23.3*  MCV 88.0 87.7 88.3 88.3  PLT 221 211 237 219   Cardiac Enzymes: No results for input(s): CKTOTAL, CKMB, CKMBINDEX, TROPONINI in the last 168 hours. BNP: Invalid input(s): POCBNP CBG: Recent Labs  Lab 08/21/18 1042 08/21/18 1150 08/21/18 1723 08/21/18 2017 08/22/18 0816  GLUCAP 324* 332* 181* 225* 198*   D-Dimer No results for input(s): DDIMER in the last 72 hours. Hgb A1c Recent Labs    08/19/18 1413  HGBA1C 8.5*   Lipid Profile No results for input(s): CHOL, HDL, LDLCALC, TRIG, CHOLHDL, LDLDIRECT in the last 72 hours. Thyroid function studies No results for input(s): TSH, T4TOTAL, T3FREE, THYROIDAB  in the last 72 hours.  Invalid input(s): FREET3 Anemia work up No results for input(s): VITAMINB12, FOLATE, FERRITIN, TIBC, IRON, RETICCTPCT in the last 72 hours. Urinalysis    Component Value Date/Time   COLORURINE YELLOW 09/02/2013 1440   APPEARANCEUR CLEAR 09/02/2013 1440   LABSPEC 1.016 09/02/2013 1440   PHURINE 6.5 09/02/2013 1440   GLUCOSEU 100 (A) 09/02/2013 1440   HGBUR MODERATE (A) 09/02/2013 1440   BILIRUBINUR NEGATIVE 09/02/2013 1440   KETONESUR 15 (A) 09/02/2013 1440   PROTEINUR >300 (A) 09/02/2013 1440   UROBILINOGEN 0.2 09/02/2013 1440   NITRITE NEGATIVE 09/02/2013 1440  LEUKOCYTESUR NEGATIVE 09/02/2013 1440   Sepsis Labs Invalid input(s): PROCALCITONIN,  WBC,  LACTICIDVEN Microbiology Recent Results (from the past 240 hour(s))  SARS Coronavirus 2 (CEPHEID - Performed in [facility] hospital lab), Hosp Order     Status: None   Collection Time: 08/19/18 10:57 AM  Result Value Ref Range Status   SARS Coronavirus 2 NEGATIVE NEGATIVE Final    Comment: (NOTE) If result is NEGATIVE SARS-CoV-2 target nucleic acids are NOT DETECTED. The SARS-CoV-2 RNA is generally detectable in upper and lower  respiratory specimens during the acute phase of infection. The lowest  concentration of SARS-CoV-2 viral copies this assay can detect is 250  copies / mL. A negative result does not preclude SARS-CoV-2 infection  and should not be used as the sole basis for treatment or other  patient management decisions.  A negative result may occur with  improper specimen collection / handling, submission of specimen other  than nasopharyngeal swab, presence of viral mutation(s) within the  areas targeted by this assay, and inadequate number of viral copies  (<250 copies / mL). A negative result must be combined with clinical  observations, patient history, and epidemiological information. If result is POSITIVE SARS-CoV-2 target nucleic acids are DETECTED. The SARS-CoV-2 RNA is generally  detectable in upper and lower  respiratory specimens dur ing the acute phase of infection.  Positive  results are indicative of active infection with SARS-CoV-2.  Clinical  correlation with patient history and other diagnostic information is  necessary to determine patient infection status.  Positive results do  not rule out bacterial infection or co-infection with other viruses. If result is PRESUMPTIVE POSTIVE SARS-CoV-2 nucleic acids MAY BE PRESENT.   A presumptive positive result was obtained on the submitted specimen  and confirmed on repeat testing.  While 2019 novel coronavirus  (SARS-CoV-2) nucleic acids may be present in the submitted sample  additional confirmatory testing may be necessary for epidemiological  and / or clinical management purposes  to differentiate between  SARS-CoV-2 and other Sarbecovirus currently known to infect humans.  If clinically indicated additional testing with an alternate test  methodology [phone]) is advised. The SARS-CoV-2 RNA is generally  detectable in upper and lower respiratory sp ecimens during the acute  phase of infection. The expected result is Negative. Fact Sheet for Patients:  StrictlyIdeas.no Fact Sheet for Healthcare Providers: BankingDealers.co.za This test is not yet approved or cleared by the [place] FDA and has been authorized for detection and/or diagnosis of SARS-CoV-2 by FDA under an Emergency Use Authorization (EUA).  This EUA will remain in effect (meaning this test can be used) for the duration of the COVID-19 declaration under Section 564(b)(1) of the Act, 21 U.S.C. section 360bbb-3(b)(1), unless the authorization is terminated or revoked sooner. Performed at [facility], [place] [address], [place], [place] [address]   Surgical pcr screen     Status: Abnormal   Collection Time: 08/19/18  3:40 PM  Result Value Ref Range Status   MRSA, PCR NEGATIVE NEGATIVE  Final   Staphylococcus aureus POSITIVE (A) NEGATIVE Final    Comment: (NOTE) The Xpert SA Assay (FDA approved for NASAL specimens in patients 28 years of age and older), is one component of a comprehensive surveillance program. It is not intended to diagnose infection nor to guide or monitor treatment. Performed at [facility], [place] [address], [place], [place] [address]      Time coordinating discharge: 35 minutes  SIGNED:   Liah Morr  Starla Link, MD  [facility] 08/22/2018, 11:26 AM

## 2018-08-22 NOTE — TOC Initial Note (Signed)
Transition of Care [facility]) - Initial/Assessment Note    Patient Details  Name: [name] MRN: [id number] Date of Birth: 1955-06-28  Transition of Care [facility]) CM/SW Contact:    Pollie Friar, RN Phone Number: 08/22/2018, 12:42 PM  Clinical Narrative:                 Recommendations are for SNF rehab. Pt is refusing. She is concerned about the Covid virus and going to a rehab. CM provided her choice for [facility] and she refused this also. She states her family can provide all needed therapy and assistance at home. Bedside RN aware of refusal. Will update the MD.  Expected Discharge Plan: [place] Barriers to Discharge: Continued Medical Work up   Patient Goals and CMS Choice   CMS Medicare.gov Compare Post Acute Care list provided to:: Patient Choice offered to / list presented to : Patient  Expected Discharge Plan and Services Expected Discharge Plan: [place]   Discharge Planning Services: CM Consult Post Acute Care Choice: Home Health   Expected Discharge Date: 08/22/18                         HH Arranged: Patient Refused HH          Prior Living Arrangements/Services   Lives with:: Adult Children, Spouse Patient language and need for interpreter reviewed:: Yes(no needs) Do you feel safe going back to the place where you live?: Yes      Need for Family Participation in Patient Care: Yes (Comment) Care giver support system in place?: Yes (comment)(Pt states her sons and spouse can provide all the assistance she needs) Current home services: DME(walker, wheelchair, shower seat, 3 in 1) Criminal Activity/Legal Involvement Pertinent to Current Situation/Hospitalization: No - Comment as needed  Activities of Daily Living Home Assistive Devices/Equipment: Wheelchair, Shower chair with back, Environmental consultant (specify type), Prosthesis(PROSTETIC LEGS BILATERAL) ADL Screening (condition at time of admission) Patient's cognitive ability adequate  to safely complete daily activities?: No Is the patient deaf or have difficulty hearing?: No Does the patient have difficulty seeing, even when wearing glasses/contacts?: No Does the patient have difficulty concentrating, remembering, or making decisions?: No Patient able to express need for assistance with ADLs?: Yes Does the patient have difficulty dressing or bathing?: No Independently performs ADLs?: Yes (appropriate for developmental age) Does the patient have difficulty walking or climbing stairs?: Yes Weakness of Legs: Both(BILATERAL AMPUTEE) Weakness of Arms/Hands: None  Permission Sought/Granted                  Emotional Assessment Appearance:: Appears stated age Attitude/Demeanor/Rapport: Engaged Affect (typically observed): Accepting, Appropriate, Pleasant Orientation: : Oriented to Self, Oriented to Place, Oriented to  Time, Oriented to Situation   Psych Involvement: No (comment)  Admission diagnosis:  Fall, initial encounter [W19.XXXA] Closed fracture of intertrochanteric section of femur, left, initial encounter [facility]) [S72.142A] Patient Active Problem List   Diagnosis Date Noted  . Closed intertrochanteric fracture of hip, left, initial encounter ([place]) 08/19/2018  . Acute congestive heart failure ([place]) 03/29/2018  . DOE (dyspnea on exertion) 03/29/2018  . S/P CABG x 4 03/29/2018  . Status post below-knee amputation of both lower extremities ([place]) 03/29/2018  . Acute cystitis 10/07/2016  . Pain medication agreement signed 10/06/2016  . Other chronic osteomyelitis, multiple sites ([place]) 07/29/2016  . Gastroesophageal reflux disease with esophagitis 07/09/2016  . CKD stage 3 due to type 2 diabetes  mellitus ([place]) 12/26/2015  . Rash 12/26/2015  . Bruit 10/29/2015  . Coronary artery disease 10/29/2015  . Murmur, cardiac 10/29/2015  . Anxiety 09/25/2015  . Chronic pain syndrome 09/25/2015  . High risk medication use 09/25/2015  . History of left lower extremity  amputation ([place]) 09/25/2015  . Major depressive disorder with single episode, in full remission ([place]) 09/25/2015  . Neuropathy 09/25/2015  . Localization-related symptomatic epilepsy and epileptic syndromes with complex partial seizures, not intractable, without status epilepticus ([place]) 06/27/2014  . Phantom limb pain ([place]) 06/27/2014  . Localization-related (focal) (partial) epilepsy and epileptic syndromes with complex partial seizures, with intractable epilepsy 10/30/2013  . Left frontal lobe mass 10/30/2013  . Status epilepticus ([place]) 09/02/2013  . Acute encephalopathy 09/02/2013  . Seizures ([place]) 08/29/2013  . Osteomyelitis ([place]) 08/21/2013  . Atherosclerosis of native arteries of the extremities with ulceration ([place]) 07/14/2013  . Protein-calorie malnutrition, severe ([place]) 06/20/2013  . Osteomyelitis of ankle or foot, left, acute ([place]) 06/17/2013  . Charcot foot due to diabetes mellitus ([place]) 05/13/2013  . PVD (peripheral vascular disease) ([place]) 03/25/2012  . Atherosclerosis of native artery of extremity with intermittent claudication ([place]) 09/16/2011  . Type 2 diabetes mellitus with diabetic mononeuropathy, with long-term current use of insulin ([place]) 03/14/2008  . Hyperlipidemia, unspecified 03/14/2008  . Anemia, normocytic normochromic 03/14/2008  . ANXIETY DEPRESSION 03/14/2008  . Essential hypertension 03/14/2008  . CAD, ARTERY BYPASS GRAFT 03/14/2008  . CAROTID STENOSIS 03/14/2008  . Right-sided carotid artery disease ([place]) 03/14/2008  . BRUIT 03/14/2008  . History of carotid stenosis 03/14/2008  . CAROTID ENDARTERECTOMY, RIGHT, HX OF 03/14/2008  . CHOLECYSTECTOMY, LAPAROSCOPIC, HX OF 03/14/2008   PCP:  [name], MD Pharmacy:   [id number] - [place], [place] [address] Phone: [phone] Fax: [phone]     Social Determinants of Health (SDOH) Interventions    Readmission Risk Interventions No  flowsheet data found.

## 2018-08-22 NOTE — Progress Notes (Signed)
SPORTS MEDICINE AND JOINT REPLACEMENT  Lara Mulch, MD    Carlyon Shadow, PA-C [place], [place], [place]  [address]                             [phone]   PROGRESS NOTE  Subjective:  negative for Chest Pain  negative for Shortness of Breath  negative for Nausea/Vomiting   negative for Calf Pain  negative for Bowel Movement   Tolerating Diet: yes         Patient reports pain as 3 on 0-10 scale.    Objective: Vital signs in last 24 hours:    Patient Vitals for the past 24 hrs:  BP Temp Temp src Pulse Resp SpO2  08/22/18 0516 (!) 146/62 98.9 F (37.2 C) Oral (!) 101 14 90 %  08/21/18 2043 (!) 128/47 99.1 F (37.3 C) Oral 100 15 (!) 88 %  08/21/18 1727 (!) 121/56 99.5 F (37.5 C) Oral 94 - 95 %  08/21/18 1157 (!) 132/95 - - 85 16 -  08/21/18 1100 (!) 80/50 - - - - -  08/21/18 1045 (!) 88/64 - - 95 - 99 %  08/21/18 0825 (!) 145/56 - - (!) 110 - 97 %  08/21/18 0657 (!) 110/56 99.4 F (37.4 C) Oral (!) 108 - 90 %    @flow {1959:LAST@   Intake/Output from previous day:   06/06 0701 - 06/07 0700 In: 1157.5 [P.O.:720; I.V.:337.5] Out: 300 [Urine:300]   Intake/Output this shift:   No intake/output data recorded.   Intake/Output      06/06 0701 - 06/07 0700   P.O. 720   I.V. (mL/kg) 337.5 (3.8)   IV Piggyback 100   Total Intake(mL/kg) 1157.5 (13.1)   Urine (mL/kg/hr) 300 (0.1)   Stool 0   Total Output 300   Net +857.5       Stool Occurrence 1 x      LABORATORY DATA: Recent Labs    08/19/18 1150 08/20/18 0459 08/20/18 1559 08/21/18 0217  WBC 5.3 5.7 9.7 8.3  HGB 9.2* 8.9* 7.9* 7.3*  HCT 28.7* 27.8* 24.8* 23.3*  PLT 221 211 237 219   Recent Labs    08/19/18 1150 08/20/18 0459 08/21/18 0217  NA 134* 135 135  K 3.9 3.6 3.7  CL 91* 93* 94*  CO2 29 30 27   BUN 30* 26* 27*  CREATININE 1.49* 1.53* 1.90*  GLUCOSE 378* 258* 229*  CALCIUM 9.3 8.9 8.5*   Lab Results  Component Value Date   INR 1.2 08/19/2018   INR 1.28 08/21/2013   INR  1.20 06/17/2013    Examination:  General appearance: alert, cooperative and no distress  Wound Exam: clean, dry, intact   Drainage:  None: wound tissue dry     Assessment:    2 Days Post-Op  Procedure(s) (LRB): INTRAMEDULLARY (IM) NAIL INTERTROCHANTRIC (Left)  ADDITIONAL DIAGNOSIS:  Principal Problem:   Closed intertrochanteric fracture of hip, left, initial encounter ([place]) Active Problems:   Type 2 diabetes mellitus with diabetic mononeuropathy, with long-term current use of insulin (HCC)   Hyperlipidemia, unspecified   Anemia, normocytic normochromic   ANXIETY DEPRESSION   Essential hypertension   CAD, ARTERY BYPASS GRAFT   PVD (peripheral vascular disease) (H[facility])   Phantom limb pain ([place])     Plan:  Patient doing well, resting at bedside,  and pain has improved. Ok to D/C per ortho standpoint once cleared by medicine. Will continue  to follow over the weekend   Donia Ast 08/22/2018, 6:25 AM

## 2018-08-23 ENCOUNTER — Encounter (HOSPITAL_COMMUNITY): Payer: Self-pay | Admitting: Student

## 2018-08-23 LAB — TYPE AND SCREEN
ABO/RH(D): A POS
Antibody Screen: NEGATIVE
Unit division: 0

## 2018-08-23 LAB — BPAM RBC
Blood Product Expiration Date: 202006182359
Unit Type and Rh: 6200

## 2019-04-19 ENCOUNTER — Other Ambulatory Visit: Payer: Self-pay

## 2019-04-19 ENCOUNTER — Encounter: Payer: Self-pay | Admitting: Neurology

## 2019-04-19 ENCOUNTER — Telehealth (INDEPENDENT_AMBULATORY_CARE_PROVIDER_SITE_OTHER): Payer: Medicare Other | Admitting: Neurology

## 2019-04-19 DIAGNOSIS — R413 Other amnesia: Secondary | ICD-10-CM | POA: Diagnosis not present

## 2019-04-19 DIAGNOSIS — G40209 Localization-related (focal) (partial) symptomatic epilepsy and epileptic syndromes with complex partial seizures, not intractable, without status epilepticus: Secondary | ICD-10-CM | POA: Diagnosis not present

## 2019-04-19 DIAGNOSIS — G546 Phantom limb syndrome with pain: Secondary | ICD-10-CM

## 2019-04-19 MED ORDER — PREGABALIN 50 MG PO CAPS: 50.0000 mg | ORAL_CAPSULE | Freq: Two times a day (BID) | ORAL | 3 refills | Status: DC

## 2019-04-19 MED ORDER — LEVETIRACETAM ER 750 MG PO TB24: 1500.0000 mg | ORAL_TABLET | Freq: Every day | ORAL | 3 refills | Status: DC

## 2019-04-19 NOTE — Progress Notes (Signed)
Virtual Visit via Video Note The purpose of this virtual visit is to provide medical care while limiting exposure to the novel coronavirus.    Consent was obtained for video visit:  Yes.   Answered questions that patient had about telehealth interaction:  Yes.   I discussed the limitations, risks, security and privacy concerns of performing an evaluation and management service by telemedicine. I also discussed with the patient that there may be a patient responsible charge related to this service. The patient expressed understanding and agreed to proceed.  Pt location: Home Physician Location: office Name of referring provider:  [name], MD I connected with [name] at patients initiation/request on 04/19/2019 at  3:00 PM EST by video enabled telemedicine application and verified that I am speaking with the correct person using two identifiers. Pt MRN:  [id number] Pt DOB:  [birth year] Video Participants:  [name]   History of Present Illness:  The patient was seen as a virtual video visit on 04/19/2019. She was last seen 8 months ago in the neurology clinic for seizures and phantom limb pain. Since her last visit, she continues to do well from a seizure-standpoint, seizure-free since June 2015 on Keppra XR 1500mg  qhs without side effects. She is s/p bilateral BKA with phantom limb pain, and takes Lyrica 50mg  BID. She fell last Mother's Day and sustained a left hip fracture, s/p surgery last June 2020. She still has some pain especially in the past few days with weather bad, pain is on the top of her legs. She takes 2 Excedrin every night. She reports gabapentin helped with the pain while in the hospital, but knocks her out. She does better with Lyrica. She has a dull headache on the vertex every so often, "pops up and goes away." Her main concern today is her memory, she is more forgetful and has word-finding difficulties. If she is interrupted in the middle of a sentence,  the thought is gone. She denies missing medications, her son Chance helps her fix her pillbox. She denies any missed bills. She does not drive. She reports being under a lot of stress.   HPI: This is a 64 yo RH woman with a history of diabetes, hypertension, hyperlipidemia, peripheral vascular disease s/p right carotid endarterectomy, CAD s/p CABG, and right BKA secondary to diabetes. She was admitted for left foot osteomyelitis due to poor healing wound after a fall last winter. She underwent left transtibial amputation on 08/22/13. She was discharged home on 06/11, then started having altered mental status on 06/15. Her son reports she had hit her head in the left frontal region when getting into their car, then started acting drowsy and out of it the next day. That evening, they were getting ready to eat when her right arm flexed and clenched into her chest followed by head turn to the left,stiffening and shaking for 1 minute as she fell to her left side. She was confused for 10 minutes, back to baseline within 30 minutes. She was brought to [facility] ER where BP was noted to be 205/87. She was given IV labetalol with slow improvement in BP. Blood sugar noted to be 303. Head CT showed low attenuation in the left corona radiata and centrum semiovale. She had an MRI brain initially without contrast which showed increased T2 and FLAIR signal in the left hemisphere from the anterior frontal cortex, supplemental motor area, extending down to the posterior limb of the internal capsule. She became agitated  and needed sedation for MRI brain with contrast, however again became restless with motion degradation, no abnormal enhancement seen. Left ICA flow void was noted to be absent, similar since 2009. It was felt that symptoms were not consistent with left MCA ischemia and concern was for viral CNS infection and received 4 days of empiric IV Acyclovir. She underwent a lumbar puncture which showed WBC 1, RBC 17, glucose 82,  protein 29, VDRL, CMV, HSV, arbovirus, gram stain and culture negative. No malignant cells on cytology. She was started on Keppra with no further seizures, discharged home on 06/18. She was apparently intermittently confused at home, woke up better then next day, then had another seizure that morning. Her son today reports she had a generalized convulsion with head deviation to left for 5 minutes. On arrival to the ER, per records, she was poorly responsive with right gaze deviation, nystagmoid eye movements. BP 197/80 She was given IV Ativan, according to her son she remained in this state for an hour, then was unresponsive for 36 hours after. Prolonged EEG showed diffuse slowing, no electrographic seizures or epileptiform discharges. Repeat MRI brain with and without contrast was again degraded by motion. Similar abnormal signal in the left frontal and parietal lobes again noted, predominantly in the centrum semiovale extending into the subcortical white matter. There is an area of questionable thickening in the left frontal cortex noted to be unchanged, no effacement of sulci in this area, no restricted diffusion, no abnormal enhancement although study is suboptimal. Chronically occluded left ICA again noted. She was discharged home on Keppra XR 1500mg /day. Repeat MRI 2 months later showed complete resolution of abnormal signal in the left frontal and parietal regions. There was mild chronic small vessel disease bilaterally. Per report, findings could have been due to cerebritis or conceivably due to venous thrombosis without infarction.   Of note, in 2009, she had a nocturnal convulsion and was admitted to [facility]. Records unavailable for review, according to her son she was not started on seizure medication due to first seizure. She had an MRI brain at that time which I personally reviewed, it was noted to be limited by motion and patient refusing to complete study, however on FLAIR sequences, white matter  changes in the left hemisphere are not present.   Epilepsy Risk Factors: Left frontal white matter lesion that has resolved. Otherwise she had a normal birth and early development. There is no history of febrile convulsions, significant traumatic brain injury, neurosurgical procedures, or family history of seizures.     Current Outpatient Medications on File Prior to Visit  Medication Sig Dispense Refill  . albuterol (VENTOLIN HFA) 108 (90 Base) MCG/ACT inhaler Inhale 1-2 puffs into the lungs every 6 (six) hours as needed for wheezing or shortness of breath.     Marland Kitchen amLODipine (NORVASC) 5 MG tablet Take 1 tablet (5 mg total) by mouth daily.    Marland Kitchen aspirin 81 MG tablet Take 1 tablet (81 mg total) by mouth daily. 90 tablet 0  . atorvastatin (LIPITOR) 80 MG tablet Take 1 tablet (80 mg total) by mouth daily. (Patient not taking: Reported on 04/18/2019) 90 tablet 0  . busPIRone (BUSPAR) 15 MG tablet Take 30 mg by mouth 2 (two) times daily as needed (anxiety).     . carvedilol (COREG) 25 MG tablet TAKE 1 TABLET BY MOUTH EVERY DAY (Patient taking differently: Take 25 mg by mouth daily. ) 90 tablet 0  . clopidogrel (PLAVIX) 75 MG tablet Take 1  tablet (75 mg total) by mouth daily. 90 tablet 0  . HYDROcodone-acetaminophen (NORCO/VICODIN) 5-325 MG tablet Take 1 tablet by mouth every 4 (four) hours as needed for moderate pain. (Patient not taking: Reported on 04/18/2019) 25 tablet 0  . LANTUS SOLOSTAR 100 UNIT/ML Solostar Pen Inject 70 Units into the skin daily. Taking in the evening    . Levetiracetam 750 MG TB24 Take 2 tablets (1,500 mg total) by mouth at bedtime.    . lidocaine (LIDODERM) 5 % Place 1 patch onto the skin daily. evening    . methocarbamol (ROBAXIN) 500 MG tablet Take 1 tablet (500 mg total) by mouth every 6 (six) hours as needed for muscle spasms. (Patient not taking: Reported on 04/18/2019) 30 tablet 0  . Multiple Vitamins-Minerals (MULTIVITAMINS THER. W/MINERALS) TABS Take 1 tablet by mouth daily.       Marland Kitchen olmesartan (BENICAR) 40 MG tablet Take 40 mg by mouth daily.    . polyethylene glycol (MIRALAX / GLYCOLAX) 17 g packet Take 17 g by mouth daily as needed for mild constipation. 14 each 0  . pregabalin (LYRICA) 50 MG capsule Take 1 capsule (50 mg total) by mouth 2 (two) times daily as needed (pain).    Marland Kitchen sertraline (ZOLOFT) 100 MG tablet Take 1 tablet (100 mg total) by mouth 2 (two) times daily. 180 tablet 0   No current facility-administered medications on file prior to visit.     Observations/Objective:   GEN:  The patient appears stated age and is in NAD.  Neurological examination: Patient is awake, alert, oriented x 3. No aphasia or dysarthria. Intact fluency and comprehension. Remote and recent memory intact. Able to name and repeat. Cranial nerves: Extraocular movements intact with no nystagmus. No facial asymmetry. Motor: moves all extremities symmetrically, at least anti-gravity x 4.   Assessment and Plan:   This is a 64 yo RH woman with a history of diabetes, hypertension, hyperlipidemia, peripheral vascular disease s/p right carotid endarterectomy, CAD s/p CABG, s/p bilateral BKA, nocturnal convulsion in 2009. She underwent left transtibial amputation on 08/22/13 then had a seizure on 08/25/13. MRI brain showed a lesion in the left frontal and parietal white matter, concerning for vasogenic edema. LP unremarkable, including cytology. Repeat MRI brain 2 months later showed complete resolution of abnormal signal. Considerations include cerebritis, venous thrombosis without infarction. She was noted to have hypertensive emergency with the first seizure, although asymmetric and more frontal, atypical PRES is another consideration, particularly with complete resolution on MRI. She has been seizure-free since June 2015, refills sent for Keppra XR 1500mg  daily. She has phantom limb pain with fair control on Lyrica 50mg  BID, refills sent. Her main concern today is forgetfulness and word-finding  difficulties, she will be schedule for Neuropsychological testing to further evaluate cognitive concerns. Follow-up in 8-12 months, she knows to call for any changes.    Follow Up Instructions:   -I discussed the assessment and treatment plan with the patient. The patient was provided an opportunity to ask questions and all were answered. The patient agreed with the plan and demonstrated an understanding of the instructions.   The patient was advised to call back or seek an in-person evaluation if the symptoms worsen or if the condition fails to improve as anticipated.     Cameron Sprang, MD

## 2019-09-13 ENCOUNTER — Ambulatory Visit: Payer: Medicare Other | Admitting: Psychology

## 2019-09-13 ENCOUNTER — Encounter: Payer: Self-pay | Admitting: Psychology

## 2019-09-13 ENCOUNTER — Ambulatory Visit (INDEPENDENT_AMBULATORY_CARE_PROVIDER_SITE_OTHER): Payer: Medicare Other | Admitting: Psychology

## 2019-09-13 ENCOUNTER — Other Ambulatory Visit: Payer: Self-pay

## 2019-09-13 DIAGNOSIS — F411 Generalized anxiety disorder: Secondary | ICD-10-CM | POA: Diagnosis not present

## 2019-09-13 DIAGNOSIS — I25810 Atherosclerosis of coronary artery bypass graft(s) without angina pectoris: Secondary | ICD-10-CM | POA: Diagnosis not present

## 2019-09-13 DIAGNOSIS — I10 Essential (primary) hypertension: Secondary | ICD-10-CM

## 2019-09-13 DIAGNOSIS — F410 Panic disorder [episodic paroxysmal anxiety] without agoraphobia: Secondary | ICD-10-CM | POA: Insufficient documentation

## 2019-09-13 DIAGNOSIS — F330 Major depressive disorder, recurrent, mild: Secondary | ICD-10-CM

## 2019-09-13 DIAGNOSIS — E1141 Type 2 diabetes mellitus with diabetic mononeuropathy: Secondary | ICD-10-CM | POA: Diagnosis not present

## 2019-09-13 DIAGNOSIS — Z794 Long term (current) use of insulin: Secondary | ICD-10-CM

## 2019-09-13 DIAGNOSIS — G40209 Localization-related (focal) (partial) symptomatic epilepsy and epileptic syndromes with complex partial seizures, not intractable, without status epilepticus: Secondary | ICD-10-CM

## 2019-09-13 DIAGNOSIS — R4189 Other symptoms and signs involving cognitive functions and awareness: Secondary | ICD-10-CM

## 2019-09-13 NOTE — Progress Notes (Signed)
NEUROPSYCHOLOGICAL EVALUATION [facility] [place] Department of Neurology  Date of Evaluation: September 13, 2019  Reason for Referral:   [name] is a 64 y.o. right-handed Caucasian female referred by Ellouise Newer, M.D., to characterize her current cognitive functioning and assist with diagnostic clarity and treatment planning in the context of subjective cognitive decline, history of seizure activity, and several medical and psychiatric comorbidities.   Assessment and Plan:   Clinical Impression(s): [name] pattern of performance is suggestive of neuropsychological functioning largely within expected normative ranges. Relative weaknesses were exhibited across certain aspects of executive functioning, namely response inhibition and problem solving/adaptability. Performance variability was also noted across several domains, including processing speed, attention/concentration, visuospatial abilities, and encoding (i.e., learning) and retrieval aspects of memory. However, scores were generally within normal limits. Performance was more consistently within normal limits across domains of cognitive flexibility, receptive and expressive language, and recognition/consolidation aspects of memory. [name] denied difficulties completing instrumental activities of daily living (ADLs) independently.  Regarding etiology, [name] has been diagnosed with a large number of medical ailments, including type II diabetes and several cardiovascular conditions (e.g., atherosclerosis, congestive heart failure, coronary artery disease, essential hypertension, hyperlipidemia, and peripheral vascular disease). It is possible that performance variability across testing and subjective day-to-day difficulties are most directly related to her numerous medical ailments and an underlying vascular etiology. While there were previous MRI findings suggesting abnormal signal in the left  frontal and parietal lobes, this seems to have spontaneously resolved. Should this have represented cerebritis stemming from an infection of some sort, current performance across testing would be reasonable, especially given relative weaknesses/variability across executive functioning and visuospatial manipulation. However, the results of the evaluation are not particularly lateralizing nor localizing overall. It is also possible that her positive seizure history could influence performance variability, especially across executive functioning. Psychiatric comorbidities can also complicated this picture; however, she reported only mild concerns surrounding anxiety and depression presently.   Specific to memory, despite some variability, [name] was able to learn novel verbal and visual information and retain this knowledge after lengthy delays. Overall, memory performance combined with intact performances across other areas of cognitive functioning is not suggestive of early concerns for Alzheimer's disease. Likewise, her cognitive and behavioral profile is not suggestive of another less common of neurodegenerative illness (e.g., Lewy body dementia or frontotemporal dementia) presently. Continued medical monitoring will be important moving forward.  Recommendations: Should [name] report increased concerns surrounding her cognition or exhibit functional decline in the future, a repeat neuropsychological evaluation would be warranted at that time. The current evaluation will serve as an excellent baseline to compare future evaluations against.   Should [name] be able to secure hand controls to operate a motorized vehicle, there are no cognitive contraindications to driving based on the present evaluation.  [name] is encouraged to attend to lifestyle factors for brain health (e.g., regular physical exercise, good nutrition habits, regular participation in cognitively-stimulating  activities, and general stress management techniques), which are likely to have benefits for both emotional adjustment and cognition. In fact, in addition to promoting good general health, regular exercise incorporating aerobic activities (e.g., brisk walking, jogging, cycling, etc.) has been demonstrated to be a very effective treatment for depression and stress, with similar efficacy rates to both antidepressant medication and psychotherapy. Optimal control of vascular risk factors (including safe cardiovascular exercise and adherence to dietary recommendations) is encouraged.  If interested, there are some activities which have  therapeutic value and can be useful in keeping her cognitively stimulated. For suggestions, [name] is encouraged to go to the following website: https://www.[facility].org/get-to-know-Foster/centers-programs/neurorehabilitation-center/neuro-rehab-apps-and-games/ which has options, categorized by level of difficulty. It should be noted that these activities should not be viewed as a substitute for therapy.  When learning new information, she would benefit from information being broken up into small, manageable pieces. She may also find it helpful to articulate the material in her own words and in a context to promote encoding at the onset of a new task. This material may need to be repeated multiple times to promote encoding.  Memory can be improved using internal strategies such as rehearsal, repetition, chunking, mnemonics, association, and imagery. External strategies such as written notes in a consistently used memory journal, visual and nonverbal auditory cues such as a calendar on the refrigerator or appointments with alarm, such as on a cell phone, can also help maximize recall.    To address problems with processing speed, she may wish to consider:   -Ensuring that she is alerted when essential material or instructions are being presented   -Allowing additional  processing time or a chance to rehearse novel information   -Allowing for more time in comprehending, processing, and responding in conversation  To address problems with fluctuating attention, she may wish to consider:   -Avoiding external distractions when needing to concentrate   -Limiting exposure to fast paced environments with multiple sensory demands   -Writing down complicated information and using checklists   -Attempting and completing one task at a time (i.e., no multi-tasking)   -Verbalizing aloud each step of a task to maintain focus   -Reducing the amount of information considered at one time  Review of Records:   [name]. Arey was seen by [facility] Ellouise Newer, M.D.) on 04/19/2019 for follow-up of seizures and phantom limb pain. Briefly, in 2009, she had a nocturnal convulsion and was admitted to [facility]. Records unavailable for review but according to her son she was not started on seizure medication due to this representing her first seizure. Additionally, she was admitted for left foot osteomyelitis due to poor wound healing after a fall last winter. She underwent left transtibial amputation on 08/22/2013. She was discharged home on 08/25/2013 and started having altered mental status on 08/29/2013. Her son reported that she hit her head in the left frontal region when getting into their car and started acting drowsy and out of it the next day. That evening, they were getting ready to eat when her right arm flexed and clenched into her chest followed by head turn to the left. Stiffening and shaking was observed for one minute as she fell to her left side. She was confused for approximately 10 minutes and was back to baseline within 30 minutes. She was brought to [facility] ER where her BP was noted to be 205/87. She was given IV labetalol. Blood sugar was noted to be 303. Head CT showed low attenuation in the left corona radiata and centrum semiovale. She had a brain MRI initially without  contrast which showed increased T2 and FLAIR signal in the left hemisphere from the anterior frontal cortex, supplemental motor area, extending down to the posterior limb of the internal capsule. She became agitated and needed sedation for brain MRI with contrast. However, she again became restless with motion degradation. No abnormal enhancement seen. Left ICA flow void was noted to be absent, similar since 2009. It was felt that symptoms were not consistent  with left MCA ischemia and concern was for viral CNS infection. She received 4 days of empiric IV Acyclovir. She underwent a lumbar puncture which showed WBC 1, RBC 17, glucose 82, protein 29, VDRL, CMV, HSV, arbovirus, gram stain and culture negative. No malignant cells were observed on cytology. She was started on Keppra with no further seizures and was discharged home on 09/01/2013. She was apparently intermittently confused at home, woke up better then next day, then had another seizure that morning. Her son reported that she had a generalized convulsion with head deviation to left for approximately five minutes. Per records, on arrival to the ER, she was poorly responsive with right gaze deviation and exhibited nystagmoid eye movements. BP was 197/80. She was given IV Ativan. According to her son, she remained in this state for an hour, then was unresponsive for 36 hours after. Prolonged EEG showed diffuse slowing but no electrographic seizures or epileptiform discharges. Repeat brain MRI with and without contrast was again degraded by motion. Similar abnormal signal in the left frontal and parietal lobes was noted, predominantly in the centrum semiovale extending into the subcortical white matter. There was an area of questionable thickening in the left frontal cortex noted to be unchanged. There was no effacement of sulci in this area, no restricted diffusion, and no abnormal enhancement although the study was suboptimal. Chronically occluded left ICA was  again noted. She was discharged home on Keppra XR 1500mg /day. Repeat brain MRI two months later showed complete resolution of abnormal signal in the left frontal and parietal regions. There was mild chronic small vessel disease bilaterally. Per report, findings could have been due to cerebritis or conceivably due to venous thrombosis without infarction.  When meeting with Dr. Delice Lesch on 04/19/2019, she noted that [name]. Scalera continues to do well from a seizure standpoint with no further episodes since June 2015. She is s/p bilateral BKA with phantom limb pain and takes Lyrica 50mg  BID. She fell last Mother's Day and sustained a left hip fracture, undergoing surgery in June 2020. She reported residual pain especially in the past few days with bad weather. She reported experiencing a dull headache on the vertex every so often ("pops up and goes away"). Her main concern at that time was short-term memory difficulties (i.e., she is more forgetful and has word-finding difficulties). She also noted losing her train of thought if interrupted in the middle of a sentence. She denied missing medications or missed bills. She does not drive. Ultimately, [name]. Vanhoesen was referred for a comprehensive neuropsychological evaluation to characterize her cognitive abilities and to assist with diagnostic clarity and treatment planning.   Past Medical History:  Diagnosis Date  . Acute congestive heart failure 03/29/2018  . Acute encephalopathy 09/02/2013  . Anemia, normocytic normochromic 03/14/2008  . Arthritis    "hands"  . Atherosclerosis of native arteries of the extremities with ulceration 07/14/2013  . Atherosclerosis of native artery of extremity with intermittent claudication 09/16/2011  . CAD (coronary artery disease) of artery bypass graft 03/14/2008  . Chronic kidney disease (CKD) 12/26/2015   stage 3 due to type 2 diabetes mellitus  . Chronic pain syndrome 09/25/2015  . Complication of anesthesia    pt felt like  she had a hard time waking up after surgery in Feb. 2015.  Marland Kitchen Coronary artery disease involving native coronary artery without angina pectoris 09/25/2015  . DOE (dyspnea on exertion) 03/29/2018  . Essential hypertension 03/14/2008  . Gastroesophageal reflux disease with esophagitis 07/09/2016  .  Generalized anxiety disorder with panic attacks    History of one panic attack  . GERD (gastroesophageal reflux disease)    takes Zantac  . History of blood transfusion    "one time; related to diabetes I guess"   . History of carotid endarterectomy, right 62/13/0865   complicated by cranial nerve XII injury leading to right  sided tongue deviation  . Hyperlipidemia, unspecified 03/14/2008  . Localization-related symptomatic epilepsy and epileptic syndromes with complex partial seizures, not intractable, without status epilepticus 06/27/2014  . Major depressive disorder 03/14/2008  . Neuropathy   . Osteomyelitis of ankle or foot, left, acute 06/17/2013  . Peripheral vascular disease   . Phantom limb pain 06/27/2014  . PVD (peripheral vascular disease) 03/25/2012  . S/P below-knee amputation of both lower extremities 03/29/2018  . S/P CABG x 4 03/29/2018  . Type 2 diabetes mellitus with diabetic mononeuropathy, with long-term current use of insulin 03/14/2008    Past Surgical History:  Procedure Laterality Date  . AMPUTATION Left 08/22/2013   Procedure: LEFT BELOW KNEE AMPUTATION;  Surgeon: Newt Minion, MD;  Location: [place];  Service: Orthopedics;  Laterality: Left;  . ANKLE FUSION Left 05/11/2013   TIBIOCALCANEAL FUSION   . ANKLE FUSION Left 05/12/2013   Procedure: LEFT TIBIOCALCANEAL FUSION;  Surgeon: Newt Minion, MD;  Location: [place];  Service: Orthopedics;  Laterality: Left;  Left Tibiocalcaneal Fusion  . APPLICATION OF WOUND VAC  06/17/2013   Procedure: APPLICATION OF WOUND VAC;  Surgeon: Newt Minion, MD;  Location: [place];  Service: Orthopedics;;  . BELOW KNEE LEG AMPUTATION Right Jan. 2008  .  CAROTID ENDARTERECTOMY Right   . CATARACT EXTRACTION Left 12/25/14  . CHOLECYSTECTOMY    . COLONOSCOPY    . CORONARY ARTERY BYPASS GRAFT  12/01/2007   "CABG X4" (05/12/2013)  . DILATION AND CURETTAGE OF UTERUS    . I & D EXTREMITY Left 06/17/2013   Procedure: IRRIGATION AND DEBRIDEMENT EXTREMITY, PLACEMENT ANTIBIOTIC STIMULAN BEADS;  Surgeon: Newt Minion, MD;  Location: [place];  Service: Orthopedics;  Laterality: Left;  . INTRAMEDULLARY (IM) NAIL INTERTROCHANTERIC Left 08/20/2018   Procedure: INTRAMEDULLARY (IM) NAIL INTERTROCHANTRIC;  Surgeon: Shona Needles, MD;  Location: [place];  Service: Orthopedics;  Laterality: Left;  . IRRIGATION AND DEBRIDEMENT ABSCESS Left 06/17/2013   ANKLE           DR [name]  . TOE AMPUTATION Right    "took off a couple toes before the amputation"  . TONSILLECTOMY      Current Outpatient Medications:  .  albuterol (VENTOLIN HFA) 108 (90 Base) MCG/ACT inhaler, Inhale 1-2 puffs into the lungs every 6 (six) hours as needed for wheezing or shortness of breath. , Disp: , Rfl:  .  amLODipine (NORVASC) 5 MG tablet, Take 1 tablet (5 mg total) by mouth daily., Disp: , Rfl:  .  aspirin 81 MG tablet, Take 1 tablet (81 mg total) by mouth daily., Disp: 90 tablet, Rfl: 0 .  atorvastatin (LIPITOR) 80 MG tablet, Take 1 tablet (80 mg total) by mouth daily. (Patient not taking: Reported on 04/18/2019), Disp: 90 tablet, Rfl: 0 .  busPIRone (BUSPAR) 15 MG tablet, Take 30 mg by mouth 2 (two) times daily as needed (anxiety). , Disp: , Rfl:  .  carvedilol (COREG) 25 MG tablet, TAKE 1 TABLET BY MOUTH EVERY DAY (Patient taking differently: Take 25 mg by mouth daily. ), Disp: 90 tablet, Rfl: 0 .  clopidogrel (PLAVIX) 75 MG tablet,  Take 1 tablet (75 mg total) by mouth daily., Disp: 90 tablet, Rfl: 0 .  LANTUS SOLOSTAR 100 UNIT/ML Solostar Pen, Inject 70 Units into the skin daily. Taking in the evening, Disp: , Rfl:  .  Levetiracetam 750 MG TB24, Take 2 tablets (1,500 mg total) by mouth at  bedtime., Disp: 180 tablet, Rfl: 3 .  lidocaine (LIDODERM) 5 %, Place 1 patch onto the skin daily. evening, Disp: , Rfl:  .  methocarbamol (ROBAXIN) 500 MG tablet, Take 1 tablet (500 mg total) by mouth every 6 (six) hours as needed for muscle spasms. (Patient not taking: Reported on 04/18/2019), Disp: 30 tablet, Rfl: 0 .  Multiple Vitamins-Minerals (MULTIVITAMINS THER. W/MINERALS) TABS, Take 1 tablet by mouth daily.  , Disp: , Rfl:  .  olmesartan (BENICAR) 40 MG tablet, Take 40 mg by mouth daily., Disp: , Rfl:  .  polyethylene glycol (MIRALAX / GLYCOLAX) 17 g packet, Take 17 g by mouth daily as needed for mild constipation., Disp: 14 each, Rfl: 0 .  pregabalin (LYRICA) 50 MG capsule, Take 1 capsule (50 mg total) by mouth 2 (two) times daily., Disp: 180 capsule, Rfl: 3 .  sertraline (ZOLOFT) 100 MG tablet, Take 1 tablet (100 mg total) by mouth 2 (two) times daily., Disp: 180 tablet, Rfl: 0  Clinical Interview:   Cognitive Symptoms: Decreased short-term memory: Endorsed. She primarily endorsed trouble losing her train of thought. She denied prominent difficulties remembering the details of previous conversations, remembering the names of familiar individuals, or misplacing objects in her environment. Deficits were said to be present for six months to a year and have seemed relatively stable. Her son reported that memory concerns have been around for longer than a year but did not disagree with their reported stability.  Decreased long-term memory: Denied. Decreased attention/concentration: Endorsed. She described noticing a much shorter attention span, as well as some trouble with increased distractibility. This was said to follow a similar timeline as short-term memory dysfunction.  Reduced processing speed: Denied. Difficulties with executive functions: Denied. She acknowledged some trouble with organization, but noted that this was a result of her getting sick and having to rely on others for their  assistance. She reported high levels of organization for the things she has direct control over. She denied trouble with impulsivity, decision making/judgment, or overt personality changes.  Difficulties with emotion regulation: Denied. Difficulties with receptive language: Denied assuming she can hear the source of the sound adequately.  Difficulties with word finding: Denied. Decreased visuoperceptual ability: Denied.  Difficulties completing ADLs: Denied. She reported organizing her medications and personal finances appropriately. She does not currently drive due to her history of bilateral lower extremity amputations. However, she has had discussions surrounding installing hand controls in her vehicle to allow her to drive and regain some independence.   Additional Medical History: History of traumatic brain injury/concussion: Denied. History of stroke: Denied. History of seizure activity: Endorsed (see above). She confirmed that no seizure activity has been observed since June 2015.  History of known exposure to toxins: Denied. Symptoms of chronic pain: Endorsed. She reported a history of neuropathy and ongoing phantom limb pain surrounding the location of her amputations.  Experience of frequent headaches/migraines: Denied. Frequent instances of dizziness/vertigo: Denied.  Sensory changes: She wears glasses with positive effect. She alluded to mild hearing loss but has not undergone a formal evaluation. Other sensory changes/difficulties (i.e., taste or smell) were denied.  Balance/coordination difficulties: [name] currently ambulates via wheelchair since her amputations.  Records do suggest a fall in the early Summer of 2020 which resulted in a hip fracture.  Other motor difficulties: Denied.  Sleep History: Estimated hours obtained each night: 6-8 hours. Notably, she reported a history of working 2nd shift in the past. As such, she continues to have an abnormal sleep pattern of  sleeping during the day and generally being awake at night.  Difficulties falling asleep: Denied. Difficulties staying asleep: Denied. Feels rested and refreshed upon awakening: Endorsed.  History of snoring: Unknown.  History of waking up gasping for air: Denied. Witnessed breath cessation while asleep: Denied.  History of vivid dreaming: Denied. Excessive movement while asleep: Denied. Instances of acting out her dreams: Denied.  Psychiatric/Behavioral Health History: Depression: Endorsed. She reported a longstanding history of depression dating back "all my life." She reported utilizing mood-related medications with positive effect. She denied prior involvement with an individual therapist or counselor. She likewise denied current or remote suicidal ideation, intent, or plan.  Anxiety: Endorsed. Her son reported ongoing symptoms of generalized anxiety, generally in the form of rumination and worrisome thoughts. She eventually agreed with this, reporting a history of a single panic attack where she experienced shortness of breath and chest pain. These symptoms were said to be largely well managed currently.  Mania: Denied. Trauma History: Denied. Visual/auditory hallucinations: Denied. Delusional thoughts: Denied.  Tobacco: Denied. Alcohol: She denied current alcohol consumption as well as a history of problematic alcohol abuse or dependence.  Recreational drugs: Denied. Caffeine: She reported consuming a daily soda but is working to diminish the frequency of consuming these beverages.   Family History: Problem Relation Age of [place]  . Cancer Mother   . Heart disease Father   . Diabetes Father   . Hyperlipidemia Father   . Hypertension Father   . Alzheimer's disease Maternal Grandfather    This information was confirmed by [name]. Swinger.  Academic/Vocational History: Highest level of educational attainment: 14 years. 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 She described herself as a strong (A/B) student in academic settings. Math was noted as a relative weakness.  History of developmental delay: Denied. History of grade repetition: Denied. Enrollment in special education courses: Denied. History of LD/ADHD: Denied.  Employment: She currently receives disability benefits given her history of bilateral lower extremity amputations. Previously, she worked as a Marine scientist.   Evaluation Results:   Behavioral Observations: [name] was accompanied by her son, arrived to her appointment on time, and was appropriately dressed and groomed. She appeared alert and oriented. She ambulated via a wheelchair pushed by her son. Gross motor functioning appeared intact upon informal observation and no abnormal movements (e.g., tremors) were noted. Her affect was generally relaxed and positive, but did range appropriately given the subject being discussed during the clinical interview or the task at hand during testing procedures. Spontaneous speech was fluent and word finding difficulties were not observed during the clinical interview. Thought processes were coherent, organized, and normal in content. Insight into her cognitive difficulties appeared adequate. During testing, sustained attention was appropriate. Task engagement was adequate and she persisted when challenged. She did appear to fatigue as the evaluation progressed. Overall, [name] was cooperative with the clinical interview and subsequent testing procedures.   Adequacy of Effort: The validity of neuropsychological testing is limited by the extent to which the individual being tested may be assumed to have exerted adequate effort during testing. [name] expressed her intention to perform to the  best of her abilities and exhibited adequate task engagement and persistence. Scores across stand-alone and embedded performance validity measures were within expectation. As such, the results  of the current evaluation are believed to be a valid representation of [name]'s current cognitive functioning.  Test Results: [name] was fully oriented at the time of the current evaluation.  Intellectual abilities based upon educational and vocational attainment were estimated to be in the average range. Premorbid abilities were estimated to be within the below average range based upon a single-word reading test.   Processing speed was generally average. Performance was in the well below average range across a rapid decoding task (WAIS Coding). Basic attention was exceptionally low. More complex attention (e.g., working memory) was average. Executive functioning was largely below average to average. Response inhibition represented a main weakness across this domain.  Assessed receptive language abilities were above average. Likewise, [name] did not exhibit any difficulties comprehending task instructions and answered all questions asked of her appropriately. Sentence repetition was average, while performance on a semantic knowledge screening test was within expectation. Assessed expressive language (e.g., verbal fluency and confrontation naming) was somewhat variable. Phonemic fluency was well below average to below average, semantic fluency was average, and confrontation naming was average.   Assessed visuospatial/visuoconstructional abilities were below average to above average, with a relative weakness noted on spatial manipulation relative to constructional abilities.    Learning (i.e., encoding) of novel verbal and visual information was well below average to average with a relative weakness across visual encoding. Spontaneous delayed recall (i.e., retrieval) of previously learned information was below average to above average. Retention rates were 113% across a story learning task, 86% across a list learning task, and 175% across a shape learning task. Performance across  recognition tasks was average to above average, suggesting evidence for information consolidation.   Results of emotional screening instruments suggested that recent symptoms of generalized anxiety were in the mild range, while symptoms of depression were also within the mild range. A screening instrument assessing recent sleep quality suggested the presence of minimal sleep dysfunction.  Tables of Scores:   Note: This summary of test scores accompanies the interpretive report and should not be considered in isolation without reference to the appropriate sections in the text. Descriptors are based on appropriate normative data and may be adjusted based on clinical judgment. The terms "impaired" and "within normal limits (WNL)" are used when a more specific level of functioning cannot be determined.       Effort Testing:   DESCRIPTOR       ACS Word Choice: --- --- Within Expectation  D-KEFS Color Word Effort Index: --- --- Within Expectation       Orientation:      Raw Score Percentile   NAB Orientation, Form 1 29/29 --- ---       Intellectual Functioning:           Standard Score Percentile   Test of Premorbid Functioning: 83 13 Below Average       Memory:          NAB Memory Module, Form 2: Standard Score/ T Score Percentile   Total Memory Index 93 32 Average  List Learning       Total Trials 1-3 21/36 (43) 25 Average    List B 3/12 (40) 16 Below Average    Short Delay Free Recall 7/12 (44) 27 Average    Long Delay Free Recall 6/12 (42) 21 Below Average  Retention Percentage 86 (48) 42 Average    Recognition Discriminability 8 (47) 38 Average  Shape Learning       Total Trials 1-3 11/27 (36) 8 Well Below Average    Delayed Recall 7/9 (60) 84 Above Average    Retention Percentage 175 (70) 98 Exceptionally High    Recognition Discriminability 8 (55) 69 Average  Story Learning       Immediate Recall 42/80 (42) 21 Below Average    Delayed Recall 27/40 (51) 54 Average     Retention Percentage 113 (61) 86 Above Average  Daily Living Memory       Immediate Recall 43/51 (48) 42 Average    Delayed Recall 16/17 (60) 84 Above Average    Retention Percentage 94 (55) 69 Average    Recognition Hits 10/10 (59) 82 Above Average       Attention/Executive Function:          Trail Making Test (TMT): Raw Score (T Score) Percentile     Part A 35 secs.,  0 errors (45) 31 Average    Part B 103 secs.,  3 errors (42) 21 Below Average         Scaled Score Percentile   WAIS-IV Coding: 5 5 Well Below Average       NAB Attention Module, Form 1: T Score Percentile     Digits Forward 29 2 Exceptionally Low    Digits Backwards 48 42 Average       D-KEFS Color-Word Interference Test: Raw Score (Scaled Score) Percentile     Color Naming 31 secs. (11) 63 Average    Word Reading 24 secs. (10) 50 Average    Inhibition 86 secs. (7) 16 Below Average      Total Errors 10 errors (1) <1 Exceptionally Low    Inhibition/Switching 89 secs. (8) 25 Average      Total Errors 11 errors (2) <1 Exceptionally Low       D-KEFS Verbal Fluency Test: Raw Score (Scaled Score) Percentile     Letter Total Correct 21 (6) 9 Below Average    Category Total Correct 37 (11) 63 Average    Category Switching Total Correct 11 (8) 25 Average    Category Switching Accuracy 10 (9) 37 Average      Total Set Loss Errors 3 (9) 37 Average      Total Repetition Errors 1 (12) 75 Above Average       [employer] Test: Raw Score Percentile     Categories (trials) 1 (64) 6-10 Well Below Average    Total Errors 29 9 Below Average    Perseverative Errors 16 14 Below Average    Non-Perseverative Errors 13 14 Below Average    Failure to Maintain Set 0 --- ---       Language:           Raw Score Percentile   PPVT Screening Instrument: 13/16 --- Within Expectation  Sentence Repetition: 14/22 27 Average       Verbal Fluency Test: Raw Score (T Score) Percentile     Phonemic Fluency (FAS) 21 (30) 2  Well Below Average    Animal Fluency 17 (45) 31 Average        NAB Language Module, Form 1: T Score Percentile     Auditory Comprehension 57 75 Above Average    Naming 31/31 (56) 73 Average       Visuospatial/Visuoconstruction:      Raw Score Percentile   Clock Drawing:  10/10 --- Within Normal Limits       NAB Spatial Module, Form 1: T Score Percentile     Figure Drawing Copy 58 79 Above Average        Scaled Score Percentile   WAIS-IV Block Design: 6 9 Below Average       Mood and Personality:      Raw Score Percentile   Geriatric Depression Scale: 10 --- Mild  Geriatric Anxiety Scale: 12 --- Mild    Somatic 6 --- Mild    Cognitive 2 --- Minimal    Affective 4 --- Mild       Additional Questionnaires:      Raw Score Percentile   PROMIS Sleep Disturbance Questionnaire: 10 --- None to Slight   Informed Consent and Coding/Compliance:   [name]. Randle was provided with a verbal description of the nature and purpose of the present neuropsychological evaluation. Also reviewed were the foreseeable risks and/or discomforts and benefits of the procedure, limits of confidentiality, and mandatory reporting requirements of this provider. The patient was given the opportunity to ask questions and receive answers about the evaluation. Oral consent to participate was provided by the patient.   This evaluation was conducted by Christia Reading, Ph.D., licensed clinical neuropsychologist. [name]. Lohmann completed a comprehensive clinical interview with Dr. Melvyn Novas, billed as one unit [phone], and 150 minutes of cognitive testing and scoring, billed as one unit [phone] and four additional units 96139. Psychometrist Milana Kidney, B.S., assisted Dr. Melvyn Novas with test administration and scoring procedures. As a separate and discrete service, Dr. Melvyn Novas spent a total of 180 minutes in interpretation and report writing billed as one unit [phone] and two units 96133.

## 2019-09-13 NOTE — Patient Instructions (Signed)
Clinical Impression(s): [name] pattern of performance is suggestive of neuropsychological functioning largely within expected normative ranges. Relative weaknesses were exhibited across certain aspects of executive functioning, namely response inhibition and problem solving/adaptability. Performance variability was also noted across several domains, including processing speed, attention/concentration, visuospatial abilities, and encoding (i.e., learning) and retrieval aspects of memory. However, scores were generally within normal limits. Performance was more consistently within normal limits across domains of cognitive flexibility, receptive and expressive language, and recognition/consolidation aspects of memory. [name] denied difficulties completing instrumental activities of daily living (ADLs) independently.  Regarding etiology, [name] has been diagnosed with a large number of medical ailments, including type II diabetes and several cardiovascular conditions (e.g., atherosclerosis, congestive heart failure, coronary artery disease, essential hypertension, hyperlipidemia, and peripheral vascular disease). It is possible that performance variability across testing and subjective day-to-day difficulties are most directly related to her numerous medical ailments and an underlying vascular etiology. While there were previous MRI findings suggesting abnormal signal in the left frontal and parietal lobes, this seems to have spontaneously resolved. Should this have represented cerebritis stemming from an infection of some sort, current performance across testing would be reasonable, especially given relative weaknesses/variability across executive functioning and visuospatial manipulation. However, the results of the evaluation are not particularly lateralizing nor localizing overall. It is also possible that her positive seizure history could influence performance variability, especially across  executive functioning. Psychiatric comorbidities can also complicated this picture; however, she reported only mild concerns surrounding anxiety and depression presently.   Specific to memory, despite some variability, [name] was able to learn novel verbal and visual information and retain this knowledge after lengthy delays. Overall, memory performance combined with intact performances across other areas of cognitive functioning is not suggestive of early concerns for Alzheimer's disease. Likewise, her cognitive and behavioral profile is not suggestive of another less common of neurodegenerative illness (e.g., Lewy body dementia or frontotemporal dementia) presently. Continued medical monitoring will be important moving forward.

## 2019-09-13 NOTE — Progress Notes (Signed)
   Psychometrician Note   Cognitive testing was administered to [name] by Milana Kidney, B.S. (psychometrist) under the supervision of Dr. Christia Reading, Ph.D., licensed psychologist on 09/13/19. Ms. Armentor did not appear overtly distressed by the testing session per behavioral observation or responses across self-report questionnaires. Dr. Christia Reading, Ph.D. checked in with Ms. Speights as needed to manage any distress related to testing procedures (if applicable). Rest breaks were offered.    The battery of tests administered was selected by Dr. Christia Reading, Ph.D. with consideration to Ms. Hochstatter's current level of functioning, the nature of her symptoms, emotional and behavioral responses during interview, level of literacy, observed level of motivation/effort, and the nature of the referral question. This battery was communicated to the psychometrist. Communication between Dr. Christia Reading, Ph.D. and the psychometrist was ongoing throughout the evaluation and Dr. Christia Reading, Ph.D. was immediately accessible at all times. Dr. Christia Reading, Ph.D. provided supervision to the psychometrist on the date of this service to the extent necessary to assure the quality of all services provided.    PALLAS WAHLERT will return within approximately 1-2 weeks for an interactive feedback session with Dr. Melvyn Novas at which time her test performances, clinical impressions, and treatment recommendations will be reviewed in detail. Ms. Fleites understands she can contact our office should she require our assistance before this time.  A total of 150 minutes of billable time were spent face-to-face with Ms. Stickler by the psychometrist. This includes both test administration and scoring time. Billing for these services is reflected in the clinical report generated by Dr. Christia Reading, Ph.D..  This note reflects time spent with the psychometrician and does not include test scores or  any clinical interpretations made by Dr. Melvyn Novas. The full report will follow in a separate note.

## 2019-09-21 ENCOUNTER — Ambulatory Visit (INDEPENDENT_AMBULATORY_CARE_PROVIDER_SITE_OTHER): Payer: Medicare Other | Admitting: Psychology

## 2019-09-21 ENCOUNTER — Other Ambulatory Visit: Payer: Self-pay

## 2019-09-21 DIAGNOSIS — R4189 Other symptoms and signs involving cognitive functions and awareness: Secondary | ICD-10-CM

## 2019-09-21 DIAGNOSIS — E1141 Type 2 diabetes mellitus with diabetic mononeuropathy: Secondary | ICD-10-CM

## 2019-09-21 DIAGNOSIS — R569 Unspecified convulsions: Secondary | ICD-10-CM

## 2019-09-21 DIAGNOSIS — Z794 Long term (current) use of insulin: Secondary | ICD-10-CM

## 2019-09-21 NOTE — Progress Notes (Signed)
   Neuropsychology Feedback Session [facility]. [facility]  Reason for Referral:   [name] a 63 y.o. right-handed Caucasian female referred by Ellouise Newer, M.D.,to characterize hercurrent cognitive functioning and assist with diagnostic clarity and treatment planning in the context of subjective cognitive decline, history of seizure activity, and several medical and psychiatric comorbidities.   Feedback:   [name] completed a comprehensive neuropsychological evaluation on 09/13/2019. Please refer to that encounter for the full report and recommendations. Briefly, results suggested neuropsychological functioning largely within expected normative ranges. Relative weaknesses were exhibited across certain aspects of executive functioning, namely response inhibition and problem solving/adaptability. Performance variability was also noted across several domains, including processing speed, attention/concentration, visuospatial abilities, and encoding (i.e., learning) and retrieval aspects of memory. However, scores were generally within normal limits. Regarding etiology, [name] has been diagnosed with a large number of medical ailments, including type II diabetes and several cardiovascular conditions (e.g., atherosclerosis, congestive heart failure, coronary artery disease, essential hypertension, hyperlipidemia, and peripheral vascular disease). It is possible that performance variability across testing and subjective day-to-day difficulties are most directly related to her numerous medical ailments and an underlying vascular etiology.   [name] was unaccompanied during the current telephone call. She was at her residence while I was within my office. Content of the current session focused on the results of her neuropsychological evaluation. [name] was given the opportunity to ask questions and her questions were answered. She was  encouraged to reach out should additional questions arise. A copy of her report was mailed at the conclusion of the visit.      Less than 16 minutes were spent conducting the current feedback session with [name].

## 2019-09-21 NOTE — Patient Instructions (Signed)
Recommendations: Should [name] report increased concerns surrounding her cognition or exhibit functional decline in the future, a repeat neuropsychological evaluation would be warranted at that time. The current evaluation will serve as an excellent baseline to compare future evaluations against.   Should [name] be able to secure hand controls to operate a motorized vehicle, there are no cognitive contraindications to driving based on the present evaluation.  [name] is encouraged to attend to lifestyle factors for brain health (e.g., regular physical exercise, good nutrition habits, regular participation in cognitively-stimulating activities, and general stress management techniques), which are likely to have benefits for both emotional adjustment and cognition. In fact, in addition to promoting good general health, regular exercise incorporating aerobic activities (e.g., brisk walking, jogging, cycling, etc.) has been demonstrated to be a very effective treatment for depression and stress, with similar efficacy rates to both antidepressant medication and psychotherapy. Optimal control of vascular risk factors (including safe cardiovascular exercise and adherence to dietary recommendations) is encouraged.  If interested, there are some activities which have therapeutic value and can be useful in keeping her cognitively stimulated. For suggestions, [name] is encouraged to go to the following website: https://www.[facility].org/get-to-know-barrow/centers-programs/neurorehabilitation-center/neuro-rehab-apps-and-games/ which has options, categorized by level of difficulty. It should be noted that these activities should not be viewed as a substitute for therapy.  When learning new information, she would benefit from information being broken up into small, manageable pieces. She may also find it helpful to articulate the material in her own words and in a context to promote encoding  at the onset of a new task. This material may need to be repeated multiple times to promote encoding.  Memory can be improved using internal strategies such as rehearsal, repetition, chunking, mnemonics, association, and imagery. External strategies such as written notes in a consistently used memory journal, visual and nonverbal auditory cues such as a calendar on the refrigerator or appointments with alarm, such as on a cell phone, can also help maximize recall.    To address problems with processing speed, she may wish to consider:   -Ensuring that she is alerted when essential material or instructions are being presented   -Allowing additional processing time or a chance to rehearse novel information   -Allowing for more time in comprehending, processing, and responding in conversation  To address problems with fluctuating attention, she may wish to consider:   -Avoiding external distractions when needing to concentrate   -Limiting exposure to fast paced environments with multiple sensory demands   -Writing down complicated information and using checklists   -Attempting and completing one task at a time (i.e., no multi-tasking)   -Verbalizing aloud each step of a task to maintain focus   -Reducing the amount of information considered at one time

## 2019-11-15 ENCOUNTER — Other Ambulatory Visit: Payer: Self-pay

## 2019-11-15 ENCOUNTER — Telehealth (INDEPENDENT_AMBULATORY_CARE_PROVIDER_SITE_OTHER): Payer: Medicare Other | Admitting: Neurology

## 2019-11-15 ENCOUNTER — Encounter: Payer: Self-pay | Admitting: Neurology

## 2019-11-15 DIAGNOSIS — G40209 Localization-related (focal) (partial) symptomatic epilepsy and epileptic syndromes with complex partial seizures, not intractable, without status epilepticus: Secondary | ICD-10-CM

## 2019-11-15 DIAGNOSIS — G546 Phantom limb syndrome with pain: Secondary | ICD-10-CM | POA: Diagnosis not present

## 2019-11-15 MED ORDER — LEVETIRACETAM ER 750 MG PO TB24: 1500.0000 mg | ORAL_TABLET | Freq: Every day | ORAL | 3 refills | Status: DC

## 2019-11-15 MED ORDER — PREGABALIN 50 MG PO CAPS: ORAL_CAPSULE | ORAL | 3 refills | Status: DC

## 2019-11-15 NOTE — Progress Notes (Signed)
Virtual Visit via Video Note The purpose of this virtual visit is to provide medical care while limiting exposure to the novel coronavirus.    Consent was obtained for video visit:  Yes.   Answered questions that patient had about telehealth interaction:  Yes.   I discussed the limitations, risks, security and privacy concerns of performing an evaluation and management service by telemedicine. I also discussed with the patient that there may be a patient responsible charge related to this service. The patient expressed understanding and agreed to proceed.  Pt location: Home Physician Location: office Name of referring provider:  [name], MD I connected with [name] at patients initiation/request on 11/15/2019 at  3:00 PM EDT by video enabled telemedicine application and verified that I am speaking with the correct person using two identifiers. Pt MRN:  [id number] Pt DOB:  1955/12/24 Video Participants:  [name]   History of Present Illness:  The patient had a virtual video visit on 11/15/2019. She was last seen in the neurology clinic 6 months ago for seizures and memory loss. She continues to do well from a seizure standpoint, seizure-free since 2015 on Levetiracetam ER 1500mg  qhs with no side effects. She has phantom limb pain with good response to Lyrica, however she stopped Lyrica after she had bipedal edema earlier this month due to CHF. She has seen her Cardiologist and told she is okay, she feels better. She was reporting memory changes on last visit. Neuropsychological testing done in June 2021 was largely within expected normative ranges. There were relative weaknesses across certain aspects of executive functioning, as well as some performance variability across several domains, but scores within normal limits. Etiology of cognitive changes possibly multifactorial, but overall no indication of a neurodegenerative illness. She feels her memory is about the same.  Long-term memory is good, but she cannot recall things from a few minutes prior. She continues to take her medications independently with no issues, her son fixes her pillbox. She is not driving. She denies any headaches, dizziness, vision changes, no falls. Sleep is good.   HPI: This is a 64 yo RH woman with a history of diabetes, hypertension, hyperlipidemia, peripheral vascular disease s/p right carotid endarterectomy, CAD s/p CABG, and right BKA secondary to diabetes. She was admitted for left foot osteomyelitis due to poor healing wound after a fall last winter. She underwent left transtibial amputation on 08/22/13. She was discharged home on 06/11, then started having altered mental status on 06/15. Her son reports she had hit her head in the left frontal region when getting into their car, then started acting drowsy and out of it the next day. That evening, they were getting ready to eat when her right arm flexed and clenched into her chest followed by head turn to the left,stiffening and shaking for 1 minute as she fell to her left side. She was confused for 10 minutes, back to baseline within 30 minutes. She was brought to [facility] ER where BP was noted to be 205/87. She was given IV labetalol with slow improvement in BP. Blood sugar noted to be 303. Head CT showed low attenuation in the left corona radiata and centrum semiovale. She had an MRI brain initially without contrast which showed increased T2 and FLAIR signal in the left hemisphere from the anterior frontal cortex, supplemental motor area, extending down to the posterior limb of the internal capsule. She became agitated and needed sedation for MRI brain with contrast, however  again became restless with motion degradation, no abnormal enhancement seen. Left ICA flow void was noted to be absent, similar since 2009. It was felt that symptoms were not consistent with left MCA ischemia and concern was for viral CNS infection and received 4 days of  empiric IV Acyclovir. She underwent a lumbar puncture which showed WBC 1, RBC 17, glucose 82, protein 29, VDRL, CMV, HSV, arbovirus, gram stain and culture negative. No malignant cells on cytology. She was started on Keppra with no further seizures, discharged home on 06/18. She was apparently intermittently confused at home, woke up better then next day, then had another seizure that morning. Her son today reports she had a generalized convulsion with head deviation to left for 5 minutes. On arrival to the ER, per records, she was poorly responsive with right gaze deviation, nystagmoid eye movements. BP 197/80 She was given IV Ativan, according to her son she remained in this state for an hour, then was unresponsive for 36 hours after. Prolonged EEG showed diffuse slowing, no electrographic seizures or epileptiform discharges. Repeat MRI brain with and without contrast was again degraded by motion. Similar abnormal signal in the left frontal and parietal lobes again noted, predominantly in the centrum semiovale extending into the subcortical white matter. There is an area of questionable thickening in the left frontal cortex noted to be unchanged, no effacement of sulci in this area, no restricted diffusion, no abnormal enhancement although study is suboptimal. Chronically occluded left ICA again noted. She was discharged home on Keppra XR 1500mg /day. Repeat MRI 2 months later showed complete resolution of abnormal signal in the left frontal and parietal regions. There was mild chronic small vessel disease bilaterally. Per report, findings could have been due to cerebritis or conceivably due to venous thrombosis without infarction.   Of note, in 2009, she had a nocturnal convulsion and was admitted to [facility]. Records unavailable for review, according to her son she was not started on seizure medication due to first seizure. She had an MRI brain at that time which I personally reviewed, it was noted to be limited by  motion and patient refusing to complete study, however on FLAIR sequences, white matter changes in the left hemisphere are not present.   Epilepsy Risk Factors: Left frontal white matter lesion that has resolved. Otherwise she had a normal birth and early development. There is no history of febrile convulsions, significant traumatic brain injury, neurosurgical procedures, or family history of seizures.      Current Outpatient Medications on File Prior to Visit  Medication Sig Dispense Refill  . albuterol (VENTOLIN HFA) 108 (90 Base) MCG/ACT inhaler Inhale 1-2 puffs into the lungs every 6 (six) hours as needed for wheezing or shortness of breath.     Marland Kitchen amLODipine (NORVASC) 5 MG tablet Take 1 tablet (5 mg total) by mouth daily.    Marland Kitchen aspirin 81 MG tablet Take 1 tablet (81 mg total) by mouth daily. 90 tablet 0  . atorvastatin (LIPITOR) 80 MG tablet Take 1 tablet (80 mg total) by mouth daily. 90 tablet 0  . carvedilol (COREG) 25 MG tablet TAKE 1 TABLET BY MOUTH EVERY DAY (Patient taking differently: Take 25 mg by mouth daily. ) 90 tablet 0  . clopidogrel (PLAVIX) 75 MG tablet Take 1 tablet (75 mg total) by mouth daily. 90 tablet 0  . LANTUS SOLOSTAR 100 UNIT/ML Solostar Pen Inject 70 Units into the skin daily. Taking in the evening    . Levetiracetam 750 MG  TB24 Take 2 tablets (1,500 mg total) by mouth at bedtime. 180 tablet 3  . lidocaine (LIDODERM) 5 % Place 1 patch onto the skin daily. evening    . Multiple Vitamins-Minerals (MULTIVITAMINS THER. W/MINERALS) TABS Take 1 tablet by mouth daily.      . polyethylene glycol (MIRALAX / GLYCOLAX) 17 g packet Take 17 g by mouth daily as needed for mild constipation. 14 each 0  . potassium chloride SA (KLOR-CON) 20 MEQ tablet Take 20 mEq by mouth 2 (two) times daily.    . pregabalin (LYRICA) 50 MG capsule Take 1 capsule (50 mg total) by mouth 2 (two) times daily. 180 capsule 3  . sertraline (ZOLOFT) 100 MG tablet Take 1 tablet (100 mg total) by mouth 2  (two) times daily. 180 tablet 0  . busPIRone (BUSPAR) 15 MG tablet Take 30 mg by mouth 2 (two) times daily as needed (anxiety).  (Patient not taking: Reported on 11/15/2019)    . methocarbamol (ROBAXIN) 500 MG tablet Take 1 tablet (500 mg total) by mouth every 6 (six) hours as needed for muscle spasms. (Patient not taking: Reported on 11/15/2019) 30 tablet 0  . olmesartan (BENICAR) 40 MG tablet Take 40 mg by mouth daily. (Patient not taking: Reported on 11/15/2019)     No current facility-administered medications on file prior to visit.     Observations/Objective:   Vitals:   11/15/19 1344  Weight: 188 lb (85.3 kg)  Height: 5\' 5"  (1.651 m)   GEN:  The patient appears stated age and is in NAD.  Neurological examination: Patient is awake, alert, oriented x 3. No aphasia or dysarthria. Intact fluency and comprehension. Remote and recent memory intact.Cranial nerves: Extraocular movements intact with no nystagmus. No facial asymmetry. Motor: s/p bilateral BKA, moves all extremities symmetrically, at least anti-gravity x 4.   Assessment and Plan:   This is a 64 yo RH woman with a history of diabetes, hypertension, hyperlipidemia, peripheral vascular disease s/p right carotid endarterectomy, CAD s/p CABG, s/p bilateral BKA, nocturnal convulsion in 2009. She underwent left transtibial amputation on 08/22/13 then had a seizure on 08/25/13. MRI brain showed a lesion in the left frontal and parietal white matter, concerning for vasogenic edema. LP unremarkable, including cytology. Repeat MRI brain 2 months later showed complete resolution of abnormal signal. Considerations include cerebritis, venous thrombosis without infarction. She was noted to have hypertensive emergency with the first seizure, although asymmetric and more frontal, atypical PRES is another consideration, particularly with complete resolution on MRI. She remains seizure-free since June 2015 on Levetiracetam ER 1500mg  qhs. We discussed the  option of weaning off medication, would repeat EEG, and if normal, we can wean off, understanding risks of breakthrough seizure with any medication adjustment. She opts to stay on LEV, refills sent. Phantom limb pain had good response to Lyrica, however she stopped it due to pedal edema. This appears to have been due to CHF, she was advised to try restarting at lower dose 50mg  qhs and assess if this helps with pain but monitoring for edema. We discussed normal Neuropsych results, patient reassured. Follow-up in 1 year, she knows to call for any changes.    Follow Up Instructions:   -I discussed the assessment and treatment plan with the patient. The patient was provided an opportunity to ask questions and all were answered. The patient agreed with the plan and demonstrated an understanding of the instructions.   The patient was advised to call back or seek an in-person evaluation  if the symptoms worsen or if the condition fails to improve as anticipated.    Cameron Sprang, MD

## 2019-12-30 ENCOUNTER — Telehealth: Payer: Self-pay | Admitting: Neurology

## 2019-12-30 MED ORDER — LEVETIRACETAM ER 500 MG PO TB24: ORAL_TABLET | ORAL | 3 refills | Status: DC

## 2019-12-30 NOTE — Telephone Encounter (Signed)
Pls let patient know her PCP called me about her worsening kidney function. We will need to reduce Keppra dose to 1000mg  daily. I sent in the Rx for Keppra XR 500mg : Take 2 tablet daily. Pls have her stop the 750mg  tabs when she gets the new Rx, thanks

## 2019-12-30 NOTE — Addendum Note (Signed)
Addended by: Ulice Brilliant T on: 12/30/2019 01:36 PM   Modules accepted: Orders

## 2019-12-30 NOTE — Telephone Encounter (Signed)
Pt called and informed that PCP called dr Delice Lesch about her worsening kidney function. We will need to reduce Keppra dose to 1000mg  daily. Dr Delice Lesch sent in the Rx for Keppra XR 500mg : Take 2 tablet daily. Pls  stop the 750mg  tabs when she gets the new Rx

## 2020-04-09 DIAGNOSIS — Z20822 Contact with and (suspected) exposure to covid-19: Secondary | ICD-10-CM | POA: Diagnosis not present

## 2020-04-10 DIAGNOSIS — H16232 Neurotrophic keratoconjunctivitis, left eye: Secondary | ICD-10-CM | POA: Diagnosis not present

## 2020-04-10 DIAGNOSIS — H16223 Keratoconjunctivitis sicca, not specified as Sjogren's, bilateral: Secondary | ICD-10-CM | POA: Diagnosis not present

## 2020-04-10 DIAGNOSIS — H16231 Neurotrophic keratoconjunctivitis, right eye: Secondary | ICD-10-CM | POA: Diagnosis not present

## 2020-05-01 DIAGNOSIS — N3000 Acute cystitis without hematuria: Secondary | ICD-10-CM | POA: Diagnosis not present

## 2020-05-11 DIAGNOSIS — R300 Dysuria: Secondary | ICD-10-CM | POA: Diagnosis not present

## 2020-05-11 DIAGNOSIS — N3000 Acute cystitis without hematuria: Secondary | ICD-10-CM | POA: Diagnosis not present

## 2020-05-31 DIAGNOSIS — H16232 Neurotrophic keratoconjunctivitis, left eye: Secondary | ICD-10-CM | POA: Diagnosis not present

## 2020-05-31 DIAGNOSIS — H16231 Neurotrophic keratoconjunctivitis, right eye: Secondary | ICD-10-CM | POA: Diagnosis not present

## 2020-05-31 DIAGNOSIS — H16223 Keratoconjunctivitis sicca, not specified as Sjogren's, bilateral: Secondary | ICD-10-CM | POA: Diagnosis not present

## 2020-06-29 DIAGNOSIS — H16231 Neurotrophic keratoconjunctivitis, right eye: Secondary | ICD-10-CM | POA: Diagnosis not present

## 2020-06-29 DIAGNOSIS — H16232 Neurotrophic keratoconjunctivitis, left eye: Secondary | ICD-10-CM | POA: Diagnosis not present

## 2020-06-29 DIAGNOSIS — H16223 Keratoconjunctivitis sicca, not specified as Sjogren's, bilateral: Secondary | ICD-10-CM | POA: Diagnosis not present

## 2020-07-10 DIAGNOSIS — L8990 Pressure ulcer of unspecified site, unspecified stage: Secondary | ICD-10-CM | POA: Diagnosis not present

## 2020-07-10 DIAGNOSIS — G546 Phantom limb syndrome with pain: Secondary | ICD-10-CM | POA: Diagnosis not present

## 2020-07-10 DIAGNOSIS — E1142 Type 2 diabetes mellitus with diabetic polyneuropathy: Secondary | ICD-10-CM | POA: Diagnosis not present

## 2020-07-10 DIAGNOSIS — S88112A Complete traumatic amputation at level between knee and ankle, left lower leg, initial encounter: Secondary | ICD-10-CM | POA: Diagnosis not present

## 2020-08-01 DIAGNOSIS — H16232 Neurotrophic keratoconjunctivitis, left eye: Secondary | ICD-10-CM | POA: Diagnosis not present

## 2020-08-01 DIAGNOSIS — H16231 Neurotrophic keratoconjunctivitis, right eye: Secondary | ICD-10-CM | POA: Diagnosis not present

## 2020-08-01 DIAGNOSIS — H16223 Keratoconjunctivitis sicca, not specified as Sjogren's, bilateral: Secondary | ICD-10-CM | POA: Diagnosis not present

## 2020-08-02 DIAGNOSIS — I209 Angina pectoris, unspecified: Secondary | ICD-10-CM | POA: Diagnosis not present

## 2020-08-02 DIAGNOSIS — I10 Essential (primary) hypertension: Secondary | ICD-10-CM | POA: Diagnosis not present

## 2020-08-02 DIAGNOSIS — I739 Peripheral vascular disease, unspecified: Secondary | ICD-10-CM | POA: Diagnosis not present

## 2020-08-02 DIAGNOSIS — I6521 Occlusion and stenosis of right carotid artery: Secondary | ICD-10-CM | POA: Diagnosis not present

## 2020-09-06 DIAGNOSIS — G546 Phantom limb syndrome with pain: Secondary | ICD-10-CM | POA: Diagnosis not present

## 2020-09-06 DIAGNOSIS — E1142 Type 2 diabetes mellitus with diabetic polyneuropathy: Secondary | ICD-10-CM | POA: Diagnosis not present

## 2020-09-06 DIAGNOSIS — L8990 Pressure ulcer of unspecified site, unspecified stage: Secondary | ICD-10-CM | POA: Diagnosis not present

## 2020-09-12 DIAGNOSIS — H16232 Neurotrophic keratoconjunctivitis, left eye: Secondary | ICD-10-CM | POA: Diagnosis not present

## 2020-09-19 ENCOUNTER — Telehealth: Payer: Self-pay | Admitting: Neurology

## 2020-09-19 DIAGNOSIS — G546 Phantom limb syndrome with pain: Secondary | ICD-10-CM

## 2020-09-19 MED ORDER — PREGABALIN 50 MG PO CAPS
ORAL_CAPSULE | ORAL | 1 refills | Status: DC
Start: 2020-09-19 — End: 2021-01-18

## 2020-09-19 NOTE — Telephone Encounter (Signed)
Refills sent until her f/u in 11/2020. Thanks!

## 2020-09-19 NOTE — Telephone Encounter (Signed)
1. Which medications need to be refilled? (please list name of each medication and dose if known) Lyrica  2. Which pharmacy/location (including street and city if local pharmacy) is medication to be sent to? [facility] in [facility]

## 2020-09-19 NOTE — Telephone Encounter (Signed)
Please advise. Last prescribed on 11/15/2019 #90 with 3 refills Last appointment on 11/15/2019. Next appointment on 11/23/2020

## 2020-09-24 DIAGNOSIS — H16232 Neurotrophic keratoconjunctivitis, left eye: Secondary | ICD-10-CM | POA: Diagnosis not present

## 2020-09-24 DIAGNOSIS — N390 Urinary tract infection, site not specified: Secondary | ICD-10-CM | POA: Diagnosis not present

## 2020-09-24 DIAGNOSIS — R300 Dysuria: Secondary | ICD-10-CM | POA: Diagnosis not present

## 2020-09-24 DIAGNOSIS — H16231 Neurotrophic keratoconjunctivitis, right eye: Secondary | ICD-10-CM | POA: Diagnosis not present

## 2020-09-24 DIAGNOSIS — H16223 Keratoconjunctivitis sicca, not specified as Sjogren's, bilateral: Secondary | ICD-10-CM | POA: Diagnosis not present

## 2020-10-07 ENCOUNTER — Other Ambulatory Visit: Payer: Self-pay | Admitting: Neurology

## 2020-10-07 DIAGNOSIS — G40209 Localization-related (focal) (partial) symptomatic epilepsy and epileptic syndromes with complex partial seizures, not intractable, without status epilepticus: Secondary | ICD-10-CM

## 2020-10-25 DIAGNOSIS — H16231 Neurotrophic keratoconjunctivitis, right eye: Secondary | ICD-10-CM | POA: Diagnosis not present

## 2020-10-25 DIAGNOSIS — H16223 Keratoconjunctivitis sicca, not specified as Sjogren's, bilateral: Secondary | ICD-10-CM | POA: Diagnosis not present

## 2020-10-25 DIAGNOSIS — H16232 Neurotrophic keratoconjunctivitis, left eye: Secondary | ICD-10-CM | POA: Diagnosis not present

## 2020-11-15 ENCOUNTER — Other Ambulatory Visit: Payer: Self-pay | Admitting: Neurology

## 2020-11-15 DIAGNOSIS — G40209 Localization-related (focal) (partial) symptomatic epilepsy and epileptic syndromes with complex partial seizures, not intractable, without status epilepticus: Secondary | ICD-10-CM

## 2020-11-16 DIAGNOSIS — R110 Nausea: Secondary | ICD-10-CM | POA: Diagnosis not present

## 2020-11-16 DIAGNOSIS — Z89512 Acquired absence of left leg below knee: Secondary | ICD-10-CM | POA: Diagnosis not present

## 2020-11-16 DIAGNOSIS — Z7982 Long term (current) use of aspirin: Secondary | ICD-10-CM | POA: Diagnosis not present

## 2020-11-16 DIAGNOSIS — R410 Disorientation, unspecified: Secondary | ICD-10-CM | POA: Diagnosis not present

## 2020-11-16 DIAGNOSIS — M549 Dorsalgia, unspecified: Secondary | ICD-10-CM | POA: Diagnosis not present

## 2020-11-16 DIAGNOSIS — Z951 Presence of aortocoronary bypass graft: Secondary | ICD-10-CM | POA: Diagnosis not present

## 2020-11-16 DIAGNOSIS — I110 Hypertensive heart disease with heart failure: Secondary | ICD-10-CM | POA: Diagnosis not present

## 2020-11-16 DIAGNOSIS — R0603 Acute respiratory distress: Secondary | ICD-10-CM | POA: Diagnosis not present

## 2020-11-16 DIAGNOSIS — M7989 Other specified soft tissue disorders: Secondary | ICD-10-CM | POA: Diagnosis not present

## 2020-11-16 DIAGNOSIS — Z882 Allergy status to sulfonamides status: Secondary | ICD-10-CM | POA: Diagnosis not present

## 2020-11-16 DIAGNOSIS — J80 Acute respiratory distress syndrome: Secondary | ICD-10-CM | POA: Diagnosis not present

## 2020-11-16 DIAGNOSIS — R0902 Hypoxemia: Secondary | ICD-10-CM | POA: Diagnosis not present

## 2020-11-16 DIAGNOSIS — R0602 Shortness of breath: Secondary | ICD-10-CM | POA: Diagnosis not present

## 2020-11-16 DIAGNOSIS — Z89511 Acquired absence of right leg below knee: Secondary | ICD-10-CM | POA: Diagnosis not present

## 2020-11-16 DIAGNOSIS — I447 Left bundle-branch block, unspecified: Secondary | ICD-10-CM | POA: Diagnosis not present

## 2020-11-16 DIAGNOSIS — R000 Tachycardia, unspecified: Secondary | ICD-10-CM | POA: Diagnosis not present

## 2020-11-16 DIAGNOSIS — I517 Cardiomegaly: Secondary | ICD-10-CM | POA: Diagnosis not present

## 2020-11-16 DIAGNOSIS — R079 Chest pain, unspecified: Secondary | ICD-10-CM | POA: Diagnosis not present

## 2020-11-16 DIAGNOSIS — Z885 Allergy status to narcotic agent status: Secondary | ICD-10-CM | POA: Diagnosis not present

## 2020-11-16 DIAGNOSIS — I2510 Atherosclerotic heart disease of native coronary artery without angina pectoris: Secondary | ICD-10-CM | POA: Diagnosis not present

## 2020-11-16 DIAGNOSIS — Z79899 Other long term (current) drug therapy: Secondary | ICD-10-CM | POA: Diagnosis not present

## 2020-11-16 DIAGNOSIS — I214 Non-ST elevation (NSTEMI) myocardial infarction: Secondary | ICD-10-CM | POA: Diagnosis not present

## 2020-11-16 DIAGNOSIS — R61 Generalized hyperhidrosis: Secondary | ICD-10-CM | POA: Diagnosis not present

## 2020-11-16 DIAGNOSIS — E785 Hyperlipidemia, unspecified: Secondary | ICD-10-CM | POA: Diagnosis not present

## 2020-11-16 DIAGNOSIS — Z7902 Long term (current) use of antithrombotics/antiplatelets: Secondary | ICD-10-CM | POA: Diagnosis not present

## 2020-11-16 DIAGNOSIS — J90 Pleural effusion, not elsewhere classified: Secondary | ICD-10-CM | POA: Diagnosis not present

## 2020-11-16 DIAGNOSIS — Z20822 Contact with and (suspected) exposure to covid-19: Secondary | ICD-10-CM | POA: Diagnosis not present

## 2020-11-16 DIAGNOSIS — E119 Type 2 diabetes mellitus without complications: Secondary | ICD-10-CM | POA: Diagnosis not present

## 2020-11-16 DIAGNOSIS — I509 Heart failure, unspecified: Secondary | ICD-10-CM | POA: Diagnosis not present

## 2020-11-17 ENCOUNTER — Encounter: Payer: Self-pay | Admitting: Cardiology

## 2020-11-17 ENCOUNTER — Inpatient Hospital Stay
Admission: AD | Admit: 2020-11-17 | Payer: Medicare Other | Source: Other Acute Inpatient Hospital | Admitting: Internal Medicine

## 2020-11-17 DIAGNOSIS — I472 Ventricular tachycardia: Secondary | ICD-10-CM | POA: Diagnosis not present

## 2020-11-17 DIAGNOSIS — I517 Cardiomegaly: Secondary | ICD-10-CM | POA: Diagnosis not present

## 2020-11-17 DIAGNOSIS — I447 Left bundle-branch block, unspecified: Secondary | ICD-10-CM | POA: Diagnosis not present

## 2020-11-17 DIAGNOSIS — R000 Tachycardia, unspecified: Secondary | ICD-10-CM | POA: Diagnosis not present

## 2020-11-17 DIAGNOSIS — N183 Chronic kidney disease, stage 3 unspecified: Secondary | ICD-10-CM | POA: Diagnosis not present

## 2020-11-17 DIAGNOSIS — I5031 Acute diastolic (congestive) heart failure: Secondary | ICD-10-CM | POA: Diagnosis not present

## 2020-11-17 DIAGNOSIS — I5043 Acute on chronic combined systolic (congestive) and diastolic (congestive) heart failure: Secondary | ICD-10-CM | POA: Diagnosis not present

## 2020-11-17 DIAGNOSIS — R6889 Other general symptoms and signs: Secondary | ICD-10-CM | POA: Diagnosis not present

## 2020-11-17 DIAGNOSIS — E46 Unspecified protein-calorie malnutrition: Secondary | ICD-10-CM | POA: Diagnosis not present

## 2020-11-17 DIAGNOSIS — N179 Acute kidney failure, unspecified: Secondary | ICD-10-CM | POA: Diagnosis not present

## 2020-11-17 DIAGNOSIS — R0603 Acute respiratory distress: Secondary | ICD-10-CM | POA: Diagnosis not present

## 2020-11-17 DIAGNOSIS — R079 Chest pain, unspecified: Secondary | ICD-10-CM | POA: Diagnosis not present

## 2020-11-17 DIAGNOSIS — R918 Other nonspecific abnormal finding of lung field: Secondary | ICD-10-CM | POA: Diagnosis not present

## 2020-11-17 DIAGNOSIS — I499 Cardiac arrhythmia, unspecified: Secondary | ICD-10-CM | POA: Diagnosis not present

## 2020-11-17 DIAGNOSIS — I5033 Acute on chronic diastolic (congestive) heart failure: Secondary | ICD-10-CM | POA: Diagnosis not present

## 2020-11-17 DIAGNOSIS — I348 Other nonrheumatic mitral valve disorders: Secondary | ICD-10-CM | POA: Diagnosis not present

## 2020-11-17 DIAGNOSIS — I214 Non-ST elevation (NSTEMI) myocardial infarction: Secondary | ICD-10-CM | POA: Diagnosis not present

## 2020-11-17 DIAGNOSIS — R0989 Other specified symptoms and signs involving the circulatory and respiratory systems: Secondary | ICD-10-CM | POA: Diagnosis not present

## 2020-11-17 DIAGNOSIS — E1151 Type 2 diabetes mellitus with diabetic peripheral angiopathy without gangrene: Secondary | ICD-10-CM | POA: Diagnosis not present

## 2020-11-17 DIAGNOSIS — J9601 Acute respiratory failure with hypoxia: Secondary | ICD-10-CM | POA: Diagnosis not present

## 2020-11-17 DIAGNOSIS — I509 Heart failure, unspecified: Secondary | ICD-10-CM | POA: Diagnosis not present

## 2020-11-17 DIAGNOSIS — I779 Disorder of arteries and arterioles, unspecified: Secondary | ICD-10-CM | POA: Diagnosis not present

## 2020-11-17 DIAGNOSIS — E785 Hyperlipidemia, unspecified: Secondary | ICD-10-CM | POA: Diagnosis not present

## 2020-11-17 DIAGNOSIS — Z89512 Acquired absence of left leg below knee: Secondary | ICD-10-CM | POA: Diagnosis not present

## 2020-11-17 DIAGNOSIS — I130 Hypertensive heart and chronic kidney disease with heart failure and stage 1 through stage 4 chronic kidney disease, or unspecified chronic kidney disease: Secondary | ICD-10-CM | POA: Diagnosis not present

## 2020-11-17 DIAGNOSIS — N189 Chronic kidney disease, unspecified: Secondary | ICD-10-CM | POA: Diagnosis not present

## 2020-11-17 DIAGNOSIS — I5021 Acute systolic (congestive) heart failure: Secondary | ICD-10-CM | POA: Diagnosis not present

## 2020-11-17 DIAGNOSIS — Z951 Presence of aortocoronary bypass graft: Secondary | ICD-10-CM | POA: Diagnosis not present

## 2020-11-17 DIAGNOSIS — J810 Acute pulmonary edema: Secondary | ICD-10-CM | POA: Diagnosis not present

## 2020-11-17 DIAGNOSIS — I110 Hypertensive heart disease with heart failure: Secondary | ICD-10-CM | POA: Diagnosis not present

## 2020-11-17 DIAGNOSIS — Z743 Need for continuous supervision: Secondary | ICD-10-CM | POA: Diagnosis not present

## 2020-11-17 DIAGNOSIS — Z7982 Long term (current) use of aspirin: Secondary | ICD-10-CM | POA: Diagnosis not present

## 2020-11-17 DIAGNOSIS — Z882 Allergy status to sulfonamides status: Secondary | ICD-10-CM | POA: Diagnosis not present

## 2020-11-17 DIAGNOSIS — Z794 Long term (current) use of insulin: Secondary | ICD-10-CM | POA: Diagnosis not present

## 2020-11-17 DIAGNOSIS — Z885 Allergy status to narcotic agent status: Secondary | ICD-10-CM | POA: Diagnosis not present

## 2020-11-17 DIAGNOSIS — E1122 Type 2 diabetes mellitus with diabetic chronic kidney disease: Secondary | ICD-10-CM | POA: Diagnosis not present

## 2020-11-17 DIAGNOSIS — R0902 Hypoxemia: Secondary | ICD-10-CM | POA: Diagnosis not present

## 2020-11-17 DIAGNOSIS — I2510 Atherosclerotic heart disease of native coronary artery without angina pectoris: Secondary | ICD-10-CM | POA: Diagnosis not present

## 2020-11-17 DIAGNOSIS — E782 Mixed hyperlipidemia: Secondary | ICD-10-CM | POA: Diagnosis not present

## 2020-11-17 DIAGNOSIS — Z7902 Long term (current) use of antithrombotics/antiplatelets: Secondary | ICD-10-CM | POA: Diagnosis not present

## 2020-11-17 DIAGNOSIS — G8929 Other chronic pain: Secondary | ICD-10-CM | POA: Diagnosis not present

## 2020-11-17 DIAGNOSIS — Z79899 Other long term (current) drug therapy: Secondary | ICD-10-CM | POA: Diagnosis not present

## 2020-11-17 DIAGNOSIS — I5032 Chronic diastolic (congestive) heart failure: Secondary | ICD-10-CM | POA: Diagnosis not present

## 2020-11-17 DIAGNOSIS — N390 Urinary tract infection, site not specified: Secondary | ICD-10-CM | POA: Diagnosis not present

## 2020-11-17 DIAGNOSIS — I350 Nonrheumatic aortic (valve) stenosis: Secondary | ICD-10-CM | POA: Diagnosis not present

## 2020-11-17 DIAGNOSIS — R9431 Abnormal electrocardiogram [ECG] [EKG]: Secondary | ICD-10-CM | POA: Diagnosis not present

## 2020-11-17 DIAGNOSIS — Z9911 Dependence on respirator [ventilator] status: Secondary | ICD-10-CM | POA: Diagnosis not present

## 2020-11-17 DIAGNOSIS — Z20822 Contact with and (suspected) exposure to covid-19: Secondary | ICD-10-CM | POA: Diagnosis not present

## 2020-11-17 DIAGNOSIS — Z89511 Acquired absence of right leg below knee: Secondary | ICD-10-CM | POA: Diagnosis not present

## 2020-11-17 DIAGNOSIS — R0602 Shortness of breath: Secondary | ICD-10-CM | POA: Diagnosis not present

## 2020-11-17 DIAGNOSIS — I080 Rheumatic disorders of both mitral and aortic valves: Secondary | ICD-10-CM | POA: Diagnosis not present

## 2020-11-17 DIAGNOSIS — E1165 Type 2 diabetes mellitus with hyperglycemia: Secondary | ICD-10-CM | POA: Diagnosis not present

## 2020-11-17 DIAGNOSIS — E119 Type 2 diabetes mellitus without complications: Secondary | ICD-10-CM | POA: Diagnosis not present

## 2020-11-17 DIAGNOSIS — R410 Disorientation, unspecified: Secondary | ICD-10-CM | POA: Diagnosis not present

## 2020-11-17 DIAGNOSIS — J90 Pleural effusion, not elsewhere classified: Secondary | ICD-10-CM | POA: Diagnosis not present

## 2020-11-17 DIAGNOSIS — R638 Other symptoms and signs concerning food and fluid intake: Secondary | ICD-10-CM | POA: Diagnosis not present

## 2020-11-17 NOTE — Progress Notes (Signed)
65 y/o female with multiple comorbidities including hypertension, type 2 DM, CKD cr 1.5, CAD s/p prior CABG, PAD s/p b/l BKA, presented with acute hypoxia respiratory failure, hypertensive snd tachycardic on presentation. EKG with new LBBB negative S[place] criteria. CTA negative for PE. Trop up to 65 (old assay)  NSTEMI, also suspect heart failure. Transfer to [facility] for NSTEMI, will need cath after medical stabilization.  Aw aiting bed availability.

## 2020-11-19 ENCOUNTER — Encounter: Payer: Self-pay | Admitting: Cardiology

## 2020-11-19 NOTE — Progress Notes (Signed)
Patient was opposed to be transferred to [facility] for further evaluation.  However it appears that the patient was not transferred.  Will have the office reach out to her for outpatient evaluation if clinically appropriate.  Rex Kras, [place], [facility]  Pager: [phone] Office: [phone]

## 2020-11-23 ENCOUNTER — Ambulatory Visit: Payer: Medicare Other | Admitting: Neurology

## 2020-11-26 DIAGNOSIS — Z794 Long term (current) use of insulin: Secondary | ICD-10-CM | POA: Diagnosis not present

## 2020-11-26 DIAGNOSIS — E1142 Type 2 diabetes mellitus with diabetic polyneuropathy: Secondary | ICD-10-CM | POA: Diagnosis not present

## 2020-11-26 DIAGNOSIS — R899 Unspecified abnormal finding in specimens from other organs, systems and tissues: Secondary | ICD-10-CM | POA: Diagnosis not present

## 2020-11-26 DIAGNOSIS — J9601 Acute respiratory failure with hypoxia: Secondary | ICD-10-CM | POA: Diagnosis not present

## 2020-11-26 DIAGNOSIS — R944 Abnormal results of kidney function studies: Secondary | ICD-10-CM | POA: Diagnosis not present

## 2020-11-26 DIAGNOSIS — N184 Chronic kidney disease, stage 4 (severe): Secondary | ICD-10-CM | POA: Diagnosis not present

## 2020-11-26 DIAGNOSIS — I2510 Atherosclerotic heart disease of native coronary artery without angina pectoris: Secondary | ICD-10-CM | POA: Diagnosis not present

## 2020-11-26 DIAGNOSIS — I130 Hypertensive heart and chronic kidney disease with heart failure and stage 1 through stage 4 chronic kidney disease, or unspecified chronic kidney disease: Secondary | ICD-10-CM | POA: Diagnosis not present

## 2020-11-26 DIAGNOSIS — Z09 Encounter for follow-up examination after completed treatment for conditions other than malignant neoplasm: Secondary | ICD-10-CM | POA: Diagnosis not present

## 2020-11-26 DIAGNOSIS — G546 Phantom limb syndrome with pain: Secondary | ICD-10-CM | POA: Diagnosis not present

## 2020-11-26 DIAGNOSIS — B379 Candidiasis, unspecified: Secondary | ICD-10-CM | POA: Diagnosis not present

## 2020-11-26 DIAGNOSIS — I509 Heart failure, unspecified: Secondary | ICD-10-CM | POA: Diagnosis not present

## 2020-11-28 ENCOUNTER — Ambulatory Visit: Payer: Medicare Other | Admitting: Neurology

## 2020-11-29 ENCOUNTER — Ambulatory Visit: Payer: Medicare Other | Admitting: Internal Medicine

## 2020-11-29 DIAGNOSIS — I5022 Chronic systolic (congestive) heart failure: Secondary | ICD-10-CM | POA: Diagnosis not present

## 2020-12-03 DIAGNOSIS — E119 Type 2 diabetes mellitus without complications: Secondary | ICD-10-CM | POA: Diagnosis not present

## 2020-12-03 DIAGNOSIS — H16232 Neurotrophic keratoconjunctivitis, left eye: Secondary | ICD-10-CM | POA: Diagnosis not present

## 2020-12-03 DIAGNOSIS — H16231 Neurotrophic keratoconjunctivitis, right eye: Secondary | ICD-10-CM | POA: Diagnosis not present

## 2020-12-26 ENCOUNTER — Other Ambulatory Visit: Payer: Self-pay | Admitting: Neurology

## 2020-12-26 DIAGNOSIS — G40209 Localization-related (focal) (partial) symptomatic epilepsy and epileptic syndromes with complex partial seizures, not intractable, without status epilepticus: Secondary | ICD-10-CM

## 2021-01-14 DIAGNOSIS — I5041 Acute combined systolic (congestive) and diastolic (congestive) heart failure: Secondary | ICD-10-CM | POA: Diagnosis not present

## 2021-01-17 ENCOUNTER — Encounter: Payer: Self-pay | Admitting: Cardiology

## 2021-01-17 ENCOUNTER — Encounter: Payer: Self-pay | Admitting: Family Medicine

## 2021-01-17 DIAGNOSIS — I5023 Acute on chronic systolic (congestive) heart failure: Secondary | ICD-10-CM | POA: Insufficient documentation

## 2021-01-17 DIAGNOSIS — N1832 Chronic kidney disease, stage 3b: Secondary | ICD-10-CM | POA: Insufficient documentation

## 2021-01-17 DIAGNOSIS — Q233 Congenital mitral insufficiency: Secondary | ICD-10-CM | POA: Insufficient documentation

## 2021-01-17 DIAGNOSIS — I350 Nonrheumatic aortic (valve) stenosis: Secondary | ICD-10-CM | POA: Insufficient documentation

## 2021-01-17 NOTE — Progress Notes (Signed)
Cardiology Office Note   Date:  01/18/2021   ID:  TOMMYE Foster, DOB 1955/08/24, MRN [id number]  PCP:  [name], MD  Cardiologist:   Minus Breeding, MD Referring:  [name], MD  Chief Complaint  Patient presents with   Coronary Artery Disease      History of Present Illness: [name] is a 65 y.o. female who presents for evaluation of CAD and systolic HF.  She has had a history of CABG, bilateral lower extremity amputations chronic kidney disease.  It looks like she was admitted last month at [facility] and I was able to review these records.  She had acute on chronic heart failure.  Her discharge weight was 177 pounds.  She does have chronic renal insufficiency with her last creatinine being 1.74.  An echocardiogram in September demonstrated an EF of 30 to 35%.  There was thinning and akinesis of the mid anterior, mid anteroseptal, basal anterior and basal anteroseptal segments.  There was moderately thickened aortic valve with severe aortic stenosis.  There was mild mitral regurgitation.  She reports to me that she went to the hospital because she thought she had a urinary infection and then as she was getting ready to go she became acutely short of breath.  She was treated with IV diuresis.  She did not have any chest pressure, neck or arm discomfort.  Toward the end of her hospitalization they suggested a cardiac catheterization but she did not want to have that done at [facility] and wanted to switch her care here.  She says that since going home she has not had any new shortness of breath, PND or orthopnea.  She chronically sleeps on 4 pillows.  Her weights have been steady at 178 pounds.  She not had any palpitations, presyncope or syncope.  She does not recall being told that she had stiff heart valve and I did review her previous echocardiogram from 2021 and her EF was 55%.  There was moderate aortic valve stenosis at that time.   Past Medical History:   Diagnosis Date   Acute congestive heart failure 03/29/2018   Acute encephalopathy 09/02/2013   Anemia, normocytic normochromic 03/14/2008   Arthritis    "hands"   Atherosclerosis of native arteries of the extremities with ulceration 07/14/2013   Atherosclerosis of native artery of extremity with intermittent claudication 09/16/2011   CAD (coronary artery disease) of artery bypass graft 03/14/2008   Chronic kidney disease (CKD) 12/26/2015   stage 3 due to type 2 diabetes mellitus   Chronic pain syndrome 50/03/6427   Complication of anesthesia    pt felt like she had a hard time waking up after surgery in Feb. 2015.   Coronary artery disease involving native coronary artery without angina pectoris 09/25/2015   Essential hypertension 03/14/2008   Gastroesophageal reflux disease with esophagitis 07/09/2016   Generalized anxiety disorder with panic attacks    History of one panic attack   GERD (gastroesophageal reflux disease)    takes Zantac   Hyperlipidemia, unspecified 03/14/2008   Localization-related symptomatic epilepsy and epileptic syndromes with complex partial seizures, not intractable, without status epilepticus 06/27/2014   Major depressive disorder 03/14/2008   Neuropathy    Osteomyelitis of ankle or foot, left, acute 06/17/2013   Peripheral vascular disease    PVD (peripheral vascular disease) 03/25/2012   Type 2 diabetes mellitus with diabetic mononeuropathy, with long-term current use of insulin 03/14/2008    Past Surgical History:  Procedure  Laterality Date   AMPUTATION Left 08/22/2013   Procedure: LEFT BELOW KNEE AMPUTATION;  Surgeon: Newt Minion, MD;  Location: [place];  Service: Orthopedics;  Laterality: Left;   ANKLE FUSION Left 05/11/2013   TIBIOCALCANEAL FUSION    ANKLE FUSION Left 05/12/2013   Procedure: LEFT TIBIOCALCANEAL FUSION;  Surgeon: Newt Minion, MD;  Location: [place];  Service: Orthopedics;  Laterality: Left;  Left Tibiocalcaneal Fusion   APPLICATION  OF WOUND VAC  06/17/2013   Procedure: APPLICATION OF WOUND VAC;  Surgeon: Newt Minion, MD;  Location: [place];  Service: Orthopedics;;   BELOW KNEE LEG AMPUTATION Right Jan. 2008   CAROTID ENDARTERECTOMY Right    CATARACT EXTRACTION Left 12/25/14   CHOLECYSTECTOMY     COLONOSCOPY     CORONARY ARTERY BYPASS GRAFT  12/01/2007   "CABG X4" (05/12/2013)   DILATION AND CURETTAGE OF UTERUS     I & D EXTREMITY Left 06/17/2013   Procedure: IRRIGATION AND DEBRIDEMENT EXTREMITY, PLACEMENT ANTIBIOTIC STIMULAN BEADS;  Surgeon: Newt Minion, MD;  Location: [facility];  Service: Orthopedics;  Laterality: Left;   INTRAMEDULLARY (IM) NAIL INTERTROCHANTERIC Left 08/20/2018   Procedure: INTRAMEDULLARY (IM) NAIL INTERTROCHANTRIC;  Surgeon: Shona Needles, MD;  Location: [place];  Service: Orthopedics;  Laterality: Left;   IRRIGATION AND DEBRIDEMENT ABSCESS Left 06/17/2013   ANKLE           DR [name]   TOE AMPUTATION Right    "took off a couple toes before the amputation"   TONSILLECTOMY       Current Outpatient Medications  Medication Sig Dispense Refill   acetaminophen-codeine (TYLENOL #3) 300-30 MG tablet 1 tablet as needed     AgaMatrix Ultra-Thin Lancets MISC See admin instructions.     albuterol (VENTOLIN HFA) 108 (90 Base) MCG/ACT inhaler Inhale 1-2 puffs into the lungs every 6 (six) hours as needed for wheezing or shortness of breath.      ALPRAZolam (XANAX) 0.5 MG tablet 1 tablet     aspirin 81 MG tablet Take 1 tablet (81 mg total) by mouth daily. 90 tablet 0   Blood Glucose Monitoring Suppl (FIFTY50 GLUCOSE METER 2.0) w/Device KIT See admin instructions.     carvedilol (COREG) 6.25 MG tablet 1 tablet     clopidogrel (PLAVIX) 75 MG tablet Take 1 tablet (75 mg total) by mouth daily. 90 tablet 0   Continuous Blood Gluc Sensor (FREESTYLE LIBRE 14 DAY SENSOR) MISC See admin instructions.     empagliflozin (JARDIANCE) 10 MG TABS tablet Take by mouth daily.     fluconazole (DIFLUCAN) 150 MG tablet Take 150 mg by  mouth daily.     fluconazole (DIFLUCAN) 150 MG tablet 1 tablet     insulin lispro (HUMALOG) 100 UNIT/ML injection Inject into the skin.     LANTUS SOLOSTAR 100 UNIT/ML Solostar Pen Inject 70 Units into the skin daily. Taking in the evening     Levetiracetam 750 MG TB24 Take 1,500 mg by mouth at bedtime.     lidocaine (LIDODERM) 5 % Place 1 patch onto the skin daily. evening     Multiple Vitamins-Minerals (MULTIVITAMINS THER. W/MINERALS) TABS Take 1 tablet by mouth daily.       mupirocin ointment (BACTROBAN) 2 % 1 application 2 (two) times daily.     nitroGLYCERIN (NITROSTAT) 0.4 MG SL tablet Place 0.4 mg under the tongue every 5 (five) minutes as needed for chest pain.     ONETOUCH ULTRA test strip CHECK BLOOD SUGAR  ONCE A DAY     potassium chloride SA (KLOR-CON) 20 MEQ tablet Take 20 mEq by mouth 2 (two) times daily.     ranolazine (RANEXA) 500 MG 12 hr tablet Take 1,000 mg by mouth 2 (two) times daily.     rosuvastatin (CRESTOR) 40 MG tablet Take 40 mg by mouth daily.     sertraline (ZOLOFT) 100 MG tablet Take 1 tablet (100 mg total) by mouth 2 (two) times daily. 180 tablet 0   torsemide (DEMADEX) 20 MG tablet Take 20 mg by mouth in the morning, at noon, and at bedtime.     No current facility-administered medications for this visit.    Allergies:   Metformin hcl, Ace inhibitors, Actos [pioglitazone], Atorvastatin, Candesartan cilexetil, Gabapentin, Isosorbide nitrate, Quinapril hcl, Tramadol hcl, and Tape    Social History:  The patient  reports that she has never smoked. She has never used smokeless tobacco. She reports that she does not drink alcohol and does not use drugs.   Family History:  The patient's family history includes Alzheimer's disease in her maternal grandfather; Cancer in her mother; Diabetes in her father; Heart disease in her father; Hyperlipidemia in her father; Hypertension in her father.    ROS:  Please see the history of present illness.   Otherwise, review of  systems are positive for constipation.   All other systems are reviewed and negative.    PHYSICAL EXAM: VS:  BP (!) 105/56   Pulse 80   Ht $R'5\' 5"'XV$  (1.651 m)   Wt 188 lb 9.6 oz (85.5 kg)   SpO2 96%   BMI 31.38 kg/m  , BMI Body mass index is 31.38 kg/m. GENERAL:  Well appearing HEENT:  Pupils equal round and reactive, fundi not visualized, oral mucosa unremarkable NECK:  No jugular venous distention, waveform within normal limits, carotid upstroke brisk and symmetric, positive bruits, no thyromegaly LYMPHATICS:  No cervical, inguinal adenopathy LUNGS:  Clear to auscultation bilaterally BACK:  No CVA tenderness CHEST:  Unremarkable HEART:  PMI not displaced or sustained,S1 and S2 within normal limits, no S3, no S4, no clicks, no rubs, 3-6 apical systolic murmur radiating slightly at the aortic outflow tract, no diastolic murmurs ABD:  Flat, positive bowel sounds normal in frequency in pitch, no bruits, no rebound, no guarding, no midline pulsatile mass, no hepatomegaly, no splenomegaly EXT:  2 plus pulses throughout, no cyanosis no clubbing, status post bilateral BKA's SKIN:  No rashes no nodules NEURO:  Cranial nerves II through XII grossly intact, motor grossly intact throughout PSYCH:  Cognitively intact, oriented to person place and time    EKG:  EKG is ordered today. The ekg ordered today demonstrates sinus rhythm, rate 88, left bundle branch block, no acute ST-T wave changes.     Recent Labs: No results found for requested labs within last 8760 hours.    Lipid Panel    Component Value Date/Time   CHOL 113 08/30/2013 0550   TRIG 149 08/30/2013 0550   HDL 36 (L) 08/30/2013 0550   CHOLHDL 3.1 08/30/2013 0550   VLDL 30 08/30/2013 0550   LDLCALC 47 08/30/2013 0550      Wt Readings from Last 3 Encounters:  01/18/21 188 lb 9.6 oz (85.5 kg)  11/15/19 188 lb (85.3 kg)  04/18/19 189 lb (85.7 kg)      Other studies Reviewed: Additional studies/ records that were reviewed  today include: Extensive review of [facility] records.      (Greater than 60 minutes reviewing  all data with greater than 50% face to face with the patient). Review of the above records demonstrates:  Please see elsewhere in the note.     ASSESSMENT AND PLAN:  ACUTE ON CHRONIC SYSTOLIC HF: This is a newly reduced ejection fraction.  Ultimately I think she needs right and left heart catheterization.  I will be looking at a repeat echocardiogram.  I will also be looking into renal function and considering the timing of catheterization whether she needs to be hydrated first.  For now she is on a good medical regimen.  No change in therapy.  AS:  This was moderate.  This will be assessed with a right heart catheterization  HTN:    This is being managed in the context of treating his CHF  CAD/CABG: This will be assessed as above.  MITRAL REGURGITATION: I will follow this at the time of her echo.   Current medicines adoes not havere reviewed at length with the patient today.  The patient  concerns regarding medicines.  The following changes have been made:  no change  Labs/ tests ordered today include:   Orders Placed This Encounter  Procedures   Basic metabolic panel   EKG [id number]   ECHOCARDIOGRAM COMPLETE      Disposition:   FU with me after the echo.   Signed, Minus Breeding, MD  01/18/2021 3:35 PM    [facility]

## 2021-01-18 ENCOUNTER — Other Ambulatory Visit: Payer: Self-pay

## 2021-01-18 ENCOUNTER — Ambulatory Visit (INDEPENDENT_AMBULATORY_CARE_PROVIDER_SITE_OTHER): Payer: Medicare Other | Admitting: Cardiology

## 2021-01-18 ENCOUNTER — Encounter: Payer: Self-pay | Admitting: Cardiology

## 2021-01-18 VITALS — BP 105/56 | HR 80 | Ht 65.0 in | Wt 188.6 lb

## 2021-01-18 DIAGNOSIS — Q233 Congenital mitral insufficiency: Secondary | ICD-10-CM

## 2021-01-18 DIAGNOSIS — I429 Cardiomyopathy, unspecified: Secondary | ICD-10-CM

## 2021-01-18 DIAGNOSIS — I350 Nonrheumatic aortic (valve) stenosis: Secondary | ICD-10-CM

## 2021-01-18 DIAGNOSIS — I25810 Atherosclerosis of coronary artery bypass graft(s) without angina pectoris: Secondary | ICD-10-CM

## 2021-01-18 DIAGNOSIS — I5023 Acute on chronic systolic (congestive) heart failure: Secondary | ICD-10-CM

## 2021-01-18 DIAGNOSIS — N1832 Chronic kidney disease, stage 3b: Secondary | ICD-10-CM

## 2021-01-18 LAB — BASIC METABOLIC PANEL
BUN/Creatinine Ratio: 19 (ref 12–28)
BUN: 53 mg/dL — ABNORMAL HIGH (ref 8–27)
CO2: 26 mmol/L (ref 20–29)
Calcium: 8.9 mg/dL (ref 8.7–10.3)
Chloride: 96 mmol/L (ref 96–106)
Creatinine, Ser: 2.83 mg/dL — ABNORMAL HIGH (ref 0.57–1.00)
Glucose: 270 mg/dL — ABNORMAL HIGH (ref 70–99)
Potassium: 4.3 mmol/L (ref 3.5–5.2)
Sodium: 140 mmol/L (ref 134–144)
eGFR: 18 mL/min/{1.73_m2} — ABNORMAL LOW (ref >59)

## 2021-01-18 NOTE — Patient Instructions (Signed)
Medication Instructions:  Your physician recommends that you continue on your current medications as directed. Please refer to the Current Medication list given to you today.  *If you need a refill on your cardiac medications before your next appointment, please call your pharmacy*  Lab Work: Your physician recommends that you return for lab work TODAY:  BMET  If you have labs (blood work) drawn today and your tests are completely normal, you will receive your results only by: [place] (if you have MyChart) OR A paper copy in the mail If you have any lab test that is abnormal or we need to change your treatment, we will call you to review the results.  Testing/Procedures: Your physician has requested that you have an echocardiogram. Echocardiography is a painless test that uses sound waves to create images of your heart. It provides your doctor with information about the size and shape of your heart and how well your heart's chambers and valves are working. This procedure takes approximately one hour. There are no restrictions for this procedure.  Please schedule for 1-2 weeks  Follow-Up: At [facility], you and your health needs are our priority.  As part of our continuing mission to provide you with exceptional heart care, we have created designated Provider Care Teams.  These Care Teams include your primary Cardiologist (physician) and Advanced Practice Providers (APPs -  Physician Assistants and Nurse Practitioners) who all work together to provide you with the care you need, when you need it.  Your next appointment:   Follow up after Echocardiogram    The format for your next appointment:   In Person  Provider:   Minus Breeding, MD    Other Instructions

## 2021-01-21 ENCOUNTER — Telehealth: Payer: Self-pay | Admitting: *Deleted

## 2021-01-21 DIAGNOSIS — I5023 Acute on chronic systolic (congestive) heart failure: Secondary | ICD-10-CM

## 2021-01-21 NOTE — Telephone Encounter (Signed)
-----   Message from Minus Breeding, MD sent at 01/20/2021  7:22 PM EST ----- Her creat is worse than previous.  She will not be able to have a cath at this time.  I would reduce the Demadex to 20 mg once daily.  (I think that she is taking it 3 x per day.)  I will wait for the echo.  Call Ms. Abril with the results and send results to [name], MD  Please schedule another BMET in about 10 days.

## 2021-01-21 NOTE — Telephone Encounter (Signed)
Spoke with pt, aware of the results.  Lab orders mailed to the pt  Patient voiced understanding of medication changes

## 2021-01-24 DIAGNOSIS — E1142 Type 2 diabetes mellitus with diabetic polyneuropathy: Secondary | ICD-10-CM | POA: Diagnosis not present

## 2021-01-29 ENCOUNTER — Telehealth: Payer: Self-pay | Admitting: Cardiology

## 2021-01-29 NOTE — Progress Notes (Signed)
Cardiology Office Note   Date:  01/31/2021   ID:  [name], DOB Apr 03, 1955, MRN [id number]  PCP:  [name], MD  Cardiologist:   Minus Breeding, MD Referring:  [name], MD  Chief Complaint  Patient presents with   Shortness of Breath       History of Present Illness: [name] is a 65 y.o. female who presents for evaluation of CAD and systolic HF.  She has had a history of CABG, bilateral lower extremity amputations chronic kidney disease.  It looks like she was admitted last month at [facility] and I was able to review these records.  She had acute on chronic heart failure.  Her discharge weight was 177 pounds.  She does have chronic renal insufficiency with her last creatinine being 1.74.  An echocardiogram in September at [facility] demonstrated an EF of 30 to 35%.  There was thinning and akinesis of the mid anterior, mid anteroseptal, basal anterior and basal anteroseptal segments.  There was moderately thickened aortic valve with severe aortic stenosis.  There was mild mitral regurgitation.  I was going to consider cardiac cath but her creat was increased when I saw her in the office for the first visit.  The creat was 2.83.  I reduced her Demadex.  Echo was ordered.  Preliminary reading still demonstrates that the ejection fraction is reduced with severe aortic stenosis.  She says she feels much better than she did when she went to the hospital originally.  Her breathing is she thinks back to baseline.  She has some sharp shooting pains down the left flank at times.  She does not have any resting shortness of breath, PND or orthopnea.  She is not having any new palpitations, presyncope or syncope.  Past Medical History:  Diagnosis Date   Acute congestive heart failure 03/29/2018   Acute encephalopathy 09/02/2013   Anemia, normocytic normochromic 03/14/2008   Arthritis    "hands"   Atherosclerosis of native arteries of the extremities with ulceration  07/14/2013   Atherosclerosis of native artery of extremity with intermittent claudication 09/16/2011   CAD (coronary artery disease) of artery bypass graft 03/14/2008   Chronic kidney disease (CKD) 12/26/2015   stage 3 due to type 2 diabetes mellitus   Chronic pain syndrome 13/14/3888   Complication of anesthesia    pt felt like she had a hard time waking up after surgery in Feb. 2015.   Coronary artery disease involving native coronary artery without angina pectoris 09/25/2015   Essential hypertension 03/14/2008   Gastroesophageal reflux disease with esophagitis 07/09/2016   Generalized anxiety disorder with panic attacks    History of one panic attack   GERD (gastroesophageal reflux disease)    takes Zantac   Hyperlipidemia, unspecified 03/14/2008   Localization-related symptomatic epilepsy and epileptic syndromes with complex partial seizures, not intractable, without status epilepticus 06/27/2014   Major depressive disorder 03/14/2008   Neuropathy    Osteomyelitis of ankle or foot, left, acute 06/17/2013   Peripheral vascular disease    PVD (peripheral vascular disease) 03/25/2012   Type 2 diabetes mellitus with diabetic mononeuropathy, with long-term current use of insulin 03/14/2008    Past Surgical History:  Procedure Laterality Date   AMPUTATION Left 08/22/2013   Procedure: LEFT BELOW KNEE AMPUTATION;  Surgeon: Newt Minion, MD;  Location: [place];  Service: Orthopedics;  Laterality: Left;   ANKLE FUSION Left 05/11/2013   TIBIOCALCANEAL FUSION    ANKLE FUSION Left  05/12/2013   Procedure: LEFT TIBIOCALCANEAL FUSION;  Surgeon: Newt Minion, MD;  Location: [place];  Service: Orthopedics;  Laterality: Left;  Left Tibiocalcaneal Fusion   APPLICATION OF WOUND VAC  06/17/2013   Procedure: APPLICATION OF WOUND VAC;  Surgeon: Newt Minion, MD;  Location: [place];  Service: Orthopedics;;   BELOW KNEE LEG AMPUTATION Right Jan. 2008   CAROTID ENDARTERECTOMY Right    CATARACT EXTRACTION Left  12/25/14   CHOLECYSTECTOMY     COLONOSCOPY     CORONARY ARTERY BYPASS GRAFT  12/01/2007   "CABG X4" (05/12/2013)   DILATION AND CURETTAGE OF UTERUS     I & D EXTREMITY Left 06/17/2013   Procedure: IRRIGATION AND DEBRIDEMENT EXTREMITY, PLACEMENT ANTIBIOTIC STIMULAN BEADS;  Surgeon: Newt Minion, MD;  Location: [place];  Service: Orthopedics;  Laterality: Left;   INTRAMEDULLARY (IM) NAIL INTERTROCHANTERIC Left 08/20/2018   Procedure: INTRAMEDULLARY (IM) NAIL INTERTROCHANTRIC;  Surgeon: Shona Needles, MD;  Location: [place];  Service: Orthopedics;  Laterality: Left;   IRRIGATION AND DEBRIDEMENT ABSCESS Left 06/17/2013   ANKLE           DR [name]   TOE AMPUTATION Right    "took off a couple toes before the amputation"   TONSILLECTOMY       Current Outpatient Medications  Medication Sig Dispense Refill   acetaminophen-codeine (TYLENOL #3) 300-30 MG tablet 1 tablet as needed     AgaMatrix Ultra-Thin Lancets MISC See admin instructions.     albuterol (VENTOLIN HFA) 108 (90 Base) MCG/ACT inhaler Inhale 1-2 puffs into the lungs every 6 (six) hours as needed for wheezing or shortness of breath.      ALPRAZolam (XANAX) 0.5 MG tablet 1 tablet     aspirin 81 MG tablet Take 1 tablet (81 mg total) by mouth daily. 90 tablet 0   Blood Glucose Monitoring Suppl (FIFTY50 GLUCOSE METER 2.0) w/Device KIT See admin instructions.     carvedilol (COREG) 6.25 MG tablet 1 tablet     clopidogrel (PLAVIX) 75 MG tablet Take 1 tablet (75 mg total) by mouth daily. 90 tablet 0   Continuous Blood Gluc Sensor (FREESTYLE LIBRE 14 DAY SENSOR) MISC See admin instructions.     empagliflozin (JARDIANCE) 10 MG TABS tablet Take by mouth daily.     fluconazole (DIFLUCAN) 150 MG tablet Take 150 mg by mouth daily.     fluconazole (DIFLUCAN) 150 MG tablet 1 tablet     insulin lispro (HUMALOG) 100 UNIT/ML injection Inject into the skin.     LANTUS SOLOSTAR 100 UNIT/ML Solostar Pen Inject 70 Units into the skin daily. Taking in the evening      Levetiracetam 750 MG TB24 Take 1,500 mg by mouth at bedtime.     lidocaine (LIDODERM) 5 % Place 1 patch onto the skin daily. evening     Multiple Vitamins-Minerals (MULTIVITAMINS THER. W/MINERALS) TABS Take 1 tablet by mouth daily.       mupirocin ointment (BACTROBAN) 2 % 1 application 2 (two) times daily.     nitroGLYCERIN (NITROSTAT) 0.4 MG SL tablet Place 0.4 mg under the tongue every 5 (five) minutes as needed for chest pain.     ONETOUCH ULTRA test strip CHECK BLOOD SUGAR ONCE A DAY     potassium chloride SA (KLOR-CON) 20 MEQ tablet Take 20 mEq by mouth 2 (two) times daily.     ranolazine (RANEXA) 500 MG 12 hr tablet Take 1,000 mg by mouth 2 (two) times daily.  rosuvastatin (CRESTOR) 40 MG tablet Take 40 mg by mouth daily.     sertraline (ZOLOFT) 100 MG tablet Take 1 tablet (100 mg total) by mouth 2 (two) times daily. 180 tablet 0   torsemide (DEMADEX) 20 MG tablet Take 20 mg by mouth daily.     No current facility-administered medications for this visit.    Allergies:   Metformin hcl, Ace inhibitors, Actos [pioglitazone], Atorvastatin, Candesartan cilexetil, Gabapentin, Isosorbide nitrate, Quinapril hcl, Tramadol hcl, and Tape   ROS:  Please see the history of present illness.   Otherwise, review of systems are positive for none .   All other systems are reviewed and negative.    PHYSICAL EXAM: VS:  BP (!) 154/75   Pulse 86   Ht _0  (1.651 m)   Wt 181 lb 3.2 oz (82.2 kg)   SpO2 96%   BMI 30.15 kg/m  , BMI Body mass index is 30.15 kg/m. GEN:  No distress NECK:  No jugular venous distention at 90 degrees, waveform within normal limits, carotid upstroke brisk and symmetric, no bruits, no thyromegaly LYMPHATICS:  No cervical adenopathy LUNGS:  Clear to auscultation bilaterally BACK:  No CVA tenderness CHEST:  Unremarkable HEART:  S1 and S2 within normal limits, no S3, no S4, no clicks, no rubs, 3 out of 6 apical systolic murmur radiating up the aortic outflow tract and  mid-to-late peaking, no diastolic murmurs ABD:  Positive bowel sounds normal in frequency in pitch, no bruits, no rebound, no guarding, unable to assess midline mass or bruit with the patient seated. EXT:  2 plus pulses throughout, no cyanosis no clubbing, status post bilateral BKA's SKIN:  No rashes no nodules NEURO:  Cranial nerves II through XII grossly intact, motor grossly intact throughout PSYCH:  Cognitively intact, oriented to person place and time   EKG:  EKG is not ordered today. NA  Recent Labs: 01/18/2021: BUN 53; Creatinine, Ser 2.83; Potassium 4.3; Sodium 140    Lipid Panel    Component Value Date/Time   CHOL 113 08/30/2013 0550   TRIG 149 08/30/2013 0550   HDL 36 (L) 08/30/2013 0550   CHOLHDL 3.1 08/30/2013 0550   VLDL 30 08/30/2013 0550   LDLCALC 47 08/30/2013 0550      Wt Readings from Last 3 Encounters:  01/31/21 181 lb 3.2 oz (82.2 kg)  01/18/21 188 lb 9.6 oz (85.5 kg)  11/15/19 188 lb (85.3 kg)      Other studies Reviewed: Additional studies/ records that were reviewed today include: Echo Review of the above records demonstrates:  Please see elsewhere in the note.     ASSESSMENT AND PLAN:  ACUTE ON CHRONIC SYSTOLIC HF: She eventually needs cardiac catheterization.  However, she first needs to have her renal function optimized.  I recheck the basic metabolic profile today.  However, she also needs to see nephrology.  She seems to be euvolemic.  Today I am going to increase her carvedilol although she is not really clear what dose she is taking.  She is going to call us back as I think she is taking 6.25 twice daily and I would like to double dose.   AS: We went through this physiology again today.  She eventually will need right and left heart catheterization and assessment for possible TAVR.    HTN:   This is being managed in the context of treating his CHF  CAD/CABG:  The patient has no new sypmtoms.  No further cardiovascular testing is indicated.  We will continue with aggressive risk reduction and meds as listed.  MITRAL REGURGITATION:   Final echo is pending.   AKI:  I have placed a request for nephrology appointment.  She apparently saw them about a year ago.  Current medicines adoes not havere reviewed at length with the patient today.  The patient  concerns regarding medicines.  The following changes have been made: As above  Labs/ tests ordered today include: Basic metabolic profile and CBC  Orders Placed This Encounter  Procedures   Ambulatory referral to Nephrology     Disposition:   FU with me after the nephrology appointment.   Signed, Minus Breeding, MD  01/31/2021 5:11 PM    [place]

## 2021-01-29 NOTE — Telephone Encounter (Signed)
LCSW has scheduled a door to door ride through [employer] for pt to get to appt on 11/17. This is still tentative, and subject to availability due to distance between pt and clinic; and since it is quite close to her appt date. I attempted to reach pt to explain the above and go over program with her. When I called the listed preferred number of [phone] it went straight to voicemail. I left a message requesting a call back. Will re-attempt tomorrow to contact pt, and if ride unable to be scheduled due to above challenges then pt will need to be rescheduled.   Westley Hummer, MSW, [place]  [phone]

## 2021-01-29 NOTE — Telephone Encounter (Signed)
Call transferred into triage- she is stating that she is having left sided cramping, when she takes a deep breath and when moving to the left side- I did try to advise her that if she can replicate that pain it is not normally cardiac pains, patient has no other complaints at this time. She is anxious and was requested to be seen, she has ECHO scheduled for tomorrow, and appointment on 11/17 with Dr.Hochrein but her transportation (son) is unable to bring her. She is an amputee and unable to drive herself to appointments. I did mention about [facility] but unsure about her address and if this was appropriate we would let her know. I did not cancel the appointment, due to seeing about possible transportation. I advised with patient if we had to reschedule I would route to MD/RN to review and see best place to move her too if possible.   Patient states she is feeling better since speaking with me, but is still anxious to see MD.   Thanks!

## 2021-01-29 NOTE — Telephone Encounter (Signed)
Pt c/o of Chest Pain: STAT if CP now or developed within 24 hours  1. Are you having CP right now? Yes  2. Are you experiencing any other symptoms (ex. SOB, nausea, vomiting, sweating)? Diarrhea, hurts when breathing  3. How long have you been experiencing CP? 2 days  4. Is your CP continuous or coming and going? Coming and going  5. Have you taken Nitroglycerin? No  ?

## 2021-01-30 ENCOUNTER — Other Ambulatory Visit: Payer: Self-pay

## 2021-01-30 ENCOUNTER — Ambulatory Visit (INDEPENDENT_AMBULATORY_CARE_PROVIDER_SITE_OTHER): Payer: Medicare Other

## 2021-01-30 DIAGNOSIS — I429 Cardiomyopathy, unspecified: Secondary | ICD-10-CM | POA: Diagnosis not present

## 2021-01-30 NOTE — Telephone Encounter (Signed)
LCSW received call back from pt at end of clinic day - I introduced self, role, reason for call. I explained the structure of transportation services and how it works. Confirmed she has a ride w/ son tomorrow. She understood that transportation services would do their best for pt to be able to get to appt.   This morning 11/16 I was able to speak w/ Darrick Meigs, Solicitor. He will confirm ride w/ pt today and let me know if any issues.   Westley Hummer, MSW, [place]  [phone]

## 2021-01-31 ENCOUNTER — Encounter: Payer: Self-pay | Admitting: Cardiology

## 2021-01-31 ENCOUNTER — Ambulatory Visit: Payer: Medicare Other | Admitting: Cardiology

## 2021-01-31 ENCOUNTER — Telehealth: Payer: Self-pay | Admitting: Licensed Clinical Social Worker

## 2021-01-31 VITALS — BP 154/75 | HR 86 | Ht 65.0 in | Wt 181.2 lb

## 2021-01-31 DIAGNOSIS — I5023 Acute on chronic systolic (congestive) heart failure: Secondary | ICD-10-CM | POA: Diagnosis not present

## 2021-01-31 NOTE — Telephone Encounter (Signed)
LCSW received call from pt- she now states she does have a ride (her elder son) to bring her to appt today. I clarified time and location again and she will be here. I requested she call Transportation Services now and cancel ride; she understands she may have to wait on hold- she is appreciative of the option as needed moving forward.   Westley Hummer, MSW, [place]  [phone]

## 2021-01-31 NOTE — Patient Instructions (Signed)
Medication Instructions:  -Please call us back to confirm dosage of carvedilol (coreg).   Your physician recommends that you continue on your current medications as directed. Please refer to the Current Medication list given to you today.   *If you need a refill on your cardiac medications before your next appointment, please call your pharmacy*   Lab Work: Your physician recommends that you have labs drawn today: BMET and CBC  If you have labs (blood work) drawn today and your tests are completely normal, you will receive your results only by: [place] (if you have MyChart) OR A paper copy in the mail If you have any lab test that is abnormal or we need to change your treatment, we will call you to review the results.   Follow-Up: At [facility], you and your health needs are our priority.  As part of our continuing mission to provide you with exceptional heart care, we have created designated Provider Care Teams.  These Care Teams include your primary Cardiologist (physician) and Advanced Practice Providers (APPs -  Physician Assistants and Nurse Practitioners) who all work together to provide you with the care you need, when you need it.  We recommend signing up for the patient portal called "MyChart".  Sign up information is provided on this After Visit Summary.  MyChart is used to connect with patients for Virtual Visits (Telemedicine).  Patients are able to view lab/test results, encounter notes, upcoming appointments, etc.  Non-urgent messages can be sent to your provider as well.   To learn more about what you can do with MyChart, go to [email or web address].    Your next appointment:   2 month(s)  The format for your next appointment:   In Person  Provider:   Minus Breeding, MD

## 2021-02-01 ENCOUNTER — Telehealth: Payer: Self-pay | Admitting: Cardiology

## 2021-02-01 LAB — BASIC METABOLIC PANEL
BUN/Creatinine Ratio: 23 (ref 12–28)
BUN: 49 mg/dL — ABNORMAL HIGH (ref 8–27)
CO2: 28 mmol/L (ref 20–29)
Calcium: 9.2 mg/dL (ref 8.7–10.3)
Chloride: 99 mmol/L (ref 96–106)
Creatinine, Ser: 2.15 mg/dL — ABNORMAL HIGH (ref 0.57–1.00)
Glucose: 166 mg/dL — ABNORMAL HIGH (ref 70–99)
Potassium: 4.7 mmol/L (ref 3.5–5.2)
Sodium: 142 mmol/L (ref 134–144)
eGFR: 25 mL/min/{1.73_m2} — ABNORMAL LOW (ref >59)

## 2021-02-01 LAB — CBC
Hematocrit: 28.2 % — ABNORMAL LOW (ref 34.0–46.6)
Hemoglobin: 9.3 g/dL — ABNORMAL LOW (ref 11.1–15.9)
MCH: 28.6 pg (ref 26.6–33.0)
MCHC: 33 g/dL (ref 31.5–35.7)
MCV: 87 fL (ref 79–97)
Platelets: 163 10*3/uL (ref 150–450)
RBC: 3.25 x10E6/uL — ABNORMAL LOW (ref 3.77–5.28)
RDW: 17.4 % — ABNORMAL HIGH (ref 11.7–15.4)
WBC: 4.5 10*3/uL (ref 3.4–10.8)

## 2021-02-01 NOTE — Telephone Encounter (Signed)
  Dr Percival Spanish asked patient to call to let us know what dosage she was taking of the Carvedilol. It was 6.25 mg once daily.

## 2021-02-01 NOTE — Telephone Encounter (Signed)
Med list updated. Routed to MD as Juluis Rainier

## 2021-02-04 DIAGNOSIS — H16232 Neurotrophic keratoconjunctivitis, left eye: Secondary | ICD-10-CM | POA: Diagnosis not present

## 2021-02-04 DIAGNOSIS — H16231 Neurotrophic keratoconjunctivitis, right eye: Secondary | ICD-10-CM | POA: Diagnosis not present

## 2021-02-04 DIAGNOSIS — H16223 Keratoconjunctivitis sicca, not specified as Sjogren's, bilateral: Secondary | ICD-10-CM | POA: Diagnosis not present

## 2021-02-04 LAB — ECHOCARDIOGRAM COMPLETE
AR max vel: 0.69 cm2
AV Area VTI: 0.63 cm2
AV Area mean vel: 0.62 cm2
AV Mean grad: 25 mmHg
AV Peak grad: 43.8 mmHg
Ao pk vel: 3.31 m/s
MV M vel: 5.15 m/s
MV Peak grad: 106.1 mmHg
MV VTI: 1.03 cm2
S' Lateral: 3.22 cm

## 2021-02-04 NOTE — Telephone Encounter (Signed)
Minus Breeding, MD  You 11 minutes ago (11:24 AM)   Increase the Coreg to 6.25 mg BID.

## 2021-02-04 NOTE — Telephone Encounter (Signed)
Left message to call back  

## 2021-02-06 MED ORDER — CARVEDILOL 6.25 MG PO TABS: 6.2500 mg | ORAL_TABLET | Freq: Two times a day (BID) | ORAL | 3 refills | Status: DC

## 2021-02-06 NOTE — Telephone Encounter (Signed)
Spoke to patient Dr.Hochrein advised to increase Carvedilol to 6.25 mg twice a day.Advised to continue to monitor B/P and call back if elevated.

## 2021-02-14 ENCOUNTER — Encounter: Payer: Self-pay | Admitting: *Deleted

## 2021-02-19 ENCOUNTER — Emergency Department (HOSPITAL_BASED_OUTPATIENT_CLINIC_OR_DEPARTMENT_OTHER): Payer: Medicare Other

## 2021-02-19 ENCOUNTER — Encounter (HOSPITAL_BASED_OUTPATIENT_CLINIC_OR_DEPARTMENT_OTHER): Payer: Self-pay | Admitting: Emergency Medicine

## 2021-02-19 ENCOUNTER — Other Ambulatory Visit: Payer: Self-pay

## 2021-02-19 ENCOUNTER — Inpatient Hospital Stay (HOSPITAL_BASED_OUTPATIENT_CLINIC_OR_DEPARTMENT_OTHER)
Admission: EM | Admit: 2021-02-19 | Discharge: 2021-02-22 | DRG: 177 | Disposition: A | Discharge status: Home / Self Care | Payer: Medicare Other | Attending: Family Medicine | Admitting: Family Medicine

## 2021-02-19 DIAGNOSIS — J9602 Acute respiratory failure with hypercapnia: Secondary | ICD-10-CM | POA: Diagnosis not present

## 2021-02-19 DIAGNOSIS — K219 Gastro-esophageal reflux disease without esophagitis: Secondary | ICD-10-CM | POA: Diagnosis present

## 2021-02-19 DIAGNOSIS — G9341 Metabolic encephalopathy: Secondary | ICD-10-CM | POA: Diagnosis present

## 2021-02-19 DIAGNOSIS — I2510 Atherosclerotic heart disease of native coronary artery without angina pectoris: Secondary | ICD-10-CM | POA: Diagnosis present

## 2021-02-19 DIAGNOSIS — G40209 Localization-related (focal) (partial) symptomatic epilepsy and epileptic syndromes with complex partial seizures, not intractable, without status epilepticus: Secondary | ICD-10-CM | POA: Diagnosis present

## 2021-02-19 DIAGNOSIS — F411 Generalized anxiety disorder: Secondary | ICD-10-CM | POA: Diagnosis present

## 2021-02-19 DIAGNOSIS — I5023 Acute on chronic systolic (congestive) heart failure: Secondary | ICD-10-CM | POA: Diagnosis not present

## 2021-02-19 DIAGNOSIS — L89892 Pressure ulcer of other site, stage 2: Secondary | ICD-10-CM | POA: Diagnosis not present

## 2021-02-19 DIAGNOSIS — Z83438 Family history of other disorder of lipoprotein metabolism and other lipidemia: Secondary | ICD-10-CM

## 2021-02-19 DIAGNOSIS — Z91048 Other nonmedicinal substance allergy status: Secondary | ICD-10-CM

## 2021-02-19 DIAGNOSIS — M1990 Unspecified osteoarthritis, unspecified site: Secondary | ICD-10-CM | POA: Diagnosis present

## 2021-02-19 DIAGNOSIS — R0602 Shortness of breath: Secondary | ICD-10-CM | POA: Diagnosis not present

## 2021-02-19 DIAGNOSIS — I350 Nonrheumatic aortic (valve) stenosis: Secondary | ICD-10-CM | POA: Diagnosis present

## 2021-02-19 DIAGNOSIS — N184 Chronic kidney disease, stage 4 (severe): Secondary | ICD-10-CM | POA: Diagnosis present

## 2021-02-19 DIAGNOSIS — N1832 Chronic kidney disease, stage 3b: Secondary | ICD-10-CM | POA: Diagnosis not present

## 2021-02-19 DIAGNOSIS — F410 Panic disorder [episodic paroxysmal anxiety] without agoraphobia: Secondary | ICD-10-CM | POA: Diagnosis present

## 2021-02-19 DIAGNOSIS — Z8249 Family history of ischemic heart disease and other diseases of the circulatory system: Secondary | ICD-10-CM

## 2021-02-19 DIAGNOSIS — E1122 Type 2 diabetes mellitus with diabetic chronic kidney disease: Secondary | ICD-10-CM | POA: Diagnosis not present

## 2021-02-19 DIAGNOSIS — Z89512 Acquired absence of left leg below knee: Secondary | ICD-10-CM

## 2021-02-19 DIAGNOSIS — G9340 Encephalopathy, unspecified: Secondary | ICD-10-CM

## 2021-02-19 DIAGNOSIS — E785 Hyperlipidemia, unspecified: Secondary | ICD-10-CM | POA: Diagnosis not present

## 2021-02-19 DIAGNOSIS — I130 Hypertensive heart and chronic kidney disease with heart failure and stage 1 through stage 4 chronic kidney disease, or unspecified chronic kidney disease: Secondary | ICD-10-CM | POA: Diagnosis not present

## 2021-02-19 DIAGNOSIS — I4719 Other supraventricular tachycardia: Secondary | ICD-10-CM | POA: Diagnosis present

## 2021-02-19 DIAGNOSIS — U071 COVID-19: Secondary | ICD-10-CM | POA: Diagnosis not present

## 2021-02-19 DIAGNOSIS — Z951 Presence of aortocoronary bypass graft: Secondary | ICD-10-CM | POA: Diagnosis not present

## 2021-02-19 DIAGNOSIS — E1165 Type 2 diabetes mellitus with hyperglycemia: Secondary | ICD-10-CM

## 2021-02-19 DIAGNOSIS — Z89511 Acquired absence of right leg below knee: Secondary | ICD-10-CM

## 2021-02-19 DIAGNOSIS — Z87440 Personal history of urinary (tract) infections: Secondary | ICD-10-CM

## 2021-02-19 DIAGNOSIS — E1151 Type 2 diabetes mellitus with diabetic peripheral angiopathy without gangrene: Secondary | ICD-10-CM | POA: Diagnosis present

## 2021-02-19 DIAGNOSIS — I517 Cardiomegaly: Secondary | ICD-10-CM | POA: Diagnosis not present

## 2021-02-19 DIAGNOSIS — I10 Essential (primary) hypertension: Secondary | ICD-10-CM | POA: Diagnosis present

## 2021-02-19 DIAGNOSIS — R9431 Abnormal electrocardiogram [ECG] [EKG]: Secondary | ICD-10-CM | POA: Diagnosis not present

## 2021-02-19 DIAGNOSIS — I447 Left bundle-branch block, unspecified: Secondary | ICD-10-CM | POA: Diagnosis present

## 2021-02-19 DIAGNOSIS — G40909 Epilepsy, unspecified, not intractable, without status epilepticus: Secondary | ICD-10-CM

## 2021-02-19 DIAGNOSIS — I471 Supraventricular tachycardia: Secondary | ICD-10-CM | POA: Diagnosis present

## 2021-02-19 DIAGNOSIS — I248 Other forms of acute ischemic heart disease: Secondary | ICD-10-CM | POA: Diagnosis present

## 2021-02-19 DIAGNOSIS — Z794 Long term (current) use of insulin: Secondary | ICD-10-CM | POA: Diagnosis not present

## 2021-02-19 DIAGNOSIS — Z820 Family history of epilepsy and other diseases of the nervous system: Secondary | ICD-10-CM

## 2021-02-19 DIAGNOSIS — T380X5A Adverse effect of glucocorticoids and synthetic analogues, initial encounter: Secondary | ICD-10-CM | POA: Diagnosis present

## 2021-02-19 DIAGNOSIS — J9601 Acute respiratory failure with hypoxia: Secondary | ICD-10-CM | POA: Diagnosis not present

## 2021-02-19 DIAGNOSIS — R778 Other specified abnormalities of plasma proteins: Secondary | ICD-10-CM | POA: Diagnosis present

## 2021-02-19 DIAGNOSIS — G894 Chronic pain syndrome: Secondary | ICD-10-CM | POA: Diagnosis not present

## 2021-02-19 DIAGNOSIS — R0902 Hypoxemia: Secondary | ICD-10-CM | POA: Diagnosis not present

## 2021-02-19 DIAGNOSIS — E1169 Type 2 diabetes mellitus with other specified complication: Secondary | ICD-10-CM | POA: Diagnosis present

## 2021-02-19 DIAGNOSIS — Z79899 Other long term (current) drug therapy: Secondary | ICD-10-CM

## 2021-02-19 DIAGNOSIS — Z833 Family history of diabetes mellitus: Secondary | ICD-10-CM

## 2021-02-19 DIAGNOSIS — L89151 Pressure ulcer of sacral region, stage 1: Secondary | ICD-10-CM | POA: Diagnosis not present

## 2021-02-19 DIAGNOSIS — Z981 Arthrodesis status: Secondary | ICD-10-CM

## 2021-02-19 DIAGNOSIS — Z7902 Long term (current) use of antithrombotics/antiplatelets: Secondary | ICD-10-CM

## 2021-02-19 DIAGNOSIS — R410 Disorientation, unspecified: Secondary | ICD-10-CM | POA: Diagnosis not present

## 2021-02-19 DIAGNOSIS — E1141 Type 2 diabetes mellitus with diabetic mononeuropathy: Secondary | ICD-10-CM | POA: Diagnosis not present

## 2021-02-19 DIAGNOSIS — Z885 Allergy status to narcotic agent status: Secondary | ICD-10-CM

## 2021-02-19 LAB — CBC
HCT: 28.2 % — ABNORMAL LOW (ref 36.0–46.0)
Hemoglobin: 8.8 g/dL — ABNORMAL LOW (ref 12.0–15.0)
MCH: 28 pg (ref 26.0–34.0)
MCHC: 31.2 g/dL (ref 30.0–36.0)
MCV: 89.8 fL (ref 80.0–100.0)
Platelets: 141 10*3/uL — ABNORMAL LOW (ref 150–400)
RBC: 3.14 MIL/uL — ABNORMAL LOW (ref 3.87–5.11)
RDW: 16.8 % — ABNORMAL HIGH (ref 11.5–15.5)
WBC: 6.2 10*3/uL (ref 4.0–10.5)
nRBC: 0 % (ref 0.0–0.2)

## 2021-02-19 LAB — LACTIC ACID, PLASMA
Lactic Acid, Venous: 1.1 mmol/L (ref 0.5–1.9)
Lactic Acid, Venous: 1 mmol/L (ref 0.5–1.9)

## 2021-02-19 LAB — URINALYSIS, ROUTINE W REFLEX MICROSCOPIC
Bilirubin Urine: NEGATIVE
Glucose, UA: >1000 mg/dL — AB
Hgb urine dipstick: NEGATIVE
Ketones, ur: NEGATIVE mg/dL
Leukocytes,Ua: NEGATIVE
Nitrite: NEGATIVE
Specific Gravity, Urine: 1.006 (ref 1.005–1.030)
pH: 5 (ref 5.0–8.0)

## 2021-02-19 LAB — CBG MONITORING, ED: Glucose-Capillary: 146 mg/dL — ABNORMAL HIGH (ref 70–99)

## 2021-02-19 LAB — BASIC METABOLIC PANEL
Anion gap: 13 (ref 5–15)
BUN: 49 mg/dL — ABNORMAL HIGH (ref 8–23)
CO2: 27 mmol/L (ref 22–32)
Calcium: 9.3 mg/dL (ref 8.9–10.3)
Chloride: 100 mmol/L (ref 98–111)
Creatinine, Ser: 1.98 mg/dL — ABNORMAL HIGH (ref 0.44–1.00)
GFR, Estimated: 28 mL/min — ABNORMAL LOW (ref >60)
Glucose, Bld: 165 mg/dL — ABNORMAL HIGH (ref 70–99)
Potassium: 4 mmol/L (ref 3.5–5.1)
Sodium: 140 mmol/L (ref 135–145)

## 2021-02-19 LAB — RESP PANEL BY RT-PCR (FLU A&B, COVID) ARPGX2
Influenza A by PCR: NEGATIVE
Influenza B by PCR: NEGATIVE
SARS Coronavirus 2 by RT PCR: POSITIVE — AB

## 2021-02-19 MED ORDER — SODIUM CHLORIDE 0.9 % IV SOLN
100.0000 mg | Freq: Once | INTRAVENOUS | Status: AC
  Administered 2021-02-19: 100 mg via INTRAVENOUS

## 2021-02-19 MED ORDER — ACETAMINOPHEN 325 MG PO TABS: 650.0000 mg | ORAL_TABLET | Freq: Once | ORAL | Status: DC

## 2021-02-19 MED ORDER — REMDESIVIR 100 MG IV SOLR
100.0000 mg | Freq: Every day | INTRAVENOUS | Status: DC
Start: 2021-02-20 — End: 2021-02-20
  Filled 2021-02-19: qty 20

## 2021-02-19 MED ORDER — METHYLPREDNISOLONE SODIUM SUCC 125 MG IJ SOLR
1.0000 mg/kg | Freq: Two times a day (BID) | INTRAMUSCULAR | Status: DC
  Administered 2021-02-19: 82.5 mg via INTRAVENOUS
  Filled 2021-02-19: qty 2

## 2021-02-19 MED ORDER — ACETAMINOPHEN 325 MG PO TABS
ORAL_TABLET | ORAL | Status: AC
  Filled 2021-02-19: qty 2

## 2021-02-19 NOTE — ED Notes (Signed)
ED Provider, Karle Starch at bedside.

## 2021-02-19 NOTE — ED Provider Notes (Signed)
[facility] Provider Note  CSN: [id number] Arrival date & time: 02/19/21 1715    History Chief Complaint  Patient presents with   Altered Mental Status    [name] is a 65 y.o. female with history of multiple medical problems brought to the ED by her son for evaluation of AMS, worsening for the last several days, associated with a strong smell to her urine. She has had similar with UTI in the past. Patient denies any falls but reports she has been very weak lately. She has not had any CP, SOB, vomiting or diarrhea. She is unable to give many other details and defers most questions to her son. Level 5 caveat applies.    Past Medical History:  Diagnosis Date   Acute congestive heart failure 03/29/2018   Acute encephalopathy 09/02/2013   Anemia, normocytic normochromic 03/14/2008   Arthritis    "hands"   Atherosclerosis of native arteries of the extremities with ulceration 07/14/2013   Atherosclerosis of native artery of extremity with intermittent claudication 09/16/2011   CAD (coronary artery disease) of artery bypass graft 03/14/2008   Chronic kidney disease (CKD) 12/26/2015   stage 3 due to type 2 diabetes mellitus   Chronic pain syndrome 81/15/7262   Complication of anesthesia    pt felt like she had a hard time waking up after surgery in Feb. 2015.   Coronary artery disease involving native coronary artery without angina pectoris 09/25/2015   Essential hypertension 03/14/2008   Gastroesophageal reflux disease with esophagitis 07/09/2016   Generalized anxiety disorder with panic attacks    History of one panic attack   GERD (gastroesophageal reflux disease)    takes Zantac   Hyperlipidemia, unspecified 03/14/2008   Localization-related symptomatic epilepsy and epileptic syndromes with complex partial seizures, not intractable, without status epilepticus 06/27/2014   Major depressive disorder 03/14/2008   Neuropathy    Osteomyelitis  of ankle or foot, left, acute 06/17/2013   Peripheral vascular disease    PVD (peripheral vascular disease) 03/25/2012   Type 2 diabetes mellitus with diabetic mononeuropathy, with long-term current use of insulin 03/14/2008    Past Surgical History:  Procedure Laterality Date   AMPUTATION Left 08/22/2013   Procedure: LEFT BELOW KNEE AMPUTATION;  Surgeon: Newt Minion, MD;  Location: [facility];  Service: Orthopedics;  Laterality: Left;   ANKLE FUSION Left 05/11/2013   TIBIOCALCANEAL FUSION    ANKLE FUSION Left 05/12/2013   Procedure: LEFT TIBIOCALCANEAL FUSION;  Surgeon: Newt Minion, MD;  Location: [place];  Service: Orthopedics;  Laterality: Left;  Left Tibiocalcaneal Fusion   APPLICATION OF WOUND VAC  06/17/2013   Procedure: APPLICATION OF WOUND VAC;  Surgeon: Newt Minion, MD;  Location: [facility];  Service: Orthopedics;;   BELOW KNEE LEG AMPUTATION Right Jan. 2008   CAROTID ENDARTERECTOMY Right    CATARACT EXTRACTION Left 12/25/14   CHOLECYSTECTOMY     COLONOSCOPY     CORONARY ARTERY BYPASS GRAFT  12/01/2007   "CABG X4" (05/12/2013)   DILATION AND CURETTAGE OF UTERUS     I & D EXTREMITY Left 06/17/2013   Procedure: IRRIGATION AND DEBRIDEMENT EXTREMITY, PLACEMENT ANTIBIOTIC STIMULAN BEADS;  Surgeon: Newt Minion, MD;  Location: [facility];  Service: Orthopedics;  Laterality: Left;   INTRAMEDULLARY (IM) NAIL INTERTROCHANTERIC Left 08/20/2018   Procedure: INTRAMEDULLARY (IM) NAIL INTERTROCHANTRIC;  Surgeon: Shona Needles, MD;  Location: [facility];  Service: Orthopedics;  Laterality: Left;   IRRIGATION AND DEBRIDEMENT ABSCESS Left 06/17/2013  ANKLE           DR [name]   TOE AMPUTATION Right    "took off a couple toes before the amputation"   TONSILLECTOMY      Family History  Problem Relation Age of Onset   Cancer Mother    Heart disease Father    Diabetes Father    Hyperlipidemia Father    Hypertension Father    Alzheimer's disease Maternal Grandfather     Social History   Tobacco Use    Smoking status: Never   Smokeless tobacco: Never  Vaping Use   Vaping Use: Never used  Substance Use Topics   Alcohol use: No   Drug use: No     Home Medications Prior to Admission medications   Medication Sig Start Date End Date Taking? Authorizing Provider  acetaminophen-codeine (TYLENOL #3) 300-30 MG tablet 1 tablet as needed 09/06/20   [provider]  AgaMatrix Ultra-Thin Lancets MISC See admin instructions. 08/31/17   [provider]  albuterol (VENTOLIN HFA) 108 (90 Base) MCG/ACT inhaler Inhale 1-2 puffs into the lungs every 6 (six) hours as needed for wheezing or shortness of breath.  05/13/17   [provider]  ALPRAZolam Duanne Moron) 0.5 MG tablet 1 tablet 09/06/19   [provider]  aspirin 81 MG tablet Take 1 tablet (81 mg total) by mouth daily. 05/13/17   Libby Maw, MD  Blood Glucose Monitoring Suppl (FIFTY50 GLUCOSE METER 2.0) w/Device KIT See admin instructions. 11/21/20 11/21/21  [provider]  carvedilol (COREG) 6.25 MG tablet Take 1 tablet (6.25 mg total) by mouth 2 (two) times daily. 02/06/21   Minus Breeding, MD  clopidogrel (PLAVIX) 75 MG tablet Take 1 tablet (75 mg total) by mouth daily. 05/13/17   Libby Maw, MD  Continuous Blood Gluc Sensor (FREESTYLE LIBRE 14 DAY SENSOR) MISC See admin instructions. 11/26/20   [provider]  empagliflozin (JARDIANCE) 10 MG TABS tablet Take by mouth daily.    [provider]  fluconazole (DIFLUCAN) 150 MG tablet Take 150 mg by mouth daily.    [provider]  fluconazole (DIFLUCAN) 150 MG tablet 1 tablet 09/24/20   [provider]  insulin lispro (HUMALOG) 100 UNIT/ML injection Inject into the skin. 11/21/20   [provider]  LANTUS SOLOSTAR 100 UNIT/ML Solostar Pen Inject 70 Units into the skin daily. Taking in the evening 07/13/18   [provider]  Levetiracetam 750 MG TB24 Take 1,500 mg by mouth at bedtime. 09/24/20    [provider]  lidocaine (LIDODERM) 5 % Place 1 patch onto the skin daily. evening 07/16/18   [provider]  Multiple Vitamins-Minerals (MULTIVITAMINS THER. W/MINERALS) TABS Take 1 tablet by mouth daily.      [provider]  mupirocin ointment (BACTROBAN) 2 % 1 application 2 (two) times daily.    [provider]  nitroGLYCERIN (NITROSTAT) 0.4 MG SL tablet Place 0.4 mg under the tongue every 5 (five) minutes as needed for chest pain.    [provider]  ONETOUCH ULTRA test strip CHECK BLOOD SUGAR ONCE A DAY 10/11/20   [provider]  potassium chloride SA (KLOR-CON) 20 MEQ tablet Take 20 mEq by mouth 2 (two) times daily.    [provider]  ranolazine (RANEXA) 500 MG 12 hr tablet Take 1,000 mg by mouth 2 (two) times daily.    [provider]  rosuvastatin (CRESTOR) 40 MG tablet Take 40 mg by mouth daily.  [provider]  sertraline (ZOLOFT) 100 MG tablet Take 1 tablet (100 mg total) by mouth 2 (two) times daily. 05/13/17   Libby Maw, MD  torsemide (DEMADEX) 20 MG tablet Take 20 mg by mouth daily.    [provider]     Allergies    Metformin hcl, Ace inhibitors, Actos [pioglitazone], Atorvastatin, Candesartan cilexetil, Gabapentin, Isosorbide nitrate, Quinapril hcl, Tramadol hcl, and Tape   Review of Systems   Review of Systems Unable to assess due to mental status.    Physical Exam BP 114/76 (BP Location: Left Arm)   Pulse 98   Temp 100.1 F (37.8 C)   Resp 13   Ht _0  (1.651 m)   Wt 82.2 kg   SpO2 92%   BMI 30.15 kg/m   Physical Exam Vitals and nursing note reviewed.  Constitutional:      Appearance: Normal appearance.  HENT:     Head: Normocephalic and atraumatic.     Nose: Nose normal.     Mouth/Throat:     Mouth: Mucous membranes are moist.  Eyes:     Extraocular Movements: Extraocular movements intact.     Conjunctiva/sclera: Conjunctivae normal.   Cardiovascular:     Rate and Rhythm: Normal rate.  Pulmonary:     Effort: Pulmonary effort is normal.     Breath sounds: Normal breath sounds.  Abdominal:     General: Abdomen is flat.     Palpations: Abdomen is soft.     Tenderness: There is no abdominal tenderness.  Musculoskeletal:        General: No swelling. Normal range of motion.     Cervical back: Neck supple.     Comments: BLE amputations  Skin:    General: Skin is warm and dry.  Neurological:     General: No focal deficit present.     Mental Status: She is alert. She is disoriented.  Psychiatric:        Mood and Affect: Mood normal.     ED Results / Procedures / Treatments   Labs (all labs ordered are listed, but only abnormal results are displayed) Labs Reviewed  RESP PANEL BY RT-PCR (FLU A&B, COVID) ARPGX2 - Abnormal; Notable for the following components:      Result Value   SARS Coronavirus 2 by RT PCR POSITIVE (*)    All other components within normal limits  BASIC METABOLIC PANEL - Abnormal; Notable for the following components:   Glucose, Bld 165 (*)    BUN 49 (*)    Creatinine, Ser 1.98 (*)    GFR, Estimated 28 (*)    All other components within normal limits  CBC - Abnormal; Notable for the following components:   RBC 3.14 (*)    Hemoglobin 8.8 (*)    HCT 28.2 (*)    RDW 16.8 (*)    Platelets 141 (*)    All other components within normal limits  URINALYSIS, ROUTINE W REFLEX MICROSCOPIC - Abnormal; Notable for the following components:   Color, Urine COLORLESS (*)    Glucose, UA >1,000 (*)    Protein, ur TRACE (*)    All other components within normal limits  CBG MONITORING, ED - Abnormal; Notable for the following components:   Glucose-Capillary 146 (*)    All other components within normal limits  CULTURE, BLOOD (ROUTINE X 2)  CULTURE, BLOOD (ROUTINE X 2)  LACTIC ACID, PLASMA  LACTIC ACID, PLASMA    EKG EKG Interpretation  Date/Time:  Tuesday February 19 2021 18:19:06 EST Ventricular  Rate:  99 PR Interval:  172 QRS Duration: 159 QT Interval:  400 QTC Calculation: 514 R Axis:   93 Text Interpretation: Sinus rhythm Nonspecific intraventricular conduction delay Since last tracing QRS duration has increased Otherwise no significant change Confirmed by Calvert Cantor [phone]) on 02/19/2021 6:27:36 PM   Radiology CT Head Wo Contrast  Result Date: 02/19/2021 CLINICAL DATA:  Mental status change, increasing confusion, EXAM: CT HEAD WITHOUT CONTRAST TECHNIQUE: Contiguous axial images were obtained from the base of the skull through the vertex without intravenous contrast. COMPARISON:  09/02/2013 CT head FINDINGS: Brain: No evidence of acute infarction, hemorrhage, cerebral edema, mass, mass effect, or midline shift. No hydrocephalus or extra-axial fluid collection. Periventricular white matter changes, likely the sequela of chronic small vessel ischemic disease. Vascular: No hyperdense vessel. Skull: Normal. Negative for fracture or focal lesion. Sinuses/Orbits: Mucous retention cyst in the right sphenoid sinus. Mild mucosal thickening in the ethmoid air cells. Status post bilateral lens replacements. Other: The mastoid air cells are well aerated. IMPRESSION: IMPRESSION No acute intracranial process. No etiology is seen for the patient's altered mental status. Electronically Signed   By: Merilyn Baba M.D.   On: 02/19/2021 18:59   DG Chest Port 1 View  Result Date: 02/19/2021 CLINICAL DATA:  COVID hypoxia EXAM: PORTABLE CHEST 1 VIEW COMPARISON:  11/16/2020 FINDINGS: Post sternotomy changes. Cardiomegaly. No focal opacity, pleural effusion or pneumothorax. No overt pulmonary edema. IMPRESSION: No active disease.  Cardiomegaly Electronically Signed   By: Donavan Foil M.D.   On: 02/19/2021 20:06    Procedures .Critical Care Performed by: Truddie Hidden, MD Authorized by: Truddie Hidden, MD   Critical care provider statement:    Critical care time (minutes):  30   Critical  care time was exclusive of:  Separately billable procedures and treating other patients   Critical care was necessary to treat or prevent imminent or life-threatening deterioration of the following conditions:  CNS failure or compromise and respiratory failure   Critical care was time spent personally by me on the following activities:  Development of treatment plan with patient or surrogate, discussions with consultants, evaluation of patient's response to treatment, examination of patient, ordering and review of laboratory studies, ordering and review of radiographic studies, ordering and performing treatments and interventions, pulse oximetry, re-evaluation of patient's condition and review of old charts   Care discussed with: admitting provider    Medications Ordered in the ED Medications  methylPREDNISolone sodium succinate (SOLU-MEDROL) 125 mg/2 mL injection 82.5 mg (has no administration in time range)     MDM Rules/Calculators/A&P MDM Patient with AMS, low grade fever on arrival here, but otherwise normal vitals. UA with glucosuria but no other signs of infection. Will add cultures and lactic acid. CT head. Awaiting other labs. EKG with IVCD but no other symptoms concerning for ischemia.   ED Course  I have reviewed the triage vital signs and the nursing notes.  Pertinent labs & imaging results that were available during my care of the patient were reviewed by me and considered in my medical decision making (see chart for details).  Clinical Course as of 02/19/21 2009  Tue Feb 19, 2021  1908 BMP with CKD at baseline.  [CS]  1915 Covid is positive which may explain her fever and other symptoms.  [CS]  1927 Lactic acid is normal.  [CS]  1936 CT head is neg. Patient's SpO2 is mildly low at times. Will start  her on [place] oxygen, add CXR. Likely will need admission.  [CS]  2008 Spoke with Dr. Tonie Griffith, Hospitalist, who will accept for admission.  [CS]    Clinical Course User Index [CS]  Truddie Hidden, MD    Final Clinical Impression(s) / ED Diagnoses Final diagnoses:  COVID-19  Encephalopathy  Acute respiratory failure with hypoxia [facility])    Rx / DC Orders ED Discharge Orders     None        Truddie Hidden, MD 02/19/21 2009

## 2021-02-19 NOTE — ED Notes (Signed)
Covid + verified from Lab

## 2021-02-19 NOTE — ED Triage Notes (Signed)
Per son pt has had increasing confusion over the last few days and it is normally a UTI

## 2021-02-20 ENCOUNTER — Emergency Department (HOSPITAL_BASED_OUTPATIENT_CLINIC_OR_DEPARTMENT_OTHER): Payer: Medicare Other

## 2021-02-20 ENCOUNTER — Encounter (HOSPITAL_COMMUNITY): Payer: Self-pay | Admitting: Internal Medicine

## 2021-02-20 DIAGNOSIS — I2510 Atherosclerotic heart disease of native coronary artery without angina pectoris: Secondary | ICD-10-CM

## 2021-02-20 DIAGNOSIS — L89151 Pressure ulcer of sacral region, stage 1: Secondary | ICD-10-CM | POA: Diagnosis present

## 2021-02-20 DIAGNOSIS — G894 Chronic pain syndrome: Secondary | ICD-10-CM | POA: Diagnosis present

## 2021-02-20 DIAGNOSIS — E1169 Type 2 diabetes mellitus with other specified complication: Secondary | ICD-10-CM | POA: Diagnosis present

## 2021-02-20 DIAGNOSIS — I5023 Acute on chronic systolic (congestive) heart failure: Secondary | ICD-10-CM

## 2021-02-20 DIAGNOSIS — U071 COVID-19: Secondary | ICD-10-CM | POA: Diagnosis present

## 2021-02-20 DIAGNOSIS — G9341 Metabolic encephalopathy: Secondary | ICD-10-CM | POA: Diagnosis present

## 2021-02-20 DIAGNOSIS — J9601 Acute respiratory failure with hypoxia: Secondary | ICD-10-CM | POA: Diagnosis present

## 2021-02-20 DIAGNOSIS — I130 Hypertensive heart and chronic kidney disease with heart failure and stage 1 through stage 4 chronic kidney disease, or unspecified chronic kidney disease: Secondary | ICD-10-CM | POA: Diagnosis present

## 2021-02-20 DIAGNOSIS — I4719 Other supraventricular tachycardia: Secondary | ICD-10-CM | POA: Diagnosis present

## 2021-02-20 DIAGNOSIS — E1122 Type 2 diabetes mellitus with diabetic chronic kidney disease: Secondary | ICD-10-CM

## 2021-02-20 DIAGNOSIS — R0602 Shortness of breath: Secondary | ICD-10-CM | POA: Diagnosis not present

## 2021-02-20 DIAGNOSIS — I447 Left bundle-branch block, unspecified: Secondary | ICD-10-CM | POA: Diagnosis present

## 2021-02-20 DIAGNOSIS — Z794 Long term (current) use of insulin: Secondary | ICD-10-CM

## 2021-02-20 DIAGNOSIS — I471 Supraventricular tachycardia: Secondary | ICD-10-CM

## 2021-02-20 DIAGNOSIS — J9602 Acute respiratory failure with hypercapnia: Secondary | ICD-10-CM | POA: Diagnosis present

## 2021-02-20 DIAGNOSIS — I10 Essential (primary) hypertension: Secondary | ICD-10-CM

## 2021-02-20 DIAGNOSIS — N1832 Chronic kidney disease, stage 3b: Secondary | ICD-10-CM | POA: Diagnosis present

## 2021-02-20 DIAGNOSIS — F411 Generalized anxiety disorder: Secondary | ICD-10-CM | POA: Diagnosis present

## 2021-02-20 DIAGNOSIS — L89892 Pressure ulcer of other site, stage 2: Secondary | ICD-10-CM | POA: Diagnosis present

## 2021-02-20 DIAGNOSIS — Z951 Presence of aortocoronary bypass graft: Secondary | ICD-10-CM | POA: Diagnosis not present

## 2021-02-20 DIAGNOSIS — N184 Chronic kidney disease, stage 4 (severe): Secondary | ICD-10-CM | POA: Diagnosis present

## 2021-02-20 DIAGNOSIS — R778 Other specified abnormalities of plasma proteins: Secondary | ICD-10-CM

## 2021-02-20 DIAGNOSIS — G40209 Localization-related (focal) (partial) symptomatic epilepsy and epileptic syndromes with complex partial seizures, not intractable, without status epilepticus: Secondary | ICD-10-CM | POA: Diagnosis present

## 2021-02-20 DIAGNOSIS — I350 Nonrheumatic aortic (valve) stenosis: Secondary | ICD-10-CM

## 2021-02-20 DIAGNOSIS — G40909 Epilepsy, unspecified, not intractable, without status epilepticus: Secondary | ICD-10-CM

## 2021-02-20 DIAGNOSIS — E782 Mixed hyperlipidemia: Secondary | ICD-10-CM

## 2021-02-20 DIAGNOSIS — F410 Panic disorder [episodic paroxysmal anxiety] without agoraphobia: Secondary | ICD-10-CM | POA: Diagnosis present

## 2021-02-20 DIAGNOSIS — I517 Cardiomegaly: Secondary | ICD-10-CM | POA: Diagnosis not present

## 2021-02-20 DIAGNOSIS — E785 Hyperlipidemia, unspecified: Secondary | ICD-10-CM | POA: Diagnosis present

## 2021-02-20 DIAGNOSIS — I248 Other forms of acute ischemic heart disease: Secondary | ICD-10-CM | POA: Diagnosis present

## 2021-02-20 DIAGNOSIS — K219 Gastro-esophageal reflux disease without esophagitis: Secondary | ICD-10-CM

## 2021-02-20 DIAGNOSIS — E1141 Type 2 diabetes mellitus with diabetic mononeuropathy: Secondary | ICD-10-CM | POA: Diagnosis present

## 2021-02-20 DIAGNOSIS — E1151 Type 2 diabetes mellitus with diabetic peripheral angiopathy without gangrene: Secondary | ICD-10-CM | POA: Diagnosis present

## 2021-02-20 LAB — CBC WITH DIFFERENTIAL/PLATELET
Abs Immature Granulocytes: 0.02 10*3/uL (ref 0.00–0.07)
Basophils Absolute: 0 10*3/uL (ref 0.0–0.1)
Basophils Relative: 0 %
Eosinophils Absolute: 0 10*3/uL (ref 0.0–0.5)
Eosinophils Relative: 0 %
HCT: 28.6 % — ABNORMAL LOW (ref 36.0–46.0)
Hemoglobin: 8.9 g/dL — ABNORMAL LOW (ref 12.0–15.0)
Immature Granulocytes: 1 %
Lymphocytes Relative: 8 %
Lymphs Abs: 0.3 10*3/uL — ABNORMAL LOW (ref 0.7–4.0)
MCH: 28.5 pg (ref 26.0–34.0)
MCHC: 31.1 g/dL (ref 30.0–36.0)
MCV: 91.7 fL (ref 80.0–100.0)
Monocytes Absolute: 0.2 10*3/uL (ref 0.1–1.0)
Monocytes Relative: 4 %
Neutro Abs: 3.6 10*3/uL (ref 1.7–7.7)
Neutrophils Relative %: 87 %
Platelets: 148 10*3/uL — ABNORMAL LOW (ref 150–400)
RBC: 3.12 MIL/uL — ABNORMAL LOW (ref 3.87–5.11)
RDW: 16.8 % — ABNORMAL HIGH (ref 11.5–15.5)
WBC: 4.1 10*3/uL (ref 4.0–10.5)
nRBC: 0 % (ref 0.0–0.2)

## 2021-02-20 LAB — COMPREHENSIVE METABOLIC PANEL
ALT: 31 U/L (ref 0–44)
AST: 33 U/L (ref 15–41)
Albumin: 3.3 g/dL — ABNORMAL LOW (ref 3.5–5.0)
Alkaline Phosphatase: 59 U/L (ref 38–126)
Anion gap: 15 (ref 5–15)
BUN: 56 mg/dL — ABNORMAL HIGH (ref 8–23)
CO2: 24 mmol/L (ref 22–32)
Calcium: 8.6 mg/dL — ABNORMAL LOW (ref 8.9–10.3)
Chloride: 101 mmol/L (ref 98–111)
Creatinine, Ser: 2.27 mg/dL — ABNORMAL HIGH (ref 0.44–1.00)
GFR, Estimated: 24 mL/min — ABNORMAL LOW (ref >60)
Glucose, Bld: 225 mg/dL — ABNORMAL HIGH (ref 70–99)
Potassium: 3.9 mmol/L (ref 3.5–5.1)
Sodium: 140 mmol/L (ref 135–145)
Total Bilirubin: 1.2 mg/dL (ref 0.3–1.2)
Total Protein: 7.1 g/dL (ref 6.5–8.1)

## 2021-02-20 LAB — I-STAT ARTERIAL BLOOD GAS, ED
Acid-Base Excess: 0 mmol/L (ref 0.0–2.0)
Bicarbonate: 28.3 mmol/L — ABNORMAL HIGH (ref 20.0–28.0)
Calcium, Ion: 1.09 mmol/L — ABNORMAL LOW (ref 1.15–1.40)
HCT: 31 % — ABNORMAL LOW (ref 36.0–46.0)
Hemoglobin: 10.5 g/dL — ABNORMAL LOW (ref 12.0–15.0)
O2 Saturation: 100 %
Patient temperature: 99.7
Potassium: 4.4 mmol/L (ref 3.5–5.1)
Sodium: 141 mmol/L (ref 135–145)
TCO2: 30 mmol/L (ref 22–32)
pCO2 arterial: 63 mmHg — ABNORMAL HIGH (ref 32.0–48.0)
pH, Arterial: 7.263 — ABNORMAL LOW (ref 7.350–7.450)
pO2, Arterial: 442 mmHg — ABNORMAL HIGH (ref 83.0–108.0)

## 2021-02-20 LAB — CBG MONITORING, ED
Glucose-Capillary: 207 mg/dL — ABNORMAL HIGH (ref 70–99)
Glucose-Capillary: 215 mg/dL — ABNORMAL HIGH (ref 70–99)
Glucose-Capillary: 263 mg/dL — ABNORMAL HIGH (ref 70–99)
Glucose-Capillary: 210 mg/dL — ABNORMAL HIGH (ref 70–99)

## 2021-02-20 LAB — PROCALCITONIN: Procalcitonin: 0.42 ng/mL

## 2021-02-20 LAB — HEMOGLOBIN A1C
Hgb A1c MFr Bld: 7.7 % — ABNORMAL HIGH (ref 4.8–5.6)
Mean Plasma Glucose: 174.29 mg/dL

## 2021-02-20 LAB — BLOOD GAS, VENOUS
Acid-Base Excess: 0.1 mmol/L (ref 0.0–2.0)
Bicarbonate: 24.9 mmol/L (ref 20.0–28.0)
FIO2: 21
O2 Saturation: 85.9 %
Patient temperature: 37
pCO2, Ven: 45.2 mmHg (ref 44.0–60.0)
pH, Ven: 7.359 (ref 7.250–7.430)
pO2, Ven: 56.4 mmHg — ABNORMAL HIGH (ref 32.0–45.0)

## 2021-02-20 LAB — TROPONIN I (HIGH SENSITIVITY)
Troponin I (High Sensitivity): 811 ng/L (ref <18)
Troponin I (High Sensitivity): 492 ng/L (ref <18)

## 2021-02-20 LAB — BRAIN NATRIURETIC PEPTIDE: B Natriuretic Peptide: 1478.8 pg/mL — ABNORMAL HIGH (ref 0.0–100.0)

## 2021-02-20 LAB — D-DIMER, QUANTITATIVE: D-Dimer, Quant: 2.13 ug/mL-FEU — ABNORMAL HIGH (ref 0.00–0.50)

## 2021-02-20 LAB — MAGNESIUM: Magnesium: 2.2 mg/dL (ref 1.7–2.4)

## 2021-02-20 LAB — C-REACTIVE PROTEIN: CRP: 3.5 mg/dL — ABNORMAL HIGH (ref <1.0)

## 2021-02-20 LAB — HIV ANTIBODY (ROUTINE TESTING W REFLEX): HIV Screen 4th Generation wRfx: NONREACTIVE

## 2021-02-20 MED ORDER — ALBUTEROL SULFATE HFA 108 (90 BASE) MCG/ACT IN AERS
2.0000 | INHALATION_SPRAY | Freq: Four times a day (QID) | RESPIRATORY_TRACT | Status: DC
  Administered 2021-02-20 – 2021-02-22 (×7): 2 via RESPIRATORY_TRACT
  Filled 2021-02-20: qty 6.7

## 2021-02-20 MED ORDER — MOLNUPIRAVIR EUA 200MG CAPSULE
4.0000 | ORAL_CAPSULE | Freq: Two times a day (BID) | ORAL | Status: DC
  Administered 2021-02-20 – 2021-02-22 (×5): 800 mg via ORAL
  Filled 2021-02-20 (×2): qty 4

## 2021-02-20 MED ORDER — ALPRAZOLAM 0.25 MG PO TABS
0.2500 mg | ORAL_TABLET | Freq: Once | ORAL | Status: AC
Start: 2021-02-21 — End: 2021-02-21
  Administered 2021-02-21: 0.25 mg via ORAL
  Filled 2021-02-20: qty 1

## 2021-02-20 MED ORDER — LEVETIRACETAM ER 500 MG PO TB24: 1500.0000 mg | ORAL_TABLET | Freq: Every day | ORAL | Status: DC

## 2021-02-20 MED ORDER — ENOXAPARIN SODIUM 40 MG/0.4ML IJ SOSY: 40.0000 mg | PREFILLED_SYRINGE | Freq: Every day | INTRAMUSCULAR | Status: DC

## 2021-02-20 MED ORDER — POLYETHYLENE GLYCOL 3350 17 G PO PACK: 17.0000 g | PACK | Freq: Every day | ORAL | Status: DC | PRN

## 2021-02-20 MED ORDER — ROSUVASTATIN CALCIUM 5 MG PO TABS
10.0000 mg | ORAL_TABLET | Freq: Every day | ORAL | Status: DC
  Administered 2021-02-20 – 2021-02-22 (×3): 10 mg via ORAL
  Filled 2021-02-20 (×3): qty 2

## 2021-02-20 MED ORDER — NITROGLYCERIN 0.4 MG SL SUBL: 0.4000 mg | SUBLINGUAL_TABLET | SUBLINGUAL | Status: DC | PRN

## 2021-02-20 MED ORDER — RANOLAZINE ER 500 MG PO TB12
1000.0000 mg | ORAL_TABLET | Freq: Two times a day (BID) | ORAL | Status: DC
  Filled 2021-02-20: qty 2

## 2021-02-20 MED ORDER — SERTRALINE HCL 100 MG PO TABS
100.0000 mg | ORAL_TABLET | Freq: Two times a day (BID) | ORAL | Status: DC
  Administered 2021-02-20 – 2021-02-22 (×5): 100 mg via ORAL
  Filled 2021-02-20 (×5): qty 1

## 2021-02-20 MED ORDER — FUROSEMIDE 10 MG/ML IJ SOLN
40.0000 mg | Freq: Once | INTRAMUSCULAR | Status: AC
  Administered 2021-02-20: 40 mg via INTRAVENOUS
  Filled 2021-02-20: qty 4

## 2021-02-20 MED ORDER — ACETAMINOPHEN 650 MG RE SUPP: 650.0000 mg | Freq: Four times a day (QID) | RECTAL | Status: DC | PRN

## 2021-02-20 MED ORDER — RANOLAZINE ER 500 MG PO TB12
500.0000 mg | ORAL_TABLET | Freq: Two times a day (BID) | ORAL | Status: DC
  Administered 2021-02-20 – 2021-02-22 (×5): 500 mg via ORAL
  Filled 2021-02-20 (×7): qty 1

## 2021-02-20 MED ORDER — TORSEMIDE 20 MG PO TABS: 20.0000 mg | ORAL_TABLET | Freq: Every day | ORAL | Status: DC

## 2021-02-20 MED ORDER — LEVETIRACETAM ER 500 MG PO TB24
1000.0000 mg | ORAL_TABLET | Freq: Every day | ORAL | Status: DC
  Administered 2021-02-21 (×2): 1000 mg via ORAL
  Filled 2021-02-20 (×4): qty 2

## 2021-02-20 MED ORDER — ACETAMINOPHEN 325 MG PO TABS: 650.0000 mg | ORAL_TABLET | Freq: Four times a day (QID) | ORAL | Status: DC | PRN

## 2021-02-20 MED ORDER — CARVEDILOL 6.25 MG PO TABS
6.2500 mg | ORAL_TABLET | Freq: Two times a day (BID) | ORAL | Status: DC
  Administered 2021-02-20 – 2021-02-22 (×4): 6.25 mg via ORAL
  Filled 2021-02-20 (×3): qty 1
  Filled 2021-02-20: qty 2

## 2021-02-20 MED ORDER — PREDNISONE 50 MG PO TABS
50.0000 mg | ORAL_TABLET | Freq: Every day | ORAL | Status: DC
  Administered 2021-02-20 – 2021-02-22 (×3): 50 mg via ORAL
  Filled 2021-02-20: qty 1
  Filled 2021-02-20: qty 3
  Filled 2021-02-20: qty 1

## 2021-02-20 MED ORDER — ROSUVASTATIN CALCIUM 20 MG PO TABS: 40.0000 mg | ORAL_TABLET | Freq: Every day | ORAL | Status: DC

## 2021-02-20 MED ORDER — METHYLPREDNISOLONE SODIUM SUCC 125 MG IJ SOLR: 80.0000 mg | Freq: Two times a day (BID) | INTRAMUSCULAR | Status: DC

## 2021-02-20 MED ORDER — HYDRALAZINE HCL 20 MG/ML IJ SOLN: 10.0000 mg | Freq: Four times a day (QID) | INTRAMUSCULAR | Status: DC | PRN

## 2021-02-20 MED ORDER — INSULIN GLARGINE-YFGN 100 UNIT/ML ~~LOC~~ SOLN
35.0000 [IU] | Freq: Every day | SUBCUTANEOUS | Status: DC
  Administered 2021-02-20: 35 [IU] via SUBCUTANEOUS
  Filled 2021-02-20 (×2): qty 0.35

## 2021-02-20 MED ORDER — ONDANSETRON HCL 4 MG/2ML IJ SOLN: 4.0000 mg | Freq: Four times a day (QID) | INTRAMUSCULAR | Status: DC | PRN

## 2021-02-20 MED ORDER — ZINC SULFATE 220 (50 ZN) MG PO CAPS
220.0000 mg | ORAL_CAPSULE | Freq: Every day | ORAL | Status: DC
  Administered 2021-02-20 – 2021-02-22 (×3): 220 mg via ORAL
  Filled 2021-02-20 (×3): qty 1

## 2021-02-20 MED ORDER — ENOXAPARIN SODIUM 30 MG/0.3ML IJ SOSY
30.0000 mg | PREFILLED_SYRINGE | Freq: Every day | INTRAMUSCULAR | Status: DC
  Administered 2021-02-20 – 2021-02-22 (×3): 30 mg via SUBCUTANEOUS
  Filled 2021-02-20 (×3): qty 0.3

## 2021-02-20 MED ORDER — TORSEMIDE 20 MG PO TABS
20.0000 mg | ORAL_TABLET | Freq: Every day | ORAL | Status: DC
  Administered 2021-02-21 – 2021-02-22 (×2): 20 mg via ORAL
  Filled 2021-02-20 (×2): qty 1

## 2021-02-20 MED ORDER — ALBUTEROL SULFATE HFA 108 (90 BASE) MCG/ACT IN AERS
2.0000 | INHALATION_SPRAY | RESPIRATORY_TRACT | Status: DC | PRN
  Filled 2021-02-20: qty 6.7

## 2021-02-20 MED ORDER — CLOPIDOGREL BISULFATE 75 MG PO TABS
75.0000 mg | ORAL_TABLET | Freq: Every day | ORAL | Status: DC
  Administered 2021-02-20 – 2021-02-22 (×3): 75 mg via ORAL
  Filled 2021-02-20 (×3): qty 1

## 2021-02-20 MED ORDER — SODIUM CHLORIDE 0.9% FLUSH
3.0000 mL | Freq: Two times a day (BID) | INTRAVENOUS | Status: DC
  Administered 2021-02-20 – 2021-02-22 (×5): 3 mL via INTRAVENOUS

## 2021-02-20 MED ORDER — GUAIFENESIN-DM 100-10 MG/5ML PO SYRP: 10.0000 mL | ORAL_SOLUTION | ORAL | Status: DC | PRN

## 2021-02-20 MED ORDER — ONDANSETRON HCL 4 MG PO TABS: 4.0000 mg | ORAL_TABLET | Freq: Four times a day (QID) | ORAL | Status: DC | PRN

## 2021-02-20 MED ORDER — ASCORBIC ACID 500 MG PO TABS
500.0000 mg | ORAL_TABLET | Freq: Every day | ORAL | Status: DC
  Administered 2021-02-20 – 2021-02-22 (×3): 500 mg via ORAL
  Filled 2021-02-20 (×3): qty 1

## 2021-02-20 MED ORDER — INSULIN ASPART 100 UNIT/ML IJ SOLN
0.0000 [IU] | Freq: Three times a day (TID) | INTRAMUSCULAR | Status: DC
Start: 2021-02-20 — End: 2021-02-22
  Administered 2021-02-20: 8 [IU] via SUBCUTANEOUS
  Administered 2021-02-20: 5 [IU] via SUBCUTANEOUS
  Administered 2021-02-21: 11 [IU] via SUBCUTANEOUS
  Administered 2021-02-21: 5 [IU] via SUBCUTANEOUS
  Administered 2021-02-21: 11 [IU] via SUBCUTANEOUS
  Administered 2021-02-21: 8 [IU] via SUBCUTANEOUS
  Administered 2021-02-21: 5 [IU] via SUBCUTANEOUS
  Administered 2021-02-22: 3 [IU] via SUBCUTANEOUS
  Administered 2021-02-22: 5 [IU] via SUBCUTANEOUS

## 2021-02-20 MED ORDER — ASPIRIN EC 81 MG PO TBEC
81.0000 mg | DELAYED_RELEASE_TABLET | Freq: Every day | ORAL | Status: DC
  Administered 2021-02-20 – 2021-02-22 (×3): 81 mg via ORAL
  Filled 2021-02-20 (×3): qty 1

## 2021-02-20 NOTE — Assessment & Plan Note (Addendum)
   Episodic atrial tachycardia at [facility] [place] secondary to underlying respiratory distress  Patient seems tenuously converted as respiratory status improved.  Patient now normal sinus rhythm  We will obtain serial cardiac enzymes and serial EKGs.  While I expect this to be slightly elevated due to demand ischemia will ensure it is not markedly elevated to rule out plaque rupture.  Continue to monitor on telemetry  Monitoring electrolytes and correcting as necessary  Continue home regimen of beta-blocker therapy.

## 2021-02-20 NOTE — ED Notes (Signed)
Transfer from [facility] via [facility] for admission. Pt Covid positive, 100% NRB.

## 2021-02-20 NOTE — Assessment & Plan Note (Addendum)
   Patient presenting with several days of increasing lethargy confusion and poor oral intake  This was likely secondary to progressive COVID-19 infection  Treating underlying infection with intravenous Molnupirivir and steroids  Symptoms of encephalopathy should to be self-limiting -husband this morning states that her mentation is already slowly improving  If confusion does not resolve we will expand work-up of encephalopathy

## 2021-02-20 NOTE — Assessment & Plan Note (Signed)
   Continue home regimen of Keppra

## 2021-02-20 NOTE — ED Notes (Signed)
Pt called out d/t feeling hot and unable to catch her breath. Pt become agitated, tripoding in bed, diaphoretic and lethargic. Sats dropping to 90% on 2L [place]. Increased O2 to 4L, no change. Pt placed on NRB 15L  RT paged to room Dr. Sedonia Small made aware and at bedside, new orders placed and followed through.  Claiborne Billings charge RN made aware    Spouse at bedside. Sons came back to ED. Pt being moved to room 13 to be closer to nurses station

## 2021-02-20 NOTE — Assessment & Plan Note (Signed)
.   Resume patients home regimen of oral antihypertensives . Titrate antihypertensive regimen as necessary to achieve adequate BP control . PRN intravenous antihypertensives for excessively elevated blood pressure   

## 2021-02-20 NOTE — ED Provider Notes (Signed)
  Provider Note MRN:  [id number]  Arrival date & time: 02/20/21    ED Course and Medical Decision Making  Assumed care from Dr. Karle Starch at shift change.  Covid positive initially on 2L [place] admitted to hospitalist.  1230am update:  Patient with acute worsening of condition, sitting upright in respiratory distress requiring NRB.  HR 130, RR 30.  Diffusely rhonchorous, h/o chf received only 250-500cc fluids but suspect flash edema vs worsening covid.  Given 40mg  lasix with good UOP thus far.  Started on bipap.  Discussing transfer to [facility] ED.  .Critical Care Performed by: Maudie Flakes, MD Authorized by: Maudie Flakes, MD   Critical care provider statement:    Critical care time (minutes):  80   Critical care was necessary to treat or prevent imminent or life-threatening deterioration of the following conditions:  Respiratory failure   Critical care was time spent personally by me on the following activities:  Development of treatment plan with patient or surrogate, discussions with consultants, evaluation of patient's response to treatment, examination of patient, ordering and review of laboratory studies, ordering and review of radiographic studies, ordering and performing treatments and interventions, pulse oximetry, re-evaluation of patient's condition and review of old charts   I assumed direction of critical care for this patient from another provider in my specialty: yes    Final Clinical Impressions(s) / ED Diagnoses     ICD-10-CM   1. COVID-19  U07.1     2. Encephalopathy  G93.40     3. Acute respiratory failure with hypoxia [facility])  J96.01       ED Discharge Orders     None       Discharge Instructions   None     Barth Kirks. Sedonia Small, [place] [email or web address]    Maudie Flakes, MD 02/20/21 [phone]

## 2021-02-20 NOTE — Assessment & Plan Note (Addendum)
.   Patient is currently chest pain free . Cycling cardiac enzymes and EKGs anyway considering patient's episode of atrial tachycardia to ensure that there is no suggestion of plaque rupture albeit unlikely.  Expect for cardiac enzymes to be somewhat elevated due to demand ischemia due to respiratory compromise. . Monitoring patient on telemetry . Continue home regimen of antiplatelet therapy, lipid lowering therapy and AV nodal blocking therapy

## 2021-02-20 NOTE — Progress Notes (Signed)
Subjective: Patient admitted this morning, see detailed H&P by Dr Cyd Silence 65 year old female with history of diabetes mellitus type 2, CKD stage IIIb, hypertension, hyperlipidemia, CHF with EF of 60 to 65%, moderate to severe aortic stenosis, moderate mitral valve stenosis, peripheral vascular disease s/p right carotid endarterectomy, CAD s/p CABG, s/p bilateral BKA, seizure disorder mood came to [facility] as transfer from [facility] for COVID-19 infection and hypoxemia.  In the ED patient was given 125 mg Solu-Medrol as well as remdesivir.  Patient was placed on BiPAP due to increased work of breathing, initially received 500 cc of IV fluid in the ED.  She also received 40 mg of Lasix as there was concern for pulmonary edema.  Patient's troponin went up to 811, BNP 1478  Vitals:   02/20/21 1610 02/20/21 1709  BP: 130/72   Pulse: 100   Resp: 16   Temp:  98.5 F (36.9 C)  SpO2: 97%       A/P Acute hypoxemic respiratory failure Elevated troponin COVID-19 infection  We will consult cardiology for elevated troponin    Oswald Hillock [facility] [phone]

## 2021-02-20 NOTE — Assessment & Plan Note (Signed)
Strict intake and output monitoring Creatinine near baseline Minimizing nephrotoxic agents as much as possible Serial chemistries to monitor renal function and electrolytes  

## 2021-02-20 NOTE — ED Notes (Signed)
Pt called out needing to use the bathroom. I placed the pt on the bedpan and removed her purewick while on the bedpan. Pt rang call light when she was finished. Pt had urinated a large amount and had a large BM. Provided peri care to the pt and changed the brief and chux. I also placed a new purewick back onto the pt. Pt requested a drink once she was cleaned up, gave the pt and her husband a drink. Pt was pulled up in the bed, she is clean and dry and resting comfortably.

## 2021-02-20 NOTE — Assessment & Plan Note (Addendum)
   Patient found to be COVID PCR positive on arrival and received 1 dose of remdesivir at [place] initially did request Paxlovid instead of remdesivir however due to patient's borderline renal function (GFR currently 28) we are unable to order this.  We will then instead place patient on Molnupirivir 800mg  BID  If patient clinically worsens will consider addition of Barcitinib or Actemra   Remainder of assessment and plan as above

## 2021-02-20 NOTE — Assessment & Plan Note (Signed)
Continuing home regimen of daily PPI therapy.  

## 2021-02-20 NOTE — Consult Note (Addendum)
Cardiology Consultation:   Patient ID: [name] MRN: [id number]; DOB: Feb 18, 1956  Admit date: 02/19/2021 Date of Consult: 02/20/2021  PCP:  Tonya Cruel, MD   [facility] Providers Cardiologist:  Tonya Breeding, MD   [facility]) Dr. Denman Foster  Patient Profile:   [name] is a 65 y.o. female with a hx of CABG x4 '08, diabetes, bilateral BKAs, carotid stenosis status post CEA '07, hypertension, hyperlipidemia, seizures and CKD who is being seen 02/20/2021 for the evaluation of elevated troponin at the request of Dr. Darrick Foster.  History of Present Illness:   [name] is a 65 year old female with past medical history noted above.  He has been followed by Dr. Percival Foster as an outpatient.  Prior cardiology care has been through [facility].  Has remote history of CAD with four-vessel CABG in 2008.  She was recently seen in the office on 01/31/2021 after an admission through [facility].  She was admitted there with acute on chronic CHF.   Prior echo 11/2020 with an EF of 30 to 35% with akinesis of the mid anterior, mid anterior septal, basal anterior/septal segments.  Moderate thickening of the aortic valve with severe aortic stenosis, mild MR.  At last office visit there was consideration of sending her for cardiac catheterization but creatinine was elevated, 2.83.  Her Demadex was reduced and an outpatient echocardiogram ordered.  It was felt that she would eventually need to undergo right and left cardiac catheterization to evaluate for possible TAVR in the future.  She was also referred to nephrology.  Echocardiogram 01/30/2021 noted LVEF of 60 to 65%, no regional wall motion abnormality, normal RV, mildly dilated left atrium, mild to moderate mitral regurgitation, moderate mitral stenosis with severe aortic stenosis mean gradient of 25 mmHg.   She presented to [facility] with complaints of altered mental status as well as shortness of breath.  Of note patient is a poor  historian.  Family indicated that for the past several days she had become increasingly lethargic and confused.  Also with poor oral intake and intermittent confusion.  Also noted a strong smell of urine and expressed concern for possible UTI.  Denied any recent fevers, nausea, vomiting or significant shortness of breath.  In the ED her labs showed sodium 140, potassium 4, creatinine 1.98, lactic acid 1.1, WBC 6.2, hemoglobin 8.8, BNP 1478, high-sensitivity troponin 811.  COVID-positive.  Initial chest x-ray negative.  Initial plan was to transfer to [facility] under the care of internal medicine.  Notes indicate while she was awaiting a bed she exhibited a rapid decline in her respiratory status with episodes of increasing respiratory distress and hypoxia.  She was transitioned to a nonrebreather and eventually placed on BiPAP.  She had received IV fluids.  Repeat chest x-ray 12/7 showed mild cardiomegaly with worsening bibasilar opacities concerning for pulmonary edema.  She was given IV Lasix.   Past Medical History:  Diagnosis Date   Acute congestive heart failure 03/29/2018   Acute encephalopathy 09/02/2013   Anemia, normocytic normochromic 03/14/2008   Arthritis    "hands"   Atherosclerosis of native arteries of the extremities with ulceration 07/14/2013   Atherosclerosis of native artery of extremity with intermittent claudication 09/16/2011   CAD (coronary artery disease) of artery bypass graft 03/14/2008   Chronic kidney disease (CKD) 12/26/2015   stage 3 due to type 2 diabetes mellitus   Chronic pain syndrome 39/05/90   Complication of anesthesia    pt felt like she had  a hard time waking up after surgery in Feb. 2015.   Coronary artery disease involving native coronary artery without angina pectoris 09/25/2015   Essential hypertension 03/14/2008   Gastroesophageal reflux disease with esophagitis 07/09/2016   Generalized anxiety disorder with panic attacks    History of one panic  attack   GERD (gastroesophageal reflux disease)    takes Zantac   Hyperlipidemia, unspecified 03/14/2008   Localization-related symptomatic epilepsy and epileptic syndromes with complex partial seizures, not intractable, without status epilepticus 06/27/2014   Major depressive disorder 03/14/2008   Neuropathy    Osteomyelitis of ankle or foot, left, acute 06/17/2013   Peripheral vascular disease    PVD (peripheral vascular disease) 03/25/2012   Type 2 diabetes mellitus with diabetic mononeuropathy, with long-term current use of insulin 03/14/2008    Past Surgical History:  Procedure Laterality Date   AMPUTATION Left 08/22/2013   Procedure: LEFT BELOW KNEE AMPUTATION;  Surgeon: Newt Minion, MD;  Location: [place];  Service: Orthopedics;  Laterality: Left;   ANKLE FUSION Left 05/11/2013   TIBIOCALCANEAL FUSION    ANKLE FUSION Left 05/12/2013   Procedure: LEFT TIBIOCALCANEAL FUSION;  Surgeon: Newt Minion, MD;  Location: [place];  Service: Orthopedics;  Laterality: Left;  Left Tibiocalcaneal Fusion   APPLICATION OF WOUND VAC  06/17/2013   Procedure: APPLICATION OF WOUND VAC;  Surgeon: Newt Minion, MD;  Location: [place];  Service: Orthopedics;;   BELOW KNEE LEG AMPUTATION Right Jan. 2008   CAROTID ENDARTERECTOMY Right    CATARACT EXTRACTION Left 12/25/14   CHOLECYSTECTOMY     COLONOSCOPY     CORONARY ARTERY BYPASS GRAFT  12/01/2007   "CABG X4" (05/12/2013)   DILATION AND CURETTAGE OF UTERUS     I & D EXTREMITY Left 06/17/2013   Procedure: IRRIGATION AND DEBRIDEMENT EXTREMITY, PLACEMENT ANTIBIOTIC STIMULAN BEADS;  Surgeon: Newt Minion, MD;  Location: [place];  Service: Orthopedics;  Laterality: Left;   INTRAMEDULLARY (IM) NAIL INTERTROCHANTERIC Left 08/20/2018   Procedure: INTRAMEDULLARY (IM) NAIL INTERTROCHANTRIC;  Surgeon: Shona Needles, MD;  Location: [place];  Service: Orthopedics;  Laterality: Left;   IRRIGATION AND DEBRIDEMENT ABSCESS Left 06/17/2013   ANKLE           DR [name]   TOE  AMPUTATION Right    "took off a couple toes before the amputation"   TONSILLECTOMY       Home Medications:  Prior to Admission medications   Medication Sig Start Date End Date Taking? Authorizing Provider  acetaminophen-codeine (TYLENOL #3) 300-30 MG tablet Take 1 tablet by mouth every 8 (eight) hours as needed for moderate pain. 09/06/20  Yes [provider]  albuterol (VENTOLIN HFA) 108 (90 Base) MCG/ACT inhaler Inhale 1-2 puffs into the lungs every 6 (six) hours as needed for wheezing or shortness of breath.  05/13/17  Yes [provider]  ALPRAZolam Duanne Moron) 0.5 MG tablet Take 0.5 mg by mouth 2 (two) times daily as needed for anxiety. 09/06/19  Yes [provider]  aspirin 81 MG tablet Take 1 tablet (81 mg total) by mouth daily. 05/13/17  Yes Libby Maw, MD  carvedilol (COREG) 6.25 MG tablet Take 1 tablet (6.25 mg total) by mouth 2 (two) times daily. 02/06/21  Yes Tonya Breeding, MD  clopidogrel (PLAVIX) 75 MG tablet Take 1 tablet (75 mg total) by mouth daily. 05/13/17  Yes Libby Maw, MD  cycloSPORINE (RESTASIS) 0.05 % ophthalmic emulsion Place 1 drop into both eyes 2 (two) times  daily as needed (dry eyes).   Yes [provider]  empagliflozin (JARDIANCE) 10 MG TABS tablet Take 10 mg by mouth daily.   Yes [provider]  ezetimibe (ZETIA) 10 MG tablet Take 10 mg by mouth daily.   Yes [provider]  insulin lispro (HUMALOG) 100 UNIT/ML injection Inject 12 Units into the skin 2 (two) times daily with a meal. 11/21/20  Yes [provider]  LANTUS SOLOSTAR 100 UNIT/ML Solostar Pen Inject 38 Units into the skin at bedtime. 07/13/18  Yes [provider]  Levetiracetam 750 MG TB24 Take 1,500 mg by mouth at bedtime. 09/24/20  Yes [provider]  lidocaine (LIDODERM) 5 % Place 1 patch onto the skin daily as needed (pain). 07/16/18  Yes [provider]  losartan (COZAAR) 50 MG tablet Take 50 mg  by mouth daily.   Yes [provider]  Multiple Vitamins-Minerals (MULTIVITAMINS THER. W/MINERALS) TABS Take 1 tablet by mouth daily.     Yes [provider]  nitroGLYCERIN (NITROSTAT) 0.4 MG SL tablet Place 0.4 mg under the tongue every 5 (five) minutes as needed for chest pain.   Yes [provider]  potassium chloride SA (KLOR-CON) 20 MEQ tablet Take 20 mEq by mouth 2 (two) times daily.   Yes [provider]  pregabalin (LYRICA) 50 MG capsule Take 50 mg by mouth daily as needed (severe pain).   Yes [provider]  ranolazine (RANEXA) 500 MG 12 hr tablet Take 1,000 mg by mouth 2 (two) times daily as needed (chest pain).   Yes [provider]  rosuvastatin (CRESTOR) 40 MG tablet Take 40 mg by mouth daily.   Yes [provider]  sertraline (ZOLOFT) 100 MG tablet Take 1 tablet (100 mg total) by mouth 2 (two) times daily. 05/13/17  Yes Libby Maw, MD  torsemide (DEMADEX) 20 MG tablet Take 60 mg by mouth daily as needed (fluid/swelling).   Yes [provider]    Inpatient Medications: Scheduled Meds:  albuterol  2 puff Inhalation Q6H   vitamin C  500 mg Oral Daily   aspirin EC  81 mg Oral Daily   carvedilol  6.25 mg Oral BID WC   clopidogrel  75 mg Oral Daily   enoxaparin (LOVENOX) injection  30 mg Subcutaneous Daily   insulin aspart  0-15 Units Subcutaneous TID AC & HS   insulin glargine-yfgn  35 Units Subcutaneous Daily   levETIRAcetam  1,000 mg Oral QHS   molnupiravir EUA  4 capsule Oral BID   predniSONE  50 mg Oral Q breakfast   ranolazine  500 mg Oral BID   rosuvastatin  10 mg Oral Daily   sertraline  100 mg Oral BID   sodium chloride flush  3 mL Intravenous Q12H   [START ON 02/21/2021] torsemide  20 mg Oral Daily   zinc sulfate  220 mg Oral Daily   Continuous Infusions:  PRN Meds: acetaminophen **OR** acetaminophen, albuterol, guaiFENesin-dextromethorphan, hydrALAZINE, nitroGLYCERIN, ondansetron  **OR** ondansetron (ZOFRAN) IV, polyethylene glycol  Allergies:    Allergies  Allergen Reactions   Metformin Hcl Nausea And Vomiting   Ace Inhibitors Cough    cough   Actos [Pioglitazone] Swelling   Atorvastatin Other (See Comments)    Other reaction(s): Other (See Comments) myalgias   Candesartan Cilexetil Other (See Comments)    Rash "cant recall"    Gabapentin Other (See Comments)    Dizziness and unbalanced   Isosorbide Nitrate Other (See Comments)  HA.   Quinapril Hcl Cough   Tramadol Hcl Other (See Comments)    seziure   Tape Rash    Redness, rash, itchiness Redness, rash, itchiness Redness, rash, itchiness No Plastic tape, paper tape OK Redness, rash, itchiness Redness, rash, itchiness No Plastic tape, paper tape OK    Social History:   Social History   Socioeconomic History   Marital status: Married    Spouse name: Not on file   Number of children: 2   Years of education: 14   Highest education level: Associate degree: occupational, Hotel manager, or vocational program  Occupational History   Occupation: Disability  Tobacco Use   Smoking status: Never   Smokeless tobacco: Never  Vaping Use   Vaping Use: Never used  Substance and Sexual Activity   Alcohol use: No   Drug use: No   Sexual activity: Not Currently  Other Topics Concern   Not on file  Social History Narrative   Right handed    Lives in a one story home   Drinks caffeine    Social Determinants of Health   Financial Resource Strain: Not on file  Food Insecurity: Not on file  Transportation Needs: Not on file  Physical Activity: Not on file  Stress: Not on file  Social Connections: Not on file  Intimate Partner Violence: Not on file    Family History:    Family History  Problem Relation Age of Onset   Cancer Mother    Heart disease Father    Diabetes Father    Hyperlipidemia Father    Hypertension Father    Alzheimer's disease Maternal Grandfather      ROS:  Please see the  history of present illness.   All other ROS reviewed and negative.     Physical Exam/Data:   Vitals:   02/20/21 1215 02/20/21 1300 02/20/21 1400 02/20/21 1500  BP: 122/62 131/66 110/79 140/64  Pulse: 85 84 98 95  Resp: 16 19 13 15   Temp:      TempSrc:      SpO2: 99% 98% 99% 96%  Weight:      Height:        Intake/Output Summary (Last 24 hours) at 02/20/2021 1536 Last data filed at 02/20/2021 0109 Gross per 24 hour  Intake 197.35 ml  Output 200 ml  Net -2.65 ml   Last 3 Weights 02/19/2021 01/31/2021 01/18/2021  Weight (lbs) 181 lb 3.2 oz 181 lb 3.2 oz 188 lb 9.6 oz  Weight (kg) 82.192 kg 82.192 kg 85.548 kg     Body mass index is 30.15 kg/m.   Exam per MD  EKG:  The EKG was personally reviewed and demonstrates:  SR with baseline LBBB 83 bpm  Relevant CV Studies:  Echo: 01/30/21  IMPRESSIONS     1. Left ventricular ejection fraction, by estimation, is 60 to 65%. The  left ventricle has normal function. The left ventricle has no regional  wall motion abnormalities. Left ventricular diastolic parameters are  indeterminate.   2. Right ventricular systolic function is normal. The right ventricular  size is normal.   3. Left atrial size was mildly dilated.   4. The mitral valve is degenerative. Mild to moderate mitral valve  regurgitation. Moderate mitral stenosis. Severe mitral annular  calcification.   5. The aortic valve is calcified. There is severe calcifcation of the  aortic valve. There is severe thickening of the aortic valve. Aortic valve  regurgitation is not visualized. Moderate to severe aortic valve  stenosis.   6. The inferior vena cava is normal in size with greater than 50%  respiratory variability, suggesting right atrial pressure of 3 mmHg.   Comparison(s): Outside echocardiogram September 2022 demonstrated an EF of  30 to 35%. There was thinning and akinesis of the mid anterior, mid  anteroseptal, basal anterior and basal anteroseptal segments.  There was  moderately thickened aortic valve with  severe aortic stenosis. There was mild mitral regurgitation.      FINDINGS   Left Ventricle: Left ventricular ejection fraction, by estimation, is 60  to 65%. The left ventricle has normal function. The left ventricle has no  regional wall motion abnormalities. The left ventricular internal cavity  size was normal in size. There is   no left ventricular hypertrophy. Left ventricular diastolic parameters  are indeterminate.   Right Ventricle: The right ventricular size is normal. No increase in  right ventricular wall thickness. Right ventricular systolic function is  normal.   Left Atrium: Left atrial size was mildly dilated.   Right Atrium: Right atrial size was normal in size.   Pericardium: There is no evidence of pericardial effusion.   Mitral Valve: The mitral valve is degenerative in appearance. There is  severe thickening of the mitral valve leaflet(s). There is severe  calcification of the mitral valve leaflet(s). Severely decreased mobility  of the mitral valve leaflets. Severe mitral   annular calcification. Mild to moderate mitral valve regurgitation.  Moderate mitral valve stenosis. MV peak gradient, 18.0 mmHg. The mean  mitral valve gradient is 10.5 mmHg.   Tricuspid Valve: The tricuspid valve is normal in structure. Tricuspid  valve regurgitation is mild . No evidence of tricuspid stenosis.   Aortic Valve: The aortic valve is calcified. There is severe calcifcation  of the aortic valve. There is severe thickening of the aortic valve. There  is severe aortic valve annular calcification. Aortic valve regurgitation  is not visualized. Moderate to  severe aortic stenosis is present. Aortic valve mean gradient measures  25.0 mmHg. Aortic valve peak gradient measures 43.8 mmHg. Aortic valve  area, by VTI measures 0.63 cm.   Pulmonic Valve: The pulmonic valve was normal in structure. Pulmonic valve  regurgitation is  not visualized. No evidence of pulmonic stenosis.   Aorta: The aortic root is normal in size and structure.   Venous: The inferior vena cava is normal in size with greater than 50%  respiratory variability, suggesting right atrial pressure of 3 mmHg.   IAS/Shunts: No atrial level shunt detected by color flow Doppler.   Laboratory Data:  High Sensitivity Troponin:   Recent Labs  Lab 02/20/21 1318  TROPONINIHS 811*     Chemistry Recent Labs  Lab 02/19/21 1752 02/20/21 0137 02/20/21 0740 02/20/21 1318  NA 140 141 140  --   K 4.0 4.4 3.9  --   CL 100  --  101  --   CO2 27  --  24  --   GLUCOSE 165*  --  225*  --   BUN 49*  --  56*  --   CREATININE 1.98*  --  2.27*  --   CALCIUM 9.3  --  8.6*  --   MG  --   --   --  2.2  GFRNONAA 28*  --  24*  --   ANIONGAP 13  --  15  --     Recent Labs  Lab 02/20/21 0740  PROT 7.1  ALBUMIN 3.3*  AST 33  ALT  31  ALKPHOS 59  BILITOT 1.2   Lipids No results for input(s): CHOL, TRIG, HDL, LABVLDL, LDLCALC, CHOLHDL in the last 168 hours.  Hematology Recent Labs  Lab 02/19/21 1752 02/20/21 0137 02/20/21 0740  WBC 6.2  --  4.1  RBC 3.14*  --  3.12*  HGB 8.8* 10.5* 8.9*  HCT 28.2* 31.0* 28.6*  MCV 89.8  --  91.7  MCH 28.0  --  28.5  MCHC 31.2  --  31.1  RDW 16.8*  --  16.8*  PLT 141*  --  148*   Thyroid No results for input(s): TSH, FREET4 in the last 168 hours.  BNP Recent Labs  Lab 02/20/21 1318  BNP 1,478.8*    DDimer  Recent Labs  Lab 02/20/21 0740  DDIMER 2.13*     Radiology/Studies:  CT Head Wo Contrast  Result Date: 02/19/2021 CLINICAL DATA:  Mental status change, increasing confusion, EXAM: CT HEAD WITHOUT CONTRAST TECHNIQUE: Contiguous axial images were obtained from the base of the skull through the vertex without intravenous contrast. COMPARISON:  09/02/2013 CT head FINDINGS: Brain: No evidence of acute infarction, hemorrhage, cerebral edema, mass, mass effect, or midline shift. No hydrocephalus or  extra-axial fluid collection. Periventricular white matter changes, likely the sequela of chronic small vessel ischemic disease. Vascular: No hyperdense vessel. Skull: Normal. Negative for fracture or focal lesion. Sinuses/Orbits: Mucous retention cyst in the right sphenoid sinus. Mild mucosal thickening in the ethmoid air cells. Status post bilateral lens replacements. Other: The mastoid air cells are well aerated. IMPRESSION: IMPRESSION No acute intracranial process. No etiology is seen for the patient's altered mental status. Electronically Signed   By: Merilyn Baba M.D.   On: 02/19/2021 18:59   DG Chest Port 1 View  Result Date: 02/20/2021 CLINICAL DATA:  Shortness of breath EXAM: PORTABLE CHEST 1 VIEW COMPARISON:  02/19/2021 FINDINGS: Mild cardiomegaly. Bibasilar opacities, slightly worsened. There are Kerley B lines along the periphery of the right lung. No sizable pleural effusion. IMPRESSION: Mild cardiomegaly with worsening bibasilar opacities, likely pulmonary edema. Electronically Signed   By: Ulyses Jarred M.D.   On: 02/20/2021 01:08   DG Chest Port 1 View  Result Date: 02/19/2021 CLINICAL DATA:  COVID hypoxia EXAM: PORTABLE CHEST 1 VIEW COMPARISON:  11/16/2020 FINDINGS: Post sternotomy changes. Cardiomegaly. No focal opacity, pleural effusion or pneumothorax. No overt pulmonary edema. IMPRESSION: No active disease.  Cardiomegaly Electronically Signed   By: Donavan Foil M.D.   On: 02/19/2021 20:06     Assessment and Plan:   [name] is a 65 y.o. female with a hx of CABG x4 '08, diabetes, bilateral BKAs, carotid stenosis status post CEA '07, hypertension, hyperlipidemia, seizures and CKD who is being seen 02/20/2021 for the evaluation of elevated troponin at the request of Dr. Darrick Foster.  Elevated Troponin with known hx of CAD s/p prior CABG: hsTn 811, EKG showed SR with known LBBB.  No reports of chest pain prior to admission. -- cycle troponin -- discuss with MD regarding treatment  with IV heparin -- continue ASA, BB  Acute respiratory failure with hypoxia 2/2 COVID-19/HFpEF: Suspect multifactorial.  On admission acute decline requiring nonrebreather and then BiPAP.  Since then has been weaned from BiPAP back to nasal cannula.  Initial chest x-ray negative for edema, but given IV fluids.  Repeat chest x-ray noted bibasilar opacities.  Improved with IV Lasix. --COVID treatment per primary --Suspect she should be able to transition to oral diuretics tomorrow pending Cr  Acute metabolic  encephalopathy: CT head negative for acute change. -- per primary  Moderate to Severe aortic stenosis: Echo 01/30/2021 with moderate to severe aortic stenosis. Mean gradient of 25 mmHg.  According to office notes there has been discussion regarding potential TAVR work-up as an outpatient. -- given her acute COVID infection, would not anticipate inpatient work up  Hypertension: stable -- continue coreg   CKD stage III: Cr 1.98>>2.27 --follow BMET  DM: SSI -- Hgb A1c 7.7  Risk Assessment/Risk Scores:   New York Heart Association (NYHA) Functional Class NYHA Class II   For questions or updates, please contact [facility] Please consult [email or web address] for contact info under    Signed, Reino Bellis, NP  02/20/2021 3:36 PM    Patient seen and examined. Agree with assessment and plan.  [name] is a 65 year old female who lives in [place].  She has history of known CAD and underwent CABG revascularization surgery x4 in 2008 by Dr. Darcey Nora, she has carotid disease status post carotid endarterectomy in 2007, history of hypertension, hyperlipidemia, diabetes mellitus with bilateral BKA ease, as well as chronic kidney disease and history of seizure disorder.  She had been remotely followed by Dr. Percival Foster and recently reestablished with him in November 2022 following an admission at [facility] in September.  In September 2022 she developed increasing shortness of  breath.  An echo Doppler study showed an EF of 30 to 35% with akinesis of the mid anterior mid anteroseptal basal anterior and septal segments.  There was thickening of her aortic valve and AV gradient was 25 mg and it was she may have a low gradient severe aortic stenosis apparently she was diuresed at that time.  She reestablish care with Dr. Percival Foster and saw him on January 31, 2021.  A follow-up echo Doppler study now showed normalization of LV function with EF of 60 to 65% without wall motion abnormality, and probable moderately severe aortic stenosis with a mean gradient of 25 mm gradient of 43.  She also had severe mitral annular calcification with mild to moderate mitral regurgitation and moderate mitral stenosis.  She has CKD and renal function has worsened with creatinine increasing to 2.27.  Over the past several days there was some decreased mental status and her son felt the patient may have had a urinary tract infection.  She presented to [facility] ER and was given fluids which resulted in acute volume overload for which she was transition to a nonrebreather oxygen supplementation and eventually placed on BiPAP.  A chest x-ray showed mild cardiomegaly with worsening bibasilar opacities suggestive of pulmonary edema for which she was treated with IV furosemide.  BNP is elevated at 1478 and troponin is elevated at 811 consistent with demand ischemia;  follow-up troponin levels will be obtained  for further evaluation.  She was admitted to the hospitalist service.  Of note she has tested positive for COVID.  She denies any fevers.  She was given a dose of Solu-Medrol as well as a dose of remdesivir with plans to switch her to a different agent.  She has been given diuretic intravenously presently, her telemetry shows sinus tachycardia at 107 bpm with bundle branch block.  Blood pressure is stable 135/65.  Neck veins are not significantly elevated.  Presently her lungs are relatively clear.  There is no  wheezing.  She is tachycardic with 1/6 to 2/6 systolic murmur.  Abdomen is soft nontender and protuberant.  She is status post bilateral BKA's.  Her  ECG showed sinus rhythm at 83 bpm with left bundle branch block.  At present, recommend an initial conservative approach.  We will cycle troponin levels.  She does not have any chest pain.  Given her acute COVID infection as well as CKD with creatinine increasing to 2.27 would stabilize and not plan for an invasive work-up at this time.  Her symptoms have improved with IV Lasix and she will undergo continued treatment for her COVID.  Mental status is now clear and CT of her head was negative for acute intracranial process.  Anticipate transition to oral diuretic tomorrow.  We will follow-up chemistry, BNP, TSH and fasting lipids in a.m.  Troy Sine, MD, [facility] 02/20/2021 5:39 PM

## 2021-02-20 NOTE — H&P (Signed)
History and Physical    [name] XBL:[id number] DOB: Mar 01, 1956 DOA: 02/19/2021  PCP: [name], MD  Patient coming from: [facility] via [facility]   Chief Complaint:  Chief Complaint  Patient presents with   Altered Mental Status     HPI:    65 year old female with past medical history of insulin-dependent diabetes mellitus type 2, CKD IIIb, hypertension, hyperlipidemia, congestive heart failure (Echo 01/2021 EF 60-65% up from 30-35% 11/2020), moderate to severe aortic stenosis (Echo 01/2021, aortic valve area  0.63 cm), moderate mitral valve stenosis, peripheral vascular disease status post right carotid endarterectomy, coronary artery disease status post CABG, status post bilateral BKA and seizure disorder who presents to [facility] emergency department as a transfer from [facility] for COVID-19 infection and hypoxia.    Patient is currently a poor historian due to altered mental status.  History is been obtained from the son.  Husband describes that for the past several days his wife has been becoming increasingly lethargic and confused.  Has been associated with poor oral intake and intermittent disorientation he also noted that the patient has had a strong smell to her urine and was concerned she had a urinary tract infection.  As the patient's confusion worsened patient additionally was exhibiting generalized weakness.  Husband denies any recent fevers, nausea, vomiting, complaints of shortness of breath.  Patient symptoms continue to worsen and the son eventually brought the [facility] for evaluation.  Upon evaluation in the emergency department patient was found to be hypoxic and was initiated on supplemental oxygen therapy via nasal cannula at 2 L.  COVID-19 PCR testing was found to be positive.  Patient was given 125 mg of Solu-Medrol as well as remdesivir.  Hospitalist group was then called and the patient was accepted for [facility].  While patient was awaiting bed patient exhibited a rapid decline in respiratory status with bouts of respiratory distress and hypoxia.  Patient was transitioned to nonrebreather mask before eventually being placed on BiPAP therapy for due to work of breathing.  Patient received nearly 500 cc of IV fluids at this point and ER provider was concerned for acute cardiogenic pulmonary edema or worsening COVID.  40 mg of intravenous Lasix was administered and patient was then transferred from ER to ER to [facility] emergency department for expedited care.    Review of Systems:   Review of Systems  Unable to perform ROS: Mental status change   Past Medical History:  Diagnosis Date   Acute congestive heart failure 03/29/2018   Acute encephalopathy 09/02/2013   Anemia, normocytic normochromic 03/14/2008   Arthritis    "hands"   Atherosclerosis of native arteries of the extremities with ulceration 07/14/2013   Atherosclerosis of native artery of extremity with intermittent claudication 09/16/2011   CAD (coronary artery disease) of artery bypass graft 03/14/2008   Chronic kidney disease (CKD) 12/26/2015   stage 3 due to type 2 diabetes mellitus   Chronic pain syndrome 30/09/6224   Complication of anesthesia    pt felt like she had a hard time waking up after surgery in Feb. 2015.   Coronary artery disease involving native coronary artery without angina pectoris 09/25/2015   Essential hypertension 03/14/2008   Gastroesophageal reflux disease with esophagitis 07/09/2016   Generalized anxiety disorder with panic attacks    History of one panic attack   GERD (gastroesophageal reflux disease)    takes Zantac   Hyperlipidemia, unspecified  03/14/2008   Localization-related symptomatic epilepsy and epileptic syndromes with complex partial seizures, not intractable, without status epilepticus 06/27/2014   Major depressive disorder 03/14/2008   Neuropathy    Osteomyelitis of ankle or foot,  left, acute 06/17/2013   Peripheral vascular disease    PVD (peripheral vascular disease) 03/25/2012   Type 2 diabetes mellitus with diabetic mononeuropathy, with long-term current use of insulin 03/14/2008    Past Surgical History:  Procedure Laterality Date   AMPUTATION Left 08/22/2013   Procedure: LEFT BELOW KNEE AMPUTATION;  Surgeon: Newt Minion, MD;  Location: [place];  Service: Orthopedics;  Laterality: Left;   ANKLE FUSION Left 05/11/2013   TIBIOCALCANEAL FUSION    ANKLE FUSION Left 05/12/2013   Procedure: LEFT TIBIOCALCANEAL FUSION;  Surgeon: Newt Minion, MD;  Location: [place];  Service: Orthopedics;  Laterality: Left;  Left Tibiocalcaneal Fusion   APPLICATION OF WOUND VAC  06/17/2013   Procedure: APPLICATION OF WOUND VAC;  Surgeon: Newt Minion, MD;  Location: [place];  Service: Orthopedics;;   BELOW KNEE LEG AMPUTATION Right Jan. 2008   CAROTID ENDARTERECTOMY Right    CATARACT EXTRACTION Left 12/25/14   CHOLECYSTECTOMY     COLONOSCOPY     CORONARY ARTERY BYPASS GRAFT  12/01/2007   "CABG X4" (05/12/2013)   DILATION AND CURETTAGE OF UTERUS     I & D EXTREMITY Left 06/17/2013   Procedure: IRRIGATION AND DEBRIDEMENT EXTREMITY, PLACEMENT ANTIBIOTIC STIMULAN BEADS;  Surgeon: Newt Minion, MD;  Location: [place];  Service: Orthopedics;  Laterality: Left;   INTRAMEDULLARY (IM) NAIL INTERTROCHANTERIC Left 08/20/2018   Procedure: INTRAMEDULLARY (IM) NAIL INTERTROCHANTRIC;  Surgeon: Shona Needles, MD;  Location: [place];  Service: Orthopedics;  Laterality: Left;   IRRIGATION AND DEBRIDEMENT ABSCESS Left 06/17/2013   ANKLE           DR [name]   TOE AMPUTATION Right    "took off a couple toes before the amputation"   TONSILLECTOMY       reports that she has never smoked. She has never used smokeless tobacco. She reports that she does not drink alcohol and does not use drugs.  Allergies  Allergen Reactions   Metformin Hcl Nausea And Vomiting   Ace Inhibitors Cough    cough   Actos  [Pioglitazone] Swelling   Atorvastatin Other (See Comments)    Other reaction(s): Other (See Comments) myalgias   Candesartan Cilexetil Other (See Comments)    Rash "cant recall"    Gabapentin Other (See Comments)    Dizziness and unbalanced   Isosorbide Nitrate Other (See Comments)    HA.   Quinapril Hcl Cough   Tramadol Hcl Other (See Comments)    seziure   Tape Rash    Redness, rash, itchiness Redness, rash, itchiness Redness, rash, itchiness No Plastic tape, paper tape OK Redness, rash, itchiness Redness, rash, itchiness No Plastic tape, paper tape OK    Family History  Problem Relation Age of Onset   Cancer Mother    Heart disease Father    Diabetes Father    Hyperlipidemia Father    Hypertension Father    Alzheimer's disease Maternal Grandfather      Prior to Admission medications   Medication Sig Start Date End Date Taking? Authorizing Provider  acetaminophen-codeine (TYLENOL #3) 300-30 MG tablet 1 tablet as needed 09/06/20   [provider]  AgaMatrix Ultra-Thin Lancets MISC See admin instructions. 08/31/17   [provider]  albuterol (VENTOLIN HFA) 108 (90 Base)  MCG/ACT inhaler Inhale 1-2 puffs into the lungs every 6 (six) hours as needed for wheezing or shortness of breath.  05/13/17   [provider]  ALPRAZolam Duanne Moron) 0.5 MG tablet 1 tablet 09/06/19   [provider]  aspirin 81 MG tablet Take 1 tablet (81 mg total) by mouth daily. 05/13/17   Libby Maw, MD  Blood Glucose Monitoring Suppl (FIFTY50 GLUCOSE METER 2.0) w/Device KIT See admin instructions. 11/21/20 11/21/21  [provider]  carvedilol (COREG) 6.25 MG tablet Take 1 tablet (6.25 mg total) by mouth 2 (two) times daily. 02/06/21   Minus Breeding, MD  clopidogrel (PLAVIX) 75 MG tablet Take 1 tablet (75 mg total) by mouth daily. 05/13/17   Libby Maw, MD  Continuous Blood Gluc Sensor (FREESTYLE LIBRE 14 DAY SENSOR) MISC See admin  instructions. 11/26/20   [provider]  empagliflozin (JARDIANCE) 10 MG TABS tablet Take by mouth daily.    [provider]  fluconazole (DIFLUCAN) 150 MG tablet Take 150 mg by mouth daily.    [provider]  fluconazole (DIFLUCAN) 150 MG tablet 1 tablet 09/24/20   [provider]  insulin lispro (HUMALOG) 100 UNIT/ML injection Inject into the skin. 11/21/20   [provider]  LANTUS SOLOSTAR 100 UNIT/ML Solostar Pen Inject 70 Units into the skin daily. Taking in the evening 07/13/18   [provider]  Levetiracetam 750 MG TB24 Take 1,500 mg by mouth at bedtime. 09/24/20   [provider]  lidocaine (LIDODERM) 5 % Place 1 patch onto the skin daily. evening 07/16/18   [provider]  Multiple Vitamins-Minerals (MULTIVITAMINS THER. W/MINERALS) TABS Take 1 tablet by mouth daily.      [provider]  mupirocin ointment (BACTROBAN) 2 % 1 application 2 (two) times daily.    [provider]  nitroGLYCERIN (NITROSTAT) 0.4 MG SL tablet Place 0.4 mg under the tongue every 5 (five) minutes as needed for chest pain.    [provider]  ONETOUCH ULTRA test strip CHECK BLOOD SUGAR ONCE A DAY 10/11/20   [provider]  potassium chloride SA (KLOR-CON) 20 MEQ tablet Take 20 mEq by mouth 2 (two) times daily.    [provider]  ranolazine (RANEXA) 500 MG 12 hr tablet Take 1,000 mg by mouth 2 (two) times daily.    [provider]  rosuvastatin (CRESTOR) 40 MG tablet Take 40 mg by mouth daily.    [provider]  sertraline (ZOLOFT) 100 MG tablet Take 1 tablet (100 mg total) by mouth 2 (two) times daily. 05/13/17   Libby Maw, MD  torsemide (DEMADEX) 20 MG tablet Take 20 mg by mouth daily.    [provider]    Physical Exam: Vitals:   02/20/21 0545 02/20/21 0615 02/20/21 0915 02/20/21 0917  BP: 120/74 132/67 (!) 106/46   Pulse: 88 93 82   Resp: _0 Temp:    98.5 F (36.9 C)  TempSrc:    Oral  SpO2: 95% 97% 97%   Weight:      Height:        Constitutional: Lethargic but arousable, oriented x2, no associated distress.   Skin: no rashes, no lesions, good skin turgor noted. Eyes: Pupils are equally reactive to light.  No evidence of scleral icterus or conjunctival pallor.  ENMT: Moist mucous membranes noted.  Posterior pharynx clear of any exudate or lesions.   Neck: normal, supple, no masses, no thyromegaly.  Slightly elevated jugular venous pulse at 45 degrees.   Respiratory: Notable rales in the bilateral mid and lower fields with scattered rhonchi throughout.  No evidence of significant associated wheezing.  Increased respiratory effort without accessory muscle use.  Cardiovascular: 3 out of 6 systolic murmur noted.  Regular rate and rhythm.  +1 distal bilateral lower extremity pitting edema.  No carotid bruits.  Chest:   Nontender without crepitus or deformity.   Back:   Nontender without crepitus or deformity. Abdomen: Abdomen is soft and nontender.  No evidence of intra-abdominal masses.  Positive bowel sounds noted in all quadrants.   Musculoskeletal: No joint deformity upper and lower extremities. Good ROM, no contractures. Normal muscle tone.  Neurologic: CN 2-12 grossly intact. Sensation intact.  Patient moving all 4 extremities spontaneously.  Patient is following all commands.  Patient is responsive to verbal stimuli.   Psychiatric: Patient exhibits normal mood with appropriate affect.  Patient seems to possess insight as to their current situation.     Labs on Admission: I have personally reviewed following labs and imaging studies -   CBC: Recent Labs  Lab 02/19/21 1752 02/20/21 0137 02/20/21 0740  WBC 6.2  --  4.1  NEUTROABS  --   --  3.6  HGB 8.8* 10.5* 8.9*  HCT 28.2* 31.0* 28.6*  MCV 89.8  --  91.7  PLT 141*  --  956*   Basic Metabolic Panel: Recent Labs  Lab 02/19/21 1752 02/20/21 0137 02/20/21 0740   NA 140 141 140  K 4.0 4.4 3.9  CL 100  --  101  CO2 27  --  24  GLUCOSE 165*  --  225*  BUN 49*  --  56*  CREATININE 1.98*  --  2.27*  CALCIUM 9.3  --  8.6*   GFR: Estimated Creatinine Clearance: 26.9 mL/min (A) (by C-G formula based on SCr of 2.27 mg/dL (H)). Liver Function Tests: Recent Labs  Lab 02/20/21 0740  AST 33  ALT 31  ALKPHOS 59  BILITOT 1.2  PROT 7.1  ALBUMIN 3.3*   No results for input(s): LIPASE, AMYLASE in the last 168 hours. No results for input(s): AMMONIA in the last 168 hours. Coagulation Profile: No results for input(s): INR, PROTIME in the last 168 hours. Cardiac Enzymes: No results for input(s): CKTOTAL, CKMB, CKMBINDEX, TROPONINI in the last 168 hours. BNP (last 3 results) No results for input(s): PROBNP in the last 8760 hours. HbA1C: Recent Labs    02/20/21 0740  HGBA1C 7.7*   CBG: Recent Labs  Lab 02/19/21 1844 02/20/21 0021 02/20/21 0751  GLUCAP 146* 207* 215*   Lipid Profile: No results for input(s): CHOL, HDL, LDLCALC, TRIG, CHOLHDL, LDLDIRECT in the last 72 hours. Thyroid Function Tests: No results for input(s): TSH, T4TOTAL, FREET4, T3FREE, THYROIDAB in the last 72 hours. Anemia Panel: No results for input(s): VITAMINB12, FOLATE, FERRITIN, TIBC, IRON, RETICCTPCT in the last 72 hours. Urine analysis:    Component Value Date/Time   COLORURINE COLORLESS (A) 02/19/2021 1752   APPEARANCEUR CLEAR 02/19/2021 1752   LABSPEC 1.006 02/19/2021 1752   PHURINE 5.0 02/19/2021 1752   GLUCOSEU >1,000 (A) 02/19/2021 1752   HGBUR NEGATIVE 02/19/2021 1752   BILIRUBINUR NEGATIVE 02/19/2021 1752   KETONESUR NEGATIVE 02/19/2021 1752   PROTEINUR TRACE (A) 02/19/2021 1752   UROBILINOGEN 0.2 09/02/2013 1440   NITRITE NEGATIVE 02/19/2021 1752   LEUKOCYTESUR NEGATIVE 02/19/2021 1752    Radiological Exams on Admission - Personally Reviewed: CT Head Wo Contrast  Result  Date: 02/19/2021 CLINICAL DATA:  Mental status change, increasing confusion,  EXAM: CT HEAD WITHOUT CONTRAST TECHNIQUE: Contiguous axial images were obtained from the base of the skull through the vertex without intravenous contrast. COMPARISON:  09/02/2013 CT head FINDINGS: Brain: No evidence of acute infarction, hemorrhage, cerebral edema, mass, mass effect, or midline shift. No hydrocephalus or extra-axial fluid collection. Periventricular white matter changes, likely the sequela of chronic small vessel ischemic disease. Vascular: No hyperdense vessel. Skull: Normal. Negative for fracture or focal lesion. Sinuses/Orbits: Mucous retention cyst in the right sphenoid sinus. Mild mucosal thickening in the ethmoid air cells. Status post bilateral lens replacements. Other: The mastoid air cells are well aerated. IMPRESSION: IMPRESSION No acute intracranial process. No etiology is seen for the patient's altered mental status. Electronically Signed   By: Merilyn Baba M.D.   On: 02/19/2021 18:59   DG Chest Port 1 View  Result Date: 02/20/2021 CLINICAL DATA:  Shortness of breath EXAM: PORTABLE CHEST 1 VIEW COMPARISON:  02/19/2021 FINDINGS: Mild cardiomegaly. Bibasilar opacities, slightly worsened. There are Kerley B lines along the periphery of the right lung. No sizable pleural effusion. IMPRESSION: Mild cardiomegaly with worsening bibasilar opacities, likely pulmonary edema. Electronically Signed   By: Ulyses Jarred M.D.   On: 02/20/2021 01:08   DG Chest Port 1 View  Result Date: 02/19/2021 CLINICAL DATA:  COVID hypoxia EXAM: PORTABLE CHEST 1 VIEW COMPARISON:  11/16/2020 FINDINGS: Post sternotomy changes. Cardiomegaly. No focal opacity, pleural effusion or pneumothorax. No overt pulmonary edema. IMPRESSION: No active disease.  Cardiomegaly Electronically Signed   By: Donavan Foil M.D.   On: 02/19/2021 20:06    EKG: Personally reviewed.  Rhythm is atrial tachycardia with heart rate of 129 bpm.  Concurrent evidence of intraventricular conduction delay without dynamic ST segment changes  appreciated.  Assessment/Plan  * Acute respiratory failure with hypoxia and hypercapnia (HCC) Fall patient initially presented with several days of developing encephalopathy, shortly after arrival to [facility] patient developed rapidly progressive acute hypoxic and hypercapnic respiratory failure requiring nonrebreather mask followed by BiPAP therapy ABG at the time as patient was being put on bipap revealed a pH of 7.26 and pCO2 of 63  Patient seems to be slowly improving after administration of intravenous Lasix with some effective diuresis Patient has been weaned off of BiPAP therapy and is now back and is now back to receiving oxygen supplementation via nasal cannula here in the [facility] emergency department Treating patient for concurrent COVID-19 infection with Molnupirivir and systemic steroids which is likely contributing to her respiratory compromise Administering an additional dose of intravenous Lasix today before transitioning back to oral torsemide tomorrow. Scheduled bronchodilator therapy Airborne and contact precautions Continuing as needed antitussives Zinc and vitamin C segmentation Strict input and output monitoring Daily weights  COVID-19 virus infection Patient found to be COVID PCR positive on arrival and received 1 dose of remdesivir at [place] initially did request Paxlovid instead of remdesivir however due to patient's borderline renal function (GFR currently 28) we are unable to order this. We will then instead place patient on Molnupirivir 869m BID If patient clinically worsens will consider addition of Barcitinib or Actemra  Remainder of assessment and plan as above  Acute metabolic encephalopathy Patient presenting with several days of increasing lethargy confusion and poor oral intake This was likely secondary to progressive COVID-19 infection Treating underlying infection with intravenous Molnupirivir and steroids Symptoms of encephalopathy should  to be self-limiting -husband this morning states that her mentation is  already slowly improving If confusion does not resolve we will expand work-up of encephalopathy  Acute on chronic systolic CHF (congestive heart failure) ([place]) Sudden development of rapid respiratory compromise shortly after arrival to [place] thought to be in part due to acute cardiogenic volume overload after patient received approximately 500 cc of intravenous fluid Patient gradually improved after course of BiPAP therapy and intravenous Lasix Giving additional dose of intravenous Lasix this morning with additional doses beyond that to be considered by daytime provider Please see remainder of assessment and plan above.  Atrial tachycardia (HCC) Episodic atrial tachycardia at [facility] [place] secondary to underlying respiratory distress Patient seems tenuously converted as respiratory status improved. Patient now normal sinus rhythm We will obtain serial cardiac enzymes and serial EKGs.  While I expect this to be slightly elevated due to demand ischemia will ensure it is not markedly elevated to rule out plaque rupture. Continue to monitor on telemetry Monitoring electrolytes and correcting as necessary Continue home regimen of beta-blocker therapy.  Type 2 diabetes mellitus with stage 3b chronic kidney disease, with long-term current use of insulin (HCC) Patient been placed on Accu-Cheks before every meal and nightly with sliding scale insulin Holding home regimen of oral hypoglycemics Due to tenuous oral intake placing patient on a reduced regimen of her usual basal insulin therapy Hemoglobin A1C ordered Diabetic Diet   Chronic kidney disease, stage 3b (HCC) Strict intake and output monitoring Creatinine near baseline Minimizing nephrotoxic agents as much as possible Serial chemistries to monitor renal function and electrolytes   Coronary artery disease involving native coronary artery of  native heart without angina pectoris Patient is currently chest pain free Cycling cardiac enzymes and EKGs anyway considering patient's episode of atrial tachycardia to ensure that there is no suggestion of plaque rupture albeit unlikely.  Expect for cardiac enzymes to be somewhat elevated due to demand ischemia due to respiratory compromise. Monitoring patient on telemetry Continue home regimen of antiplatelet therapy, lipid lowering therapy and AV nodal blocking therapy   Essential hypertension Resume patients home regimen of oral antihypertensives Titrate antihypertensive regimen as necessary to achieve adequate BP control PRN intravenous antihypertensives for excessively elevated blood pressure    Seizure disorder ([place]) Continue home regimen of Keppra  Gastroesophageal reflux disease Continuing home regimen of daily PPI therapy.   Mixed diabetic hyperlipidemia associated with type 2 diabetes mellitus ([place]) Continuing home regimen of lipid lowering therapy.       Code Status:  Full code  code status decision has been confirmed with: husband at the bedside Family Communication: Husband has been updated on plan of care at the bedside.  Status is: Inpatient  Remains inpatient appropriate because: Acute hypoxic and hypercapnic respiratory failure secondary to acute on chronic systolic congestive heart failure and HQPRF-16 infection complicated by acute metabolic encephalopathy requiring multiple intravenous agents including intravenous diuretics, bronchodilator therapy, antiviral therapy, supplemental oxygen, serial clinical assessments and serial blood tests with high risk of clinical decompensation.  CRITICAL CARE ATTESTATION  Patient is at significant risk of morbidity and mortality secondary to acute hypoxic and hypercapnic respiratory failure secondary to COVID-19 infection and acute on chronic systolic congestive heart failure requiring titration of noninvasive positive  pressure ventilation, intravenous diuretics, antiviral agents, systemic steroids, obtaining and interpreting numerous blood tests, telemetry and EKGs and spending time speaking to and educating family.  Critical care time spent 58 minutes.        Vernelle Emerald MD [facility] Pager [phone]  If 7PM-7AM, please contact night-coverage [email or web address] Use universal [place] password for that web site. If you do not have the password, please call the hospital operator.  02/20/2021, 9:32 AM

## 2021-02-20 NOTE — Assessment & Plan Note (Signed)
   Sudden development of rapid respiratory compromise shortly after arrival to [place] thought to be in part due to acute cardiogenic volume overload after patient received approximately 500 cc of intravenous fluid  Patient gradually improved after course of BiPAP therapy and intravenous Lasix  Giving additional dose of intravenous Lasix this morning with additional doses beyond that to be considered by daytime provider  Please see remainder of assessment and plan above.

## 2021-02-20 NOTE — Assessment & Plan Note (Signed)
.   Continuing home regimen of lipid lowering therapy.  

## 2021-02-20 NOTE — Assessment & Plan Note (Addendum)
   Fall patient initially presented with several days of developing encephalopathy, shortly after arrival to [facility] patient developed rapidly progressive acute hypoxic and hypercapnic respiratory failure requiring nonrebreather mask followed by BiPAP therapy  ABG at the time as patient was being put on bipap revealed a pH of 7.26 and pCO2 of 63   Patient seems to be slowly improving after administration of intravenous Lasix with some effective diuresis  Patient has been weaned off of BiPAP therapy and is now back and is now back to receiving oxygen supplementation via nasal cannula here in the [facility] emergency department  Treating patient for concurrent COVID-19 infection with Molnupirivir and systemic steroids which is likely contributing to her respiratory compromise  Administering an additional dose of intravenous Lasix today before transitioning back to oral torsemide tomorrow.  Scheduled bronchodilator therapy  Airborne and contact precautions  Continuing as needed antitussives  Zinc and vitamin C segmentation  Strict input and output monitoring  Daily weights

## 2021-02-20 NOTE — Assessment & Plan Note (Signed)
.   Patient been placed on Accu-Cheks before every meal and nightly with sliding scale insulin . Holding home regimen of oral hypoglycemics . Due to tenuous oral intake placing patient on a reduced regimen of her usual basal insulin therapy . Hemoglobin A1C ordered . Diabetic Diet

## 2021-02-21 DIAGNOSIS — J9601 Acute respiratory failure with hypoxia: Secondary | ICD-10-CM | POA: Diagnosis not present

## 2021-02-21 DIAGNOSIS — G9341 Metabolic encephalopathy: Secondary | ICD-10-CM | POA: Diagnosis not present

## 2021-02-21 DIAGNOSIS — U071 COVID-19: Secondary | ICD-10-CM | POA: Diagnosis not present

## 2021-02-21 DIAGNOSIS — N1832 Chronic kidney disease, stage 3b: Secondary | ICD-10-CM | POA: Diagnosis not present

## 2021-02-21 LAB — MAGNESIUM: Magnesium: 2.2 mg/dL (ref 1.7–2.4)

## 2021-02-21 LAB — COMPREHENSIVE METABOLIC PANEL
ALT: 29 U/L (ref 0–44)
AST: 30 U/L (ref 15–41)
Albumin: 3.2 g/dL — ABNORMAL LOW (ref 3.5–5.0)
Alkaline Phosphatase: 55 U/L (ref 38–126)
Anion gap: 13 (ref 5–15)
BUN: 63 mg/dL — ABNORMAL HIGH (ref 8–23)
CO2: 24 mmol/L (ref 22–32)
Calcium: 8.5 mg/dL — ABNORMAL LOW (ref 8.9–10.3)
Chloride: 96 mmol/L — ABNORMAL LOW (ref 98–111)
Creatinine, Ser: 2.5 mg/dL — ABNORMAL HIGH (ref 0.44–1.00)
GFR, Estimated: 21 mL/min — ABNORMAL LOW (ref >60)
Glucose, Bld: 328 mg/dL — ABNORMAL HIGH (ref 70–99)
Potassium: 3.9 mmol/L (ref 3.5–5.1)
Sodium: 133 mmol/L — ABNORMAL LOW (ref 135–145)
Total Bilirubin: 0.4 mg/dL (ref 0.3–1.2)
Total Protein: 6.7 g/dL (ref 6.5–8.1)

## 2021-02-21 LAB — C-REACTIVE PROTEIN: CRP: 3.6 mg/dL — ABNORMAL HIGH (ref <1.0)

## 2021-02-21 LAB — CBC WITH DIFFERENTIAL/PLATELET
Abs Immature Granulocytes: 0.03 10*3/uL (ref 0.00–0.07)
Basophils Absolute: 0 10*3/uL (ref 0.0–0.1)
Basophils Relative: 0 %
Eosinophils Absolute: 0 10*3/uL (ref 0.0–0.5)
Eosinophils Relative: 0 %
HCT: 27.9 % — ABNORMAL LOW (ref 36.0–46.0)
Hemoglobin: 8.9 g/dL — ABNORMAL LOW (ref 12.0–15.0)
Immature Granulocytes: 1 %
Lymphocytes Relative: 6 %
Lymphs Abs: 0.3 10*3/uL — ABNORMAL LOW (ref 0.7–4.0)
MCH: 28.3 pg (ref 26.0–34.0)
MCHC: 31.9 g/dL (ref 30.0–36.0)
MCV: 88.9 fL (ref 80.0–100.0)
Monocytes Absolute: 0.3 10*3/uL (ref 0.1–1.0)
Monocytes Relative: 6 %
Neutro Abs: 4.7 10*3/uL (ref 1.7–7.7)
Neutrophils Relative %: 87 %
Platelets: 161 10*3/uL (ref 150–400)
RBC: 3.14 MIL/uL — ABNORMAL LOW (ref 3.87–5.11)
RDW: 16.5 % — ABNORMAL HIGH (ref 11.5–15.5)
WBC: 5.4 10*3/uL (ref 4.0–10.5)
nRBC: 0 % (ref 0.0–0.2)

## 2021-02-21 LAB — GLUCOSE, CAPILLARY
Glucose-Capillary: 208 mg/dL — ABNORMAL HIGH (ref 70–99)
Glucose-Capillary: 230 mg/dL — ABNORMAL HIGH (ref 70–99)
Glucose-Capillary: 288 mg/dL — ABNORMAL HIGH (ref 70–99)
Glucose-Capillary: 302 mg/dL — ABNORMAL HIGH (ref 70–99)
Glucose-Capillary: 333 mg/dL — ABNORMAL HIGH (ref 70–99)

## 2021-02-21 LAB — D-DIMER, QUANTITATIVE: D-Dimer, Quant: 0.88 ug/mL-FEU — ABNORMAL HIGH (ref 0.00–0.50)

## 2021-02-21 MED ORDER — INSULIN ASPART 100 UNIT/ML IJ SOLN
3.0000 [IU] | Freq: Three times a day (TID) | INTRAMUSCULAR | Status: DC
  Administered 2021-02-21 – 2021-02-22 (×4): 3 [IU] via SUBCUTANEOUS

## 2021-02-21 MED ORDER — INSULIN GLARGINE-YFGN 100 UNIT/ML ~~LOC~~ SOLN
38.0000 [IU] | Freq: Every day | SUBCUTANEOUS | Status: DC
  Administered 2021-02-22: 38 [IU] via SUBCUTANEOUS
  Filled 2021-02-21: qty 0.38

## 2021-02-21 MED ORDER — ALPRAZOLAM 0.5 MG PO TABS
0.5000 mg | ORAL_TABLET | Freq: Two times a day (BID) | ORAL | Status: DC | PRN
  Administered 2021-02-21 – 2021-02-22 (×2): 0.5 mg via ORAL
  Filled 2021-02-21 (×2): qty 1

## 2021-02-21 NOTE — Consult Note (Signed)
[place] Nurse wound consult note Consultation was completed by review of records, images and assistance from the bedside nurse/clinical staff.   Reason for Consult: sacrum and knees  Wound type: Stage 1 sacrum Partial thickness skin loss bilateral knees  Pressure Injury POA: Yes Measurement: see nursing flow sheets Wound QIO:NGEXBM skin over sacrum; partial thickness skin loss on the knees Drainage (amount, consistency, odor) none documented  Periwound: intact  Dressing procedure/placement/frequency:  Silicone foam to each area described to protect and insulate. Implement per skin care order set  Turn and reposition at least every 2 hours Communicated with bedside nurse on above.  Re consult if needed, will not follow at this time. Thanks  Zacary Bauer [employer], RN,CWOCN, CNS, [place] [phone])

## 2021-02-21 NOTE — Progress Notes (Signed)
NEW ADMISSION NOTE New Admission Note:   Arrival Method: [place] Mental Orientation: A&O x4 Telemetry: [id number] Assessment: Completed Skin: Pt has stage 1 on sacrum, Pt has stage 2 on each bilateral stump. IV: 20G LAC, 20G R hand, 22G RAC Pain: 0/10 Tubes: none present Safety Measures: Safety Fall Prevention Plan has been given, discussed and signed Admission: Completed [facility] Orientation: Patient has been orientated to the room, unit and staff. Family: none present   Orders have been reviewed and implemented. Will continue to monitor the patient. Call light has been placed within reach and bed alarm has been activated.

## 2021-02-21 NOTE — Progress Notes (Signed)
Progress Note  Patient Name: [name] Date of Encounter: 02/21/2021  Primary Cardiologist: Dr. Percival Spanish  Subjective   Feels much better; No CP or SOB  Inpatient Medications    Scheduled Meds:  albuterol  2 puff Inhalation Q6H   vitamin C  500 mg Oral Daily   aspirin EC  81 mg Oral Daily   carvedilol  6.25 mg Oral BID WC   clopidogrel  75 mg Oral Daily   enoxaparin (LOVENOX) injection  30 mg Subcutaneous Daily   insulin aspart  0-15 Units Subcutaneous TID AC & HS   insulin aspart  3 Units Subcutaneous TID WC   [START ON 02/22/2021] insulin glargine-yfgn  38 Units Subcutaneous Daily   levETIRAcetam  1,000 mg Oral QHS   molnupiravir EUA  4 capsule Oral BID   predniSONE  50 mg Oral Q breakfast   ranolazine  500 mg Oral BID   rosuvastatin  10 mg Oral Daily   sertraline  100 mg Oral BID   sodium chloride flush  3 mL Intravenous Q12H   torsemide  20 mg Oral Daily   zinc sulfate  220 mg Oral Daily   Continuous Infusions:  PRN Meds: acetaminophen **OR** acetaminophen, albuterol, ALPRAZolam, guaiFENesin-dextromethorphan, hydrALAZINE, nitroGLYCERIN, ondansetron **OR** ondansetron (ZOFRAN) IV, polyethylene glycol   Vital Signs    Vitals:   02/21/21 0103 02/21/21 0651 02/21/21 0652 02/21/21 0900  BP: 127/70 (!) 148/75  131/78  Pulse: 96 92  89  Resp: 20 18  18   Temp: 98 F (36.7 C) 98.3 F (36.8 C)  98.3 F (36.8 C)  TempSrc: Oral Oral  Oral  SpO2: 96% 93%  96%  Weight:   82.3 kg   Height:   5\' 5"  (1.651 m)     Intake/Output Summary (Last 24 hours) at 02/21/2021 1232 Last data filed at 02/21/2021 1036 Gross per 24 hour  Intake --  Output 1700 ml  Net -1700 ml    I/O since admission:   Filed Weights   02/19/21 1735 02/21/21 0652  Weight: 82.2 kg 82.3 kg    Telemetry    Sinus - Personally Reviewed  ECG    ECG (independently read by me): NSR at 83, LBBB  Physical Exam    BP 131/78 (BP Location: Right Arm)   Pulse 89   Temp 98.3 F (36.8 C)  (Oral)   Resp 18   Ht 5\' 5"  (1.651 m)   Wt 82.3 kg   SpO2 96%   BMI 30.19 kg/m  General: Alert, oriented, no distress.  Skin: normal turgor, no rashes, warm and dry HEENT: Normocephalic, atraumatic. Pupils equal round and reactive to light; sclera anicteric; extraocular muscles intact;  Nose without nasal septal hypertrophy Mouth/Parynx benign; Mallinpatti scale Neck: No JVD, no carotid bruits; normal carotid upstroke Lungs: clear to ausculatation and percussion; no wheezing or rales Chest wall: without tenderness to palpitation Heart: PMI not displaced, RRR, s1 s2 normal, 1/6 systolic murmur, no diastolic murmur, no rubs, gallops, thrills, or heaves Abdomen: soft, nontender; no hepatosplenomehaly, BS+; abdominal aorta nontender and not dilated by palpation. Back: no CVA tenderness Pulses 2+ Musculoskeletal: B BKA Extremities: no clubbing cyanosis or edema, Homan's sign negative  Neurologic: grossly nonfocal; Cranial nerves grossly wnl Psychologic: Normal mood and affect   Labs    Chemistry Recent Labs  Lab 02/19/21 1752 02/20/21 0137 02/20/21 0740 02/21/21 0054  NA 140 141 140 133*  K 4.0 4.4 3.9 3.9  CL 100  --  101 96*  CO2 27  --  24 24  GLUCOSE 165*  --  225* 328*  BUN 49*  --  56* 63*  CREATININE 1.98*  --  2.27* 2.50*  CALCIUM 9.3  --  8.6* 8.5*  PROT  --   --  7.1 6.7  ALBUMIN  --   --  3.3* 3.2*  AST  --   --  33 30  ALT  --   --  31 29  ALKPHOS  --   --  59 55  BILITOT  --   --  1.2 0.4  GFRNONAA 28*  --  24* 21*  ANIONGAP 13  --  15 13     Hematology Recent Labs  Lab 02/19/21 1752 02/20/21 0137 02/20/21 0740 02/21/21 0054  WBC 6.2  --  4.1 5.4  RBC 3.14*  --  3.12* 3.14*  HGB 8.8* 10.5* 8.9* 8.9*  HCT 28.2* 31.0* 28.6* 27.9*  MCV 89.8  --  91.7 88.9  MCH 28.0  --  28.5 28.3  MCHC 31.2  --  31.1 31.9  RDW 16.8*  --  16.8* 16.5*  PLT 141*  --  148* 161   HS TROPONIN:  811 > 492  Cardiac EnzymesNo results for input(s): TROPONINI in the  last 168 hours. No results for input(s): TROPIPOC in the last 168 hours.   BNP Recent Labs  Lab 02/20/21 1318  BNP 1,478.8*     DDimer  Recent Labs  Lab 02/20/21 0740 02/21/21 0054  DDIMER 2.13* 0.88*     Lipid Panel     Component Value Date/Time   CHOL 113 08/30/2013 0550   TRIG 149 08/30/2013 0550   HDL 36 (L) 08/30/2013 0550   CHOLHDL 3.1 08/30/2013 0550   VLDL 30 08/30/2013 0550   LDLCALC 47 08/30/2013 0550     Radiology    CT Head Wo Contrast  Result Date: 02/19/2021 CLINICAL DATA:  Mental status change, increasing confusion, EXAM: CT HEAD WITHOUT CONTRAST TECHNIQUE: Contiguous axial images were obtained from the base of the skull through the vertex without intravenous contrast. COMPARISON:  09/02/2013 CT head FINDINGS: Brain: No evidence of acute infarction, hemorrhage, cerebral edema, mass, mass effect, or midline shift. No hydrocephalus or extra-axial fluid collection. Periventricular white matter changes, likely the sequela of chronic small vessel ischemic disease. Vascular: No hyperdense vessel. Skull: Normal. Negative for fracture or focal lesion. Sinuses/Orbits: Mucous retention cyst in the right sphenoid sinus. Mild mucosal thickening in the ethmoid air cells. Status post bilateral lens replacements. Other: The mastoid air cells are well aerated. IMPRESSION: IMPRESSION No acute intracranial process. No etiology is seen for the patient's altered mental status. Electronically Signed   By: Merilyn Baba M.D.   On: 02/19/2021 18:59   DG Chest Port 1 View  Result Date: 02/20/2021 CLINICAL DATA:  Shortness of breath EXAM: PORTABLE CHEST 1 VIEW COMPARISON:  02/19/2021 FINDINGS: Mild cardiomegaly. Bibasilar opacities, slightly worsened. There are Kerley B lines along the periphery of the right lung. No sizable pleural effusion. IMPRESSION: Mild cardiomegaly with worsening bibasilar opacities, likely pulmonary edema. Electronically Signed   By: Ulyses Jarred M.D.   On:  02/20/2021 01:08   DG Chest Port 1 View  Result Date: 02/19/2021 CLINICAL DATA:  COVID hypoxia EXAM: PORTABLE CHEST 1 VIEW COMPARISON:  11/16/2020 FINDINGS: Post sternotomy changes. Cardiomegaly. No focal opacity, pleural effusion or pneumothorax. No overt pulmonary edema. IMPRESSION: No active disease.  Cardiomegaly Electronically Signed   By: Madie Reno.D.  On: 02/19/2021 20:06    Cardiac Studies   ECHO: 01/30/2021 IMPRESSIONS Left ventricular ejection fraction, by estimation, is 60 to 65%. The left ventricle has normal function. The left ventricle has no regional wall motion abnormalities. Left ventricular diastolic parameters are indeterminate. 2. Right ventricular systolic function is normal. The right ventricular size is normal. 3. Left atrial size was mildly dilated.The mitral valve is degenerative. Mild to moderate mitral valve regurgitation. Moderatemitral stenosis. Severe mitral annular calcification. 4.The aortic valve is calcified. There is severe calcifcation of the aortic valve. There is severe thickening of the aortic valve. Aortic valve regurgitation is not visualized. Moderate to severe aortic valve stenosis. 5.The inferior vena cava is normal in size with greater than 50% respiratory variability, suggesting right atrial pressure of 3 mmHg.  Comparison(s): Outside echocardiogram September 2022 demonstrated an EF of 30 to 35%. There was thinning and akinesis of the mid anterior, mid anteroseptal, basal anterior and basal anteroseptal segments. There was moderately thickened aortic valve with severe aortic stenosis. There was mild mitral regurgitation.  Patient Profile     HAZLEIGH MCCLEAVE is a 65 y.o. female with a hx of CABG x4 '08, diabetes, bilateral BKAs, carotid stenosis status post CEA '07, hypertension, hyperlipidemia, seizures and CKD who is being seen 02/20/2021 for the evaluation of elevated troponin at the request of Dr. Darrick Meigs.    Assessment & Plan     Elevated Troponin with known hx of CAD s/p prior CABG:  hsTn 811 > 492 probably due to demand ischemia; no ACS  EKG showed SR with known LBBB.  No reports of chest pain prior to admission. -- continue ASA, BB   Acute respiratory failure with hypoxia 2/2 COVID-19/HFpEF: Suspect multifactorial.  On admission acute decline requiring nonrebreather and then BiPAP.  Since then has been weaned from BiPAP back to nasal cannula.  Initial chest x-ray negative for edema, but given IV fluids.  Repeat chest x-ray noted bibasilar opacities.  Improved with IV Lasix.  Transitioned to oral therapy with torsemide. --COVID treatment per primary  COVID: per primary team    Acute metabolic encephalopathy: CT head negative for acute change; RESOLVED -- per primary   Moderate to Severe aortic stenosis: Echo 01/30/2021 with moderately-severe aortic stenosis. Mean gradient of 25 mmHg.  According to office notes there has been discussion regarding potential TAVR work-up as an outpatient I the future  if progresses; Probably repeat echo as out patient following Covid     Hypertension: stable -- continue coreg    CKD stage III: Cr 1.98>>2.27 >> 2.50 --follow BMET in am    DM: SSI -- Hgb A1c 7.7  Will sign off,   Signed, Troy Sine, MD, [facility] 02/21/2021, 12:32 PM

## 2021-02-21 NOTE — Plan of Care (Signed)

## 2021-02-21 NOTE — Progress Notes (Addendum)
[facility]  PROGRESS NOTE  KARESSA ONORATO IHK:[id number] DOB: Jul 24, 1955 DOA: 02/19/2021 PCP: [name], MD   Brief HPI:   65 year old female with history of diabetes mellitus type 2, CKD stage IIIb, hypertension, hyperlipidemia, CHF with EF of 60 to 65%, moderate to severe aortic stenosis, moderate mitral valve stenosis, peripheral vascular disease s/p right carotid endarterectomy, CAD s/p CABG, s/p bilateral BKA, seizure disorder mood came to [facility] as transfer from [facility] for COVID-19 infection and hypoxemia.   In the ED patient was given 125 mg Solu-Medrol as well as remdesivir.  Patient was placed on BiPAP due to increased work of breathing, initially received 500 cc of IV fluid in the ED.  She also received 40 mg of Lasix as there was concern for pulmonary edema.   Patient's troponin went up to 811, BNP 1478  Cardiology was consulted  Subjective   Today patient feels better, she is no longer requiring oxygen.  Denies shortness of breath.  Troponin is down to 492   Assessment/Plan:    Acute hypoxemic respiratory failure -Resolved; likely multifactorial from COVID-19 infection as well as HFpEF -Patient briefly required BiPAP as she was given IV fluids, BiPAP was removed after she diuresed well with IV Lasix -Patient started on  Molnupirivir and systemic steroids for COVID-19 infection -Also started on torsemide per cardiology  COVID-19 infection -She has been weaned off oxygen -She received 1 dose of remdesivir at [facility] [place] -Patient started on  Molnupirivir and systemic steroids  -Clinically she has improved; D-dimer is down to 0.88, CRP 3.6 -We will likely discharge her home in next 24 hours on Molnupirivir and  steroids   Elevated troponin -Patient had elevated troponin of 811, BNP 1478 -She has a history of CAD s/p CABG -Cardiology was consulted, patient is asymptomatic -Troponin is down to 422 -Cardiology feels it is demand  ischemia from COVID-19 infection/acute hypoxemic respiratory failure -No further intervention recommended  Diabetes mellitus type 2 -CBG elevated due to prednisone -We will increase the dose of Lantus to 38 units subcu daily -Start NovoLog 3 units 3 times daily meal coverage  Coronary artery disease -She has history of CABG x4 in 2008 -Seen by cardiology for elevated troponin as above -No intervention planned -Continue aspirin, Plavix, Coreg, rosuvastatin  Acute on chronic HFpEF -Resolved with IV diuresis with Lasix -Started on p.o. torsemide -Not requiring oxygen  Hypertension -Blood pressure is stable -Continue Coreg  CKD stage IV -Creatinine 2.50 -Follow BMP in am    Medications     albuterol  2 puff Inhalation Q6H   vitamin C  500 mg Oral Daily   aspirin EC  81 mg Oral Daily   carvedilol  6.25 mg Oral BID WC   clopidogrel  75 mg Oral Daily   enoxaparin (LOVENOX) injection  30 mg Subcutaneous Daily   insulin aspart  0-15 Units Subcutaneous TID AC & HS   insulin aspart  3 Units Subcutaneous TID WC   [START ON 02/22/2021] insulin glargine-yfgn  38 Units Subcutaneous Daily   levETIRAcetam  1,000 mg Oral QHS   molnupiravir EUA  4 capsule Oral BID   predniSONE  50 mg Oral Q breakfast   ranolazine  500 mg Oral BID   rosuvastatin  10 mg Oral Daily   sertraline  100 mg Oral BID   sodium chloride flush  3 mL Intravenous Q12H   torsemide  20 mg Oral Daily   zinc sulfate  220 mg Oral  Daily     Data Reviewed:   CBG:  Recent Labs  Lab 02/20/21 1216 02/20/21 1737 02/21/21 0019 02/21/21 0658 02/21/21 1218  GLUCAP 263* 210* 288* 208* 230*    SpO2: 96 % O2 Flow Rate (L/min): 2 L/min FiO2 (%): 50 %    Vitals:   02/21/21 0103 02/21/21 0651 02/21/21 0652 02/21/21 0900  BP: 127/70 (!) 148/75  131/78  Pulse: 96 92  89  Resp: 20 18  18   Temp: 98 F (36.7 C) 98.3 F (36.8 C)  98.3 F (36.8 C)  TempSrc: Oral Oral  Oral  SpO2: 96% 93%  96%  Weight:   82.3 kg    Height:   5\' 5"  (1.651 m)      Intake/Output Summary (Last 24 hours) at 02/21/2021 1510 Last data filed at 02/21/2021 1310 Gross per 24 hour  Intake 480 ml  Output 2200 ml  Net -1720 ml    12/06 1901 - 12/08 0700 In: 197.4  Out: 1450 [Urine:1450]  Filed Weights   02/19/21 1735 02/21/21 0652  Weight: 82.2 kg 82.3 kg    Data Reviewed: Basic Metabolic Panel: Recent Labs  Lab 02/19/21 1752 02/20/21 0137 02/20/21 0740 02/20/21 1318 02/21/21 0054  NA 140 141 140  --  133*  K 4.0 4.4 3.9  --  3.9  CL 100  --  101  --  96*  CO2 27  --  24  --  24  GLUCOSE 165*  --  225*  --  328*  BUN 49*  --  56*  --  63*  CREATININE 1.98*  --  2.27*  --  2.50*  CALCIUM 9.3  --  8.6*  --  8.5*  MG  --   --   --  2.2 2.2   Liver Function Tests: Recent Labs  Lab 02/20/21 0740 02/21/21 0054  AST 33 30  ALT 31 29  ALKPHOS 59 55  BILITOT 1.2 0.4  PROT 7.1 6.7  ALBUMIN 3.3* 3.2*   No results for input(s): LIPASE, AMYLASE in the last 168 hours. No results for input(s): AMMONIA in the last 168 hours. CBC: Recent Labs  Lab 02/19/21 1752 02/20/21 0137 02/20/21 0740 02/21/21 0054  WBC 6.2  --  4.1 5.4  NEUTROABS  --   --  3.6 4.7  HGB 8.8* 10.5* 8.9* 8.9*  HCT 28.2* 31.0* 28.6* 27.9*  MCV 89.8  --  91.7 88.9  PLT 141*  --  148* 161   Cardiac Enzymes: No results for input(s): CKTOTAL, CKMB, CKMBINDEX, TROPONINI in the last 168 hours. BNP (last 3 results) Recent Labs    02/20/21 1318  BNP 1,478.8*    ProBNP (last 3 results) No results for input(s): PROBNP in the last 8760 hours.  CBG: Recent Labs  Lab 02/20/21 1216 02/20/21 1737 02/21/21 0019 02/21/21 0658 02/21/21 1218  GLUCAP 263* 210* 288* 208* 230*       Radiology Reports  CT Head Wo Contrast  Result Date: 02/19/2021 CLINICAL DATA:  Mental status change, increasing confusion, EXAM: CT HEAD WITHOUT CONTRAST TECHNIQUE: Contiguous axial images were obtained from the base of the skull through the vertex  without intravenous contrast. COMPARISON:  09/02/2013 CT head FINDINGS: Brain: No evidence of acute infarction, hemorrhage, cerebral edema, mass, mass effect, or midline shift. No hydrocephalus or extra-axial fluid collection. Periventricular white matter changes, likely the sequela of chronic small vessel ischemic disease. Vascular: No hyperdense vessel. Skull: Normal. Negative for fracture or focal lesion. Sinuses/Orbits: Mucous  retention cyst in the right sphenoid sinus. Mild mucosal thickening in the ethmoid air cells. Status post bilateral lens replacements. Other: The mastoid air cells are well aerated. IMPRESSION: IMPRESSION No acute intracranial process. No etiology is seen for the patient's altered mental status. Electronically Signed   By: Merilyn Baba M.D.   On: 02/19/2021 18:59   DG Chest Port 1 View  Result Date: 02/20/2021 CLINICAL DATA:  Shortness of breath EXAM: PORTABLE CHEST 1 VIEW COMPARISON:  02/19/2021 FINDINGS: Mild cardiomegaly. Bibasilar opacities, slightly worsened. There are Kerley B lines along the periphery of the right lung. No sizable pleural effusion. IMPRESSION: Mild cardiomegaly with worsening bibasilar opacities, likely pulmonary edema. Electronically Signed   By: Ulyses Jarred M.D.   On: 02/20/2021 01:08   DG Chest Port 1 View  Result Date: 02/19/2021 CLINICAL DATA:  COVID hypoxia EXAM: PORTABLE CHEST 1 VIEW COMPARISON:  11/16/2020 FINDINGS: Post sternotomy changes. Cardiomegaly. No focal opacity, pleural effusion or pneumothorax. No overt pulmonary edema. IMPRESSION: No active disease.  Cardiomegaly Electronically Signed   By: Donavan Foil M.D.   On: 02/19/2021 20:06       Antibiotics: Anti-infectives (From admission, onward)    Start     Dose/Rate Route Frequency Ordered Stop   02/20/21 1000  remdesivir 100 mg in sodium chloride 0.9 % 100 mL IVPB  Status:  Discontinued        100 mg 200 mL/hr over 30 Minutes Intravenous Daily 02/19/21 2014 02/20/21 0917    02/20/21 1000  molnupiravir EUA (LAGEVRIO) capsule 800 mg        4 capsule Oral 2 times daily 02/20/21 0917 02/25/21 0959   02/19/21 2130  remdesivir 100 mg in sodium chloride 0.9 % 100 mL IVPB       See Hyperspace for full Linked Orders Report.   100 mg 200 mL/hr over 30 Minutes Intravenous  Once 02/19/21 2014 02/19/21 2210   02/19/21 2100  remdesivir 100 mg in sodium chloride 0.9 % 100 mL IVPB       See Hyperspace for full Linked Orders Report.   100 mg 200 mL/hr over 30 Minutes Intravenous  Once 02/19/21 2014 02/19/21 2210         DVT prophylaxis: Lovenox  Code Status: Full code  Family Communication: Discussed with family at bedside   Consultants: Cardiology  Procedures:     Objective    Physical Examination:   General: Appears in no acute distress Cardiovascular: S1-S2, regular, no murmur auscultated Respiratory: Bibasilar crackles auscultated Abdomen: Abdomen is soft, nontender, no organomegaly Extremities: Bilateral BKA Neurologic: Alert, oriented x3, intact insight and judgment, no focal deficit noted   Status is: Inpatient  Dispo: [name] from: Home              Anticipated d/c is to: Home              Anticipated d/c date is: 02/22/2021              Patient currently not stable for discharge  Barrier to discharge-ongoing treatment for COVID-19 infection  COVID-19 Labs  Recent Labs    02/20/21 0740 02/21/21 0054  DDIMER 2.13* 0.88*  CRP 3.5* 3.6*    Lab Results  Component Value Date   SARSCOV2NAA POSITIVE (A) 02/19/2021   [place] NEGATIVE 08/19/2018     Pressure Injury 02/20/21 Sacrum Stage 1 -  Intact skin with non-blanchable redness of a localized area usually over a bony prominence. (Active)  02/20/21 2214  Location: Sacrum  Location Orientation:   Staging: Stage 1 -  Intact skin with non-blanchable redness of a localized area usually over a bony prominence.  Wound Description (Comments):   Present on Admission: Yes      Pressure Injury 02/20/21 Knee Anterior;Left Stage 2 -  Partial thickness loss of dermis presenting as a shallow open injury with a red, pink wound bed without slough. (Active)  02/20/21 2210  Location: Knee  Location Orientation: Anterior;Left  Staging: Stage 2 -  Partial thickness loss of dermis presenting as a shallow open injury with a red, pink wound bed without slough.  Wound Description (Comments):   Present on Admission: Yes     Pressure Injury 02/20/21 Knee Anterior;Right Stage 2 -  Partial thickness loss of dermis presenting as a shallow open injury with a red, pink wound bed without slough. (Active)  02/20/21 2214  Location: Knee  Location Orientation: Anterior;Right  Staging: Stage 2 -  Partial thickness loss of dermis presenting as a shallow open injury with a red, pink wound bed without slough.  Wound Description (Comments):   Present on Admission: Yes        Recent Results (from the past 240 hour(s))  Resp Panel by RT-PCR (Flu A&B, Covid)     Status: Abnormal   Collection Time: 02/19/21  5:52 PM   Specimen: Nasopharyngeal(NP) swabs in vial transport medium  Result Value Ref Range Status   SARS Coronavirus 2 by RT PCR POSITIVE (A) NEGATIVE Final    Comment: RESULT CALLED TO, READ BACK BY AND VERIFIED WITH: PATTY DOWD RN 02/19/2021 1902 [place] (NOTE) SARS-CoV-2 target nucleic acids are DETECTED.  The SARS-CoV-2 RNA is generally detectable in upper respiratory specimens during the acute phase of infection. Positive results are indicative of the presence of the identified virus, but do not rule out bacterial infection or co-infection with other pathogens not detected by the test. Clinical correlation with patient history and other diagnostic information is necessary to determine patient infection status. The expected result is Negative.  Fact Sheet for Patients: EntrepreneurPulse.com.au  Fact Sheet for Healthcare  Providers: IncredibleEmployment.be  This test is not yet approved or cleared by the [place] FDA and  has been authorized for detection and/or diagnosis of SARS-CoV-2 by FDA under an Emergency Use Authorization (EUA).  This EUA will remain in effect (meaning this test can be  used) for the duration of  the COVID-19 declaration under Section 564(b)(1) of the Act, 21 U.S.C. section 360bbb-3(b)(1), unless the authorization is terminated or revoked sooner.     Influenza A by PCR NEGATIVE NEGATIVE Final   Influenza B by PCR NEGATIVE NEGATIVE Final    Comment: (NOTE) The Xpert Xpress SARS-CoV-2/FLU/RSV plus assay is intended as an aid in the diagnosis of influenza from Nasopharyngeal swab specimens and should not be used as a sole basis for treatment. Nasal washings and aspirates are unacceptable for Xpert Xpress SARS-CoV-2/FLU/RSV testing.  Fact Sheet for Patients: EntrepreneurPulse.com.au  Fact Sheet for Healthcare Providers: IncredibleEmployment.be  This test is not yet approved or cleared by the [place] FDA and has been authorized for detection and/or diagnosis of SARS-CoV-2 by FDA under an Emergency Use Authorization (EUA). This EUA will remain in effect (meaning this test can be used) for the duration of the COVID-19 declaration under Section 564(b)(1) of the Act, 21 U.S.C. section 360bbb-3(b)(1), unless the authorization is terminated or revoked.  Performed at [facility], [address] [place], [place] [address]   Culture, blood (  routine x 2)     Status: None (Preliminary result)   Collection Time: 02/19/21  6:43 PM   Specimen: BLOOD  Result Value Ref Range Status   Specimen Description   Final    BLOOD RIGHT ANTECUBITAL Performed at [facility], [address], [place], [place] [address]    Special Requests   Final    BOTTLES DRAWN AEROBIC AND ANAEROBIC Blood  Culture results may not be optimal due to an excessive volume of blood received in culture bottles Performed at [facility], [address] [place], [place] [address]    Culture   Final    NO GROWTH 2 DAYS Performed at [facility], [place] [address], [place],  [address]    Report Status PENDING  Incomplete    Oswald Hillock   [facility] If 7PM-7AM, please contact night-coverage at [email or web address], Office  [phone]   02/21/2021, 3:10 PM  LOS: 1 day

## 2021-02-21 NOTE — Progress Notes (Signed)
Inpatient Diabetes Program Recommendations  AACE/ADA: New Consensus Statement on Inpatient Glycemic Control  Target Ranges:  Prepandial:   less than 140 mg/dL      Peak postprandial:   less than 180 mg/dL (1-2 hours)      Critically ill patients:  140 - 180 mg/dL    Latest Reference Range & Units 02/20/21 07:51 02/20/21 12:16 02/20/21 17:37 02/21/21 00:19 02/21/21 06:58  Glucose-Capillary 70 - 99 mg/dL 215 (H) 263 (H) 210 (H) 288 (H) 208 (H)   Review of Glycemic Control  Diabetes history: DM2 Outpatient Diabetes medications: Lantus 38 units QHS, Humalog 12 units BID with meals, Jardiance 10 mg daily Current orders for Inpatient glycemic control: Semglee 35 units daily, Novolog 0-15 units AC&HS; Prednisone 50 mg QAM  Inpatient Diabetes Program Recommendations:    Insulin: If steroids are continued, please consider increasing Semglee to 38 units daily and ordering Novolog 3 units TID with meals for meal coverage if patient eats at least 50% of meals.  Thanks, Barnie Alderman, RN, MSN, CDE Diabetes Coordinator Inpatient Diabetes Program [phone] (Team Pager from 8am to 5pm)

## 2021-02-22 LAB — CBC WITH DIFFERENTIAL/PLATELET
Abs Immature Granulocytes: 0.04 10*3/uL (ref 0.00–0.07)
Basophils Absolute: 0 10*3/uL (ref 0.0–0.1)
Basophils Relative: 0 %
Eosinophils Absolute: 0 10*3/uL (ref 0.0–0.5)
Eosinophils Relative: 0 %
HCT: 28.5 % — ABNORMAL LOW (ref 36.0–46.0)
Hemoglobin: 9.3 g/dL — ABNORMAL LOW (ref 12.0–15.0)
Immature Granulocytes: 1 %
Lymphocytes Relative: 8 %
Lymphs Abs: 0.5 10*3/uL — ABNORMAL LOW (ref 0.7–4.0)
MCH: 28.8 pg (ref 26.0–34.0)
MCHC: 32.6 g/dL (ref 30.0–36.0)
MCV: 88.2 fL (ref 80.0–100.0)
Monocytes Absolute: 0.5 10*3/uL (ref 0.1–1.0)
Monocytes Relative: 8 %
Neutro Abs: 5.2 10*3/uL (ref 1.7–7.7)
Neutrophils Relative %: 83 %
Platelets: 163 10*3/uL (ref 150–400)
RBC: 3.23 MIL/uL — ABNORMAL LOW (ref 3.87–5.11)
RDW: 16.7 % — ABNORMAL HIGH (ref 11.5–15.5)
WBC: 6.2 10*3/uL (ref 4.0–10.5)
nRBC: 0 % (ref 0.0–0.2)

## 2021-02-22 LAB — COMPREHENSIVE METABOLIC PANEL
ALT: 22 U/L (ref 0–44)
AST: 29 U/L (ref 15–41)
Albumin: 3.3 g/dL — ABNORMAL LOW (ref 3.5–5.0)
Alkaline Phosphatase: 50 U/L (ref 38–126)
Anion gap: 12 (ref 5–15)
BUN: 57 mg/dL — ABNORMAL HIGH (ref 8–23)
CO2: 27 mmol/L (ref 22–32)
Calcium: 8.5 mg/dL — ABNORMAL LOW (ref 8.9–10.3)
Chloride: 97 mmol/L — ABNORMAL LOW (ref 98–111)
Creatinine, Ser: 1.94 mg/dL — ABNORMAL HIGH (ref 0.44–1.00)
GFR, Estimated: 29 mL/min — ABNORMAL LOW (ref >60)
Glucose, Bld: 239 mg/dL — ABNORMAL HIGH (ref 70–99)
Potassium: 4 mmol/L (ref 3.5–5.1)
Sodium: 136 mmol/L (ref 135–145)
Total Bilirubin: 0.7 mg/dL (ref 0.3–1.2)
Total Protein: 6.8 g/dL (ref 6.5–8.1)

## 2021-02-22 LAB — C-REACTIVE PROTEIN: CRP: 2.5 mg/dL — ABNORMAL HIGH (ref <1.0)

## 2021-02-22 LAB — GLUCOSE, CAPILLARY
Glucose-Capillary: 165 mg/dL — ABNORMAL HIGH (ref 70–99)
Glucose-Capillary: 208 mg/dL — ABNORMAL HIGH (ref 70–99)

## 2021-02-22 LAB — MAGNESIUM: Magnesium: 2.2 mg/dL (ref 1.7–2.4)

## 2021-02-22 LAB — D-DIMER, QUANTITATIVE: D-Dimer, Quant: 0.55 ug/mL-FEU — ABNORMAL HIGH (ref 0.00–0.50)

## 2021-02-22 MED ORDER — GUAIFENESIN ER 600 MG PO TB12: 600.0000 mg | ORAL_TABLET | Freq: Two times a day (BID) | ORAL | 0 refills | Status: AC

## 2021-02-22 MED ORDER — PREDNISONE 10 MG PO TABS: ORAL_TABLET | ORAL | 0 refills | Status: DC

## 2021-02-22 MED ORDER — MOLNUPIRAVIR EUA 200MG CAPSULE: 4.0000 | ORAL_CAPSULE | Freq: Two times a day (BID) | ORAL | 0 refills | Status: AC

## 2021-02-22 NOTE — Plan of Care (Signed)

## 2021-02-22 NOTE — Consult Note (Signed)
   [facility] CM Inpatient Consult   02/22/2021  [name] May 20, 1955 [id number]  [employer] [ACO] Patient: [employer] Medicare  Primary Care Provider:  [name], MD, [facility]   Patient screened for hospitalization with noted high risk score for unplanned readmission risk and to assess for potential [facility] Management service needs for post hospital transition.  Review of patient's medical record reveals patient is currently on airborne precautions.   Plan:  Continue to follow progress and disposition to assess for post hospital care management needs.    For questions contact:   Natividad Brood, RN BSN [facility] Liaison  [phone] business mobile phone Toll free office [phone]  Fax number: [phone] [email or web address]

## 2021-02-22 NOTE — Progress Notes (Addendum)
Inpatient Diabetes Program Recommendations  AACE/ADA: New Consensus Statement on Inpatient Glycemic Control   Target Ranges:  Prepandial:   less than 140 mg/dL      Peak postprandial:   less than 180 mg/dL (1-2 hours)      Critically ill patients:  140 - 180 mg/dL    Latest Reference Range & Units 02/21/21 06:58 02/21/21 12:18 02/21/21 17:52 02/21/21 21:50 02/22/21 07:09  Glucose-Capillary 70 - 99 mg/dL 208 (H)  Novolog 5 units  230 (H)  Novolog 8 units 333 (H)  Novolog 14 units 302 (H)  Novolog 11 units 165 (H)  Novolog 6 units  Semglee 38 units   Review of Glycemic Control  Diabetes history: DM2 Outpatient Diabetes medications: Lantus 38 units QHS, Humalog 12 units BID with meals, Jardiance 10 mg daily Current orders for Inpatient glycemic control: Semglee 38 units daily, Novolog 0-15 units AC&HS, Novolog 3 units TID with meals; Prednisone 50 mg QAM   Inpatient Diabetes Program Recommendations:     Insulin: Per chart, patient did NOT receive any Semglee on 02/21/21. Semglee 38 units already given this morning. Would not recommend any changes with insulin regimen at this time.  Thanks, Barnie Alderman, RN, MSN, CDE Diabetes Coordinator Inpatient Diabetes Program [phone] (Team Pager from 8am to 5pm)

## 2021-02-22 NOTE — Progress Notes (Signed)
DISCHARGE NOTE HOME CRIS GIBBY to be discharged Home per MD order. Discussed prescriptions and follow up appointments with the patient. Prescriptions given to patient; medication list explained in detail. Patient verbalized understanding.  Skin clean, dry and intact without evidence of skin break down, no evidence of skin tears noted. IV catheter discontinued intact. Site without signs and symptoms of complications. Dressing and pressure applied. Pt denies pain at the site currently. No complaints noted.  Patient free of lines, drains, and wounds.   An After Visit Summary (AVS) was printed and given to the patient. Patient escorted via wheelchair, and discharged home via private auto.  Vira Agar, RN

## 2021-02-22 NOTE — TOC Transition Note (Signed)
Transition of Care [facility]) - CM/SW Discharge Note   Patient Details  Name: [name] MRN: [id number] Date of Birth: 02/18/1956  Transition of Care [facility]) CM/SW Contact:  Tom-Johnson, Renea Ee, RN Phone Number: 02/22/2021, 2:58 PM   Clinical Narrative:   From home with husband. Admitted and treated for Covid. No recommendations noted. Patient has necessary DME's at home. Husband to transport at discharge. No further TOC needs noted.  Final next level of care: Home/Self Care Barriers to Discharge: Barriers Resolved   Patient Goals and CMS Choice Patient states their goals for this hospitalization and ongoing recovery are:: To go home CMS Medicare.gov Compare Post Acute Care list provided to:: Patient Choice offered to / list presented to : NA  Discharge Placement                       Discharge Plan and Services                DME Arranged: N/A DME Agency: NA         HH Agency: NA        Social Determinants of Health (SDOH) Interventions     Readmission Risk Interventions No flowsheet data found.

## 2021-02-22 NOTE — Discharge Summary (Signed)
Physician Discharge Summary  [name] GYF:[id number] DOB: [birth year] DOA: 02/19/2021  PCP: [name], MD  Admit date: 02/19/2021 Discharge date: 02/22/2021  Time spent: 60 minutes  Recommendations for Outpatient Follow-up:  Follow-up PCP in 1 week Continue isolation for COVID-19 for 1 more week till 03/01/2021  Discharge Diagnoses:  Principal Problem:   Acute respiratory failure with hypoxia and hypercapnia (HCC) Active Problems:   Type 2 diabetes mellitus with stage 3b chronic kidney disease, with long-term current use of insulin (HCC)   Essential hypertension   Seizure disorder (HCC)   Coronary artery disease involving native coronary artery of native heart without angina pectoris   Gastroesophageal reflux disease   Chronic kidney disease, stage 3b (HCC)   Acute on chronic systolic CHF (congestive heart failure) ([place])   COVID-19 virus infection   Acute metabolic encephalopathy   Mixed diabetic hyperlipidemia associated with type 2 diabetes mellitus ([place])   Atrial tachycardia ([place])   Discharge Condition: Stable  Diet recommendation: Heart healthy diet/carb modified diet  Filed Weights   02/19/21 1735 02/21/21 0652  Weight: 82.2 kg 82.3 kg    History of present illness:  65 year old female with history of diabetes mellitus type 2, CKD stage IIIb, hypertension, hyperlipidemia, CHF with EF of 60 to 65%, moderate to severe aortic stenosis, moderate mitral valve stenosis, peripheral vascular disease s/p right carotid endarterectomy, CAD s/p CABG, s/p bilateral BKA, seizure disorder mood came to [facility] as transfer from [facility] for COVID-19 infection and hypoxemia.   In the ED patient was given 125 mg Solu-Medrol as well as remdesivir.  Patient was placed on BiPAP due to increased work of breathing, initially received 500 cc of IV fluid in the ED.  She also received 40 mg of Lasix as there was concern for pulmonary edema.   Patient's troponin went up to 811,  BNP [place] Hospital Course:   Acute hypoxemic respiratory failure -Resolved; likely multifactorial from COVID-19 infection as well as HFpEF -Patient briefly required BiPAP as she was given IV fluids, BiPAP was removed after she diuresed well with IV Lasix -Patient started on  Molnupirivir and systemic steroids for COVID-19 infection -Also started on torsemide per cardiology   COVID-19 infection -She has been weaned off oxygen -She received 1 dose of remdesivir at [facility] [place] -Patient started on  Molnupirivir and systemic steroids  -Clinically she has improved; D-dimer is down to 0.55 CRP 3.6 -We  discharge her home  on Molnupirivir for 2 more days and prednisone taper for next 7 days -   Elevated troponin -Patient had elevated troponin of 811, BNP 1478 -She has a history of CAD s/p CABG -Cardiology was consulted, patient is asymptomatic -Troponin is down to 422 -Cardiology feels it is demand ischemia from COVID-19 infection/acute hypoxemic respiratory failure -No further intervention recommended   Diabetes mellitus type 2 -CBG elevated due to prednisone -Prednisone being tapered off over the next 7 days  -Continue home regimen  Coronary artery disease -She has history of CABG x4 in 2008 -Seen by cardiology for elevated troponin as above -No intervention planned -Continue aspirin, Plavix, Coreg, rosuvastatin   Acute on chronic HFpEF -Resolved with IV diuresis with Lasix -Started on p.o. torsemide -Not requiring oxygen   Hypertension -Blood pressure is stable -Continue Coreg   CKD stage IV -Creatinine today improved to 1.94 -At baseline  Procedures:   Consultations: Cardiology  Discharge Exam: Vitals:   02/21/21 2125 02/22/21 0911  BP: (!) 155/78 Marland Kitchen)  148/71  Pulse: 90 96  Resp: 18 17  Temp: 97.9 F (36.6 C) 98.9 F (37.2 C)  SpO2: 94% 94%    General: Appears in no acute distress Cardiovascular: S1-S2, regular, no murmur  auscultated Respiratory: Clear to auscultation bilaterally  Discharge Instructions   Discharge Instructions     Diet - low sodium heart healthy   Complete by: As directed    Discharge instructions   Complete by: As directed    Continue isolation for covid 19 for 7 more days till 03/01/21   Increase activity slowly   Complete by: As directed    No wound care   Complete by: As directed       Allergies as of 02/22/2021       Reactions   Metformin Hcl Nausea And Vomiting   Ace Inhibitors Cough   cough   Actos [pioglitazone] Swelling   Atorvastatin Other (See Comments)   Other reaction(s): Other (See Comments) myalgias   Candesartan Cilexetil Other (See Comments)   Rash "cant recall"    Gabapentin Other (See Comments)   Dizziness and unbalanced   Isosorbide Nitrate Other (See Comments)   HA.   Quinapril Hcl Cough   Tramadol Hcl Other (See Comments)   seziure   Tape Rash   Redness, rash, itchiness Redness, rash, itchiness Redness, rash, itchiness No Plastic tape, paper tape OK Redness, rash, itchiness Redness, rash, itchiness No Plastic tape, paper tape OK        Medication List     TAKE these medications    acetaminophen-codeine 300-30 MG tablet Commonly known as: TYLENOL #3 Take 1 tablet by mouth every 8 (eight) hours as needed for moderate pain.   albuterol 108 (90 Base) MCG/ACT inhaler Commonly known as: VENTOLIN HFA Inhale 1-2 puffs into the lungs every 6 (six) hours as needed for wheezing or shortness of breath.   ALPRAZolam 0.5 MG tablet Commonly known as: XANAX Take 0.5 mg by mouth 2 (two) times daily as needed for anxiety.   aspirin 81 MG tablet Take 1 tablet (81 mg total) by mouth daily.   carvedilol 6.25 MG tablet Commonly known as: COREG Take 1 tablet (6.25 mg total) by mouth 2 (two) times daily.   clopidogrel 75 MG tablet Commonly known as: PLAVIX Take 1 tablet (75 mg total) by mouth daily.   cycloSPORINE 0.05 % ophthalmic  emulsion Commonly known as: RESTASIS Place 1 drop into both eyes 2 (two) times daily as needed (dry eyes).   empagliflozin 10 MG Tabs tablet Commonly known as: JARDIANCE Take 10 mg by mouth daily.   ezetimibe 10 MG tablet Commonly known as: ZETIA Take 10 mg by mouth daily.   guaiFENesin 600 MG 12 hr tablet Commonly known as: Mucinex Take 1 tablet (600 mg total) by mouth 2 (two) times daily for 5 days.   insulin lispro 100 UNIT/ML injection Commonly known as: HUMALOG Inject 12 Units into the skin 2 (two) times daily with a meal.   Lantus SoloStar 100 UNIT/ML Solostar Pen Generic drug: insulin glargine Inject 38 Units into the skin at bedtime.   Levetiracetam 750 MG Tb24 Take 1,500 mg by mouth at bedtime.   lidocaine 5 % Commonly known as: LIDODERM Place 1 patch onto the skin daily as needed (pain).   losartan 50 MG tablet Commonly known as: COZAAR Take 50 mg by mouth daily.   molnupiravir EUA 200 mg Caps capsule Commonly known as: LAGEVRIO Take 4 capsules (800 mg total) by mouth 2 (two)  times daily for 2 days.   multivitamins ther. w/minerals Tabs tablet Take 1 tablet by mouth daily.   nitroGLYCERIN 0.4 MG SL tablet Commonly known as: NITROSTAT Place 0.4 mg under the tongue every 5 (five) minutes as needed for chest pain.   potassium chloride SA 20 MEQ tablet Commonly known as: KLOR-CON M Take 20 mEq by mouth 2 (two) times daily.   predniSONE 10 MG tablet Commonly known as: DELTASONE Prednisone 40 mg po daily x 2 day then Prednisone 30 mg po daily x 2 day then Prednisone 20 mg po daily x 2 day then Prednisone 10 mg daily x 1 day then stop... Start taking on: February 23, 2021   pregabalin 50 MG capsule Commonly known as: LYRICA Take 50 mg by mouth daily as needed (severe pain).   ranolazine 500 MG 12 hr tablet Commonly known as: RANEXA Take 1,000 mg by mouth 2 (two) times daily as needed (chest pain).   rosuvastatin 40 MG tablet Commonly known as:  CRESTOR Take 40 mg by mouth daily.   sertraline 100 MG tablet Commonly known as: ZOLOFT Take 1 tablet (100 mg total) by mouth 2 (two) times daily.   torsemide 20 MG tablet Commonly known as: DEMADEX Take 60 mg by mouth daily as needed (fluid/swelling).       Allergies  Allergen Reactions   Metformin Hcl Nausea And Vomiting   Ace Inhibitors Cough    cough   Actos [Pioglitazone] Swelling   Atorvastatin Other (See Comments)    Other reaction(s): Other (See Comments) myalgias   Candesartan Cilexetil Other (See Comments)    Rash "cant recall"    Gabapentin Other (See Comments)    Dizziness and unbalanced   Isosorbide Nitrate Other (See Comments)    HA.   Quinapril Hcl Cough   Tramadol Hcl Other (See Comments)    seziure   Tape Rash    Redness, rash, itchiness Redness, rash, itchiness Redness, rash, itchiness No Plastic tape, paper tape OK Redness, rash, itchiness Redness, rash, itchiness No Plastic tape, paper tape OK      The results of significant diagnostics from this hospitalization (including imaging, microbiology, ancillary and laboratory) are listed below for reference.    Significant Diagnostic Studies: CT Head Wo Contrast  Result Date: 02/19/2021 CLINICAL DATA:  Mental status change, increasing confusion, EXAM: CT HEAD WITHOUT CONTRAST TECHNIQUE: Contiguous axial images were obtained from the base of the skull through the vertex without intravenous contrast. COMPARISON:  09/02/2013 CT head FINDINGS: Brain: No evidence of acute infarction, hemorrhage, cerebral edema, mass, mass effect, or midline shift. No hydrocephalus or extra-axial fluid collection. Periventricular white matter changes, likely the sequela of chronic small vessel ischemic disease. Vascular: No hyperdense vessel. Skull: Normal. Negative for fracture or focal lesion. Sinuses/Orbits: Mucous retention cyst in the right sphenoid sinus. Mild mucosal thickening in the ethmoid air cells. Status post  bilateral lens replacements. Other: The mastoid air cells are well aerated. IMPRESSION: IMPRESSION No acute intracranial process. No etiology is seen for the patient's altered mental status. Electronically Signed   By: Merilyn Baba M.D.   On: 02/19/2021 18:59   DG Chest Port 1 View  Result Date: 02/20/2021 CLINICAL DATA:  Shortness of breath EXAM: PORTABLE CHEST 1 VIEW COMPARISON:  02/19/2021 FINDINGS: Mild cardiomegaly. Bibasilar opacities, slightly worsened. There are Kerley B lines along the periphery of the right lung. No sizable pleural effusion. IMPRESSION: Mild cardiomegaly with worsening bibasilar opacities, likely pulmonary edema. Electronically Signed   By: Lennette Bihari  Collins [place] M.D.   On: 02/20/2021 01:08   DG Chest Port 1 View  Result Date: 02/19/2021 CLINICAL DATA:  COVID hypoxia EXAM: PORTABLE CHEST 1 VIEW COMPARISON:  11/16/2020 FINDINGS: Post sternotomy changes. Cardiomegaly. No focal opacity, pleural effusion or pneumothorax. No overt pulmonary edema. IMPRESSION: No active disease.  Cardiomegaly Electronically Signed   By: Donavan Foil M.D.   On: 02/19/2021 20:06   ECHOCARDIOGRAM COMPLETE  Result Date: 02/04/2021    ECHOCARDIOGRAM REPORT   Patient Name:   ALANDRA SANDO Date of Exam: 01/30/2021 Medical Rec #:  [id number]        Height:       65.0 in Accession #:    [id number]       Weight:       188.6 lb Date of Birth:  07-28-1955         BSA:          1.929 m Patient Age:    6 years         BP:           105/56 mmHg Patient Gender: F                HR:           106 bpm. Exam Location:  Outpatient Procedure: 2D Echo, Cardiac Doppler and Color Doppler Indications:    I35.0 Nonrheumatic aortic (valve) stenosis  History:        Patient has prior history of Echocardiogram examinations, most                 recent 11/15/2020. CAD, Prior CABG, PAD; Risk                 Factors:Hypertension, Diabetes, CKD stage 3, Non-Smoker and                 Dyslipidemia. Patient denies chest pain, SOB and leg  edema.  Sonographer:    Salvadore Dom RVT, RDCS (AE), RDMS Referring Phys: [place]  Sonographer Comments: Technically difficult study due to poor echo windows and patient is morbidly obese. Image acquisition challenging due to patient body habitus. IV attempted for Definity, unsuccessful. IMPRESSIONS  1. Left ventricular ejection fraction, by estimation, is 60 to 65%. The left ventricle has normal function. The left ventricle has no regional wall motion abnormalities. Left ventricular diastolic parameters are indeterminate.  2. Right ventricular systolic function is normal. The right ventricular size is normal.  3. Left atrial size was mildly dilated.  4. The mitral valve is degenerative. Mild to moderate mitral valve regurgitation. Moderate mitral stenosis. Severe mitral annular calcification.  5. The aortic valve is calcified. There is severe calcifcation of the aortic valve. There is severe thickening of the aortic valve. Aortic valve regurgitation is not visualized. Moderate to severe aortic valve stenosis.  6. The inferior vena cava is normal in size with greater than 50% respiratory variability, suggesting right atrial pressure of 3 mmHg. Comparison(s): Outside echocardiogram September 2022 demonstrated an EF of 30 to 35%. There was thinning and akinesis of the mid anterior, mid anteroseptal, basal anterior and basal anteroseptal segments. There was moderately thickened aortic valve with severe aortic stenosis. There was mild mitral regurgitation.  FINDINGS  Left Ventricle: Left ventricular ejection fraction, by estimation, is 60 to 65%. The left ventricle has normal function. The left ventricle has no regional wall motion abnormalities. The left ventricular internal cavity size was normal in size. There is  no left ventricular hypertrophy. Left  ventricular diastolic parameters are indeterminate. Right Ventricle: The right ventricular size is normal. No increase in right ventricular wall thickness.  Right ventricular systolic function is normal. Left Atrium: Left atrial size was mildly dilated. Right Atrium: Right atrial size was normal in size. Pericardium: There is no evidence of pericardial effusion. Mitral Valve: The mitral valve is degenerative in appearance. There is severe thickening of the mitral valve leaflet(s). There is severe calcification of the mitral valve leaflet(s). Severely decreased mobility of the mitral valve leaflets. Severe mitral  annular calcification. Mild to moderate mitral valve regurgitation. Moderate mitral valve stenosis. MV peak gradient, 18.0 mmHg. The mean mitral valve gradient is 10.5 mmHg. Tricuspid Valve: The tricuspid valve is normal in structure. Tricuspid valve regurgitation is mild . No evidence of tricuspid stenosis. Aortic Valve: The aortic valve is calcified. There is severe calcifcation of the aortic valve. There is severe thickening of the aortic valve. There is severe aortic valve annular calcification. Aortic valve regurgitation is not visualized. Moderate to severe aortic stenosis is present. Aortic valve mean gradient measures 25.0 mmHg. Aortic valve peak gradient measures 43.8 mmHg. Aortic valve area, by VTI measures 0.63 cm. Pulmonic Valve: The pulmonic valve was normal in structure. Pulmonic valve regurgitation is not visualized. No evidence of pulmonic stenosis. Aorta: The aortic root is normal in size and structure. Venous: The inferior vena cava is normal in size with greater than 50% respiratory variability, suggesting right atrial pressure of 3 mmHg. IAS/Shunts: No atrial level shunt detected by color flow Doppler.  LEFT VENTRICLE PLAX 2D LVIDd:         3.95 cm LVIDs:         3.22 cm LV PW:         1.19 cm LV IVS:        0.92 cm LVOT diam:     1.80 cm LV SV:         43 LV SV Index:   22 LVOT Area:     2.54 cm  LEFT ATRIUM             Index        RIGHT ATRIUM           Index LA diam:        3.90 cm 2.02 cm/m   RA Area:     17.30 cm LA Vol (A2C):    57.1 ml 29.59 ml/m  RA Volume:   50.50 ml  26.17 ml/m LA Vol (A4C):   93.7 ml 48.56 ml/m LA Biplane Vol: 78.6 ml 40.74 ml/m  AORTIC VALVE                     PULMONIC VALVE AV Area (Vmax):    0.69 cm      PV Vmax:       1.10 m/s AV Area (Vmean):   0.62 cm      PV Peak grad:  4.8 mmHg AV Area (VTI):     0.63 cm AV Vmax:           331.00 cm/s AV Vmean:          231.333 cm/s AV VTI:            0.678 m AV Peak Grad:      43.8 mmHg AV Mean Grad:      25.0 mmHg LVOT Vmax:         89.50 cm/s LVOT Vmean:        56.800 cm/s  LVOT VTI:          0.168 m LVOT/AV VTI ratio: 0.25  AORTA Ao Root diam: 2.60 cm Ao Asc diam:  3.00 cm Ao Arch diam: 3.0 cm MITRAL VALVE MV Area VTI:  1.03 cm    SHUNTS MV Peak grad: 18.0 mmHg   Systemic VTI:  0.17 m MV Mean grad: 10.5 mmHg   Systemic Diam: 1.80 cm MV Vmax:      2.12 m/s MV Vmean:     151.0 cm/s MR Peak grad: 106.1 mmHg MR Mean grad: 63.0 mmHg MR Vmax:      515.00 cm/s MR Vmean:     376.0 cm/s Jenkins Rouge MD Electronically signed by Jenkins Rouge MD Signature Date/Time: 02/04/2021/8:01:02 AM    Final     Microbiology: Recent Results (from the past 240 hour(s))  Resp Panel by RT-PCR (Flu A&B, Covid)     Status: Abnormal   Collection Time: 02/19/21  5:52 PM   Specimen: Nasopharyngeal(NP) swabs in vial transport medium  Result Value Ref Range Status   SARS Coronavirus 2 by RT PCR POSITIVE (A) NEGATIVE Final    Comment: RESULT CALLED TO, READ BACK BY AND VERIFIED WITH: PATTY DOWD RN 02/19/2021 1902 [place] (NOTE) SARS-CoV-2 target nucleic acids are DETECTED.  The SARS-CoV-2 RNA is generally detectable in upper respiratory specimens during the acute phase of infection. Positive results are indicative of the presence of the identified virus, but do not rule out bacterial infection or co-infection with other pathogens not detected by the test. Clinical correlation with patient history and other diagnostic information is necessary to determine patient infection status. The  expected result is Negative.  Fact Sheet for Patients: EntrepreneurPulse.com.au  Fact Sheet for Healthcare Providers: IncredibleEmployment.be  This test is not yet approved or cleared by the [place] FDA and  has been authorized for detection and/or diagnosis of SARS-CoV-2 by FDA under an Emergency Use Authorization (EUA).  This EUA will remain in effect (meaning this test can be  used) for the duration of  the COVID-19 declaration under Section 564(b)(1) of the Act, 21 U.S.C. section 360bbb-3(b)(1), unless the authorization is terminated or revoked sooner.     Influenza A by PCR NEGATIVE NEGATIVE Final   Influenza B by PCR NEGATIVE NEGATIVE Final    Comment: (NOTE) The Xpert Xpress SARS-CoV-2/FLU/RSV plus assay is intended as an aid in the diagnosis of influenza from Nasopharyngeal swab specimens and should not be used as a sole basis for treatment. Nasal washings and aspirates are unacceptable for Xpert Xpress SARS-CoV-2/FLU/RSV testing.  Fact Sheet for Patients: EntrepreneurPulse.com.au  Fact Sheet for Healthcare Providers: IncredibleEmployment.be  This test is not yet approved or cleared by the [place] FDA and has been authorized for detection and/or diagnosis of SARS-CoV-2 by FDA under an Emergency Use Authorization (EUA). This EUA will remain in effect (meaning this test can be used) for the duration of the COVID-19 declaration under Section 564(b)(1) of the Act, 21 U.S.C. section 360bbb-3(b)(1), unless the authorization is terminated or revoked.  Performed at [facility], [address], [place], [place] [address]   Culture, blood (routine x 2)     Status: None (Preliminary result)   Collection Time: 02/19/21  6:43 PM   Specimen: BLOOD  Result Value Ref Range Status   Specimen Description   Final    BLOOD RIGHT ANTECUBITAL Performed at [facility], [address] [place], [place] [address]    Special Requests   Final  BOTTLES DRAWN AEROBIC AND ANAEROBIC Blood Culture results may not be optimal due to an excessive volume of blood received in culture bottles Performed at [facility], [address] [place], [place] [address]    Culture   Final    NO GROWTH 3 DAYS Performed at [facility], [place] [address] [place], [place] [address]    Report Status PENDING  Incomplete     Labs: Basic Metabolic Panel: Recent Labs  Lab 02/19/21 1752 02/20/21 0137 02/20/21 0740 02/20/21 1318 02/21/21 0054 02/22/21 0240  NA 140 141 140  --  133* 136  K 4.0 4.4 3.9  --  3.9 4.0  CL 100  --  101  --  96* 97*  CO2 27  --  24  --  24 27  GLUCOSE 165*  --  225*  --  328* 239*  BUN 49*  --  56*  --  63* 57*  CREATININE 1.98*  --  2.27*  --  2.50* 1.94*  CALCIUM 9.3  --  8.6*  --  8.5* 8.5*  MG  --   --   --  2.2 2.2 2.2   Liver Function Tests: Recent Labs  Lab 02/20/21 0740 02/21/21 0054 02/22/21 0240  AST 33 30 29  ALT 31 29 22   ALKPHOS 59 55 50  BILITOT 1.2 0.4 0.7  PROT 7.1 6.7 6.8  ALBUMIN 3.3* 3.2* 3.3*   No results for input(s): LIPASE, AMYLASE in the last 168 hours. No results for input(s): AMMONIA in the last 168 hours. CBC: Recent Labs  Lab 02/19/21 1752 02/20/21 0137 02/20/21 0740 02/21/21 0054 02/22/21 0240  WBC 6.2  --  4.1 5.4 6.2  NEUTROABS  --   --  3.6 4.7 5.2  HGB 8.8* 10.5* 8.9* 8.9* 9.3*  HCT 28.2* 31.0* 28.6* 27.9* 28.5*  MCV 89.8  --  91.7 88.9 88.2  PLT 141*  --  148* 161 163   Cardiac Enzymes: No results for input(s): CKTOTAL, CKMB, CKMBINDEX, TROPONINI in the last 168 hours. BNP: BNP (last 3 results) Recent Labs    02/20/21 1318  BNP 1,478.8*    ProBNP (last 3 results) No results for input(s): PROBNP in the last 8760 hours.  CBG: Recent Labs  Lab 02/21/21 1218 02/21/21 1752 02/21/21 2150 02/22/21 0709 02/22/21 1222  GLUCAP 230* 333* 302*  165* 208*       Signed:  Oswald Hillock MD.  [facility] 02/22/2021, 1:26 PM

## 2021-02-23 DIAGNOSIS — E1142 Type 2 diabetes mellitus with diabetic polyneuropathy: Secondary | ICD-10-CM | POA: Diagnosis not present

## 2021-02-24 LAB — CULTURE, BLOOD (ROUTINE X 2): Culture: NO GROWTH

## 2021-02-25 LAB — CULTURE, BLOOD (ROUTINE X 2)
Culture: NO GROWTH
Specimen Description: ADEQUATE

## 2021-02-27 DIAGNOSIS — J9601 Acute respiratory failure with hypoxia: Secondary | ICD-10-CM | POA: Diagnosis not present

## 2021-02-27 DIAGNOSIS — E1142 Type 2 diabetes mellitus with diabetic polyneuropathy: Secondary | ICD-10-CM | POA: Diagnosis not present

## 2021-02-27 DIAGNOSIS — R899 Unspecified abnormal finding in specimens from other organs, systems and tissues: Secondary | ICD-10-CM | POA: Diagnosis not present

## 2021-02-27 DIAGNOSIS — L97909 Non-pressure chronic ulcer of unspecified part of unspecified lower leg with unspecified severity: Secondary | ICD-10-CM | POA: Diagnosis not present

## 2021-02-27 DIAGNOSIS — R300 Dysuria: Secondary | ICD-10-CM | POA: Diagnosis not present

## 2021-02-27 DIAGNOSIS — Z09 Encounter for follow-up examination after completed treatment for conditions other than malignant neoplasm: Secondary | ICD-10-CM | POA: Diagnosis not present

## 2021-02-27 DIAGNOSIS — G546 Phantom limb syndrome with pain: Secondary | ICD-10-CM | POA: Diagnosis not present

## 2021-03-11 ENCOUNTER — Other Ambulatory Visit: Payer: Self-pay | Admitting: Neurology

## 2021-03-22 DIAGNOSIS — R410 Disorientation, unspecified: Secondary | ICD-10-CM | POA: Diagnosis not present

## 2021-03-26 DIAGNOSIS — E1142 Type 2 diabetes mellitus with diabetic polyneuropathy: Secondary | ICD-10-CM | POA: Diagnosis not present

## 2021-04-04 ENCOUNTER — Other Ambulatory Visit: Payer: Self-pay | Admitting: Neurology

## 2021-04-06 ENCOUNTER — Other Ambulatory Visit: Payer: Self-pay | Admitting: Neurology

## 2021-04-08 ENCOUNTER — Telehealth: Payer: Self-pay | Admitting: Neurology

## 2021-04-08 MED ORDER — LEVETIRACETAM ER 750 MG PO TB24: 1500.0000 mg | ORAL_TABLET | Freq: Every day | ORAL | 8 refills | Status: DC

## 2021-04-08 NOTE — Telephone Encounter (Signed)
Ok to send refills until her f/u, she was late for appt. Thanks

## 2021-04-08 NOTE — Telephone Encounter (Signed)
1. Which medications need refilled? (List name and dosage, if known) Keppra  2. Which pharmacy/location is medication to be sent to? (include street and city if local pharmacy) [facility] [place]  Pt scheduled for the next available afternoon appointment on 01/07/22

## 2021-04-08 NOTE — Telephone Encounter (Signed)
LMOVM for pt, Refills sent please keep f/u appt to get further refills.

## 2021-04-17 DIAGNOSIS — R8281 Pyuria: Secondary | ICD-10-CM | POA: Diagnosis not present

## 2021-04-17 DIAGNOSIS — N184 Chronic kidney disease, stage 4 (severe): Secondary | ICD-10-CM | POA: Diagnosis not present

## 2021-04-17 DIAGNOSIS — I129 Hypertensive chronic kidney disease with stage 1 through stage 4 chronic kidney disease, or unspecified chronic kidney disease: Secondary | ICD-10-CM | POA: Diagnosis not present

## 2021-04-17 DIAGNOSIS — R809 Proteinuria, unspecified: Secondary | ICD-10-CM | POA: Diagnosis not present

## 2021-04-19 DIAGNOSIS — E1142 Type 2 diabetes mellitus with diabetic polyneuropathy: Secondary | ICD-10-CM | POA: Diagnosis not present

## 2021-04-21 ENCOUNTER — Emergency Department: Payer: Medicare Other

## 2021-04-21 ENCOUNTER — Inpatient Hospital Stay
Admission: EM | Admit: 2021-04-21 | Discharge: 2021-04-25 | DRG: 329 | Disposition: A | Discharge status: Home Health | Payer: Medicare Other | Attending: Internal Medicine | Admitting: Internal Medicine

## 2021-04-21 DIAGNOSIS — N184 Chronic kidney disease, stage 4 (severe): Secondary | ICD-10-CM | POA: Diagnosis present

## 2021-04-21 DIAGNOSIS — Z7982 Long term (current) use of aspirin: Secondary | ICD-10-CM

## 2021-04-21 DIAGNOSIS — I959 Hypotension, unspecified: Secondary | ICD-10-CM | POA: Diagnosis not present

## 2021-04-21 DIAGNOSIS — Z89511 Acquired absence of right leg below knee: Secondary | ICD-10-CM

## 2021-04-21 DIAGNOSIS — K219 Gastro-esophageal reflux disease without esophagitis: Secondary | ICD-10-CM | POA: Diagnosis not present

## 2021-04-21 DIAGNOSIS — G40209 Localization-related (focal) (partial) symptomatic epilepsy and epileptic syndromes with complex partial seizures, not intractable, without status epilepticus: Secondary | ICD-10-CM | POA: Diagnosis present

## 2021-04-21 DIAGNOSIS — I2510 Atherosclerotic heart disease of native coronary artery without angina pectoris: Secondary | ICD-10-CM | POA: Diagnosis present

## 2021-04-21 DIAGNOSIS — I950 Idiopathic hypotension: Secondary | ICD-10-CM | POA: Diagnosis not present

## 2021-04-21 DIAGNOSIS — F329 Major depressive disorder, single episode, unspecified: Secondary | ICD-10-CM | POA: Diagnosis present

## 2021-04-21 DIAGNOSIS — Z9889 Other specified postprocedural states: Secondary | ICD-10-CM

## 2021-04-21 DIAGNOSIS — R7989 Other specified abnormal findings of blood chemistry: Secondary | ICD-10-CM | POA: Diagnosis present

## 2021-04-21 DIAGNOSIS — Z7902 Long term (current) use of antithrombotics/antiplatelets: Secondary | ICD-10-CM

## 2021-04-21 DIAGNOSIS — E1122 Type 2 diabetes mellitus with diabetic chronic kidney disease: Secondary | ICD-10-CM | POA: Diagnosis not present

## 2021-04-21 DIAGNOSIS — W1830XA Fall on same level, unspecified, initial encounter: Secondary | ICD-10-CM | POA: Diagnosis present

## 2021-04-21 DIAGNOSIS — G9341 Metabolic encephalopathy: Secondary | ICD-10-CM | POA: Diagnosis present

## 2021-04-21 DIAGNOSIS — M25551 Pain in right hip: Secondary | ICD-10-CM | POA: Diagnosis not present

## 2021-04-21 DIAGNOSIS — R55 Syncope and collapse: Secondary | ICD-10-CM | POA: Diagnosis present

## 2021-04-21 DIAGNOSIS — N179 Acute kidney failure, unspecified: Secondary | ICD-10-CM | POA: Diagnosis present

## 2021-04-21 DIAGNOSIS — K5289 Other specified noninfective gastroenteritis and colitis: Secondary | ICD-10-CM | POA: Diagnosis not present

## 2021-04-21 DIAGNOSIS — L8990 Pressure ulcer of unspecified site, unspecified stage: Secondary | ICD-10-CM | POA: Diagnosis not present

## 2021-04-21 DIAGNOSIS — K640 First degree hemorrhoids: Secondary | ICD-10-CM | POA: Diagnosis not present

## 2021-04-21 DIAGNOSIS — J341 Cyst and mucocele of nose and nasal sinus: Secondary | ICD-10-CM | POA: Diagnosis not present

## 2021-04-21 DIAGNOSIS — K922 Gastrointestinal hemorrhage, unspecified: Secondary | ICD-10-CM | POA: Diagnosis not present

## 2021-04-21 DIAGNOSIS — I420 Dilated cardiomyopathy: Secondary | ICD-10-CM | POA: Diagnosis not present

## 2021-04-21 DIAGNOSIS — Z8249 Family history of ischemic heart disease and other diseases of the circulatory system: Secondary | ICD-10-CM

## 2021-04-21 DIAGNOSIS — D126 Benign neoplasm of colon, unspecified: Secondary | ICD-10-CM | POA: Diagnosis not present

## 2021-04-21 DIAGNOSIS — K5730 Diverticulosis of large intestine without perforation or abscess without bleeding: Secondary | ICD-10-CM | POA: Diagnosis not present

## 2021-04-21 DIAGNOSIS — I248 Other forms of acute ischemic heart disease: Secondary | ICD-10-CM | POA: Diagnosis not present

## 2021-04-21 DIAGNOSIS — D649 Anemia, unspecified: Secondary | ICD-10-CM | POA: Diagnosis not present

## 2021-04-21 DIAGNOSIS — I130 Hypertensive heart and chronic kidney disease with heart failure and stage 1 through stage 4 chronic kidney disease, or unspecified chronic kidney disease: Secondary | ICD-10-CM | POA: Diagnosis not present

## 2021-04-21 DIAGNOSIS — D123 Benign neoplasm of transverse colon: Secondary | ICD-10-CM | POA: Diagnosis not present

## 2021-04-21 DIAGNOSIS — Z881 Allergy status to other antibiotic agents status: Secondary | ICD-10-CM

## 2021-04-21 DIAGNOSIS — I5020 Unspecified systolic (congestive) heart failure: Secondary | ICD-10-CM | POA: Diagnosis not present

## 2021-04-21 DIAGNOSIS — W19XXXA Unspecified fall, initial encounter: Secondary | ICD-10-CM

## 2021-04-21 DIAGNOSIS — I255 Ischemic cardiomyopathy: Secondary | ICD-10-CM | POA: Diagnosis present

## 2021-04-21 DIAGNOSIS — Z83438 Family history of other disorder of lipoprotein metabolism and other lipidemia: Secondary | ICD-10-CM

## 2021-04-21 DIAGNOSIS — I739 Peripheral vascular disease, unspecified: Secondary | ICD-10-CM | POA: Diagnosis present

## 2021-04-21 DIAGNOSIS — I10 Essential (primary) hypertension: Secondary | ICD-10-CM | POA: Diagnosis not present

## 2021-04-21 DIAGNOSIS — Z888 Allergy status to other drugs, medicaments and biological substances status: Secondary | ICD-10-CM

## 2021-04-21 DIAGNOSIS — I214 Non-ST elevation (NSTEMI) myocardial infarction: Secondary | ICD-10-CM

## 2021-04-21 DIAGNOSIS — Z79899 Other long term (current) drug therapy: Secondary | ICD-10-CM | POA: Diagnosis not present

## 2021-04-21 DIAGNOSIS — F411 Generalized anxiety disorder: Secondary | ICD-10-CM | POA: Diagnosis present

## 2021-04-21 DIAGNOSIS — K529 Noninfective gastroenteritis and colitis, unspecified: Secondary | ICD-10-CM | POA: Diagnosis not present

## 2021-04-21 DIAGNOSIS — K5731 Diverticulosis of large intestine without perforation or abscess with bleeding: Secondary | ICD-10-CM | POA: Diagnosis not present

## 2021-04-21 DIAGNOSIS — K921 Melena: Secondary | ICD-10-CM | POA: Diagnosis not present

## 2021-04-21 DIAGNOSIS — K648 Other hemorrhoids: Secondary | ICD-10-CM | POA: Diagnosis not present

## 2021-04-21 DIAGNOSIS — R079 Chest pain, unspecified: Secondary | ICD-10-CM | POA: Diagnosis not present

## 2021-04-21 DIAGNOSIS — Z89512 Acquired absence of left leg below knee: Secondary | ICD-10-CM

## 2021-04-21 DIAGNOSIS — I5032 Chronic diastolic (congestive) heart failure: Secondary | ICD-10-CM

## 2021-04-21 DIAGNOSIS — G894 Chronic pain syndrome: Secondary | ICD-10-CM | POA: Diagnosis present

## 2021-04-21 DIAGNOSIS — Z7984 Long term (current) use of oral hypoglycemic drugs: Secondary | ICD-10-CM

## 2021-04-21 DIAGNOSIS — D62 Acute posthemorrhagic anemia: Secondary | ICD-10-CM | POA: Diagnosis present

## 2021-04-21 DIAGNOSIS — I672 Cerebral atherosclerosis: Secondary | ICD-10-CM | POA: Diagnosis not present

## 2021-04-21 DIAGNOSIS — K635 Polyp of colon: Secondary | ICD-10-CM | POA: Diagnosis present

## 2021-04-21 DIAGNOSIS — Z794 Long term (current) use of insulin: Secondary | ICD-10-CM

## 2021-04-21 DIAGNOSIS — Z833 Family history of diabetes mellitus: Secondary | ICD-10-CM

## 2021-04-21 DIAGNOSIS — E1151 Type 2 diabetes mellitus with diabetic peripheral angiopathy without gangrene: Secondary | ICD-10-CM | POA: Diagnosis present

## 2021-04-21 DIAGNOSIS — Y92009 Unspecified place in unspecified non-institutional (private) residence as the place of occurrence of the external cause: Secondary | ICD-10-CM

## 2021-04-21 DIAGNOSIS — E1165 Type 2 diabetes mellitus with hyperglycemia: Secondary | ICD-10-CM | POA: Diagnosis not present

## 2021-04-21 DIAGNOSIS — I350 Nonrheumatic aortic (valve) stenosis: Secondary | ICD-10-CM | POA: Diagnosis not present

## 2021-04-21 DIAGNOSIS — E785 Hyperlipidemia, unspecified: Secondary | ICD-10-CM | POA: Diagnosis present

## 2021-04-21 DIAGNOSIS — Z951 Presence of aortocoronary bypass graft: Secondary | ICD-10-CM | POA: Diagnosis not present

## 2021-04-21 DIAGNOSIS — N189 Chronic kidney disease, unspecified: Secondary | ICD-10-CM | POA: Diagnosis not present

## 2021-04-21 DIAGNOSIS — S32591A Other specified fracture of right pubis, initial encounter for closed fracture: Secondary | ICD-10-CM | POA: Diagnosis not present

## 2021-04-21 DIAGNOSIS — L89893 Pressure ulcer of other site, stage 3: Secondary | ICD-10-CM | POA: Diagnosis present

## 2021-04-21 DIAGNOSIS — I5033 Acute on chronic diastolic (congestive) heart failure: Secondary | ICD-10-CM | POA: Diagnosis present

## 2021-04-21 DIAGNOSIS — J9601 Acute respiratory failure with hypoxia: Secondary | ICD-10-CM | POA: Diagnosis present

## 2021-04-21 DIAGNOSIS — R410 Disorientation, unspecified: Secondary | ICD-10-CM | POA: Diagnosis not present

## 2021-04-21 DIAGNOSIS — Z981 Arthrodesis status: Secondary | ICD-10-CM

## 2021-04-21 DIAGNOSIS — Z820 Family history of epilepsy and other diseases of the nervous system: Secondary | ICD-10-CM

## 2021-04-21 DIAGNOSIS — Z20822 Contact with and (suspected) exposure to covid-19: Secondary | ICD-10-CM | POA: Diagnosis present

## 2021-04-21 DIAGNOSIS — D122 Benign neoplasm of ascending colon: Secondary | ICD-10-CM | POA: Diagnosis not present

## 2021-04-21 LAB — CBC WITH DIFFERENTIAL/PLATELET
Abs Immature Granulocytes: 0.1 10*3/uL — ABNORMAL HIGH (ref 0.00–0.07)
Basophils Absolute: 0 10*3/uL (ref 0.0–0.1)
Basophils Relative: 0 %
Eosinophils Absolute: 0.3 10*3/uL (ref 0.0–0.5)
Eosinophils Relative: 3 %
HCT: 28.2 % — ABNORMAL LOW (ref 36.0–46.0)
Hemoglobin: 8.4 g/dL — ABNORMAL LOW (ref 12.0–15.0)
Immature Granulocytes: 1 %
Lymphocytes Relative: 8 %
Lymphs Abs: 1 10*3/uL (ref 0.7–4.0)
MCH: 27.1 pg (ref 26.0–34.0)
MCHC: 29.8 g/dL — ABNORMAL LOW (ref 30.0–36.0)
MCV: 91 fL (ref 80.0–100.0)
Monocytes Absolute: 0.5 10*3/uL (ref 0.1–1.0)
Monocytes Relative: 5 %
Neutro Abs: 9.9 10*3/uL — ABNORMAL HIGH (ref 1.7–7.7)
Neutrophils Relative %: 83 %
Platelets: 211 10*3/uL (ref 150–400)
RBC: 3.1 MIL/uL — ABNORMAL LOW (ref 3.87–5.11)
RDW: 17 % — ABNORMAL HIGH (ref 11.5–15.5)
WBC: 11.8 10*3/uL — ABNORMAL HIGH (ref 4.0–10.5)
nRBC: 0 % (ref 0.0–0.2)

## 2021-04-21 LAB — COMPREHENSIVE METABOLIC PANEL
ALT: 29 U/L (ref 0–44)
AST: 48 U/L — ABNORMAL HIGH (ref 15–41)
Albumin: 3.3 g/dL — ABNORMAL LOW (ref 3.5–5.0)
Alkaline Phosphatase: 52 U/L (ref 38–126)
Anion gap: 13 (ref 5–15)
BUN: 49 mg/dL — ABNORMAL HIGH (ref 8–23)
CO2: 25 mmol/L (ref 22–32)
Calcium: 8.5 mg/dL — ABNORMAL LOW (ref 8.9–10.3)
Chloride: 96 mmol/L — ABNORMAL LOW (ref 98–111)
Creatinine, Ser: 2.58 mg/dL — ABNORMAL HIGH (ref 0.44–1.00)
GFR, Estimated: 20 mL/min — ABNORMAL LOW (ref >60)
Glucose, Bld: 316 mg/dL — ABNORMAL HIGH (ref 70–99)
Potassium: 4.7 mmol/L (ref 3.5–5.1)
Sodium: 134 mmol/L — ABNORMAL LOW (ref 135–145)
Total Bilirubin: 0.7 mg/dL (ref 0.3–1.2)
Total Protein: 6.8 g/dL (ref 6.5–8.1)

## 2021-04-21 LAB — TROPONIN I (HIGH SENSITIVITY)
Troponin I (High Sensitivity): 876 ng/L (ref <18)
Troponin I (High Sensitivity): 822 ng/L (ref <18)

## 2021-04-21 LAB — APTT: aPTT: 30 seconds (ref 24–36)

## 2021-04-21 LAB — D-DIMER, QUANTITATIVE: D-Dimer, Quant: 18.5 ug/mL-FEU — ABNORMAL HIGH (ref 0.00–0.50)

## 2021-04-21 LAB — RESP PANEL BY RT-PCR (FLU A&B, COVID) ARPGX2
Influenza A by PCR: NEGATIVE
Influenza B by PCR: NEGATIVE
SARS Coronavirus 2 by RT PCR: NEGATIVE

## 2021-04-21 LAB — PROTIME-INR
INR: 1.2 (ref 0.8–1.2)
Prothrombin Time: 14.9 seconds (ref 11.4–15.2)

## 2021-04-21 LAB — CBG MONITORING, ED: Glucose-Capillary: 284 mg/dL — ABNORMAL HIGH (ref 70–99)

## 2021-04-21 LAB — BRAIN NATRIURETIC PEPTIDE: B Natriuretic Peptide: 583.7 pg/mL — ABNORMAL HIGH (ref 0.0–100.0)

## 2021-04-21 LAB — PROCALCITONIN: Procalcitonin: 0.27 ng/mL

## 2021-04-21 MED ORDER — LEVETIRACETAM ER 500 MG PO TB24
1500.0000 mg | ORAL_TABLET | Freq: Once | ORAL | Status: AC
  Administered 2021-04-21: 1500 mg via ORAL
  Filled 2021-04-21 (×2): qty 3

## 2021-04-21 MED ORDER — CARVEDILOL 6.25 MG PO TABS: 6.2500 mg | ORAL_TABLET | Freq: Two times a day (BID) | ORAL | Status: DC

## 2021-04-21 MED ORDER — INSULIN GLARGINE-YFGN 100 UNIT/ML ~~LOC~~ SOLN
38.0000 [IU] | Freq: Every day | SUBCUTANEOUS | Status: DC
  Filled 2021-04-21: qty 0.38

## 2021-04-21 MED ORDER — LACTATED RINGERS IV BOLUS: 500.0000 mL | Freq: Once | INTRAVENOUS | Status: DC

## 2021-04-21 MED ORDER — LACTATED RINGERS IV SOLN: INTRAVENOUS | Status: DC

## 2021-04-21 MED ORDER — INSULIN ASPART 100 UNIT/ML IJ SOLN: 0.0000 [IU] | Freq: Every day | INTRAMUSCULAR | Status: DC

## 2021-04-21 MED ORDER — NITROGLYCERIN 0.4 MG SL SUBL: 0.4000 mg | SUBLINGUAL_TABLET | SUBLINGUAL | Status: DC | PRN

## 2021-04-21 MED ORDER — HEPARIN (PORCINE) 25000 UT/250ML-% IV SOLN
1350.0000 [IU]/h | INTRAVENOUS | Status: DC
  Administered 2021-04-21: 900 [IU]/h via INTRAVENOUS
  Administered 2021-04-22: 1350 [IU]/h via INTRAVENOUS
  Filled 2021-04-21 (×2): qty 250

## 2021-04-21 MED ORDER — INSULIN GLARGINE-YFGN 100 UNIT/ML ~~LOC~~ SOLN
30.0000 [IU] | Freq: Every day | SUBCUTANEOUS | Status: DC
  Administered 2021-04-21 – 2021-04-24 (×4): 30 [IU] via SUBCUTANEOUS
  Filled 2021-04-21 (×7): qty 0.3

## 2021-04-21 MED ORDER — LACTATED RINGERS IV BOLUS: 1000.0000 mL | Freq: Once | INTRAVENOUS | Status: DC

## 2021-04-21 MED ORDER — LEVETIRACETAM ER 750 MG PO TB24: 1500.0000 mg | ORAL_TABLET | Freq: Every day | ORAL | Status: DC

## 2021-04-21 MED ORDER — HEPARIN SODIUM (PORCINE) 5000 UNIT/ML IJ SOLN
4000.0000 [IU] | Freq: Once | INTRAMUSCULAR | Status: AC
  Administered 2021-04-21: 4000 [IU] via INTRAVENOUS
  Filled 2021-04-21: qty 1

## 2021-04-21 MED ORDER — ONDANSETRON HCL 4 MG/2ML IJ SOLN
4.0000 mg | Freq: Once | INTRAMUSCULAR | Status: AC
  Administered 2021-04-21: 4 mg via INTRAVENOUS
  Filled 2021-04-21: qty 2

## 2021-04-21 MED ORDER — ACETAMINOPHEN 325 MG PO TABS
650.0000 mg | ORAL_TABLET | ORAL | Status: DC | PRN
  Administered 2021-04-22 – 2021-04-25 (×9): 650 mg via ORAL
  Filled 2021-04-21 (×9): qty 2

## 2021-04-21 MED ORDER — LOSARTAN POTASSIUM 50 MG PO TABS: 50.0000 mg | ORAL_TABLET | Freq: Every day | ORAL | Status: DC

## 2021-04-21 MED ORDER — ACETAMINOPHEN 500 MG PO TABS
1000.0000 mg | ORAL_TABLET | Freq: Once | ORAL | Status: AC
  Administered 2021-04-21: 1000 mg via ORAL
  Filled 2021-04-21: qty 2

## 2021-04-21 MED ORDER — ASPIRIN EC 81 MG PO TBEC
81.0000 mg | DELAYED_RELEASE_TABLET | Freq: Every day | ORAL | Status: DC
  Administered 2021-04-22: 81 mg via ORAL
  Filled 2021-04-21 (×2): qty 1

## 2021-04-21 MED ORDER — ROSUVASTATIN CALCIUM 10 MG PO TABS
40.0000 mg | ORAL_TABLET | Freq: Every day | ORAL | Status: DC
  Administered 2021-04-21 – 2021-04-25 (×5): 40 mg via ORAL
  Filled 2021-04-21 (×2): qty 4
  Filled 2021-04-21: qty 2
  Filled 2021-04-21: qty 4
  Filled 2021-04-21: qty 2
  Filled 2021-04-21: qty 4

## 2021-04-21 MED ORDER — ONDANSETRON HCL 4 MG/2ML IJ SOLN: 4.0000 mg | Freq: Four times a day (QID) | INTRAMUSCULAR | Status: DC | PRN

## 2021-04-21 MED ORDER — LACTATED RINGERS IV BOLUS
500.0000 mL | Freq: Once | INTRAVENOUS | Status: AC
  Administered 2021-04-21: 500 mL via INTRAVENOUS

## 2021-04-21 MED ORDER — EZETIMIBE 10 MG PO TABS
10.0000 mg | ORAL_TABLET | Freq: Every day | ORAL | Status: DC
  Administered 2021-04-22 – 2021-04-25 (×4): 10 mg via ORAL
  Filled 2021-04-21 (×4): qty 1

## 2021-04-21 MED ORDER — INSULIN ASPART 100 UNIT/ML IJ SOLN
0.0000 [IU] | Freq: Three times a day (TID) | INTRAMUSCULAR | Status: DC
  Administered 2021-04-23: 2 [IU] via SUBCUTANEOUS
  Administered 2021-04-23: 3 [IU] via SUBCUTANEOUS
  Administered 2021-04-24: 5 [IU] via SUBCUTANEOUS
  Administered 2021-04-24 – 2021-04-25 (×2): 3 [IU] via SUBCUTANEOUS
  Filled 2021-04-21 (×5): qty 1

## 2021-04-21 NOTE — Assessment & Plan Note (Addendum)
Suspect secondary to hypotension, with decreased perfusion CT head nonacute Sepsis etiology not suspected at this time.   Chest x-ray clear.Follow [facility] Neurologic checks

## 2021-04-21 NOTE — Assessment & Plan Note (Signed)
Management as above under NSTEMI

## 2021-04-21 NOTE — Assessment & Plan Note (Signed)
Gentle IV hydration Monitor renal function and avoid nephrotoxins

## 2021-04-21 NOTE — Assessment & Plan Note (Signed)
Continue aspirin and statins

## 2021-04-21 NOTE — Assessment & Plan Note (Signed)
Continue aspirin and statins.  Patient is s/p BKA

## 2021-04-21 NOTE — Assessment & Plan Note (Addendum)
Troponin [id number], likely demand ischemia from syncopal episode/hypotension/low volume state Patient denies chest pain and EKG nonacute We will continue heparin infusion from the ED Continue aspirin and statin.  Hold carvedilol due to hypotension Cardiology consult Repeat echocardiogram to evaluate for wall motion abnormality

## 2021-04-21 NOTE — ED Notes (Signed)
Dr. Tamala Julian notified in person of pts troponin of 876.

## 2021-04-21 NOTE — Assessment & Plan Note (Addendum)
Painless bloody diarrhea x1 episode on 0/0/7121 Etiology uncertain: Patient states she had hemorrhoids. ?  Low flow state/ischemia Hemoglobin 8.4, down from 9.3.  Baseline 8.9-9.3 Slight worsening from baseline Continue to trend and transfuse if necessary Consider GI/vascular consult

## 2021-04-21 NOTE — Assessment & Plan Note (Signed)
Continue Lyrica. 

## 2021-04-21 NOTE — Consult Note (Signed)
ANTICOAGULATION CONSULT NOTE - Initial Consult  Pharmacy Consult for Heparin gtt Indication: chest pain/ACS  Allergies  Allergen Reactions   Metformin Hcl Nausea And Vomiting   Ace Inhibitors Cough    cough   Actos [Pioglitazone] Swelling   Atorvastatin Other (See Comments)    Other reaction(s): Other (See Comments) myalgias   Candesartan Cilexetil Other (See Comments)    Rash "cant recall"    Gabapentin Other (See Comments)    Dizziness and unbalanced   Isosorbide Nitrate Other (See Comments)    HA.   Quinapril Hcl Cough   Tramadol Hcl Other (See Comments)    seziure   Tape Rash    Redness, rash, itchiness Redness, rash, itchiness Redness, rash, itchiness No Plastic tape, paper tape OK Redness, rash, itchiness Redness, rash, itchiness No Plastic tape, paper tape OK    Patient Measurements: Height: 5\' 5"  (165.1 cm) Weight: 86.2 kg (190 lb) IBW/kg (Calculated) : 57 Heparin Dosing Weight: 75.7kg  Vital Signs: Temp: 98.4 F (36.9 C) (02/05 1724) Temp Source: Oral (02/05 1724) BP: 90/48 (02/05 1844) Pulse Rate: 88 (02/05 1844)  Labs: Recent Labs    04/21/21 1737 04/21/21 1740  HGB 8.4*  --   HCT 28.2*  --   PLT 211  --   APTT  --  30  LABPROT  --  14.9  INR  --  1.2  CREATININE 2.58*  --   TROPONINIHS 876*  --     Estimated Creatinine Clearance: 24.2 mL/min (A) (by C-G formula based on SCr of 2.58 mg/dL (H)).   Medical History: Past Medical History:  Diagnosis Date   Acute congestive heart failure 03/29/2018   Acute encephalopathy 09/02/2013   Anemia, normocytic normochromic 03/14/2008   Arthritis    "hands"   Atherosclerosis of native arteries of the extremities with ulceration 07/14/2013   Atherosclerosis of native artery of extremity with intermittent claudication 09/16/2011   CAD (coronary artery disease) of artery bypass graft 03/14/2008   Chronic kidney disease (CKD) 12/26/2015   stage 3 due to type 2 diabetes mellitus   Chronic pain  syndrome 17/51/0258   Complication of anesthesia    pt felt like she had a hard time waking up after surgery in Feb. 2015.   Coronary artery disease involving native coronary artery without angina pectoris 09/25/2015   Essential hypertension 03/14/2008   Gastroesophageal reflux disease with esophagitis 07/09/2016   Generalized anxiety disorder with panic attacks    History of one panic attack   GERD (gastroesophageal reflux disease)    takes Zantac   Hyperlipidemia, unspecified 03/14/2008   Localization-related symptomatic epilepsy and epileptic syndromes with complex partial seizures, not intractable, without status epilepticus 06/27/2014   Major depressive disorder 03/14/2008   Neuropathy    Osteomyelitis of ankle or foot, left, acute 06/17/2013   Peripheral vascular disease    PVD (peripheral vascular disease) 03/25/2012   Type 2 diabetes mellitus with diabetic mononeuropathy, with long-term current use of insulin 03/14/2008    Medications:  Scheduled:   [START ON 04/22/2021] aspirin EC  81 mg Oral Daily   ezetimibe  10 mg Oral Daily   [START ON 04/22/2021] insulin aspart  0-15 Units Subcutaneous TID WC   insulin aspart  0-5 Units Subcutaneous QHS   insulin glargine  38 Units Subcutaneous QHS   Levetiracetam  1,500 mg Oral QHS   rosuvastatin  40 mg Oral Daily    Relevant labs at baseline: Hgb 8.4 Plt 211 aPTT 30 INR 1.2  Assessment:  Patient admitted after syncopal episode. Noted PMH relevant for  HTN, HLD, DM, CKD, severe AS, s/p CABG and bilateral BKA, seizure disorder. Pharmacy consulted to manage heparin drip for ACS. Bolus already ordered by ED provider.  Of note patient complained of "rectal bleeding of red blood with her stools over the past few days or 1 week" on admission   Goal of Therapy:  Heparin level 0.3-0.7 units/ml Monitor platelets by anticoagulation protocol: Yes   Plan:  Give 4000 units bolus x 1 Start heparin infusion at 900 units/hr Check anti-Xa  level in 8 hours and daily while on heparin Continue to monitor H&H and platelets  Tyhesha Dutson Rodriguez-Guzman PharmD, BCPS 04/21/2021 8:07 PM

## 2021-04-21 NOTE — ED Notes (Signed)
Patient resting comfortably on stretcher. RR even and unlabored. Patient denies any needs or complaints at this time. Patient provided with blanket. Son given beverage.

## 2021-04-21 NOTE — Assessment & Plan Note (Signed)
Holding all antihypertensives due to hypotension

## 2021-04-21 NOTE — ED Notes (Signed)
Patient resting comfortably on stretcher. RR even and unlabored. Patient has no needs or complaints at this time. Patient's son at bedside. Updated patient on her test results and answered all questions.

## 2021-04-21 NOTE — Assessment & Plan Note (Addendum)
Suspect multifactorial with differentials include ACS, symptomatic aortic stenosis, hypotension from low volume state, possibly related to medication/diuretic therapy.  Differential also includes seizure, stroke , PE, CHF Neurologic checks Will get EEG, Echo D-dimer is elevated.  Will order V/Q: Continuous cardiac monitoring Treat each possible etiology as outlined below

## 2021-04-21 NOTE — Assessment & Plan Note (Signed)
Currently euvolemic to dry Chest x-ray clear but BNP elevated to the 500s Holding off on torsemide 60, Coreg and losartan due to low blood pressure Monitor closely for fluid overload in view of IV hydration with known severe aortic stenosis

## 2021-04-21 NOTE — Assessment & Plan Note (Addendum)
Suspect symptomatic severe aortic stenosis Gentle hydration with monitoring for symptomatic CHF Review of outpatient records reveals that discussion of TAVR has already been initiated Cardiology consult

## 2021-04-21 NOTE — ED Notes (Signed)
Provider asking about giving patient another fluid bolus. Patient still does not want to do another fluid bolus due to concerns about becoming fluid overloaded. Spoke with admitting provider about possibly giving fluids at a slower rate and she agreed. This nurse spoke with patient and she agreed with this. This nurse stated that to patient that if she began getting shob or having chest pain to make nurse aware.

## 2021-04-21 NOTE — Progress Notes (Signed)
Patient has only been assigned to ICU for 14 minutes at this time, we were trying to find out if there had been a sudden status change in patient.

## 2021-04-21 NOTE — Assessment & Plan Note (Addendum)
Serial H&H and transfuse if necessary Currently on heparin so close monitoring of H&H

## 2021-04-21 NOTE — ED Notes (Signed)
Pt refused to receive 1L IVF due to possible fluid overload, would like to talk to provider.

## 2021-04-21 NOTE — ED Triage Notes (Signed)
Pt comes pov with son. Had a fall this morning and is on thinners. Had episode of sweating, confusion, syncope with son in car. Also has hx of seizures. On keppra. Pt confused upon arrival to room. Hard to direct. Has bilateral below the knee prosthetics. Denies chest pain or SOB. Pupils equal, round, reactive at this time.

## 2021-04-21 NOTE — Assessment & Plan Note (Addendum)
Suspect secondary to acute medical condition Treat acute medical condition PT eval

## 2021-04-21 NOTE — Assessment & Plan Note (Addendum)
Etiology uncertain CXR is clear Follow BNP and D dimer O2 sat 82% on room air while in the ED Supplemental O2 to keep sats over 94% and wean as tolerated

## 2021-04-21 NOTE — ED Provider Notes (Signed)
[facility] Provider Note    Event Date/Time   First MD Initiated Contact with Patient 04/21/21 1717     (approximate)   History   No chief complaint on file.   HPI  [name] is a 66 y.o. female who presents to the ED for evaluation of No chief complaint on file.   Reviewed DC summary from 12/9.  History of HTN, HLD, DM, CKD, severe AS, s/p CABG and bilateral BKA, seizure disorder.  DAPT with aspirin and Plavix.  Keppra for seizure disorder.  She lives at home with her son.  She is typically not ambulatory, reliant upon a wheelchair.  She reports having rectal bleeding of red blood with her stools over the past few days or 1 week.  Denies abdominal pain, emesis or other bleeding diatheses.  She reports a mechanical fall earlier today, and then a syncopal episode that the son witnessed.  When getting up from a recliner, she was holding onto a door handle, but this broke off, causing her to fall down to the side and strike her right hip on the ground.  She had difficulty getting up and so 911 was called for lift assistance.  She denies any head trauma or syncope associated with this event.  After this, this afternoon, she and her son went to [employer] to get some bed linens and she was fine as she used a wheelchair throughout the store.  After transferring back to the car in the parking lot, son witnessed a syncopal episode where her eyes rolled back in her head and she was out for a couple minutes.  No seizure activity.   After she came back to, she was altered and her son drove her straight here.  He reports that she is cleared up now and is closer to baseline and seems okay.  She is reporting mild right hip pain and generalized weakness.  Reports nauseous.  Denies any chest pain.   Physical Exam   Triage Vital Signs: ED Triage Vitals  Enc Vitals Group     BP      Pulse      Resp      Temp      Temp src      SpO2      Weight       Height      Head Circumference      Peak Flow      Pain Score      Pain Loc      Pain Edu?      Excl. in [place]?     Most recent vital signs: Vitals:   04/21/21 1842 04/21/21 1844  BP:  (!) 90/48  Pulse: 89 88  Resp: 18 14  Temp:    SpO2: 97% 98%    General: Awake, no distress.  Pleasant and conversational in full sentences.  Follows commands in all 4. CV:  Good peripheral perfusion. RRR Resp:  Normal effort.  No distress or wheezing Abd:  No distention.  Soft and nontender MSK:  No deformity noted.  S/p bilateral BKA.  Mild tenderness with palpation of right hip, but logrolling without pain. Neuro:  No focal deficits appreciated. Cranial nerves II through XII intact 5/5 strength and sensation in all 4 extremities Other:     ED Results / Procedures / Treatments   Labs (all labs ordered are listed, but only abnormal results are displayed) Labs Reviewed  COMPREHENSIVE METABOLIC PANEL -  Abnormal; Notable for the following components:      Result Value   Sodium 134 (*)    Chloride 96 (*)    Glucose, Bld 316 (*)    BUN 49 (*)    Creatinine, Ser 2.58 (*)    Calcium 8.5 (*)    Albumin 3.3 (*)    AST 48 (*)    GFR, Estimated 20 (*)    All other components within normal limits  CBC WITH DIFFERENTIAL/PLATELET - Abnormal; Notable for the following components:   WBC 11.8 (*)    RBC 3.10 (*)    Hemoglobin 8.4 (*)    HCT 28.2 (*)    MCHC 29.8 (*)    RDW 17.0 (*)    Neutro Abs 9.9 (*)    Abs Immature Granulocytes 0.10 (*)    All other components within normal limits  CBG MONITORING, ED - Abnormal; Notable for the following components:   Glucose-Capillary 284 (*)    All other components within normal limits  TROPONIN I (HIGH SENSITIVITY) - Abnormal; Notable for the following components:   Troponin I (High Sensitivity) 876 (*)    All other components within normal limits  RESP PANEL BY RT-PCR (FLU A&B, COVID) ARPGX2  PROTIME-INR  APTT  URINALYSIS, ROUTINE W REFLEX  MICROSCOPIC  TYPE AND SCREEN  TROPONIN I (HIGH SENSITIVITY)    EKG Sinus rhythm with a rate of 88 bpm.  Normal axis.  Left bundle.  No STEMI per Sgar[place]ossa criteria. Similar to EKG from 2 months ago  RADIOLOGY CXR reviewed by me without evidence of acute cardiopulmonary pathology. CT head reviewed by me without evidence of acute intracranial pathology. Plain film of the pelvis and right hip reviewed by me with possible right superior ramus fracture  Official radiology report(s): DG Chest 2 View  Result Date: 04/21/2021 CLINICAL DATA:  Right hip pain after a fall. Sweating, confusion, syncope EXAM: CHEST - 2 VIEW COMPARISON:  02/20/2021 FINDINGS: Postoperative changes in the mediastinum. Cardiac enlargement. Lungs are clear. No pleural effusions. No pneumothorax. Mediastinal contours appear intact. IMPRESSION: No active cardiopulmonary disease. Electronically Signed   By: Lucienne Capers M.D.   On: 04/21/2021 18:38   CT HEAD WO CONTRAST (5MM)  Result Date: 04/21/2021 CLINICAL DATA:  Syncope versus seizure.  Anticoagulated. EXAM: CT HEAD WITHOUT CONTRAST TECHNIQUE: Contiguous axial images were obtained from the base of the skull through the vertex without intravenous contrast. RADIATION DOSE REDUCTION: This exam was performed according to the departmental dose-optimization program which includes automated exposure control, adjustment of the mA and/or kV according to patient size and/or use of iterative reconstruction technique. COMPARISON:  02/19/2021 FINDINGS: Brain: Generalized atrophy. Chronic small-vessel ischemic changes of the cerebral hemispheric white matter. No sign of acute infarction, mass lesion, hemorrhage, hydrocephalus or extra-axial collection. Vascular: There is atherosclerotic calcification of the major vessels at the base of the brain. Skull: Negative Sinuses/Orbits: Clear except for a retention cyst in the sphenoid sinus. Orbits negative. Other: None IMPRESSION: No acute finding.  Volume loss. Chronic small-vessel ischemic changes of the white matter. Atherosclerotic calcification of the major vessels at the base of the brain. Electronically Signed   By: Nelson Chimes M.D.   On: 04/21/2021 18:03   DG Hip Unilat W or Wo Pelvis 2-3 Views Right  Result Date: 04/21/2021 CLINICAL DATA:  Right hip pain after a fall. EXAM: DG HIP (WITH OR WITHOUT PELVIS) 2-3V RIGHT COMPARISON:  Left femur 08/20/2018 FINDINGS: Degenerative changes in the lower lumbar spine and hips.  Prior postoperative fixation of the left hip with short intramedullary rod and screw fixation. The right hip demonstrates no evidence of acute fracture or dislocation. There is cortical disruption and irregularity along the superior aspect of the right side of the symphysis pubis which possibly represents nondisplaced fracture. Symphysis pubis is not displaced. Visualized sacrum is intact. Vascular calcifications. IMPRESSION: 1. No evidence of acute fracture or displacement of the right hip. 2. Cortical irregularity consistent with nondisplaced fracture of the superior right pubic ramus. 3. Old fracture deformity of the left hip with postoperative fixation. Electronically Signed   By: Lucienne Capers M.D.   On: 04/21/2021 18:41    PROCEDURES and INTERVENTIONS:  .1-3 Lead EKG Interpretation Performed by: Vladimir Crofts, MD Authorized by: Vladimir Crofts, MD     Interpretation: normal     ECG rate:  80   ECG rate assessment: normal     Rhythm: sinus rhythm     Ectopy: none     Conduction: normal   .Critical Care Performed by: Vladimir Crofts, MD Authorized by: Vladimir Crofts, MD   Critical care provider statement:    Critical care time (minutes):  30   Critical care time was exclusive of:  Separately billable procedures and treating other patients   Critical care was necessary to treat or prevent imminent or life-threatening deterioration of the following conditions:  Cardiac failure and circulatory failure   Critical care  was time spent personally by me on the following activities:  Development of treatment plan with patient or surrogate, discussions with consultants, evaluation of patient's response to treatment, examination of patient, ordering and review of laboratory studies, ordering and review of radiographic studies, ordering and performing treatments and interventions, pulse oximetry, re-evaluation of patient's condition and review of old charts  Medications  heparin injection 4,000 Units (has no administration in time range)  ondansetron (ZOFRAN) injection 4 mg (4 mg Intravenous Given 04/21/21 1822)  lactated ringers bolus 500 mL (500 mLs Intravenous New Bag/Given 04/21/21 1822)     IMPRESSION / MDM / [place] / ED COURSE  I reviewed the triage vital signs and the nursing notes.  66 year old female presents to the ED for evaluation after 2 episodes today, 1 a mechanical fall causing a right superior ramus fracture, and the other a syncopal episode concerning for possible symptomatic aortic stenosis versus NSTEMI and requiring medical admission.  She looks clinically well to me without signs of neurologic or vascular deficits.  Some mild tenderness to the right hip, but no other significant signs of trauma.  Blood work shows CKD around her baseline with mild progression.  Glycemia without acidosis.  Normocytic anemia around her baseline and her troponin is quite elevated to 870.  Her EKG is nonischemic with a left bundle that is known.  Considering her syncope today, possibly due to symptomatic aortic stenosis, but regardless I think she needs to be admitted to the hospital for observation and cardiology evaluation.  We will initiate a heparin drip considering her troponinemia with known CAD and consult medicine for admission.  Clinical Course as of 04/21/21 1918  Sun Apr 21, 2021  1911 Reassessed and discussed my concerns with the patient.  We discussed elevated troponin and possible etiologies of  this.  We discussed NSTEMI's, we discussed aortic stenosis.  I recommended medical admission, and while she is initially hesitant, she initially relents and agrees to stay.  We will consult with hospitalist. [DS]    Clinical Course User Index [DS] Vladimir Crofts,  MD     FINAL CLINICAL IMPRESSION(S) / ED DIAGNOSES   Final diagnoses:  Syncope, unspecified syncope type     Rx / DC Orders   ED Discharge Orders     None        Note:  This document was prepared using Dragon voice recognition software and may include unintentional dictation errors.   Vladimir Crofts, MD 04/21/21 Lurena Nida

## 2021-04-21 NOTE — Assessment & Plan Note (Signed)
Continue Keppra.

## 2021-04-21 NOTE — Assessment & Plan Note (Signed)
Pain control PT eval

## 2021-04-21 NOTE — ED Notes (Signed)
At request of pharmacy, patient has been asked to have her son bring in her [place] tomorrow because this facility does not carry this dose. Patient has been informed that she will get a different dose accordingly tonight. Patient verbalized understanding.

## 2021-04-21 NOTE — ED Notes (Signed)
Spoke with charge RN on 2A for nurse handoff information and requested to await report/pt transport for 20 minutes due to complication on floor. Request understood and ED RN updated on wait time.

## 2021-04-21 NOTE — ED Notes (Addendum)
Patient now going to be transferred to [facility]. Attempted to call report but nurse had just stepped away and will call back. It has now been over an hour since first being assigned.

## 2021-04-21 NOTE — ED Notes (Signed)
Patient resting comfortably on stretcher. RR even and unlabored. Patient denies shob or chest pain at this time. Patient's son at bedside.

## 2021-04-21 NOTE — Assessment & Plan Note (Addendum)
Suspect low volume state from diuretics and BP medication.. ? Blood loss anemia Single episode of hematochezia 2 days prior which may be contributory Hold torsemide and carvedilol and losartan Cautious IV hydration to maintain MAP of 65 Serial H&H and transfuse if necessary

## 2021-04-21 NOTE — ED Notes (Signed)
Per Dr. Tamala Julian, give remaining IVF, total to give 1L.

## 2021-04-21 NOTE — Assessment & Plan Note (Signed)
Sliding scale insulin coverage Basal insulin 

## 2021-04-21 NOTE — ED Notes (Signed)
Called [phone] after no return phone call and was told that nurse was ready and placed on hold.

## 2021-04-21 NOTE — ED Notes (Signed)
This nurse has messaged the floor nurse again asking if they are ready for the patient now. Awaiting for response.

## 2021-04-21 NOTE — Assessment & Plan Note (Deleted)
Hemoglobin 8.4, down from 9.3.  Baseline 8.9-9.3 Slight worsening from baseline Continue to trend and transfuse if necessary

## 2021-04-21 NOTE — H&P (Addendum)
History and Physical    Patient: [name]:[id number] DOB: 1955/12/07 DOA: 04/21/2021 DOS: the patient was seen and examined on 04/21/2021 PCP: [name], MD  Patient coming from: Home  Chief Complaint: syncope   HPI: [name] is a 66 y.o. female with medical history significant for PVD s/p bilateral BKA, CAD s/p CABG x4, DM with neuropathy, complex partial seizures on Keppra, HTN, chronic pain syndrome, severe aortic stenosis on echocardiogram in November 2022 with EF 60-65%(improved from prior of 35%) who presents to the ED following a syncopal episode.  Patient had a fall earlier in the day when she tried to get up from a chair by holding onto a doorknob.  She fell backwards breaking of the doorknob.  EMS was called out to the house for lift assist and subsequent to the fall she was in her usual state of health.  Later on that day she went out shopping with her son.  She remained in the wheelchair during the outing but on being helped back into the car she had a syncopal episode in which she became sweaty and confused and then her eyes rolled to the back of her head and she had loss of consciousness for about 2 minutes.  She had no seizure activity or urinary incontinence.  Her son drove her to the emergency room.  Upon arrival to the emergency room she was awake but confused and had gradual improvement in mentation.    Patient has right hip pain Denies chest pain or shortness of breath, fever or chills. Reports an episode of soft stool with blood 2 days prior without abdominal pain, but with nausea no vomiting    ED course: On arrival BP initially 114/53, dropping to as low as 73/48 during time in the ED.  O2 sat initially 98% on room air dropping to as low as 82% improving with 2 L O2 to the high 90s Heart rate 85 to 88%, mild tachypnea to 21.  Afebrile  Blood work: WBC 11.8, hemoglobin 8.4, down from baseline of 9.3 Creatinine 2.58, up from baseline of 1.94 Glucose  316 Troponin 876  EKG, personally viewed and interpreted: Sinus rhythm at 88 with left bundle branch block, old when compared to EKG 02/20/2021  Imaging: Chest x-ray: No active disease CT head no acute finding Pelvic x-ray nondisplaced fracture superior right pubic ramus  Patient given an IV fluid bolus.  Typed and crossed.  Hospitalist consulted for admission.     Review of Systems: As mentioned in the history of present illness. All other systems reviewed and are negative.Limited by confusion Past Medical History:  Diagnosis Date   Acute congestive heart failure 03/29/2018   Acute encephalopathy 09/02/2013   Anemia, normocytic normochromic 03/14/2008   Arthritis    "hands"   Atherosclerosis of native arteries of the extremities with ulceration 07/14/2013   Atherosclerosis of native artery of extremity with intermittent claudication 09/16/2011   CAD (coronary artery disease) of artery bypass graft 03/14/2008   Chronic kidney disease (CKD) 12/26/2015   stage 3 due to type 2 diabetes mellitus   Chronic pain syndrome 81/19/1478   Complication of anesthesia    pt felt like she had a hard time waking up after surgery in Feb. 2015.   Coronary artery disease involving native coronary artery without angina pectoris 09/25/2015   Essential hypertension 03/14/2008   Gastroesophageal reflux disease with esophagitis 07/09/2016   Generalized anxiety disorder with panic attacks    History of one  panic attack   GERD (gastroesophageal reflux disease)    takes Zantac   Hyperlipidemia, unspecified 03/14/2008   Localization-related symptomatic epilepsy and epileptic syndromes with complex partial seizures, not intractable, without status epilepticus 06/27/2014   Major depressive disorder 03/14/2008   Neuropathy    Osteomyelitis of ankle or foot, left, acute 06/17/2013   Peripheral vascular disease    PVD (peripheral vascular disease) 03/25/2012   Type 2 diabetes mellitus with diabetic  mononeuropathy, with long-term current use of insulin 03/14/2008   Past Surgical History:  Procedure Laterality Date   AMPUTATION Left 08/22/2013   Procedure: LEFT BELOW KNEE AMPUTATION;  Surgeon: Newt Minion, MD;  Location: [place];  Service: Orthopedics;  Laterality: Left;   ANKLE FUSION Left 05/11/2013   TIBIOCALCANEAL FUSION    ANKLE FUSION Left 05/12/2013   Procedure: LEFT TIBIOCALCANEAL FUSION;  Surgeon: Newt Minion, MD;  Location: [place];  Service: Orthopedics;  Laterality: Left;  Left Tibiocalcaneal Fusion   APPLICATION OF WOUND VAC  06/17/2013   Procedure: APPLICATION OF WOUND VAC;  Surgeon: Newt Minion, MD;  Location: [place];  Service: Orthopedics;;   BELOW KNEE LEG AMPUTATION Right Jan. 2008   CAROTID ENDARTERECTOMY Right    CATARACT EXTRACTION Left 12/25/14   CHOLECYSTECTOMY     COLONOSCOPY     CORONARY ARTERY BYPASS GRAFT  12/01/2007   "CABG X4" (05/12/2013)   DILATION AND CURETTAGE OF UTERUS     I & D EXTREMITY Left 06/17/2013   Procedure: IRRIGATION AND DEBRIDEMENT EXTREMITY, PLACEMENT ANTIBIOTIC STIMULAN BEADS;  Surgeon: Newt Minion, MD;  Location: [place];  Service: Orthopedics;  Laterality: Left;   INTRAMEDULLARY (IM) NAIL INTERTROCHANTERIC Left 08/20/2018   Procedure: INTRAMEDULLARY (IM) NAIL INTERTROCHANTRIC;  Surgeon: Shona Needles, MD;  Location: [place];  Service: Orthopedics;  Laterality: Left;   IRRIGATION AND DEBRIDEMENT ABSCESS Left 06/17/2013   ANKLE           DR [name]   TOE AMPUTATION Right    "took off a couple toes before the amputation"   TONSILLECTOMY     Social History:  reports that she has never smoked. She has never used smokeless tobacco. She reports that she does not drink alcohol and does not use drugs.  Allergies  Allergen Reactions   Metformin Hcl Nausea And Vomiting   Ace Inhibitors Cough    cough   Actos [Pioglitazone] Swelling   Atorvastatin Other (See Comments)    Other reaction(s): Other (See Comments) myalgias   Candesartan Cilexetil Other  (See Comments)    Rash "cant recall"    Gabapentin Other (See Comments)    Dizziness and unbalanced   Isosorbide Nitrate Other (See Comments)    HA.   Quinapril Hcl Cough   Tramadol Hcl Other (See Comments)    seziure   Tape Rash    Redness, rash, itchiness Redness, rash, itchiness Redness, rash, itchiness No Plastic tape, paper tape OK Redness, rash, itchiness Redness, rash, itchiness No Plastic tape, paper tape OK    Family History  Problem Relation Age of Onset   Cancer Mother    Heart disease Father    Diabetes Father    Hyperlipidemia Father    Hypertension Father    Alzheimer's disease Maternal Grandfather     Prior to Admission medications   Medication Sig Start Date End Date Taking? Authorizing Provider  acetaminophen-codeine (TYLENOL #3) 300-30 MG tablet Take 1 tablet by mouth every 8 (eight) hours as needed for moderate pain. 09/06/20  [provider]  albuterol (VENTOLIN HFA) 108 (90 Base) MCG/ACT inhaler Inhale 1-2 puffs into the lungs every 6 (six) hours as needed for wheezing or shortness of breath.  05/13/17   [provider]  ALPRAZolam Duanne Moron) 0.5 MG tablet Take 0.5 mg by mouth 2 (two) times daily as needed for anxiety. 09/06/19   [provider]  aspirin 81 MG tablet Take 1 tablet (81 mg total) by mouth daily. 05/13/17   Libby Maw, MD  carvedilol (COREG) 6.25 MG tablet Take 1 tablet (6.25 mg total) by mouth 2 (two) times daily. 02/06/21   Minus Breeding, MD  clopidogrel (PLAVIX) 75 MG tablet Take 1 tablet (75 mg total) by mouth daily. 05/13/17   Libby Maw, MD  cycloSPORINE (RESTASIS) 0.05 % ophthalmic emulsion Place 1 drop into both eyes 2 (two) times daily as needed (dry eyes).    [provider]  empagliflozin (JARDIANCE) 10 MG TABS tablet Take 10 mg by mouth daily.    [provider]  ezetimibe (ZETIA) 10 MG tablet Take 10 mg by mouth daily.    [provider]  insulin lispro  (HUMALOG) 100 UNIT/ML injection Inject 12 Units into the skin 2 (two) times daily with a meal. 11/21/20   [provider]  LANTUS SOLOSTAR 100 UNIT/ML Solostar Pen Inject 38 Units into the skin at bedtime. 07/13/18   [provider]  Levetiracetam 750 MG TB24 Take 2 tablets (1,500 mg total) by mouth at bedtime. 04/08/21   Cameron Sprang, MD  lidocaine (LIDODERM) 5 % Place 1 patch onto the skin daily as needed (pain). 07/16/18   [provider]  losartan (COZAAR) 50 MG tablet Take 50 mg by mouth daily.    [provider]  Multiple Vitamins-Minerals (MULTIVITAMINS THER. W/MINERALS) TABS Take 1 tablet by mouth daily.      [provider]  nitroGLYCERIN (NITROSTAT) 0.4 MG SL tablet Place 0.4 mg under the tongue every 5 (five) minutes as needed for chest pain.    [provider]  potassium chloride SA (KLOR-CON) 20 MEQ tablet Take 20 mEq by mouth 2 (two) times daily.    [provider]  predniSONE (DELTASONE) 10 MG tablet Prednisone 40 mg po daily x 2 day then Prednisone 30 mg po daily x 2 day then Prednisone 20 mg po daily x 2 day then Prednisone 10 mg daily x 1 day then stop... 02/23/21   Oswald Hillock, MD  pregabalin (LYRICA) 50 MG capsule Take 50 mg by mouth daily as needed (severe pain).    [provider]  ranolazine (RANEXA) 500 MG 12 hr tablet Take 1,000 mg by mouth 2 (two) times daily as needed (chest pain).    [provider]  rosuvastatin (CRESTOR) 40 MG tablet Take 40 mg by mouth daily.    [provider]  sertraline (ZOLOFT) 100 MG tablet Take 1 tablet (100 mg total) by mouth 2 (two) times daily. 05/13/17   Libby Maw, MD  torsemide (DEMADEX) 20 MG tablet Take 60 mg by mouth daily as needed (fluid/swelling).    [provider]    Physical Exam: Vitals:   04/21/21 1839 04/21/21 1840 04/21/21 1842 04/21/21 1844  BP: (!) 80/51 (!) 73/48  (!) 90/48  Pulse: 68 96 89 88  Resp: 16 12 18 14    Temp:      TempSrc:      SpO2: (!) 82% (!) 88% 97% 98%  Weight:  Height:       Physical Exam Vitals and nursing note reviewed.  Constitutional:      Appearance: Normal appearance. She is obese. She is not ill-appearing.  HENT:     Head: Normocephalic and atraumatic.  Cardiovascular:     Rate and Rhythm: Normal rate and regular rhythm.  Pulmonary:     Effort: Pulmonary effort is normal.     Breath sounds: Normal breath sounds.  Abdominal:     General: Bowel sounds are normal.     Palpations: Abdomen is soft.     Tenderness: There is no abdominal tenderness.  Musculoskeletal:     Cervical back: Normal range of motion and neck supple.  Skin:    General: Skin is warm and dry.  Neurological:     General: No focal deficit present.     Mental Status: She is alert and oriented to person, place, and time.  Psychiatric:        Mood and Affect: Mood normal.        Behavior: Behavior normal.     Data Reviewed: Notes from primary care and specialist visits, past discharge summaries. Prior diagnostic testing as applicable to current admission diagnoses, including echocardiogram Updated medications and problem lists for reconciliation ED course, including vitals, labs, imaging, treatment and response to treatment Triage notes and ED providers notes     Assessment and Plan: * Syncope and collapse- (present on admission) Suspect multifactorial with differentials include ACS, symptomatic aortic stenosis, hypotension from low volume state, possibly related to medication/diuretic therapy.  Differential also includes seizure, stroke , PE, CHF Neurologic checks Will get EEG, Echo D-dimer is elevated.  Will order V/Q: Continuous cardiac monitoring Treat each possible etiology as outlined below  Hypotension Suspect low volume state from diuretics and BP medication.. ? Blood loss anemia Single episode of hematochezia 2 days prior which may be contributory Hold torsemide and  carvedilol and losartan Cautious IV hydration to maintain MAP of 65 Serial H&H and transfuse if necessary   NSTEMI (non-ST elevated myocardial infarction) ([place])  Troponin 562-130, likely demand ischemia from syncopal episode/hypotension/low volume state Patient denies chest pain and EKG nonacute We will continue heparin infusion from the ED Continue aspirin and statin.  Hold carvedilol due to hypotension Cardiology consult Repeat echocardiogram to evaluate for wall motion abnormality  Severe aortic stenosis by prior echocardiogram Suspect symptomatic severe aortic stenosis Gentle hydration with monitoring for symptomatic CHF Review of outpatient records reveals that discussion of TAVR has already been initiated Cardiology consult  S/P CABG x 4 Management as above under NSTEMI  Hematochezia Painless bloody diarrhea x1 episode on 10/21/5782 Etiology uncertain: Patient states she had hemorrhoids. ?  Low flow state/ischemia Hemoglobin 8.4, down from 9.3.  Baseline 8.9-9.3 Slight worsening from baseline Continue to trend and transfuse if necessary Consider GI/vascular consult   Acute kidney injury superimposed on chronic kidney disease IV (HCC) Gentle IV hydration Monitor renal function and avoid nephrotoxins  Acute respiratory failure with hypoxia (HCC) Etiology uncertain CXR is clear Follow BNP and D dimer O2 sat 82% on room air while in the ED Supplemental O2 to keep sats over 94% and wean as tolerated  Closed fracture of pubic ramus, right, initial encounter (HCC) Pain control PT eval  Fall at home, initial encounter Suspect secondary to acute medical condition Treat acute medical condition PT eval  Acute on chronic anemia , possible acute blood loss anemia Serial H&H and transfuse if necessary Currently on heparin so close monitoring of  H&H  Uncontrolled type 2 diabetes mellitus with hyperglycemia, with long-term current use of insulin (HCC) Sliding scale insulin  coverage Basal insulin  Acute metabolic encephalopathy- (present on admission) Suspect secondary to hypotension, with decreased perfusion CT head nonacute Sepsis etiology not suspected at this time.   Chest x-ray clear.Follow UA Neurologic checks    Localization-related symptomatic epilepsy and epileptic syndromes with complex partial seizures, not intractable, without status epilepticus ([place])- (present on admission) Continue Keppra  PVD (peripheral vascular disease) ([place])- (present on admission) Continue aspirin and statins.  Patient is s/p BKA  History of carotid endarterectomy, right Continue aspirin and statins  Chronic diastolic CHF (congestive heart failure) (HCC) Currently euvolemic to dry Chest x-ray clear but BNP elevated to the 500s Holding off on torsemide 60, Coreg and losartan due to low blood pressure Monitor closely for fluid overload in view of IV hydration with known severe aortic stenosis  Chronic pain syndrome- (present on admission) Continue Lyrica  Essential hypertension- (present on admission) Holding all antihypertensives due to hypotension       Advance Care Planning:   Code Status: Full Code full  Consults: cardiology, GI  Family Communication: son at bedside  Severity of Illness: The appropriate patient status for this patient is INPATIENT. Inpatient status is judged to be reasonable and necessary in order to provide the required intensity of service to ensure the patient's safety. The patient's presenting symptoms, physical exam findings, and initial radiographic and laboratory data in the context of their chronic comorbidities is felt to place them at high risk for further clinical deterioration. Furthermore, it is not anticipated that the patient will be medically stable for discharge from the hospital within 2 midnights of admission.   * I certify that at the point of admission it is my clinical judgment that the patient will require  inpatient hospital care spanning beyond 2 midnights from the point of admission due to high intensity of service, high risk for further deterioration and high frequency of surveillance required.*  Author: Athena Masse, MD 04/21/2021 7:59 PM  For on call review [email or web address].

## 2021-04-21 NOTE — ED Notes (Signed)
Informed Dr. Tamala Julian of SBP 73/48. And SPO2 dropped to 80% on RA. This RN placed pt on 2LNC, SPO2 now 100%

## 2021-04-21 NOTE — ED Notes (Signed)
Inpatient nurse is transferring a patient. Will be ready at 2145.

## 2021-04-22 ENCOUNTER — Inpatient Hospital Stay (HOSPITAL_COMMUNITY)
Admit: 2021-04-22 | Discharge: 2021-04-22 | Disposition: A | Discharge status: Home / Self Care | Payer: Medicare Other | Attending: Internal Medicine | Admitting: Internal Medicine

## 2021-04-22 ENCOUNTER — Other Ambulatory Visit: Payer: Self-pay | Admitting: Radiology

## 2021-04-22 ENCOUNTER — Other Ambulatory Visit: Payer: Medicare Other

## 2021-04-22 DIAGNOSIS — R55 Syncope and collapse: Secondary | ICD-10-CM | POA: Diagnosis not present

## 2021-04-22 DIAGNOSIS — Y92009 Unspecified place in unspecified non-institutional (private) residence as the place of occurrence of the external cause: Secondary | ICD-10-CM

## 2021-04-22 DIAGNOSIS — N179 Acute kidney failure, unspecified: Secondary | ICD-10-CM

## 2021-04-22 DIAGNOSIS — N189 Chronic kidney disease, unspecified: Secondary | ICD-10-CM

## 2021-04-22 DIAGNOSIS — Z79899 Other long term (current) drug therapy: Secondary | ICD-10-CM

## 2021-04-22 DIAGNOSIS — I10 Essential (primary) hypertension: Secondary | ICD-10-CM

## 2021-04-22 DIAGNOSIS — W19XXXA Unspecified fall, initial encounter: Secondary | ICD-10-CM

## 2021-04-22 DIAGNOSIS — Z951 Presence of aortocoronary bypass graft: Secondary | ICD-10-CM

## 2021-04-22 DIAGNOSIS — E1165 Type 2 diabetes mellitus with hyperglycemia: Secondary | ICD-10-CM

## 2021-04-22 DIAGNOSIS — I739 Peripheral vascular disease, unspecified: Secondary | ICD-10-CM

## 2021-04-22 DIAGNOSIS — I950 Idiopathic hypotension: Secondary | ICD-10-CM

## 2021-04-22 DIAGNOSIS — Z794 Long term (current) use of insulin: Secondary | ICD-10-CM

## 2021-04-22 DIAGNOSIS — S32591A Other specified fracture of right pubis, initial encounter for closed fracture: Secondary | ICD-10-CM

## 2021-04-22 DIAGNOSIS — I350 Nonrheumatic aortic (valve) stenosis: Secondary | ICD-10-CM

## 2021-04-22 DIAGNOSIS — J9601 Acute respiratory failure with hypoxia: Secondary | ICD-10-CM

## 2021-04-22 DIAGNOSIS — L8990 Pressure ulcer of unspecified site, unspecified stage: Secondary | ICD-10-CM | POA: Insufficient documentation

## 2021-04-22 DIAGNOSIS — I5032 Chronic diastolic (congestive) heart failure: Secondary | ICD-10-CM

## 2021-04-22 DIAGNOSIS — Z9889 Other specified postprocedural states: Secondary | ICD-10-CM

## 2021-04-22 DIAGNOSIS — K921 Melena: Secondary | ICD-10-CM

## 2021-04-22 DIAGNOSIS — I214 Non-ST elevation (NSTEMI) myocardial infarction: Secondary | ICD-10-CM

## 2021-04-22 LAB — URINALYSIS, COMPLETE (UACMP) WITH MICROSCOPIC
Bilirubin Urine: NEGATIVE
Glucose, UA: >1000 mg/dL — AB
Ketones, ur: NEGATIVE mg/dL
Leukocytes,Ua: NEGATIVE
Nitrite: NEGATIVE
Protein, ur: 30 mg/dL — AB
Specific Gravity, Urine: <1.005 — ABNORMAL LOW (ref 1.005–1.030)
Squamous Epithelial / HPF: NONE SEEN (ref 0–5)
pH: 5 (ref 5.0–8.0)

## 2021-04-22 LAB — ECHOCARDIOGRAM COMPLETE
AR max vel: 0.79 cm2
AV Area VTI: 0.91 cm2
AV Area mean vel: 0.93 cm2
AV Mean grad: 24 mmHg
AV Peak grad: 37.9 mmHg
Ao pk vel: 3.08 m/s
Area-P 1/2: 3.77 cm2
Height: 65 in
MV VTI: 1.19 cm2
S' Lateral: 2.85 cm
Weight: 3040 oz

## 2021-04-22 LAB — LACTIC ACID, PLASMA
Lactic Acid, Venous: 0.6 mmol/L (ref 0.5–1.9)
Lactic Acid, Venous: 0.8 mmol/L (ref 0.5–1.9)

## 2021-04-22 LAB — HEMOGLOBIN AND HEMATOCRIT, BLOOD
HCT: 23.8 % — ABNORMAL LOW (ref 36.0–46.0)
HCT: 25.3 % — ABNORMAL LOW (ref 36.0–46.0)
Hemoglobin: 7.5 g/dL — ABNORMAL LOW (ref 12.0–15.0)
Hemoglobin: 7.9 g/dL — ABNORMAL LOW (ref 12.0–15.0)

## 2021-04-22 LAB — MRSA NEXT GEN BY PCR, NASAL: MRSA by PCR Next Gen: NOT DETECTED

## 2021-04-22 LAB — PREPARE RBC (CROSSMATCH)

## 2021-04-22 LAB — HEPARIN LEVEL (UNFRACTIONATED)
Heparin Unfractionated: 0.2 IU/mL — ABNORMAL LOW (ref 0.30–0.70)
Heparin Unfractionated: 0.15 IU/mL — ABNORMAL LOW (ref 0.30–0.70)

## 2021-04-22 LAB — PROCALCITONIN: Procalcitonin: 0.69 ng/mL

## 2021-04-22 MED ORDER — HEPARIN BOLUS VIA INFUSION
2000.0000 [IU] | Freq: Once | INTRAVENOUS | Status: AC
  Administered 2021-04-22: 2000 [IU] via INTRAVENOUS
  Filled 2021-04-22: qty 2000

## 2021-04-22 MED ORDER — ALBUTEROL SULFATE (2.5 MG/3ML) 0.083% IN NEBU: 3.0000 mL | INHALATION_SOLUTION | Freq: Four times a day (QID) | RESPIRATORY_TRACT | Status: DC | PRN

## 2021-04-22 MED ORDER — LEVETIRACETAM ER 500 MG PO TB24
1500.0000 mg | ORAL_TABLET | Freq: Every day | ORAL | Status: DC
  Administered 2021-04-22 – 2021-04-24 (×3): 1500 mg via ORAL
  Filled 2021-04-22 (×5): qty 3

## 2021-04-22 MED ORDER — PERFLUTREN LIPID MICROSPHERE
1.0000 mL | INTRAVENOUS | Status: AC | PRN
  Administered 2021-04-22: 2 mL via INTRAVENOUS
  Filled 2021-04-22: qty 10

## 2021-04-22 MED ORDER — EMPAGLIFLOZIN 10 MG PO TABS
10.0000 mg | ORAL_TABLET | Freq: Every day | ORAL | Status: DC
  Administered 2021-04-22 – 2021-04-25 (×4): 10 mg via ORAL
  Filled 2021-04-22 (×4): qty 1

## 2021-04-22 MED ORDER — ALPRAZOLAM 0.5 MG PO TABS
0.5000 mg | ORAL_TABLET | Freq: Two times a day (BID) | ORAL | Status: DC | PRN
  Administered 2021-04-22 – 2021-04-25 (×5): 0.5 mg via ORAL
  Filled 2021-04-22 (×5): qty 1

## 2021-04-22 MED ORDER — PREGABALIN 50 MG PO CAPS: 50.0000 mg | ORAL_CAPSULE | Freq: Every day | ORAL | Status: DC | PRN

## 2021-04-22 MED ORDER — CLOPIDOGREL BISULFATE 75 MG PO TABS
75.0000 mg | ORAL_TABLET | Freq: Every day | ORAL | Status: DC
  Administered 2021-04-22: 75 mg via ORAL
  Filled 2021-04-22 (×2): qty 1

## 2021-04-22 MED ORDER — HEPARIN BOLUS VIA INFUSION
1100.0000 [IU] | Freq: Once | INTRAVENOUS | Status: AC
  Administered 2021-04-22: 1100 [IU] via INTRAVENOUS
  Filled 2021-04-22: qty 1100

## 2021-04-22 MED ORDER — SERTRALINE HCL 50 MG PO TABS
100.0000 mg | ORAL_TABLET | Freq: Two times a day (BID) | ORAL | Status: DC
  Administered 2021-04-22 – 2021-04-25 (×7): 100 mg via ORAL
  Filled 2021-04-22 (×7): qty 2

## 2021-04-22 MED ORDER — SODIUM CHLORIDE 0.9% IV SOLUTION: Freq: Once | INTRAVENOUS | Status: AC

## 2021-04-22 MED ORDER — LIDOCAINE 5 % EX PTCH
1.0000 | MEDICATED_PATCH | Freq: Every day | CUTANEOUS | Status: DC | PRN
  Administered 2021-04-23: 1 via TRANSDERMAL
  Filled 2021-04-22 (×3): qty 1

## 2021-04-22 MED ORDER — CYCLOSPORINE 0.05 % OP EMUL
1.0000 [drp] | Freq: Two times a day (BID) | OPHTHALMIC | Status: DC | PRN
  Filled 2021-04-22: qty 30

## 2021-04-22 NOTE — Consult Note (Addendum)
[facility] GI Inpatient Consult Note   Kathline Magic, M.D.  Reason for Consult: Bloody diarrhea 3 days ago, drop in Hgb.   Attending Requesting Consult: Judd Gaudier, M.D.  Outpatient Primary Physician: Melinda Crutch, M.D.  History of Present Illness: [name] is a 66 y.o. female with hx of chronic pain syndrome, diastolic CHF, Severe CAD s/p CABG x 4, PVD s/p bilateral BKA, Severe Aortic stenosis, CKD Stage IV, chronic anemia, seizure disorder who was admitted yesterday evening after a witnessed syncopal event with reported LOC per son. Duration of LOC was reported to be between 2 and 5 minutes. Patient was awake upon arriving to the ED but mildly confused. This has cleared up throughout the last several hours.NO c/o chest pain, dyspnea. Patient reported to have an episode of bloody diarrhea about 3 days ago which appeared self-limited and nonrecurrent.Denies nausea, vomiting, hemetemesis, melena or abdominal pain. Patient diagnosed with NSTEMI by elevated Tn but has no apparent anginal symptoms.  Patient last colonoscopy about "25 or 26 years ago" which was normal.  Past Medical History:  Past Medical History:  Diagnosis Date   Acute congestive heart failure 03/29/2018   Acute encephalopathy 09/02/2013   Anemia, normocytic normochromic 03/14/2008   Arthritis    "hands"   Atherosclerosis of native arteries of the extremities with ulceration 07/14/2013   Atherosclerosis of native artery of extremity with intermittent claudication 09/16/2011   CAD (coronary artery disease) of artery bypass graft 03/14/2008   Chronic kidney disease (CKD) 12/26/2015   stage 3 due to type 2 diabetes mellitus   Chronic pain syndrome 28/41/3244   Complication of anesthesia    pt felt like she had a hard time waking up after surgery in Feb. 2015.   Coronary artery disease involving native coronary artery without angina pectoris 09/25/2015   Essential hypertension 03/14/2008    Gastroesophageal reflux disease with esophagitis 07/09/2016   Generalized anxiety disorder with panic attacks    History of one panic attack   GERD (gastroesophageal reflux disease)    takes Zantac   Hyperlipidemia, unspecified 03/14/2008   Localization-related symptomatic epilepsy and epileptic syndromes with complex partial seizures, not intractable, without status epilepticus 06/27/2014   Major depressive disorder 03/14/2008   Neuropathy    Osteomyelitis of ankle or foot, left, acute 06/17/2013   Peripheral vascular disease    PVD (peripheral vascular disease) 03/25/2012   Type 2 diabetes mellitus with diabetic mononeuropathy, with long-term current use of insulin 03/14/2008    Problem List: Patient Active Problem List   Diagnosis Date Noted   Pressure injury of skin 04/22/2021   Syncope    Syncope and collapse 04/21/2021   Fall at home, initial encounter 04/21/2021   Closed fracture of pubic ramus, right, initial encounter ([place]) 04/21/2021   NSTEMI (non-ST elevated myocardial infarction) ([place]) 04/21/2021   Acute kidney injury superimposed on chronic kidney disease IV ([place]) 04/21/2021   Hematochezia 03/19/7251   Acute metabolic encephalopathy 66/44/0347   Mixed diabetic hyperlipidemia associated with type 2 diabetes mellitus ([place]) 02/20/2021   Acute respiratory failure with hypoxia ([place]) 02/20/2021   Atrial tachycardia ([place]) 02/20/2021   COVID-19 virus infection 02/19/2021   MR (congenital mitral regurgitation) 01/17/2021   Severe aortic stenosis by prior echocardiogram 01/17/2021   Chronic kidney disease, stage 3b ([place]) 01/17/2021   Acute on chronic systolic CHF (congestive heart failure) ([place]) 01/17/2021   Generalized anxiety disorder with panic attacks    Acute congestive heart failure 03/29/2018   DOE (  dyspnea on exertion) 03/29/2018   S/P CABG x 4 03/29/2018   S/P below-knee amputation of both lower extremities 03/29/2018   Acute cystitis 10/07/2016   Pain medication  agreement signed 10/06/2016   Gastroesophageal reflux disease 07/09/2016   CKD stage 3 due to type 2 diabetes mellitus 12/26/2015   Rash 12/26/2015   Bruit 10/29/2015   Murmur, cardiac 10/29/2015   Chronic pain syndrome 09/25/2015   Coronary artery disease involving native coronary artery of native heart without angina pectoris 09/25/2015   High risk medication use 09/25/2015   Neuropathy 09/25/2015   Localization-related symptomatic epilepsy and epileptic syndromes with complex partial seizures, not intractable, without status epilepticus ([place]) 06/27/2014   Phantom limb pain ([place]) 06/27/2014   Seizure disorder ([place]) 09/02/2013   Atherosclerosis of native arteries of the extremities with ulceration 07/14/2013   Protein-calorie malnutrition, severe ([place]) 06/20/2013   Osteomyelitis of ankle or foot, left, acute ([place]) 06/17/2013   Charcot foot due to diabetes mellitus ([place]) 05/13/2013   PVD (peripheral vascular disease) ([place]) 03/25/2012   Atherosclerosis of native artery of extremity with intermittent claudication 09/16/2011   Uncontrolled type 2 diabetes mellitus with hyperglycemia, with long-term current use of insulin ([place]) 03/14/2008   Hyperlipidemia, unspecified 03/14/2008   Acute on chronic anemia , possible acute blood loss anemia 03/14/2008   Major depressive disorder 03/14/2008   Essential hypertension 03/14/2008   CAD (coronary artery disease) of artery bypass graft 03/14/2008   Carotid stenosis 03/14/2008   Right-sided carotid artery disease ([place]) 03/14/2008   Bruit 03/14/2008   History of carotid endarterectomy, right 03/14/2008   History of cholecystectomy, laparoscopic 03/14/2008   Hypotension 03/14/2008   Chronic diastolic CHF (congestive heart failure) ([place]) 03/14/2008    Past Surgical History: Past Surgical History:  Procedure Laterality Date   AMPUTATION Left 08/22/2013   Procedure: LEFT BELOW KNEE AMPUTATION;  Surgeon: Newt Minion, MD;  Location: [place];  Service:  Orthopedics;  Laterality: Left;   ANKLE FUSION Left 05/11/2013   TIBIOCALCANEAL FUSION    ANKLE FUSION Left 05/12/2013   Procedure: LEFT TIBIOCALCANEAL FUSION;  Surgeon: Newt Minion, MD;  Location: [place];  Service: Orthopedics;  Laterality: Left;  Left Tibiocalcaneal Fusion   APPLICATION OF WOUND VAC  06/17/2013   Procedure: APPLICATION OF WOUND VAC;  Surgeon: Newt Minion, MD;  Location: [place];  Service: Orthopedics;;   BELOW KNEE LEG AMPUTATION Right Jan. 2008   CAROTID ENDARTERECTOMY Right    CATARACT EXTRACTION Left 12/25/14   CHOLECYSTECTOMY     COLONOSCOPY     CORONARY ARTERY BYPASS GRAFT  12/01/2007   "CABG X4" (05/12/2013)   DILATION AND CURETTAGE OF UTERUS     I & D EXTREMITY Left 06/17/2013   Procedure: IRRIGATION AND DEBRIDEMENT EXTREMITY, PLACEMENT ANTIBIOTIC STIMULAN BEADS;  Surgeon: Newt Minion, MD;  Location: [place];  Service: Orthopedics;  Laterality: Left;   INTRAMEDULLARY (IM) NAIL INTERTROCHANTERIC Left 08/20/2018   Procedure: INTRAMEDULLARY (IM) NAIL INTERTROCHANTRIC;  Surgeon: Shona Needles, MD;  Location: [place];  Service: Orthopedics;  Laterality: Left;   IRRIGATION AND DEBRIDEMENT ABSCESS Left 06/17/2013   ANKLE           DR [name]   TOE AMPUTATION Right    "took off a couple toes before the amputation"   TONSILLECTOMY      Allergies: Allergies  Allergen Reactions   Metformin Hcl Nausea And Vomiting   Ace Inhibitors Cough    cough   Actos [Pioglitazone] Swelling  Atorvastatin Other (See Comments)    Other reaction(s): Other (See Comments) myalgias   Candesartan Cilexetil Other (See Comments)    Rash "cant recall"    Gabapentin Other (See Comments)    Dizziness and unbalanced   Isosorbide Nitrate Other (See Comments)    HA.   Quinapril Hcl Cough   Tramadol Hcl Other (See Comments)    seziure   Tape Rash    Redness, rash, itchiness Redness, rash, itchiness Redness, rash, itchiness No Plastic tape, paper tape OK Redness, rash, itchiness Redness, rash,  itchiness No Plastic tape, paper tape OK    Home Medications: Medications Prior to Admission  Medication Sig Dispense Refill Last Dose   acetaminophen-codeine (TYLENOL #3) 300-30 MG tablet Take 1 tablet by mouth every 8 (eight) hours as needed for moderate pain.      albuterol (VENTOLIN HFA) 108 (90 Base) MCG/ACT inhaler Inhale 1-2 puffs into the lungs every 6 (six) hours as needed for wheezing or shortness of breath.       ALPRAZolam (XANAX) 0.5 MG tablet Take 0.5 mg by mouth 2 (two) times daily as needed for anxiety.      aspirin 81 MG tablet Take 1 tablet (81 mg total) by mouth daily. 90 tablet 0    carvedilol (COREG) 6.25 MG tablet Take 1 tablet (6.25 mg total) by mouth 2 (two) times daily. 180 tablet 3    clopidogrel (PLAVIX) 75 MG tablet Take 1 tablet (75 mg total) by mouth daily. 90 tablet 0    cycloSPORINE (RESTASIS) 0.05 % ophthalmic emulsion Place 1 drop into both eyes 2 (two) times daily as needed (dry eyes).      empagliflozin (JARDIANCE) 10 MG TABS tablet Take 10 mg by mouth daily.      ezetimibe (ZETIA) 10 MG tablet Take 10 mg by mouth daily.      insulin lispro (HUMALOG) 100 UNIT/ML injection Inject 12 Units into the skin 2 (two) times daily with a meal.      LANTUS SOLOSTAR 100 UNIT/ML Solostar Pen Inject 38 Units into the skin at bedtime.      Levetiracetam 750 MG TB24 Take 2 tablets (1,500 mg total) by mouth at bedtime. 60 tablet 8    lidocaine (LIDODERM) 5 % Place 1 patch onto the skin daily as needed (pain).      losartan (COZAAR) 50 MG tablet Take 50 mg by mouth daily.      Multiple Vitamins-Minerals (MULTIVITAMINS THER. W/MINERALS) TABS Take 1 tablet by mouth daily.        nitroGLYCERIN (NITROSTAT) 0.4 MG SL tablet Place 0.4 mg under the tongue every 5 (five) minutes as needed for chest pain.      potassium chloride SA (KLOR-CON) 20 MEQ tablet Take 20 mEq by mouth 2 (two) times daily.      predniSONE (DELTASONE) 10 MG tablet Prednisone 40 mg po daily x 2 day then  Prednisone 30 mg po daily x 2 day then Prednisone 20 mg po daily x 2 day then Prednisone 10 mg daily x 1 day then stop... 25 tablet 0    pregabalin (LYRICA) 50 MG capsule Take 50 mg by mouth daily as needed (severe pain).      ranolazine (RANEXA) 500 MG 12 hr tablet Take 1,000 mg by mouth 2 (two) times daily as needed (chest pain).      rosuvastatin (CRESTOR) 40 MG tablet Take 40 mg by mouth daily.      sertraline (ZOLOFT) 100 MG tablet Take 1 tablet (  100 mg total) by mouth 2 (two) times daily. 180 tablet 0    torsemide (DEMADEX) 20 MG tablet Take 60 mg by mouth daily as needed (fluid/swelling).      Home medication reconciliation was completed with the patient.   Scheduled Inpatient Medications:    aspirin EC  81 mg Oral Daily   clopidogrel  75 mg Oral Daily   empagliflozin  10 mg Oral Daily   ezetimibe  10 mg Oral Daily   insulin aspart  0-15 Units Subcutaneous TID WC   insulin aspart  0-5 Units Subcutaneous QHS   insulin glargine-yfgn  30 Units Subcutaneous QHS   levETIRAcetam  1,500 mg Oral QHS   rosuvastatin  40 mg Oral Daily   sertraline  100 mg Oral BID    Continuous Inpatient Infusions:    heparin 1,050 Units/hr (04/22/21 0754)    PRN Inpatient Medications:  acetaminophen, albuterol, ALPRAZolam, cycloSPORINE, lidocaine, nitroGLYCERIN, ondansetron (ZOFRAN) IV, pregabalin  Family History: family history includes Alzheimer's disease in her maternal grandfather; Cancer in her mother; Diabetes in her father; Heart disease in her father; Hyperlipidemia in her father; Hypertension in her father.   GI Family History: Negative  Social History:   reports that she has never smoked. She has never used smokeless tobacco. She reports that she does not drink alcohol and does not use drugs. The patient denies ETOH, tobacco, or drug use.    Review of Systems: Review of Systems - Negative except that in HPI.  Physical Examination: BP (!) 106/55    Pulse 95    Temp 99 F (37.2 C)     Resp 16    Ht 5\' 5"  (1.651 m)    Wt 86.2 kg    SpO2 (!) 88%    BMI 31.62 kg/m  Physical Exam Vitals reviewed.  Constitutional:      General: She is not in acute distress.    Appearance: Normal appearance. She is obese. She is not ill-appearing.  HENT:     Head: Normocephalic and atraumatic.     Nose: Nose normal.  Eyes:     Conjunctiva/sclera: Conjunctivae normal.     Pupils: Pupils are equal, round, and reactive to light.  Cardiovascular:     Rate and Rhythm: Normal rate and regular rhythm.     Pulses: Normal pulses.  Pulmonary:     Effort: Pulmonary effort is normal.  Abdominal:     General: There is no distension.     Palpations: Abdomen is soft.  Musculoskeletal:       Legs:     Comments: Bilateral BKA  Neurological:     Mental Status: She is alert and oriented to person, place, and time.     Sensory: Sensation is intact.     Motor: Motor function is intact.     Coordination: Coordination is intact.    Data: Lab Results  Component Value Date   WBC 11.8 (H) 04/21/2021   HGB 7.9 (L) 04/22/2021   HCT 25.3 (L) 04/22/2021   MCV 91.0 04/21/2021   PLT 211 04/21/2021   Recent Labs  Lab 04/21/21 1737 04/22/21 0312 04/22/21 0516  HGB 8.4* 7.5* 7.9*   Lab Results  Component Value Date   NA 134 (L) 04/21/2021   K 4.7 04/21/2021   CL 96 (L) 04/21/2021   CO2 25 04/21/2021   BUN 49 (H) 04/21/2021   CREATININE 2.58 (H) 04/21/2021   Lab Results  Component Value Date   ALT 29 04/21/2021  AST 48 (H) 04/21/2021   ALKPHOS 52 04/21/2021   BILITOT 0.7 04/21/2021   Recent Labs  Lab 04/21/21 1740  APTT 30  INR 1.2   CBC Latest Ref Rng & Units 04/22/2021 04/22/2021 04/21/2021  WBC 4.0 - 10.5 K/uL - - 11.8(H)  Hemoglobin 12.0 - 15.0 g/dL 7.9(L) 7.5(L) 8.4(L)  Hematocrit 36.0 - 46.0 % 25.3(L) 23.8(L) 28.2(L)  Platelets 150 - 400 K/uL - - 211    STUDIES: DG Chest 2 View  Result Date: 04/21/2021 CLINICAL DATA:  Right hip pain after a fall. Sweating, confusion, syncope  EXAM: CHEST - 2 VIEW COMPARISON:  02/20/2021 FINDINGS: Postoperative changes in the mediastinum. Cardiac enlargement. Lungs are clear. No pleural effusions. No pneumothorax. Mediastinal contours appear intact. IMPRESSION: No active cardiopulmonary disease. Electronically Signed   By: Lucienne Capers M.D.   On: 04/21/2021 18:38   CT HEAD WO CONTRAST (5MM)  Result Date: 04/21/2021 CLINICAL DATA:  Syncope versus seizure.  Anticoagulated. EXAM: CT HEAD WITHOUT CONTRAST TECHNIQUE: Contiguous axial images were obtained from the base of the skull through the vertex without intravenous contrast. RADIATION DOSE REDUCTION: This exam was performed according to the departmental dose-optimization program which includes automated exposure control, adjustment of the mA and/or kV according to patient size and/or use of iterative reconstruction technique. COMPARISON:  02/19/2021 FINDINGS: Brain: Generalized atrophy. Chronic small-vessel ischemic changes of the cerebral hemispheric white matter. No sign of acute infarction, mass lesion, hemorrhage, hydrocephalus or extra-axial collection. Vascular: There is atherosclerotic calcification of the major vessels at the base of the brain. Skull: Negative Sinuses/Orbits: Clear except for a retention cyst in the sphenoid sinus. Orbits negative. Other: None IMPRESSION: No acute finding. Volume loss. Chronic small-vessel ischemic changes of the white matter. Atherosclerotic calcification of the major vessels at the base of the brain. Electronically Signed   By: Nelson Chimes M.D.   On: 04/21/2021 18:03   ECHOCARDIOGRAM COMPLETE  Result Date: 04/22/2021    ECHOCARDIOGRAM REPORT   Patient Name:   SONDA COPPENS Date of Exam: 04/22/2021 Medical Rec #:  [id number]        Height:       65.0 in Accession #:    [id number]       Weight:       190.0 lb Date of Birth:  16-Oct-1955         BSA:          1.935 m Patient Age:    28 years         BP:           119/69 mmHg Patient Gender: F                 HR:           86 bpm. Exam Location:  [facility] Procedure: 2D Echo, Color Doppler, Cardiac Doppler and Intracardiac            Opacification Agent Indications:     R55 Syncope  History:         Patient has prior history of Echocardiogram examinations, most                  recent 01/30/2021. CHF, CAD, Prior CABG, PVD; Risk                  Factors:Dyslipidemia and Diabetes.  Sonographer:     Charmayne Sheer Referring Phys:  [id number] Athena Masse Diagnosing Phys: Kathlyn Sacramento MD  Sonographer Comments: Suboptimal apical window and  no subcostal window. IMPRESSIONS  1. Left ventricular ejection fraction, by estimation, is 35 to 40%. The left ventricle has moderately decreased function. Left ventricular endocardial border not optimally defined to evaluate regional wall motion. There is mild left ventricular hypertrophy. Left ventricular diastolic parameters are indeterminate.  2. Right ventricular systolic function is normal. The right ventricular size is normal. There is mildly elevated pulmonary artery systolic pressure.  3. Left atrial size was moderately dilated.  4. The mitral valve is normal in structure. No evidence of mitral valve regurgitation. Mild to moderate mitral stenosis. The mean mitral valve gradient is 5.0 mmHg. Severe mitral annular calcification.  5. The aortic valve is calcified. Aortic valve regurgitation is not visualized. Moderate to severe aortic valve stenosis. Aortic valve mean gradient measures 24.0 mmHg. Aortic valve is not well seen. Possible low flow-low gradient severe aortic stenosis. FINDINGS  Left Ventricle: Left ventricular ejection fraction, by estimation, is 35 to 40%. The left ventricle has moderately decreased function. Left ventricular endocardial border not optimally defined to evaluate regional wall motion. Definity contrast agent was given IV to delineate the left ventricular endocardial borders. The left ventricular internal cavity size was normal in size. There is mild left  ventricular hypertrophy. Left ventricular diastolic parameters are indeterminate. Right Ventricle: The right ventricular size is normal. No increase in right ventricular wall thickness. Right ventricular systolic function is normal. There is mildly elevated pulmonary artery systolic pressure. The tricuspid regurgitant velocity is 3.03  m/s, and with an assumed right atrial pressure of 5 mmHg, the estimated right ventricular systolic pressure is 74.2 mmHg. Left Atrium: Left atrial size was moderately dilated. Right Atrium: Right atrial size was normal in size. Pericardium: There is no evidence of pericardial effusion. Mitral Valve: The mitral valve is normal in structure. Severe mitral annular calcification. No evidence of mitral valve regurgitation. Mild to moderate mitral valve stenosis. MV peak gradient, 9.1 mmHg. The mean mitral valve gradient is 5.0 mmHg. Tricuspid Valve: The tricuspid valve is normal in structure. Tricuspid valve regurgitation is mild . No evidence of tricuspid stenosis. Aortic Valve: The aortic valve is calcified. Aortic valve regurgitation is not visualized. Moderate to severe aortic stenosis is present. Aortic valve mean gradient measures 24.0 mmHg. Aortic valve peak gradient measures 37.9 mmHg. Aortic valve area, by VTI measures 0.91 cm. Pulmonic Valve: The pulmonic valve was normal in structure. Pulmonic valve regurgitation is mild. No evidence of pulmonic stenosis. Aorta: The aortic root is normal in size and structure. Venous: The inferior vena cava was not well visualized. IAS/Shunts: No atrial level shunt detected by color flow Doppler.  LEFT VENTRICLE PLAX 2D LVIDd:         3.49 cm   Diastology LVIDs:         2.85 cm   LV e' medial:    3.59 cm/s LV PW:         1.05 cm   LV E/e' medial:  43.7 LV IVS:        0.80 cm   LV e' lateral:   6.09 cm/s LVOT diam:     2.00 cm   LV E/e' lateral: 25.8 LV SV:         47 LV SV Index:   24 LVOT Area:     3.14 cm  LEFT ATRIUM           Index LA  diam:      4.50 cm 2.33 cm/m LA Vol (A4C): 67.7 ml 34.98 ml/m  AORTIC VALVE  PULMONIC VALVE AV Area (Vmax):    0.79 cm      PV Vmax:       1.05 m/s AV Area (Vmean):   0.93 cm      PV Vmean:      69.500 cm/s AV Area (VTI):     0.91 cm      PV VTI:        0.177 m AV Vmax:           308.00 cm/s   PV Peak grad:  4.4 mmHg AV Vmean:          183.200 cm/s  PV Mean grad:  2.0 mmHg AV VTI:            0.516 m AV Peak Grad:      37.9 mmHg AV Mean Grad:      24.0 mmHg LVOT Vmax:         77.70 cm/s LVOT Vmean:        54.400 cm/s LVOT VTI:          0.149 m LVOT/AV VTI ratio: 0.29  AORTA Ao Root diam: 2.30 cm MITRAL VALVE                TRICUSPID VALVE MV Area (PHT): 3.77 cm     TR Peak grad:   36.7 mmHg MV Area VTI:   1.19 cm     TR Vmax:        303.00 cm/s MV Peak grad:  9.1 mmHg MV Mean grad:  5.0 mmHg     SHUNTS MV Vmax:       1.51 m/s     Systemic VTI:  0.15 m MV Vmean:      107.0 cm/s   Systemic Diam: 2.00 cm MV Decel Time: 201 msec MV E velocity: 157.00 cm/s MV A velocity: 140.00 cm/s MV E/A ratio:  1.12 Kathlyn Sacramento MD Electronically signed by Kathlyn Sacramento MD Signature Date/Time: 04/22/2021/10:31:39 AM    Final    DG Hip Unilat W or Wo Pelvis 2-3 Views Right  Result Date: 04/21/2021 CLINICAL DATA:  Right hip pain after a fall. EXAM: DG HIP (WITH OR WITHOUT PELVIS) 2-3V RIGHT COMPARISON:  Left femur 08/20/2018 FINDINGS: Degenerative changes in the lower lumbar spine and hips. Prior postoperative fixation of the left hip with short intramedullary rod and screw fixation. The right hip demonstrates no evidence of acute fracture or dislocation. There is cortical disruption and irregularity along the superior aspect of the right side of the symphysis pubis which possibly represents nondisplaced fracture. Symphysis pubis is not displaced. Visualized sacrum is intact. Vascular calcifications. IMPRESSION: 1. No evidence of acute fracture or displacement of the right hip. 2. Cortical irregularity  consistent with nondisplaced fracture of the superior right pubic ramus. 3. Old fracture deformity of the left hip with postoperative fixation. Electronically Signed   By: Lucienne Capers M.D.   On: 04/21/2021 18:41   @IMAGES @  Assessment: Syncopal episode - Multiple possible causes (cardiac, neurological, polypharmacy). Anemia with drop in Hgb to 7.9 from baseline of around 9.5.  Hgb has dropped in the past to as low as 6.1 (2015).Currently hemodynamically stable. Diastolic CHF. Elevated Troponin. ?Demand ischemia vs. Secondary to CRF. No symptoms of chest pain, EKG changes.  COVID-19 status: Tested negative.  Recommendations:  Continue cardiology recs (IV Heparin, Plavix). I see no reason to hold anticoagulants from a GI standpoint. Will follow. Patient interested in colonoscopy at some point, either inpatient or outpatient. No indication for  urgent endoluminal evaluation at this time.  Advance diet for now.  Thank you for the consult. Please call with questions or concerns.  Olean Ree, "Lanny Hurst MD [facility] [place],  [id number] [phone]  04/22/2021 3:50 PM

## 2021-04-22 NOTE — Progress Notes (Addendum)
Rough day. Patient refused all CBGs and insulin even though she is a bilateral BKA with prosthesis. See Jackelyn Poling Byrd\'s progress note. Patient was rude and hateful to nurses,doctors and nurse techs. 1815 Report called to [place] on 1 C.1830 Patient transported via bed to room 203.

## 2021-04-22 NOTE — Progress Notes (Signed)
*  PRELIMINARY RESULTS* Echocardiogram 2D Echocardiogram has been performed.  [name] Char Tosh Glaze 04/22/2021, 9:42 AM

## 2021-04-22 NOTE — Progress Notes (Signed)
PROGRESS NOTE  [name]    DOB: Feb 08, 1956, 66 y.o.  BZJ:[id number]  PCP: [name], MD   Code Status: Full Code   DOA: 04/21/2021   LOS: 1  Brief Narrative of Current Hospitalization  [name] is a 66 y.o. female with a PMH significant for PVD s/p bilateral BKA, CAD s/p CABG x4, DM with neuropathy, complex partial seizures on Keppra, HTN, chronic pain syndrome, severe aortic stenosis on echocardiogram in November 2022 with EF 60-65%(improved from prior of 35%). They presented from home to the ED on 04/21/2021 with syncopal episode. She endorses having several episodes of hematochezia/bloody tissue after Bms in the past few days.  In the ED, it was found that they had hypotension, hypoxia requiring 2L O2, hgb 8.4, elevated troponin to 876. They were treated with ACS rule out, EEG, echo, PE study.   GI and cardiology were consulted.  Patient was admitted to medicine service for further workup and management of syncope as outlined in detail below.  04/22/21 -patient endorses having epistaxis overnight  Assessment & Plan  Principal Problem:   Syncope and collapse Active Problems:   Uncontrolled type 2 diabetes mellitus with hyperglycemia, with long-term current use of insulin (HCC)   Acute on chronic anemia , possible acute blood loss anemia   Essential hypertension   History of carotid endarterectomy, right   PVD (peripheral vascular disease) (HCC)   Localization-related symptomatic epilepsy and epileptic syndromes with complex partial seizures, not intractable, without status epilepticus ([place])   Chronic pain syndrome   High risk medication use   S/P CABG x 4   Severe aortic stenosis by prior echocardiogram   Acute metabolic encephalopathy   Acute respiratory failure with hypoxia ([place])   Fall at home, initial encounter   Closed fracture of pubic ramus, right, initial encounter ([place])   NSTEMI (non-ST elevated myocardial infarction) ([place])   Acute kidney injury  superimposed on chronic kidney disease IV (HCC)   Hypotension   Chronic diastolic CHF (congestive heart failure) (HCC)   Hematochezia   Pressure injury of skin  Syncope- most likely differential to me is symptomatic anemia given recent Bms and significant decline in hgb. Last colonoscopy about 20 years ago. Hold off on VQ scan at this time as patient is not experiencing chest pain and is stable ORA.   Symptomatic anemia- significant decline in hgb, maybe partially dilutional with fluids given since admission but also had h/o recent rectal bleeding and no colonoscopy >20 years. Hgb 10.5 prior to admission>>>7.5 this am. Not endorsing any current bleeding but had epistaxis overnight - transfuse 1 unit pRBCs - GI consulted, appreciate recs  Hypotension Suspect low volume state, from diuretics and BP medication.. ? Blood loss anemia Single episode of hematochezia 2 days prior which may be contributory Hold torsemide and carvedilol and losartan Cautious IV hydration to maintain MAP of 65 Serial H&H and transfuse if necessary    NSTEMI (non-ST elevated myocardial infarction) (HCC)   S/P CABG x 4  Troponin 017-510, likely demand ischemia from syncopal episode/hypotension/low volume state Patient denies chest pain and EKG nonacute We will continue heparin infusion from the ED Continue aspirin and statin.  Hold carvedilol due to hypotension Cardiology consult - f/u echo   Severe aortic stenosis by prior echocardiogram- discussion of TAVR has already been initiated outpatient. If low volume, could be exacerbated Cardiology consult    Acute kidney injury superimposed on chronic kidney disease IV ([place]) Gentle IV hydration Monitor renal function  and avoid nephrotoxins   Acute respiratory failure with hypoxia ([place])- resolved. ORA   Closed fracture of pubic ramus, right, initial encounter ([place]) Pain control PT eval   Fall at home, initial encounter Suspect secondary to acute medical  condition Treat acute medical condition PT eval   Uncontrolled type 2 diabetes mellitus with hyperglycemia, with long-term current use of insulin (HCC) Sliding scale insulin coverage Basal insulin   Acute metabolic encephalopathy- (present on admission)- resolved Suspect secondary to hypotension, with decreased perfusion CT head nonacute Sepsis etiology not suspected at this time.   Chest x-ray clear. Neurologic checks     Localization-related symptomatic epilepsy and epileptic syndromes with complex partial seizures, not intractable, without status epilepticus ([place])- (present on admission) Continue Keppra   PVD (peripheral vascular disease) ([place])- (present on admission) Continue aspirin and statins.  Patient is s/p BKA   History of carotid endarterectomy, right Continue aspirin and statins   Chronic diastolic CHF (congestive heart failure) (HCC) Currently euvolemic to dry Chest x-ray clear but BNP elevated to the 500s Holding off on torsemide 60, Coreg and losartan due to low blood pressure Monitor closely for fluid overload in view of IV hydration with known severe aortic stenosis   Chronic pain syndrome- (present on admission) Continue Lyrica   Essential hypertension- (present on admission) Holding all antihypertensives due to hypotension  Depression/anxiety-  - continue home medications  DVT prophylaxis: heparin bolus via infusion 1,100 Units Start: 04/22/21 0900   Diet:  Diet Orders (From admission, onward)     Start     Ordered   04/21/21 1956  Diet heart healthy/carb modified Room service appropriate? Yes; Fluid consistency: Thin  Diet effective now       Question Answer Comment  Diet-HS Snack? Nothing   Room service appropriate? Yes   Fluid consistency: Thin      04/21/21 1958            Subjective 04/22/21    Pt reports feeling much improved today. She is very concerned about using her home insulin pump and refusing any finger sticks for glucose  monitoring.  Disposition Plan & Communication  Patient status: Inpatient  Admitted From: Home Disposition: TBD Anticipated discharge date: TBD  Family Communication: son at bedside  Consults, Procedures, Significant Events  Consultants:  GI Cardiology   Procedures/significant events:  None  Antimicrobials:  Anti-infectives (From admission, onward)    None       Objective   Vitals:   04/21/21 2330 04/22/21 0000 04/22/21 0330 04/22/21 0600  BP:  (!) 104/58  112/63  Pulse: 85 84  85  Resp: 17 18  13   Temp:   98.5 F (36.9 C)   TempSrc:   Oral   SpO2: 100% 100%  95%  Weight:      Height:        Intake/Output Summary (Last 24 hours) at 04/22/2021 0805 Last data filed at 04/22/2021 0330 Gross per 24 hour  Intake 1358.12 ml  Output --  Net 1358.12 ml   Filed Weights   04/21/21 1724  Weight: 86.2 kg    Patient BMI: Body mass index is 31.62 kg/m.   Physical Exam:  General: awake, alert, NAD HEENT: atraumatic, clear conjunctiva, anicteric sclera, MMM, hearing grossly normal Respiratory: normal respiratory effort. Cardiovascular: quick capillary refill  Nervous: A&O x3. no gross focal neurologic deficits, normal speech Extremities: Bilateral BKA Skin: dry, intact, normal temperature, normal color. No rashes, lesions or ulcers on exposed skin Psychiatry: normal mood, congruent  affect  Labs   I have personally reviewed following labs and imaging studies CBC    Component Value Date/Time   WBC 11.8 (H) 04/21/2021 1737   RBC 3.10 (L) 04/21/2021 1737   HGB 7.9 (L) 04/22/2021 0516   HGB 9.3 (L) 01/31/2021 1655   HCT 25.3 (L) 04/22/2021 0516   HCT 28.2 (L) 01/31/2021 1655   PLT 211 04/21/2021 1737   PLT 163 01/31/2021 1655   MCV 91.0 04/21/2021 1737   MCV 87 01/31/2021 1655   MCH 27.1 04/21/2021 1737   MCHC 29.8 (L) 04/21/2021 1737   RDW 17.0 (H) 04/21/2021 1737   RDW 17.4 (H) 01/31/2021 1655   LYMPHSABS 1.0 04/21/2021 1737   MONOABS 0.5 04/21/2021 1737    EOSABS 0.3 04/21/2021 1737   BASOSABS 0.0 04/21/2021 1737   BMP Latest Ref Rng & Units 04/21/2021 02/22/2021 02/21/2021  Glucose 70 - 99 mg/dL 316(H) 239(H) 328(H)  BUN 8 - 23 mg/dL 49(H) 57(H) 63(H)  Creatinine 0.44 - 1.00 mg/dL 2.58(H) 1.94(H) 2.50(H)  BUN/Creat Ratio 12 - 28 - - -  Sodium 135 - 145 mmol/L 134(L) 136 133(L)  Potassium 3.5 - 5.1 mmol/L 4.7 4.0 3.9  Chloride 98 - 111 mmol/L 96(L) 97(L) 96(L)  CO2 22 - 32 mmol/L 25 27 24   Calcium 8.9 - 10.3 mg/dL 8.5(L) 8.5(L) 8.5(L)   Imaging Studies  DG Chest 2 View  Result Date: 04/21/2021 CLINICAL DATA:  Right hip pain after a fall. Sweating, confusion, syncope EXAM: CHEST - 2 VIEW COMPARISON:  02/20/2021 FINDINGS: Postoperative changes in the mediastinum. Cardiac enlargement. Lungs are clear. No pleural effusions. No pneumothorax. Mediastinal contours appear intact. IMPRESSION: No active cardiopulmonary disease. Electronically Signed   By: Lucienne Capers M.D.   On: 04/21/2021 18:38   CT HEAD WO CONTRAST (5MM)  Result Date: 04/21/2021 CLINICAL DATA:  Syncope versus seizure.  Anticoagulated. EXAM: CT HEAD WITHOUT CONTRAST TECHNIQUE: Contiguous axial images were obtained from the base of the skull through the vertex without intravenous contrast. RADIATION DOSE REDUCTION: This exam was performed according to the departmental dose-optimization program which includes automated exposure control, adjustment of the mA and/or kV according to patient size and/or use of iterative reconstruction technique. COMPARISON:  02/19/2021 FINDINGS: Brain: Generalized atrophy. Chronic small-vessel ischemic changes of the cerebral hemispheric white matter. No sign of acute infarction, mass lesion, hemorrhage, hydrocephalus or extra-axial collection. Vascular: There is atherosclerotic calcification of the major vessels at the base of the brain. Skull: Negative Sinuses/Orbits: Clear except for a retention cyst in the sphenoid sinus. Orbits negative. Other: None  IMPRESSION: No acute finding. Volume loss. Chronic small-vessel ischemic changes of the white matter. Atherosclerotic calcification of the major vessels at the base of the brain. Electronically Signed   By: Nelson Chimes M.D.   On: 04/21/2021 18:03   DG Hip Unilat W or Wo Pelvis 2-3 Views Right  Result Date: 04/21/2021 CLINICAL DATA:  Right hip pain after a fall. EXAM: DG HIP (WITH OR WITHOUT PELVIS) 2-3V RIGHT COMPARISON:  Left femur 08/20/2018 FINDINGS: Degenerative changes in the lower lumbar spine and hips. Prior postoperative fixation of the left hip with short intramedullary rod and screw fixation. The right hip demonstrates no evidence of acute fracture or dislocation. There is cortical disruption and irregularity along the superior aspect of the right side of the symphysis pubis which possibly represents nondisplaced fracture. Symphysis pubis is not displaced. Visualized sacrum is intact. Vascular calcifications. IMPRESSION: 1. No evidence of acute fracture or displacement of the  right hip. 2. Cortical irregularity consistent with nondisplaced fracture of the superior right pubic ramus. 3. Old fracture deformity of the left hip with postoperative fixation. Electronically Signed   By: Lucienne Capers M.D.   On: 04/21/2021 18:41    Medications   Scheduled Meds:  aspirin EC  81 mg Oral Daily   ezetimibe  10 mg Oral Daily   heparin  1,100 Units Intravenous Once   insulin aspart  0-15 Units Subcutaneous TID WC   insulin aspart  0-5 Units Subcutaneous QHS   insulin glargine-yfgn  30 Units Subcutaneous QHS   Levetiracetam  1,500 mg Oral QHS   rosuvastatin  40 mg Oral Daily   No recently discontinued medications to reconcile  LOS: 1 day   Richarda Osmond, DO [facility] 04/22/2021, 8:05 AM   Available by Epic secure chat 7AM-7PM. If 7PM-7AM, please contact night-coverage Refer to [email or web address] to contact the [facility] Attending or Consulting provider for this pt

## 2021-04-22 NOTE — Consult Note (Signed)
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 Epioble of wound edges bilaterally; discussed this concept with patient and CG. Dressing procedure/placement/frequency: Continue silicone foam; change every 3 days Patient's son questions use HBO; discussed rationale for HBO therapy and that his mom's current wounds were related to prothesis.  They are working with her orthotist on correcting issues.  Patient's son ask questions about wound care center, discussed options, they can contact wound care center to make outpatient appointment if desired.  Discussed blood glucose and diet and impact on wound healing.  Patient is using silicone foam at home to protect wounds  Patient could benefit from wound care center referral for serial debridements and opening of wound edges along with needed continued follow up with prosthetist to correct device pressure and sheer.   Discussed POC with patient and bedside nurse.  Re consult if needed, will not follow at this time. Thanks  Keeven Matty [employer], RN,CWOCN, CNS, [place] [phone])

## 2021-04-22 NOTE — Consult Note (Signed)
Cardiology Consultation:   Patient ID: [name] MRN: [id number]; DOB: Apr 14, 1955  Admit date: 04/21/2021 Date of Consult: 04/22/2021  PCP:  [name], MD   [facility] Cardiologist:  Minus Breeding, MD   {  Patient Profile:   [name] is a 66 y.o. female with a hx of ith a hx of CABG x4 '08, diabetes, bilateral BKAs, carotid stenosis status post CEA '07, hypertension, hyperlipidemia, seizures and CKD  who is being seen 04/22/2021 for the evaluation of syncope at the request of dr. Ouida Sills.  History of Present Illness:   [name] patient has been followed by Dr. Percival Spanish for the above cardiac issues.  Prior cardiology care has been at [facility].  She has remote history of CAD with four-vessel CABG in 2008.  She was seen in the office 01/31/2021 after admission to [facility] for acute heart failure.  Echo 12/04/2020 showed an EF of 30 to 35% with akinesis of the mid anterior, mid anterior septal, basal anterior/septal segments.  Moderate thickening of the aortic valve with severe aortic stenosis, mild MR.  Patient was seen 01/31/2021 with consideration of setting her up for cardiac catheterization but creatinine was elevated 2.83.  Her Demadex was reduced and outpatient echo was ordered.  It was felt that she would eventually need to undergo right and left cardiac cath to evaluate for possible TAVR in the future.  She was also referred to nephrology.  Echocardiogram 01/30/2021 noted LVEF of 60 to 65%, no wall motion abnormality, normal RV, mildly elevated left atrium, mild to moderate mitral regurgitation, moderate mitral stenosis with severe aortic stenosis mean gradient 25 mmHg.  Seen in consultation in December 2022 for elevated troponin the setting of acute respiratory failure with hypoxia due to COVID-19.  Troponin elevation suspected demand ischemia.  The patient presented to the [facility] ED 2/5 for syncope and collapse.Patient had a mechanical fall  earlier in the day. She tried to hold on to the handle on a cabinet in the kitchen. She fell on her hip. Later in the day they were out shopping. While they were getting the patient in the car she experienced LOC. It was witnessed by her son. Says the patient was out for about 5 minutes, he quickly drove her to the ER. The patient does not remember and pro-dromal or post-dromal symptoms.   In the ER BP was 114/53, as low as 73/48. O2 98% on RA placed on 2L O2. Scr 2.58, WBC 11.8, Hgb 8.4, baseline 9.3. Glucose 316, Hs trop 876. EKG showed SR 88bpm, LBBB. CXR unremarkable. CT head nonacute. She was given IVF.    Past Medical History:  Diagnosis Date   Acute congestive heart failure 03/29/2018   Acute encephalopathy 09/02/2013   Anemia, normocytic normochromic 03/14/2008   Arthritis    "hands"   Atherosclerosis of native arteries of the extremities with ulceration 07/14/2013   Atherosclerosis of native artery of extremity with intermittent claudication 09/16/2011   CAD (coronary artery disease) of artery bypass graft 03/14/2008   Chronic kidney disease (CKD) 12/26/2015   stage 3 due to type 2 diabetes mellitus   Chronic pain syndrome 27/51/7001   Complication of anesthesia    pt felt like she had a hard time waking up after surgery in Feb. 2015.   Coronary artery disease involving native coronary artery without angina pectoris 09/25/2015   Essential hypertension 03/14/2008   Gastroesophageal reflux disease with esophagitis 07/09/2016   Generalized anxiety disorder with panic  attacks    History of one panic attack   GERD (gastroesophageal reflux disease)    takes Zantac   Hyperlipidemia, unspecified 03/14/2008   Localization-related symptomatic epilepsy and epileptic syndromes with complex partial seizures, not intractable, without status epilepticus 06/27/2014   Major depressive disorder 03/14/2008   Neuropathy    Osteomyelitis of ankle or foot, left, acute 06/17/2013   Peripheral  vascular disease    PVD (peripheral vascular disease) 03/25/2012   Type 2 diabetes mellitus with diabetic mononeuropathy, with long-term current use of insulin 03/14/2008    Past Surgical History:  Procedure Laterality Date   AMPUTATION Left 08/22/2013   Procedure: LEFT BELOW KNEE AMPUTATION;  Surgeon: Newt Minion, MD;  Location: [place];  Service: Orthopedics;  Laterality: Left;   ANKLE FUSION Left 05/11/2013   TIBIOCALCANEAL FUSION    ANKLE FUSION Left 05/12/2013   Procedure: LEFT TIBIOCALCANEAL FUSION;  Surgeon: Newt Minion, MD;  Location: [place];  Service: Orthopedics;  Laterality: Left;  Left Tibiocalcaneal Fusion   APPLICATION OF WOUND VAC  06/17/2013   Procedure: APPLICATION OF WOUND VAC;  Surgeon: Newt Minion, MD;  Location: [place];  Service: Orthopedics;;   BELOW KNEE LEG AMPUTATION Right Jan. 2008   CAROTID ENDARTERECTOMY Right    CATARACT EXTRACTION Left 12/25/14   CHOLECYSTECTOMY     COLONOSCOPY     CORONARY ARTERY BYPASS GRAFT  12/01/2007   "CABG X4" (05/12/2013)   DILATION AND CURETTAGE OF UTERUS     I & D EXTREMITY Left 06/17/2013   Procedure: IRRIGATION AND DEBRIDEMENT EXTREMITY, PLACEMENT ANTIBIOTIC STIMULAN BEADS;  Surgeon: Newt Minion, MD;  Location: [place];  Service: Orthopedics;  Laterality: Left;   INTRAMEDULLARY (IM) NAIL INTERTROCHANTERIC Left 08/20/2018   Procedure: INTRAMEDULLARY (IM) NAIL INTERTROCHANTRIC;  Surgeon: Shona Needles, MD;  Location: [place];  Service: Orthopedics;  Laterality: Left;   IRRIGATION AND DEBRIDEMENT ABSCESS Left 06/17/2013   ANKLE           DR [name]   TOE AMPUTATION Right    "took off a couple toes before the amputation"   TONSILLECTOMY       Home Medications:  Prior to Admission medications   Medication Sig Start Date End Date Taking? Authorizing Provider  acetaminophen-codeine (TYLENOL #3) 300-30 MG tablet Take 1 tablet by mouth every 8 (eight) hours as needed for moderate pain. 09/06/20   [provider]  albuterol (VENTOLIN  HFA) 108 (90 Base) MCG/ACT inhaler Inhale 1-2 puffs into the lungs every 6 (six) hours as needed for wheezing or shortness of breath.  05/13/17   [provider]  ALPRAZolam Duanne Moron) 0.5 MG tablet Take 0.5 mg by mouth 2 (two) times daily as needed for anxiety. 09/06/19   [provider]  aspirin 81 MG tablet Take 1 tablet (81 mg total) by mouth daily. 05/13/17   Libby Maw, MD  carvedilol (COREG) 6.25 MG tablet Take 1 tablet (6.25 mg total) by mouth 2 (two) times daily. 02/06/21   Minus Breeding, MD  clopidogrel (PLAVIX) 75 MG tablet Take 1 tablet (75 mg total) by mouth daily. 05/13/17   Libby Maw, MD  cycloSPORINE (RESTASIS) 0.05 % ophthalmic emulsion Place 1 drop into both eyes 2 (two) times daily as needed (dry eyes).    [provider]  empagliflozin (JARDIANCE) 10 MG TABS tablet Take 10 mg by mouth daily.    [provider]  ezetimibe (ZETIA) 10 MG tablet Take 10 mg by mouth daily.  [provider]  insulin lispro (HUMALOG) 100 UNIT/ML injection Inject 12 Units into the skin 2 (two) times daily with a meal. 11/21/20   [provider]  LANTUS SOLOSTAR 100 UNIT/ML Solostar Pen Inject 38 Units into the skin at bedtime. 07/13/18   [provider]  Levetiracetam 750 MG TB24 Take 2 tablets (1,500 mg total) by mouth at bedtime. 04/08/21   Cameron Sprang, MD  lidocaine (LIDODERM) 5 % Place 1 patch onto the skin daily as needed (pain). 07/16/18   [provider]  losartan (COZAAR) 50 MG tablet Take 50 mg by mouth daily.    [provider]  Multiple Vitamins-Minerals (MULTIVITAMINS THER. W/MINERALS) TABS Take 1 tablet by mouth daily.      [provider]  nitroGLYCERIN (NITROSTAT) 0.4 MG SL tablet Place 0.4 mg under the tongue every 5 (five) minutes as needed for chest pain.    [provider]  potassium chloride SA (KLOR-CON) 20 MEQ tablet Take 20 mEq by mouth 2 (two) times daily.     [provider]  predniSONE (DELTASONE) 10 MG tablet Prednisone 40 mg po daily x 2 day then Prednisone 30 mg po daily x 2 day then Prednisone 20 mg po daily x 2 day then Prednisone 10 mg daily x 1 day then stop... 02/23/21   Oswald Hillock, MD  pregabalin (LYRICA) 50 MG capsule Take 50 mg by mouth daily as needed (severe pain).    [provider]  ranolazine (RANEXA) 500 MG 12 hr tablet Take 1,000 mg by mouth 2 (two) times daily as needed (chest pain).    [provider]  rosuvastatin (CRESTOR) 40 MG tablet Take 40 mg by mouth daily.    [provider]  sertraline (ZOLOFT) 100 MG tablet Take 1 tablet (100 mg total) by mouth 2 (two) times daily. 05/13/17   Libby Maw, MD  torsemide (DEMADEX) 20 MG tablet Take 60 mg by mouth daily as needed (fluid/swelling).    [provider]    Inpatient Medications: Scheduled Meds:  sodium chloride   Intravenous Once   aspirin EC  81 mg Oral Daily   ezetimibe  10 mg Oral Daily   insulin aspart  0-15 Units Subcutaneous TID WC   insulin aspart  0-5 Units Subcutaneous QHS   insulin glargine-yfgn  30 Units Subcutaneous QHS   levETIRAcetam  1,500 mg Oral QHS   rosuvastatin  40 mg Oral Daily   Continuous Infusions:  heparin 1,050 Units/hr (04/22/21 0754)   PRN Meds: acetaminophen, nitroGLYCERIN, ondansetron (ZOFRAN) IV  Allergies:    Allergies  Allergen Reactions   Metformin Hcl Nausea And Vomiting   Ace Inhibitors Cough    cough   Actos [Pioglitazone] Swelling   Atorvastatin Other (See Comments)    Other reaction(s): Other (See Comments) myalgias   Candesartan Cilexetil Other (See Comments)    Rash "cant recall"    Gabapentin Other (See Comments)    Dizziness and unbalanced   Isosorbide Nitrate Other (See Comments)    HA.   Quinapril Hcl Cough   Tramadol Hcl Other (See Comments)    seziure   Tape Rash    Redness, rash, itchiness Redness, rash, itchiness Redness, rash, itchiness No  Plastic tape, paper tape OK Redness, rash, itchiness Redness, rash, itchiness No Plastic tape, paper tape OK    Social History:   Social History   Socioeconomic History   Marital status: Married    Spouse name: Not on file  Number of children: 2   Years of education: 14   Highest education level: Associate degree: occupational, Hotel manager, or vocational program  Occupational History   Occupation: Disability  Tobacco Use   Smoking status: Never   Smokeless tobacco: Never  Vaping Use   Vaping Use: Never used  Substance and Sexual Activity   Alcohol use: No   Drug use: No   Sexual activity: Not Currently  Other Topics Concern   Not on file  Social History Narrative   Right handed    Lives in a one story home   Drinks caffeine    Social Determinants of Health   Financial Resource Strain: Not on file  Food Insecurity: Not on file  Transportation Needs: Not on file  Physical Activity: Not on file  Stress: Not on file  Social Connections: Not on file  Intimate Partner Violence: Not on file    Family History:    Family History  Problem Relation Age of Onset   Cancer Mother    Heart disease Father    Diabetes Father    Hyperlipidemia Father    Hypertension Father    Alzheimer's disease Maternal Grandfather      ROS:  Please see the history of present illness.   All other ROS reviewed and negative.     Physical Exam/Data:   Vitals:   04/22/21 0900 04/22/21 1000 04/22/21 1100 04/22/21 1200  BP: 106/67 (!) 79/65 106/68 110/65  Pulse: 84 88 88 93  Resp: 13 15 13 20   Temp:      TempSrc:      SpO2: 100% 97% 90% 90%  Weight:      Height:        Intake/Output Summary (Last 24 hours) at 04/22/2021 1217 Last data filed at 04/22/2021 0330 Gross per 24 hour  Intake 1358.12 ml  Output --  Net 1358.12 ml   Last 3 Weights 04/21/2021 02/21/2021 02/19/2021  Weight (lbs) 190 lb 181 lb 7 oz 181 lb 3.2 oz  Weight (kg) 86.183 kg 82.3 kg 82.192 kg     Body mass index is  31.62 kg/m.  General:  Well nourished, well developed, in no acute distress HEENT: normal Neck: no JVD Vascular: No carotid bruits; Distal pulses 2+ bilaterally Cardiac:  normal S1, S2; RRR; no murmur  Lungs:  clear to auscultation bilaterally, no wheezing, rhonchi or rales  Abd: soft, nontender, no hepatomegaly  Ext: no edema Musculoskeletal: b/l amputee  Skin: warm and dry  Neuro:  CNs 2-12 intact, no focal abnormalities noted Psych:  Normal affect   EKG:  The EKG was personally reviewed and demonstrates:  NSR, HR 60-70 Telemetry:  Telemetry was personally reviewed and demonstrates:  NSR, LBBB, 88bpm, no changes  Relevant CV Studies:  Echo 04/22/21 1. Left ventricular ejection fraction, by estimation, is 35 to 40%. The  left ventricle has moderately decreased function. Left ventricular  endocardial border not optimally defined to evaluate regional wall motion.  There is mild left ventricular  hypertrophy. Left ventricular diastolic parameters are indeterminate.   2. Right ventricular systolic function is normal. The right ventricular  size is normal. There is mildly elevated pulmonary artery systolic  pressure.   3. Left atrial size was moderately dilated.   4. The mitral valve is normal in structure. No evidence of mitral valve  regurgitation. Mild to moderate mitral stenosis. The mean mitral valve  gradient is 5.0 mmHg. Severe mitral annular calcification.   5. The aortic valve is calcified. Aortic  valve regurgitation is not  visualized. Moderate to severe aortic valve stenosis. Aortic valve mean  gradient measures 24.0 mmHg. Aortic valve is not well seen. Possible low  flow-low gradient severe aortic  stenosis.   Echo 01/30/22 1. Left ventricular ejection fraction, by estimation, is 60 to 65%. The  left ventricle has normal function. The left ventricle has no regional  wall motion abnormalities. Left ventricular diastolic parameters are  indeterminate.   2. Right  ventricular systolic function is normal. The right ventricular  size is normal.   3. Left atrial size was mildly dilated.   4. The mitral valve is degenerative. Mild to moderate mitral valve  regurgitation. Moderate mitral stenosis. Severe mitral annular  calcification.   5. The aortic valve is calcified. There is severe calcifcation of the  aortic valve. There is severe thickening of the aortic valve. Aortic valve  regurgitation is not visualized. Moderate to severe aortic valve stenosis.   6. The inferior vena cava is normal in size with greater than 50%  respiratory variability, suggesting right atrial pressure of 3 mmHg.   Comparison(s): Outside echocardiogram September 2022 demonstrated an EF of  30 to 35%. There was thinning and akinesis of the mid anterior, mid  anteroseptal, basal anterior and basal anteroseptal segments. There was  moderately thickened aortic valve with  severe aortic stenosis. There was mild mitral regurgitation.     Laboratory Data:  High Sensitivity Troponin:   Recent Labs  Lab 04/21/21 1737 04/21/21 1956  TROPONINIHS 876* 822*     Chemistry Recent Labs  Lab 04/21/21 1737  NA 134*  K 4.7  CL 96*  CO2 25  GLUCOSE 316*  BUN 49*  CREATININE 2.58*  CALCIUM 8.5*  GFRNONAA 20*  ANIONGAP 13    Recent Labs  Lab 04/21/21 1737  PROT 6.8  ALBUMIN 3.3*  AST 48*  ALT 29  ALKPHOS 52  BILITOT 0.7   Lipids No results for input(s): CHOL, TRIG, HDL, LABVLDL, LDLCALC, CHOLHDL in the last 168 hours.  Hematology Recent Labs  Lab 04/21/21 1737 04/22/21 0312 04/22/21 0516  WBC 11.8*  --   --   RBC 3.10*  --   --   HGB 8.4* 7.5* 7.9*  HCT 28.2* 23.8* 25.3*  MCV 91.0  --   --   MCH 27.1  --   --   MCHC 29.8*  --   --   RDW 17.0*  --   --   PLT 211  --   --    Thyroid No results for input(s): TSH, FREET4 in the last 168 hours.  BNP Recent Labs  Lab 04/21/21 1755  BNP 583.7*    DDimer  Recent Labs  Lab 04/21/21 1756  DDIMER 18.50*      Radiology/Studies:  DG Chest 2 View  Result Date: 04/21/2021 CLINICAL DATA:  Right hip pain after a fall. Sweating, confusion, syncope EXAM: CHEST - 2 VIEW COMPARISON:  02/20/2021 FINDINGS: Postoperative changes in the mediastinum. Cardiac enlargement. Lungs are clear. No pleural effusions. No pneumothorax. Mediastinal contours appear intact. IMPRESSION: No active cardiopulmonary disease. Electronically Signed   By: Lucienne Capers M.D.   On: 04/21/2021 18:38   CT HEAD WO CONTRAST (5MM)  Result Date: 04/21/2021 CLINICAL DATA:  Syncope versus seizure.  Anticoagulated. EXAM: CT HEAD WITHOUT CONTRAST TECHNIQUE: Contiguous axial images were obtained from the base of the skull through the vertex without intravenous contrast. RADIATION DOSE REDUCTION: This exam was performed according to the departmental dose-optimization program which  includes automated exposure control, adjustment of the mA and/or kV according to patient size and/or use of iterative reconstruction technique. COMPARISON:  02/19/2021 FINDINGS: Brain: Generalized atrophy. Chronic small-vessel ischemic changes of the cerebral hemispheric white matter. No sign of acute infarction, mass lesion, hemorrhage, hydrocephalus or extra-axial collection. Vascular: There is atherosclerotic calcification of the major vessels at the base of the brain. Skull: Negative Sinuses/Orbits: Clear except for a retention cyst in the sphenoid sinus. Orbits negative. Other: None IMPRESSION: No acute finding. Volume loss. Chronic small-vessel ischemic changes of the white matter. Atherosclerotic calcification of the major vessels at the base of the brain. Electronically Signed   By: Nelson Chimes M.D.   On: 04/21/2021 18:03   ECHOCARDIOGRAM COMPLETE  Result Date: 04/22/2021    ECHOCARDIOGRAM REPORT   Patient Name:   [name] Date of Exam: 04/22/2021 Medical Rec #:  [id number]        Height:       65.0 in Accession #:    [id number]       Weight:       190.0 lb  Date of Birth:  03/08/1956         BSA:          1.935 m Patient Age:    42 years         BP:           119/69 mmHg Patient Gender: F                HR:           86 bpm. Exam Location:  [facility] Procedure: 2D Echo, Color Doppler, Cardiac Doppler and Intracardiac            Opacification Agent Indications:     R55 Syncope  History:         Patient has prior history of Echocardiogram examinations, most                  recent 01/30/2021. CHF, CAD, Prior CABG, PVD; Risk                  Factors:Dyslipidemia and Diabetes.  Sonographer:     Charmayne Sheer Referring Phys:  [id number] Athena Masse Diagnosing Phys: Kathlyn Sacramento MD  Sonographer Comments: Suboptimal apical window and no subcostal window. IMPRESSIONS  1. Left ventricular ejection fraction, by estimation, is 35 to 40%. The left ventricle has moderately decreased function. Left ventricular endocardial border not optimally defined to evaluate regional wall motion. There is mild left ventricular hypertrophy. Left ventricular diastolic parameters are indeterminate.  2. Right ventricular systolic function is normal. The right ventricular size is normal. There is mildly elevated pulmonary artery systolic pressure.  3. Left atrial size was moderately dilated.  4. The mitral valve is normal in structure. No evidence of mitral valve regurgitation. Mild to moderate mitral stenosis. The mean mitral valve gradient is 5.0 mmHg. Severe mitral annular calcification.  5. The aortic valve is calcified. Aortic valve regurgitation is not visualized. Moderate to severe aortic valve stenosis. Aortic valve mean gradient measures 24.0 mmHg. Aortic valve is not well seen. Possible low flow-low gradient severe aortic stenosis. FINDINGS  Left Ventricle: Left ventricular ejection fraction, by estimation, is 35 to 40%. The left ventricle has moderately decreased function. Left ventricular endocardial border not optimally defined to evaluate regional wall motion. Definity contrast agent was given  IV to delineate the left ventricular endocardial borders. The left ventricular internal cavity size was normal  in size. There is mild left ventricular hypertrophy. Left ventricular diastolic parameters are indeterminate. Right Ventricle: The right ventricular size is normal. No increase in right ventricular wall thickness. Right ventricular systolic function is normal. There is mildly elevated pulmonary artery systolic pressure. The tricuspid regurgitant velocity is 3.03  m/s, and with an assumed right atrial pressure of 5 mmHg, the estimated right ventricular systolic pressure is 67.8 mmHg. Left Atrium: Left atrial size was moderately dilated. Right Atrium: Right atrial size was normal in size. Pericardium: There is no evidence of pericardial effusion. Mitral Valve: The mitral valve is normal in structure. Severe mitral annular calcification. No evidence of mitral valve regurgitation. Mild to moderate mitral valve stenosis. MV peak gradient, 9.1 mmHg. The mean mitral valve gradient is 5.0 mmHg. Tricuspid Valve: The tricuspid valve is normal in structure. Tricuspid valve regurgitation is mild . No evidence of tricuspid stenosis. Aortic Valve: The aortic valve is calcified. Aortic valve regurgitation is not visualized. Moderate to severe aortic stenosis is present. Aortic valve mean gradient measures 24.0 mmHg. Aortic valve peak gradient measures 37.9 mmHg. Aortic valve area, by VTI measures 0.91 cm. Pulmonic Valve: The pulmonic valve was normal in structure. Pulmonic valve regurgitation is mild. No evidence of pulmonic stenosis. Aorta: The aortic root is normal in size and structure. Venous: The inferior vena cava was not well visualized. IAS/Shunts: No atrial level shunt detected by color flow Doppler.  LEFT VENTRICLE PLAX 2D LVIDd:         3.49 cm   Diastology LVIDs:         2.85 cm   LV e' medial:    3.59 cm/s LV PW:         1.05 cm   LV E/e' medial:  43.7 LV IVS:        0.80 cm   LV e' lateral:   6.09 cm/s LVOT  diam:     2.00 cm   LV E/e' lateral: 25.8 LV SV:         47 LV SV Index:   24 LVOT Area:     3.14 cm  LEFT ATRIUM           Index LA diam:      4.50 cm 2.33 cm/m LA Vol (A4C): 67.7 ml 34.98 ml/m  AORTIC VALVE                     PULMONIC VALVE AV Area (Vmax):    0.79 cm      PV Vmax:       1.05 m/s AV Area (Vmean):   0.93 cm      PV Vmean:      69.500 cm/s AV Area (VTI):     0.91 cm      PV VTI:        0.177 m AV Vmax:           308.00 cm/s   PV Peak grad:  4.4 mmHg AV Vmean:          183.200 cm/s  PV Mean grad:  2.0 mmHg AV VTI:            0.516 m AV Peak Grad:      37.9 mmHg AV Mean Grad:      24.0 mmHg LVOT Vmax:         77.70 cm/s LVOT Vmean:        54.400 cm/s LVOT VTI:          0.149  m LVOT/AV VTI ratio: 0.29  AORTA Ao Root diam: 2.30 cm MITRAL VALVE                TRICUSPID VALVE MV Area (PHT): 3.77 cm     TR Peak grad:   36.7 mmHg MV Area VTI:   1.19 cm     TR Vmax:        303.00 cm/s MV Peak grad:  9.1 mmHg MV Mean grad:  5.0 mmHg     SHUNTS MV Vmax:       1.51 m/s     Systemic VTI:  0.15 m MV Vmean:      107.0 cm/s   Systemic Diam: 2.00 cm MV Decel Time: 201 msec MV E velocity: 157.00 cm/s MV A velocity: 140.00 cm/s MV E/A ratio:  1.12 Kathlyn Sacramento MD Electronically signed by Kathlyn Sacramento MD Signature Date/Time: 04/22/2021/10:31:39 AM    Final    DG Hip Unilat W or Wo Pelvis 2-3 Views Right  Result Date: 04/21/2021 CLINICAL DATA:  Right hip pain after a fall. EXAM: DG HIP (WITH OR WITHOUT PELVIS) 2-3V RIGHT COMPARISON:  Left femur 08/20/2018 FINDINGS: Degenerative changes in the lower lumbar spine and hips. Prior postoperative fixation of the left hip with short intramedullary rod and screw fixation. The right hip demonstrates no evidence of acute fracture or dislocation. There is cortical disruption and irregularity along the superior aspect of the right side of the symphysis pubis which possibly represents nondisplaced fracture. Symphysis pubis is not displaced. Visualized sacrum is intact.  Vascular calcifications. IMPRESSION: 1. No evidence of acute fracture or displacement of the right hip. 2. Cortical irregularity consistent with nondisplaced fracture of the superior right pubic ramus. 3. Old fracture deformity of the left hip with postoperative fixation. Electronically Signed   By: Lucienne Capers M.D.   On: 04/21/2021 18:41     Assessment and Plan:   Syncope - unclear etiology, suspect multifactorial with severe AS, hypotension, diuretic therapy, anemia - s/p IVF - antihypertensives held - CT head was non-acute - elevated D-dimer, V/Q scan ordered - tele with no arrhythmia, will likely need heart monitor as OP - Echo this admission showed reduced EF at 35-40%, mild LVH, normal. Has been reduced in the past - further work-up per IM  Severe AS - Echo 11/2020 showed LVEF 30-35% - Echo 01/2021 showed LVEF 60-65%, no WMA, mild to mod MR, mod to severe AS. - Echo this admission showed LVEF 35-40%, mild LVH, normal RV function,mild to mod MD, mean gradient 67mmHg, mod to severe AS, mean gradient 29mmHG, possible low-flow severe AS - Dr. Percival Spanish was considering heart cath, but kidney function was limiting further work-up - can re-visit further valve work-up pending kidney function - s/p IVF, would be cautious with this  Acute on chronic diastolic CHF -  Echo this admission showed reduced EF 35-40%. - BNP 583, CXR clear - does not appear significantly volume up on exam - PTA torsemide 20mg  daily>>would restart - restart PTA Coreg and losartan as able - not a good cath candidate given CKD  ?NSTEMI Elevated troponin CAD s/p remote CABG x4 - HS trop elevated to 876>>822 - No anginal symptoms reported - EKG with no ischemic changes - IV heparin x 48 hours - PTA Aspirin, plavix, Coreg, zetia, losartan crestor - not a good candidate for cath gas above  AKI on CKD stage 4 - Scr 2.58, BUN 49 - Baseline around 2 - she follows with nephrology as OP  Acute on chronic  Anemia Hematochezia - Hgb 7.5 on admission - Hgb baseline around 9 - reports hematochezia - daily CBC with IV heparin  Closed rt pubic ramus fracture - PR eval - per IM    For questions or updates, please contact [facility] Please consult [email or web address] for contact info under    Signed, Issiah Huffaker Ninfa Meeker, PA-C  04/22/2021 12:17 PM

## 2021-04-22 NOTE — Progress Notes (Signed)
Patient's libre freestyle glucose monitor reads 257.  Patient refuses novolog but will get long acting insulin. Will continue to monitor.  Christene Slates

## 2021-04-22 NOTE — Progress Notes (Signed)
Inpatient Diabetes Program Recommendations  AACE/ADA: New Consensus Statement on Inpatient Glycemic Control   Target Ranges:  Prepandial:   less than 140 mg/dL      Peak postprandial:   less than 180 mg/dL (1-2 hours)      Critically ill patients:  140 - 180 mg/dL    Latest Reference Range & Units 04/21/21 17:26  Glucose-Capillary 70 - 99 mg/dL 284 (H)    Review of Glycemic Control  Diabetes history: DM2 Outpatient Diabetes medications: Lantus 38 units BID, Humalog 12 units BID with meals, Jardiance 10 mg daily Current orders for Inpatient glycemic control: Semglee 30 units daily, Novolog 0-15 units TID with meals, Novolog 0-5 units QHS  Inpatient Diabetes Program Recommendations:    CBG finger sticks and insulin: Patient refusing finger stick glucose and Novolog insulin. Patient states she will only take Lantus and Humalog insulin for DM; not willing to take [facility] and Novolog on hospital formulary.  NOTE: Noted no CBGs in chart today. Communicated with Myra, RN and she reported patient had CGM sensor and would not allow finger sticks and was refusing Novolog insulin. Spoke with patient at bedside regarding DM control. Patient has FreeStyle Libre CGM on left arm and using her phone app to read glucose from sensor. Patient states her glucose is usually 100-200's mg/dl. Patient's son is at bedside and states her glucose was 143 mg/dl this morning and currently 240 mg/dl.  Patient states she takes Lantus 38 units BID (home med list has 38 units daily) and Humalog 12 units BID with meals, and Jardiance 10 mg daily. Patient states that she does not use the Humalog if she is not eating and notes she does not eat consistently and notes she does not use the Humalog much. Patient states she will only take insulin inpatient as she takes outpatient (using Lantus and Humalog). Explained to patient that hospital has Semglee and Novolog insulin on formulary. Discussed Semglee and Lantus and Humalog and  Novolog and explained that they work very similar but made by different companies.  Explained that she received Semglee 30 units last night. Patient states she is not willing to take [facility] and Novolog while inpatient. Asked patient if she would be willing to have her family bring her Lantus and Humalog from home if attending provider agreeable to use it. Patient stated she will not have her family bring home supply of her Lantus and Humalog and stated "You can go to the pharmacy down the road and get Lantus and Humalog if you want me to take any insulin while I am in the hospital." Tried to reason with patient and her son also tried to encourage her to take formulary insulin while inpatient but patient is insistent that she will not take Semglee and Novolog insulin. Explained to patient that with her current glucose up to 240 mg/dl, I was concerned that her glucose was continue to increase since she is eating and not taking any short acting insulin. Patient stated that she was tired of talking about her diabetes and insulin. She states she has had diabetes for a long time and knows what to do and that she was getting very frustrated talking about it. Patient's son states patient seems stressed and he asked about patient getting her Zoloft. Informed patient and her son that I would let Myra, RN know about information discussed and also let attending provider know she was not willing to take Semglee and Novolog insulin while inpatient.  Patient verbalized understanding of  information and have no questions at this time.  Thanks, Barnie Alderman, RN, MSN, CDE Diabetes Coordinator Inpatient Diabetes Program [phone] (Team Pager from 8am to 5pm)

## 2021-04-22 NOTE — Consult Note (Signed)
ANTICOAGULATION CONSULT NOTE  Pharmacy Consult for heparin gtt Indication: chest pain/ACS  Patient Measurements: Height: 5\' 5"  (165.1 cm) Weight: 86.2 kg (190 lb) IBW/kg (Calculated) : 57 Heparin Dosing Weight: 75.7kg  Vital Signs: Temp: 99.9 F (37.7 C) (02/06 1315) Temp Source: Oral (02/06 1315) BP: 115/78 (02/06 1315) Pulse Rate: 95 (02/06 1315)  Labs: Recent Labs    04/21/21 1737 04/21/21 1740 04/21/21 1956 04/22/21 0312 04/22/21 0516  HGB 8.4*  --   --  7.5* 7.9*  HCT 28.2*  --   --  23.8* 25.3*  PLT 211  --   --   --   --   APTT  --  30  --   --   --   LABPROT  --  14.9  --   --   --   INR  --  1.2  --   --   --   HEPARINUNFRC  --   --   --   --  0.20*  CREATININE 2.58*  --   --   --   --   TROPONINIHS 876*  --  822*  --   --      Estimated Creatinine Clearance: 24.2 mL/min (A) (by C-G formula based on SCr of 2.58 mg/dL (H)).   Medical History: Past Medical History:  Diagnosis Date   Acute congestive heart failure 03/29/2018   Acute encephalopathy 09/02/2013   Anemia, normocytic normochromic 03/14/2008   Arthritis    "hands"   Atherosclerosis of native arteries of the extremities with ulceration 07/14/2013   Atherosclerosis of native artery of extremity with intermittent claudication 09/16/2011   CAD (coronary artery disease) of artery bypass graft 03/14/2008   Chronic kidney disease (CKD) 12/26/2015   stage 3 due to type 2 diabetes mellitus   Chronic pain syndrome 17/51/0258   Complication of anesthesia    pt felt like she had a hard time waking up after surgery in Feb. 2015.   Coronary artery disease involving native coronary artery without angina pectoris 09/25/2015   Essential hypertension 03/14/2008   Gastroesophageal reflux disease with esophagitis 07/09/2016   Generalized anxiety disorder with panic attacks    History of one panic attack   GERD (gastroesophageal reflux disease)    takes Zantac   Hyperlipidemia, unspecified 03/14/2008    Localization-related symptomatic epilepsy and epileptic syndromes with complex partial seizures, not intractable, without status epilepticus 06/27/2014   Major depressive disorder 03/14/2008   Neuropathy    Osteomyelitis of ankle or foot, left, acute 06/17/2013   Peripheral vascular disease    PVD (peripheral vascular disease) 03/25/2012   Type 2 diabetes mellitus with diabetic mononeuropathy, with long-term current use of insulin 03/14/2008    Medications:  Scheduled:   sodium chloride   Intravenous Once   aspirin EC  81 mg Oral Daily   ezetimibe  10 mg Oral Daily   insulin aspart  0-15 Units Subcutaneous TID WC   insulin aspart  0-5 Units Subcutaneous QHS   insulin glargine-yfgn  30 Units Subcutaneous QHS   levETIRAcetam  1,500 mg Oral QHS   rosuvastatin  40 mg Oral Daily    Assessment: Patient admitted after syncopal episode. Noted PMH relevant for  HTN, HLD, DM, CKD, severe AS, s/p CABG and bilateral BKA, seizure disorder. Pharmacy consulted to manage heparin drip for ACS. Plan by cardiology is IV heparin x 48 hours  Goal of Therapy:  Heparin level 0.3-0.7 units/ml Monitor platelets by anticoagulation protocol: Yes   Plan:  Heparin  level remains subtherapeutic despite recent rate increase: give 2000 units bolus x 1 then increase heparin infusion rate to 1350 units/hr units/hr Check anti-Xa level in 8 hours after rate change Continue to monitor H&H and platelets  Vallery Sa PharmD, BCPS 04/22/2021 1:38 PM

## 2021-04-23 ENCOUNTER — Encounter: Payer: Self-pay | Admitting: Internal Medicine

## 2021-04-23 ENCOUNTER — Other Ambulatory Visit: Payer: Self-pay

## 2021-04-23 DIAGNOSIS — G40209 Localization-related (focal) (partial) symptomatic epilepsy and epileptic syndromes with complex partial seizures, not intractable, without status epilepticus: Secondary | ICD-10-CM

## 2021-04-23 DIAGNOSIS — G9341 Metabolic encephalopathy: Secondary | ICD-10-CM

## 2021-04-23 LAB — CBC
HCT: 29.2 % — ABNORMAL LOW (ref 36.0–46.0)
Hemoglobin: 9.3 g/dL — ABNORMAL LOW (ref 12.0–15.0)
MCH: 27.8 pg (ref 26.0–34.0)
MCHC: 31.8 g/dL (ref 30.0–36.0)
MCV: 87.4 fL (ref 80.0–100.0)
Platelets: 216 10*3/uL (ref 150–400)
RBC: 3.34 MIL/uL — ABNORMAL LOW (ref 3.87–5.11)
RDW: 16.9 % — ABNORMAL HIGH (ref 11.5–15.5)
WBC: 10.3 10*3/uL (ref 4.0–10.5)
nRBC: 0 % (ref 0.0–0.2)

## 2021-04-23 LAB — GLUCOSE, CAPILLARY
Glucose-Capillary: 168 mg/dL — ABNORMAL HIGH (ref 70–99)
Glucose-Capillary: 188 mg/dL — ABNORMAL HIGH (ref 70–99)
Glucose-Capillary: 153 mg/dL — ABNORMAL HIGH (ref 70–99)
Glucose-Capillary: 144 mg/dL — ABNORMAL HIGH (ref 70–99)
Glucose-Capillary: 192 mg/dL — ABNORMAL HIGH (ref 70–99)

## 2021-04-23 LAB — BASIC METABOLIC PANEL
Anion gap: 11 (ref 5–15)
BUN: 44 mg/dL — ABNORMAL HIGH (ref 8–23)
CO2: 25 mmol/L (ref 22–32)
Calcium: 8.3 mg/dL — ABNORMAL LOW (ref 8.9–10.3)
Chloride: 98 mmol/L (ref 98–111)
Creatinine, Ser: 2.12 mg/dL — ABNORMAL HIGH (ref 0.44–1.00)
GFR, Estimated: 26 mL/min — ABNORMAL LOW (ref >60)
Glucose, Bld: 202 mg/dL — ABNORMAL HIGH (ref 70–99)
Potassium: 4.5 mmol/L (ref 3.5–5.1)
Sodium: 134 mmol/L — ABNORMAL LOW (ref 135–145)

## 2021-04-23 LAB — PROCALCITONIN: Procalcitonin: 0.82 ng/mL

## 2021-04-23 LAB — OCCULT BLOOD X 1 CARD TO LAB, STOOL: Fecal Occult Bld: POSITIVE — AB

## 2021-04-23 LAB — BPAM RBC
Blood Product Expiration Date: 202302132359
ISSUE DATE / TIME: 202302061322
Unit Type and Rh: 600

## 2021-04-23 LAB — TYPE AND SCREEN
ABO/RH(D): A POS
Antibody Screen: NEGATIVE
Unit division: 0

## 2021-04-23 LAB — HEPARIN LEVEL (UNFRACTIONATED): Heparin Unfractionated: 0.29 IU/mL — ABNORMAL LOW (ref 0.30–0.70)

## 2021-04-23 MED ORDER — CARVEDILOL 6.25 MG PO TABS
3.1250 mg | ORAL_TABLET | Freq: Two times a day (BID) | ORAL | Status: DC
  Administered 2021-04-23 – 2021-04-25 (×4): 3.125 mg via ORAL
  Filled 2021-04-23 (×4): qty 1

## 2021-04-23 MED ORDER — POLYETHYLENE GLYCOL 3350 17 GM/SCOOP PO POWD
1.0000 | Freq: Once | ORAL | Status: AC
  Administered 2021-04-23: 255 g via ORAL
  Filled 2021-04-23: qty 255

## 2021-04-23 MED ORDER — HEPARIN (PORCINE) 25000 UT/250ML-% IV SOLN
1500.0000 [IU]/h | INTRAVENOUS | Status: DC
  Administered 2021-04-23: 1500 [IU]/h via INTRAVENOUS

## 2021-04-23 NOTE — Progress Notes (Signed)
Patient has blood in her urine.  Urinalysis complete.  CBC was ordered.  Results in chart.  Will continue to monitor.   [name]

## 2021-04-23 NOTE — Progress Notes (Signed)
[facility] Gastroenterology Inpatient Progress Note    Subjective: Patient seen for f/u GI bleeding. Patient essentially ruled out for acute MI by Cardiology. Had episode of "maroon stool" this AM.  Objective: Vital signs in last 24 hours: Temp:  [98 F (36.7 C)-99.9 F (37.7 C)] 98 F (36.7 C) (02/07 1528) Pulse Rate:  [79-110] 79 (02/07 1528) Resp:  [16-20] 16 (02/07 1528) BP: (115-165)/(61-88) 125/61 (02/07 1528) SpO2:  [90 %-96 %] 96 % (02/07 1528) Blood pressure 125/61, pulse 79, temperature 98 F (36.7 C), temperature source Oral, resp. rate 16, height 5\' 5"  (1.651 m), weight 86.2 kg, SpO2 96 %.    Intake/Output from previous day: 02/06 0701 - 02/07 0700 In: 960 [Blood:960] Out: 1226 [Urine:1225; Stool:1]  Intake/Output this shift: No intake/output data recorded.   Gen: NAD. Appears comfortable.  HEENT: [place]/AT. PERRLA. Normal external ear exam.  Chest: CTA, no wheezes.  CV: RR nl S1, S2. No gallops.  Abd: soft, nt, nd. BS+  Ext: no edema. Pulses 2+Bilateral BKA  Neuro: Alert and oriented. Judgement appears normal. Nonfocal.   Lab Results: Results for orders placed or performed during the hospital encounter of 04/21/21 (from the past 24 hour(s))  Heparin level (unfractionated)     Status: Abnormal   Collection Time: 04/22/21  6:34 PM  Result Value Ref Range   Heparin Unfractionated 0.15 (L) 0.30 - 0.70 IU/mL  Urinalysis, Complete w Microscopic Urine, Random     Status: Abnormal   Collection Time: 04/22/21 10:51 PM  Result Value Ref Range   Color, Urine YELLOW YELLOW   APPearance CLEAR CLEAR   Specific Gravity, Urine <1.005 (L) 1.005 - 1.030   pH 5.0 5.0 - 8.0   Glucose, UA >1,000 (A) NEGATIVE mg/dL   Hgb urine dipstick LARGE (A) NEGATIVE   Bilirubin Urine NEGATIVE NEGATIVE   Ketones, ur NEGATIVE NEGATIVE mg/dL   Protein, ur 30 (A) NEGATIVE mg/dL   Nitrite NEGATIVE NEGATIVE   Leukocytes,Ua NEGATIVE NEGATIVE   Squamous Epithelial / LPF NONE SEEN  0 - 5   WBC, UA 0-5 0 - 5 WBC/hpf   RBC / HPF 0-5 0 - 5 RBC/hpf   Bacteria, UA RARE (A) NONE SEEN  Procalcitonin     Status: None   Collection Time: 04/23/21  4:04 AM  Result Value Ref Range   Procalcitonin 0.82 ng/mL  CBC     Status: Abnormal   Collection Time: 04/23/21  4:04 AM  Result Value Ref Range   WBC 10.3 4.0 - 10.5 K/uL   RBC 3.34 (L) 3.87 - 5.11 MIL/uL   Hemoglobin 9.3 (L) 12.0 - 15.0 g/dL   HCT 29.2 (L) 36.0 - 46.0 %   MCV 87.4 80.0 - 100.0 fL   MCH 27.8 26.0 - 34.0 pg   MCHC 31.8 30.0 - 36.0 g/dL   RDW 16.9 (H) 11.5 - 15.5 %   Platelets 216 150 - 400 K/uL   nRBC 0.0 0.0 - 0.2 %  Heparin level (unfractionated)     Status: Abnormal   Collection Time: 04/23/21  4:04 AM  Result Value Ref Range   Heparin Unfractionated 0.29 (L) 0.30 - 0.70 IU/mL  Basic metabolic panel     Status: Abnormal   Collection Time: 04/23/21  4:04 AM  Result Value Ref Range   Sodium 134 (L) 135 - 145 mmol/L   Potassium 4.5 3.5 - 5.1 mmol/L   Chloride 98 98 - 111 mmol/L   CO2 25 22 - 32 mmol/L  Glucose, Bld 202 (H) 70 - 99 mg/dL   BUN 44 (H) 8 - 23 mg/dL   Creatinine, Ser 2.12 (H) 0.44 - 1.00 mg/dL   Calcium 8.3 (L) 8.9 - 10.3 mg/dL   GFR, Estimated 26 (L) >60 mL/min   Anion gap 11 5 - 15  Glucose, capillary     Status: Abnormal   Collection Time: 04/23/21  8:34 AM  Result Value Ref Range   Glucose-Capillary 168 (H) 70 - 99 mg/dL  Occult blood card to lab, stool     Status: Abnormal   Collection Time: 04/23/21 11:36 AM  Result Value Ref Range   Fecal Occult Bld POSITIVE (A) NEGATIVE  Glucose, capillary     Status: Abnormal   Collection Time: 04/23/21 12:05 PM  Result Value Ref Range   Glucose-Capillary 188 (H) 70 - 99 mg/dL  Glucose, capillary     Status: Abnormal   Collection Time: 04/23/21  3:39 PM  Result Value Ref Range   Glucose-Capillary 153 (H) 70 - 99 mg/dL  Glucose, capillary     Status: Abnormal   Collection Time: 04/23/21  5:08 PM  Result Value Ref Range    Glucose-Capillary 144 (H) 70 - 99 mg/dL     Recent Labs    04/21/21 1737 04/22/21 0312 04/22/21 0516 04/23/21 0404  WBC 11.8*  --   --  10.3  HGB 8.4* 7.5* 7.9* 9.3*  HCT 28.2* 23.8* 25.3* 29.2*  PLT 211  --   --  216   BMET Recent Labs    04/21/21 1737 04/23/21 0404  NA 134* 134*  K 4.7 4.5  CL 96* 98  CO2 25 25  GLUCOSE 316* 202*  BUN 49* 44*  CREATININE 2.58* 2.12*  CALCIUM 8.5* 8.3*   LFT Recent Labs    04/21/21 1737  PROT 6.8  ALBUMIN 3.3*  AST 48*  ALT 29  ALKPHOS 52  BILITOT 0.7   PT/INR Recent Labs    04/21/21 1740  LABPROT 14.9  INR 1.2   Hepatitis Panel No results for input(s): HEPBSAG, HCVAB, HEPAIGM, HEPBIGM in the last 72 hours. C-Diff No results for input(s): CDIFFTOX in the last 72 hours. No results for input(s): CDIFFPCR in the last 72 hours.   Studies/Results: DG Chest 2 View  Result Date: 04/21/2021 CLINICAL DATA:  Right hip pain after a fall. Sweating, confusion, syncope EXAM: CHEST - 2 VIEW COMPARISON:  02/20/2021 FINDINGS: Postoperative changes in the mediastinum. Cardiac enlargement. Lungs are clear. No pleural effusions. No pneumothorax. Mediastinal contours appear intact. IMPRESSION: No active cardiopulmonary disease. Electronically Signed   By: Lucienne Capers M.D.   On: 04/21/2021 18:38   CT HEAD WO CONTRAST (5MM)  Result Date: 04/21/2021 CLINICAL DATA:  Syncope versus seizure.  Anticoagulated. EXAM: CT HEAD WITHOUT CONTRAST TECHNIQUE: Contiguous axial images were obtained from the base of the skull through the vertex without intravenous contrast. RADIATION DOSE REDUCTION: This exam was performed according to the departmental dose-optimization program which includes automated exposure control, adjustment of the mA and/or kV according to patient size and/or use of iterative reconstruction technique. COMPARISON:  02/19/2021 FINDINGS: Brain: Generalized atrophy. Chronic small-vessel ischemic changes of the cerebral hemispheric white  matter. No sign of acute infarction, mass lesion, hemorrhage, hydrocephalus or extra-axial collection. Vascular: There is atherosclerotic calcification of the major vessels at the base of the brain. Skull: Negative Sinuses/Orbits: Clear except for a retention cyst in the sphenoid sinus. Orbits negative. Other: None IMPRESSION: No acute finding. Volume loss. Chronic small-vessel  ischemic changes of the white matter. Atherosclerotic calcification of the major vessels at the base of the brain. Electronically Signed   By: Nelson Chimes M.D.   On: 04/21/2021 18:03   ECHOCARDIOGRAM COMPLETE  Result Date: 04/22/2021    ECHOCARDIOGRAM REPORT   Patient Name:   MCKYNLIE VANDERSLICE Date of Exam: 04/22/2021 Medical Rec #:  [id number]        Height:       65.0 in Accession #:    [id number]       Weight:       190.0 lb Date of Birth:  09-13-1955         BSA:          1.935 m Patient Age:    46 years         BP:           119/69 mmHg Patient Gender: F                HR:           86 bpm. Exam Location:  [facility] Procedure: 2D Echo, Color Doppler, Cardiac Doppler and Intracardiac            Opacification Agent Indications:     R55 Syncope  History:         Patient has prior history of Echocardiogram examinations, most                  recent 01/30/2021. CHF, CAD, Prior CABG, PVD; Risk                  Factors:Dyslipidemia and Diabetes.  Sonographer:     Charmayne Sheer Referring Phys:  [id number] Athena Masse Diagnosing Phys: Kathlyn Sacramento MD  Sonographer Comments: Suboptimal apical window and no subcostal window. IMPRESSIONS  1. Left ventricular ejection fraction, by estimation, is 35 to 40%. The left ventricle has moderately decreased function. Left ventricular endocardial border not optimally defined to evaluate regional wall motion. There is mild left ventricular hypertrophy. Left ventricular diastolic parameters are indeterminate.  2. Right ventricular systolic function is normal. The right ventricular size is normal. There is mildly  elevated pulmonary artery systolic pressure.  3. Left atrial size was moderately dilated.  4. The mitral valve is normal in structure. No evidence of mitral valve regurgitation. Mild to moderate mitral stenosis. The mean mitral valve gradient is 5.0 mmHg. Severe mitral annular calcification.  5. The aortic valve is calcified. Aortic valve regurgitation is not visualized. Moderate to severe aortic valve stenosis. Aortic valve mean gradient measures 24.0 mmHg. Aortic valve is not well seen. Possible low flow-low gradient severe aortic stenosis. FINDINGS  Left Ventricle: Left ventricular ejection fraction, by estimation, is 35 to 40%. The left ventricle has moderately decreased function. Left ventricular endocardial border not optimally defined to evaluate regional wall motion. Definity contrast agent was given IV to delineate the left ventricular endocardial borders. The left ventricular internal cavity size was normal in size. There is mild left ventricular hypertrophy. Left ventricular diastolic parameters are indeterminate. Right Ventricle: The right ventricular size is normal. No increase in right ventricular wall thickness. Right ventricular systolic function is normal. There is mildly elevated pulmonary artery systolic pressure. The tricuspid regurgitant velocity is 3.03  m/s, and with an assumed right atrial pressure of 5 mmHg, the estimated right ventricular systolic pressure is 01.6 mmHg. Left Atrium: Left atrial size was moderately dilated. Right Atrium: Right atrial size was normal in size. Pericardium: There is  no evidence of pericardial effusion. Mitral Valve: The mitral valve is normal in structure. Severe mitral annular calcification. No evidence of mitral valve regurgitation. Mild to moderate mitral valve stenosis. MV peak gradient, 9.1 mmHg. The mean mitral valve gradient is 5.0 mmHg. Tricuspid Valve: The tricuspid valve is normal in structure. Tricuspid valve regurgitation is mild . No evidence of  tricuspid stenosis. Aortic Valve: The aortic valve is calcified. Aortic valve regurgitation is not visualized. Moderate to severe aortic stenosis is present. Aortic valve mean gradient measures 24.0 mmHg. Aortic valve peak gradient measures 37.9 mmHg. Aortic valve area, by VTI measures 0.91 cm. Pulmonic Valve: The pulmonic valve was normal in structure. Pulmonic valve regurgitation is mild. No evidence of pulmonic stenosis. Aorta: The aortic root is normal in size and structure. Venous: The inferior vena cava was not well visualized. IAS/Shunts: No atrial level shunt detected by color flow Doppler.  LEFT VENTRICLE PLAX 2D LVIDd:         3.49 cm   Diastology LVIDs:         2.85 cm   LV e' medial:    3.59 cm/s LV PW:         1.05 cm   LV E/e' medial:  43.7 LV IVS:        0.80 cm   LV e' lateral:   6.09 cm/s LVOT diam:     2.00 cm   LV E/e' lateral: 25.8 LV SV:         47 LV SV Index:   24 LVOT Area:     3.14 cm  LEFT ATRIUM           Index LA diam:      4.50 cm 2.33 cm/m LA Vol (A4C): 67.7 ml 34.98 ml/m  AORTIC VALVE                     PULMONIC VALVE AV Area (Vmax):    0.79 cm      PV Vmax:       1.05 m/s AV Area (Vmean):   0.93 cm      PV Vmean:      69.500 cm/s AV Area (VTI):     0.91 cm      PV VTI:        0.177 m AV Vmax:           308.00 cm/s   PV Peak grad:  4.4 mmHg AV Vmean:          183.200 cm/s  PV Mean grad:  2.0 mmHg AV VTI:            0.516 m AV Peak Grad:      37.9 mmHg AV Mean Grad:      24.0 mmHg LVOT Vmax:         77.70 cm/s LVOT Vmean:        54.400 cm/s LVOT VTI:          0.149 m LVOT/AV VTI ratio: 0.29  AORTA Ao Root diam: 2.30 cm MITRAL VALVE                TRICUSPID VALVE MV Area (PHT): 3.77 cm     TR Peak grad:   36.7 mmHg MV Area VTI:   1.19 cm     TR Vmax:        303.00 cm/s MV Peak grad:  9.1 mmHg MV Mean grad:  5.0 mmHg     SHUNTS MV Vmax:  1.51 m/s     Systemic VTI:  0.15 m MV Vmean:      107.0 cm/s   Systemic Diam: 2.00 cm MV Decel Time: 201 msec MV E velocity: 157.00 cm/s  MV A velocity: 140.00 cm/s MV E/A ratio:  1.12 Kathlyn Sacramento MD Electronically signed by Kathlyn Sacramento MD Signature Date/Time: 04/22/2021/10:31:39 AM    Final    DG Hip Unilat W or Wo Pelvis 2-3 Views Right  Result Date: 04/21/2021 CLINICAL DATA:  Right hip pain after a fall. EXAM: DG HIP (WITH OR WITHOUT PELVIS) 2-3V RIGHT COMPARISON:  Left femur 08/20/2018 FINDINGS: Degenerative changes in the lower lumbar spine and hips. Prior postoperative fixation of the left hip with short intramedullary rod and screw fixation. The right hip demonstrates no evidence of acute fracture or dislocation. There is cortical disruption and irregularity along the superior aspect of the right side of the symphysis pubis which possibly represents nondisplaced fracture. Symphysis pubis is not displaced. Visualized sacrum is intact. Vascular calcifications. IMPRESSION: 1. No evidence of acute fracture or displacement of the right hip. 2. Cortical irregularity consistent with nondisplaced fracture of the superior right pubic ramus. 3. Old fracture deformity of the left hip with postoperative fixation. Electronically Signed   By: Lucienne Capers M.D.   On: 04/21/2021 18:41    Scheduled Inpatient Medications:    carvedilol  3.125 mg Oral BID WC   empagliflozin  10 mg Oral Daily   ezetimibe  10 mg Oral Daily   insulin aspart  0-15 Units Subcutaneous TID WC   insulin glargine-yfgn  30 Units Subcutaneous QHS   levETIRAcetam  1,500 mg Oral QHS   rosuvastatin  40 mg Oral Daily   sertraline  100 mg Oral BID    Continuous Inpatient Infusions:    PRN Inpatient Medications:  acetaminophen, albuterol, ALPRAZolam, cycloSPORINE, lidocaine, nitroGLYCERIN, ondansetron (ZOFRAN) IV, pregabalin  Miscellaneous: NA  Assessment: Syncopal episode - Multiple possible causes (cardiac, neurological, polypharmacy). Anemia with drop in Hgb to 7.9 from baseline of around 9.5.  Hgb has dropped in the past to as low as 6.1 (2015).Currently  hemodynamically stable. Diastolic CHF. Elevated Troponin. ?Demand ischemia vs. Secondary to CRF. No symptoms of chest pain, EKG changes.   COVID-19 status:        Tested negative.   Recommendations:  1. Plan EGD and colonoscopy tomorrow. The patient understands the nature of the planned procedure, indications, risks, alternatives and potential complications including but not limited to bleeding, infection, perforation, damage to internal organs and possible oversedation/side effects from anesthesia. The patient agrees and gives consent to proceed.  Please refer to procedure notes for findings, recommendations and patient disposition/instructions. 2. Maintain clear liquid diet. 3. Serial H/H. 4. Procedures will be done ON Plavix. Patient told we will be unable remove polyps with active anticoagulation, mainly a diagnostic procedure.  Zyaire Mccleod K. Alice Reichert, M.D. 04/23/2021, 5:23 PM    Addendum:  Patient son against doing EGD. Will do colonoscopy ONLY tomorrow per patient and family wishes.   Robet Leu, M.D. ABIM Diplomate in Gastroenterology [place]

## 2021-04-23 NOTE — Consult Note (Signed)
ANTICOAGULATION CONSULT NOTE  Pharmacy Consult for heparin gtt Indication: chest pain/ACS  Patient Measurements: Height: 5\' 5"  (165.1 cm) Weight: 86.2 kg (190 lb) IBW/kg (Calculated) : 57 Heparin Dosing Weight: 75.7kg  Vital Signs: Temp: 99.9 F (37.7 C) (02/07 0414) Temp Source: Oral (02/07 0414) BP: 150/70 (02/07 0414) Pulse Rate: 105 (02/07 0414)  Labs: Recent Labs    04/21/21 1737 04/21/21 1740 04/21/21 1956 04/22/21 0312 04/22/21 0516 04/22/21 1834 04/23/21 0404  HGB 8.4*  --   --  7.5* 7.9*  --  9.3*  HCT 28.2*  --   --  23.8* 25.3*  --  29.2*  PLT 211  --   --   --   --   --  216  APTT  --  30  --   --   --   --   --   LABPROT  --  14.9  --   --   --   --   --   INR  --  1.2  --   --   --   --   --   HEPARINUNFRC  --   --   --   --  0.20* 0.15* 0.29*  CREATININE 2.58*  --   --   --   --   --  2.12*  TROPONINIHS 876*  --  822*  --   --   --   --      Estimated Creatinine Clearance: 29.5 mL/min (A) (by C-G formula based on SCr of 2.12 mg/dL (H)).   Medical History: Past Medical History:  Diagnosis Date   Acute congestive heart failure 03/29/2018   Acute encephalopathy 09/02/2013   Anemia, normocytic normochromic 03/14/2008   Arthritis    "hands"   Atherosclerosis of native arteries of the extremities with ulceration 07/14/2013   Atherosclerosis of native artery of extremity with intermittent claudication 09/16/2011   CAD (coronary artery disease) of artery bypass graft 03/14/2008   Chronic kidney disease (CKD) 12/26/2015   stage 3 due to type 2 diabetes mellitus   Chronic pain syndrome 60/45/4098   Complication of anesthesia    pt felt like she had a hard time waking up after surgery in Feb. 2015.   Coronary artery disease involving native coronary artery without angina pectoris 09/25/2015   Essential hypertension 03/14/2008   Gastroesophageal reflux disease with esophagitis 07/09/2016   Generalized anxiety disorder with panic attacks    History of  one panic attack   GERD (gastroesophageal reflux disease)    takes Zantac   Hyperlipidemia, unspecified 03/14/2008   Localization-related symptomatic epilepsy and epileptic syndromes with complex partial seizures, not intractable, without status epilepticus 06/27/2014   Major depressive disorder 03/14/2008   Neuropathy    Osteomyelitis of ankle or foot, left, acute 06/17/2013   Peripheral vascular disease    PVD (peripheral vascular disease) 03/25/2012   Type 2 diabetes mellitus with diabetic mononeuropathy, with long-term current use of insulin 03/14/2008    Medications:  Scheduled:   aspirin EC  81 mg Oral Daily   clopidogrel  75 mg Oral Daily   empagliflozin  10 mg Oral Daily   ezetimibe  10 mg Oral Daily   insulin aspart  0-15 Units Subcutaneous TID WC   insulin aspart  0-5 Units Subcutaneous QHS   insulin glargine-yfgn  30 Units Subcutaneous QHS   levETIRAcetam  1,500 mg Oral QHS   rosuvastatin  40 mg Oral Daily   sertraline  100 mg Oral BID  Assessment: Patient admitted after syncopal episode. Noted PMH relevant for  HTN, HLD, DM, CKD, severe AS, s/p CABG and bilateral BKA, seizure disorder. Pharmacy consulted to manage heparin drip for ACS. Plan by cardiology is IV heparin x 48 hours  Goal of Therapy:  Heparin level 0.3-0.7 units/ml Monitor platelets by anticoagulation protocol: Yes   Plan:  Heparin level remains slightly subtherapeutic despite recent rate increase: Increase heparin infusion rate to 1500 units/hr  Check anti-Xa level in 8 hours after rate change Continue to monitor H&H and platelets  Renda Rolls, PharmD, [facility] 04/23/2021 5:53 AM

## 2021-04-23 NOTE — Progress Notes (Signed)
PROGRESS NOTE  [name]    DOB: 1955/08/18, 66 y.o.  QMV:[id number]  PCP: [name], MD   Code Status: Full Code   DOA: 04/21/2021   LOS: 2  Brief Narrative of Current Hospitalization  [name] is a 66 y.o. female with a PMH significant for PVD s/p bilateral BKA, CAD s/p CABG x4, DM with neuropathy, complex partial seizures on Keppra, HTN, chronic pain syndrome, severe aortic stenosis on echocardiogram in November 2022 with EF 60-65% (improved from prior of 35%). They presented from home to the ED on 04/21/2021 with syncopal episode. She endorses having several episodes of hematochezia/bloody tissue after Bms in the past few days.  In the ED, it was found that they had hypotension, hypoxia requiring 2L O2, hgb 8.4, elevated troponin to 876. They were treated with ACS rule out, EEG, echo, PE study.  Significant results were that her hgb decreased rapidly to 7.5 requiring transfusion and demonstrated severe AS on echo.  GI and cardiology were consulted.  Patient was admitted to medicine service for further workup and management of syncope as outlined in detail below.  04/23/21 -stable  Assessment & Plan  Principal Problem:   Syncope and collapse Active Problems:   Uncontrolled type 2 diabetes mellitus with hyperglycemia, with long-term current use of insulin (HCC)   Acute on chronic anemia , possible acute blood loss anemia   Essential hypertension   History of carotid endarterectomy, right   PVD (peripheral vascular disease) (HCC)   Localization-related symptomatic epilepsy and epileptic syndromes with complex partial seizures, not intractable, without status epilepticus ([place])   Chronic pain syndrome   High risk medication use   S/P CABG x 4   Severe aortic stenosis by prior echocardiogram   Acute metabolic encephalopathy   Acute respiratory failure with hypoxia ([place])   Fall at home, initial encounter   Closed fracture of pubic ramus, right, initial encounter  ([place])   NSTEMI (non-ST elevated myocardial infarction) ([place])   Acute kidney injury superimposed on chronic kidney disease IV (HCC)   Hypotension   Chronic diastolic CHF (congestive heart failure) (HCC)   Hematochezia   Pressure injury of skin   Syncope  Syncope- multifactorial as described below. symptomatic anemia given recent bloody Bms and significant decline in hgb and also known severe aortic stenosis as seen on echo.   Symptomatic anemia- significant decline in hgb, maybe partially dilutional with fluids given since admission but also had h/o recent rectal bleeding and no colonoscopy >20 years. Hgb 10.5 prior to admission>>>7.5 . Received 1unit pRBCs 2/6 and Hgb responded well to 9.3 today. Nurse reports maroon colored stool this am. - GI consulted, appreciate recs  Hypotension- resolved. - restarting home antihypertensives, per cardiology    NSTEMI (non-ST elevated myocardial infarction) ([place])   S/P CABG x 4  Troponin 284-132, likely demand ischemia from syncopal episode/hypotension/low volume state - per cardiology. - May need cath. May dc heparin gtt   Severe aortic stenosis by prior echocardiogram- discussion of TAVR has already been initiated outpatient. If low volume, could be exacerbated Cardiology consulted    Acute kidney injury superimposed on CKD IV- improved Monitor renal function and avoid nephrotoxins   Acute respiratory failure with hypoxia ([place])- resolved. ORA   Closed fracture of pubic ramus, right, initial encounter (HCC) Pain control PRN PT eval   Fall at home, initial encounter Suspect secondary to acute medical condition Treat acute medical condition PT eval   Uncontrolled type 2 diabetes mellitus with  hyperglycemia, with long-term current use of insulin (HCC)-patient previously refusing CBG monitoring and insulin but is now agreeable to long-acting Sliding scale insulin coverage if able Basal insulin   Acute metabolic encephalopathy- (present on  admission)- resolved Suspect secondary to hypotension, with decreased perfusion CT head nonacute     Localization-related symptomatic epilepsy and epileptic syndromes with complex partial seizures, not intractable, without status epilepticus ([place])- (present on admission) Continue Keppra   PVD (peripheral vascular disease) ([place])- (present on admission) Continue aspirin and statins.  Patient is s/p BKA   History of carotid endarterectomy, right Continue aspirin and statins   Chronic diastolic CHF (congestive heart failure) (HCC) Currently euvolemic to dry Chest x-ray clear but BNP elevated to the 500s Holding off on torsemide 60, Coreg and losartan due to low blood pressure Monitor closely for fluid overload in view of IV hydration with known severe aortic stenosis   Chronic pain syndrome- (present on admission) Continue Lyrica   Essential hypertension- (present on admission) Holding all antihypertensives due to hypotension  Depression/anxiety-  - continue home medications  DVT prophylaxis: heparin gtt   Diet:  Diet Orders (From admission, onward)     Start     Ordered   04/22/21 1705  Diet heart healthy/carb modified Room service appropriate? Yes; Fluid consistency: Thin  Diet effective now       Question Answer Comment  Diet-HS Snack? Nothing   Room service appropriate? Yes   Fluid consistency: Thin      04/22/21 1704            Subjective 04/23/21    Pt reports doing alright today. Concerned about what we are going to do about her Bms. Unable to clarify further. She is agreeing to [facility] monitoring and says "its a losing battle". No other complaints or concerns at this time.  Disposition Plan & Communication  Patient status: Inpatient  Admitted From: Home Disposition: TBD Anticipated discharge date: TBD  Family Communication: son and husband at bedside  Consults, Procedures, Significant Events  Consultants:  GI Cardiology   Procedures/significant events:   None  Antimicrobials:  Anti-infectives (From admission, onward)    None       Objective   Vitals:   04/22/21 1856 04/22/21 2059 04/22/21 2340 04/23/21 0414  BP: (!) 143/64 (!) 158/77 (!) 150/73 (!) 150/70  Pulse: 91 (!) 110 (!) 107 (!) 105  Resp: 17  20 20   Temp: 98.9 F (37.2 C)  99.4 F (37.4 C) 99.9 F (37.7 C)  TempSrc: Oral  Oral Oral  SpO2: 95% 96% 90% 93%  Weight:      Height:        Intake/Output Summary (Last 24 hours) at 04/23/2021 7026 Last data filed at 04/22/2021 2207 Gross per 24 hour  Intake 960 ml  Output 1226 ml  Net -266 ml    Filed Weights   04/21/21 1724  Weight: 86.2 kg    Patient BMI: Body mass index is 31.62 kg/m.   Physical Exam:  General: awake, alert, NAD HEENT: atraumatic, clear conjunctiva, anicteric sclera, MMM, hearing grossly normal Respiratory: normal respiratory effort. Cardiovascular: quick capillary refill  Nervous: A&O x3. no gross focal neurologic deficits, normal speech Extremities: Bilateral BKA Skin: dry, intact, normal temperature, normal color. No rashes, lesions or ulcers on exposed skin Psychiatry: normal mood, congruent affect  Labs   I have personally reviewed following labs and imaging studies CBC    Component Value Date/Time   WBC 10.3 04/23/2021 0404   RBC  3.34 (L) 04/23/2021 0404   HGB 9.3 (L) 04/23/2021 0404   HGB 9.3 (L) 01/31/2021 1655   HCT 29.2 (L) 04/23/2021 0404   HCT 28.2 (L) 01/31/2021 1655   PLT 216 04/23/2021 0404   PLT 163 01/31/2021 1655   MCV 87.4 04/23/2021 0404   MCV 87 01/31/2021 1655   MCH 27.8 04/23/2021 0404   MCHC 31.8 04/23/2021 0404   RDW 16.9 (H) 04/23/2021 0404   RDW 17.4 (H) 01/31/2021 1655   LYMPHSABS 1.0 04/21/2021 1737   MONOABS 0.5 04/21/2021 1737   EOSABS 0.3 04/21/2021 1737   BASOSABS 0.0 04/21/2021 1737   BMP Latest Ref Rng & Units 04/23/2021 04/21/2021 02/22/2021  Glucose 70 - 99 mg/dL 202(H) 316(H) 239(H)  BUN 8 - 23 mg/dL 44(H) 49(H) 57(H)  Creatinine 0.44 - 1.00  mg/dL 2.12(H) 2.58(H) 1.94(H)  BUN/Creat Ratio 12 - 28 - - -  Sodium 135 - 145 mmol/L 134(L) 134(L) 136  Potassium 3.5 - 5.1 mmol/L 4.5 4.7 4.0  Chloride 98 - 111 mmol/L 98 96(L) 97(L)  CO2 22 - 32 mmol/L 25 25 27   Calcium 8.9 - 10.3 mg/dL 8.3(L) 8.5(L) 8.5(L)   Imaging Studies  DG Chest 2 View  Result Date: 04/21/2021 CLINICAL DATA:  Right hip pain after a fall. Sweating, confusion, syncope EXAM: CHEST - 2 VIEW COMPARISON:  02/20/2021 FINDINGS: Postoperative changes in the mediastinum. Cardiac enlargement. Lungs are clear. No pleural effusions. No pneumothorax. Mediastinal contours appear intact. IMPRESSION: No active cardiopulmonary disease. Electronically Signed   By: Lucienne Capers M.D.   On: 04/21/2021 18:38   CT HEAD WO CONTRAST (5MM)  Result Date: 04/21/2021 CLINICAL DATA:  Syncope versus seizure.  Anticoagulated. EXAM: CT HEAD WITHOUT CONTRAST TECHNIQUE: Contiguous axial images were obtained from the base of the skull through the vertex without intravenous contrast. RADIATION DOSE REDUCTION: This exam was performed according to the departmental dose-optimization program which includes automated exposure control, adjustment of the mA and/or kV according to patient size and/or use of iterative reconstruction technique. COMPARISON:  02/19/2021 FINDINGS: Brain: Generalized atrophy. Chronic small-vessel ischemic changes of the cerebral hemispheric white matter. No sign of acute infarction, mass lesion, hemorrhage, hydrocephalus or extra-axial collection. Vascular: There is atherosclerotic calcification of the major vessels at the base of the brain. Skull: Negative Sinuses/Orbits: Clear except for a retention cyst in the sphenoid sinus. Orbits negative. Other: None IMPRESSION: No acute finding. Volume loss. Chronic small-vessel ischemic changes of the white matter. Atherosclerotic calcification of the major vessels at the base of the brain. Electronically Signed   By: Nelson Chimes M.D.   On:  04/21/2021 18:03   ECHOCARDIOGRAM COMPLETE  Result Date: 04/22/2021    ECHOCARDIOGRAM REPORT   Patient Name:   JIZELLE CONKEY Date of Exam: 04/22/2021 Medical Rec #:  [id number]        Height:       65.0 in Accession #:    [id number]       Weight:       190.0 lb Date of Birth:  [birth year]         BSA:          1.935 m Patient Age:    28 years         BP:           119/69 mmHg Patient Gender: F                HR:           86  bpm. Exam Location:  [facility] Procedure: 2D Echo, Color Doppler, Cardiac Doppler and Intracardiac            Opacification Agent Indications:     R55 Syncope  History:         Patient has prior history of Echocardiogram examinations, most                  recent 01/30/2021. CHF, CAD, Prior CABG, PVD; Risk                  Factors:Dyslipidemia and Diabetes.  Sonographer:     Charmayne Sheer Referring Phys:  [id number] Athena Masse Diagnosing Phys: Kathlyn Sacramento MD  Sonographer Comments: Suboptimal apical window and no subcostal window. IMPRESSIONS  1. Left ventricular ejection fraction, by estimation, is 35 to 40%. The left ventricle has moderately decreased function. Left ventricular endocardial border not optimally defined to evaluate regional wall motion. There is mild left ventricular hypertrophy. Left ventricular diastolic parameters are indeterminate.  2. Right ventricular systolic function is normal. The right ventricular size is normal. There is mildly elevated pulmonary artery systolic pressure.  3. Left atrial size was moderately dilated.  4. The mitral valve is normal in structure. No evidence of mitral valve regurgitation. Mild to moderate mitral stenosis. The mean mitral valve gradient is 5.0 mmHg. Severe mitral annular calcification.  5. The aortic valve is calcified. Aortic valve regurgitation is not visualized. Moderate to severe aortic valve stenosis. Aortic valve mean gradient measures 24.0 mmHg. Aortic valve is not well seen. Possible low flow-low gradient severe aortic stenosis.  FINDINGS  Left Ventricle: Left ventricular ejection fraction, by estimation, is 35 to 40%. The left ventricle has moderately decreased function. Left ventricular endocardial border not optimally defined to evaluate regional wall motion. Definity contrast agent was given IV to delineate the left ventricular endocardial borders. The left ventricular internal cavity size was normal in size. There is mild left ventricular hypertrophy. Left ventricular diastolic parameters are indeterminate. Right Ventricle: The right ventricular size is normal. No increase in right ventricular wall thickness. Right ventricular systolic function is normal. There is mildly elevated pulmonary artery systolic pressure. The tricuspid regurgitant velocity is 3.03  m/s, and with an assumed right atrial pressure of 5 mmHg, the estimated right ventricular systolic pressure is 19.3 mmHg. Left Atrium: Left atrial size was moderately dilated. Right Atrium: Right atrial size was normal in size. Pericardium: There is no evidence of pericardial effusion. Mitral Valve: The mitral valve is normal in structure. Severe mitral annular calcification. No evidence of mitral valve regurgitation. Mild to moderate mitral valve stenosis. MV peak gradient, 9.1 mmHg. The mean mitral valve gradient is 5.0 mmHg. Tricuspid Valve: The tricuspid valve is normal in structure. Tricuspid valve regurgitation is mild . No evidence of tricuspid stenosis. Aortic Valve: The aortic valve is calcified. Aortic valve regurgitation is not visualized. Moderate to severe aortic stenosis is present. Aortic valve mean gradient measures 24.0 mmHg. Aortic valve peak gradient measures 37.9 mmHg. Aortic valve area, by VTI measures 0.91 cm. Pulmonic Valve: The pulmonic valve was normal in structure. Pulmonic valve regurgitation is mild. No evidence of pulmonic stenosis. Aorta: The aortic root is normal in size and structure. Venous: The inferior vena cava was not well visualized.  IAS/Shunts: No atrial level shunt detected by color flow Doppler.  LEFT VENTRICLE PLAX 2D LVIDd:         3.49 cm   Diastology LVIDs:         2.85  cm   LV e' medial:    3.59 cm/s LV PW:         1.05 cm   LV E/e' medial:  43.7 LV IVS:        0.80 cm   LV e' lateral:   6.09 cm/s LVOT diam:     2.00 cm   LV E/e' lateral: 25.8 LV SV:         47 LV SV Index:   24 LVOT Area:     3.14 cm  LEFT ATRIUM           Index LA diam:      4.50 cm 2.33 cm/m LA Vol (A4C): 67.7 ml 34.98 ml/m  AORTIC VALVE                     PULMONIC VALVE AV Area (Vmax):    0.79 cm      PV Vmax:       1.05 m/s AV Area (Vmean):   0.93 cm      PV Vmean:      69.500 cm/s AV Area (VTI):     0.91 cm      PV VTI:        0.177 m AV Vmax:           308.00 cm/s   PV Peak grad:  4.4 mmHg AV Vmean:          183.200 cm/s  PV Mean grad:  2.0 mmHg AV VTI:            0.516 m AV Peak Grad:      37.9 mmHg AV Mean Grad:      24.0 mmHg LVOT Vmax:         77.70 cm/s LVOT Vmean:        54.400 cm/s LVOT VTI:          0.149 m LVOT/AV VTI ratio: 0.29  AORTA Ao Root diam: 2.30 cm MITRAL VALVE                TRICUSPID VALVE MV Area (PHT): 3.77 cm     TR Peak grad:   36.7 mmHg MV Area VTI:   1.19 cm     TR Vmax:        303.00 cm/s MV Peak grad:  9.1 mmHg MV Mean grad:  5.0 mmHg     SHUNTS MV Vmax:       1.51 m/s     Systemic VTI:  0.15 m MV Vmean:      107.0 cm/s   Systemic Diam: 2.00 cm MV Decel Time: 201 msec MV E velocity: 157.00 cm/s MV A velocity: 140.00 cm/s MV E/A ratio:  1.12 Kathlyn Sacramento MD Electronically signed by Kathlyn Sacramento MD Signature Date/Time: 04/22/2021/10:31:39 AM    Final    DG Hip Unilat W or Wo Pelvis 2-3 Views Right  Result Date: 04/21/2021 CLINICAL DATA:  Right hip pain after a fall. EXAM: DG HIP (WITH OR WITHOUT PELVIS) 2-3V RIGHT COMPARISON:  Left femur 08/20/2018 FINDINGS: Degenerative changes in the lower lumbar spine and hips. Prior postoperative fixation of the left hip with short intramedullary rod and screw fixation. The right hip  demonstrates no evidence of acute fracture or dislocation. There is cortical disruption and irregularity along the superior aspect of the right side of the symphysis pubis which possibly represents nondisplaced fracture. Symphysis pubis is not displaced. Visualized sacrum is intact. Vascular calcifications. IMPRESSION: 1. No evidence of acute  fracture or displacement of the right hip. 2. Cortical irregularity consistent with nondisplaced fracture of the superior right pubic ramus. 3. Old fracture deformity of the left hip with postoperative fixation. Electronically Signed   By: Lucienne Capers M.D.   On: 04/21/2021 18:41    Medications   Scheduled Meds:  aspirin EC  81 mg Oral Daily   clopidogrel  75 mg Oral Daily   empagliflozin  10 mg Oral Daily   ezetimibe  10 mg Oral Daily   insulin aspart  0-15 Units Subcutaneous TID WC   insulin aspart  0-5 Units Subcutaneous QHS   insulin glargine-yfgn  30 Units Subcutaneous QHS   levETIRAcetam  1,500 mg Oral QHS   rosuvastatin  40 mg Oral Daily   sertraline  100 mg Oral BID   No recently discontinued medications to reconcile  LOS: 2 days   Richarda Osmond, DO [facility] 04/23/2021, 7:12 AM   Available by Epic secure chat 7AM-7PM. If 7PM-7AM, please contact night-coverage Refer to [email or web address] to contact the [facility] Attending or Consulting provider for this pt

## 2021-04-23 NOTE — Progress Notes (Signed)
°  ° °      CROSS COVER NOTE  NAME: [name] MRN: [id number] DOB : 04-Oct-1955   Secure chat from RN reporting pink tinged urine. UA sent and shows "Large" HGB Urine Dipstick. On my bedside evaluation patient has voided again and there is clear, yellow urine in the suction canister. Patient denies dysuria. UA nitrite and Leuk negative. Will not stop heparin at this time. RN to secure chat if patient has additional pink tinged or red urine output.   Neomia Glass MHA, MSN, FNP-BC Nurse Practitioner [facility] Pager [phone]

## 2021-04-23 NOTE — Progress Notes (Signed)
Progress Note  Patient Name: [name] Date of Encounter: 04/23/2021  [facility] Cardiologist: Minus Breeding, MD   Subjective   Hgb improved s/p 1 unit PRBCs. Patient says she is overall feeling better. BP better today. Kidney function improved.   Inpatient Medications    Scheduled Meds:  aspirin EC  81 mg Oral Daily   clopidogrel  75 mg Oral Daily   empagliflozin  10 mg Oral Daily   ezetimibe  10 mg Oral Daily   insulin aspart  0-15 Units Subcutaneous TID WC   insulin aspart  0-5 Units Subcutaneous QHS   insulin glargine-yfgn  30 Units Subcutaneous QHS   levETIRAcetam  1,500 mg Oral QHS   rosuvastatin  40 mg Oral Daily   sertraline  100 mg Oral BID   Continuous Infusions:  heparin 1,500 Units/hr (04/23/21 0635)   PRN Meds: acetaminophen, albuterol, ALPRAZolam, cycloSPORINE, lidocaine, nitroGLYCERIN, ondansetron (ZOFRAN) IV, pregabalin   Vital Signs    Vitals:   04/22/21 2059 04/22/21 2340 04/23/21 0414 04/23/21 0830  BP: (!) 158/77 (!) 150/73 (!) 150/70 (!) 165/82  Pulse: (!) 110 (!) 107 (!) 105 (!) 102  Resp:  20 20 20   Temp:  99.4 F (37.4 C) 99.9 F (37.7 C) 98.8 F (37.1 C)  TempSrc:  Oral Oral   SpO2: 96% 90% 93% 92%  Weight:      Height:        Intake/Output Summary (Last 24 hours) at 04/23/2021 0908 Last data filed at 04/22/2021 2207 Gross per 24 hour  Intake 960 ml  Output 1226 ml  Net -266 ml   Last 3 Weights 04/21/2021 02/21/2021 02/19/2021  Weight (lbs) 190 lb 181 lb 7 oz 181 lb 3.2 oz  Weight (kg) 86.183 kg 82.3 kg 82.192 kg      Telemetry    SR, PACs, HR around 100bpm - Personally Reviewed  ECG    No new - Personally Reviewed  Physical Exam   GEN: No acute distress.   Neck: No JVD Cardiac: RRR, + murmur, np rubs, or gallops.  Respiratory: Clear to auscultation bilaterally. GI: Soft, nontender, non-distended  MS: No edema; No deformity. Neuro:  Nonfocal  Psych: Normal affect   Labs    High Sensitivity Troponin:    Recent Labs  Lab 04/21/21 1737 04/21/21 1956  TROPONINIHS 876* 822*     Chemistry Recent Labs  Lab 04/21/21 1737 04/23/21 0404  NA 134* 134*  K 4.7 4.5  CL 96* 98  CO2 25 25  GLUCOSE 316* 202*  BUN 49* 44*  CREATININE 2.58* 2.12*  CALCIUM 8.5* 8.3*  PROT 6.8  --   ALBUMIN 3.3*  --   AST 48*  --   ALT 29  --   ALKPHOS 52  --   BILITOT 0.7  --   GFRNONAA 20* 26*  ANIONGAP 13 11    Lipids No results for input(s): CHOL, TRIG, HDL, LABVLDL, LDLCALC, CHOLHDL in the last 168 hours.  Hematology Recent Labs  Lab 04/21/21 1737 04/22/21 0312 04/22/21 0516 04/23/21 0404  WBC 11.8*  --   --  10.3  RBC 3.10*  --   --  3.34*  HGB 8.4* 7.5* 7.9* 9.3*  HCT 28.2* 23.8* 25.3* 29.2*  MCV 91.0  --   --  87.4  MCH 27.1  --   --  27.8  MCHC 29.8*  --   --  31.8  RDW 17.0*  --   --  16.9*  PLT 211  --   --  216   Thyroid No results for input(s): TSH, FREET4 in the last 168 hours.  BNP Recent Labs  Lab 04/21/21 1755  BNP 583.7*    DDimer  Recent Labs  Lab 04/21/21 1756  DDIMER 18.50*     Radiology    DG Chest 2 View  Result Date: 04/21/2021 CLINICAL DATA:  Right hip pain after a fall. Sweating, confusion, syncope EXAM: CHEST - 2 VIEW COMPARISON:  02/20/2021 FINDINGS: Postoperative changes in the mediastinum. Cardiac enlargement. Lungs are clear. No pleural effusions. No pneumothorax. Mediastinal contours appear intact. IMPRESSION: No active cardiopulmonary disease. Electronically Signed   By: Lucienne Capers M.D.   On: 04/21/2021 18:38   CT HEAD WO CONTRAST (5MM)  Result Date: 04/21/2021 CLINICAL DATA:  Syncope versus seizure.  Anticoagulated. EXAM: CT HEAD WITHOUT CONTRAST TECHNIQUE: Contiguous axial images were obtained from the base of the skull through the vertex without intravenous contrast. RADIATION DOSE REDUCTION: This exam was performed according to the departmental dose-optimization program which includes automated exposure control, adjustment of the mA and/or kV  according to patient size and/or use of iterative reconstruction technique. COMPARISON:  02/19/2021 FINDINGS: Brain: Generalized atrophy. Chronic small-vessel ischemic changes of the cerebral hemispheric white matter. No sign of acute infarction, mass lesion, hemorrhage, hydrocephalus or extra-axial collection. Vascular: There is atherosclerotic calcification of the major vessels at the base of the brain. Skull: Negative Sinuses/Orbits: Clear except for a retention cyst in the sphenoid sinus. Orbits negative. Other: None IMPRESSION: No acute finding. Volume loss. Chronic small-vessel ischemic changes of the white matter. Atherosclerotic calcification of the major vessels at the base of the brain. Electronically Signed   By: Nelson Chimes M.D.   On: 04/21/2021 18:03   ECHOCARDIOGRAM COMPLETE  Result Date: 04/22/2021    ECHOCARDIOGRAM REPORT   Patient Name:   [name] Date of Exam: 04/22/2021 Medical Rec #:  [id number]        Height:       65.0 in Accession #:    [id number]       Weight:       190.0 lb Date of Birth:  Dec 07, 1955         BSA:          1.935 m Patient Age:    66 years         BP:           119/69 mmHg Patient Gender: F                HR:           86 bpm. Exam Location:  [facility] Procedure: 2D Echo, Color Doppler, Cardiac Doppler and Intracardiac            Opacification Agent Indications:     R55 Syncope  History:         Patient has prior history of Echocardiogram examinations, most                  recent 01/30/2021. CHF, CAD, Prior CABG, PVD; Risk                  Factors:Dyslipidemia and Diabetes.  Sonographer:     Charmayne Sheer Referring Phys:  [id number] Athena Masse Diagnosing Phys: Kathlyn Sacramento MD  Sonographer Comments: Suboptimal apical window and no subcostal window. IMPRESSIONS  1. Left ventricular ejection fraction, by estimation, is 35 to 40%. The left ventricle has moderately decreased function. Left ventricular endocardial border not optimally defined to evaluate  regional wall motion.  There is mild left ventricular hypertrophy. Left ventricular diastolic parameters are indeterminate.  2. Right ventricular systolic function is normal. The right ventricular size is normal. There is mildly elevated pulmonary artery systolic pressure.  3. Left atrial size was moderately dilated.  4. The mitral valve is normal in structure. No evidence of mitral valve regurgitation. Mild to moderate mitral stenosis. The mean mitral valve gradient is 5.0 mmHg. Severe mitral annular calcification.  5. The aortic valve is calcified. Aortic valve regurgitation is not visualized. Moderate to severe aortic valve stenosis. Aortic valve mean gradient measures 24.0 mmHg. Aortic valve is not well seen. Possible low flow-low gradient severe aortic stenosis. FINDINGS  Left Ventricle: Left ventricular ejection fraction, by estimation, is 35 to 40%. The left ventricle has moderately decreased function. Left ventricular endocardial border not optimally defined to evaluate regional wall motion. Definity contrast agent was given IV to delineate the left ventricular endocardial borders. The left ventricular internal cavity size was normal in size. There is mild left ventricular hypertrophy. Left ventricular diastolic parameters are indeterminate. Right Ventricle: The right ventricular size is normal. No increase in right ventricular wall thickness. Right ventricular systolic function is normal. There is mildly elevated pulmonary artery systolic pressure. The tricuspid regurgitant velocity is 3.03  m/s, and with an assumed right atrial pressure of 5 mmHg, the estimated right ventricular systolic pressure is 81.1 mmHg. Left Atrium: Left atrial size was moderately dilated. Right Atrium: Right atrial size was normal in size. Pericardium: There is no evidence of pericardial effusion. Mitral Valve: The mitral valve is normal in structure. Severe mitral annular calcification. No evidence of mitral valve regurgitation. Mild to moderate mitral  valve stenosis. MV peak gradient, 9.1 mmHg. The mean mitral valve gradient is 5.0 mmHg. Tricuspid Valve: The tricuspid valve is normal in structure. Tricuspid valve regurgitation is mild . No evidence of tricuspid stenosis. Aortic Valve: The aortic valve is calcified. Aortic valve regurgitation is not visualized. Moderate to severe aortic stenosis is present. Aortic valve mean gradient measures 24.0 mmHg. Aortic valve peak gradient measures 37.9 mmHg. Aortic valve area, by VTI measures 0.91 cm. Pulmonic Valve: The pulmonic valve was normal in structure. Pulmonic valve regurgitation is mild. No evidence of pulmonic stenosis. Aorta: The aortic root is normal in size and structure. Venous: The inferior vena cava was not well visualized. IAS/Shunts: No atrial level shunt detected by color flow Doppler.  LEFT VENTRICLE PLAX 2D LVIDd:         3.49 cm   Diastology LVIDs:         2.85 cm   LV e' medial:    3.59 cm/s LV PW:         1.05 cm   LV E/e' medial:  43.7 LV IVS:        0.80 cm   LV e' lateral:   6.09 cm/s LVOT diam:     2.00 cm   LV E/e' lateral: 25.8 LV SV:         47 LV SV Index:   24 LVOT Area:     3.14 cm  LEFT ATRIUM           Index LA diam:      4.50 cm 2.33 cm/m LA Vol (A4C): 67.7 ml 34.98 ml/m  AORTIC VALVE                     PULMONIC VALVE AV Area (Vmax):    0.79 cm  PV Vmax:       1.05 m/s AV Area (Vmean):   0.93 cm      PV Vmean:      69.500 cm/s AV Area (VTI):     0.91 cm      PV VTI:        0.177 m AV Vmax:           308.00 cm/s   PV Peak grad:  4.4 mmHg AV Vmean:          183.200 cm/s  PV Mean grad:  2.0 mmHg AV VTI:            0.516 m AV Peak Grad:      37.9 mmHg AV Mean Grad:      24.0 mmHg LVOT Vmax:         77.70 cm/s LVOT Vmean:        54.400 cm/s LVOT VTI:          0.149 m LVOT/AV VTI ratio: 0.29  AORTA Ao Root diam: 2.30 cm MITRAL VALVE                TRICUSPID VALVE MV Area (PHT): 3.77 cm     TR Peak grad:   36.7 mmHg MV Area VTI:   1.19 cm     TR Vmax:        303.00 cm/s MV Peak  grad:  9.1 mmHg MV Mean grad:  5.0 mmHg     SHUNTS MV Vmax:       1.51 m/s     Systemic VTI:  0.15 m MV Vmean:      107.0 cm/s   Systemic Diam: 2.00 cm MV Decel Time: 201 msec MV E velocity: 157.00 cm/s MV A velocity: 140.00 cm/s MV E/A ratio:  1.12 Kathlyn Sacramento MD Electronically signed by Kathlyn Sacramento MD Signature Date/Time: 04/22/2021/10:31:39 AM    Final    DG Hip Unilat W or Wo Pelvis 2-3 Views Right  Result Date: 04/21/2021 CLINICAL DATA:  Right hip pain after a fall. EXAM: DG HIP (WITH OR WITHOUT PELVIS) 2-3V RIGHT COMPARISON:  Left femur 08/20/2018 FINDINGS: Degenerative changes in the lower lumbar spine and hips. Prior postoperative fixation of the left hip with short intramedullary rod and screw fixation. The right hip demonstrates no evidence of acute fracture or dislocation. There is cortical disruption and irregularity along the superior aspect of the right side of the symphysis pubis which possibly represents nondisplaced fracture. Symphysis pubis is not displaced. Visualized sacrum is intact. Vascular calcifications. IMPRESSION: 1. No evidence of acute fracture or displacement of the right hip. 2. Cortical irregularity consistent with nondisplaced fracture of the superior right pubic ramus. 3. Old fracture deformity of the left hip with postoperative fixation. Electronically Signed   By: Lucienne Capers M.D.   On: 04/21/2021 18:41    Cardiac Studies   Echo 04/22/21 1. Left ventricular ejection fraction, by estimation, is 35 to 40%. The  left ventricle has moderately decreased function. Left ventricular  endocardial border not optimally defined to evaluate regional wall motion.  There is mild left ventricular  hypertrophy. Left ventricular diastolic parameters are indeterminate.   2. Right ventricular systolic function is normal. The right ventricular  size is normal. There is mildly elevated pulmonary artery systolic  pressure.   3. Left atrial size was moderately dilated.   4. The  mitral valve is normal in structure. No evidence of mitral valve  regurgitation. Mild to moderate mitral stenosis. The mean  mitral valve  gradient is 5.0 mmHg. Severe mitral annular calcification.   5. The aortic valve is calcified. Aortic valve regurgitation is not  visualized. Moderate to severe aortic valve stenosis. Aortic valve mean  gradient measures 24.0 mmHg. Aortic valve is not well seen. Possible low  flow-low gradient severe aortic  stenosis.    Echo 01/30/22 1. Left ventricular ejection fraction, by estimation, is 60 to 65%. The  left ventricle has normal function. The left ventricle has no regional  wall motion abnormalities. Left ventricular diastolic parameters are  indeterminate.   2. Right ventricular systolic function is normal. The right ventricular  size is normal.   3. Left atrial size was mildly dilated.   4. The mitral valve is degenerative. Mild to moderate mitral valve  regurgitation. Moderate mitral stenosis. Severe mitral annular  calcification.   5. The aortic valve is calcified. There is severe calcifcation of the  aortic valve. There is severe thickening of the aortic valve. Aortic valve  regurgitation is not visualized. Moderate to severe aortic valve stenosis.   6. The inferior vena cava is normal in size with greater than 50%  respiratory variability, suggesting right atrial pressure of 3 mmHg.   Comparison(s): Outside echocardiogram September 2022 demonstrated an EF of  30 to 35%. There was thinning and akinesis of the mid anterior, mid  anteroseptal, basal anterior and basal anteroseptal segments. There was  moderately thickened aortic valve with  severe aortic stenosis. There was mild mitral regurgitation.      Patient Profile     66 y.o. female with a hx of ith a hx of CABG x4 '08, diabetes, bilateral BKAs, carotid stenosis status post CEA '07, hypertension, hyperlipidemia, seizures and CKD  who is being seen 04/22/2021 for the evaluation of  syncope.  Assessment & Plan    Syncope -suspect multifactorial with severe AS, hypotension, diuretic therapy, anemia - s/p IVF - antihypertensives held - CT head was non-acute - tele with no arrhythmia, will likely need heart monitor as OP - Echo this admission showed reduced EF at 35-40%, mild LVH, normal. Has been reduced in the past - further work-up per IM   Severe AS - Echo 11/2020 showed LVEF 30-35% - Echo 01/2021 showed LVEF 60-65%, no WMA, mild to mod MR, mod to severe AS. (EF ,ay be overestimated) - Echo this admission showed LVEF 35-40%, mild LVH, normal RV function,mild to mod MD, mean gradient 58mmHg, mod to severe AS, mean gradient 70mmHG, possible low-flow severe AS - Dr. Percival Spanish was considering heart cath, but kidney function was limiting further work-up   Acute on chronic diastolic CHF -  Echo this admission showed reduced EF 35-40%. - BNP 583, CXR clear - does not appear significantly volume up on exam - PTA torsemide 20mg  daily>>would restart - restart PTA Coreg and losartan as able - continue Jardiance - may consider heart cath pending valve work-up   ?NSTEMI Elevated troponin CAD s/p remote CABG x4 - HS trop elevated to 876>>822 - No anginal symptoms reported - EKG with no ischemic changes - IV heparin x 48 hours, can possibly stop IV heparin, will discuss with MD - PTA Aspirin, plavix, Coreg, zetia, losartan crestor - plan as above   AKI on CKD stage 4 - Scr 2.58, BUN 49 on admission - kidney function improved with holding Torsemide - Baseline around 2 - she follows with nephrology as OP   Acute on chronic Anemia Hematochezia - Hgb 7.5 on admission - Hgb baseline around  9 - s/p 1 unit PRBC - GI consulted, plan for possible colonoscopy sometime as OP - daily CBC with IV heparin   Closed rt pubic ramus fracture - PR eval - per IM    For questions or updates, please contact [place] Please consult [email or web address] for contact info under         Signed, Shanyah Gattuso Ninfa Meeker, PA-C  04/23/2021, 9:08 AM

## 2021-04-24 ENCOUNTER — Inpatient Hospital Stay: Payer: Medicare Other | Admitting: Anesthesiology

## 2021-04-24 ENCOUNTER — Encounter: Payer: Self-pay | Admitting: Internal Medicine

## 2021-04-24 ENCOUNTER — Encounter
Admission: EM | Disposition: A | Discharge status: Home Health | Payer: Medicare Other | Source: Home / Self Care | Attending: Student in an Organized Health Care Education/Training Program

## 2021-04-24 DIAGNOSIS — I5020 Unspecified systolic (congestive) heart failure: Secondary | ICD-10-CM

## 2021-04-24 HISTORY — PX: COLONOSCOPY: SHX5424

## 2021-04-24 LAB — RENAL FUNCTION PANEL
Albumin: 3.2 g/dL — ABNORMAL LOW (ref 3.5–5.0)
Anion gap: 9 (ref 5–15)
BUN: 30 mg/dL — ABNORMAL HIGH (ref 8–23)
CO2: 26 mmol/L (ref 22–32)
Calcium: 8.7 mg/dL — ABNORMAL LOW (ref 8.9–10.3)
Chloride: 101 mmol/L (ref 98–111)
Creatinine, Ser: 1.69 mg/dL — ABNORMAL HIGH (ref 0.44–1.00)
GFR, Estimated: 34 mL/min — ABNORMAL LOW (ref >60)
Glucose, Bld: 192 mg/dL — ABNORMAL HIGH (ref 70–99)
Phosphorus: 3.3 mg/dL (ref 2.5–4.6)
Potassium: 3.9 mmol/L (ref 3.5–5.1)
Sodium: 136 mmol/L (ref 135–145)

## 2021-04-24 LAB — CBC
HCT: 29.6 % — ABNORMAL LOW (ref 36.0–46.0)
Hemoglobin: 9.4 g/dL — ABNORMAL LOW (ref 12.0–15.0)
MCH: 27.9 pg (ref 26.0–34.0)
MCHC: 31.8 g/dL (ref 30.0–36.0)
MCV: 87.8 fL (ref 80.0–100.0)
Platelets: 215 10*3/uL (ref 150–400)
RBC: 3.37 MIL/uL — ABNORMAL LOW (ref 3.87–5.11)
RDW: 16.7 % — ABNORMAL HIGH (ref 11.5–15.5)
WBC: 9.8 10*3/uL (ref 4.0–10.5)
nRBC: 0 % (ref 0.0–0.2)

## 2021-04-24 LAB — GLUCOSE, CAPILLARY
Glucose-Capillary: 201 mg/dL — ABNORMAL HIGH (ref 70–99)
Glucose-Capillary: 158 mg/dL — ABNORMAL HIGH (ref 70–99)
Glucose-Capillary: 88 mg/dL (ref 70–99)
Glucose-Capillary: 113 mg/dL — ABNORMAL HIGH (ref 70–99)

## 2021-04-24 SURGERY — COLONOSCOPY
Anesthesia: General

## 2021-04-24 MED ORDER — LOSARTAN POTASSIUM 25 MG PO TABS
25.0000 mg | ORAL_TABLET | Freq: Every day | ORAL | Status: DC
  Administered 2021-04-24 – 2021-04-25 (×2): 25 mg via ORAL
  Filled 2021-04-24 (×2): qty 1

## 2021-04-24 MED ORDER — METHOCARBAMOL 500 MG PO TABS: 500.0000 mg | ORAL_TABLET | Freq: Four times a day (QID) | ORAL | Status: DC | PRN

## 2021-04-24 MED ORDER — PROPOFOL 500 MG/50ML IV EMUL
INTRAVENOUS | Status: DC | PRN
  Administered 2021-04-24: 25 ug/kg/min via INTRAVENOUS

## 2021-04-24 MED ORDER — SODIUM CHLORIDE 0.9 % IV SOLN: INTRAVENOUS | Status: DC

## 2021-04-24 MED ORDER — TORSEMIDE 20 MG PO TABS
20.0000 mg | ORAL_TABLET | Freq: Every day | ORAL | Status: DC
  Administered 2021-04-24: 20 mg via ORAL
  Filled 2021-04-24: qty 1

## 2021-04-24 MED ORDER — LIDOCAINE HCL (CARDIAC) PF 100 MG/5ML IV SOSY
PREFILLED_SYRINGE | INTRAVENOUS | Status: DC | PRN
  Administered 2021-04-24 (×2): 50 mg via INTRAVENOUS

## 2021-04-24 NOTE — Progress Notes (Signed)
Progress Note  Patient Name: [name] Date of Encounter: 04/24/2021  [facility] Cardiologist: Minus Breeding, MD   Subjective   No acute events overnight, feels okay, denies chest pain, colonoscopy being planned today.  Inpatient Medications    Scheduled Meds:  carvedilol  3.125 mg Oral BID WC   empagliflozin  10 mg Oral Daily   ezetimibe  10 mg Oral Daily   insulin aspart  0-15 Units Subcutaneous TID WC   insulin glargine-yfgn  30 Units Subcutaneous QHS   levETIRAcetam  1,500 mg Oral QHS   losartan  25 mg Oral Daily   rosuvastatin  40 mg Oral Daily   sertraline  100 mg Oral BID   Continuous Infusions:  PRN Meds: acetaminophen, albuterol, ALPRAZolam, cycloSPORINE, lidocaine, methocarbamol, nitroGLYCERIN, ondansetron (ZOFRAN) IV, pregabalin   Vital Signs    Vitals:   04/23/21 1528 04/23/21 1926 04/24/21 0407 04/24/21 0820  BP: 125/61 127/66 (!) 161/75 (!) 171/82  Pulse: 79 79 94 (!) 103  Resp: 16 16 16 19   Temp: 98 F (36.7 C) 98 F (36.7 C) 98.6 F (37 C) 98.4 F (36.9 C)  TempSrc: Oral Oral  Oral  SpO2: 96% 97% 92% 96%  Weight:      Height:        Intake/Output Summary (Last 24 hours) at 04/24/2021 1226 Last data filed at 04/23/2021 1412 Gross per 24 hour  Intake 0 ml  Output --  Net 0 ml   Last 3 Weights 04/21/2021 02/21/2021 02/19/2021  Weight (lbs) 190 lb 181 lb 7 oz 181 lb 3.2 oz  Weight (kg) 86.183 kg 82.3 kg 82.192 kg      Telemetry    Currently not on telemetry- Personally Reviewed  ECG     - Personally Reviewed  Physical Exam   GEN: No acute distress.   Neck: No JVD Cardiac: RRR, 2/6 systolic murmur Respiratory: Diminished breath sounds at bases GI: Soft, nontender, non-distended  MS: Bilateral BKA noted Neuro:  Nonfocal  Psych: Normal affect   Labs    High Sensitivity Troponin:   Recent Labs  Lab 04/21/21 1737 04/21/21 1956  TROPONINIHS 876* 822*     Chemistry Recent Labs  Lab 04/21/21 1737 04/23/21 0404  04/24/21 0317  NA 134* 134* 136  K 4.7 4.5 3.9  CL 96* 98 101  CO2 25 25 26   GLUCOSE 316* 202* 192*  BUN 49* 44* 30*  CREATININE 2.58* 2.12* 1.69*  CALCIUM 8.5* 8.3* 8.7*  PROT 6.8  --   --   ALBUMIN 3.3*  --  3.2*  AST 48*  --   --   ALT 29  --   --   ALKPHOS 52  --   --   BILITOT 0.7  --   --   GFRNONAA 20* 26* 34*  ANIONGAP 13 11 9     Lipids No results for input(s): CHOL, TRIG, HDL, LABVLDL, LDLCALC, CHOLHDL in the last 168 hours.  Hematology Recent Labs  Lab 04/21/21 1737 04/22/21 1324 04/22/21 0516 04/23/21 0404 04/24/21 0317  WBC 11.8*  --   --  10.3 9.8  RBC 3.10*  --   --  3.34* 3.37*  HGB 8.4*   < > 7.9* 9.3* 9.4*  HCT 28.2*   < > 25.3* 29.2* 29.6*  MCV 91.0  --   --  87.4 87.8  MCH 27.1  --   --  27.8 27.9  MCHC 29.8*  --   --  31.8 31.8  RDW  17.0*  --   --  16.9* 16.7*  PLT 211  --   --  216 215   < > = values in this interval not displayed.   Thyroid No results for input(s): TSH, FREET4 in the last 168 hours.  BNP Recent Labs  Lab 04/21/21 1755  BNP 583.7*    DDimer  Recent Labs  Lab 04/21/21 1756  DDIMER 18.50*     Radiology    No results found.  Cardiac Studies   TTE 04/22/2021 1. Left ventricular ejection fraction, by estimation, is 35 to 40%. The  left ventricle has moderately decreased function. Left ventricular  endocardial border not optimally defined to evaluate regional wall motion.  There is mild left ventricular  hypertrophy. Left ventricular diastolic parameters are indeterminate.   2. Right ventricular systolic function is normal. The right ventricular  size is normal. There is mildly elevated pulmonary artery systolic  pressure.   3. Left atrial size was moderately dilated.   4. The mitral valve is normal in structure. No evidence of mitral valve  regurgitation. Mild to moderate mitral stenosis. The mean mitral valve  gradient is 5.0 mmHg. Severe mitral annular calcification.   5. The aortic valve is calcified. Aortic  valve regurgitation is not  visualized. Moderate to severe aortic valve stenosis. Aortic valve mean  gradient measures 24.0 mmHg. Aortic valve is not well seen. Possible low  flow-low gradient severe aortic  stenosis.   Patient Profile     66 y.o. female with history of CAD/CABG x4, bilateral BKA, diabetes, severe aortic stenosis, carotid endarterectomy, hypertension, CKD presenting with GI bleeding, syncope.  Being seen for severe aortic stenosis.  Assessment & Plan    Severe aortic stenosis -TAVR work-up, right and left heart cath as outpatient -Work-up for GI bleed prior to invasive cardiac testing  2.  Ischemic cardiomyopathy, EF 35 to 40% -Diminished breath sounds at bases -Start torsemide 20 mg daily.  Titrate as needed(takes 60 mg at home) -Start low-dose losartan 25 mg daily, continue Coreg 3.125 mg . -Low blood pressures preventing up titration of CHF meds at this point.  3.  CAD/CABG -Denies chest pain -Crestor, Zetia.  Holding aspirin due to anemia.  4.  GI bleed, anemia -S/p blood transfusion 2 days ago -H&H stable over the past 24 hours -Work-up/colonoscopy as per GI  Total encounter time more than 50 minutes  Greater than 50% was spent in counseling and coordination of care with the patient    Signed, Kate Sable, MD  04/24/2021, 12:26 PM

## 2021-04-24 NOTE — Progress Notes (Signed)
Mobility Specialist - Progress Note   04/24/21 1545  Mobility  Activity Off unit    Pt off unit for colonoscopy. Will attempt session as patient becomes available.  Kathee Delton Mobility Specialist 04/24/21, 3:46 PM

## 2021-04-24 NOTE — Care Management Important Message (Signed)
Important Message  Patient Details  Name: [name] MRN: [id number] Date of Birth: [birth year]   Medicare Important Message Given:  N/A - LOS <3 / Initial given by admissions     Dannette Barbara 04/24/2021, 8:53 AM

## 2021-04-24 NOTE — Anesthesia Postprocedure Evaluation (Signed)
Anesthesia Post Note  Patient: [name]  Procedure(s) Performed: COLONOSCOPY  Patient location during evaluation: PACU Anesthesia Type: General Level of consciousness: awake and alert, oriented and patient cooperative Pain management: pain level controlled Vital Signs Assessment: post-procedure vital signs reviewed and stable Respiratory status: spontaneous breathing, nonlabored ventilation and respiratory function stable Cardiovascular status: blood pressure returned to baseline and stable Postop Assessment: adequate PO intake Anesthetic complications: no   No notable events documented.   Last Vitals:  Vitals:   04/24/21 1806 04/24/21 1810  BP: (!) 161/69 (!) 158/71  Pulse: 92 92  Resp: 18 13  Temp:    SpO2: 100% 100%    Last Pain:  Vitals:   04/24/21 1810  TempSrc:   PainSc: 0-No pain                 Darrin Nipper

## 2021-04-24 NOTE — Progress Notes (Signed)
PT Cancellation Note  Patient Details Name: [name] MRN: [id number] DOB: December 25, 1955   Cancelled Treatment:    Reason Eval/Treat Not Completed: Patient declined to participate with PT services this date secondary to a combination of lethargy and frequent bowel movents.  Pt received recent bowel prep for colonoscopy scheduled for later this date.  Will attempt to see pt at a future date/time as medically appropriate.      Linus Salmons PT, DPT 04/24/21, 10:52 AM

## 2021-04-24 NOTE — Transfer of Care (Signed)
Immediate Anesthesia Transfer of Care Note  Patient: [name]  Procedure(s) Performed: COLONOSCOPY  Patient Location: PACU and Endoscopy Unit  Anesthesia Type:MAC  Level of Consciousness: awake  Airway & Oxygen Therapy: Patient Spontanous Breathing  Post-op Assessment: Report given to RN and Post -op Vital signs reviewed and stable  Post vital signs: Reviewed and stable  Last Vitals:  Vitals Value Taken Time  BP 120/64 04/24/21 1746  Temp 35.8 C 04/24/21 1746  Pulse 88 04/24/21 1748  Resp 15 04/24/21 1748  SpO2 98 % 04/24/21 1748  Vitals shown include unvalidated device data.  Last Pain:  Vitals:   04/24/21 1746  TempSrc: Temporal  PainSc: 0-No pain         Complications: No notable events documented.

## 2021-04-24 NOTE — Progress Notes (Signed)
OT Cancellation Note  Patient Details Name: DEVAN BABINO MRN: [id number] DOB: [birth year]   Cancelled Treatment:    Reason Eval/Treat Not Completed: Other (comment). OT order received and chart reviewed. Pt declined OT evaluation at this time. OT to re-attempt at next available time.   Darleen Crocker, MS, OTR/L , CBIS ascom [phone]  04/24/21, 10:57 AM

## 2021-04-24 NOTE — TOC Initial Note (Signed)
Transition of Care [facility]) - Initial/Assessment Note    Patient Details  Name: [name] MRN: [id number] Date of Birth: 04/25/1955  Transition of Care [facility]) CM/SW Contact:    Candie Chroman, LCSW Phone Number: 04/24/2021, 11:45 AM  Clinical Narrative:  Readmission prevention screen complete. CSW met with patient. Son at bedside. CSW introduced role and explained that discharge planning would be discussed. Patient sleepy this morning so son answered most of the questions. PCP is Lona Kettle, MD at [facility] in [place]. Son drives her to appointments. [facility] [place]. No issues obtaining medications. No home health prior to admission but son thinks she may need it. PT/OT evals pending. Will follow for recommendations. Patient has a wheelchair and walker at home. She also has a bedside commode that is about 64 years old so they are requesting a new one. Son will drive her home at discharge. No further concerns. CSW encouraged patient and her son to contact CSW as needed. CSW will continue to follow patient and her son for support and facilitate return home when stable.                Expected Discharge Plan: [place] Barriers to Discharge: Continued Medical Work up   Patient Goals and CMS Choice        Expected Discharge Plan and Services Expected Discharge Plan: [place] Acute Care Choice:  (TBD) Living arrangements for the past 2 months: Single Family Home                                      Prior Living Arrangements/Services Living arrangements for the past 2 months: Single Family Home Lives with:: Spouse, Adult Children Patient language and need for interpreter reviewed:: Yes Do you feel safe going back to the place where you live?: Yes      Need for Family Participation in Patient Care: Yes (Comment) Care giver support system in place?: Yes (comment) Current home services: DME Criminal  Activity/Legal Involvement Pertinent to Current Situation/Hospitalization: No - Comment as needed  Activities of Daily Living Home Assistive Devices/Equipment: Blood pressure cuff, CBG Meter, Eyeglasses, Prosthesis, Raised toilet seat with rails, Shower chair with back, Wheelchair, Environmental consultant (specify type) ADL Screening (condition at time of admission) Patient's cognitive ability adequate to safely complete daily activities?: Yes Is the patient deaf or have difficulty hearing?: No Does the patient have difficulty seeing, even when wearing glasses/contacts?: No Does the patient have difficulty concentrating, remembering, or making decisions?: No Patient able to express need for assistance with ADLs?: Yes Does the patient have difficulty dressing or bathing?: No Independently performs ADLs?: Yes (appropriate for developmental age) Does the patient have difficulty walking or climbing stairs?: No (with prosthesis) Weakness of Legs: None Weakness of Arms/Hands: None  Permission Sought/Granted Permission sought to share information with : Family Supports    Share Information with NAME: Chance Schriner     Permission granted to share info w Relationship: Son  Permission granted to share info w Contact Information: [phone]  Emotional Assessment Appearance:: Appears stated age Attitude/Demeanor/Rapport: Engaged, Lethargic, Gracious Affect (typically observed): Accepting, Appropriate, Calm, Pleasant Orientation: : Oriented to Self, Oriented to Place, Oriented to  Time, Oriented to Situation Alcohol / Substance Use: Not Applicable Psych Involvement: No (comment)  Admission diagnosis:  Syncope and collapse [R55] Syncope, unspecified  syncope type [R55] Patient Active Problem List   Diagnosis Date Noted   Pressure injury of skin 04/22/2021   Syncope    Syncope and collapse 04/21/2021   Fall at home, initial encounter 04/21/2021   Closed fracture of pubic ramus, right, initial encounter  ([place]) 04/21/2021   NSTEMI (non-ST elevated myocardial infarction) ([place]) 04/21/2021   Acute kidney injury superimposed on chronic kidney disease IV ([place]) 04/21/2021   Hematochezia 19/91/4445   Acute metabolic encephalopathy 84/83/5075   Mixed diabetic hyperlipidemia associated with type 2 diabetes mellitus ([place]) 02/20/2021   Acute respiratory failure with hypoxia ([place]) 02/20/2021   Atrial tachycardia ([place]) 02/20/2021   COVID-19 virus infection 02/19/2021   MR (congenital mitral regurgitation) 01/17/2021   Aortic valve stenosis 01/17/2021   Chronic kidney disease, stage 3b ([place]) 01/17/2021   Acute on chronic systolic CHF (congestive heart failure) ([place]) 01/17/2021   Generalized anxiety disorder with panic attacks    Acute congestive heart failure 03/29/2018   DOE (dyspnea on exertion) 03/29/2018   S/P CABG x 4 03/29/2018   S/P below-knee amputation of both lower extremities 03/29/2018   Acute cystitis 10/07/2016   Pain medication agreement signed 10/06/2016   Gastroesophageal reflux disease 07/09/2016   CKD stage 3 due to type 2 diabetes mellitus 12/26/2015   Rash 12/26/2015   Bruit 10/29/2015   Murmur, cardiac 10/29/2015   Chronic pain syndrome 09/25/2015   Coronary artery disease involving native coronary artery of native heart without angina pectoris 09/25/2015   High risk medication use 09/25/2015   Neuropathy 09/25/2015   Localization-related symptomatic epilepsy and epileptic syndromes with complex partial seizures, not intractable, without status epilepticus ([place]) 06/27/2014   Phantom limb pain ([place]) 06/27/2014   Seizure disorder ([place]) 09/02/2013   Atherosclerosis of native arteries of the extremities with ulceration 07/14/2013   Protein-calorie malnutrition, severe ([place]) 06/20/2013   Osteomyelitis of ankle or foot, left, acute ([place]) 06/17/2013   Charcot foot due to diabetes mellitus ([place]) 05/13/2013   PVD (peripheral vascular disease) ([place]) 03/25/2012   Atherosclerosis of  native artery of extremity with intermittent claudication 09/16/2011   Uncontrolled type 2 diabetes mellitus with hyperglycemia, with long-term current use of insulin ([place]) 03/14/2008   Hyperlipidemia, unspecified 03/14/2008   Acute on chronic anemia , possible acute blood loss anemia 03/14/2008   Major depressive disorder 03/14/2008   Essential hypertension 03/14/2008   CAD (coronary artery disease) of artery bypass graft 03/14/2008   Carotid stenosis 03/14/2008   Right-sided carotid artery disease ([place]) 03/14/2008   Bruit 03/14/2008   History of carotid endarterectomy, right 03/14/2008   History of cholecystectomy, laparoscopic 03/14/2008   Hypotension 03/14/2008   Chronic diastolic CHF (congestive heart failure) ([place]) 03/14/2008   PCP:  [name], MD [id number] [place], [place] [address]Phone: [phone]: [phone]    Social Determinants of Health (SDOH) Interventions    Readmission Risk Interventions Readmission Risk Prevention Plan 04/24/2021  Transportation Screening Complete  PCP or Specialist Appt within 3-5 Days Complete  Social Work Consult for RLymanPlanning/Counseling Complete  Palliative Care Screening Not Applicable  Medication Review (Press photographer Complete  Some recent data might be hidden

## 2021-04-24 NOTE — Op Note (Signed)
[facility] Gastroenterology Patient Name: [name] Procedure Date: 04/24/2021 4:48 PM MRN: [id number] Account #: [id number] Date of Birth: [birth year] Admit Type: Outpatient Age: 66 Room: [facility] ENDO ROOM 2 Gender: Female Note Status: Finalized Instrument Name: Jasper Riling [id number] Procedure:             Colonoscopy Indications:           Hematochezia, Acute post hemorrhagic anemia Providers:             Benay Pike. Addiel Mccardle MD, MD Medicines:             Propofol per Anesthesia Complications:         No immediate complications. Procedure:             Pre-Anesthesia Assessment:                        - The risks and benefits of the procedure and the                         sedation options and risks were discussed with the                         patient. All questions were answered and informed                         consent was obtained.                        - Patient identification and proposed procedure were                         verified prior to the procedure by the nurse. The                         procedure was verified in the procedure room.                        - ASA Grade Assessment: III - A patient with severe                         systemic disease.                        - After reviewing the risks and benefits, the patient                         was deemed in satisfactory condition to undergo the                         procedure.                        After obtaining informed consent, the colonoscope was                         passed under direct vision. Throughout the procedure,                         the patient's blood pressure, pulse, and oxygen  saturations were monitored continuously. The                         Colonoscope was introduced through the anus and                         advanced to the the cecum, identified by appendiceal                         orifice and ileocecal valve. The colonoscopy was                          performed without difficulty. The patient tolerated                         the procedure well. The quality of the bowel                         preparation was good. The ileocecal valve, appendiceal                         orifice, and rectum were photographed. Findings:      The perianal and digital rectal examinations were normal. Pertinent       negatives include normal sphincter tone and no palpable rectal lesions.      Non-bleeding internal hemorrhoids were found during retroflexion. The       hemorrhoids were Grade I (internal hemorrhoids that do not prolapse).      A few small-mouthed diverticula were found in the sigmoid colon.      A patchy area of mildly erythematous mucosa was found in the sigmoid       colon. Biopsies were taken with a cold forceps for histology.      A 6 mm polyp was found in the ascending colon. The polyp was sessile.       The polyp was removed with a cold snare. Resection and retrieval were       complete.      Two sessile polyps were found in the ascending colon. The polyps were 3       to 5 mm in size. These polyps were removed with a jumbo cold forceps.       Resection and retrieval were complete.      A 7 mm polyp was found in the transverse colon. The polyp was sessile.       The polyp was removed with a hot snare. Resection and retrieval were       complete.      A 4 mm polyp was found in the sigmoid colon. The polyp was sessile. The       polyp was removed with a jumbo cold forceps. Resection and retrieval       were complete.      The exam was otherwise without abnormality. Impression:            - Non-bleeding internal hemorrhoids.                        - Diverticulosis in the sigmoid colon.                        - Erythematous mucosa in the sigmoid colon. Biopsied.                        -  One 6 mm polyp in the ascending colon, removed with                         a cold snare. Resected and retrieved.                         - Two 3 to 5 mm polyps in the ascending colon, removed                         with a jumbo cold forceps. Resected and retrieved.                        - One 7 mm polyp in the transverse colon, removed with                         a hot snare. Resected and retrieved.                        - One 4 mm polyp in the sigmoid colon, removed with a                         jumbo cold forceps. Resected and retrieved.                        - The examination was otherwise normal. Recommendation:        - Patient has a contact number available for                         emergencies. The signs and symptoms of potential                         delayed complications were discussed with the patient.                         Return to normal activities tomorrow. Written                         discharge instructions were provided to the patient.                        - Return patient to hospital ward for ongoing care.                        - Await pathology results.                        - Advance diet as tolerated.                        - Continue present medications.                        - Await pathology results. Procedure Code(s):     --- Professional ---                        [phone], Colonoscopy, flexible; with removal of  tumor(s), polyp(s), or other lesion(s) by snare                         technique                        45380, 27, Colonoscopy, flexible; with biopsy, single                         or multiple Diagnosis Code(s):     --- Professional ---                        K57.30, Diverticulosis of large intestine without                         perforation or abscess without bleeding                        D62, Acute posthemorrhagic anemia                        K92.1, Melena (includes Hematochezia)                        K63.5, Polyp of colon                        K63.89, Other specified diseases of intestine                        K64.0, First degree  hemorrhoids CPT copyright 2019 American Medical Association. All rights reserved. The codes documented in this report are preliminary and upon coder review may  be revised to meet current compliance requirements. Efrain Sella MD, MD 04/24/2021 5:45:51 PM This report has been signed electronically. Number of Addenda: 0 Note Initiated On: 04/24/2021 4:48 PM Scope Withdrawal Time: 0 hours 9 minutes 12 seconds  Total Procedure Duration: 0 hours 12 minutes 44 seconds  Estimated Blood Loss:  Estimated blood loss: none.      [facility]

## 2021-04-24 NOTE — Progress Notes (Signed)
°   04/24/21 2205  Clinical Encounter Type  Visited With Family  Visit Type Initial;Spiritual support;Social support  Referral From Other (Comment)   Chaplain Burris met Pt's spouse and son in hallway. Chaplain B inquired about their well-being and offered her pastoral support/informed them that she is available throughout the night (spouse indicated he planned to stay, Chaplain B asked about and encouraged his self-care). Will f/u as needed.

## 2021-04-24 NOTE — Anesthesia Preprocedure Evaluation (Addendum)
Anesthesia Evaluation  Patient identified by MRN, date of birth, ID band Patient awake    Reviewed: Allergy & Precautions, NPO status , Patient's Chart, lab work & pertinent test results  History of Anesthesia Complications Negative for: history of anesthetic complications  Airway Mallampati: IV   Neck ROM: Full    Dental   Missing several molars, front upper teeth chipped:   Pulmonary neg pulmonary ROS,    Pulmonary exam normal breath sounds clear to auscultation       Cardiovascular hypertension, + CAD (s/p CABG), + Peripheral Vascular Disease (s/p b/l BKA and CEA on Plavix) and +CHF (EF 35-40%)  Normal cardiovascular exam+ Valvular Problems/Murmurs (severe AS)  Rhythm:Regular Rate:Normal  ECG 04/21/21:  Sinus rhythm Left bundle branch block  TTE 04/22/2021 1. Left ventricular ejection fraction, by estimation, is 35 to 40%. The left ventricle has moderately decreased function. Left ventricular endocardial border not optimally defined to evaluate regional wall motion. There is mild left ventricular hypertrophy. Left ventricular diastolic parameters are indeterminate.  2. Right ventricular systolic function is normal. The right ventricular size is normal. There is mildly elevated pulmonary artery systolic pressure.  3. Left atrial size was moderately dilated.  4. The mitral valve is normal in structure. No evidence of mitral valve regurgitation. Mild to moderate mitral stenosis. The mean mitral valve gradient is 5.0 mmHg. Severe mitral annular calcification.  5. The aortic valve is calcified. Aortic valve regurgitation is not visualized. Moderate to severe aortic valve stenosis. Aortic valve mean gradient measures 24.0 mmHg. Aortic valve is not well seen. Possible low flow-low gradient severe aortic stenosis.    Neuro/Psych Seizures -,  PSYCHIATRIC DISORDERS Anxiety Depression Chronic pain  Neuromuscular disease (diabetic  neuropathy)    GI/Hepatic GERD  ,GI bleed   Endo/Other  diabetes, Type 2, Insulin DependentObesity   Renal/GU Renal disease (stage III CKD)     Musculoskeletal  (+) Arthritis ,   Abdominal   Peds  Hematology  (+) Blood dyscrasia, anemia ,   Anesthesia Other Findings Inpatient cardiology note 04/24/21:  1. Severe aortic stenosis -TAVR work-up, right and left heart cath as outpatient -Work-up for GI bleed prior to invasive cardiac testing  2.  Ischemic cardiomyopathy, EF 35 to 40% -Diminished breath sounds at bases -Start torsemide 20 mg daily.  Titrate as needed(takes 60 mg at home) -Start low-dose losartan 25 mg daily, continue Coreg 3.125 mg . -Low blood pressures preventing up titration of CHF meds at this point.  3.  CAD/CABG -Denies chest pain -Crestor, Zetia.  Holding aspirin due to anemia.  4.  GI bleed, anemia -S/p blood transfusion 2 days ago -H&H stable over the past 24 hours -Work-up/colonoscopy as per GI   Outpatient cardiology note 01/31/21:  ACUTE ON CHRONIC SYSTOLIC HF: She eventually needs cardiac catheterization.  However, she first needs to have her renal function optimized.  I recheck the basic metabolic profile today.  However, she also needs to see nephrology.  She seems to be euvolemic.  Today I am going to increase her carvedilol although she is not really clear what dose she is taking.  She is going to call us back as I think she is taking 6.25 twice daily and I would like to double dose.   AS: We went through this physiology again today.  She eventually will need right and left heart catheterization and assessment for possible TAVR.    HTN:   This is being managed in the context of treating his CHF  CAD/CABG:  The patient has no new sypmtoms.  No further cardiovascular testing is indicated.  We will continue with aggressive risk reduction and meds as listed.  MITRAL REGURGITATION:   Final echo is pending.   AKI:  I have placed a  request for nephrology appointment.  She apparently saw them about a year ago.  Reproductive/Obstetrics                            Anesthesia Physical Anesthesia Plan  ASA: 4  Anesthesia Plan: General   Post-op Pain Management:    Induction: Intravenous  PONV Risk Score and Plan: 3 and Propofol infusion, TIVA and Treatment may vary due to age or medical condition  Airway Management Planned: Natural Airway  Additional Equipment:   Intra-op Plan:   Post-operative Plan:   Informed Consent: I have reviewed the patients History and Physical, chart, labs and discussed the procedure including the risks, benefits and alternatives for the proposed anesthesia with the patient or authorized representative who has indicated his/her understanding and acceptance.       Plan Discussed with: CRNA  Anesthesia Plan Comments: (Patient is at high risk of cardiac complications due to her severe AS; discussed with patient and she wishes to proceed.  LMA/GETA backup discussed.  Patient consented for risks of anesthesia including but not limited to:  - adverse reactions to medications - damage to eyes, teeth, lips or other oral mucosa - nerve damage due to positioning  - sore throat or hoarseness - damage to heart, brain, nerves, lungs, other parts of body or loss of life  Informed patient about role of CRNA in peri- and intra-operative care.  Patient voiced understanding.)        Anesthesia Quick Evaluation

## 2021-04-24 NOTE — Progress Notes (Signed)
PROGRESS NOTE    [name]  PNT:[id number] DOB: October 08, 1955 DOA: 04/21/2021 PCP: [name], MD    Brief Narrative:  66 y.o. female with a PMH significant for PVD s/p bilateral BKA, CAD s/p CABG x4, DM with neuropathy, complex partial seizures on Keppra, HTN, chronic pain syndrome, severe aortic stenosis on echocardiogram in November 2022 with EF 60-65% (improved from prior of 35%). They presented from home to the ED on 04/21/2021 with syncopal episode. She endorses having several episodes of hematochezia/bloody tissue after Bms in the past few days.  In the ED, it was found that they had hypotension, hypoxia requiring 2L O2, hgb 8.4, elevated troponin to 876. They were treated with ACS rule out, EEG, echo, PE study.  Significant results were that her hgb decreased rapidly to 7.5 requiring transfusion and demonstrated severe AS on echo.  GI and cardiology were consulted.  Patient was admitted to medicine service for further workup and management of syncope as outlined in detail below.   Assessment & Plan:   Principal Problem:   Syncope and collapse Active Problems:   Uncontrolled type 2 diabetes mellitus with hyperglycemia, with long-term current use of insulin (HCC)   Acute on chronic anemia , possible acute blood loss anemia   Essential hypertension   History of carotid endarterectomy, right   PVD (peripheral vascular disease) (HCC)   Localization-related symptomatic epilepsy and epileptic syndromes with complex partial seizures, not intractable, without status epilepticus (HCC)   Chronic pain syndrome   High risk medication use   S/P CABG x 4   Aortic valve stenosis   Acute metabolic encephalopathy   Acute respiratory failure with hypoxia ([place])   Fall at home, initial encounter   Closed fracture of pubic ramus, right, initial encounter ([place])   NSTEMI (non-ST elevated myocardial infarction) ([place])   Acute kidney injury superimposed on chronic kidney disease IV (HCC)    Hypotension   Chronic diastolic CHF (congestive heart failure) (HCC)   Hematochezia   Pressure injury of skin   Syncope  Syncope-  multifactorial as described below.  Likely related to GI bleed versus cardiac valvulopathy versus internal carotid disease.  Unable to exclude arrhythmia genic versus vasovagal syncope Plan: Therapy evaluations Orthostatics   Symptomatic anemia Suspect underlying GI bleed Three-point drop in hemoglobin since admission Status post 1 unit PRBC 2/6 Hemoglobin stable Plan: Colonoscopy today  Hypotension- resolved. Home antihypertensives have been restarted Transient hypotension likely driving syncopal event    NSTEMI (non-ST elevated myocardial infarction) ([place]), ruled out CAD S/P CABG x 4 Severe aortic stenosis Troponin elevation without significant delta  Secondary to supply/demand ischemia  Heparin GGT stopped 2/7 Plan: Monitor hemoglobin Add low-dose aspirin at time of discharge if hemoglobin stable Outpatient follow-up for heart catheterization versus gated CTA for TAVR consideration    Acute kidney injury superimposed on CKD IV improved, creatinine baseline Monitor renal function and avoid nephrotoxins   Acute respiratory failure with hypoxia ([place])  resolved. ORA   Closed fracture of pubic ramus, right, initial encounter (HCC) Pain control PRN PT eval   Fall at home, initial encounter Suspect secondary to acute medical condition Treat acute medical condition PT eval   Uncontrolled type 2 diabetes mellitus with hyperglycemia, with long-term current use of insulin ([place]) patient previously refusing CBG monitoring and insulin but is now agreeable to long-acting Sliding scale insulin coverage if able Basal insulin   Acute metabolic encephalopathy(present on admission)  resolved Suspect secondary to hypotension, with decreased perfusion CT head  nonacute     Localization-related symptomatic epilepsy and epileptic syndromes with  complex partial seizures, not intractable, without status epilepticus ([place])- (present on admission) Continue Keppra   PVD (peripheral vascular disease) ([place])- (present on admission) Continue aspirin and statins.  Patient is s/p BKA   History of carotid endarterectomy, right Continue aspirin and statins   Chronic diastolic CHF (congestive heart failure) (HCC) Currently euvolemic to dry Chest x-ray clear but BNP elevated to the 500s Torsemide, Coreg, losartan restarted   Chronic pain syndrome- (present on admission) Continue Lyrica   Essential hypertension- (present on admission) Antihypertensive has been restarted   Depression/anxiety-  - continue home medications   DVT prophylaxis: SCD Code Status: Full Family Communication: Son at bedside 2/8 Disposition Plan: Status is: Inpatient Remains inpatient appropriate because: Anemia, suspicion for GI bleed.  Plan for diagnostic colonoscopy today           Level of care: Med-Surg  Consultants:  GI Cardiology  Procedures:  Colonoscopy plan 2/8  Antimicrobials: None   Subjective: Seen and examined.  Resting in bed.  No visible distress.  Does endorse some pain in lower extremities when moving secondary to nondisplaced pelvic fracture  Objective: Vitals:   04/23/21 1528 04/23/21 1926 04/24/21 0407 04/24/21 0820  BP: 125/61 127/66 (!) 161/75 (!) 171/82  Pulse: 79 79 94 (!) 103  Resp: 16 16 16 19   Temp: 98 F (36.7 C) 98 F (36.7 C) 98.6 F (37 C) 98.4 F (36.9 C)  TempSrc: Oral Oral  Oral  SpO2: 96% 97% 92% 96%  Weight:      Height:        Intake/Output Summary (Last 24 hours) at 04/24/2021 1228 Last data filed at 04/23/2021 1412 Gross per 24 hour  Intake 0 ml  Output --  Net 0 ml   Filed Weights   04/21/21 1724  Weight: 86.2 kg    Examination:  General exam: Appears calm and comfortable  Respiratory system: Clear to auscultation. Respiratory effort normal. Cardiovascular system: S1-S2, regular  rate and rhythm, 2/6 systolic murmur, no lower extremity edema Gastrointestinal system: Abdomen is nondistended, soft and nontender. No organomegaly or masses felt. Normal bowel sounds heard. Central nervous system: Alert and oriented. No focal neurological deficits. Extremities: Symmetric 5 x 5 power. Skin: No rashes, lesions or ulcers Psychiatry: Judgement and insight appear normal. Mood & affect appropriate.     Data Reviewed: I have personally reviewed following labs and imaging studies  CBC: Recent Labs  Lab 04/21/21 1737 04/22/21 0312 04/22/21 0516 04/23/21 0404 04/24/21 0317  WBC 11.8*  --   --  10.3 9.8  NEUTROABS 9.9*  --   --   --   --   HGB 8.4* 7.5* 7.9* 9.3* 9.4*  HCT 28.2* 23.8* 25.3* 29.2* 29.6*  MCV 91.0  --   --  87.4 87.8  PLT 211  --   --  216 466   Basic Metabolic Panel: Recent Labs  Lab 04/21/21 1737 04/23/21 0404 04/24/21 0317  NA 134* 134* 136  K 4.7 4.5 3.9  CL 96* 98 101  CO2 25 25 26   GLUCOSE 316* 202* 192*  BUN 49* 44* 30*  CREATININE 2.58* 2.12* 1.69*  CALCIUM 8.5* 8.3* 8.7*  PHOS  --   --  3.3   GFR: Estimated Creatinine Clearance: 37 mL/min (A) (by C-G formula based on SCr of 1.69 mg/dL (H)). Liver Function Tests: Recent Labs  Lab 04/21/21 1737 04/24/21 0317  AST 48*  --  ALT 29  --   ALKPHOS 52  --   BILITOT 0.7  --   PROT 6.8  --   ALBUMIN 3.3* 3.2*   No results for input(s): LIPASE, AMYLASE in the last 168 hours. No results for input(s): AMMONIA in the last 168 hours. Coagulation Profile: Recent Labs  Lab 04/21/21 1740  INR 1.2   Cardiac Enzymes: No results for input(s): CKTOTAL, CKMB, CKMBINDEX, TROPONINI in the last 168 hours. BNP (last 3 results) No results for input(s): PROBNP in the last 8760 hours. HbA1C: No results for input(s): HGBA1C in the last 72 hours. CBG: Recent Labs  Lab 04/23/21 1539 04/23/21 1708 04/23/21 2133 04/24/21 0835 04/24/21 1137  GLUCAP 153* 144* 192* 201* 158*   Lipid  Profile: No results for input(s): CHOL, HDL, LDLCALC, TRIG, CHOLHDL, LDLDIRECT in the last 72 hours. Thyroid Function Tests: No results for input(s): TSH, T4TOTAL, FREET4, T3FREE, THYROIDAB in the last 72 hours. Anemia Panel: No results for input(s): VITAMINB12, FOLATE, FERRITIN, TIBC, IRON, RETICCTPCT in the last 72 hours. Sepsis Labs: Recent Labs  Lab 04/21/21 1756 04/21/21 2355 04/22/21 0312 04/23/21 0404  PROCALCITON 0.27  --  0.69 0.82  LATICACIDVEN  --  0.8 0.6  --     Recent Results (from the past 240 hour(s))  Resp Panel by RT-PCR (Flu A&B, Covid) Nasopharyngeal Swab     Status: None   Collection Time: 04/21/21  5:40 PM   Specimen: Nasopharyngeal Swab; Nasopharyngeal(NP) swabs in vial transport medium  Result Value Ref Range Status   SARS Coronavirus 2 by RT PCR NEGATIVE NEGATIVE Final    Comment: (NOTE) SARS-CoV-2 target nucleic acids are NOT DETECTED.  The SARS-CoV-2 RNA is generally detectable in upper respiratory specimens during the acute phase of infection. The lowest concentration of SARS-CoV-2 viral copies this assay can detect is 138 copies/mL. A negative result does not preclude SARS-Cov-2 infection and should not be used as the sole basis for treatment or other patient management decisions. A negative result may occur with  improper specimen collection/handling, submission of specimen other than nasopharyngeal swab, presence of viral mutation(s) within the areas targeted by this assay, and inadequate number of viral copies(<138 copies/mL). A negative result must be combined with clinical observations, patient history, and epidemiological information. The expected result is Negative.  Fact Sheet for Patients:  EntrepreneurPulse.com.au  Fact Sheet for Healthcare Providers:  IncredibleEmployment.be  This test is no t yet approved or cleared by the [place] FDA and  has been authorized for detection and/or diagnosis  of SARS-CoV-2 by FDA under an Emergency Use Authorization (EUA). This EUA will remain  in effect (meaning this test can be used) for the duration of the COVID-19 declaration under Section 564(b)(1) of the Act, 21 U.S.C.section 360bbb-3(b)(1), unless the authorization is terminated  or revoked sooner.       Influenza A by PCR NEGATIVE NEGATIVE Final   Influenza B by PCR NEGATIVE NEGATIVE Final    Comment: (NOTE) The Xpert Xpress SARS-CoV-2/FLU/RSV plus assay is intended as an aid in the diagnosis of influenza from Nasopharyngeal swab specimens and should not be used as a sole basis for treatment. Nasal washings and aspirates are unacceptable for Xpert Xpress SARS-CoV-2/FLU/RSV testing.  Fact Sheet for Patients: EntrepreneurPulse.com.au  Fact Sheet for Healthcare Providers: IncredibleEmployment.be  This test is not yet approved or cleared by the [place] FDA and has been authorized for detection and/or diagnosis of SARS-CoV-2 by FDA under an Emergency Use Authorization (EUA). This EUA will remain  in effect (meaning this test can be used) for the duration of the COVID-19 declaration under Section 564(b)(1) of the Act, 21 U.S.C. section 360bbb-3(b)(1), unless the authorization is terminated or revoked.  Performed at [facility], [address] [place], [place] [address]   MRSA Next Gen by PCR, Nasal     Status: None   Collection Time: 04/21/21 10:50 PM   Specimen: Nasal Mucosa; Nasal Swab  Result Value Ref Range Status   MRSA by PCR Next Gen NOT DETECTED NOT DETECTED Final    Comment: (NOTE) The GeneXpert MRSA Assay (FDA approved for NASAL specimens only), is one component of a comprehensive MRSA colonization surveillance program. It is not intended to diagnose MRSA infection nor to guide or monitor treatment for MRSA infections. Test performance is not FDA approved in patients less than 49 years old. Performed at [facility], [address] [place], [place] [address]          Radiology Studies: No results found.      Scheduled Meds:  carvedilol  3.125 mg Oral BID WC   empagliflozin  10 mg Oral Daily   ezetimibe  10 mg Oral Daily   insulin aspart  0-15 Units Subcutaneous TID WC   insulin glargine-yfgn  30 Units Subcutaneous QHS   levETIRAcetam  1,500 mg Oral QHS   losartan  25 mg Oral Daily   rosuvastatin  40 mg Oral Daily   sertraline  100 mg Oral BID   Continuous Infusions:   LOS: 3 days    Time spent: 50 minutes    Sidney Ace, MD [facility]   If 7PM-7AM, please contact night-coverage  04/24/2021, 12:28 PM

## 2021-04-25 ENCOUNTER — Encounter: Payer: Self-pay | Admitting: Internal Medicine

## 2021-04-25 DIAGNOSIS — I248 Other forms of acute ischemic heart disease: Secondary | ICD-10-CM

## 2021-04-25 DIAGNOSIS — D649 Anemia, unspecified: Secondary | ICD-10-CM

## 2021-04-25 DIAGNOSIS — I420 Dilated cardiomyopathy: Secondary | ICD-10-CM

## 2021-04-25 LAB — GLUCOSE, CAPILLARY
Glucose-Capillary: 74 mg/dL (ref 70–99)
Glucose-Capillary: 260 mg/dL — ABNORMAL HIGH (ref 70–99)

## 2021-04-25 MED ORDER — METHOCARBAMOL 500 MG PO TABS: 500.0000 mg | ORAL_TABLET | Freq: Four times a day (QID) | ORAL | 0 refills | Status: AC | PRN

## 2021-04-25 MED ORDER — TORSEMIDE 20 MG PO TABS
40.0000 mg | ORAL_TABLET | Freq: Every day | ORAL | Status: DC
  Administered 2021-04-25: 40 mg via ORAL
  Filled 2021-04-25: qty 2

## 2021-04-25 NOTE — Progress Notes (Addendum)
Progress Note  Patient Name: [name] Date of Encounter: 04/25/2021  Primary Cardiologist: Axel Filler  Subjective   Sitting in bed, no complaints Family at the bedside, discussed recent events Colonoscopy yesterday for hematochezia, acute posthemorrhagic anemia Nonbleeding internal hemorrhoids, diverticulosis, erythema mucosa sigmoid colon, numerous polyps Pathology pending  Discussed recent syncopal events April 21, 2021 Seem to coincide with hematochezia, with associated hypotension, anemia  Demand ischemia troponin 800 Echocardiogram with severe aortic valve stenosis, felt to have low flow high gradient  Inpatient Medications    Scheduled Meds:  carvedilol  3.125 mg Oral BID WC   empagliflozin  10 mg Oral Daily   ezetimibe  10 mg Oral Daily   insulin aspart  0-15 Units Subcutaneous TID WC   insulin glargine-yfgn  30 Units Subcutaneous QHS   levETIRAcetam  1,500 mg Oral QHS   losartan  25 mg Oral Daily   rosuvastatin  40 mg Oral Daily   sertraline  100 mg Oral BID   torsemide  40 mg Oral Daily   Continuous Infusions:  PRN Meds: acetaminophen, albuterol, ALPRAZolam, cycloSPORINE, lidocaine, methocarbamol, nitroGLYCERIN, ondansetron (ZOFRAN) IV, pregabalin   Vital Signs    Vitals:   04/24/21 1810 04/24/21 2006 04/25/21 0310 04/25/21 0743  BP: (!) 158/71 (!) 161/77 (!) 140/92 (!) 153/69  Pulse: 92 100 97 97  Resp: 13 18 20 18   Temp:  98.7 F (37.1 C) 98 F (36.7 C) 98.4 F (36.9 C)  TempSrc:  Oral Oral Oral  SpO2: 100% 97% 97% 97%  Weight:      Height:        Intake/Output Summary (Last 24 hours) at 04/25/2021 0752 Last data filed at 04/25/2021 4481 Gross per 24 hour  Intake 120 ml  Output 700 ml  Net -580 ml   Filed Weights   04/21/21 1724 04/24/21 1534  Weight: 86.2 kg 86.2 kg    Telemetry    Normal sinus rhythm- Personally Reviewed  ECG    No new tracings - Personally Reviewed  Physical Exam   GEN: No acute distress.    Neck: No JVD. Cardiac: RRR, 2-3/6 SEM RSB,  no  rubs, or gallops.  Respiratory: Clear to auscultation bilaterally.  GI: Soft, nontender, non-distended.   MS: No edema; No deformity. Neuro:  Alert and oriented x 3; Nonfocal.  Psych: Normal affect.  Labs    Chemistry Recent Labs  Lab 04/21/21 1737 04/23/21 0404 04/24/21 0317  NA 134* 134* 136  K 4.7 4.5 3.9  CL 96* 98 101  CO2 25 25 26   GLUCOSE 316* 202* 192*  BUN 49* 44* 30*  CREATININE 2.58* 2.12* 1.69*  CALCIUM 8.5* 8.3* 8.7*  PROT 6.8  --   --   ALBUMIN 3.3*  --  3.2*  AST 48*  --   --   ALT 29  --   --   ALKPHOS 52  --   --   BILITOT 0.7  --   --   GFRNONAA 20* 26* 34*  ANIONGAP 13 11 9      Hematology Recent Labs  Lab 04/21/21 1737 04/22/21 0312 04/22/21 0516 04/23/21 0404 04/24/21 0317  WBC 11.8*  --   --  10.3 9.8  RBC 3.10*  --   --  3.34* 3.37*  HGB 8.4*   < > 7.9* 9.3* 9.4*  HCT 28.2*   < > 25.3* 29.2* 29.6*  MCV 91.0  --   --  87.4 87.8  MCH 27.1  --   --  27.8 27.9  MCHC 29.8*  --   --  31.8 31.8  RDW 17.0*  --   --  16.9* 16.7*  PLT 211  --   --  216 215   < > = values in this interval not displayed.    Cardiac EnzymesNo results for input(s): TROPONINI in the last 168 hours. No results for input(s): TROPIPOC in the last 168 hours.   BNP Recent Labs  Lab 04/21/21 1755  BNP 583.7*     DDimer  Recent Labs  Lab 04/21/21 1756  DDIMER 18.50*     Radiology    No results found.  Cardiac Studies   2D echo 04/22/2021: 1. Left ventricular ejection fraction, by estimation, is 35 to 40%. The  left ventricle has moderately decreased function. Left ventricular  endocardial border not optimally defined to evaluate regional wall motion.  There is mild left ventricular  hypertrophy. Left ventricular diastolic parameters are indeterminate.   2. Right ventricular systolic function is normal. The right ventricular  size is normal. There is mildly elevated pulmonary artery systolic  pressure.    3. Left atrial size was moderately dilated.   4. The mitral valve is normal in structure. No evidence of mitral valve  regurgitation. Mild to moderate mitral stenosis. The mean mitral valve  gradient is 5.0 mmHg. Severe mitral annular calcification.   5. The aortic valve is calcified. Aortic valve regurgitation is not  visualized. Moderate to severe aortic valve stenosis. Aortic valve mean  gradient measures 24.0 mmHg. Aortic valve is not well seen. Possible low  flow-low gradient severe aortic  stenosis.  Patient Profile     66 y.o. female with history of CAD s/p 4-vessel CABG in 2008, HFrEF, aortic stenosis, DM, bilateral BKA's, carotid artery disease s/p right CEA in 2007, seizure disorder, CKD stage IV, HTN, HLD, and anemia of chronic disease who was admitted with syncope and we are seeing for  aortic valve stenosis, cardiomyopathy  Assessment & Plan    1. Syncope: -In the setting of hematochezia, hypotension, anemia and aortic valve stenosis Possible vagal event In the setting of fall with pubic ramus fracture As outpatient taking losartan 50 daily, torsemide 60 Coreg 6.25 twice daily -Inpatient medications Coreg 3.125 twice daily losartan 25 daily Blood pressures running higher, would likely be able to place back on original outpatient doses  2. CAD s/p CABG with elevated high sensitivity troponin: -Denies anginal symptoms Elevated troponin likely in the setting of demand ischemia Would consider outpatient ischemic work-up Ejection fraction estimated 35 to 40%, down from prior echo November 2022 estimated 60 to 65%, but comparable to echocardiogram September 2022 at [facility] ejection fraction 30 to 35%  3. Severe aortic stenosis: Consider outpatient TEE to confirm stenosis Mean gradient 25 mmHg, more moderate Severe by estimated valve area  4. HFrEF secondary to ICM: -Reduced ejection fraction as above though comparable to September 2022 at [facility] Consider TEE first, if no  severe aortic valve stenosis, would consider Myoview rather than catheterization in the setting of renal failure  5. GI bleed with symptomatic anemia: -Has been followed by GI Colonoscopy results reviewed  6. CKD stage IV: -Long discussion with her, we will try to avoid catheterization if possible though may be needed if she has severe AS needing TAVR  7. HTN: -Blood pressure starting to run high on reduced doses of Coreg and losartan Could consider restarting outpatient dosing  8. HLD: -Last LDL 83 with goal LDL < 70  Very complicated  patient, multiorgan system failure, Long discussion with patient and family, all questions answered  Total encounter time more than 50 minutes  Greater than 50% was spent in counseling and coordination of care with the patient   Signed, Esmond Plants, MD, Ph.D [facility]

## 2021-04-25 NOTE — Evaluation (Signed)
Occupational Therapy Evaluation Patient Details Name: [name] MRN: [id number] DOB: 1956-01-07 Today's Date: 04/25/2021   History of Present Illness 66 y.o. female with medical history significant for PVD s/p bilateral BKA, CAD s/p CABG x4, DM with neuropathy, complex partial seizures on Keppra, HTN, chronic pain syndrome, severe aortic stenosis on echocardiogram in November 2022 with EF 60-65%(improved from prior of 35%) who presents to the ED following a witnessed syncopal episode while in car with son. Patient had a fall earlier in the day; she was unable to stand up from the floor so EMS was called for floor transfer. This fall resulted in closed fracture of R pubic ramus.   Clinical Impression   Patient presenting with decreased Ind in self care, balance, functional mobility/transfers,endurance, and safety awareness. Patient reports living at home with family. She reports being mod I with all self care and functional transfers but also endorses children assist her as needed. Pt needing mod lifting assistance to stand from elevated surface. Min A for standing balance with several side steps with B LE prosthetics donned. Pt was able to doff them with increased time and supervision for safety. Pt has a very specific set up for self care needs and did not feel that any suggestions/change to the way she currently does things would be helpful. OT suggested that [facility] assess her home set up further after hospital discharge. Patient will benefit from acute OT to increase overall independence in the areas of ADLs, functional mobility, and safety awareness in order to safely discharge home with family to assist.      Recommendations for follow up therapy are one component of a multi-disciplinary discharge planning process, led by the attending physician.  Recommendations may be updated based on patient status, additional functional criteria and insurance authorization.   Follow Up Recommendations  Home  health OT    Assistance Recommended at Discharge Frequent or constant Supervision/Assistance  Patient can return home with the following A lot of help with walking and/or transfers;A lot of help with bathing/dressing/bathroom;Assistance with cooking/housework;Help with stairs or ramp for entrance;Assist for transportation    Functional Status Assessment  Patient has had a recent decline in their functional status and demonstrates the ability to make significant improvements in function in a reasonable and predictable amount of time.  Equipment Recommendations  BSC/3in1;Tub/shower bench       Precautions / Restrictions Precautions Precautions: Fall Restrictions Weight Bearing Restrictions: Yes RLE Weight Bearing: Weight bearing as tolerated LLE Weight Bearing: Weight bearing as tolerated      Mobility Bed Mobility Overal bed mobility: Needs Assistance Bed Mobility: Sit to Supine       Sit to supine: Min guard   General bed mobility comments: increased effort and pt utilized bed rails as needed.    Transfers Overall transfer level: Needs assistance Equipment used: Rolling walker (2 wheels) Transfers: Sit to/from Stand, Bed to chair/wheelchair/BSC Sit to Stand: Mod assist, From elevated surface Stand pivot transfers: Mod assist         General transfer comment: MOD A to lift and steady (posterior and leftward lean upon standing).      Balance Overall balance assessment: Needs assistance, History of Falls Sitting-balance support: Bilateral upper extremity supported, Feet supported Sitting balance-Leahy Scale: Fair     Standing balance support: Bilateral upper extremity supported, During functional activity, Reliant on assistive device for balance Standing balance-Leahy Scale: Poor  ADL either performed or assessed with clinical judgement   ADL Overall ADL's : Needs assistance/impaired                              Toileting- Clothing Manipulation and Hygiene: Moderate assistance;Sit to/from stand Toileting - Clothing Manipulation Details (indicate cue type and reason): simulated     Functional mobility during ADLs: Moderate assistance;Minimal assistance;Rolling walker (2 wheels) General ADL Comments: Pt doffs B LE prosthetics with increased time. Pt fatigues quickly. She performs several side steps with min A and use of RW. Assistance to apply cream to buttocks and change hospital gown one seated on EOB.     Vision Patient Visual Report: No change from baseline              Pertinent Vitals/Pain Pain Assessment Pain Assessment: 0-10 Pain Score: 6  Pain Location: pelvis Pain Descriptors / Indicators: Aching, Discomfort, Grimacing, Guarding Pain Intervention(s): Limited activity within patient's tolerance, Monitored during session, Premedicated before session, Repositioned        Extremity/Trunk Assessment Upper Extremity Assessment Upper Extremity Assessment: Overall WFL for tasks assessed   Lower Extremity Assessment Lower Extremity Assessment: Defer to PT evaluation RLE Deficits / Details: pt reporting significant pain with all RLE mobility. Partial AROM was witnessed in all planes. RLE: Unable to fully assess due to pain       Communication Communication Communication: No difficulties   Cognition Arousal/Alertness: Awake/alert Behavior During Therapy: WFL for tasks assessed/performed Overall Cognitive Status: Within Functional Limits for tasks assessed                                 General Comments: A &Ox4.                Home Living Family/patient expects to be discharged to:: Private residence Living Arrangements: Spouse/significant other;Children Available Help at Discharge: Family;Available 24 hours/day Type of Home: House Home Access: Ramped entrance     Home Layout: One level     Bathroom Shower/Tub: Walk-in shower         Home  Equipment: BSC/3in1;Wheelchair - manual;Shower seat   Additional Comments: States her rollator and shower seat are broken. Husband is not able to provide physical assist. Has 2 sons, one is in and out and the other is present however does work. Both son's are able to provide assistance when home.      Prior Functioning/Environment Prior Level of Function : Needs assist       Physical Assist : Mobility (physical);ADLs (physical)     Mobility Comments: States she is modified independent with short distance ambulation to foot of bed in order to access [facility] and/or w/c. Later in session states her son helps her sometimes. Reports 3 falls in the last 6 months. Does not currently ambulate due to pressure injuries on bilateral residual limbs (>6 months - pt states they are not healing). She does not use an AD for transfers, rather holds onto arm rests and other objects for stability. ADLs Comments: Pt initially states she is independent with ADLs and requires assist with IADLs. Later in session, pt states that sometimes her son helps her with hygiene when needed.        OT Problem List: Decreased strength;Decreased activity tolerance;Impaired balance (sitting and/or standing);Decreased safety awareness;Pain      OT Treatment/Interventions: Self-care/ADL training;Therapeutic exercise;Therapeutic activities;Energy conservation;DME and/or AE instruction;Patient/family  education;Manual therapy;Balance training    OT Goals(Current goals can be found in the care plan section) Acute Rehab OT Goals Patient Stated Goal: to go home OT Goal Formulation: With patient/family Time For Goal Achievement: 05/09/21 Potential to Achieve Goals: Good ADL Goals Pt Will Perform Grooming: with modified independence Pt Will Perform Lower Body Dressing: with modified independence Pt Will Transfer to Toilet: with modified independence Pt Will Perform Toileting - Clothing Manipulation and hygiene: with modified  independence  OT Frequency: Min 2X/week       AM-PAC OT "6 Clicks" Daily Activity     Outcome Measure Help from another person eating meals?: None Help from another person taking care of personal grooming?: A Little Help from another person toileting, which includes using toliet, bedpan, or urinal?: A Lot Help from another person bathing (including washing, rinsing, drying)?: A Lot Help from another person to put on and taking off regular upper body clothing?: A Little Help from another person to put on and taking off regular lower body clothing?: A Lot 6 Click Score: 16   End of Session Equipment Utilized During Treatment: Rolling walker (2 wheels) Nurse Communication: Mobility status  Activity Tolerance: Patient tolerated treatment well Patient left: in bed;with call bell/phone within reach;with bed alarm set;with family/visitor present  OT Visit Diagnosis: Unsteadiness on feet (R26.81);Muscle weakness (generalized) (M62.81);Repeated falls (R29.6)                Time: 2836-6294 OT Time Calculation (min): 25 min Charges:  OT General Charges $OT Visit: 1 Visit OT Evaluation $OT Eval Moderate Complexity: 1 Mod OT Treatments $Self Care/Home Management : 8-22 mins  Darleen Crocker, MS, OTR/L , CBIS ascom [phone]  04/25/21, 1:46 PM

## 2021-04-25 NOTE — Evaluation (Addendum)
Physical Therapy Evaluation Patient Details Name: [name] MRN: [id number] DOB: [birth year] Today's Date: 04/25/2021  History of Present Illness  66 y.o. female with medical history significant for PVD s/p bilateral BKA, CAD s/p CABG x4, DM with neuropathy, complex partial seizures on Keppra, HTN, chronic pain syndrome, severe aortic stenosis on echocardiogram in November 2022 with EF 60-65%(improved from prior of 35%) who presents to the ED following a witnessed syncopal episode while in car with son. Patient had a fall earlier in the day; she was unable to stand up from the floor so EMS was called for floor transfer. This fall resulted in closed fracture of R pubic ramus.   Clinical Impression  Pt received supine in bed, agreeable to therapy. Husband and son enter [place]. Pt lives with her husband and 2 sons in a single level home with ah ramped entrance. She typically uses w/c for mobility. She does perform stand-pivot transfers and is able to ambulate ~28ft to the end of her bed. Ambulation has been limited over the past 6 months due to non-healing pressure injuries on bilateral residual limbs. Pt gives mixed report on independence vs assist during mobility and ADLs.   Pt performed bed mobility with CGA; heavy reliance on HOB being elevated and bed rails. Pt donned bilateral prosthetic limbs while seated EOB; increased time required to complete. STS and stand pivot transfer performed with MOD A for lifting and steadying. PT guiding hips to sitting surface as pt performs with haste.  Pt lacks safety awareness and is not open to attempting mobility alternatives with safer technique or improved body mechanics. Ambulation was attempted without success. Recommendations were provided to pt and family regarding equipment setup at home and heavily encouraged to use RW rather than random objects as this is what led to pt's fall. During session, pt was incontinent of bowels and required increased time  on [facility] as it appeared she was unsure of when she was complete. She also has difficulty following instructions leading to increased clean up after using BSC. Pt and son appear confident in d/c home from acute setting - PT rec assistance with all transfers at home and use of DME that has been ordered for pt to increase safety. Pt would benefit from HHPT to address above deficits and promote optimal return to PLOF.      Recommendations for follow up therapy are one component of a multi-disciplinary discharge planning process, led by the attending physician.  Recommendations may be updated based on patient status, additional functional criteria and insurance authorization.  Follow Up Recommendations Home health PT    Assistance Recommended at Discharge Intermittent Supervision/Assistance (assist with all mobility; okay once in bed or w/c)  Patient can return home with the following  A lot of help with bathing/dressing/bathroom;A lot of help with walking and/or transfers;Assist for transportation;Help with stairs or ramp for entrance;Assistance with cooking/housework    Equipment Recommendations Rolling walker (2 wheels);BSC/3in1;Other (comment) (shower chair)  Recommendations for Other Services       Functional Status Assessment Patient has had a recent decline in their functional status and demonstrates the ability to make significant improvements in function in a reasonable and predictable amount of time.     Precautions / Restrictions Precautions Precautions: Fall Restrictions Weight Bearing Restrictions: Yes RLE Weight Bearing: Weight bearing as tolerated      Mobility  Bed Mobility Overal bed mobility: Needs Assistance Bed Mobility: Supine to Sit     Supine to sit: Min guard, HOB  elevated     General bed mobility comments: increased time and effort. CGA to stabilize trunk as pt was transferring hand from R to L side    Transfers Overall transfer level: Needs  assistance Equipment used: Rolling walker (2 wheels) Transfers: Sit to/from Stand, Bed to chair/wheelchair/BSC Sit to Stand: Mod assist, From elevated surface Stand pivot transfers: Mod assist         General transfer comment: MOD A to lift and steady (posterior and leftward lean upon standing). During pt, pt demo decreased safety awareness and does not following VC. Steadying and guidance of hips provided for safe landing to deatination. Performed EOB<>BSC. Pt unable to maintain hip extension during transfer.    Ambulation/Gait               General Gait Details: Attempted forward ambulation (pt has to perform ~25ft at home). Pt unable. Pt not willing to attempt using RW or with BUE support on bed with lateral steps (attempting to simulate home set up).  Stairs            Wheelchair Mobility    Modified Rankin (Stroke Patients Only)       Balance Overall balance assessment: Needs assistance, History of Falls Sitting-balance support: Bilateral upper extremity supported, Feet supported Sitting balance-Leahy Scale: Fair Sitting balance - Comments: required VC to scoot hips onto sitting surface. Good balance once B hips are supported.   Standing balance support: Bilateral upper extremity supported, During functional activity Standing balance-Leahy Scale: Poor Standing balance comment: Requires BUE support - improved during static standing with RW. Refuses AD during stand-pivot transfer - pt reaching for bed/armrests to stabilize with poor balance.                             Pertinent Vitals/Pain Pain Assessment Pain Assessment: 0-10 Pain Score: 8  Pain Location: pelvis Pain Descriptors / Indicators: Aching, Discomfort, Grimacing, Guarding    Home Living Family/patient expects to be discharged to:: Private residence Living Arrangements: Spouse/significant other;Children Available Help at Discharge: Family;Available 24 hours/day Type of Home:  House Home Access: Ramped entrance       Home Layout: One level Home Equipment: BSC/3in1;Wheelchair - manual Additional Comments: States her rollator and shower seat are broken. Husband is not able to provide physical assist. Has 2 sons, one is in and out and the other is present however does work. Both son's are able to provide assistance when home.    Prior Function Prior Level of Function : Needs assist       Physical Assist : Mobility (physical);ADLs (physical)     Mobility Comments: States she is modified independent with short distance ambulation to foot of bed in order to access [facility] and/or w/c. Later in session states her son helps her sometimes. Reports 3 falls in the last 6 months. Does not currently ambulate due to pressure injuries on bilateral residual limbs (>6 months - pt states they are not healing). She does not use an AD for transfers, rather holds onto arm rests and other objects for stability. ADLs Comments: Pt initially states she is independent with ADLs and requires assist with IADLs. Later in session, pt states that sometimes her son helps her with hygiene.     Hand Dominance        Extremity/Trunk Assessment   Upper Extremity Assessment Upper Extremity Assessment: Overall WFL for tasks assessed    Lower Extremity Assessment Lower Extremity Assessment: RLE  deficits/detail RLE Deficits / Details: pt reporting significant pain with all RLE mobility. Partial AROM was witnessed in all planes. RLE: Unable to fully assess due to pain       Communication   Communication: No difficulties  Cognition Arousal/Alertness: Awake/alert Behavior During Therapy: WFL for tasks assessed/performed Overall Cognitive Status: Within Functional Limits for tasks assessed                                 General Comments: A&Ox4. Pleasant however not willing to practice different/safer mobility methods. Occasionally forgetful to instructions.        General  Comments      Exercises Other Exercises Other Exercises: Pt remained sitting on BSC for extended period of time. Attempted standing on 3 occasions when pt reports she is ready however BM was not complete. Increased time spent educating pt on different/safer techniques and encouraging use of RW. Attempted to problem solve pt not needing to ambulate short distances once home due to pain 2/2 pubic ramus fx leading to decreased safety. Pt and son reporting pt will be able to ambulate once home. Linen change required due to pt not following instructions after using BSC.   Assessment/Plan    PT Assessment Patient needs continued PT services  PT Problem List Decreased strength;Decreased activity tolerance;Decreased balance;Decreased mobility;Decreased safety awareness       PT Treatment Interventions Therapeutic activities;DME instruction;Therapeutic exercise;Balance training;Functional mobility training;Patient/family education    PT Goals (Current goals can be found in the Care Plan section)  Acute Rehab PT Goals Patient Stated Goal: to go home PT Goal Formulation: With patient Time For Goal Achievement: 05/09/21 Potential to Achieve Goals: Fair    Frequency Min 2X/week     Co-evaluation               AM-PAC PT "6 Clicks" Mobility  Outcome Measure Help needed turning from your back to your side while in a flat bed without using bedrails?: A Little Help needed moving from lying on your back to sitting on the side of a flat bed without using bedrails?: A Little Help needed moving to and from a bed to a chair (including a wheelchair)?: A Lot Help needed standing up from a chair using your arms (e.g., wheelchair or bedside chair)?: A Lot Help needed to walk in hospital room?: Total Help needed climbing 3-5 steps with a railing? : Total 6 Click Score: 12    End of Session Equipment Utilized During Treatment: Gait belt Activity Tolerance: Patient limited by pain Patient left: in  bed;Other (comment);with family/visitor present (handoff to OT with pt sitting EOB) Nurse Communication: Mobility status PT Visit Diagnosis: Unsteadiness on feet (R26.81);Other abnormalities of gait and mobility (R26.89);Muscle weakness (generalized) (M62.81);History of falling (Z91.81);Difficulty in walking, not elsewhere classified (R26.2)    Time: 0076-2263 PT Time Calculation (min) (ACUTE ONLY): 61 min   Charges:   PT Evaluation $PT Eval High Complexity: 1 High PT Treatments $Therapeutic Activity: 53-67 mins        Patrina Levering PT, DPT 04/25/21 1:50 PM [phone]

## 2021-04-25 NOTE — Progress Notes (Signed)
Pt equipment arrived from [place]. Assisted pt to get dress, IV access already taken out by day nurse. Pt wheeled to the car with his son.

## 2021-04-25 NOTE — TOC Progression Note (Addendum)
Transition of Care [facility]) - Progression Note    Patient Details  Name: [name] MRN: [id number] Date of Birth: September 26, 1955  [facility]) CM/SW [place], LCSW Phone Number: 04/25/2021, 3:12 PM  Clinical Narrative:   Patient and family agreeable to home health. [facility] unable to accept. [facility] is checking to see if they can accept. Ordered RW, 3-in-1, and tub transfer bench through [employer]. Patient and family understand that tub transfer bench will be a private pay item.   3:45 pm: [facility] and [facility] are unable to accept. [facility] is running her insurance. Left messages for [facility] and [facility].  4:41 pm: [facility], and [facility] unable to accept. Left voicemail for [facility]. Faxed referral to [facility]   Expected Discharge Plan: [place] Barriers to Discharge: Continued Medical Work up  Expected Discharge Plan and Services Expected Discharge Plan: [place] Choice:  (TBD) Living arrangements for the past 2 months: Single Family Home Expected Discharge Date: 04/25/21                                     Social Determinants of Health (SDOH) Interventions    Readmission Risk Interventions Readmission Risk Prevention Plan 04/24/2021  Transportation Screening Complete  PCP or Specialist Appt within 3-5 Days Complete  Social Work Consult for [place] Planning/Counseling [place] Not Applicable  Medication Review [employer] photographer) Complete  Some recent data might be hidden

## 2021-04-25 NOTE — TOC Transition Note (Signed)
Transition of Care [facility]) - CM/SW Discharge Note   Patient Details  Name: [name] MRN: [id number] Date of Birth: Sep 20, 1955  [facility] CM/SW Contact:  Candie Chroman, LCSW Phone Number: 04/25/2021, 4:58 PM   Clinical Narrative:  Patient has orders to discharge home today. [facility] can accept for PT and OT. DME has not been delivered to the room yet. No further concerns. CSW signing off.   Final next level of care: [place] Barriers to Discharge: Barriers Resolved   Patient Goals and CMS Choice     Choice offered to / list presented to : Patient, Spouse, Adult Children  Discharge Placement                Patient to be transferred to facility by: Family Name of family member notified: Denyse Amass and [place] Patient and family notified of of transfer: 04/25/21  Discharge Plan and Services     Post Acute Care Choice:  (TBD)          DME Arranged: Gilford Rile rolling, Tub bench, 3-N-1 DME Agency: [facility] Date DME Agency Contacted: 04/25/21   Representative spoke with at DME Agency: Freda Munro HH Arranged: PT, OT [facility] Agency: [facility] Date [place]: 04/25/21   Representative spoke with at [place]: [place] Interventions     Readmission Risk Interventions Readmission Risk Prevention Plan 04/24/2021  Transportation Screening Complete  PCP or Specialist Appt within 3-5 Days Complete  Social Work Consult for [place] Planning/Counseling [place] Not Applicable  Medication Review Press photographer) Complete  Some recent data might be hidden

## 2021-04-25 NOTE — Discharge Summary (Signed)
Physician Discharge Summary  [name] HWE:[id number] DOB: 04-11-1955 DOA: 04/21/2021  PCP: [name], MD  Admit date: 04/21/2021 Discharge date: 04/25/2021  Admitted From: Home Disposition:  Home with home health  Recommendations for Outpatient Follow-up:  Follow up with PCP in 1-2 weeks FU with [facility] cardiology  Home Health:Yes PT OT Equipment/Devices:None   Discharge Condition:Stable  CODE STATUS:FULL  Diet recommendation: Cardiac  Brief/Interim Summary: 66 y.o. female with a PMH significant for PVD s/p bilateral BKA, CAD s/p CABG x4, DM with neuropathy, complex partial seizures on Keppra, HTN, chronic pain syndrome, severe aortic stenosis on echocardiogram in November 2022 with EF 60-65% (improved from prior of 35%). They presented from home to the ED on 04/21/2021 with syncopal episode. She endorses having several episodes of hematochezia/bloody tissue after Bms in the past few days.  In the ED, it was found that they had hypotension, hypoxia requiring 2L O2, hgb 8.4, elevated troponin to 876. They were treated with ACS rule out, EEG, echo, PE study.  Significant results were that her hgb decreased rapidly to 7.5 requiring transfusion and demonstrated severe AS on echo.  GI and cardiology were consulted.  S/p colonoscopy, several polyps, no active bleeding. Stable for discharge on post scope day #1    Discharge Diagnoses:  Principal Problem:   Syncope and collapse Active Problems:   Uncontrolled type 2 diabetes mellitus with hyperglycemia, with long-term current use of insulin (HCC)   Acute on chronic anemia , possible acute blood loss anemia   Essential hypertension   History of carotid endarterectomy, right   PVD (peripheral vascular disease) (HCC)   Localization-related symptomatic epilepsy and epileptic syndromes with complex partial seizures, not intractable, without status epilepticus (HCC)   Chronic pain syndrome   High risk medication use   S/P CABG x 4    Aortic valve stenosis   Acute metabolic encephalopathy   Acute respiratory failure with hypoxia ([place])   Fall at home, initial encounter   Closed fracture of pubic ramus, right, initial encounter ([place])   NSTEMI (non-ST elevated myocardial infarction) ([place])   Acute kidney injury superimposed on chronic kidney disease IV (HCC)   Hypotension   Chronic diastolic CHF (congestive heart failure) (HCC)   Hematochezia   Pressure injury of skin   Syncope   HFrEF (heart failure with reduced ejection fraction) (HCC)  Syncope-  multifactorial as described below.  Likely related to GI bleed versus cardiac valvulopathy versus internal carotid disease.  Unable to exclude arrhythmia genic versus vasovagal syncope Plan: Therapy evaluations [facility] Home with home health   Symptomatic anemia Suspect underlying GI bleed Three-point drop in hemoglobin since admission Status post 1 unit PRBC 2/6 Hemoglobin stable C scope negative Plan: DC home.  Outpatient GI followup   Hypotension- resolved. Home antihypertensives have been restarted Transient hypotension likely driving syncopal event Instruct patient to monitor BP at home and discuss with [facility] cardiologists    NSTEMI (non-ST elevated myocardial infarction) [facility]), ruled out CAD S/P CABG x 4 Severe aortic stenosis Troponin elevation without significant delta  Secondary to supply/demand ischemia  Heparin GGT stopped 2/7 Plan: Monitor hemoglobin Add low-dose aspirin at time of discharge Outpatient follow-up for heart catheterization versus gated CTA for TAVR consideration    Acute kidney injury superimposed on CKD IV improved, creatinine baseline Monitor renal function and avoid nephrotoxins   Acute respiratory failure with hypoxia ([place])  resolved. ORA   Closed fracture of pubic ramus, right, initial encounter (HCC) Pain control PRN PT eval  Fall at home, initial encounter Suspect secondary to acute medical condition Treat  acute medical condition PT eval   Uncontrolled type 2 diabetes mellitus with hyperglycemia, with long-term current use of insulin (HCC) patient previously refusing CBG monitoring and insulin but is now agreeable to long-acting Sliding scale insulin coverage if able Basal insulin   Acute metabolic encephalopathy(present on admission)  resolved Suspect secondary to hypotension, with decreased perfusion CT head nonacute     Localization-related symptomatic epilepsy and epileptic syndromes with complex partial seizures, not intractable, without status epilepticus ([place])- (present on admission) Continue Keppra   PVD (peripheral vascular disease) ([place])- (present on admission) Continue aspirin and statins.  Patient is s/p BKA   History of carotid endarterectomy, right Continue aspirin and statins   Chronic diastolic CHF (congestive heart failure) (HCC) Currently euvolemic to dry Chest x-ray clear but BNP elevated to the 500s Torsemide, Coreg, losartan restarted   Chronic pain syndrome- (present on admission) Continue Lyrica   Essential hypertension- (present on admission) Antihypertensive has been restarted   Depression/anxiety-  - continue home medications  Discharge Instructions  Discharge Instructions     Diet - low sodium heart healthy   Complete by: As directed    Increase activity slowly   Complete by: As directed    No wound care   Complete by: As directed       Allergies as of 04/25/2021       Reactions   Metformin Hcl Nausea And Vomiting   Ace Inhibitors Cough   cough   Actos [pioglitazone] Swelling   Atorvastatin Other (See Comments)   Other reaction(s): Other (See Comments) myalgias   Candesartan Cilexetil Other (See Comments)   Rash "cant recall"    Cephalexin    Other reaction(s): stomach upset   Gabapentin Other (See Comments)   Dizziness and unbalanced   Influenza Vaccines    Other reaction(s): nearly killed me   Isosorbide Nitrate Other (See  Comments)   HA.   Quinapril Hcl Cough   Tramadol Hcl Other (See Comments)   seziure   Tape Rash   Redness, rash, itchiness Redness, rash, itchiness Redness, rash, itchiness No Plastic tape, paper tape OK Redness, rash, itchiness Redness, rash, itchiness No Plastic tape, paper tape OK        Medication List     STOP taking these medications    acetaminophen-codeine 300-30 MG tablet Commonly known as: TYLENOL #3   predniSONE 10 MG tablet Commonly known as: DELTASONE   ranolazine 500 MG 12 hr tablet Commonly known as: RANEXA       TAKE these medications    albuterol 108 (90 Base) MCG/ACT inhaler Commonly known as: VENTOLIN HFA Inhale 1-2 puffs into the lungs every 6 (six) hours as needed for wheezing or shortness of breath.   ALPRAZolam 0.5 MG tablet Commonly known as: XANAX Take 0.5 mg by mouth 2 (two) times daily as needed for anxiety.   aspirin 81 MG tablet Take 1 tablet (81 mg total) by mouth daily.   carvedilol 6.25 MG tablet Commonly known as: COREG Take 1 tablet (6.25 mg total) by mouth 2 (two) times daily.   clopidogrel 75 MG tablet Commonly known as: PLAVIX Take 1 tablet (75 mg total) by mouth daily.   cycloSPORINE 0.05 % ophthalmic emulsion Commonly known as: RESTASIS Place 1 drop into both eyes 2 (two) times daily as needed (dry eyes).   empagliflozin 10 MG Tabs tablet Commonly known as: JARDIANCE Take 10 mg by mouth daily.  ezetimibe 10 MG tablet Commonly known as: ZETIA Take 10 mg by mouth daily.   insulin lispro 100 UNIT/ML injection Commonly known as: HUMALOG Inject 12 Units into the skin 2 (two) times daily with a meal.   Lantus SoloStar 100 UNIT/ML Solostar Pen Generic drug: insulin glargine Inject 38 Units into the skin at bedtime.   Levetiracetam 750 MG Tb24 Take 2 tablets (1,500 mg total) by mouth at bedtime.   lidocaine 5 % Commonly known as: LIDODERM Place 1 patch onto the skin daily as needed (pain).   losartan 50  MG tablet Commonly known as: COZAAR Take 50 mg by mouth daily.   methocarbamol 500 MG tablet Commonly known as: ROBAXIN Take 1 tablet (500 mg total) by mouth 4 (four) times daily as needed for up to 10 days for muscle spasms.   multivitamins ther. w/minerals Tabs tablet Take 1 tablet by mouth daily.   nitroGLYCERIN 0.4 MG SL tablet Commonly known as: NITROSTAT Place 0.4 mg under the tongue every 5 (five) minutes as needed for chest pain.   potassium chloride SA 20 MEQ tablet Commonly known as: KLOR-CON M Take 20 mEq by mouth 2 (two) times daily.   pregabalin 50 MG capsule Commonly known as: LYRICA Take 50 mg by mouth daily as needed (severe pain).   rosuvastatin 40 MG tablet Commonly known as: CRESTOR Take 40 mg by mouth daily.   sertraline 100 MG tablet Commonly known as: ZOLOFT Take 1 tablet (100 mg total) by mouth 2 (two) times daily.   torsemide 20 MG tablet Commonly known as: DEMADEX Take 60 mg by mouth daily as needed (fluid/swelling).               Durable Medical Equipment  (From admission, onward)           Start     Ordered   04/25/21 1218  For home use only DME Tub bench  Once        04/25/21 1217   04/25/21 1217  For home use only DME 3 n 1  Once        04/25/21 1217   04/25/21 1116  For home use only DME Walker rolling  Once       Question Answer Comment  Walker: With 5 Inch Wheels   Patient needs a walker to treat with the following condition Weakness      04/25/21 1115   04/25/21 1116  For home use only DME Shower stool  Once        04/25/21 1115            Allergies  Allergen Reactions   Metformin Hcl Nausea And Vomiting   Ace Inhibitors Cough    cough   Actos [Pioglitazone] Swelling   Atorvastatin Other (See Comments)    Other reaction(s): Other (See Comments) myalgias   Candesartan Cilexetil Other (See Comments)    Rash "cant recall"    Cephalexin     Other reaction(s): stomach upset   Gabapentin Other (See Comments)     Dizziness and unbalanced   Influenza Vaccines     Other reaction(s): nearly killed me   Isosorbide Nitrate Other (See Comments)    HA.   Quinapril Hcl Cough   Tramadol Hcl Other (See Comments)    seziure   Tape Rash    Redness, rash, itchiness Redness, rash, itchiness Redness, rash, itchiness No Plastic tape, paper tape OK Redness, rash, itchiness Redness, rash, itchiness No Plastic tape, paper tape OK  Consultations: Cardiology GI   Procedures/Studies: DG Chest 2 View  Result Date: 04/21/2021 CLINICAL DATA:  Right hip pain after a fall. Sweating, confusion, syncope EXAM: CHEST - 2 VIEW COMPARISON:  02/20/2021 FINDINGS: Postoperative changes in the mediastinum. Cardiac enlargement. Lungs are clear. No pleural effusions. No pneumothorax. Mediastinal contours appear intact. IMPRESSION: No active cardiopulmonary disease. Electronically Signed   By: Lucienne Capers M.D.   On: 04/21/2021 18:38   CT HEAD WO CONTRAST (5MM)  Result Date: 04/21/2021 CLINICAL DATA:  Syncope versus seizure.  Anticoagulated. EXAM: CT HEAD WITHOUT CONTRAST TECHNIQUE: Contiguous axial images were obtained from the base of the skull through the vertex without intravenous contrast. RADIATION DOSE REDUCTION: This exam was performed according to the departmental dose-optimization program which includes automated exposure control, adjustment of the mA and/or kV according to patient size and/or use of iterative reconstruction technique. COMPARISON:  02/19/2021 FINDINGS: Brain: Generalized atrophy. Chronic small-vessel ischemic changes of the cerebral hemispheric white matter. No sign of acute infarction, mass lesion, hemorrhage, hydrocephalus or extra-axial collection. Vascular: There is atherosclerotic calcification of the major vessels at the base of the brain. Skull: Negative Sinuses/Orbits: Clear except for a retention cyst in the sphenoid sinus. Orbits negative. Other: None IMPRESSION: No acute finding. Volume  loss. Chronic small-vessel ischemic changes of the white matter. Atherosclerotic calcification of the major vessels at the base of the brain. Electronically Signed   By: Nelson Chimes M.D.   On: 04/21/2021 18:03   ECHOCARDIOGRAM COMPLETE  Result Date: 04/22/2021    ECHOCARDIOGRAM REPORT   Patient Name:   [name] Date of Exam: 04/22/2021 Medical Rec #:  [id number]        Height:       65.0 in Accession #:    [id number]       Weight:       190.0 lb Date of Birth:  [birth year]         BSA:          1.935 m Patient Age:    37 years         BP:           119/69 mmHg Patient Gender: F                HR:           86 bpm. Exam Location:  [facility] Procedure: 2D Echo, Color Doppler, Cardiac Doppler and Intracardiac            Opacification Agent Indications:     R55 Syncope  History:         Patient has prior history of Echocardiogram examinations, most                  recent 01/30/2021. CHF, CAD, Prior CABG, PVD; Risk                  Factors:Dyslipidemia and Diabetes.  Sonographer:     Charmayne Sheer Referring Phys:  [id number] Athena Masse Diagnosing Phys: Kathlyn Sacramento MD  Sonographer Comments: Suboptimal apical window and no subcostal window. IMPRESSIONS  1. Left ventricular ejection fraction, by estimation, is 35 to 40%. The left ventricle has moderately decreased function. Left ventricular endocardial border not optimally defined to evaluate regional wall motion. There is mild left ventricular hypertrophy. Left ventricular diastolic parameters are indeterminate.  2. Right ventricular systolic function is normal. The right ventricular size is normal. There is mildly elevated pulmonary artery systolic pressure.  3. Left atrial size  was moderately dilated.  4. The mitral valve is normal in structure. No evidence of mitral valve regurgitation. Mild to moderate mitral stenosis. The mean mitral valve gradient is 5.0 mmHg. Severe mitral annular calcification.  5. The aortic valve is calcified. Aortic valve regurgitation is  not visualized. Moderate to severe aortic valve stenosis. Aortic valve mean gradient measures 24.0 mmHg. Aortic valve is not well seen. Possible low flow-low gradient severe aortic stenosis. FINDINGS  Left Ventricle: Left ventricular ejection fraction, by estimation, is 35 to 40%. The left ventricle has moderately decreased function. Left ventricular endocardial border not optimally defined to evaluate regional wall motion. Definity contrast agent was given IV to delineate the left ventricular endocardial borders. The left ventricular internal cavity size was normal in size. There is mild left ventricular hypertrophy. Left ventricular diastolic parameters are indeterminate. Right Ventricle: The right ventricular size is normal. No increase in right ventricular wall thickness. Right ventricular systolic function is normal. There is mildly elevated pulmonary artery systolic pressure. The tricuspid regurgitant velocity is 3.03  m/s, and with an assumed right atrial pressure of 5 mmHg, the estimated right ventricular systolic pressure is 95.6 mmHg. Left Atrium: Left atrial size was moderately dilated. Right Atrium: Right atrial size was normal in size. Pericardium: There is no evidence of pericardial effusion. Mitral Valve: The mitral valve is normal in structure. Severe mitral annular calcification. No evidence of mitral valve regurgitation. Mild to moderate mitral valve stenosis. MV peak gradient, 9.1 mmHg. The mean mitral valve gradient is 5.0 mmHg. Tricuspid Valve: The tricuspid valve is normal in structure. Tricuspid valve regurgitation is mild . No evidence of tricuspid stenosis. Aortic Valve: The aortic valve is calcified. Aortic valve regurgitation is not visualized. Moderate to severe aortic stenosis is present. Aortic valve mean gradient measures 24.0 mmHg. Aortic valve peak gradient measures 37.9 mmHg. Aortic valve area, by VTI measures 0.91 cm. Pulmonic Valve: The pulmonic valve was normal in structure.  Pulmonic valve regurgitation is mild. No evidence of pulmonic stenosis. Aorta: The aortic root is normal in size and structure. Venous: The inferior vena cava was not well visualized. IAS/Shunts: No atrial level shunt detected by color flow Doppler.  LEFT VENTRICLE PLAX 2D LVIDd:         3.49 cm   Diastology LVIDs:         2.85 cm   LV e' medial:    3.59 cm/s LV PW:         1.05 cm   LV E/e' medial:  43.7 LV IVS:        0.80 cm   LV e' lateral:   6.09 cm/s LVOT diam:     2.00 cm   LV E/e' lateral: 25.8 LV SV:         47 LV SV Index:   24 LVOT Area:     3.14 cm  LEFT ATRIUM           Index LA diam:      4.50 cm 2.33 cm/m LA Vol (A4C): 67.7 ml 34.98 ml/m  AORTIC VALVE                     PULMONIC VALVE AV Area (Vmax):    0.79 cm      PV Vmax:       1.05 m/s AV Area (Vmean):   0.93 cm      PV Vmean:      69.500 cm/s AV Area (VTI):     0.91  cm      PV VTI:        0.177 m AV Vmax:           308.00 cm/s   PV Peak grad:  4.4 mmHg AV Vmean:          183.200 cm/s  PV Mean grad:  2.0 mmHg AV VTI:            0.516 m AV Peak Grad:      37.9 mmHg AV Mean Grad:      24.0 mmHg LVOT Vmax:         77.70 cm/s LVOT Vmean:        54.400 cm/s LVOT VTI:          0.149 m LVOT/AV VTI ratio: 0.29  AORTA Ao Root diam: 2.30 cm MITRAL VALVE                TRICUSPID VALVE MV Area (PHT): 3.77 cm     TR Peak grad:   36.7 mmHg MV Area VTI:   1.19 cm     TR Vmax:        303.00 cm/s MV Peak grad:  9.1 mmHg MV Mean grad:  5.0 mmHg     SHUNTS MV Vmax:       1.51 m/s     Systemic VTI:  0.15 m MV Vmean:      107.0 cm/s   Systemic Diam: 2.00 cm MV Decel Time: 201 msec MV E velocity: 157.00 cm/s MV A velocity: 140.00 cm/s MV E/A ratio:  1.12 Kathlyn Sacramento MD Electronically signed by Kathlyn Sacramento MD Signature Date/Time: 04/22/2021/10:31:39 AM    Final    DG Hip Unilat W or Wo Pelvis 2-3 Views Right  Result Date: 04/21/2021 CLINICAL DATA:  Right hip pain after a fall. EXAM: DG HIP (WITH OR WITHOUT PELVIS) 2-3V RIGHT COMPARISON:  Left femur  08/20/2018 FINDINGS: Degenerative changes in the lower lumbar spine and hips. Prior postoperative fixation of the left hip with short intramedullary rod and screw fixation. The right hip demonstrates no evidence of acute fracture or dislocation. There is cortical disruption and irregularity along the superior aspect of the right side of the symphysis pubis which possibly represents nondisplaced fracture. Symphysis pubis is not displaced. Visualized sacrum is intact. Vascular calcifications. IMPRESSION: 1. No evidence of acute fracture or displacement of the right hip. 2. Cortical irregularity consistent with nondisplaced fracture of the superior right pubic ramus. 3. Old fracture deformity of the left hip with postoperative fixation. Electronically Signed   By: Lucienne Capers M.D.   On: 04/21/2021 18:41      Subjective: Seen and examined on day of dc.  Stable for discharge home  Discharge Exam: Vitals:   04/25/21 0310 04/25/21 0743  BP: (!) 140/92 (!) 153/69  Pulse: 97 97  Resp: 20 18  Temp: 98 F (36.7 C) 98.4 F (36.9 C)  SpO2: 97% 97%   Vitals:   04/24/21 1810 04/24/21 2006 04/25/21 0310 04/25/21 0743  BP: (!) 158/71 (!) 161/77 (!) 140/92 (!) 153/69  Pulse: 92 100 97 97  Resp: 13 18 20 18   Temp:  98.7 F (37.1 C) 98 F (36.7 C) 98.4 F (36.9 C)  TempSrc:  Oral Oral Oral  SpO2: 100% 97% 97% 97%  Weight:      Height:        General: Pt is alert, awake, not in acute distress Cardiovascular: RRR, S1/S2 +, no rubs, no gallops Respiratory: CTA bilaterally,  no wheezing, no rhonchi Abdominal: Soft, NT, ND, bowel sounds + Extremities: no edema, no cyanosis, left BKA    The results of significant diagnostics from this hospitalization (including imaging, microbiology, ancillary and laboratory) are listed below for reference.     Microbiology: Recent Results (from the past 240 hour(s))  Resp Panel by RT-PCR (Flu A&B, Covid) Nasopharyngeal Swab     Status: None   Collection  Time: 04/21/21  5:40 PM   Specimen: Nasopharyngeal Swab; Nasopharyngeal(NP) swabs in vial transport medium  Result Value Ref Range Status   SARS Coronavirus 2 by RT PCR NEGATIVE NEGATIVE Final    Comment: (NOTE) SARS-CoV-2 target nucleic acids are NOT DETECTED.  The SARS-CoV-2 RNA is generally detectable in upper respiratory specimens during the acute phase of infection. The lowest concentration of SARS-CoV-2 viral copies this assay can detect is 138 copies/mL. A negative result does not preclude SARS-Cov-2 infection and should not be used as the sole basis for treatment or other patient management decisions. A negative result may occur with  improper specimen collection/handling, submission of specimen other than nasopharyngeal swab, presence of viral mutation(s) within the areas targeted by this assay, and inadequate number of viral copies(<138 copies/mL). A negative result must be combined with clinical observations, patient history, and epidemiological information. The expected result is Negative.  Fact Sheet for Patients:  EntrepreneurPulse.com.au  Fact Sheet for Healthcare Providers:  IncredibleEmployment.be  This test is no t yet approved or cleared by the [place] FDA and  has been authorized for detection and/or diagnosis of SARS-CoV-2 by FDA under an Emergency Use Authorization (EUA). This EUA will remain  in effect (meaning this test can be used) for the duration of the COVID-19 declaration under Section 564(b)(1) of the Act, 21 U.S.C.section 360bbb-3(b)(1), unless the authorization is terminated  or revoked sooner.       Influenza A by PCR NEGATIVE NEGATIVE Final   Influenza B by PCR NEGATIVE NEGATIVE Final    Comment: (NOTE) The Xpert Xpress SARS-CoV-2/FLU/RSV plus assay is intended as an aid in the diagnosis of influenza from Nasopharyngeal swab specimens and should not be used as a sole basis for treatment. Nasal washings  and aspirates are unacceptable for Xpert Xpress SARS-CoV-2/FLU/RSV testing.  Fact Sheet for Patients: EntrepreneurPulse.com.au  Fact Sheet for Healthcare Providers: IncredibleEmployment.be  This test is not yet approved or cleared by the [place] FDA and has been authorized for detection and/or diagnosis of SARS-CoV-2 by FDA under an Emergency Use Authorization (EUA). This EUA will remain in effect (meaning this test can be used) for the duration of the COVID-19 declaration under Section 564(b)(1) of the Act, 21 U.S.C. section 360bbb-3(b)(1), unless the authorization is terminated or revoked.  Performed at [facility], [address] [place], [place] [address]   MRSA Next Gen by PCR, Nasal     Status: None   Collection Time: 04/21/21 10:50 PM   Specimen: Nasal Mucosa; Nasal Swab  Result Value Ref Range Status   MRSA by PCR Next Gen NOT DETECTED NOT DETECTED Final    Comment: (NOTE) The GeneXpert MRSA Assay (FDA approved for NASAL specimens only), is one component of a comprehensive MRSA colonization surveillance program. It is not intended to diagnose MRSA infection nor to guide or monitor treatment for MRSA infections. Test performance is not FDA approved in patients less than 61 years old. Performed at [facility], [address] [place], [place] [address]      Labs: BNP (last 3 results)  Recent Labs    02/20/21 1318 04/21/21 1755  BNP 1,478.8* 952.8*   Basic Metabolic Panel: Recent Labs  Lab 04/21/21 1737 04/23/21 0404 04/24/21 0317  NA 134* 134* 136  K 4.7 4.5 3.9  CL 96* 98 101  CO2 25 25 26   GLUCOSE 316* 202* 192*  BUN 49* 44* 30*  CREATININE 2.58* 2.12* 1.69*  CALCIUM 8.5* 8.3* 8.7*  PHOS  --   --  3.3   Liver Function Tests: Recent Labs  Lab 04/21/21 1737 04/24/21 0317  AST 48*  --   ALT 29  --   ALKPHOS 52  --   BILITOT 0.7  --   PROT 6.8  --   ALBUMIN 3.3* 3.2*   No  results for input(s): LIPASE, AMYLASE in the last 168 hours. No results for input(s): AMMONIA in the last 168 hours. CBC: Recent Labs  Lab 04/21/21 1737 04/22/21 0312 04/22/21 0516 04/23/21 0404 04/24/21 0317  WBC 11.8*  --   --  10.3 9.8  NEUTROABS 9.9*  --   --   --   --   HGB 8.4* 7.5* 7.9* 9.3* 9.4*  HCT 28.2* 23.8* 25.3* 29.2* 29.6*  MCV 91.0  --   --  87.4 87.8  PLT 211  --   --  216 215   Cardiac Enzymes: No results for input(s): CKTOTAL, CKMB, CKMBINDEX, TROPONINI in the last 168 hours. BNP: Invalid input(s): POCBNP CBG: Recent Labs  Lab 04/24/21 1137 04/24/21 1544 04/24/21 2033 04/25/21 0740 04/25/21 1131  GLUCAP 158* 88 113* 74 260*   D-Dimer No results for input(s): DDIMER in the last 72 hours. Hgb A1c No results for input(s): HGBA1C in the last 72 hours. Lipid Profile No results for input(s): CHOL, HDL, LDLCALC, TRIG, CHOLHDL, LDLDIRECT in the last 72 hours. Thyroid function studies No results for input(s): TSH, T4TOTAL, T3FREE, THYROIDAB in the last 72 hours.  Invalid input(s): FREET3 Anemia work up No results for input(s): VITAMINB12, FOLATE, FERRITIN, TIBC, IRON, RETICCTPCT in the last 72 hours. Urinalysis    Component Value Date/Time   COLORURINE YELLOW 04/22/2021 2251   APPEARANCEUR CLEAR 04/22/2021 2251   LABSPEC <1.005 (L) 04/22/2021 2251   PHURINE 5.0 04/22/2021 2251   GLUCOSEU >1,000 (A) 04/22/2021 2251   HGBUR LARGE (A) 04/22/2021 2251   BILIRUBINUR NEGATIVE 04/22/2021 2251   KETONESUR NEGATIVE 04/22/2021 2251   PROTEINUR 30 (A) 04/22/2021 2251   UROBILINOGEN 0.2 09/02/2013 1440   NITRITE NEGATIVE 04/22/2021 2251   LEUKOCYTESUR NEGATIVE 04/22/2021 2251   Sepsis Labs Invalid input(s): PROCALCITONIN,  WBC,  LACTICIDVEN Microbiology Recent Results (from the past 240 hour(s))  Resp Panel by RT-PCR (Flu A&B, Covid) Nasopharyngeal Swab     Status: None   Collection Time: 04/21/21  5:40 PM   Specimen: Nasopharyngeal Swab;  Nasopharyngeal(NP) swabs in vial transport medium  Result Value Ref Range Status   SARS Coronavirus 2 by RT PCR NEGATIVE NEGATIVE Final    Comment: (NOTE) SARS-CoV-2 target nucleic acids are NOT DETECTED.  The SARS-CoV-2 RNA is generally detectable in upper respiratory specimens during the acute phase of infection. The lowest concentration of SARS-CoV-2 viral copies this assay can detect is 138 copies/mL. A negative result does not preclude SARS-Cov-2 infection and should not be used as the sole basis for treatment or other patient management decisions. A negative result may occur with  improper specimen collection/handling, submission of specimen other than nasopharyngeal swab, presence of viral mutation(s) within the areas targeted by this assay, and inadequate  number of viral copies(<138 copies/mL). A negative result must be combined with clinical observations, patient history, and epidemiological information. The expected result is Negative.  Fact Sheet for Patients:  EntrepreneurPulse.com.au  Fact Sheet for Healthcare Providers:  IncredibleEmployment.be  This test is no t yet approved or cleared by the [place] FDA and  has been authorized for detection and/or diagnosis of SARS-CoV-2 by FDA under an Emergency Use Authorization (EUA). This EUA will remain  in effect (meaning this test can be used) for the duration of the COVID-19 declaration under Section 564(b)(1) of the Act, 21 U.S.C.section 360bbb-3(b)(1), unless the authorization is terminated  or revoked sooner.       Influenza A by PCR NEGATIVE NEGATIVE Final   Influenza B by PCR NEGATIVE NEGATIVE Final    Comment: (NOTE) The Xpert Xpress SARS-CoV-2/FLU/RSV plus assay is intended as an aid in the diagnosis of influenza from Nasopharyngeal swab specimens and should not be used as a sole basis for treatment. Nasal washings and aspirates are unacceptable for Xpert Xpress  SARS-CoV-2/FLU/RSV testing.  Fact Sheet for Patients: EntrepreneurPulse.com.au  Fact Sheet for Healthcare Providers: IncredibleEmployment.be  This test is not yet approved or cleared by the [place] FDA and has been authorized for detection and/or diagnosis of SARS-CoV-2 by FDA under an Emergency Use Authorization (EUA). This EUA will remain in effect (meaning this test can be used) for the duration of the COVID-19 declaration under Section 564(b)(1) of the Act, 21 U.S.C. section 360bbb-3(b)(1), unless the authorization is terminated or revoked.  Performed at [facility], [address] [place], [place] [address]   MRSA Next Gen by PCR, Nasal     Status: None   Collection Time: 04/21/21 10:50 PM   Specimen: Nasal Mucosa; Nasal Swab  Result Value Ref Range Status   MRSA by PCR Next Gen NOT DETECTED NOT DETECTED Final    Comment: (NOTE) The GeneXpert MRSA Assay (FDA approved for NASAL specimens only), is one component of a comprehensive MRSA colonization surveillance program. It is not intended to diagnose MRSA infection nor to guide or monitor treatment for MRSA infections. Test performance is not FDA approved in patients less than 11 years old. Performed at [facility], [address], [place], [place] [address]      Time coordinating discharge: Over 30 minutes  SIGNED:   Sidney Ace, MD  [facility] 04/25/2021, 2:42 PM Pager   If 7PM-7AM, please contact night-coverage

## 2021-04-26 LAB — SURGICAL PATHOLOGY

## 2021-04-27 DIAGNOSIS — E1122 Type 2 diabetes mellitus with diabetic chronic kidney disease: Secondary | ICD-10-CM | POA: Diagnosis not present

## 2021-04-27 DIAGNOSIS — S32501D Unspecified fracture of right pubis, subsequent encounter for fracture with routine healing: Secondary | ICD-10-CM | POA: Diagnosis not present

## 2021-04-27 DIAGNOSIS — I471 Supraventricular tachycardia: Secondary | ICD-10-CM | POA: Diagnosis not present

## 2021-04-27 DIAGNOSIS — N179 Acute kidney failure, unspecified: Secondary | ICD-10-CM | POA: Diagnosis not present

## 2021-04-27 DIAGNOSIS — Q233 Congenital mitral insufficiency: Secondary | ICD-10-CM | POA: Diagnosis not present

## 2021-04-27 DIAGNOSIS — I5020 Unspecified systolic (congestive) heart failure: Secondary | ICD-10-CM | POA: Diagnosis not present

## 2021-04-27 DIAGNOSIS — E785 Hyperlipidemia, unspecified: Secondary | ICD-10-CM | POA: Diagnosis not present

## 2021-04-27 DIAGNOSIS — Z7902 Long term (current) use of antithrombotics/antiplatelets: Secondary | ICD-10-CM | POA: Diagnosis not present

## 2021-04-27 DIAGNOSIS — E11610 Type 2 diabetes mellitus with diabetic neuropathic arthropathy: Secondary | ICD-10-CM | POA: Diagnosis not present

## 2021-04-27 DIAGNOSIS — I5032 Chronic diastolic (congestive) heart failure: Secondary | ICD-10-CM | POA: Diagnosis not present

## 2021-04-27 DIAGNOSIS — J9601 Acute respiratory failure with hypoxia: Secondary | ICD-10-CM | POA: Diagnosis not present

## 2021-04-27 DIAGNOSIS — E1165 Type 2 diabetes mellitus with hyperglycemia: Secondary | ICD-10-CM | POA: Diagnosis not present

## 2021-04-27 DIAGNOSIS — I70219 Atherosclerosis of native arteries of extremities with intermittent claudication, unspecified extremity: Secondary | ICD-10-CM | POA: Diagnosis not present

## 2021-04-27 DIAGNOSIS — L89893 Pressure ulcer of other site, stage 3: Secondary | ICD-10-CM | POA: Diagnosis not present

## 2021-04-27 DIAGNOSIS — N183 Chronic kidney disease, stage 3 unspecified: Secondary | ICD-10-CM | POA: Diagnosis not present

## 2021-04-27 DIAGNOSIS — G894 Chronic pain syndrome: Secondary | ICD-10-CM | POA: Diagnosis not present

## 2021-04-27 DIAGNOSIS — I2510 Atherosclerotic heart disease of native coronary artery without angina pectoris: Secondary | ICD-10-CM | POA: Diagnosis not present

## 2021-04-27 DIAGNOSIS — I130 Hypertensive heart and chronic kidney disease with heart failure and stage 1 through stage 4 chronic kidney disease, or unspecified chronic kidney disease: Secondary | ICD-10-CM | POA: Diagnosis not present

## 2021-04-27 DIAGNOSIS — E1151 Type 2 diabetes mellitus with diabetic peripheral angiopathy without gangrene: Secondary | ICD-10-CM | POA: Diagnosis not present

## 2021-04-27 DIAGNOSIS — D631 Anemia in chronic kidney disease: Secondary | ICD-10-CM | POA: Diagnosis not present

## 2021-04-27 DIAGNOSIS — E1140 Type 2 diabetes mellitus with diabetic neuropathy, unspecified: Secondary | ICD-10-CM | POA: Diagnosis not present

## 2021-04-27 DIAGNOSIS — E1169 Type 2 diabetes mellitus with other specified complication: Secondary | ICD-10-CM | POA: Diagnosis not present

## 2021-04-29 DIAGNOSIS — D631 Anemia in chronic kidney disease: Secondary | ICD-10-CM | POA: Diagnosis not present

## 2021-04-29 DIAGNOSIS — L89893 Pressure ulcer of other site, stage 3: Secondary | ICD-10-CM | POA: Diagnosis not present

## 2021-04-29 DIAGNOSIS — E1151 Type 2 diabetes mellitus with diabetic peripheral angiopathy without gangrene: Secondary | ICD-10-CM | POA: Diagnosis not present

## 2021-04-29 DIAGNOSIS — J9601 Acute respiratory failure with hypoxia: Secondary | ICD-10-CM | POA: Diagnosis not present

## 2021-04-29 DIAGNOSIS — Z7902 Long term (current) use of antithrombotics/antiplatelets: Secondary | ICD-10-CM | POA: Diagnosis not present

## 2021-04-29 DIAGNOSIS — G894 Chronic pain syndrome: Secondary | ICD-10-CM | POA: Diagnosis not present

## 2021-04-29 DIAGNOSIS — E11610 Type 2 diabetes mellitus with diabetic neuropathic arthropathy: Secondary | ICD-10-CM | POA: Diagnosis not present

## 2021-04-29 DIAGNOSIS — E1169 Type 2 diabetes mellitus with other specified complication: Secondary | ICD-10-CM | POA: Diagnosis not present

## 2021-04-29 DIAGNOSIS — E1122 Type 2 diabetes mellitus with diabetic chronic kidney disease: Secondary | ICD-10-CM | POA: Diagnosis not present

## 2021-04-29 DIAGNOSIS — E1140 Type 2 diabetes mellitus with diabetic neuropathy, unspecified: Secondary | ICD-10-CM | POA: Diagnosis not present

## 2021-04-29 DIAGNOSIS — I471 Supraventricular tachycardia: Secondary | ICD-10-CM | POA: Diagnosis not present

## 2021-04-29 DIAGNOSIS — I5020 Unspecified systolic (congestive) heart failure: Secondary | ICD-10-CM | POA: Diagnosis not present

## 2021-04-29 DIAGNOSIS — N183 Chronic kidney disease, stage 3 unspecified: Secondary | ICD-10-CM | POA: Diagnosis not present

## 2021-04-29 DIAGNOSIS — E1165 Type 2 diabetes mellitus with hyperglycemia: Secondary | ICD-10-CM | POA: Diagnosis not present

## 2021-04-29 DIAGNOSIS — S32501D Unspecified fracture of right pubis, subsequent encounter for fracture with routine healing: Secondary | ICD-10-CM | POA: Diagnosis not present

## 2021-04-29 DIAGNOSIS — I130 Hypertensive heart and chronic kidney disease with heart failure and stage 1 through stage 4 chronic kidney disease, or unspecified chronic kidney disease: Secondary | ICD-10-CM | POA: Diagnosis not present

## 2021-04-29 DIAGNOSIS — N179 Acute kidney failure, unspecified: Secondary | ICD-10-CM | POA: Diagnosis not present

## 2021-04-29 DIAGNOSIS — I70219 Atherosclerosis of native arteries of extremities with intermittent claudication, unspecified extremity: Secondary | ICD-10-CM | POA: Diagnosis not present

## 2021-04-29 DIAGNOSIS — I2510 Atherosclerotic heart disease of native coronary artery without angina pectoris: Secondary | ICD-10-CM | POA: Diagnosis not present

## 2021-04-29 DIAGNOSIS — Q233 Congenital mitral insufficiency: Secondary | ICD-10-CM | POA: Diagnosis not present

## 2021-04-29 DIAGNOSIS — E785 Hyperlipidemia, unspecified: Secondary | ICD-10-CM | POA: Diagnosis not present

## 2021-04-29 DIAGNOSIS — I5032 Chronic diastolic (congestive) heart failure: Secondary | ICD-10-CM | POA: Diagnosis not present

## 2021-05-17 DIAGNOSIS — E1142 Type 2 diabetes mellitus with diabetic polyneuropathy: Secondary | ICD-10-CM | POA: Diagnosis not present

## 2021-05-21 ENCOUNTER — Encounter: Payer: Self-pay | Admitting: Neurology

## 2021-05-21 NOTE — Progress Notes (Signed)
Cardiology Office Note   Date:  05/22/2021   ID:  [name], DOB 1955-05-30, MRN [id number]  PCP:  [name], MD  Cardiologist:   Minus Breeding, MD Referring:  [name], MD  Chief Complaint  Patient presents with   Aortic Stenosis       History of Present Illness:   [name] is a 66 y.o. female who presents for evaluation of CAD and systolic HF.  She has had a history of CABG, bilateral lower extremity amputations chronic kidney disease.  She was admitted to [facility] .   She had acute on chronic heart failure.  Her discharge weight was 177 pounds.  She does have chronic renal insufficiency with her last creatinine being 1.74.  An echocardiogram in September at [facility] demonstrated an EF of 30 to 35%.  There was thinning and akinesis of the mid anterior, mid anteroseptal, basal anterior and basal anteroseptal segments.  There was moderately thickened aortic valve with severe aortic stenosis.  There was mild mitral regurgitation.  I was going to consider cardiac cath but her creat was increased when I saw her in the office for the first visit.  The creat was 2.83.  I reduced her Demadex.  Echo was ordered.  Preliminary reading still demonstrates that the ejection fraction is reduced with severe aortic stenosis.  She was admitted to [facility] in Dec with respiratory failure.  She was COVID positive.  She had elevated trop with demand ischemia.    EF was again 35 - 40%.  She was back in the hospital in Feb with syncope.  She had hypotension.  She also had anemia.  Colonoscopy did not demonstrate active bleed.  She had AKI.    She was seen by cardiology and the desire was to try to avoid a cath with her CKD and to consider an out patient TEE.  She is here to follow up.  She is actually done pretty well since she left the hospital.  There was 1 day of apparent bloody stools and she was told she had colitis in the hospital and was supposed to follow-up with GI but she never  heard of an appointment.  She said that she has not had any of the syncope that she had.  The story was that she actually fell and broke her pelvis first that they.  She was then driving to go shopping when she had some severe pain and syncope that was thought to be vagal.  She has not had any shortness of breath, PND or orthopnea.  She has not had any palpitations, presyncope or syncope.  She has had no weight gain or edema   Past Medical History:  Diagnosis Date   Acute congestive heart failure 03/29/2018   Acute encephalopathy 09/02/2013   Anemia, normocytic normochromic 03/14/2008   Arthritis    "hands"   Atherosclerosis of native arteries of the extremities with ulceration 07/14/2013   Atherosclerosis of native artery of extremity with intermittent claudication 09/16/2011   CAD (coronary artery disease) of artery bypass graft 03/14/2008   Chronic kidney disease (CKD) 12/26/2015   stage 3 due to type 2 diabetes mellitus   Chronic pain syndrome 44/03/270   Complication of anesthesia    pt felt like she had a hard time waking up after surgery in Feb. 2015.   Coronary artery disease involving native coronary artery without angina pectoris 09/25/2015   Essential hypertension 03/14/2008   Gastroesophageal reflux disease with  esophagitis 07/09/2016   Generalized anxiety disorder with panic attacks    History of one panic attack   GERD (gastroesophageal reflux disease)    takes Zantac   Hyperlipidemia, unspecified 03/14/2008   Localization-related symptomatic epilepsy and epileptic syndromes with complex partial seizures, not intractable, without status epilepticus 06/27/2014   Major depressive disorder 03/14/2008   Neuropathy    Osteomyelitis of ankle or foot, left, acute 06/17/2013   Peripheral vascular disease    PVD (peripheral vascular disease) 03/25/2012   Type 2 diabetes mellitus with diabetic mononeuropathy, with long-term current use of insulin 03/14/2008    Past Surgical  History:  Procedure Laterality Date   AMPUTATION Left 08/22/2013   Procedure: LEFT BELOW KNEE AMPUTATION;  Surgeon: Newt Minion, MD;  Location: [place];  Service: Orthopedics;  Laterality: Left;   ANKLE FUSION Left 05/11/2013   TIBIOCALCANEAL FUSION    ANKLE FUSION Left 05/12/2013   Procedure: LEFT TIBIOCALCANEAL FUSION;  Surgeon: Newt Minion, MD;  Location: [place];  Service: Orthopedics;  Laterality: Left;  Left Tibiocalcaneal Fusion   APPLICATION OF WOUND VAC  06/17/2013   Procedure: APPLICATION OF WOUND VAC;  Surgeon: Newt Minion, MD;  Location: [place];  Service: Orthopedics;;   BELOW KNEE LEG AMPUTATION Right Jan. 2008   CAROTID ENDARTERECTOMY Right    CATARACT EXTRACTION Left 12/25/14   CHOLECYSTECTOMY     COLONOSCOPY     COLONOSCOPY N/A 04/24/2021   Procedure: COLONOSCOPY;  Surgeon: Toledo, Benay Pike, MD;  Location: [facility];  Service: Gastroenterology;  Laterality: N/A;   CORONARY ARTERY BYPASS GRAFT  12/01/2007   "CABG X4" (05/12/2013)   DILATION AND CURETTAGE OF UTERUS     I & D EXTREMITY Left 06/17/2013   Procedure: IRRIGATION AND DEBRIDEMENT EXTREMITY, PLACEMENT ANTIBIOTIC STIMULAN BEADS;  Surgeon: Newt Minion, MD;  Location: [place];  Service: Orthopedics;  Laterality: Left;   INTRAMEDULLARY (IM) NAIL INTERTROCHANTERIC Left 08/20/2018   Procedure: INTRAMEDULLARY (IM) NAIL INTERTROCHANTRIC;  Surgeon: Shona Needles, MD;  Location: [place];  Service: Orthopedics;  Laterality: Left;   IRRIGATION AND DEBRIDEMENT ABSCESS Left 06/17/2013   ANKLE           DR [name]   TOE AMPUTATION Right    "took off a couple toes before the amputation"   TONSILLECTOMY       Current Outpatient Medications  Medication Sig Dispense Refill   acetaminophen-codeine (TYLENOL #3) 300-30 MG tablet Take 1 tablet by mouth every 6 (six) hours as needed.     albuterol (VENTOLIN HFA) 108 (90 Base) MCG/ACT inhaler Inhale 1-2 puffs into the lungs every 6 (six) hours as needed for wheezing or shortness of breath.       ALPRAZolam (XANAX) 0.5 MG tablet Take 0.5 mg by mouth 2 (two) times daily as needed for anxiety.     aspirin 81 MG tablet Take 1 tablet (81 mg total) by mouth daily. 90 tablet 0   carvedilol (COREG) 6.25 MG tablet Take 1 tablet (6.25 mg total) by mouth 2 (two) times daily. 180 tablet 3   clopidogrel (PLAVIX) 75 MG tablet Take 1 tablet (75 mg total) by mouth daily. 90 tablet 0   cycloSPORINE (RESTASIS) 0.05 % ophthalmic emulsion Place 1 drop into both eyes 2 (two) times daily as needed (dry eyes).     empagliflozin (JARDIANCE) 10 MG TABS tablet Take 10 mg by mouth daily.     ezetimibe (ZETIA) 10 MG tablet Take 10 mg by mouth daily.  insulin lispro (HUMALOG) 100 UNIT/ML injection Inject 12 Units into the skin 2 (two) times daily with a meal.     LANTUS SOLOSTAR 100 UNIT/ML Solostar Pen Inject 38 Units into the skin at bedtime.     Levetiracetam 750 MG TB24 Take 2 tablets (1,500 mg total) by mouth at bedtime. 60 tablet 8   lidocaine (LIDODERM) 5 % Place 1 patch onto the skin daily as needed (pain).     losartan (COZAAR) 50 MG tablet Take 50 mg by mouth daily.     Multiple Vitamins-Minerals (MULTIVITAMINS THER. W/MINERALS) TABS Take 1 tablet by mouth daily.       nitroGLYCERIN (NITROSTAT) 0.4 MG SL tablet Place 0.4 mg under the tongue every 5 (five) minutes as needed for chest pain.     potassium chloride SA (KLOR-CON) 20 MEQ tablet Take 20 mEq by mouth 2 (two) times daily.     pregabalin (LYRICA) 50 MG capsule Take 50 mg by mouth daily as needed (severe pain).     rosuvastatin (CRESTOR) 40 MG tablet Take 40 mg by mouth daily.     sertraline (ZOLOFT) 100 MG tablet Take 1 tablet (100 mg total) by mouth 2 (two) times daily. 180 tablet 0   torsemide (DEMADEX) 20 MG tablet Take 60 mg by mouth daily as needed (fluid/swelling).     No current facility-administered medications for this visit.    Allergies:   Metformin hcl, Ace inhibitors, Actos [pioglitazone], Atorvastatin, Candesartan cilexetil,  Cephalexin, Gabapentin, Influenza vaccines, Isosorbide nitrate, Quinapril hcl, Tramadol hcl, and Tape   ROS:  Please see the history of present illness.   Otherwise, review of systems are positive for none.   All other systems are reviewed and negative.    PHYSICAL EXAM: VS:  BP 139/75    Pulse 86    Ht '5\' 5"'$  (1.651 m)    Wt 190 lb (86.2 kg)    SpO2 99%    BMI 31.62 kg/m  , BMI Body mass index is 31.62 kg/m. GEN:  No distress NECK:  No jugular venous distention at 90 degrees, waveform within normal limits, carotid upstroke brisk and symmetric, no bruits, no thyromegaly LYMPHATICS:  No cervical adenopathy LUNGS:  Clear to auscultation bilaterally BACK:  No CVA tenderness CHEST:  Unremarkable HEART:  S1 and S2 within normal limits, no S3, no S4, no clicks, no rubs, 3 out of 6 apical systolic murmur radiating at the aortic outflow tract murmurs ABD:  Positive bowel sounds normal in frequency in pitch, no bruits, no rebound, no guarding, unable to assess midline mass or bruit with the patient seated. EXT:  2 plus pulses upper, bilateral lower extremity amputations, no cyanosis no clubbing SKIN:  No rashes no nodules NEURO:  Cranial nerves II through XII grossly intact, motor grossly intact throughout PSYCH:  Cognitively intact, oriented to person place and time   EKG:  EKG is not ordered today. NA  Recent Labs: 02/22/2021: Magnesium 2.2 04/21/2021: ALT 29; B Natriuretic Peptide 583.7 04/24/2021: BUN 30; Creatinine, Ser 1.69; Hemoglobin 9.4; Platelets 215; Potassium 3.9; Sodium 136    Lipid Panel    Component Value Date/Time   CHOL 113 08/30/2013 0550   TRIG 149 08/30/2013 0550   HDL 36 (L) 08/30/2013 0550   CHOLHDL 3.1 08/30/2013 0550   VLDL 30 08/30/2013 0550   LDLCALC 47 08/30/2013 0550      Wt Readings from Last 3 Encounters:  05/22/21 190 lb (86.2 kg)  04/24/21 190 lb 0.6 oz (86.2 kg)  02/21/21 181 lb 7 oz (82.3 kg)      Other studies Reviewed: Additional studies/  records that were reviewed today include: Echo   (Greater than 40 minutes reviewing all data with greater than 50% face to face with the patient). Review of the above records demonstrates:  Please see elsewhere in the note.     ASSESSMENT AND PLAN:  ACUTE ON CHRONIC SYSTOLIC HF: The patient seems to be euvolemic.  I clarified because her son is holding her medicines oftentimes her blood pressure is a little bit low in the 120s.  I want her to get the Cozaar daily.  I am going to check a basic metabolic profile and then we will check it again in 2 weeks since she will be taking the Cozaar daily.  We talked about the perhaps holding the Coreg if her systolic blood pressure is around 100 or so because he is nervous about her being hypotensive.  She uses as little Demadex as necessary and is actually taking about 20 mg every day.  We talked again about salt and fluid restriction.  Eventually she will need an ischemia work-up and if I am not planning a catheterization I probably would do a [employer].  I will be evaluating the valve however first.   AS: I am going to as planned in the hospital order a TEE to further evaluate the valve which look to be moderately severe.   HTN:   This is being managed in the context of treating his CHF   CAD/CABG:  The patient has no new sypmtoms.  No further cardiovascular testing is indicated.  We will continue with aggressive risk reduction and meds as listed.  MITRAL REGURGITATION:   This was mild to moderate and will also be assessed at the time of the TEE.  AKI:   I am following this up as above.  ACUTE ANEMIA: I will be checking a CBC.  Current medicines adoes not havere reviewed at length with the patient today.  The patient  concerns regarding medicines.  The following changes have been made: None  Labs/ tests ordered today include: Basic metabolic profile and CBC  No orders of the defined types were placed in this encounter.    Disposition:    FU with me after the TEE  Signed, Minus Breeding, MD  05/22/2021 8:18 AM    [place]

## 2021-05-22 ENCOUNTER — Other Ambulatory Visit: Payer: Self-pay | Admitting: *Deleted

## 2021-05-22 ENCOUNTER — Other Ambulatory Visit: Payer: Self-pay

## 2021-05-22 ENCOUNTER — Encounter: Payer: Self-pay | Admitting: Cardiology

## 2021-05-22 ENCOUNTER — Ambulatory Visit: Payer: Medicare Other | Admitting: Cardiology

## 2021-05-22 VITALS — BP 139/75 | HR 86 | Ht 65.0 in | Wt 190.0 lb

## 2021-05-22 DIAGNOSIS — N1832 Chronic kidney disease, stage 3b: Secondary | ICD-10-CM

## 2021-05-22 DIAGNOSIS — I350 Nonrheumatic aortic (valve) stenosis: Secondary | ICD-10-CM

## 2021-05-22 DIAGNOSIS — I5031 Acute diastolic (congestive) heart failure: Secondary | ICD-10-CM | POA: Diagnosis not present

## 2021-05-22 DIAGNOSIS — I25810 Atherosclerosis of coronary artery bypass graft(s) without angina pectoris: Secondary | ICD-10-CM

## 2021-05-22 LAB — CBC
Hematocrit: 28 % — ABNORMAL LOW (ref 34.0–46.6)
Hemoglobin: 9 g/dL — ABNORMAL LOW (ref 11.1–15.9)
MCH: 26.9 pg (ref 26.6–33.0)
MCHC: 32.1 g/dL (ref 31.5–35.7)
MCV: 84 fL (ref 79–97)
Platelets: 252 10*3/uL (ref 150–450)
RBC: 3.35 x10E6/uL — ABNORMAL LOW (ref 3.77–5.28)
RDW: 16.9 % — ABNORMAL HIGH (ref 11.7–15.4)
WBC: 10.1 10*3/uL (ref 3.4–10.8)

## 2021-05-22 LAB — BASIC METABOLIC PANEL
BUN/Creatinine Ratio: 18 (ref 12–28)
BUN: 24 mg/dL (ref 8–27)
CO2: 26 mmol/L (ref 20–29)
Calcium: 9.3 mg/dL (ref 8.7–10.3)
Chloride: 99 mmol/L (ref 96–106)
Creatinine, Ser: 1.3 mg/dL — ABNORMAL HIGH (ref 0.57–1.00)
Glucose: 197 mg/dL — ABNORMAL HIGH (ref 70–99)
Potassium: 4 mmol/L (ref 3.5–5.2)
Sodium: 140 mmol/L (ref 134–144)
eGFR: 46 mL/min/{1.73_m2} — ABNORMAL LOW (ref >59)

## 2021-05-22 NOTE — Patient Instructions (Signed)
Medication Instructions:  ? ?No changes ? ? ?Take Losartan everyday  ?If  yo have an issus with  pressure less than 100  may hold Coreg ? ?*If you need a refill on your cardiac medications before your next appointment, please call your pharmacy* ? ? ?Lab Work: ?CBC ?BMP ? ?BMP - 2 weeks  ?If you have labs (blood work) drawn today and your tests are completely normal, you will receive your results only by: ?MyChart Message (if you have MyChart) OR ?A paper copy in the mail ?If you have any lab test that is abnormal or we need to change your treatment, we will call you to review the results. ? ? ?Testing/Procedures: ?Will be schedule at [facility] - Endoscopy ?Your physician has requested that you have a TEE. During a TEE, sound waves are used to create images of your heart. It provides your doctor with information about the size and shape of your heart and how well your heart?s chambers and valves are working. In this test, a transducer is attached to the end of a flexible tube that?s guided down your throat and into your esophagus (the tube leading from you mouth to your stomach) to get a more detailed image of your heart. You are not awake for the procedure. Please see the instruction sheet given to you today. For further information please visit HugeFiesta.tn. ?  ? ?Follow-Up: ?At [facility], you and your health needs are our priority.  As part of our continuing mission to provide you with exceptional heart care, we have created designated Provider Care Teams.  These Care Teams include your primary Cardiologist (physician) and Advanced Practice Providers (APPs -  Physician Assistants and Nurse Practitioners) who all work together to provide you with the care you need, when you need it. ? ?  ? ?Your next appointment:   ?1 month(s) ? ?The format for your next appointment:   ?In Person ? ?Provider:   ?Minus Breeding, MD  ? ? ?Other Instructions  ? ?You are scheduled for a TEE on March 30,2023 with Dr.  Harriet Masson.  Please arrive at the [facility] (Main Entrance A) at [facility]: [address] [place], [place] [address] at 9  am.  ? ?DIET: Nothing to eat or drink after midnight except a sip of water with medications (see medication instructions below) ? ?FYI: For your safety, and to allow [place] to monitor your vital signs accurately during the surgery/procedure we request that   ?if you have artificial nails, gel coating, SNS etc. Please have those removed prior to your surgery/procedure. Not having the nail coverings /polish removed may result in cancellation or delay of your surgery/procedure. ? ? ?Medication Instructions: ? ? ?Continue your anticoagulant: Aspirin ?You will need to continue your anticoagulant after your procedure until you  are told by your  ?Provider that it is safe to stop ? ? ?Labs: CBC  , BMP today  ? ? ? ?You must have a responsible person to drive you home and stay in the waiting area during your procedure. Failure to do so could result in cancellation. ? ?Interior and spatial designer cards. ? ?*Special Note: Every effort is made to have your procedure done on time. Occasionally there are emergencies that occur at the hospital that may cause delays. Please be patient if a delay does occur.   ?

## 2021-05-22 NOTE — Addendum Note (Signed)
Addended by: Raiford Simmonds on: 05/22/2021 01:25 PM ? ? Modules accepted: Orders ? ?

## 2021-05-22 NOTE — Progress Notes (Signed)
Orders placed  per Dr Percival Spanish for TEE on March 30 , 2023 with Dr Harriet Masson ?

## 2021-05-23 ENCOUNTER — Telehealth: Payer: Self-pay | Admitting: Internal Medicine

## 2021-05-23 NOTE — Telephone Encounter (Signed)
We can not accommodate the request at this time. ?She should return to her GI doc that just did her colonoscopy. ?

## 2021-05-23 NOTE — Telephone Encounter (Addendum)
Hi Dr. Henrene Pastor, ? ? ?We received a referral from Dr. Minus Breeding for patient to be seen for Colitis. The patient had a colonoscopy perform in Feb 2023 at the hospital with Dr. Alice Reichert op\path reports are available in Epic for review. The referring provider states the patient does not wish to follow up with him no reason provided. ? ? ?Please advise on scheduling. ? ?Thanks ?

## 2021-06-05 ENCOUNTER — Encounter (HOSPITAL_COMMUNITY): Payer: Self-pay | Admitting: Cardiology

## 2021-06-05 DIAGNOSIS — I350 Nonrheumatic aortic (valve) stenosis: Secondary | ICD-10-CM | POA: Diagnosis not present

## 2021-06-05 DIAGNOSIS — I5031 Acute diastolic (congestive) heart failure: Secondary | ICD-10-CM | POA: Diagnosis not present

## 2021-06-05 DIAGNOSIS — I25810 Atherosclerosis of coronary artery bypass graft(s) without angina pectoris: Secondary | ICD-10-CM | POA: Diagnosis not present

## 2021-06-05 DIAGNOSIS — N1832 Chronic kidney disease, stage 3b: Secondary | ICD-10-CM | POA: Diagnosis not present

## 2021-06-05 NOTE — Progress Notes (Signed)
Attempted to obtain medical history via telephone, unable to reach at this time. I left a voicemail to return pre surgical testing department's phone call.  

## 2021-06-06 ENCOUNTER — Telehealth: Payer: Self-pay | Admitting: *Deleted

## 2021-06-06 DIAGNOSIS — K219 Gastro-esophageal reflux disease without esophagitis: Secondary | ICD-10-CM

## 2021-06-06 DIAGNOSIS — E43 Unspecified severe protein-calorie malnutrition: Secondary | ICD-10-CM

## 2021-06-06 LAB — BASIC METABOLIC PANEL
BUN/Creatinine Ratio: 17 (ref 12–28)
BUN: 27 mg/dL (ref 8–27)
CO2: 25 mmol/L (ref 20–29)
Calcium: 8.1 mg/dL — ABNORMAL LOW (ref 8.7–10.3)
Chloride: 103 mmol/L (ref 96–106)
Creatinine, Ser: 1.6 mg/dL — ABNORMAL HIGH (ref 0.57–1.00)
Glucose: 178 mg/dL — ABNORMAL HIGH (ref 70–99)
Potassium: 4.4 mmol/L (ref 3.5–5.2)
Sodium: 142 mmol/L (ref 134–144)
eGFR: 36 mL/min/{1.73_m2} — ABNORMAL LOW (ref >59)

## 2021-06-06 NOTE — Telephone Encounter (Signed)
Received a message - GI--[place] -  patient was denied for referral due to patient seeing Dr Alice Reichert while she was a  patient in [place]. ? RN discuss with Dr Percival Spanish . ? Per Dr Percival Spanish, send a referral to [facility] -- Dr Alice Reichert. ? Order placed ? ?

## 2021-06-10 ENCOUNTER — Encounter: Payer: Self-pay | Admitting: *Deleted

## 2021-06-13 ENCOUNTER — Telehealth: Payer: Self-pay

## 2021-06-13 ENCOUNTER — Ambulatory Visit (HOSPITAL_COMMUNITY): Payer: Medicare Other

## 2021-06-13 ENCOUNTER — Encounter (HOSPITAL_COMMUNITY): Admission: RE | Payer: Self-pay | Source: Home / Self Care

## 2021-06-13 ENCOUNTER — Ambulatory Visit (HOSPITAL_COMMUNITY): Admission: RE | Admit: 2021-06-13 | Payer: Medicare Other | Source: Home / Self Care | Admitting: Cardiology

## 2021-06-13 SURGERY — ECHOCARDIOGRAM, TRANSESOPHAGEAL
Anesthesia: Monitor Anesthesia Care

## 2021-06-13 NOTE — Telephone Encounter (Signed)
[id number] 

## 2021-06-13 NOTE — Telephone Encounter (Signed)
-----   Message from Minus Breeding, MD sent at 06/13/2021  1:12 PM EDT ----- ?Regarding: FW: TEE cancelled ?Not sure why she cancelled.   ? ?----- Message ----- ?From: Dorthy Cooler, RN ?Sent: 06/13/2021   9:59 AM EDT ?To: Minus Breeding, MD ?Subject: TEE cancelled                                 ? ?Per med tech on 3/27, pt told them TEE  cancelled. Patient did not show today and charge nurse left VM for patient. Canceling TEE.  ?Dawn ?Endo ? ? ?

## 2021-06-17 DIAGNOSIS — E1142 Type 2 diabetes mellitus with diabetic polyneuropathy: Secondary | ICD-10-CM | POA: Diagnosis not present

## 2021-06-20 NOTE — Telephone Encounter (Signed)
Left message for pt to call.

## 2021-06-21 NOTE — Telephone Encounter (Addendum)
Patient stated she cancelled TEE because, "I'm scared." She stated, "Nobody explained to me what it is." She wants Dr. Percival Spanish to call her and explain the TEE and its risks.

## 2021-06-21 NOTE — Telephone Encounter (Signed)
Patient will keep already scheduled 4/18 appointment with Dr. Percival Spanish. ?

## 2021-06-24 DIAGNOSIS — K921 Melena: Secondary | ICD-10-CM | POA: Diagnosis not present

## 2021-06-24 DIAGNOSIS — K5730 Diverticulosis of large intestine without perforation or abscess without bleeding: Secondary | ICD-10-CM | POA: Diagnosis not present

## 2021-06-24 DIAGNOSIS — Z86010 Personal history of colonic polyps: Secondary | ICD-10-CM | POA: Diagnosis not present

## 2021-06-24 DIAGNOSIS — K529 Noninfective gastroenteritis and colitis, unspecified: Secondary | ICD-10-CM | POA: Diagnosis not present

## 2021-06-25 DIAGNOSIS — Z09 Encounter for follow-up examination after completed treatment for conditions other than malignant neoplasm: Secondary | ICD-10-CM | POA: Diagnosis not present

## 2021-06-25 DIAGNOSIS — I209 Angina pectoris, unspecified: Secondary | ICD-10-CM | POA: Diagnosis not present

## 2021-06-25 DIAGNOSIS — G546 Phantom limb syndrome with pain: Secondary | ICD-10-CM | POA: Diagnosis not present

## 2021-06-25 DIAGNOSIS — R300 Dysuria: Secondary | ICD-10-CM | POA: Diagnosis not present

## 2021-06-25 DIAGNOSIS — Z794 Long term (current) use of insulin: Secondary | ICD-10-CM | POA: Diagnosis not present

## 2021-06-25 DIAGNOSIS — R531 Weakness: Secondary | ICD-10-CM | POA: Diagnosis not present

## 2021-06-25 DIAGNOSIS — R55 Syncope and collapse: Secondary | ICD-10-CM | POA: Diagnosis not present

## 2021-06-25 DIAGNOSIS — N184 Chronic kidney disease, stage 4 (severe): Secondary | ICD-10-CM | POA: Diagnosis not present

## 2021-06-25 DIAGNOSIS — Z89511 Acquired absence of right leg below knee: Secondary | ICD-10-CM | POA: Diagnosis not present

## 2021-06-25 DIAGNOSIS — S88112D Complete traumatic amputation at level between knee and ankle, left lower leg, subsequent encounter: Secondary | ICD-10-CM | POA: Diagnosis not present

## 2021-06-25 DIAGNOSIS — E1142 Type 2 diabetes mellitus with diabetic polyneuropathy: Secondary | ICD-10-CM | POA: Diagnosis not present

## 2021-06-30 NOTE — H&P (View-Only) (Signed)
?  ?Cardiology Office Note ? ? ?Date:  07/02/2021  ? ?ID:  [name], DOB February 11, 1956, MRN [id number] ? ?PCP:  [name], MD  ?Cardiologist:   Minus Breeding, MD ?Referring:  [name], MD ? ?Chief Complaint  ?Patient presents with  ? Aortic Stenosis  ? ? ? ?  ?History of Present Illness: ? ? ?[name] is a 66 y.o. female who presents for evaluation of CAD and systolic HF.  She has had a history of CABG, bilateral lower extremity amputations chronic kidney disease.  She was admitted to [facility] .   She had acute on chronic heart failure.  Her discharge weight was 177 pounds.  She does have chronic renal insufficiency with her last creatinine being 1.74.  An echocardiogram in September at [facility] demonstrated an EF of 30 to 35%.  There was thinning and akinesis of the mid anterior, mid anteroseptal, basal anterior and basal anteroseptal segments.  There was moderately thickened aortic valve with severe aortic stenosis.  There was mild mitral regurgitation.  I was going to consider cardiac cath but her creat was increased when I saw her in the office for the first visit.  The creat was 2.83.  I reduced her Demadex.  Echo was ordered.  Preliminary reading still demonstrates that the ejection fraction is reduced with severe aortic stenosis.  She was admitted to [facility] in Dec with respiratory failure.  She was COVID positive.  She had elevated trop with demand ischemia.    EF was again 35 - 40%.  She was back in the hospital in Feb with syncope.  She had hypotension.  She also had anemia.  Colonoscopy did not demonstrate active bleed.  She had AKI.    She was seen by cardiology and the desire was to try to avoid a cath with her CKD and to consider an out patient TEE.  We set this up after the last meeting but she cancelled it and called with several questions.   ? ?She presents now to discuss possible TEE.  She was worried because she thought that she was having surgery even though she had  presented with her son at the last visit we went through this thoroughly.  Today they came back and she is not having any new acute complaints.  She has not had any further syncope which was the reason for her last hospitalization.  She is not having any chest pressure, neck or arm discomfort.  She has no new shortness of breath, PND or orthopnea.  She has had no weight gain or edema. ? ? ?Past Medical History:  ?Diagnosis Date  ? Acute congestive heart failure 03/29/2018  ? Acute encephalopathy 09/02/2013  ? Anemia, normocytic normochromic 03/14/2008  ? Arthritis   ? "hands"  ? Atherosclerosis of native arteries of the extremities with ulceration 07/14/2013  ? Atherosclerosis of native artery of extremity with intermittent claudication 09/16/2011  ? CAD (coronary artery disease) of artery bypass graft 03/14/2008  ? Chronic kidney disease (CKD) 12/26/2015  ? stage 3 due to type 2 diabetes mellitus  ? Chronic pain syndrome 09/25/2015  ? Complication of anesthesia   ? pt felt like she had a hard time waking up after surgery in Feb. 2015.  ? Coronary artery disease involving native coronary artery without angina pectoris 09/25/2015  ? Essential hypertension 03/14/2008  ? Gastroesophageal reflux disease with esophagitis 07/09/2016  ? Generalized anxiety disorder with panic attacks   ? History of one  panic attack  ? GERD (gastroesophageal reflux disease)   ? takes Zantac  ? Hyperlipidemia, unspecified 03/14/2008  ? Localization-related symptomatic epilepsy and epileptic syndromes with complex partial seizures, not intractable, without status epilepticus 06/27/2014  ? Major depressive disorder 03/14/2008  ? Neuropathy   ? Osteomyelitis of ankle or foot, left, acute 06/17/2013  ? Peripheral vascular disease   ? PVD (peripheral vascular disease) 03/25/2012  ? Type 2 diabetes mellitus with diabetic mononeuropathy, with long-term current use of insulin 03/14/2008  ? ? ?Past Surgical History:  ?Procedure Laterality Date  ?  AMPUTATION Left 08/22/2013  ? Procedure: LEFT BELOW KNEE AMPUTATION;  Surgeon: Newt Minion, MD;  Location: [place];  Service: Orthopedics;  Laterality: Left;  ? ANKLE FUSION Left 05/11/2013  ? TIBIOCALCANEAL FUSION   ? ANKLE FUSION Left 05/12/2013  ? Procedure: LEFT TIBIOCALCANEAL FUSION;  Surgeon: Newt Minion, MD;  Location: [place];  Service: Orthopedics;  Laterality: Left;  Left Tibiocalcaneal Fusion  ? APPLICATION OF WOUND VAC  06/17/2013  ? Procedure: APPLICATION OF WOUND VAC;  Surgeon: Newt Minion, MD;  Location: [place];  Service: Orthopedics;;  ? BELOW KNEE LEG AMPUTATION Right Jan. 2008  ? CAROTID ENDARTERECTOMY Right   ? CATARACT EXTRACTION Left 12/25/14  ? CHOLECYSTECTOMY    ? COLONOSCOPY    ? COLONOSCOPY N/A 04/24/2021  ? Procedure: COLONOSCOPY;  Surgeon: Toledo, Benay Pike, MD;  Location: [facility];  Service: Gastroenterology;  Laterality: N/A;  ? CORONARY ARTERY BYPASS GRAFT  12/01/2007  ? "CABG X4" (05/12/2013)  ? DILATION AND CURETTAGE OF UTERUS    ? I & D EXTREMITY Left 06/17/2013  ? Procedure: IRRIGATION AND DEBRIDEMENT EXTREMITY, PLACEMENT ANTIBIOTIC STIMULAN BEADS;  Surgeon: Newt Minion, MD;  Location: [place];  Service: Orthopedics;  Laterality: Left;  ? INTRAMEDULLARY (IM) NAIL INTERTROCHANTERIC Left 08/20/2018  ? Procedure: INTRAMEDULLARY (IM) NAIL INTERTROCHANTRIC;  Surgeon: Shona Needles, MD;  Location: [place];  Service: Orthopedics;  Laterality: Left;  ? IRRIGATION AND DEBRIDEMENT ABSCESS Left 06/17/2013  ? ANKLE           DR [name]  ? TOE AMPUTATION Right   ? "took off a couple toes before the amputation"  ? TONSILLECTOMY    ? ? ? ?Current Outpatient Medications  ?Medication Sig Dispense Refill  ? acetaminophen-codeine (TYLENOL #3) 300-30 MG tablet Take 1 tablet by mouth every 6 (six) hours as needed.    ? albuterol (VENTOLIN HFA) 108 (90 Base) MCG/ACT inhaler Inhale 1-2 puffs into the lungs every 6 (six) hours as needed for wheezing or shortness of breath.     ? ALPRAZolam (XANAX) 0.5 MG tablet Take  0.5 mg by mouth 2 (two) times daily as needed for anxiety.    ? aspirin 81 MG tablet Take 1 tablet (81 mg total) by mouth daily. 90 tablet 0  ? carvedilol (COREG) 6.25 MG tablet Take 1 tablet (6.25 mg total) by mouth 2 (two) times daily. 180 tablet 3  ? clopidogrel (PLAVIX) 75 MG tablet Take 1 tablet (75 mg total) by mouth daily. 90 tablet 0  ? cycloSPORINE (RESTASIS) 0.05 % ophthalmic emulsion Place 1 drop into both eyes 2 (two) times daily as needed (dry eyes).    ? empagliflozin (JARDIANCE) 10 MG TABS tablet Take 10 mg by mouth daily.    ? ezetimibe (ZETIA) 10 MG tablet Take 10 mg by mouth daily.    ? insulin lispro (HUMALOG) 100 UNIT/ML injection Inject 12 Units into the skin 2 (two)  times daily with a meal.    ? LANTUS SOLOSTAR 100 UNIT/ML Solostar Pen Inject 38 Units into the skin at bedtime.    ? Levetiracetam 750 MG TB24 Take 2 tablets (1,500 mg total) by mouth at bedtime. 60 tablet 8  ? lidocaine (LIDODERM) 5 % Place 1 patch onto the skin daily as needed (pain).    ? losartan (COZAAR) 50 MG tablet Take 50 mg by mouth daily.    ? Multiple Vitamins-Minerals (MULTIVITAMINS THER. W/MINERALS) TABS Take 1 tablet by mouth daily.      ? nitroGLYCERIN (NITROSTAT) 0.4 MG SL tablet Place 0.4 mg under the tongue every 5 (five) minutes as needed for chest pain.    ? potassium chloride SA (KLOR-CON) 20 MEQ tablet Take 20 mEq by mouth 2 (two) times daily.    ? pregabalin (LYRICA) 50 MG capsule Take 50 mg by mouth daily as needed (severe pain).    ? rosuvastatin (CRESTOR) 40 MG tablet Take 40 mg by mouth daily.    ? sertraline (ZOLOFT) 100 MG tablet Take 1 tablet (100 mg total) by mouth 2 (two) times daily. 180 tablet 0  ? torsemide (DEMADEX) 20 MG tablet Take 60 mg by mouth daily as needed (fluid/swelling).    ? ?No current facility-administered medications for this visit.  ? ? ?Allergies:   Metformin hcl, Ace inhibitors, Actos [pioglitazone], Atorvastatin, Candesartan cilexetil, Cephalexin, Gabapentin, Influenza  vaccines, Isosorbide nitrate, Quinapril hcl, Tramadol hcl, and Tape  ? ?ROS:  Please see the history of present illness.   Otherwise, review of systems are positive for none .   All other systems are reviewed and negati

## 2021-06-30 NOTE — Progress Notes (Signed)
?  ?Cardiology Office Note ? ? ?Date:  07/02/2021  ? ?ID:  [name], DOB [birth year], MRN [id number] ? ?PCP:  [name], MD  ?Cardiologist:   Minus Breeding, MD ?Referring:  [name], MD ? ?Chief Complaint  ?Patient presents with  ? Aortic Stenosis  ? ? ? ?  ?History of Present Illness: ? ? ?[name] is a 66 y.o. female who presents for evaluation of CAD and systolic HF.  She has had a history of CABG, bilateral lower extremity amputations chronic kidney disease.  She was admitted to [facility] .   She had acute on chronic heart failure.  Her discharge weight was 177 pounds.  She does have chronic renal insufficiency with her last creatinine being 1.74.  An echocardiogram in September at [facility] demonstrated an EF of 30 to 35%.  There was thinning and akinesis of the mid anterior, mid anteroseptal, basal anterior and basal anteroseptal segments.  There was moderately thickened aortic valve with severe aortic stenosis.  There was mild mitral regurgitation.  I was going to consider cardiac cath but her creat was increased when I saw her in the office for the first visit.  The creat was 2.83.  I reduced her Demadex.  Echo was ordered.  Preliminary reading still demonstrates that the ejection fraction is reduced with severe aortic stenosis.  She was admitted to [facility] in Dec with respiratory failure.  She was COVID positive.  She had elevated trop with demand ischemia.    EF was again 35 - 40%.  She was back in the hospital in Feb with syncope.  She had hypotension.  She also had anemia.  Colonoscopy did not demonstrate active bleed.  She had AKI.    She was seen by cardiology and the desire was to try to avoid a cath with her CKD and to consider an out patient TEE.  We set this up after the last meeting but she cancelled it and called with several questions.   ? ?She presents now to discuss possible TEE.  She was worried because she thought that she was having surgery even though she had  presented with her son at the last visit we went through this thoroughly.  Today they came back and she is not having any new acute complaints.  She has not had any further syncope which was the reason for her last hospitalization.  She is not having any chest pressure, neck or arm discomfort.  She has no new shortness of breath, PND or orthopnea.  She has had no weight gain or edema. ? ? ?Past Medical History:  ?Diagnosis Date  ? Acute congestive heart failure 03/29/2018  ? Acute encephalopathy 09/02/2013  ? Anemia, normocytic normochromic 03/14/2008  ? Arthritis   ? "hands"  ? Atherosclerosis of native arteries of the extremities with ulceration 07/14/2013  ? Atherosclerosis of native artery of extremity with intermittent claudication 09/16/2011  ? CAD (coronary artery disease) of artery bypass graft 03/14/2008  ? Chronic kidney disease (CKD) 12/26/2015  ? stage 3 due to type 2 diabetes mellitus  ? Chronic pain syndrome 09/25/2015  ? Complication of anesthesia   ? pt felt like she had a hard time waking up after surgery in Feb. 2015.  ? Coronary artery disease involving native coronary artery without angina pectoris 09/25/2015  ? Essential hypertension 03/14/2008  ? Gastroesophageal reflux disease with esophagitis 07/09/2016  ? Generalized anxiety disorder with panic attacks   ? History of one  panic attack  ? GERD (gastroesophageal reflux disease)   ? takes Zantac  ? Hyperlipidemia, unspecified 03/14/2008  ? Localization-related symptomatic epilepsy and epileptic syndromes with complex partial seizures, not intractable, without status epilepticus 06/27/2014  ? Major depressive disorder 03/14/2008  ? Neuropathy   ? Osteomyelitis of ankle or foot, left, acute 06/17/2013  ? Peripheral vascular disease   ? PVD (peripheral vascular disease) 03/25/2012  ? Type 2 diabetes mellitus with diabetic mononeuropathy, with long-term current use of insulin 03/14/2008  ? ? ?Past Surgical History:  ?Procedure Laterality Date  ?  AMPUTATION Left 08/22/2013  ? Procedure: LEFT BELOW KNEE AMPUTATION;  Surgeon: Newt Minion, MD;  Location: [place];  Service: Orthopedics;  Laterality: Left;  ? ANKLE FUSION Left 05/11/2013  ? TIBIOCALCANEAL FUSION   ? ANKLE FUSION Left 05/12/2013  ? Procedure: LEFT TIBIOCALCANEAL FUSION;  Surgeon: Newt Minion, MD;  Location: [place];  Service: Orthopedics;  Laterality: Left;  Left Tibiocalcaneal Fusion  ? APPLICATION OF WOUND VAC  06/17/2013  ? Procedure: APPLICATION OF WOUND VAC;  Surgeon: Newt Minion, MD;  [place];  Service: Orthopedics;;  ? BELOW KNEE LEG AMPUTATION Right Jan. 2008  ? CAROTID ENDARTERECTOMY Right   ? CATARACT EXTRACTION Left 12/25/14  ? CHOLECYSTECTOMY    ? COLONOSCOPY    ? COLONOSCOPY N/A 04/24/2021  ? Procedure: COLONOSCOPY;  Surgeon: Toledo, Benay Pike, MD;  Location: [facility];  Service: Gastroenterology;  Laterality: N/A;  ? CORONARY ARTERY BYPASS GRAFT  12/01/2007  ? "CABG X4" (05/12/2013)  ? DILATION AND CURETTAGE OF UTERUS    ? I & D EXTREMITY Left 06/17/2013  ? Procedure: IRRIGATION AND DEBRIDEMENT EXTREMITY, PLACEMENT ANTIBIOTIC STIMULAN BEADS;  Surgeon: Newt Minion, MD;  Location: [place];  Service: Orthopedics;  Laterality: Left;  ? INTRAMEDULLARY (IM) NAIL INTERTROCHANTERIC Left 08/20/2018  ? Procedure: INTRAMEDULLARY (IM) NAIL INTERTROCHANTRIC;  Surgeon: Shona Needles, MD;  Location: [place];  Service: Orthopedics;  Laterality: Left;  ? IRRIGATION AND DEBRIDEMENT ABSCESS Left 06/17/2013  ? ANKLE           DR [name]  ? TOE AMPUTATION Right   ? "took off a couple toes before the amputation"  ? TONSILLECTOMY    ? ? ? ?Current Outpatient Medications  ?Medication Sig Dispense Refill  ? acetaminophen-codeine (TYLENOL #3) 300-30 MG tablet Take 1 tablet by mouth every 6 (six) hours as needed.    ? albuterol (VENTOLIN HFA) 108 (90 Base) MCG/ACT inhaler Inhale 1-2 puffs into the lungs every 6 (six) hours as needed for wheezing or shortness of breath.     ? ALPRAZolam (XANAX) 0.5 MG tablet Take  0.5 mg by mouth 2 (two) times daily as needed for anxiety.    ? aspirin 81 MG tablet Take 1 tablet (81 mg total) by mouth daily. 90 tablet 0  ? carvedilol (COREG) 6.25 MG tablet Take 1 tablet (6.25 mg total) by mouth 2 (two) times daily. 180 tablet 3  ? clopidogrel (PLAVIX) 75 MG tablet Take 1 tablet (75 mg total) by mouth daily. 90 tablet 0  ? cycloSPORINE (RESTASIS) 0.05 % ophthalmic emulsion Place 1 drop into both eyes 2 (two) times daily as needed (dry eyes).    ? empagliflozin (JARDIANCE) 10 MG TABS tablet Take 10 mg by mouth daily.    ? ezetimibe (ZETIA) 10 MG tablet Take 10 mg by mouth daily.    ? insulin lispro (HUMALOG) 100 UNIT/ML injection Inject 12 Units into the skin 2 (two)  times daily with a meal.    ? LANTUS SOLOSTAR 100 UNIT/ML Solostar Pen Inject 38 Units into the skin at bedtime.    ? Levetiracetam 750 MG TB24 Take 2 tablets (1,500 mg total) by mouth at bedtime. 60 tablet 8  ? lidocaine (LIDODERM) 5 % Place 1 patch onto the skin daily as needed (pain).    ? losartan (COZAAR) 50 MG tablet Take 50 mg by mouth daily.    ? Multiple Vitamins-Minerals (MULTIVITAMINS THER. W/MINERALS) TABS Take 1 tablet by mouth daily.      ? nitroGLYCERIN (NITROSTAT) 0.4 MG SL tablet Place 0.4 mg under the tongue every 5 (five) minutes as needed for chest pain.    ? potassium chloride SA (KLOR-CON) 20 MEQ tablet Take 20 mEq by mouth 2 (two) times daily.    ? pregabalin (LYRICA) 50 MG capsule Take 50 mg by mouth daily as needed (severe pain).    ? rosuvastatin (CRESTOR) 40 MG tablet Take 40 mg by mouth daily.    ? sertraline (ZOLOFT) 100 MG tablet Take 1 tablet (100 mg total) by mouth 2 (two) times daily. 180 tablet 0  ? torsemide (DEMADEX) 20 MG tablet Take 60 mg by mouth daily as needed (fluid/swelling).    ? ?No current facility-administered medications for this visit.  ? ? ?Allergies:   Metformin hcl, Ace inhibitors, Actos [pioglitazone], Atorvastatin, Candesartan cilexetil, Cephalexin, Gabapentin, Influenza  vaccines, Isosorbide nitrate, Quinapril hcl, Tramadol hcl, and Tape  ? ?ROS:  Please see the history of present illness.   Otherwise, review of systems are positive for none .   All other systems are reviewed and negati

## 2021-07-02 ENCOUNTER — Encounter: Payer: Self-pay | Admitting: Cardiology

## 2021-07-02 ENCOUNTER — Ambulatory Visit: Payer: Medicare Other | Admitting: Cardiology

## 2021-07-02 VITALS — BP 118/90 | HR 95 | Ht 65.0 in | Wt 176.0 lb

## 2021-07-02 DIAGNOSIS — I10 Essential (primary) hypertension: Secondary | ICD-10-CM | POA: Diagnosis not present

## 2021-07-02 DIAGNOSIS — Q233 Congenital mitral insufficiency: Secondary | ICD-10-CM

## 2021-07-02 DIAGNOSIS — I5023 Acute on chronic systolic (congestive) heart failure: Secondary | ICD-10-CM

## 2021-07-02 DIAGNOSIS — I25810 Atherosclerosis of coronary artery bypass graft(s) without angina pectoris: Secondary | ICD-10-CM | POA: Diagnosis not present

## 2021-07-02 DIAGNOSIS — I350 Nonrheumatic aortic (valve) stenosis: Secondary | ICD-10-CM | POA: Diagnosis not present

## 2021-07-02 DIAGNOSIS — N179 Acute kidney failure, unspecified: Secondary | ICD-10-CM | POA: Diagnosis not present

## 2021-07-02 DIAGNOSIS — D649 Anemia, unspecified: Secondary | ICD-10-CM

## 2021-07-02 NOTE — Telephone Encounter (Signed)
Patient to see Dr. Percival Spanish today. ?

## 2021-07-02 NOTE — Patient Instructions (Addendum)
Medication Instructions:  ?No changes ?*If you need a refill on your cardiac medications before your next appointment, please call your pharmacy* ? ?Testing/Procedures: ?Your physician has requested that you have a TEE. During a TEE, sound waves are used to create images of your heart. It provides your doctor with information about the size and shape of your heart and how well your heart?s chambers and valves are working. In this test, a transducer is attached to the end of a flexible tube that?s guided down your throat and into your esophagus (the tube leading from you mouth to your stomach) to get a more detailed image of your heart. You are not awake for the procedure. Please see the instruction sheet given to you today. For further information please visit [email or web address]. ? ?Follow-Up: ?At [facility], you and your health needs are our priority.  As part of our continuing mission to provide you with exceptional heart care, we have created designated Provider Care Teams.  These Care Teams include your primary Cardiologist (physician) and Advanced Practice Providers (APPs -  Physician Assistants and Nurse Practitioners) who all work together to provide you with the care you need, when you need it. ? ?We recommend signing up for the patient portal called "MyChart".  Sign up information is provided on this After Visit Summary.  MyChart is used to connect with patients for Virtual Visits (Telemedicine).  Patients are able to view lab/test results, encounter notes, upcoming appointments, etc.  Non-urgent messages can be sent to your provider as well.   ?To learn more about what you can do with MyChart, go to [email or web address].   ? ?Your next appointment:   ?3 month(s) ? ?The format for your next appointment:   ?In Person ? ?Provider:   ?Minus Breeding, MD { ? ? ?Other Instructions ? ?You are scheduled for a TEE on 07/31/21 with Dr. Audie Box.  Please arrive at the [facility] [employer] (Main Entrance A) at [facility]: [place], [place] [address] at 8 am. (1 hour prior to procedure) ? ?DIET: Nothing to eat or drink after midnight except a sip of water with medications (see medication instructions below) ? ?FYI: For your safety, and to allow [place] to monitor your vital signs accurately during the surgery/procedure we request that   ?if you have artificial nails, gel coating, SNS etc. Please have those removed prior to your surgery/procedure. Not having the nail coverings /polish removed may result in cancellation or delay of your surgery/procedure. ? ? ?Medication Instructions: ?Hold all diabetic medications the morning of the procedure ? ? ?Labs: ?Your provider would like for you to return on 5/10 or 5/11 to have the following labs drawn: CBC and BMET. You do not need an appointment for the lab. Once in our office lobby there is a podium where you can sign in and ring the doorbell to alert [place] that you are here. The lab is open from 8:00 am to 4 pm; closed for lunch from 12:45pm-1:45pm. ? ?You may also go to any of these [facility] locations: ?  ?[place] ?- [place] ([facility]) ?- [address] ?- [place]  ? ?You must have a responsible person to drive you home and stay in the waiting area during your procedure. Failure to do so could result in cancellation. ? ?Interior and spatial designer cards. ? ?*Special Note: Every effort is made to have your procedure done on time. Occasionally  there are emergencies that occur at the hospital that may cause delays. Please be patient if a delay does occur.  ? ? ? ? ?

## 2021-07-04 ENCOUNTER — Ambulatory Visit: Payer: Medicare Other | Admitting: Cardiology

## 2021-07-24 ENCOUNTER — Encounter (HOSPITAL_COMMUNITY): Payer: Self-pay | Admitting: Cardiovascular Disease

## 2021-07-24 NOTE — Progress Notes (Signed)
Attempted to obtain medical history via telephone, unable to reach at this time. I left a voicemail to return pre surgical testing department's phone call.  

## 2021-07-25 DIAGNOSIS — I350 Nonrheumatic aortic (valve) stenosis: Secondary | ICD-10-CM | POA: Diagnosis not present

## 2021-07-25 DIAGNOSIS — I5023 Acute on chronic systolic (congestive) heart failure: Secondary | ICD-10-CM | POA: Diagnosis not present

## 2021-07-26 LAB — BASIC METABOLIC PANEL
BUN/Creatinine Ratio: 22 (ref 12–28)
BUN: 34 mg/dL — ABNORMAL HIGH (ref 8–27)
CO2: 23 mmol/L (ref 20–29)
Calcium: 9.2 mg/dL (ref 8.7–10.3)
Chloride: 97 mmol/L (ref 96–106)
Creatinine, Ser: 1.57 mg/dL — ABNORMAL HIGH (ref 0.57–1.00)
Glucose: 248 mg/dL — ABNORMAL HIGH (ref 70–99)
Potassium: 3.7 mmol/L (ref 3.5–5.2)
Sodium: 137 mmol/L (ref 134–144)
eGFR: 37 mL/min/{1.73_m2} — ABNORMAL LOW (ref >59)

## 2021-07-26 LAB — CBC
Hematocrit: 32.1 % — ABNORMAL LOW (ref 34.0–46.6)
Hemoglobin: 10.2 g/dL — ABNORMAL LOW (ref 11.1–15.9)
MCH: 26.1 pg — ABNORMAL LOW (ref 26.6–33.0)
MCHC: 31.8 g/dL (ref 31.5–35.7)
MCV: 82 fL (ref 79–97)
Platelets: 202 10*3/uL (ref 150–450)
RBC: 3.91 x10E6/uL (ref 3.77–5.28)
RDW: 17 % — ABNORMAL HIGH (ref 11.7–15.4)
WBC: 5.2 10*3/uL (ref 3.4–10.8)

## 2021-07-30 ENCOUNTER — Other Ambulatory Visit: Payer: Self-pay | Admitting: *Deleted

## 2021-07-30 DIAGNOSIS — I350 Nonrheumatic aortic (valve) stenosis: Secondary | ICD-10-CM

## 2021-07-31 ENCOUNTER — Encounter (HOSPITAL_COMMUNITY): Payer: Self-pay | Admitting: Cardiovascular Disease

## 2021-07-31 ENCOUNTER — Ambulatory Visit (HOSPITAL_COMMUNITY): Payer: Medicare Other | Admitting: Certified Registered"

## 2021-07-31 ENCOUNTER — Ambulatory Visit (HOSPITAL_BASED_OUTPATIENT_CLINIC_OR_DEPARTMENT_OTHER): Payer: Medicare Other | Admitting: Certified Registered"

## 2021-07-31 ENCOUNTER — Encounter: Payer: Self-pay | Admitting: *Deleted

## 2021-07-31 ENCOUNTER — Ambulatory Visit (HOSPITAL_COMMUNITY)
Admission: RE | Admit: 2021-07-31 | Discharge: 2021-07-31 | Disposition: A | Discharge status: Home / Self Care | Payer: Medicare Other | Attending: Cardiovascular Disease | Admitting: Cardiovascular Disease

## 2021-07-31 ENCOUNTER — Ambulatory Visit (HOSPITAL_BASED_OUTPATIENT_CLINIC_OR_DEPARTMENT_OTHER)
Admission: RE | Admit: 2021-07-31 | Discharge: 2021-07-31 | Disposition: A | Discharge status: Home / Self Care | Payer: Medicare Other | Source: Ambulatory Visit | Attending: Cardiovascular Disease | Admitting: Cardiovascular Disease

## 2021-07-31 ENCOUNTER — Encounter (HOSPITAL_COMMUNITY)
Admission: RE | Disposition: A | Discharge status: Home / Self Care | Payer: Self-pay | Source: Home / Self Care | Attending: Cardiovascular Disease

## 2021-07-31 ENCOUNTER — Other Ambulatory Visit: Payer: Self-pay | Admitting: Neurology

## 2021-07-31 DIAGNOSIS — I350 Nonrheumatic aortic (valve) stenosis: Secondary | ICD-10-CM

## 2021-07-31 DIAGNOSIS — I130 Hypertensive heart and chronic kidney disease with heart failure and stage 1 through stage 4 chronic kidney disease, or unspecified chronic kidney disease: Secondary | ICD-10-CM

## 2021-07-31 DIAGNOSIS — Z794 Long term (current) use of insulin: Secondary | ICD-10-CM | POA: Insufficient documentation

## 2021-07-31 DIAGNOSIS — D509 Iron deficiency anemia, unspecified: Secondary | ICD-10-CM | POA: Insufficient documentation

## 2021-07-31 DIAGNOSIS — I2510 Atherosclerotic heart disease of native coronary artery without angina pectoris: Secondary | ICD-10-CM | POA: Diagnosis not present

## 2021-07-31 DIAGNOSIS — E1122 Type 2 diabetes mellitus with diabetic chronic kidney disease: Secondary | ICD-10-CM

## 2021-07-31 DIAGNOSIS — N189 Chronic kidney disease, unspecified: Secondary | ICD-10-CM | POA: Diagnosis not present

## 2021-07-31 DIAGNOSIS — I080 Rheumatic disorders of both mitral and aortic valves: Secondary | ICD-10-CM | POA: Insufficient documentation

## 2021-07-31 DIAGNOSIS — N183 Chronic kidney disease, stage 3 unspecified: Secondary | ICD-10-CM | POA: Insufficient documentation

## 2021-07-31 DIAGNOSIS — I5023 Acute on chronic systolic (congestive) heart failure: Secondary | ICD-10-CM

## 2021-07-31 DIAGNOSIS — I083 Combined rheumatic disorders of mitral, aortic and tricuspid valves: Secondary | ICD-10-CM | POA: Diagnosis not present

## 2021-07-31 DIAGNOSIS — Z951 Presence of aortocoronary bypass graft: Secondary | ICD-10-CM | POA: Diagnosis not present

## 2021-07-31 DIAGNOSIS — G546 Phantom limb syndrome with pain: Secondary | ICD-10-CM

## 2021-07-31 DIAGNOSIS — I340 Nonrheumatic mitral (valve) insufficiency: Secondary | ICD-10-CM | POA: Diagnosis not present

## 2021-07-31 DIAGNOSIS — Z7984 Long term (current) use of oral hypoglycemic drugs: Secondary | ICD-10-CM | POA: Diagnosis not present

## 2021-07-31 DIAGNOSIS — I509 Heart failure, unspecified: Secondary | ICD-10-CM | POA: Diagnosis not present

## 2021-07-31 DIAGNOSIS — I700 Atherosclerosis of aorta: Secondary | ICD-10-CM | POA: Insufficient documentation

## 2021-07-31 HISTORY — PX: BUBBLE STUDY: SHX6837

## 2021-07-31 HISTORY — PX: TEE WITHOUT CARDIOVERSION: SHX5443

## 2021-07-31 LAB — ECHO TEE
AR max vel: 0.84 cm2
AV Area VTI: 0.73 cm2
AV Area mean vel: 0.83 cm2
AV Mean grad: 28.3 mmHg
AV Peak grad: 49.7 mmHg
Ao pk vel: 3.52 m/s
MV M vel: 5.41 m/s
MV Peak grad: 117.1 mmHg
Radius: 0.7 cm

## 2021-07-31 SURGERY — ECHOCARDIOGRAM, TRANSESOPHAGEAL
Anesthesia: Monitor Anesthesia Care

## 2021-07-31 MED ORDER — LACTATED RINGERS IV SOLN
INTRAVENOUS | Status: AC | PRN
  Administered 2021-07-31: 1000 mL via INTRAVENOUS

## 2021-07-31 MED ORDER — PROPOFOL 500 MG/50ML IV EMUL
INTRAVENOUS | Status: DC | PRN
  Administered 2021-07-31: 125 ug/kg/min via INTRAVENOUS

## 2021-07-31 MED ORDER — LIDOCAINE 2% (20 MG/ML) 5 ML SYRINGE
INTRAMUSCULAR | Status: DC | PRN
  Administered 2021-07-31: 80 mg via INTRAVENOUS

## 2021-07-31 MED ORDER — PHENYLEPHRINE HCL (PRESSORS) 10 MG/ML IV SOLN
INTRAVENOUS | Status: DC | PRN
  Administered 2021-07-31 (×4): 80 ug via INTRAVENOUS

## 2021-07-31 MED ORDER — SODIUM CHLORIDE 0.9 % IV SOLN: INTRAVENOUS | Status: DC

## 2021-07-31 MED ORDER — PROPOFOL 10 MG/ML IV BOLUS
INTRAVENOUS | Status: DC | PRN
  Administered 2021-07-31: 30 mg via INTRAVENOUS
  Administered 2021-07-31: 20 mg via INTRAVENOUS

## 2021-07-31 NOTE — Anesthesia Postprocedure Evaluation (Signed)
Anesthesia Post Note ? ?Patient: [name] ? ?Procedure(s) Performed: TRANSESOPHAGEAL ECHOCARDIOGRAM (TEE) ?BUBBLE STUDY ? ?  ? ?Patient location during evaluation: PACU ?Anesthesia Type: MAC ?Level of consciousness: awake and alert ?Pain management: pain level controlled ?Vital Signs Assessment: post-procedure vital signs reviewed and stable ?Respiratory status: spontaneous breathing, nonlabored ventilation, respiratory function stable and patient connected to nasal cannula oxygen ?Cardiovascular status: stable and blood pressure returned to baseline ?Postop Assessment: no apparent nausea or vomiting ?Anesthetic complications: no ? ? ?No notable events documented. ? ?Last Vitals:  ?Vitals:  ? 07/31/21 0951 07/31/21 1000  ?BP: 111/87 (!) 126/51  ?Pulse: 87 86  ?Resp: 13 12  ?Temp:    ?SpO2: 95% 95%  ?  ?Last Pain:  ?Vitals:  ? 07/31/21 0941  ?TempSrc: Temporal  ?PainSc: 0-No pain  ? ? ?  ?  ?  ?  ?  ?  ? ?Effie Berkshire ? ? ? ? ?

## 2021-07-31 NOTE — Interval H&P Note (Signed)
History and Physical Interval Note: ? ?07/31/2021 ?8:22 AM ? ?[name]  has presented today for surgery, with the diagnosis of AORTIC STENOSIS.  The various methods of treatment have been discussed with the patient and family. After consideration of risks, benefits and other options for treatment, the patient has consented to  Procedure(s): ?TRANSESOPHAGEAL ECHOCARDIOGRAM (TEE) (N/A) as a surgical intervention.  The patient's history has been reviewed, patient examined, no change in status, stable for surgery.  I have reviewed the patient's chart and labs.  Questions were answered to the patient's satisfaction.   ? ?NPO for TEE for aortic stenosis. No difficulties swallowing.  ? ?[place] T. Audie Box, MD, [facility] ?[place]  [address] ?[place], [place] [address] ?[phone]  ?8:23 AM ? ? ? ?

## 2021-07-31 NOTE — Discharge Instructions (Signed)

## 2021-07-31 NOTE — Anesthesia Procedure Notes (Signed)
Procedure Name: [place] ?Date/Time: 07/31/2021 9:03 AM ?Performed by: Amadeo Garnet, CRNA ?Pre-anesthesia Checklist: Patient identified, Emergency Drugs available, Suction available and Patient being monitored ?Patient Re-evaluated:Patient Re-evaluated prior to induction ?Oxygen Delivery Method: Nasal cannula ?Preoxygenation: Pre-oxygenation with 100% oxygen ?Induction Type: IV induction ?Placement Confirmation: positive ETCO2 ?Dental Injury: Teeth and Oropharynx as per pre-operative assessment  ? ? ? ? ?

## 2021-07-31 NOTE — Transfer of Care (Signed)
Immediate Anesthesia Transfer of Care Note ? ?Patient: [name] ? ?Procedure(s) Performed: TRANSESOPHAGEAL ECHOCARDIOGRAM (TEE) ?BUBBLE STUDY ? ?Patient Location: Endoscopy Unit ? ?Anesthesia Type:MAC ? ?Level of Consciousness: awake, alert  and oriented ? ?Airway & Oxygen Therapy: Patient Spontanous Breathing ? ?Post-op Assessment: Report given to RN, Post -op Vital signs reviewed and stable and Patient moving all extremities ? ?Post vital signs: Reviewed and stable ? ?Last Vitals:  ?Vitals Value Taken Time  ?BP    ?Temp    ?Pulse 83 07/31/21 0941  ?Resp 14 07/31/21 0941  ?SpO2 93 % 07/31/21 0941  ?Vitals shown include unvalidated device data. ? ?Last Pain:  ?Vitals:  ? 07/31/21 0830  ?TempSrc: Temporal  ?PainSc: 0-No pain  ?   ? ?  ? ?Complications: No notable events documented. ?

## 2021-07-31 NOTE — CV Procedure (Signed)
? ? ?  TRANSESOPHAGEAL ECHOCARDIOGRAM  ? ?NAME:  [name]    ?MRN: [id number] ?DOB:  March 29, 1955    ?ADMIT DATE: 07/31/2021 ? ?INDICATIONS: ?Aortic stenosis ? ?PROCEDURE:  ? ?Informed consent was obtained prior to the procedure. The risks, benefits and alternatives for the procedure were discussed and the patient comprehended these risks.  Risks include, but are not limited to, cough, sore throat, vomiting, nausea, somnolence, esophageal and stomach trauma or perforation, bleeding, low blood pressure, aspiration, pneumonia, infection, trauma to the teeth and death.   ? ?Procedural time out performed. The oropharynx was anesthetized with topical 1% benzocaine.   ? ?Anesthesia was administered by Dr. Smith Robert.  The patient was administered 300 mg of propofol and 80 mg of lidocaine to achieve and maintain moderate conscious sedation.  The patient's heart rate, blood pressure, and oxygen saturation are monitored continuously during the procedure. The period of conscious sedation is 26 minutes, of which I was present face-to-face 100% of this time.  ? ?The transesophageal probe was inserted in the esophagus and stomach without difficulty and multiple views were obtained.  ? ?COMPLICATIONS:   ? ?There were no immediate complications. ? ?KEY FINDINGS: ? ?Moderate to severe AS.  ?Moderate MR.  ?Normal LV/RV function.  ?Full report to follow. ?Further management per primary team.  ? ?[place] Bells T. Audie Box, MD, [facility] ?[address]  ?[address], [address] ?[place], [place] [address] ?[phone]  ?9:38 AM ? ? ?

## 2021-07-31 NOTE — Progress Notes (Signed)
?  Echocardiogram ?Echocardiogram Transesophageal has been performed. ? ?[name] ?07/31/2021, 1:39 PM ?

## 2021-07-31 NOTE — Anesthesia Preprocedure Evaluation (Signed)
Anesthesia Evaluation  ?Patient identified by MRN, date of birth, ID band ?Patient awake ? ? ? ?Reviewed: ?Allergy & Precautions, NPO status , Patient's Chart, lab work & pertinent test results ? ?Airway ?Mallampati: III ? ?TM Distance: >3 FB ?Neck ROM: Full ? ? ? Dental ? ?(+) Teeth Intact, Dental Advisory Given, Poor Dentition ?  ?Pulmonary ? ?  ?breath sounds clear to auscultation ? ? ? ? ? ? Cardiovascular ?hypertension, + CAD, + CABG, + Peripheral Vascular Disease, +CHF and + DOE  ?+ Valvular Problems/Murmurs AS  ?Rhythm:Regular Rate:Normal ?+ Systolic murmurs ? ?  ?Neuro/Psych ?Seizures -, Well Controlled,  PSYCHIATRIC DISORDERS Anxiety Depression  Neuromuscular disease   ? GI/Hepatic ?Neg liver ROS, GERD  ,  ?Endo/Other  ?diabetes ? Renal/GU ?CRFRenal disease  ? ?  ?Musculoskeletal ? ?(+) Arthritis ,  ? Abdominal ?Normal abdominal exam  (+)   ?Peds ? Hematology ?negative hematology ROS ?(+)   ?Anesthesia Other Findings ? ? Reproductive/Obstetrics ? ?  ? ? ? ? ? ? ? ? ? ? ? ? ? ?  ?  ? ? ? ? ? ? ? ? ?Anesthesia Physical ?Anesthesia Plan ? ?ASA: 3 ? ?Anesthesia Plan: MAC  ? ?Post-op Pain Management:   ? ?Induction: Intravenous ? ?PONV Risk Score and Plan: 0 and Propofol infusion ? ?Airway Management Planned: Natural Airway and Simple Face Mask ? ?Additional Equipment: None ? ?Intra-op Plan:  ? ?Post-operative Plan:  ? ?Informed Consent: I have reviewed the patients History and Physical, chart, labs and discussed the procedure including the risks, benefits and alternatives for the proposed anesthesia with the patient or authorized representative who has indicated his/her understanding and acceptance.  ? ? ? ? ? ?Plan Discussed with: CRNA ? ?Anesthesia Plan Comments:   ? ? ? ? ? ? ?Anesthesia Quick Evaluation ? ?

## 2021-08-21 DIAGNOSIS — N390 Urinary tract infection, site not specified: Secondary | ICD-10-CM | POA: Diagnosis not present

## 2021-08-21 DIAGNOSIS — R300 Dysuria: Secondary | ICD-10-CM | POA: Diagnosis not present

## 2021-08-27 NOTE — Progress Notes (Unsigned)
Cardiology Office Note   Date:  08/28/2021   ID:  [name], DOB 08/07/1955, MRN [id number]  PCP:  [name], MD  Cardiologist:   Minus Breeding, MD Referring:  [name], MD    Chief Complaint  Patient presents with   Aortic Stenosis      History of Present Illness:   [name] is a 66 y.o. female who presents for evaluation of CAD and systolic HF.  She has had a history of CABG, bilateral lower extremity amputations chronic kidney disease.  She was admitted to [facility] .   She had acute on chronic heart failure.  Her discharge weight was 177 pounds.  She does have chronic renal insufficiency with her last creatinine being 1.74.  An echocardiogram in September at [facility] demonstrated an EF of 30 to 35%.  There was thinning and akinesis of the mid anterior, mid anteroseptal, basal anterior and basal anteroseptal segments.  There was moderately thickened aortic valve with severe aortic stenosis.  There was mild mitral regurgitation.  I was going to consider cardiac cath but her creat was increased when I saw her in the office for the first visit.  The creat was 2.83.  I reduced her Demadex.  Echo was ordered.  Preliminary reading still demonstrates that the ejection fraction is reduced with severe aortic stenosis.  She was admitted to [facility] in Dec with respiratory failure.  She was COVID positive.  She had elevated trop with demand ischemia.    EF was again 35 - 40%.  She was back in the hospital in Feb with syncope.  She had hypotension.  She also had anemia.  Colonoscopy did not demonstrate active bleed.  She had AKI.    She had a TEE.    This demonstrated severe aortic stenosis.  There was moderate MR.  EF was 40 - 45%.  The tricuspid valve was heavily calcified.  There was moderate mitral regurgitation secondary to a restricted posterior leaflet.  There is mild mitral valve stenosis.  Aortic stenosis was severe.  This is expected to be low-flow low gradient.   Was heavily calcified.  There was plaque in the aorta noted.  She comes back today to talk about next steps.  She is here with her family.  She has had no new symptoms.  She gets around in a wheelchair.  She has prostheses. The patient denies any new symptoms such as chest discomfort, neck or arm discomfort. There has been no new shortness of breath, PND or orthopnea. There have been no reported palpitations, presyncope or syncope.    Past Medical History:  Diagnosis Date   Acute congestive heart failure 03/29/2018   Acute encephalopathy 09/02/2013   Anemia, normocytic normochromic 03/14/2008   Arthritis    "hands"   Atherosclerosis of native arteries of the extremities with ulceration 07/14/2013   Atherosclerosis of native artery of extremity with intermittent claudication 09/16/2011   CAD (coronary artery disease) of artery bypass graft 03/14/2008   Chronic kidney disease (CKD) 12/26/2015   stage 3 due to type 2 diabetes mellitus   Chronic pain syndrome 73/71/0626   Complication of anesthesia    pt felt like she had a hard time waking up after surgery in Feb. 2015.   Coronary artery disease involving native coronary artery without angina pectoris 09/25/2015   Essential hypertension 03/14/2008   Gastroesophageal reflux disease with esophagitis 07/09/2016   Generalized anxiety disorder with panic attacks    History  of one panic attack   GERD (gastroesophageal reflux disease)    takes Zantac   Hyperlipidemia, unspecified 03/14/2008   Localization-related symptomatic epilepsy and epileptic syndromes with complex partial seizures, not intractable, without status epilepticus 06/27/2014   Major depressive disorder 03/14/2008   Neuropathy    Osteomyelitis of ankle or foot, left, acute 06/17/2013   Peripheral vascular disease    PVD (peripheral vascular disease) 03/25/2012   Type 2 diabetes mellitus with diabetic mononeuropathy, with long-term current use of insulin 03/14/2008    Past  Surgical History:  Procedure Laterality Date   AMPUTATION Left 08/22/2013   Procedure: LEFT BELOW KNEE AMPUTATION;  Surgeon: Newt Minion, MD;  Location: [place];  Service: Orthopedics;  Laterality: Left;   ANKLE FUSION Left 05/11/2013   TIBIOCALCANEAL FUSION    ANKLE FUSION Left 05/12/2013   Procedure: LEFT TIBIOCALCANEAL FUSION;  Surgeon: Newt Minion, MD;  Location: [place];  Service: Orthopedics;  Laterality: Left;  Left Tibiocalcaneal Fusion   APPLICATION OF WOUND VAC  06/17/2013   Procedure: APPLICATION OF WOUND VAC;  Surgeon: Newt Minion, MD;  Location: [place];  Service: Orthopedics;;   BELOW KNEE LEG AMPUTATION Right Jan. 2008   BUBBLE STUDY  07/31/2021   Procedure: BUBBLE STUDY;  Surgeon: Geralynn Rile, MD;  Location: [place];  Service: Cardiovascular;;   CAROTID ENDARTERECTOMY Right    CATARACT EXTRACTION Left 12/25/14   CHOLECYSTECTOMY     COLONOSCOPY     COLONOSCOPY N/A 04/24/2021   Procedure: COLONOSCOPY;  Surgeon: Toledo, Benay Pike, MD;  Location: [facility];  Service: Gastroenterology;  Laterality: N/A;   CORONARY ARTERY BYPASS GRAFT  12/01/2007   "CABG X4" (05/12/2013)   DILATION AND CURETTAGE OF UTERUS     I & D EXTREMITY Left 06/17/2013   Procedure: IRRIGATION AND DEBRIDEMENT EXTREMITY, PLACEMENT ANTIBIOTIC STIMULAN BEADS;  Surgeon: Newt Minion, MD;  Location: [place];  Service: Orthopedics;  Laterality: Left;   INTRAMEDULLARY (IM) NAIL INTERTROCHANTERIC Left 08/20/2018   Procedure: INTRAMEDULLARY (IM) NAIL INTERTROCHANTRIC;  Surgeon: Shona Needles, MD;  Location: [place];  Service: Orthopedics;  Laterality: Left;   IRRIGATION AND DEBRIDEMENT ABSCESS Left 06/17/2013   ANKLE           DR [name]   TEE WITHOUT CARDIOVERSION N/A 07/31/2021   Procedure: TRANSESOPHAGEAL ECHOCARDIOGRAM (TEE);  Surgeon: Geralynn Rile, MD;  Location: [place];  Service: Cardiovascular;  Laterality: N/A;   TOE AMPUTATION Right    "took off a couple toes before the amputation"    TONSILLECTOMY       Current Outpatient Medications  Medication Sig Dispense Refill   acetaminophen-codeine (TYLENOL #3) 300-30 MG tablet Take 1 tablet by mouth every 6 (six) hours as needed for severe pain.     albuterol (VENTOLIN HFA) 108 (90 Base) MCG/ACT inhaler Inhale 1-2 puffs into the lungs every 6 (six) hours as needed for wheezing or shortness of breath.      ALPRAZolam (XANAX) 0.5 MG tablet Take 0.5 mg by mouth 2 (two) times daily as needed for anxiety.     aspirin 81 MG tablet Take 1 tablet (81 mg total) by mouth daily. 90 tablet 0   carvedilol (COREG) 6.25 MG tablet Take 1 tablet (6.25 mg total) by mouth 2 (two) times daily. 180 tablet 3   clopidogrel (PLAVIX) 75 MG tablet Take 1 tablet (75 mg total) by mouth daily. 90 tablet 0   cycloSPORINE (RESTASIS) 0.05 % ophthalmic emulsion Place 1 drop into both  eyes 2 (two) times daily as needed (dry eyes).     diclofenac Sodium (VOLTAREN) 1 % GEL Apply 1 application. topically 4 (four) times daily as needed (pain).     empagliflozin (JARDIANCE) 10 MG TABS tablet Take 10 mg by mouth daily.     ezetimibe (ZETIA) 10 MG tablet Take 10 mg by mouth daily.     insulin lispro (HUMALOG) 100 UNIT/ML injection Inject 12 Units into the skin 2 (two) times daily with a meal.     LANTUS SOLOSTAR 100 UNIT/ML Solostar Pen Inject 38 Units into the skin at bedtime.     Levetiracetam 750 MG TB24 Take 2 tablets (1,500 mg total) by mouth at bedtime. 60 tablet 8   lidocaine (LIDODERM) 5 % Place 1 patch onto the skin daily as needed (pain).     losartan (COZAAR) 50 MG tablet Take 50 mg by mouth daily.     Multiple Vitamins-Minerals (MULTIVITAMINS THER. W/MINERALS) TABS Take 1 tablet by mouth daily.       nitroGLYCERIN (NITROSTAT) 0.4 MG SL tablet Place 0.4 mg under the tongue every 5 (five) minutes as needed for chest pain.     pregabalin (LYRICA) 50 MG capsule TAKE 1 CAPSULE BY MOUTH EVERY NIGHT 90 capsule 2   rosuvastatin (CRESTOR) 40 MG tablet Take 40 mg by  mouth daily.     sertraline (ZOLOFT) 100 MG tablet Take 1 tablet (100 mg total) by mouth 2 (two) times daily. 180 tablet 0   torsemide (DEMADEX) 20 MG tablet Take 20 mg by mouth daily. TAKE EXTRA TABLET AS NEEDED     No current facility-administered medications for this visit.    Allergies:   Metformin hcl, Ace inhibitors, Actos [pioglitazone], Atorvastatin, Candesartan cilexetil, Gabapentin, Influenza vaccines, Isosorbide nitrate, Keflex [cephalexin], Quinapril hcl, Tramadol hcl, and Tape   ROS:  Please see the history of present illness.   Otherwise, review of systems are positive for none .   All other systems are reviewed and negative.    PHYSICAL EXAM: VS:  BP (!) 102/53   Ht '5\' 5"'$  (1.651 m)   SpO2 99%   BMI 29.45 kg/m  , BMI Body mass index is 29.45 kg/m. GEN:  No distress NECK:  No jugular venous distention at 90 degrees, waveform within normal limits, carotid upstroke brisk and symmetric, no bruits, no thyromegaly LYMPHATICS:  No cervical adenopathy LUNGS:  Clear to auscultation bilaterally BACK:  No CVA tenderness CHEST:  Unremarkable HEART:  S1 and S2 within normal limits, no S3, no S4, no clicks, no rubs, 3 out of 6 systolic murmur radiating out the outflow tract, no diastolic murmurs murmurs ABD:  Positive bowel sounds normal in frequency in pitch, no bruits, no rebound, no guarding, unable to assess midline mass or bruit with the patient seated. EXT:  2 plus pulses upper, bilateral AKA's SKIN:  No rashes no nodules NEURO:  Cranial nerves II through XII grossly intact, motor grossly intact throughout PSYCH:  Cognitively intact, oriented to person place and time    EKG:  EKG is not ordered today. NA  Recent Labs: 02/22/2021: Magnesium 2.2 04/21/2021: ALT 29; B Natriuretic Peptide 583.7 07/25/2021: BUN 34; Creatinine, Ser 1.57; Hemoglobin 10.2; Platelets 202; Potassium 3.7; Sodium 137    Lipid Panel    Component Value Date/Time   CHOL 113 08/30/2013 0550   TRIG  149 08/30/2013 0550   HDL 36 (L) 08/30/2013 0550   CHOLHDL 3.1 08/30/2013 0550   VLDL 30 08/30/2013 0550  LDLCALC 47 08/30/2013 0550      Wt Readings from Last 3 Encounters:  07/31/21 177 lb (80.3 kg)  07/02/21 176 lb (79.8 kg)  05/22/21 190 lb (86.2 kg)      Other studies Reviewed: Additional studies/ records that were reviewed today include: None Review of the above records demonstrates:  NA  ASSESSMENT AND PLAN:  ACUTE ON CHRONIC SYSTOLIC HF:   She seems to be euvolemic.  She is on good medical therapy.  No change in therapy.   AS:   This was severe on the recent TEE.    We had a long conversation about this and she would consider TAVR if a candidate and so I have sent in a referral.   HTN:   This is being managed in the context of treating his CHF .    CAD/CABG:  The patient has no new sypmtoms.  No further cardiovascular testing is indicated.  We will continue with aggressive risk reduction and meds as listed.  MITRAL REGURGITATION:   This was moderate.  This would be managed conservatively.  I do not think it needs to be addressed at this point.  AKI:    Creat most recently was 1.57 which is better than previous.  This seems to be her new baseline.  ACUTE ANEMIA:    Hgb was most recently up above 10 which is improved from 8.6.  She  has no active bleeding.  Current medicines adoes not havere reviewed at length with the patient today.  The patient  concerns regarding medicines.  The following changes have been made: None  Labs/ tests ordered today include:    None  Orders Placed This Encounter  Procedures   Ambulatory referral to Structural Heart/Valve Clinic (only at [place])     Disposition:   FU with me in August or sooner if needed.  Signed, Minus Breeding, MD  08/28/2021 5:22 PM    [facility]

## 2021-08-28 ENCOUNTER — Ambulatory Visit: Payer: Medicare Other | Admitting: Cardiology

## 2021-08-28 ENCOUNTER — Encounter: Payer: Self-pay | Admitting: Cardiology

## 2021-08-28 VITALS — BP 102/53 | Ht 65.0 in

## 2021-08-28 DIAGNOSIS — E1142 Type 2 diabetes mellitus with diabetic polyneuropathy: Secondary | ICD-10-CM | POA: Diagnosis not present

## 2021-08-28 DIAGNOSIS — I2510 Atherosclerotic heart disease of native coronary artery without angina pectoris: Secondary | ICD-10-CM

## 2021-08-28 DIAGNOSIS — I10 Essential (primary) hypertension: Secondary | ICD-10-CM | POA: Diagnosis not present

## 2021-08-28 DIAGNOSIS — I350 Nonrheumatic aortic (valve) stenosis: Secondary | ICD-10-CM

## 2021-08-28 NOTE — Patient Instructions (Signed)
  Follow-Up: At [facility], you and your health needs are our priority.  As part of our continuing mission to provide you with exceptional heart care, we have created designated Provider Care Teams.  These Care Teams include your primary Cardiologist (physician) and Advanced Practice Providers (APPs -  Physician Assistants and Nurse Practitioners) who all work together to provide you with the care you need, when you need it.  We recommend signing up for the patient portal called "MyChart".  Sign up information is provided on this After Visit Summary.  MyChart is used to connect with patients for Virtual Visits (Telemedicine).  Patients are able to view lab/test results, encounter notes, upcoming appointments, etc.  Non-urgent messages can be sent to your provider as well.   To learn more about what you can do with MyChart, go to [email or web address].    Your next appointment:   4 month(s)  The format for your next appointment:   In Person  Provider:   James Hochrein, MD       Important Information About Sugar       

## 2021-09-02 ENCOUNTER — Telehealth: Payer: Self-pay | Admitting: Cardiology

## 2021-09-02 NOTE — Telephone Encounter (Signed)
Left message for pt to contact the structural heart team to arrange evaluation.

## 2021-09-02 NOTE — Telephone Encounter (Signed)
Dr. Percival Spanish referred patient at last appointment to valve clinic. Left message for patient that referral was ordered, may take a few days to process, but she'll get a call to schedule. Routed to Arsenio Katz RN and Gerome Sam., RN

## 2021-09-02 NOTE — Telephone Encounter (Signed)
Pt returned call and is scheduled for TAVR consult on 6/29 with Dr Ali Lowe.

## 2021-09-02 NOTE — Telephone Encounter (Signed)
Pt states that she was suppose to receive a call back with a date to meet the team. Pt would like a call back. Please advise

## 2021-09-04 DIAGNOSIS — N390 Urinary tract infection, site not specified: Secondary | ICD-10-CM | POA: Diagnosis not present

## 2021-09-12 ENCOUNTER — Institutional Professional Consult (permissible substitution): Payer: Medicare Other | Admitting: Internal Medicine

## 2021-09-19 ENCOUNTER — Encounter: Payer: Self-pay | Admitting: Cardiovascular Disease

## 2021-09-19 ENCOUNTER — Ambulatory Visit: Payer: Medicare Other | Admitting: Cardiovascular Disease

## 2021-09-19 VITALS — BP 130/60 | HR 87 | Ht 65.0 in | Wt 185.4 lb

## 2021-09-19 DIAGNOSIS — Z01810 Encounter for preprocedural cardiovascular examination: Secondary | ICD-10-CM | POA: Diagnosis not present

## 2021-09-19 DIAGNOSIS — I350 Nonrheumatic aortic (valve) stenosis: Secondary | ICD-10-CM

## 2021-09-19 NOTE — Progress Notes (Signed)
Cardiology Office Note:    Date:  09/21/2021   ID:  [name], DOB 1955-07-17, MRN [id number]  PCP:  [name], [place] [facility] Cardiologist:  Minus Breeding, MD     Referring MD: [name], MD   Chief Complaint  Patient presents with   Aortic Stenosis    History of Present Illness:    [name] is a 66 y.o. female referred by Dr. Percival Spanish for evaluation of aortic stenosis.  The patient has a complex medical history with coronary artery disease and multivessel CABG in 2009, below-knee amputation of the right leg in 2008 and left leg in 2015, type 2 diabetes requiring insulin, peripheral neuropathy, Stage IIIb chronic kidney disease, and chronic anemia.  The patient was hospitalized with respiratory failure in December 2022 and was found to be COVID-positive but also had elevated troponin consistent with demand ischemia.  She was hospitalized in February with syncope and hypotension.  Cardiac evaluation was limited by her kidney disease and there were concerns about performing cardiac catheterization because of risk of AKI.  She ultimately was referred for a TEE for better assessment of her aortic valve disease.  This demonstrated partial fusion of the right and left coronary cusps with a V-max of 3.5, mean gradient of 28, dimensionless index of 0.23, and calculated aortic valve area of 0.73 cm.  All of these findings were felt to be consistent with severe low-flow low gradient aortic stenosis and the patient is referred now for further evaluation and discussion of treatment options.  She has also been found to have degenerative mitral valve disease with moderate mitral regurgitation.  She has been treated for chronic iron deficiency anemia without evidence of active bleeding.  [name] here with her family today. She is not able to ambulate over the past year because of ulcers on her amputation sites interacting with her prostheses. She  has recently gotten new prostheses and is doing a little better in this regard. She has had multiple hospitalizations for acute on chronic systolic heart failure in different hospital systems over the past year. She continues to have exertional dyspnea, but no chest pain, orthopnea, or PND. She denies lightheadedness or syncope. She is able to transfer on her own.   Past Medical History:  Diagnosis Date   Acute congestive heart failure 03/29/2018   Acute encephalopathy 09/02/2013   Anemia, normocytic normochromic 03/14/2008   Arthritis    "hands"   Atherosclerosis of native arteries of the extremities with ulceration 07/14/2013   Atherosclerosis of native artery of extremity with intermittent claudication 09/16/2011   CAD (coronary artery disease) of artery bypass graft 03/14/2008   Chronic kidney disease (CKD) 12/26/2015   stage 3 due to type 2 diabetes mellitus   Chronic pain syndrome 27/25/3664   Complication of anesthesia    pt felt like she had a hard time waking up after surgery in Feb. 2015.   Coronary artery disease involving native coronary artery without angina pectoris 09/25/2015   Essential hypertension 03/14/2008   Gastroesophageal reflux disease with esophagitis 07/09/2016   Generalized anxiety disorder with panic attacks    History of one panic attack   GERD (gastroesophageal reflux disease)    takes Zantac   Hyperlipidemia, unspecified 03/14/2008   Localization-related symptomatic epilepsy and epileptic syndromes with complex partial seizures, not intractable, without status epilepticus 06/27/2014   Major depressive disorder 03/14/2008   Neuropathy    Osteomyelitis of ankle or foot,  left, acute 06/17/2013   Peripheral vascular disease    PVD (peripheral vascular disease) 03/25/2012   Type 2 diabetes mellitus with diabetic mononeuropathy, with long-term current use of insulin 03/14/2008    Past Surgical History:  Procedure Laterality Date   AMPUTATION Left 08/22/2013    Procedure: LEFT BELOW KNEE AMPUTATION;  Surgeon: Newt Minion, MD;  Location: [facility];  Service: Orthopedics;  Laterality: Left;   ANKLE FUSION Left 05/11/2013   TIBIOCALCANEAL FUSION    ANKLE FUSION Left 05/12/2013   Procedure: LEFT TIBIOCALCANEAL FUSION;  Surgeon: Newt Minion, MD;  Location: [facility];  Service: Orthopedics;  Laterality: Left;  Left Tibiocalcaneal Fusion   APPLICATION OF WOUND VAC  06/17/2013   Procedure: APPLICATION OF WOUND VAC;  Surgeon: Newt Minion, MD;  Location: [facility];  Service: Orthopedics;;   BELOW KNEE LEG AMPUTATION Right Jan. 2008   BUBBLE STUDY  07/31/2021   Procedure: BUBBLE STUDY;  Surgeon: Geralynn Rile, MD;  Location: [facility];  Service: Cardiovascular;;   CAROTID ENDARTERECTOMY Right    CATARACT EXTRACTION Left 12/25/14   CHOLECYSTECTOMY     COLONOSCOPY     COLONOSCOPY N/A 04/24/2021   Procedure: COLONOSCOPY;  Surgeon: Toledo, Benay Pike, MD;  Location: [facility];  Service: Gastroenterology;  Laterality: N/A;   CORONARY ARTERY BYPASS GRAFT  12/01/2007   "CABG X4" (05/12/2013)   DILATION AND CURETTAGE OF UTERUS     I & D EXTREMITY Left 06/17/2013   Procedure: IRRIGATION AND DEBRIDEMENT EXTREMITY, PLACEMENT ANTIBIOTIC STIMULAN BEADS;  Surgeon: Newt Minion, MD;  Location: [facility];  Service: Orthopedics;  Laterality: Left;   INTRAMEDULLARY (IM) NAIL INTERTROCHANTERIC Left 08/20/2018   Procedure: INTRAMEDULLARY (IM) NAIL INTERTROCHANTRIC;  Surgeon: Shona Needles, MD;  Location: [facility];  Service: Orthopedics;  Laterality: Left;   IRRIGATION AND DEBRIDEMENT ABSCESS Left 06/17/2013   ANKLE           DR [name]   TEE WITHOUT CARDIOVERSION N/A 07/31/2021   Procedure: TRANSESOPHAGEAL ECHOCARDIOGRAM (TEE);  Surgeon: Geralynn Rile, MD;  Location: [facility];  Service: Cardiovascular;  Laterality: N/A;   TOE AMPUTATION Right    "took off a couple toes before the amputation"   TONSILLECTOMY      Current Medications: Current Meds  Medication Sig    acetaminophen-codeine (TYLENOL #3) 300-30 MG tablet Take 1 tablet by mouth every 6 (six) hours as needed for severe pain.   albuterol (VENTOLIN HFA) 108 (90 Base) MCG/ACT inhaler Inhale 1-2 puffs into the lungs every 6 (six) hours as needed for wheezing or shortness of breath.    ALPRAZolam (XANAX) 0.5 MG tablet Take 0.5 mg by mouth 2 (two) times daily as needed for anxiety.   aspirin 81 MG tablet Take 1 tablet (81 mg total) by mouth daily.   carvedilol (COREG) 6.25 MG tablet Take 1 tablet (6.25 mg total) by mouth 2 (two) times daily.   clopidogrel (PLAVIX) 75 MG tablet Take 1 tablet (75 mg total) by mouth daily.   cycloSPORINE (RESTASIS) 0.05 % ophthalmic emulsion Place 1 drop into both eyes 2 (two) times daily as needed (dry eyes).   diclofenac Sodium (VOLTAREN) 1 % GEL Apply 1 application. topically 4 (four) times daily as needed (pain).   empagliflozin (JARDIANCE) 10 MG TABS tablet Take 10 mg by mouth daily.   ezetimibe (ZETIA) 10 MG tablet Take 10 mg by mouth daily.   insulin lispro (HUMALOG) 100 UNIT/ML injection Inject 12 Units into the skin 2 (two) times  daily with a meal.   LANTUS SOLOSTAR 100 UNIT/ML Solostar Pen Inject 38 Units into the skin at bedtime.   Levetiracetam 750 MG TB24 Take 2 tablets (1,500 mg total) by mouth at bedtime.   lidocaine (LIDODERM) 5 % Place 1 patch onto the skin daily as needed (pain).   losartan (COZAAR) 50 MG tablet Take 50 mg by mouth daily.   Multiple Vitamins-Minerals (MULTIVITAMINS THER. W/MINERALS) TABS Take 1 tablet by mouth daily.     mupirocin ointment (BACTROBAN) 2 % Apply 1 Application topically 3 (three) times daily.   nitroGLYCERIN (NITROSTAT) 0.4 MG SL tablet Place 0.4 mg under the tongue every 5 (five) minutes as needed for chest pain.   pioglitazone (ACTOS) 15 MG tablet Take by mouth.   pregabalin (LYRICA) 50 MG capsule TAKE 1 CAPSULE BY MOUTH EVERY NIGHT   rosuvastatin (CRESTOR) 40 MG tablet Take 40 mg by mouth daily.   sertraline (ZOLOFT)  100 MG tablet Take 1 tablet (100 mg total) by mouth 2 (two) times daily.   torsemide (DEMADEX) 20 MG tablet Take 20 mg by mouth daily. TAKE EXTRA TABLET AS NEEDED     Allergies:   Metformin hcl, Ace inhibitors, Actos [pioglitazone], Atorvastatin, Candesartan cilexetil, Gabapentin, Influenza vaccines, Isosorbide nitrate, Keflex [cephalexin], Quinapril hcl, Tramadol hcl, and Tape   Social History   Socioeconomic History   Marital status: Married    Spouse name: Not on file   Number of children: 2   Years of education: 14   Highest education level: Associate degree: occupational, Hotel manager, or vocational program  Occupational History   Occupation: Disability  Tobacco Use   Smoking status: Never   Smokeless tobacco: Never  Vaping Use   Vaping Use: Never used  Substance and Sexual Activity   Alcohol use: No   Drug use: No   Sexual activity: Not Currently  Other Topics Concern   Not on file  Social History Narrative   Right handed    Lives in a one story home   Drinks caffeine    Social Determinants of Health   Financial Resource Strain: Not on file  Food Insecurity: Not on file  Transportation Needs: Not on file  Physical Activity: Not on file  Stress: Not on file  Social Connections: Not on file     Family History: The patient's family history includes Alzheimer's disease in her maternal grandfather; Cancer in her mother; Diabetes in her father; Heart disease in her father; Hyperlipidemia in her father; Hypertension in her father.  ROS:   Please see the history of present illness.    All other systems reviewed and are negative.  EKGs/Labs/Other Studies Reviewed:    The following studies were reviewed today: Echo  1. Left ventricular ejection fraction, by estimation, is 35 to 40%. The  left ventricle has moderately decreased function. Left ventricular  endocardial border not optimally defined to evaluate regional wall motion.  There is mild left ventricular   hypertrophy. Left ventricular diastolic parameters are indeterminate.   2. Right ventricular systolic function is normal. The right ventricular  size is normal. There is mildly elevated pulmonary artery systolic  pressure.   3. Left atrial size was moderately dilated.   4. The mitral valve is normal in structure. No evidence of mitral valve  regurgitation. Mild to moderate mitral stenosis. The mean mitral valve  gradient is 5.0 mmHg. Severe mitral annular calcification.   5. The aortic valve is calcified. Aortic valve regurgitation is not  visualized. Moderate to severe  aortic valve stenosis. Aortic valve mean  gradient measures 24.0 mmHg. Aortic valve is not well seen. Possible low  flow-low gradient severe aortic  stenosis.   FINDINGS   Left Ventricle: Left ventricular ejection fraction, by estimation, is 35  to 40%. The left ventricle has moderately decreased function. Left  ventricular endocardial border not optimally defined to evaluate regional  wall motion. Definity contrast agent  was given IV to delineate the left ventricular endocardial borders. The  left ventricular internal cavity size was normal in size. There is mild  left ventricular hypertrophy. Left ventricular diastolic parameters are  indeterminate.   Right Ventricle: The right ventricular size is normal. No increase in  right ventricular wall thickness. Right ventricular systolic function is  normal. There is mildly elevated pulmonary artery systolic pressure. The  tricuspid regurgitant velocity is 3.03   m/s, and with an assumed right atrial pressure of 5 mmHg, the estimated  right ventricular systolic pressure is 54.6 mmHg.   Left Atrium: Left atrial size was moderately dilated.   Right Atrium: Right atrial size was normal in size.   Pericardium: There is no evidence of pericardial effusion.   Mitral Valve: The mitral valve is normal in structure. Severe mitral  annular calcification. No evidence of  mitral valve regurgitation. Mild to  moderate mitral valve stenosis. MV peak gradient, 9.1 mmHg. The mean  mitral valve gradient is 5.0 mmHg.   Tricuspid Valve: The tricuspid valve is normal in structure. Tricuspid  valve regurgitation is mild . No evidence of tricuspid stenosis.   Aortic Valve: The aortic valve is calcified. Aortic valve regurgitation is  not visualized. Moderate to severe aortic stenosis is present. Aortic  valve mean gradient measures 24.0 mmHg. Aortic valve peak gradient  measures 37.9 mmHg. Aortic valve area, by  VTI measures 0.91 cm.   Pulmonic Valve: The pulmonic valve was normal in structure. Pulmonic valve  regurgitation is mild. No evidence of pulmonic stenosis.   Aorta: The aortic root is normal in size and structure.   Venous: The inferior vena cava was not well visualized.   IAS/Shunts: No atrial level shunt detected by color flow Doppler.      LEFT VENTRICLE  PLAX 2D  LVIDd:         3.49 cm   Diastology  LVIDs:         2.85 cm   LV e' medial:    3.59 cm/s  LV PW:         1.05 cm   LV E/e' medial:  43.7  LV IVS:        0.80 cm   LV e' lateral:   6.09 cm/s  LVOT diam:     2.00 cm   LV E/e' lateral: 25.8  LV SV:         47  LV SV Index:   24  LVOT Area:     3.14 cm      LEFT ATRIUM           Index  LA diam:      4.50 cm 2.33 cm/m  LA Vol (A4C): 67.7 ml 34.98 ml/m   AORTIC VALVE                     PULMONIC VALVE  AV Area (Vmax):    0.79 cm      PV Vmax:       1.05 m/s  AV Area (Vmean):   0.93 cm  PV Vmean:      69.500 cm/s  AV Area (VTI):     0.91 cm      PV VTI:        0.177 m  AV Vmax:           308.00 cm/s   PV Peak grad:  4.4 mmHg  AV Vmean:          183.200 cm/s  PV Mean grad:  2.0 mmHg  AV VTI:            0.516 m  AV Peak Grad:      37.9 mmHg  AV Mean Grad:      24.0 mmHg  LVOT Vmax:         77.70 cm/s  LVOT Vmean:        54.400 cm/s  LVOT VTI:          0.149 m  LVOT/AV VTI ratio: 0.29     AORTA  Ao Root diam: 2.30 cm    MITRAL VALVE                TRICUSPID VALVE  MV Area (PHT): 3.77 cm     TR Peak grad:   36.7 mmHg  MV Area VTI:   1.19 cm     TR Vmax:        303.00 cm/s  MV Peak grad:  9.1 mmHg  MV Mean grad:  5.0 mmHg     SHUNTS  MV Vmax:       1.51 m/s     Systemic VTI:  0.15 m  MV Vmean:      107.0 cm/s   Systemic Diam: 2.00 cm  MV Decel Time: 201 msec  MV E velocity: 157.00 cm/s  MV A velocity: 140.00 cm/s  MV E/A ratio:  1.12   TEE: IMPRESSIONS     1. Tricuspid AoV with severe calcifications. There is partial fusion of  the RCC/LCC which could be acquired. Vmax 3.52 m/s, MG 28 mmHG, AVA 0.73  cm2, DI 0.23. Aortic stenosis is severe. Direct 3D planimetry 0.87 cm2.  Overall, suspect low flow low  gradient AS. CW jet is rounded consistent with severe AS as well. The  aortic valve is tricuspid. There is severe calcifcation of the aortic  valve. There is severe thickening of the aortic valve. Aortic valve  regurgitation is not visualized.   2. Degnerative mitral valve disease. Moderate mitral regurgitation  secondary to restricted PMVL from calcifications (IIIB). 2D PISA radius  0.7 cm, ERO 0.23 cm2, R vol 39 cc, RF 38%. 3D VCA 0.24 cm2. PV flow is  systolic dominant or blunted in all veins.  Mild mitral stenosis is present. MG 6.0 mmHG @ 82 bpm. MVA by 3D  planimetry 1.97 cm2. The mitral valve is degenerative. Moderate mitral  valve regurgitation. Mild mitral stenosis. Moderate to severe mitral  annular calcification.   3. Left ventricular ejection fraction, by estimation, is 40 to 45%. The  left ventricle has mildly decreased function. The left ventricle  demonstrates global hypokinesis.   4. Right ventricular systolic function is normal. The right ventricular  size is mildly enlarged.   5. Left atrial size was mild to moderately dilated. No left atrial/left  atrial appendage thrombus was detected. The LAA emptying velocity was 54  cm/s.   6. Right atrial size was mildly dilated.    7. There is mild (Grade II) layered plaque involving the descending  aorta.   8. Agitated saline contrast  bubble study was negative, with no evidence  of any interatrial shunt.   EKG:  EKG is ordered today.  The ekg ordered today demonstrates NSR with LBBB, HR 87 bpm  Recent Labs: 02/22/2021: Magnesium 2.2 04/21/2021: ALT 29; B Natriuretic Peptide 583.7 07/25/2021: BUN 34; Creatinine, Ser 1.57; Hemoglobin 10.2; Platelets 202; Potassium 3.7; Sodium 137  Recent Lipid Panel    Component Value Date/Time   CHOL 113 08/30/2013 0550   TRIG 149 08/30/2013 0550   HDL 36 (L) 08/30/2013 0550   CHOLHDL 3.1 08/30/2013 0550   VLDL 30 08/30/2013 0550   LDLCALC 47 08/30/2013 0550     Risk Assessment/Calculations:           Physical Exam:    VS:  BP 130/60   Pulse 87   Ht '5\' 5"'$  (1.651 m)   Wt 185 lb 6.4 oz (84.1 kg)   SpO2 99%   BMI 30.85 kg/m     Wt Readings from Last 3 Encounters:  09/19/21 185 lb 6.4 oz (84.1 kg)  07/31/21 177 lb (80.3 kg)  07/02/21 176 lb (79.8 kg)     GEN:  Well nourished, well developed in no acute distress, in wheelchair HEENT: Normal NECK: No JVD; No carotid bruits LYMPHATICS: No lymphadenopathy CARDIAC: RRR, 3/6 harsh systolic murmur at the RUSB, A2 present but diminished RESPIRATORY:  Clear to auscultation without rales, wheezing or rhonchi  ABDOMEN: Soft, non-tender, non-distended MUSCULOSKELETAL:  BL BKA's with prosthetics in place SKIN: Warm and dry NEUROLOGIC:  Alert and oriented x 3 PSYCHIATRIC:  Normal affect   ASSESSMENT:    1. Pre-procedural cardiovascular examination   2. Nonrheumatic aortic valve stenosis    PLAN:    In order of problems listed above:  The patient has severe, stage D2, low flow low gradient aortic stenosis with reduced LVEF. Her echo is reviewed and shows a Vmax of 3.5 m/s, AVA 0.73, mean gradient 28 mmHg, and DI of 0.23. the valve leaflets are severely calcified and restricted. As outlined above, she has had multiple  CHF hospitalizations over the past year and has significant comorbidities of diabetes, bilateral leg amputations, LV dysfunction, prior CABG, and stage 3b CKD. I have reviewed the natural history of aortic stenosis with the patient and their family members who are present today. We have discussed the limitations of medical therapy and the poor prognosis associated with symptomatic aortic stenosis. We have reviewed potential treatment options, including palliative medical therapy, conventional surgical aortic valve replacement, and transcatheter aortic valve replacement. We discussed treatment options in the context of the patient's specific comorbid medical conditions.  TAVR is indicated to prevent further clinical deterioration, reduce risk of heart failure progression and hospitalization, and possibly reduce mortality. The patient will require further evaluation with cardiac catheterization to assess hemodynamics and graft patency. I have reviewed the risks, indications, and alternatives to cardiac catheterization, possible angioplasty, and stenting with the patient. Risks include but are not limited to bleeding, infection, vascular injury, stroke, myocardial infection, arrhythmia, kidney injury, radiation-related injury in the case of prolonged fluoroscopy use, emergency cardiac surgery, and death. The patient understands the risks of serious complication is 1-2 in [id number] with diagnostic cardiac cath and 1-2% or less with angioplasty/stenting.  Once her cath is completed, she will require CTA studies of the heart and the chest, abdomen, and pelvis to assess for anatomic suitability for TAVR. Once her studies are completed, she will be referred for formal cardiac surgical evaluation as part of a multidisciplinary approach to  her care.       Shared Decision Making/Informed Consent The risks [stroke (1 in 1000), death (1 in 1000), kidney failure [usually temporary] (1 in 500), bleeding (1 in 200), allergic  reaction [possibly serious] (1 in 200)], benefits (diagnostic support and management of coronary artery disease) and alternatives of a cardiac catheterization were discussed in detail with Ms. Trumbo and she is willing to proceed.    Medication Adjustments/Labs and Tests Ordered: Current medicines are reviewed at length with the patient today.  Concerns regarding medicines are outlined above.  Orders Placed This Encounter  Procedures   Basic metabolic panel   CBC   EKG 12-Lead   No orders of the defined types were placed in this encounter.   Patient Instructions  Medication Instructions:  Your physician recommends that you continue on your current medications as directed. Please refer to the Current Medication list given to you today.  *If you need a refill on your cardiac medications before your next appointment, please call your pharmacy*   Lab Work: CBC, BMET (within 7 days of cath) If you have labs (blood work) drawn today and your tests are completely normal, you will receive your results only by: [facility] (if you have MyChart) OR A paper copy in the mail If you have any lab test that is abnormal or we need to change your treatment, we will call you to review the results.   Testing/Procedures: R & L heart cath Your physician has requested that you have a cardiac catheterization. Cardiac catheterization is used to diagnose and/or treat various heart conditions. Doctors may recommend this procedure for a number of different reasons. The most common reason is to evaluate chest pain. Chest pain can be a symptom of coronary artery disease (CAD), and cardiac catheterization can show whether plaque is narrowing or blocking your heart's arteries. This procedure is also used to evaluate the valves, as well as measure the blood flow and oxygen levels in different parts of your heart. For further information please visit [email or web address]. Please follow instruction sheet, as  given.  Follow-Up: At [facility], you and your health needs are our priority.  As part of our continuing mission to provide you with exceptional heart care, we have created designated Provider Care Teams.  These Care Teams include your primary Cardiologist (physician) and Advanced Practice Providers (APPs -  Physician Assistants and Nurse Practitioners) who all work together to provide you with the care you need, when you need it.  Your next appointment:   Structural Team Will Follow  Provider:   Sherren Mocha, MD :1}    Other Instructions  [facility] OFFICE [place], [place] [address] Dept: [phone] Loc: [place]  09/19/2021  You are scheduled for a Cardiac Catheterization on Monday, July 24 with Dr. Sherren Mocha.  1. Please arrive at the Main Entrance A at [facility]: [place], [place] [address] at 7:30 AM (This time is two hours before your procedure to ensure your preparation). Free valet parking service is available.   Special note: Every effort is made to have your procedure done on time. Please understand that emergencies sometimes delay scheduled procedures.  2. Diet: Do not eat solid foods after midnight.  You may have clear liquids until 5 AM upon the day of the procedure.  3. Labs: You will need to have blood drawn on Monday, July  17 at [facility]. [address]  Open: 7:30am - 5pm    Phone: [phone]. You do not need to be fasting.  4. Medication instructions in preparation for your procedure:   Contrast Allergy: No  TAKE HALF DOSE OF LANTUS INSULIN NIGHT BEFORE CATH  DO NOT TAKE Losartan, Torsemide, Jardiance, or Pioglitazone day of CATH  On the morning of your procedure, take Aspirin '81MG'$ , Clopidogrel, and any morning medicines NOT listed above.  You may use sips of  water.  5. Plan to go home the same day, you will only stay overnight if medically necessary. 6. You MUST have a responsible adult to drive you home. 7. An adult MUST be with you the first 24 hours after you arrive home. 8. Bring a current list of your medications, and the last time and date medication taken. 9. Bring ID and current insurance cards. 10.Please wear clothes that are easy to get on and off and wear slip-on shoes.  Thank you for allowing [place] to care for you!   -- [facility]   Important Information About Sugar         Signed, Sherren Mocha, MD  09/21/2021 3:08 PM    [facility]

## 2021-09-19 NOTE — Progress Notes (Signed)
Pt unable to complete 45M walk in clinic today. She is double amputee patient and has prosthetic limbs, but is very sore and states she's swollen so unable to walk in the prosthesis.

## 2021-09-19 NOTE — Patient Instructions (Signed)
Medication Instructions:  Your physician recommends that you continue on your current medications as directed. Please refer to the Current Medication list given to you today.  *If you need a refill on your cardiac medications before your next appointment, please call your pharmacy*   Lab Work: CBC, BMET (within 7 days of cath) If you have labs (blood work) drawn today and your tests are completely normal, you will receive your results only by: [place] (if you have MyChart) OR A paper copy in the mail If you have any lab test that is abnormal or we need to change your treatment, we will call you to review the results.   Testing/Procedures: R & L heart cath Your physician has requested that you have a cardiac catheterization. Cardiac catheterization is used to diagnose and/or treat various heart conditions. Doctors may recommend this procedure for a number of different reasons. The most common reason is to evaluate chest pain. Chest pain can be a symptom of coronary artery disease (CAD), and cardiac catheterization can show whether plaque is narrowing or blocking your heart's arteries. This procedure is also used to evaluate the valves, as well as measure the blood flow and oxygen levels in different parts of your heart. For further information please visit [email or web address]. Please follow instruction sheet, as given.  Follow-Up: At [facility], you and your health needs are our priority.  As part of our continuing mission to provide you with exceptional heart care, we have created designated Provider Care Teams.  These Care Teams include your primary Cardiologist (physician) and Advanced Practice Providers (APPs -  Physician Assistants and Nurse Practitioners) who all work together to provide you with the care you need, when you need it.  Your next appointment:   Structural Team Will Follow  Provider:   Sherren Mocha, MD :1}    Other Instructions  [facility] OFFICE [place], [place] [address] Dept: [phone] Loc: [place]  09/19/2021  You are scheduled for a Cardiac Catheterization on Monday, July 24 with Dr. Sherren Mocha.  1. Please arrive at the Main Entrance A at [facility]: [place], [place] [address] at 7:30 AM (This time is two hours before your procedure to ensure your preparation). Free valet parking service is available.   Special note: Every effort is made to have your procedure done on time. Please understand that emergencies sometimes delay scheduled procedures.  2. Diet: Do not eat solid foods after midnight.  You may have clear liquids until 5 AM upon the day of the procedure.  3. Labs: You will need to have blood drawn on Monday, July 17 at [facility]. [address]  Open: 7:30am - 5pm    Phone: [phone]. You do not need to be fasting.  4. Medication instructions in preparation for your procedure:   Contrast Allergy: No  TAKE HALF DOSE OF LANTUS INSULIN NIGHT BEFORE CATH  DO NOT TAKE Losartan, Torsemide, Jardiance, or Pioglitazone day of CATH  On the morning of your procedure, take Aspirin '81MG'$ , Clopidogrel, and any morning medicines NOT listed above.  You may use sips of water.  5. Plan to go home the same day, you will only stay overnight if medically necessary. 6. You MUST have a responsible adult to drive you home. 7. An adult MUST be with you the first 24 hours  after you arrive home. 8. Bring a current list of your medications, and the last time and date medication taken. 9. Bring ID and current insurance cards. 10.Please wear clothes that are easy to get on and off and wear slip-on shoes.  Thank you for allowing [place] to care for you!   -- [facility]   Important Information About Sugar

## 2021-09-19 NOTE — H&P (View-Only) (Signed)
Cardiology Office Note:    Date:  09/21/2021   ID:  [name], DOB 04-Oct-1955, MRN [id number]  PCP:  [name], [facility] Providers Cardiologist:  Minus Breeding, MD     Referring MD: [name], MD   Chief Complaint  Patient presents with   Aortic Stenosis    History of Present Illness:    [name] is a 66 y.o. female referred by Dr. Percival Spanish for evaluation of aortic stenosis.  The patient has a complex medical history with coronary artery disease and multivessel CABG in 2009, below-knee amputation of the right leg in 2008 and left leg in 2015, type 2 diabetes requiring insulin, peripheral neuropathy, Stage IIIb chronic kidney disease, and chronic anemia.  The patient was hospitalized with respiratory failure in December 2022 and was found to be COVID-positive but also had elevated troponin consistent with demand ischemia.  She was hospitalized in February with syncope and hypotension.  Cardiac evaluation was limited by her kidney disease and there were concerns about performing cardiac catheterization because of risk of AKI.  She ultimately was referred for a TEE for better assessment of her aortic valve disease.  This demonstrated partial fusion of the right and left coronary cusps with a V-max of 3.5, mean gradient of 28, dimensionless index of 0.23, and calculated aortic valve area of 0.73 cm.  All of these findings were felt to be consistent with severe low-flow low gradient aortic stenosis and the patient is referred now for further evaluation and discussion of treatment options.  She has also been found to have degenerative mitral valve disease with moderate mitral regurgitation.  She has been treated for chronic iron deficiency anemia without evidence of active bleeding.  [name] here with her family today. She is not able to ambulate over the past year because of ulcers on her amputation sites interacting with her prostheses. She  has recently gotten new prostheses and is doing a little better in this regard. She has had multiple hospitalizations for acute on chronic systolic heart failure in different hospital systems over the past year. She continues to have exertional dyspnea, but no chest pain, orthopnea, or PND. She denies lightheadedness or syncope. She is able to transfer on her own.   Past Medical History:  Diagnosis Date   Acute congestive heart failure 03/29/2018   Acute encephalopathy 09/02/2013   Anemia, normocytic normochromic 03/14/2008   Arthritis    "hands"   Atherosclerosis of native arteries of the extremities with ulceration 07/14/2013   Atherosclerosis of native artery of extremity with intermittent claudication 09/16/2011   CAD (coronary artery disease) of artery bypass graft 03/14/2008   Chronic kidney disease (CKD) 12/26/2015   stage 3 due to type 2 diabetes mellitus   Chronic pain syndrome 26/33/3545   Complication of anesthesia    pt felt like she had a hard time waking up after surgery in Feb. 2015.   Coronary artery disease involving native coronary artery without angina pectoris 09/25/2015   Essential hypertension 03/14/2008   Gastroesophageal reflux disease with esophagitis 07/09/2016   Generalized anxiety disorder with panic attacks    History of one panic attack   GERD (gastroesophageal reflux disease)    takes Zantac   Hyperlipidemia, unspecified 03/14/2008   Localization-related symptomatic epilepsy and epileptic syndromes with complex partial seizures, not intractable, without status epilepticus 06/27/2014   Major depressive disorder 03/14/2008   Neuropathy    Osteomyelitis of ankle or foot,  left, acute 06/17/2013   Peripheral vascular disease    PVD (peripheral vascular disease) 03/25/2012   Type 2 diabetes mellitus with diabetic mononeuropathy, with long-term current use of insulin 03/14/2008    Past Surgical History:  Procedure Laterality Date   AMPUTATION Left 08/22/2013    Procedure: LEFT BELOW KNEE AMPUTATION;  Surgeon: Newt Minion, MD;  Location: [facility];  Service: Orthopedics;  Laterality: Left;   ANKLE FUSION Left 05/11/2013   TIBIOCALCANEAL FUSION    ANKLE FUSION Left 05/12/2013   Procedure: LEFT TIBIOCALCANEAL FUSION;  Surgeon: Newt Minion, MD;  Location: [facility];  Service: Orthopedics;  Laterality: Left;  Left Tibiocalcaneal Fusion   APPLICATION OF WOUND VAC  06/17/2013   Procedure: APPLICATION OF WOUND VAC;  Surgeon: Newt Minion, MD;  Location: [facility];  Service: Orthopedics;;   BELOW KNEE LEG AMPUTATION Right Jan. 2008   BUBBLE STUDY  07/31/2021   Procedure: BUBBLE STUDY;  Surgeon: Geralynn Rile, MD;  Location: [facility];  Service: Cardiovascular;;   CAROTID ENDARTERECTOMY Right    CATARACT EXTRACTION Left 12/25/14   CHOLECYSTECTOMY     COLONOSCOPY     COLONOSCOPY N/A 04/24/2021   Procedure: COLONOSCOPY;  Surgeon: Toledo, Benay Pike, MD;  Location: [facility];  Service: Gastroenterology;  Laterality: N/A;   CORONARY ARTERY BYPASS GRAFT  12/01/2007   "CABG X4" (05/12/2013)   DILATION AND CURETTAGE OF UTERUS     I & D EXTREMITY Left 06/17/2013   Procedure: IRRIGATION AND DEBRIDEMENT EXTREMITY, PLACEMENT ANTIBIOTIC STIMULAN BEADS;  Surgeon: Newt Minion, MD;  Location: [facility];  Service: Orthopedics;  Laterality: Left;   INTRAMEDULLARY (IM) NAIL INTERTROCHANTERIC Left 08/20/2018   Procedure: INTRAMEDULLARY (IM) NAIL INTERTROCHANTRIC;  Surgeon: Shona Needles, MD;  Location: [facility];  Service: Orthopedics;  Laterality: Left;   IRRIGATION AND DEBRIDEMENT ABSCESS Left 06/17/2013   ANKLE           DR [name]   TEE WITHOUT CARDIOVERSION N/A 07/31/2021   Procedure: TRANSESOPHAGEAL ECHOCARDIOGRAM (TEE);  Surgeon: Geralynn Rile, MD;  Location: [facility];  Service: Cardiovascular;  Laterality: N/A;   TOE AMPUTATION Right    "took off a couple toes before the amputation"   TONSILLECTOMY      Current Medications: Current Meds  Medication Sig    acetaminophen-codeine (TYLENOL #3) 300-30 MG tablet Take 1 tablet by mouth every 6 (six) hours as needed for severe pain.   albuterol (VENTOLIN HFA) 108 (90 Base) MCG/ACT inhaler Inhale 1-2 puffs into the lungs every 6 (six) hours as needed for wheezing or shortness of breath.    ALPRAZolam (XANAX) 0.5 MG tablet Take 0.5 mg by mouth 2 (two) times daily as needed for anxiety.   aspirin 81 MG tablet Take 1 tablet (81 mg total) by mouth daily.   carvedilol (COREG) 6.25 MG tablet Take 1 tablet (6.25 mg total) by mouth 2 (two) times daily.   clopidogrel (PLAVIX) 75 MG tablet Take 1 tablet (75 mg total) by mouth daily.   cycloSPORINE (RESTASIS) 0.05 % ophthalmic emulsion Place 1 drop into both eyes 2 (two) times daily as needed (dry eyes).   diclofenac Sodium (VOLTAREN) 1 % GEL Apply 1 application. topically 4 (four) times daily as needed (pain).   empagliflozin (JARDIANCE) 10 MG TABS tablet Take 10 mg by mouth daily.   ezetimibe (ZETIA) 10 MG tablet Take 10 mg by mouth daily.   insulin lispro (HUMALOG) 100 UNIT/ML injection Inject 12 Units into the skin 2 (two) times  daily with a meal.   LANTUS SOLOSTAR 100 UNIT/ML Solostar Pen Inject 38 Units into the skin at bedtime.   Levetiracetam 750 MG TB24 Take 2 tablets (1,500 mg total) by mouth at bedtime.   lidocaine (LIDODERM) 5 % Place 1 patch onto the skin daily as needed (pain).   losartan (COZAAR) 50 MG tablet Take 50 mg by mouth daily.   Multiple Vitamins-Minerals (MULTIVITAMINS THER. W/MINERALS) TABS Take 1 tablet by mouth daily.     mupirocin ointment (BACTROBAN) 2 % Apply 1 Application topically 3 (three) times daily.   nitroGLYCERIN (NITROSTAT) 0.4 MG SL tablet Place 0.4 mg under the tongue every 5 (five) minutes as needed for chest pain.   pioglitazone (ACTOS) 15 MG tablet Take by mouth.   pregabalin (LYRICA) 50 MG capsule TAKE 1 CAPSULE BY MOUTH EVERY NIGHT   rosuvastatin (CRESTOR) 40 MG tablet Take 40 mg by mouth daily.   sertraline (ZOLOFT)  100 MG tablet Take 1 tablet (100 mg total) by mouth 2 (two) times daily.   torsemide (DEMADEX) 20 MG tablet Take 20 mg by mouth daily. TAKE EXTRA TABLET AS NEEDED     Allergies:   Metformin hcl, Ace inhibitors, Actos [pioglitazone], Atorvastatin, Candesartan cilexetil, Gabapentin, Influenza vaccines, Isosorbide nitrate, Keflex [cephalexin], Quinapril hcl, Tramadol hcl, and Tape   Social History   Socioeconomic History   Marital status: Married    Spouse name: Not on file   Number of children: 2   Years of education: 14   Highest education level: Associate degree: occupational, Hotel manager, or vocational program  Occupational History   Occupation: Disability  Tobacco Use   Smoking status: Never   Smokeless tobacco: Never  Vaping Use   Vaping Use: Never used  Substance and Sexual Activity   Alcohol use: No   Drug use: No   Sexual activity: Not Currently  Other Topics Concern   Not on file  Social History Narrative   Right handed    Lives in a one story home   Drinks caffeine    Social Determinants of Health   Financial Resource Strain: Not on file  Food Insecurity: Not on file  Transportation Needs: Not on file  Physical Activity: Not on file  Stress: Not on file  Social Connections: Not on file     Family History: The patient's family history includes Alzheimer's disease in her maternal grandfather; Cancer in her mother; Diabetes in her father; Heart disease in her father; Hyperlipidemia in her father; Hypertension in her father.  ROS:   Please see the history of present illness.    All other systems reviewed and are negative.  EKGs/Labs/Other Studies Reviewed:    The following studies were reviewed today: Echo  1. Left ventricular ejection fraction, by estimation, is 35 to 40%. The  left ventricle has moderately decreased function. Left ventricular  endocardial border not optimally defined to evaluate regional wall motion.  There is mild left ventricular   hypertrophy. Left ventricular diastolic parameters are indeterminate.   2. Right ventricular systolic function is normal. The right ventricular  size is normal. There is mildly elevated pulmonary artery systolic  pressure.   3. Left atrial size was moderately dilated.   4. The mitral valve is normal in structure. No evidence of mitral valve  regurgitation. Mild to moderate mitral stenosis. The mean mitral valve  gradient is 5.0 mmHg. Severe mitral annular calcification.   5. The aortic valve is calcified. Aortic valve regurgitation is not  visualized. Moderate to severe  aortic valve stenosis. Aortic valve mean  gradient measures 24.0 mmHg. Aortic valve is not well seen. Possible low  flow-low gradient severe aortic  stenosis.   FINDINGS   Left Ventricle: Left ventricular ejection fraction, by estimation, is 35  to 40%. The left ventricle has moderately decreased function. Left  ventricular endocardial border not optimally defined to evaluate regional  wall motion. Definity contrast agent  was given IV to delineate the left ventricular endocardial borders. The  left ventricular internal cavity size was normal in size. There is mild  left ventricular hypertrophy. Left ventricular diastolic parameters are  indeterminate.   Right Ventricle: The right ventricular size is normal. No increase in  right ventricular wall thickness. Right ventricular systolic function is  normal. There is mildly elevated pulmonary artery systolic pressure. The  tricuspid regurgitant velocity is 3.03   m/s, and with an assumed right atrial pressure of 5 mmHg, the estimated  right ventricular systolic pressure is 48.2 mmHg.   Left Atrium: Left atrial size was moderately dilated.   Right Atrium: Right atrial size was normal in size.   Pericardium: There is no evidence of pericardial effusion.   Mitral Valve: The mitral valve is normal in structure. Severe mitral  annular calcification. No evidence of  mitral valve regurgitation. Mild to  moderate mitral valve stenosis. MV peak gradient, 9.1 mmHg. The mean  mitral valve gradient is 5.0 mmHg.   Tricuspid Valve: The tricuspid valve is normal in structure. Tricuspid  valve regurgitation is mild . No evidence of tricuspid stenosis.   Aortic Valve: The aortic valve is calcified. Aortic valve regurgitation is  not visualized. Moderate to severe aortic stenosis is present. Aortic  valve mean gradient measures 24.0 mmHg. Aortic valve peak gradient  measures 37.9 mmHg. Aortic valve area, by  VTI measures 0.91 cm.   Pulmonic Valve: The pulmonic valve was normal in structure. Pulmonic valve  regurgitation is mild. No evidence of pulmonic stenosis.   Aorta: The aortic root is normal in size and structure.   Venous: The inferior vena cava was not well visualized.   IAS/Shunts: No atrial level shunt detected by color flow Doppler.      LEFT VENTRICLE  PLAX 2D  LVIDd:         3.49 cm   Diastology  LVIDs:         2.85 cm   LV e' medial:    3.59 cm/s  LV PW:         1.05 cm   LV E/e' medial:  43.7  LV IVS:        0.80 cm   LV e' lateral:   6.09 cm/s  LVOT diam:     2.00 cm   LV E/e' lateral: 25.8  LV SV:         47  LV SV Index:   24  LVOT Area:     3.14 cm      LEFT ATRIUM           Index  LA diam:      4.50 cm 2.33 cm/m  LA Vol (A4C): 67.7 ml 34.98 ml/m   AORTIC VALVE                     PULMONIC VALVE  AV Area (Vmax):    0.79 cm      PV Vmax:       1.05 m/s  AV Area (Vmean):   0.93 cm  PV Vmean:      69.500 cm/s  AV Area (VTI):     0.91 cm      PV VTI:        0.177 m  AV Vmax:           308.00 cm/s   PV Peak grad:  4.4 mmHg  AV Vmean:          183.200 cm/s  PV Mean grad:  2.0 mmHg  AV VTI:            0.516 m  AV Peak Grad:      37.9 mmHg  AV Mean Grad:      24.0 mmHg  LVOT Vmax:         77.70 cm/s  LVOT Vmean:        54.400 cm/s  LVOT VTI:          0.149 m  LVOT/AV VTI ratio: 0.29     AORTA  Ao Root diam: 2.30 cm    MITRAL VALVE                TRICUSPID VALVE  MV Area (PHT): 3.77 cm     TR Peak grad:   36.7 mmHg  MV Area VTI:   1.19 cm     TR Vmax:        303.00 cm/s  MV Peak grad:  9.1 mmHg  MV Mean grad:  5.0 mmHg     SHUNTS  MV Vmax:       1.51 m/s     Systemic VTI:  0.15 m  MV Vmean:      107.0 cm/s   Systemic Diam: 2.00 cm  MV Decel Time: 201 msec  MV E velocity: 157.00 cm/s  MV A velocity: 140.00 cm/s  MV E/A ratio:  1.12   TEE: IMPRESSIONS     1. Tricuspid AoV with severe calcifications. There is partial fusion of  the RCC/LCC which could be acquired. Vmax 3.52 m/s, MG 28 mmHG, AVA 0.73  cm2, DI 0.23. Aortic stenosis is severe. Direct 3D planimetry 0.87 cm2.  Overall, suspect low flow low  gradient AS. CW jet is rounded consistent with severe AS as well. The  aortic valve is tricuspid. There is severe calcifcation of the aortic  valve. There is severe thickening of the aortic valve. Aortic valve  regurgitation is not visualized.   2. Degnerative mitral valve disease. Moderate mitral regurgitation  secondary to restricted PMVL from calcifications (IIIB). 2D PISA radius  0.7 cm, ERO 0.23 cm2, R vol 39 cc, RF 38%. 3D VCA 0.24 cm2. PV flow is  systolic dominant or blunted in all veins.  Mild mitral stenosis is present. MG 6.0 mmHG @ 82 bpm. MVA by 3D  planimetry 1.97 cm2. The mitral valve is degenerative. Moderate mitral  valve regurgitation. Mild mitral stenosis. Moderate to severe mitral  annular calcification.   3. Left ventricular ejection fraction, by estimation, is 40 to 45%. The  left ventricle has mildly decreased function. The left ventricle  demonstrates global hypokinesis.   4. Right ventricular systolic function is normal. The right ventricular  size is mildly enlarged.   5. Left atrial size was mild to moderately dilated. No left atrial/left  atrial appendage thrombus was detected. The LAA emptying velocity was 54  cm/s.   6. Right atrial size was mildly dilated.    7. There is mild (Grade II) layered plaque involving the descending  aorta.   8. Agitated saline contrast  bubble study was negative, with no evidence  of any interatrial shunt.   EKG:  EKG is ordered today.  The ekg ordered today demonstrates NSR with LBBB, HR 87 bpm  Recent Labs: 02/22/2021: Magnesium 2.2 04/21/2021: ALT 29; B Natriuretic Peptide 583.7 07/25/2021: BUN 34; Creatinine, Ser 1.57; Hemoglobin 10.2; Platelets 202; Potassium 3.7; Sodium 137  Recent Lipid Panel    Component Value Date/Time   CHOL 113 08/30/2013 0550   TRIG 149 08/30/2013 0550   HDL 36 (L) 08/30/2013 0550   CHOLHDL 3.1 08/30/2013 0550   VLDL 30 08/30/2013 0550   LDLCALC 47 08/30/2013 0550     Risk Assessment/Calculations:           Physical Exam:    VS:  BP 130/60   Pulse 87   Ht '5\' 5"'$  (1.651 m)   Wt 185 lb 6.4 oz (84.1 kg)   SpO2 99%   BMI 30.85 kg/m     Wt Readings from Last 3 Encounters:  09/19/21 185 lb 6.4 oz (84.1 kg)  07/31/21 177 lb (80.3 kg)  07/02/21 176 lb (79.8 kg)     GEN:  Well nourished, well developed in no acute distress, in wheelchair HEENT: Normal NECK: No JVD; No carotid bruits LYMPHATICS: No lymphadenopathy CARDIAC: RRR, 3/6 harsh systolic murmur at the RUSB, A2 present but diminished RESPIRATORY:  Clear to auscultation without rales, wheezing or rhonchi  ABDOMEN: Soft, non-tender, non-distended MUSCULOSKELETAL:  BL BKA's with prosthetics in place SKIN: Warm and dry NEUROLOGIC:  Alert and oriented x 3 PSYCHIATRIC:  Normal affect   ASSESSMENT:    1. Pre-procedural cardiovascular examination   2. Nonrheumatic aortic valve stenosis    PLAN:    In order of problems listed above:  The patient has severe, stage D2, low flow low gradient aortic stenosis with reduced LVEF. Her echo is reviewed and shows a Vmax of 3.5 m/s, AVA 0.73, mean gradient 28 mmHg, and DI of 0.23. the valve leaflets are severely calcified and restricted. As outlined above, she has had multiple  CHF hospitalizations over the past year and has significant comorbidities of diabetes, bilateral leg amputations, LV dysfunction, prior CABG, and stage 3b CKD. I have reviewed the natural history of aortic stenosis with the patient and their family members who are present today. We have discussed the limitations of medical therapy and the poor prognosis associated with symptomatic aortic stenosis. We have reviewed potential treatment options, including palliative medical therapy, conventional surgical aortic valve replacement, and transcatheter aortic valve replacement. We discussed treatment options in the context of the patient's specific comorbid medical conditions.  TAVR is indicated to prevent further clinical deterioration, reduce risk of heart failure progression and hospitalization, and possibly reduce mortality. The patient will require further evaluation with cardiac catheterization to assess hemodynamics and graft patency. I have reviewed the risks, indications, and alternatives to cardiac catheterization, possible angioplasty, and stenting with the patient. Risks include but are not limited to bleeding, infection, vascular injury, stroke, myocardial infection, arrhythmia, kidney injury, radiation-related injury in the case of prolonged fluoroscopy use, emergency cardiac surgery, and death. The patient understands the risks of serious complication is 1-2 in 6295 with diagnostic cardiac cath and 1-2% or less with angioplasty/stenting.  Once her cath is completed, she will require CTA studies of the heart and the chest, abdomen, and pelvis to assess for anatomic suitability for TAVR. Once her studies are completed, she will be referred for formal cardiac surgical evaluation as part of a multidisciplinary approach to  her care.       Shared Decision Making/Informed Consent The risks [stroke (1 in 1000), death (1 in 1000), kidney failure [usually temporary] (1 in 500), bleeding (1 in 200), allergic  reaction [possibly serious] (1 in 200)], benefits (diagnostic support and management of coronary artery disease) and alternatives of a cardiac catheterization were discussed in detail with Ms. Tourigny and she is willing to proceed.    Medication Adjustments/Labs and Tests Ordered: Current medicines are reviewed at length with the patient today.  Concerns regarding medicines are outlined above.  Orders Placed This Encounter  Procedures   Basic metabolic panel   CBC   EKG 12-Lead   No orders of the defined types were placed in this encounter.   Patient Instructions  Medication Instructions:  Your physician recommends that you continue on your current medications as directed. Please refer to the Current Medication list given to you today.  *If you need a refill on your cardiac medications before your next appointment, please call your pharmacy*   Lab Work: CBC, BMET (within 7 days of cath) If you have labs (blood work) drawn today and your tests are completely normal, you will receive your results only by: [place] (if you have MyChart) OR A paper copy in the mail If you have any lab test that is abnormal or we need to change your treatment, we will call you to review the results.   Testing/Procedures: R & L heart cath Your physician has requested that you have a cardiac catheterization. Cardiac catheterization is used to diagnose and/or treat various heart conditions. Doctors may recommend this procedure for a number of different reasons. The most common reason is to evaluate chest pain. Chest pain can be a symptom of coronary artery disease (CAD), and cardiac catheterization can show whether plaque is narrowing or blocking your heart's arteries. This procedure is also used to evaluate the valves, as well as measure the blood flow and oxygen levels in different parts of your heart. For further information please visit [email or web address]. Please follow instruction sheet, as  given.  Follow-Up: At [facility], you and your health needs are our priority.  As part of our continuing mission to provide you with exceptional heart care, we have created designated Provider Care Teams.  These Care Teams include your primary Cardiologist (physician) and Advanced Practice Providers (APPs -  Physician Assistants and Nurse Practitioners) who all work together to provide you with the care you need, when you need it.  Your next appointment:   Structural Team Will Follow  Provider:   Sherren Mocha, MD :1}    Other Instructions  [facility] OFFICE [place], [place] [address] Dept: [phone] Loc: [place]  09/19/2021  You are scheduled for a Cardiac Catheterization on Monday, July 24 with Dr. Sherren Mocha.  1. Please arrive at the Main Entrance A at [facility]: [place], [place] [address] at 7:30 AM (This time is two hours before your procedure to ensure your preparation). Free valet parking service is available.   Special note: Every effort is made to have your procedure done on time. Please understand that emergencies sometimes delay scheduled procedures.  2. Diet: Do not eat solid foods after midnight.  You may have clear liquids until 5 AM upon the day of the procedure.  3. Labs: You will need to have blood drawn on Monday, July  17 at [facility] at [facility]. [address]  Open: 7:30am - 5pm    Phone: [phone]. You do not need to be fasting.  4. Medication instructions in preparation for your procedure:   Contrast Allergy: No  TAKE HALF DOSE OF LANTUS INSULIN NIGHT BEFORE CATH  DO NOT TAKE Losartan, Torsemide, Jardiance, or Pioglitazone day of CATH  On the morning of your procedure, take Aspirin '81MG'$ , Clopidogrel, and any morning medicines NOT listed above.  You may use sips of  water.  5. Plan to go home the same day, you will only stay overnight if medically necessary. 6. You MUST have a responsible adult to drive you home. 7. An adult MUST be with you the first 24 hours after you arrive home. 8. Bring a current list of your medications, and the last time and date medication taken. 9. Bring ID and current insurance cards. 10.Please wear clothes that are easy to get on and off and wear slip-on shoes.  Thank you for allowing [place] to care for you!   -- [facility]   Important Information About Sugar         Signed, Sherren Mocha, MD  09/21/2021 3:08 PM    [place]

## 2021-09-21 ENCOUNTER — Encounter: Payer: Self-pay | Admitting: Cardiovascular Disease

## 2021-09-25 DIAGNOSIS — N3000 Acute cystitis without hematuria: Secondary | ICD-10-CM | POA: Diagnosis not present

## 2021-09-25 DIAGNOSIS — E162 Hypoglycemia, unspecified: Secondary | ICD-10-CM | POA: Diagnosis not present

## 2021-09-25 DIAGNOSIS — R300 Dysuria: Secondary | ICD-10-CM | POA: Diagnosis not present

## 2021-09-27 DIAGNOSIS — E1142 Type 2 diabetes mellitus with diabetic polyneuropathy: Secondary | ICD-10-CM | POA: Diagnosis not present

## 2021-09-30 ENCOUNTER — Other Ambulatory Visit: Payer: Medicare Other

## 2021-09-30 DIAGNOSIS — Z01810 Encounter for preprocedural cardiovascular examination: Secondary | ICD-10-CM | POA: Diagnosis not present

## 2021-10-01 LAB — CBC
Hematocrit: 26.3 % — ABNORMAL LOW (ref 34.0–46.6)
Hemoglobin: 7.8 g/dL — ABNORMAL LOW (ref 11.1–15.9)
MCH: 25.1 pg — ABNORMAL LOW (ref 26.6–33.0)
MCHC: 29.7 g/dL — ABNORMAL LOW (ref 31.5–35.7)
MCV: 85 fL (ref 79–97)
Platelets: 184 10*3/uL (ref 150–450)
RBC: 3.11 x10E6/uL — ABNORMAL LOW (ref 3.77–5.28)
RDW: 15.9 % — ABNORMAL HIGH (ref 11.7–15.4)
WBC: 5.1 10*3/uL (ref 3.4–10.8)

## 2021-10-01 LAB — BASIC METABOLIC PANEL
BUN/Creatinine Ratio: 20 (ref 12–28)
BUN: 39 mg/dL — ABNORMAL HIGH (ref 8–27)
CO2: 25 mmol/L (ref 20–29)
Calcium: 8.2 mg/dL — ABNORMAL LOW (ref 8.7–10.3)
Chloride: 98 mmol/L (ref 96–106)
Creatinine, Ser: 1.93 mg/dL — ABNORMAL HIGH (ref 0.57–1.00)
Glucose: 235 mg/dL — ABNORMAL HIGH (ref 70–99)
Potassium: 4.5 mmol/L (ref 3.5–5.2)
Sodium: 140 mmol/L (ref 134–144)
eGFR: 29 mL/min/{1.73_m2} — ABNORMAL LOW (ref >59)

## 2021-10-02 ENCOUNTER — Telehealth: Payer: Self-pay | Admitting: *Deleted

## 2021-10-02 NOTE — Telephone Encounter (Addendum)
See 09/30/21 BMP/CBC results.  Copied from Staff Message Dr Burt Knack 10/01/21: "Agree with holding those meds day prior and day of cath. OK to proceed. I'll talk to her about her anemia while she's here. Need to stay on top of that."   Per Dr Burt Knack: -okay to proceed -agree with pre-procedure hydration -agree with holding losartan and torsemide day before and day of procedure -will talk with patient about anemia while she is there for cath

## 2021-10-02 NOTE — Telephone Encounter (Addendum)
Cardiac Catheterization scheduled at [facility] for: Monday October 07, 2021 12 Noon Arrival time and place: Portal Entrance A at: 7 AM-pre-procedure hydration   Nothing to eat after midnight prior to procedure, clear liquids until 5 AM day of procedure.  Medication instructions: -Hold:  Losartan/Torsemide-day before and day of procedure -per protocol GFR 29  Jardiance/Actos-AM of procedure  Insulin-AM of procedure/1/2 usual Insulin HS prior to procedure -Except hold medication usual morning medications can be taken with sips of water including aspirin 81 mg and Plavix 75 mg.  Confirmed patient has responsible adult to drive home post procedure and be with patient first 24 hours after arriving home.  I reviewed instructions with patient today, will plan to follow up with patient tomorrow and review instructions again when she has medications with her.

## 2021-10-03 DIAGNOSIS — G546 Phantom limb syndrome with pain: Secondary | ICD-10-CM | POA: Diagnosis not present

## 2021-10-03 DIAGNOSIS — E1142 Type 2 diabetes mellitus with diabetic polyneuropathy: Secondary | ICD-10-CM | POA: Diagnosis not present

## 2021-10-03 NOTE — Telephone Encounter (Signed)
Per Anderson Malta, RN Cath Lab-will allow son to be with patient during the time she is in Short Stay.

## 2021-10-03 NOTE — Telephone Encounter (Addendum)
Reviewed instructions and medications with patient and her son, Chance. Patient reports she has not had any signs of active bleeding.   Patient is bilateral amputee with mobility issues. Chance (pt's son), requests that he be allowed to stay with his mother to assist her as necessary during the time she is in Short Stay getting IV hydration prior to cath.  I will forward to [facility], RN Cath Lab

## 2021-10-07 ENCOUNTER — Ambulatory Visit (HOSPITAL_COMMUNITY)
Admission: RE | Admit: 2021-10-07 | Discharge: 2021-10-07 | Disposition: A | Discharge status: Home / Self Care | Payer: Medicare Other | Attending: Cardiovascular Disease | Admitting: Cardiovascular Disease

## 2021-10-07 ENCOUNTER — Other Ambulatory Visit: Payer: Self-pay

## 2021-10-07 ENCOUNTER — Encounter (HOSPITAL_COMMUNITY)
Admission: RE | Disposition: A | Discharge status: Home / Self Care | Payer: Self-pay | Source: Home / Self Care | Attending: Cardiovascular Disease

## 2021-10-07 DIAGNOSIS — Z7984 Long term (current) use of oral hypoglycemic drugs: Secondary | ICD-10-CM | POA: Insufficient documentation

## 2021-10-07 DIAGNOSIS — I350 Nonrheumatic aortic (valve) stenosis: Secondary | ICD-10-CM | POA: Diagnosis not present

## 2021-10-07 DIAGNOSIS — Z89512 Acquired absence of left leg below knee: Secondary | ICD-10-CM | POA: Diagnosis not present

## 2021-10-07 DIAGNOSIS — Z89511 Acquired absence of right leg below knee: Secondary | ICD-10-CM | POA: Insufficient documentation

## 2021-10-07 DIAGNOSIS — I25810 Atherosclerosis of coronary artery bypass graft(s) without angina pectoris: Secondary | ICD-10-CM

## 2021-10-07 DIAGNOSIS — N1832 Chronic kidney disease, stage 3b: Secondary | ICD-10-CM | POA: Diagnosis not present

## 2021-10-07 DIAGNOSIS — I2582 Chronic total occlusion of coronary artery: Secondary | ICD-10-CM | POA: Insufficient documentation

## 2021-10-07 DIAGNOSIS — E1140 Type 2 diabetes mellitus with diabetic neuropathy, unspecified: Secondary | ICD-10-CM | POA: Diagnosis not present

## 2021-10-07 DIAGNOSIS — Z951 Presence of aortocoronary bypass graft: Secondary | ICD-10-CM | POA: Insufficient documentation

## 2021-10-07 DIAGNOSIS — E1122 Type 2 diabetes mellitus with diabetic chronic kidney disease: Secondary | ICD-10-CM | POA: Insufficient documentation

## 2021-10-07 DIAGNOSIS — Z794 Long term (current) use of insulin: Secondary | ICD-10-CM | POA: Diagnosis not present

## 2021-10-07 DIAGNOSIS — I2510 Atherosclerotic heart disease of native coronary artery without angina pectoris: Secondary | ICD-10-CM | POA: Diagnosis not present

## 2021-10-07 DIAGNOSIS — D631 Anemia in chronic kidney disease: Secondary | ICD-10-CM | POA: Diagnosis not present

## 2021-10-07 HISTORY — PX: RIGHT/LEFT HEART CATH AND CORONARY/GRAFT ANGIOGRAPHY: CATH118267

## 2021-10-07 LAB — GLUCOSE, CAPILLARY
Glucose-Capillary: 198 mg/dL — ABNORMAL HIGH (ref 70–99)
Glucose-Capillary: 207 mg/dL — ABNORMAL HIGH (ref 70–99)

## 2021-10-07 LAB — POCT I-STAT EG7
Acid-Base Excess: 3 mmol/L — ABNORMAL HIGH (ref 0.0–2.0)
Bicarbonate: 29.6 mmol/L — ABNORMAL HIGH (ref 20.0–28.0)
Calcium, Ion: 1.18 mmol/L (ref 1.15–1.40)
HCT: 27 % — ABNORMAL LOW (ref 36.0–46.0)
Hemoglobin: 9.2 g/dL — ABNORMAL LOW (ref 12.0–15.0)
O2 Saturation: 69 %
Potassium: 3.9 mmol/L (ref 3.5–5.1)
Sodium: 141 mmol/L (ref 135–145)
TCO2: 31 mmol/L (ref 22–32)
pCO2, Ven: 52.9 mmHg (ref 44–60)
pH, Ven: 7.355 (ref 7.25–7.43)
pO2, Ven: 38 mmHg (ref 32–45)

## 2021-10-07 LAB — POCT I-STAT 7, (LYTES, BLD GAS, ICA,H+H)
Acid-Base Excess: 3 mmol/L — ABNORMAL HIGH (ref 0.0–2.0)
Bicarbonate: 29.2 mmol/L — ABNORMAL HIGH (ref 20.0–28.0)
Calcium, Ion: 1.18 mmol/L (ref 1.15–1.40)
HCT: 27 % — ABNORMAL LOW (ref 36.0–46.0)
Hemoglobin: 9.2 g/dL — ABNORMAL LOW (ref 12.0–15.0)
O2 Saturation: 97 %
Potassium: 3.9 mmol/L (ref 3.5–5.1)
Sodium: 140 mmol/L (ref 135–145)
TCO2: 31 mmol/L (ref 22–32)
pCO2 arterial: 49.8 mmHg — ABNORMAL HIGH (ref 32–48)
pH, Arterial: 7.377 (ref 7.35–7.45)
pO2, Arterial: 97 mmHg (ref 83–108)

## 2021-10-07 SURGERY — RIGHT/LEFT HEART CATH AND CORONARY/GRAFT ANGIOGRAPHY
Anesthesia: LOCAL

## 2021-10-07 MED ORDER — FENTANYL CITRATE (PF) 100 MCG/2ML IJ SOLN
INTRAMUSCULAR | Status: AC
  Filled 2021-10-07: qty 2

## 2021-10-07 MED ORDER — SODIUM CHLORIDE 0.9 % WEIGHT BASED INFUSION: 1.0000 mL/kg/h | INTRAVENOUS | Status: DC

## 2021-10-07 MED ORDER — SODIUM CHLORIDE 0.9% FLUSH: 3.0000 mL | Freq: Two times a day (BID) | INTRAVENOUS | Status: DC

## 2021-10-07 MED ORDER — SODIUM CHLORIDE 0.9 % WEIGHT BASED INFUSION
3.0000 mL/kg/h | INTRAVENOUS | Status: DC
  Administered 2021-10-07: 3 mL/kg/h via INTRAVENOUS

## 2021-10-07 MED ORDER — SODIUM CHLORIDE 0.9% FLUSH: 3.0000 mL | INTRAVENOUS | Status: DC | PRN

## 2021-10-07 MED ORDER — CLOPIDOGREL BISULFATE 75 MG PO TABS: 75.0000 mg | ORAL_TABLET | ORAL | Status: DC

## 2021-10-07 MED ORDER — LABETALOL HCL 5 MG/ML IV SOLN: 10.0000 mg | INTRAVENOUS | Status: DC | PRN

## 2021-10-07 MED ORDER — HEPARIN (PORCINE) IN NACL 1000-0.9 UT/500ML-% IV SOLN
INTRAVENOUS | Status: DC | PRN
  Administered 2021-10-07 (×2): 500 mL

## 2021-10-07 MED ORDER — ASPIRIN 81 MG PO CHEW: 81.0000 mg | CHEWABLE_TABLET | ORAL | Status: DC

## 2021-10-07 MED ORDER — LIDOCAINE HCL (PF) 1 % IJ SOLN
INTRAMUSCULAR | Status: AC
  Filled 2021-10-07: qty 30

## 2021-10-07 MED ORDER — ONDANSETRON HCL 4 MG/2ML IJ SOLN: 4.0000 mg | Freq: Four times a day (QID) | INTRAMUSCULAR | Status: DC | PRN

## 2021-10-07 MED ORDER — SODIUM CHLORIDE 0.9 % WEIGHT BASED INFUSION: 3.0000 mL/kg/h | INTRAVENOUS | Status: AC

## 2021-10-07 MED ORDER — LIDOCAINE HCL (PF) 1 % IJ SOLN
INTRAMUSCULAR | Status: DC | PRN
  Administered 2021-10-07: 5 mL via INTRADERMAL

## 2021-10-07 MED ORDER — FENTANYL CITRATE (PF) 100 MCG/2ML IJ SOLN
INTRAMUSCULAR | Status: DC | PRN
  Administered 2021-10-07 (×2): 25 ug via INTRAVENOUS

## 2021-10-07 MED ORDER — MIDAZOLAM HCL 2 MG/2ML IJ SOLN
INTRAMUSCULAR | Status: AC
  Filled 2021-10-07: qty 2

## 2021-10-07 MED ORDER — HYDRALAZINE HCL 20 MG/ML IJ SOLN: 10.0000 mg | INTRAMUSCULAR | Status: DC | PRN

## 2021-10-07 MED ORDER — IOHEXOL 350 MG/ML SOLN
INTRAVENOUS | Status: DC | PRN
  Administered 2021-10-07: 45 mL

## 2021-10-07 MED ORDER — MIDAZOLAM HCL 2 MG/2ML IJ SOLN
INTRAMUSCULAR | Status: DC | PRN
  Administered 2021-10-07 (×2): 1 mg via INTRAVENOUS

## 2021-10-07 MED ORDER — SODIUM CHLORIDE 0.9 % IV SOLN: 250.0000 mL | INTRAVENOUS | Status: DC | PRN

## 2021-10-07 MED ORDER — HEPARIN (PORCINE) IN NACL 1000-0.9 UT/500ML-% IV SOLN
INTRAVENOUS | Status: AC
  Filled 2021-10-07: qty 1000

## 2021-10-07 SURGICAL SUPPLY — 14 items
CATH EXPO 5F MPA-1 (CATHETERS) ×1 IMPLANT
CATH INFINITI 5FR MULTPACK ANG (CATHETERS) ×1 IMPLANT
CATH SWAN GANZ 7F STRAIGHT (CATHETERS) ×1 IMPLANT
ELECT DEFIB PAD ADLT CADENCE (PAD) ×1 IMPLANT
KIT HEART LEFT (KITS) ×2 IMPLANT
KIT MICROPUNCTURE NIT STIFF (SHEATH) ×1 IMPLANT
PACK CARDIAC CATHETERIZATION (CUSTOM PROCEDURE TRAY) ×2 IMPLANT
SHEATH PINNACLE 5F 10CM (SHEATH) ×1 IMPLANT
SHEATH PINNACLE 7F 10CM (SHEATH) ×1 IMPLANT
SHEATH PROBE COVER 6X72 (BAG) ×1 IMPLANT
SYR MEDRAD MARK 7 150ML (SYRINGE) ×2 IMPLANT
TRANSDUCER W/STOPCOCK (MISCELLANEOUS) ×2 IMPLANT
TUBING CIL FLEX 10 FLL-RA (TUBING) ×2 IMPLANT
WIRE EMERALD 3MM-J .035X150CM (WIRE) ×1 IMPLANT

## 2021-10-07 NOTE — Interval H&P Note (Signed)
History and Physical Interval Note:  10/07/2021 10:13 AM  [name]  has presented today for surgery, with the diagnosis of aortic stenosis.  The various methods of treatment have been discussed with the patient and family. After consideration of risks, benefits and other options for treatment, the patient has consented to  Procedure(s): RIGHT/LEFT HEART CATH AND CORONARY/GRAFT ANGIOGRAPHY (N/A) as a surgical intervention.  The patient's history has been reviewed, patient examined, no change in status, stable for surgery.  I have reviewed the patient's chart and labs.  Questions were answered to the patient's satisfaction.     Sherren Mocha

## 2021-10-07 NOTE — Progress Notes (Signed)
Site area: left groin arterial and venous sheaths pulled by Liliane Shi, RN Site Prior to Removal:  Level 0 Pressure Applied For: 20 minutes Manual:   yes Patient Status During Pull:  stable Post Pull Site:  Level 0 Post Pull Instructions Given:  yes Post Pull Pulses Present: left popliteal dopplered Dressing Applied:  gauze and tegaderm Bedrest begins @ 1683 Comments:

## 2021-10-08 ENCOUNTER — Encounter (HOSPITAL_COMMUNITY): Payer: Self-pay | Admitting: Cardiovascular Disease

## 2021-10-16 ENCOUNTER — Telehealth: Payer: Self-pay

## 2021-10-16 NOTE — Telephone Encounter (Signed)
The pt returned my call and at this time she is not interested in proceeding with TAVR evaluation.  The pt states that all of these tests are happening to quickly and she is feeling well.  The pt seems "scattered" in her thoughts on the phone and states that she has been asleep for the past 2 days and just woke up. Again, the pt confirmed that she is overall feeling well but is sleepy.  I asked the pt if she would like to come into the office again to discuss her aortic stenosis and how to proceed with treatment going forward. The pt was interested in seeing Dr Burt Knack in a few weeks, an appointment was scheduled on 8/23.

## 2021-10-16 NOTE — Telephone Encounter (Signed)
  [facility]   MULTIDISCIPLINARY HEART VALVE TEAM   The pt's case was discussed at 8/1 Multidisciplinary Heart Valve Team meeting and the team felt like the pt should proceed with TAVR CT scans.  The pt has a history of CKD and recently underwent cardiac cath on 7/24, GFR prior to cath was 29 . The pt will need to have a BMP drawn to assess her renal function prior to scheduling TAVR CT scans.  I have left the pt a message to contact the TAVR team to arrange lab work.

## 2021-10-23 ENCOUNTER — Ambulatory Visit: Payer: Medicare Other | Admitting: Cardiology

## 2021-10-28 DIAGNOSIS — E1142 Type 2 diabetes mellitus with diabetic polyneuropathy: Secondary | ICD-10-CM | POA: Diagnosis not present

## 2021-10-29 DIAGNOSIS — Z713 Dietary counseling and surveillance: Secondary | ICD-10-CM | POA: Diagnosis not present

## 2021-10-29 DIAGNOSIS — E1142 Type 2 diabetes mellitus with diabetic polyneuropathy: Secondary | ICD-10-CM | POA: Diagnosis not present

## 2021-11-06 ENCOUNTER — Ambulatory Visit (INDEPENDENT_AMBULATORY_CARE_PROVIDER_SITE_OTHER): Payer: Medicare Other | Admitting: Cardiovascular Disease

## 2021-11-06 ENCOUNTER — Encounter: Payer: Self-pay | Admitting: Cardiovascular Disease

## 2021-11-06 VITALS — BP 116/60 | HR 91 | Ht 65.0 in | Wt 169.0 lb

## 2021-11-06 DIAGNOSIS — I350 Nonrheumatic aortic (valve) stenosis: Secondary | ICD-10-CM | POA: Diagnosis not present

## 2021-11-06 DIAGNOSIS — Z01810 Encounter for preprocedural cardiovascular examination: Secondary | ICD-10-CM | POA: Diagnosis not present

## 2021-11-06 NOTE — Progress Notes (Signed)
Cardiology Office Note:    Date:  11/06/2021   ID:  [name], DOB 1956/01/23, MRN [id number]  PCP:  [name], [place] [facility] Cardiologist:  Minus Breeding, MD     Referring MD: [name], MD   Chief Complaint  Patient presents with   Aortic Stenosis    History of Present Illness:    [name] is a 66 y.o. female returning for follow-up evaluation.  She was initially seen September 19, 2021 for consideration of TAVR.  She has been followed for aortic stenosis and has been noted to have progressive aortic stenosis over the last year.  She has a history of coronary artery disease status post multivessel CABG in 2009, below-knee amputation of the right leg in 2008 and below-knee amputation of the left leg in 2015.  She also has type 2 diabetes, peripheral neuropathy, and stage IIIb chronic kidney disease.  She was found to have elevated troponin felt to be consistent with demand ischemia in December 2022 when she was hospitalized with respiratory failure during COVID infection.  She did not undergo cardiac evaluation at that time because of her kidney disease.  TEE was done which demonstrated partial fusion of the left and right cusps of the aortic valve with a peak velocity of 3.5 m/s, mean gradient 28 mmHg, and calculated aortic valve area of 0.73 cm.  She was also found to have degenerative mitral regurgitation felt to be moderate in severity.  The patient returns today with her family after undergoing right and left heart catheterization as she wanted to discuss TAVR again before proceeding with any further studies.  Her cardiac cath study showed multivessel CAD with total occlusion of the LAD, left circumflex, and right coronary arteries.  She had continued patency of the LIMA to LAD and free left radial graft to obtuse marginal.  Her vein graft to the right coronary artery and diagonal were occluded and there was extensive collateralization of  the RCA branches from left to right collaterals.  She had a mean transaortic gradient of 25 mmHg, peak to peak gradient 31 mmHg, and calculated aortic valve area of 1.4 cm.  She reports no recent chest pain or shortness of breath.  She remains an active but gets around some with a walker.  She has no orthopnea, PND, lightheadedness, or syncope.  Past Medical History:  Diagnosis Date   Acute congestive heart failure 03/29/2018   Acute encephalopathy 09/02/2013   Anemia, normocytic normochromic 03/14/2008   Arthritis    "hands"   Atherosclerosis of native arteries of the extremities with ulceration 07/14/2013   Atherosclerosis of native artery of extremity with intermittent claudication 09/16/2011   CAD (coronary artery disease) of artery bypass graft 03/14/2008   Chronic kidney disease (CKD) 12/26/2015   stage 3 due to type 2 diabetes mellitus   Chronic pain syndrome 75/91/6384   Complication of anesthesia    pt felt like she had a hard time waking up after surgery in Feb. 2015.   Coronary artery disease involving native coronary artery without angina pectoris 09/25/2015   Essential hypertension 03/14/2008   Gastroesophageal reflux disease with esophagitis 07/09/2016   Generalized anxiety disorder with panic attacks    History of one panic attack   GERD (gastroesophageal reflux disease)    takes Zantac   Hyperlipidemia, unspecified 03/14/2008   Localization-related symptomatic epilepsy and epileptic syndromes with complex partial seizures, not intractable, without status epilepticus 06/27/2014   Major  depressive disorder 03/14/2008   Neuropathy    Osteomyelitis of ankle or foot, left, acute 06/17/2013   Peripheral vascular disease    PVD (peripheral vascular disease) 03/25/2012   Type 2 diabetes mellitus with diabetic mononeuropathy, with long-term current use of insulin 03/14/2008    Past Surgical History:  Procedure Laterality Date   AMPUTATION Left 08/22/2013   Procedure: LEFT  BELOW KNEE AMPUTATION;  Surgeon: Newt Minion, MD;  Location: [place];  Service: Orthopedics;  Laterality: Left;   ANKLE FUSION Left 05/11/2013   TIBIOCALCANEAL FUSION    ANKLE FUSION Left 05/12/2013   Procedure: LEFT TIBIOCALCANEAL FUSION;  Surgeon: Newt Minion, MD;  Location: [place];  Service: Orthopedics;  Laterality: Left;  Left Tibiocalcaneal Fusion   APPLICATION OF WOUND VAC  06/17/2013   Procedure: APPLICATION OF WOUND VAC;  Surgeon: Newt Minion, MD;  Location: [place];  Service: Orthopedics;;   BELOW KNEE LEG AMPUTATION Right Jan. 2008   BUBBLE STUDY  07/31/2021   Procedure: BUBBLE STUDY;  Surgeon: Geralynn Rile, MD;  Location: [place];  Service: Cardiovascular;;   CAROTID ENDARTERECTOMY Right    CATARACT EXTRACTION Left 12/25/14   CHOLECYSTECTOMY     COLONOSCOPY     COLONOSCOPY N/A 04/24/2021   Procedure: COLONOSCOPY;  Surgeon: Toledo, Benay Pike, MD;  Location: [facility];  Service: Gastroenterology;  Laterality: N/A;   CORONARY ARTERY BYPASS GRAFT  12/01/2007   "CABG X4" (05/12/2013)   DILATION AND CURETTAGE OF UTERUS     I & D EXTREMITY Left 06/17/2013   Procedure: IRRIGATION AND DEBRIDEMENT EXTREMITY, PLACEMENT ANTIBIOTIC STIMULAN BEADS;  Surgeon: Newt Minion, MD;  Location: [place];  Service: Orthopedics;  Laterality: Left;   INTRAMEDULLARY (IM) NAIL INTERTROCHANTERIC Left 08/20/2018   Procedure: INTRAMEDULLARY (IM) NAIL INTERTROCHANTRIC;  Surgeon: Shona Needles, MD;  Location: [place];  Service: Orthopedics;  Laterality: Left;   IRRIGATION AND DEBRIDEMENT ABSCESS Left 06/17/2013   ANKLE           DR [name]   RIGHT/LEFT HEART CATH AND CORONARY/GRAFT ANGIOGRAPHY N/A 10/07/2021   Procedure: RIGHT/LEFT HEART CATH AND CORONARY/GRAFT ANGIOGRAPHY;  Surgeon: Sherren Mocha, MD;  Location: [place] [facility];  Service: Cardiovascular;  Laterality: N/A;   TEE WITHOUT CARDIOVERSION N/A 07/31/2021   Procedure: TRANSESOPHAGEAL ECHOCARDIOGRAM (TEE);  Surgeon: Geralynn Rile, MD;   Location: [place];  Service: Cardiovascular;  Laterality: N/A;   TOE AMPUTATION Right    "took off a couple toes before the amputation"   TONSILLECTOMY      Current Medications: Current Meds  Medication Sig   acetaminophen-codeine (TYLENOL #3) 300-30 MG tablet Take 1 tablet by mouth every 6 (six) hours as needed for severe pain.   albuterol (VENTOLIN HFA) 108 (90 Base) MCG/ACT inhaler Inhale 1-2 puffs into the lungs every 6 (six) hours as needed for wheezing or shortness of breath.    ALPRAZolam (XANAX) 0.5 MG tablet Take 0.5 mg by mouth 2 (two) times daily as needed for anxiety.   aspirin 81 MG tablet Take 1 tablet (81 mg total) by mouth daily.   carvedilol (COREG) 6.25 MG tablet Take 1 tablet (6.25 mg total) by mouth 2 (two) times daily.   clopidogrel (PLAVIX) 75 MG tablet Take 1 tablet (75 mg total) by mouth daily.   cycloSPORINE (RESTASIS) 0.05 % ophthalmic emulsion Place 1 drop into both eyes 2 (two) times daily as needed (dry eyes).   diclofenac Sodium (VOLTAREN) 1 % GEL Apply 1 application. topically 4 (  four) times daily as needed (pain).   ezetimibe (ZETIA) 10 MG tablet Take 10 mg by mouth daily.   HUMALOG KWIKPEN 100 UNIT/ML KwikPen Inject 12 Units into the skin 2 (two) times daily with a meal.   JARDIANCE 25 MG TABS tablet Take 25 mg by mouth daily.   LANTUS SOLOSTAR 100 UNIT/ML Solostar Pen Inject 38 Units into the skin at bedtime.   Levetiracetam 750 MG TB24 Take 2 tablets (1,500 mg total) by mouth at bedtime.   lidocaine (LIDODERM) 5 % Place 1 patch onto the skin daily as needed (pain).   losartan (COZAAR) 50 MG tablet Take 50 mg by mouth daily.   Multiple Vitamins-Minerals (MULTIVITAMINS THER. W/MINERALS) TABS Take 1 tablet by mouth daily.     mupirocin ointment (BACTROBAN) 2 % Apply 1 Application topically 3 (three) times daily.   nitroGLYCERIN (NITROSTAT) 0.4 MG SL tablet Place 0.4 mg under the tongue every 5 (five) minutes as needed for chest pain.   pioglitazone  (ACTOS) 15 MG tablet Take 15 mg by mouth daily.   pregabalin (LYRICA) 50 MG capsule TAKE 1 CAPSULE BY MOUTH EVERY NIGHT   rosuvastatin (CRESTOR) 40 MG tablet Take 40 mg by mouth daily.   sertraline (ZOLOFT) 100 MG tablet Take 1 tablet (100 mg total) by mouth 2 (two) times daily.   torsemide (DEMADEX) 20 MG tablet Take 20 mg by mouth daily. TAKE EXTRA TABLET AS NEEDED     Allergies:   Metformin hcl, Ace inhibitors, Actos [pioglitazone], Atorvastatin, Candesartan cilexetil, Gabapentin, Influenza vaccines, Isosorbide nitrate, Keflex [cephalexin], Quinapril hcl, Tramadol hcl, and Tape   Social History   Socioeconomic History   Marital status: Married    Spouse name: Not on file   Number of children: 2   Years of education: 14   Highest education level: Associate degree: occupational, Hotel manager, or vocational program  Occupational History   Occupation: Disability  Tobacco Use   Smoking status: Never   Smokeless tobacco: Never  Vaping Use   Vaping Use: Never used  Substance and Sexual Activity   Alcohol use: No   Drug use: No   Sexual activity: Not Currently  Other Topics Concern   Not on file  Social History Narrative   Right handed    Lives in a one story home   Drinks caffeine    Social Determinants of Health   Financial Resource Strain: Not on file  Food Insecurity: Not on file  Transportation Needs: Not on file  Physical Activity: Not on file  Stress: Not on file  Social Connections: Not on file     Family History: The patient's family history includes Alzheimer's disease in her maternal grandfather; Cancer in her mother; Diabetes in her father; Heart disease in her father; Hyperlipidemia in her father; Hypertension in her father.  ROS:   Please see the history of present illness.    All other systems reviewed and are negative.  EKGs/Labs/Other Studies Reviewed:    The following studies were reviewed today: Cardiac Cath: 1.  Severe native three-vessel coronary  artery disease with total occlusion of the LAD, left circumflex, and right coronary arteries 2.  Status post CABG with continued patency of the LIMA to LAD and free left radial graft OM.  Total occlusion of the SVG to RCA and SVG to diagonal. 3.  Extensive collateralization of the distal RCA branches from left to right collaterals 4.  Moderate aortic stenosis with mean gradient 25 mmHg, peak to peak gradient 31 mmHg, calculated  aortic valve area 1.4 cm  Recommendations: We will review her case with the multidisciplinary heart valve team.  Her aortic stenosis has appeared to be severe, low-flow low gradient paradoxical AAS (stage D3) by echo assessment.  With her multiple hospitalizations and comorbid conditions, should consider continuing on with TAVR evaluation.  Vascular access may be an issue based on today's evaluation.  TEE 07/31/21:  1. Tricuspid AoV with severe calcifications. There is partial fusion of  the RCC/LCC which could be acquired. Vmax 3.52 m/s, MG 28 mmHG, AVA 0.73  cm2, DI 0.23. Aortic stenosis is severe. Direct 3D planimetry 0.87 cm2.  Overall, suspect low flow low  gradient AS. CW jet is rounded consistent with severe AS as well. The  aortic valve is tricuspid. There is severe calcifcation of the aortic  valve. There is severe thickening of the aortic valve. Aortic valve  regurgitation is not visualized.   2. Degnerative mitral valve disease. Moderate mitral regurgitation  secondary to restricted PMVL from calcifications (IIIB). 2D PISA radius  0.7 cm, ERO 0.23 cm2, R vol 39 cc, RF 38%. 3D VCA 0.24 cm2. PV flow is  systolic dominant or blunted in all veins.  Mild mitral stenosis is present. MG 6.0 mmHG @ 82 bpm. MVA by 3D  planimetry 1.97 cm2. The mitral valve is degenerative. Moderate mitral  valve regurgitation. Mild mitral stenosis. Moderate to severe mitral  annular calcification.   3. Left ventricular ejection fraction, by estimation, is 40 to 45%. The  left  ventricle has mildly decreased function. The left ventricle  demonstrates global hypokinesis.   4. Right ventricular systolic function is normal. The right ventricular  size is mildly enlarged.   5. Left atrial size was mild to moderately dilated. No left atrial/left  atrial appendage thrombus was detected. The LAA emptying velocity was 54  cm/s.   6. Right atrial size was mildly dilated.   7. There is mild (Grade II) layered plaque involving the descending  aorta.   8. Agitated saline contrast bubble study was negative, with no evidence  of any interatrial shunt.   FINDINGS   Left Ventricle: Left ventricular ejection fraction, by estimation, is 40  to 45%. The left ventricle has mildly decreased function. The left  ventricle demonstrates global hypokinesis. The left ventricular internal  cavity size was normal in size.   Right Ventricle: The right ventricular size is mildly enlarged. No  increase in right ventricular wall thickness. Right ventricular systolic  function is normal.   Left Atrium: Left atrial size was mild to moderately dilated. No left  atrial/left atrial appendage thrombus was detected. The LAA emptying  velocity was 54 cm/s.   Right Atrium: Right atrial size was mildly dilated.   Pericardium: There is no evidence of pericardial effusion.   Mitral Valve: Degnerative mitral valve disease. Moderate mitral  regurgitation secondary to restricted PMVL from calcifications (IIIB). 2D  PISA radius 0.7 cm, ERO 0.23 cm2, R vol 39 cc, RF 38%. 3D VCA 0.24 cm2. PV  flow is systolic dominant or blunted in  all veins. Mild mitral stenosis is present. MG 6.0 mmHG @ 82 bpm. MVA by  3D planimetry 1.97 cm2. The mitral valve is degenerative in appearance.  Moderate to severe mitral annular calcification. Moderate mitral valve  regurgitation. Mild mitral valve  stenosis. MV peak gradient, 9.4 mmHg. The mean mitral valve gradient is  6.0 mmHg with average heart rate of 82 bpm.    Tricuspid Valve: The tricuspid valve is grossly normal.  Tricuspid valve  regurgitation is mild . No evidence of tricuspid stenosis.   Aortic Valve: Tricuspid AoV with severe calcifications. There is partial  fusion of the RCC/LCC which could be acquired. Vmax 3.52 m/s, MG 28 mmHG,  AVA 0.73 cm2, DI 0.23. Aortic stenosis is severe. Direct 3D planimetry  0.87 cm2. Overall, suspect low flow  low gradient AS. CW jet is rounded consistent with severe AS as well. The  aortic valve is tricuspid. There is severe calcifcation of the aortic  valve. There is severe thickening of the aortic valve. Aortic valve  regurgitation is not visualized. Aortic  valve mean gradient measures 28.3 mmHg. Aortic valve peak gradient  measures 49.7 mmHg. Aortic valve area, by VTI measures 0.73 cm.   Pulmonic Valve: The pulmonic valve was grossly normal. Pulmonic valve  regurgitation is not visualized. No evidence of pulmonic stenosis.   Aorta: The aortic root and ascending aorta are structurally normal, with  no evidence of dilitation. There is mild (Grade II) layered plaque  involving the descending aorta.   Venous: The left upper pulmonary vein, left lower pulmonary vein, right  lower pulmonary vein and right upper pulmonary vein are normal.   IAS/Shunts: The atrial septum is grossly normal. Agitated saline contrast  was given intravenously to evaluate for intracardiac shunting. Agitated  saline contrast bubble study was negative, with no evidence of any  interatrial shunt.      LEFT VENTRICLE  PLAX 2D  LVOT diam:     2.00 cm  LV SV:         63  LV SV Index:   34  LVOT Area:     3.14 cm      AORTIC VALVE  AV Area (Vmax):    0.84 cm  AV Area (Vmean):   0.83 cm  AV Area (VTI):     0.73 cm  AV Vmax:           352.36 cm/s  AV Vmean:          236.435 cm/s  AV VTI:            0.867 m  AV Peak Grad:      49.7 mmHg  AV Mean Grad:      28.3 mmHg  LVOT Vmax:         94.10 cm/s  LVOT Vmean:         62.100 cm/s  LVOT VTI:          0.202 m  LVOT/AV VTI ratio: 0.23     AORTA  Ao Root diam: 2.41 cm  Ao Asc diam:  3.00 cm   MITRAL VALVE  MV Peak grad: 9.4 mmHg        SHUNTS  MV Mean grad: 6.0 mmHg        Systemic VTI:  0.20 m  MV Vmax:      1.53 m/s        Systemic Diam: 2.00 cm  MV Vmean:     123.0 cm/s  MR Peak grad:    117.1 mmHg  MR Mean grad:    73.0 mmHg  MR Vmax:         541.00 cm/s  MR Vmean:        393.0 cm/s  MR PISA:         3.08 cm  MR PISA Eff ROA: 23 mm  MR PISA Radius:  0.70 cm   Echo 04/22/21:  1. Left ventricular ejection fraction, by estimation, is  35 to 40%. The  left ventricle has moderately decreased function. Left ventricular  endocardial border not optimally defined to evaluate regional wall motion.  There is mild left ventricular  hypertrophy. Left ventricular diastolic parameters are indeterminate.   2. Right ventricular systolic function is normal. The right ventricular  size is normal. There is mildly elevated pulmonary artery systolic  pressure.   3. Left atrial size was moderately dilated.   4. The mitral valve is normal in structure. No evidence of mitral valve  regurgitation. Mild to moderate mitral stenosis. The mean mitral valve  gradient is 5.0 mmHg. Severe mitral annular calcification.   5. The aortic valve is calcified. Aortic valve regurgitation is not  visualized. Moderate to severe aortic valve stenosis. Aortic valve mean  gradient measures 24.0 mmHg. Aortic valve is not well seen. Possible low  flow-low gradient severe aortic  stenosis.   EKG:  EKG is not ordered today.  Recent Labs: 02/22/2021: Magnesium 2.2 04/21/2021: ALT 29; B Natriuretic Peptide 583.7 09/30/2021: BUN 39; Creatinine, Ser 1.93; Platelets 184 10/07/2021: Hemoglobin 9.2; Potassium 3.9; Sodium 140  Recent Lipid Panel    Component Value Date/Time   CHOL 113 08/30/2013 0550   TRIG 149 08/30/2013 0550   HDL 36 (L) 08/30/2013 0550   CHOLHDL 3.1 08/30/2013 0550    VLDL 30 08/30/2013 0550   LDLCALC 47 08/30/2013 0550     Risk Assessment/Calculations:                Physical Exam:    VS:  BP 116/60   Pulse 91   Ht '5\' 5"'$  (1.651 m)   Wt 169 lb (76.7 kg)   SpO2 97%   BMI 28.12 kg/m     Wt Readings from Last 3 Encounters:  11/06/21 169 lb (76.7 kg)  10/07/21 170 lb (77.1 kg)  09/19/21 185 lb 6.4 oz (84.1 kg)     GEN:  Well nourished, well developed in no acute distress HEENT: Normal NECK: No JVD; No carotid bruits LYMPHATICS: No lymphadenopathy CARDIAC: RRR, 3/6 harsh crescendo decrescendo murmur at the right upper sternal border RESPIRATORY:  Clear to auscultation without rales, wheezing or rhonchi  ABDOMEN: Soft, non-tender, non-distended MUSCULOSKELETAL: Bilateral BKA with prostheses in place SKIN: Warm and dry NEUROLOGIC:  Alert and oriented x 3 PSYCHIATRIC:  Normal affect   ASSESSMENT:    1. Pre-procedural cardiovascular examination   2. Nonrheumatic aortic valve stenosis    PLAN:    In order of problems listed above:  The patient will undergo CT angiography studies to evaluate both her peripheral access and cardiac anatomy for TAVR.  We will update her labs and assess her creatinine again.  We will likely advise her to hold torsemide and losartan 24 to 48 hours before her scans are done. Lengthy discussion today regarding the natural history of aortic stenosis, management options in light of her comorbid medical conditions, and whether to move forward now versus close surveillance by serial exam and imaging.  Through a shared decision-making conversation, the patient would like to proceed with TAVR evaluation.  I think this is reasonable and that her echocardiogram and TEE are both suggestive of low-flow low gradient aortic stenosis.  Her TEE study shows a peak transaortic velocity of 3.5 m/s, peak and mean gradients of 50 and 28 mmHg, respectively, and aortic valve area of 0.8 cm, and a dimensionless index of 0.23.  I think  with delay in treatment, she will be at higher risk of worsening kidney function  and this will ultimately place her at higher risk when she will inevitably need aortic valve replacement in the relatively near future.  After her CTA studies are completed, she will be referred for cardiac surgical evaluation as part of a multidisciplinary approach to her care.           Medication Adjustments/Labs and Tests Ordered: Current medicines are reviewed at length with the patient today.  Concerns regarding medicines are outlined above.  Orders Placed This Encounter  Procedures   Basic metabolic panel   No orders of the defined types were placed in this encounter.   Patient Instructions  Medication Instructions:  Your physician recommends that you continue on your current medications as directed. Please refer to the Current Medication list given to you today. **HOLD Losartan AND Torsemide 2 days prior to CT scans**  Lab Work: BMET today If you have labs (blood work) drawn today and your tests are completely normal, you will receive your results only by: [employer] (if you have MyChart) OR A paper copy in the mail If you have any lab test that is abnormal or we need to change your treatment, we will call you to review the results.   Testing/Procedures: Ambulatory referral to TCTS (Dr Cyndia Bent or Reinaldo Berber)  TAVR CT scans   Follow-Up: At [facility], you and your health needs are our priority.  As part of our continuing mission to provide you with exceptional heart care, we have created designated Provider Care Teams.  These Care Teams include your primary Cardiologist (physician) and Advanced Practice Providers (APPs -  Physician Assistants and Nurse Practitioners) who all work together to provide you with the care you need, when you need it.  We recommend signing up for the patient portal called "MyChart".  Sign up information is provided on this After Visit Summary.  MyChart is used  to connect with patients for Virtual Visits (Telemedicine).  Patients are able to view lab/test results, encounter notes, upcoming appointments, etc.  Non-urgent messages can be sent to your provider as well.   To learn more about what you can do with MyChart, go to [email or web address].    Your next appointment:   Structural Team will follow-up  The format for your next appointment:   In Person  Provider:   Sherren Mocha, MD, Dalia Heading, Kathyrn Drown, NP  Important Information About Sugar         Signed, Sherren Mocha, MD  11/06/2021 5:43 PM    [place]

## 2021-11-06 NOTE — Patient Instructions (Signed)
Medication Instructions:  Your physician recommends that you continue on your current medications as directed. Please refer to the Current Medication list given to you today. **HOLD Losartan AND Torsemide 2 days prior to CT scans**  Lab Work: BMET today If you have labs (blood work) drawn today and your tests are completely normal, you will receive your results only by: [employer] (if you have MyChart) OR A paper copy in the mail If you have any lab test that is abnormal or we need to change your treatment, we will call you to review the results.   Testing/Procedures: Ambulatory referral to TCTS (Dr Cyndia Bent or Reinaldo Berber)  TAVR CT scans   Follow-Up: At [facility], you and your health needs are our priority.  As part of our continuing mission to provide you with exceptional heart care, we have created designated Provider Care Teams.  These Care Teams include your primary Cardiologist (physician) and Advanced Practice Providers (APPs -  Physician Assistants and Nurse Practitioners) who all work together to provide you with the care you need, when you need it.  We recommend signing up for the patient portal called "MyChart".  Sign up information is provided on this After Visit Summary.  MyChart is used to connect with patients for Virtual Visits (Telemedicine).  Patients are able to view lab/test results, encounter notes, upcoming appointments, etc.  Non-urgent messages can be sent to your provider as well.   To learn more about what you can do with MyChart, go to [email or web address].    Your next appointment:   Structural Team will follow-up  The format for your next appointment:   In Person  Provider:   Sherren Mocha, MD, Dalia Heading, Kathyrn Drown, NP  Important Information About Sugar

## 2021-11-07 ENCOUNTER — Ambulatory Visit: Payer: Medicare Other | Admitting: Cardiology

## 2021-11-07 ENCOUNTER — Other Ambulatory Visit: Payer: Self-pay

## 2021-11-07 DIAGNOSIS — I350 Nonrheumatic aortic (valve) stenosis: Secondary | ICD-10-CM

## 2021-11-07 LAB — BASIC METABOLIC PANEL
BUN/Creatinine Ratio: 22 (ref 12–28)
BUN: 40 mg/dL — ABNORMAL HIGH (ref 8–27)
CO2: 19 mmol/L — ABNORMAL LOW (ref 20–29)
Calcium: 7.6 mg/dL — ABNORMAL LOW (ref 8.7–10.3)
Chloride: 91 mmol/L — ABNORMAL LOW (ref 96–106)
Creatinine, Ser: 1.86 mg/dL — ABNORMAL HIGH (ref 0.57–1.00)
Glucose: 451 mg/dL — ABNORMAL HIGH (ref 70–99)
Potassium: 4.3 mmol/L (ref 3.5–5.2)
Sodium: 129 mmol/L — ABNORMAL LOW (ref 134–144)
eGFR: 30 mL/min/{1.73_m2} — ABNORMAL LOW (ref >59)

## 2021-11-08 ENCOUNTER — Telehealth: Payer: Self-pay | Admitting: Physician Assistant

## 2021-11-08 ENCOUNTER — Other Ambulatory Visit: Payer: Self-pay

## 2021-11-08 DIAGNOSIS — I350 Nonrheumatic aortic (valve) stenosis: Secondary | ICD-10-CM

## 2021-11-08 DIAGNOSIS — N1832 Chronic kidney disease, stage 3b: Secondary | ICD-10-CM

## 2021-11-08 NOTE — Telephone Encounter (Signed)
  [facility]   MULTIDISCIPLINARY HEART VALVE TEAM   I personally spoke to this patient's nephrologist, Dr. Joelyn Oms, who provided consent to proceed with pre TAVR CT scans given CKD with a GFR of 30. We will hold the appropriate medications and hydrate before and after.   Angelena Form PA-C  [facility]

## 2021-11-15 DIAGNOSIS — R300 Dysuria: Secondary | ICD-10-CM | POA: Diagnosis not present

## 2021-11-15 DIAGNOSIS — R195 Other fecal abnormalities: Secondary | ICD-10-CM | POA: Diagnosis not present

## 2021-11-15 DIAGNOSIS — R197 Diarrhea, unspecified: Secondary | ICD-10-CM | POA: Diagnosis not present

## 2021-11-21 ENCOUNTER — Other Ambulatory Visit (HOSPITAL_COMMUNITY): Payer: Self-pay | Admitting: *Deleted

## 2021-11-21 ENCOUNTER — Encounter: Payer: Self-pay | Admitting: Physician Assistant

## 2021-11-21 NOTE — Progress Notes (Signed)
Procedure Type: Isolated AVR Perioperative Outcome Estimate % Operative Mortality 7.74% Morbidity & Mortality 21.5% Stroke 1.87% Renal Failure 6.04% Reoperation 3.96% Prolonged Ventilation 18% Deep Sternal Wound Infection 0.28% [facility] Stay (>14 days) 8.85% [facility] Stay (<6 days)* 25.9%

## 2021-11-22 ENCOUNTER — Encounter (HOSPITAL_COMMUNITY)
Admission: RE | Admit: 2021-11-22 | Discharge: 2021-11-22 | Disposition: A | Discharge status: Home / Self Care | Payer: Medicare Other | Source: Ambulatory Visit | Attending: Cardiovascular Disease | Admitting: Cardiovascular Disease

## 2021-11-22 ENCOUNTER — Ambulatory Visit (HOSPITAL_COMMUNITY)
Admission: RE | Admit: 2021-11-22 | Discharge: 2021-11-22 | Disposition: A | Discharge status: Home / Self Care | Payer: Medicare Other | Source: Ambulatory Visit | Attending: Cardiovascular Disease | Admitting: Cardiovascular Disease

## 2021-11-22 DIAGNOSIS — N1832 Chronic kidney disease, stage 3b: Secondary | ICD-10-CM | POA: Diagnosis not present

## 2021-11-22 DIAGNOSIS — I350 Nonrheumatic aortic (valve) stenosis: Secondary | ICD-10-CM | POA: Diagnosis not present

## 2021-11-22 DIAGNOSIS — Z9049 Acquired absence of other specified parts of digestive tract: Secondary | ICD-10-CM | POA: Diagnosis not present

## 2021-11-22 DIAGNOSIS — J811 Chronic pulmonary edema: Secondary | ICD-10-CM | POA: Diagnosis not present

## 2021-11-22 MED ORDER — SODIUM CHLORIDE 0.9 % WEIGHT BASED INFUSION: 1.0000 mL/kg/h | INTRAVENOUS | Status: DC

## 2021-11-22 MED ORDER — SODIUM CHLORIDE 0.9 % WEIGHT BASED INFUSION
3.0000 mL/kg/h | INTRAVENOUS | Status: AC
  Administered 2021-11-22: 3 mL/kg/h via INTRAVENOUS

## 2021-11-22 MED ORDER — IOHEXOL 350 MG/ML SOLN
145.0000 mL | Freq: Once | INTRAVENOUS | Status: AC | PRN
  Administered 2021-11-22: 145 mL via INTRAVENOUS

## 2021-11-26 ENCOUNTER — Telehealth: Payer: Self-pay | Admitting: Physician Assistant

## 2021-11-26 NOTE — Telephone Encounter (Signed)
  [facility]  Patient called into office to report bloody stool, diarrhea and abdominal distention since her TAVR CT scans 4 days ago. She thinks symptoms are related to her CT scans. She did take an extra torsemide. I told her I did not think the diarrhea and bloody stool were related to the scans. She did receive IV hydration and held her torsemide around her scans so she certainly could be volume overloaded. I think an extra torsemide is a good idea. We would also need to be cognizant of her potassium level given ongoing diarrhea and extra diuretic. She is not on supplementation but last K was in good range. She said the most blood she has seen in about 1/2 a cup. I told her if this continues she needs to go the ER for evaluation. She has an appt with Dr. Cyndia Bent tomorrow which she would like to keep for now.   Angelena Form PA-C  [facility]

## 2021-11-27 ENCOUNTER — Institutional Professional Consult (permissible substitution): Payer: Medicare Other | Admitting: Surgery

## 2021-11-27 ENCOUNTER — Encounter: Payer: Self-pay | Admitting: Surgery

## 2021-11-27 VITALS — BP 125/49 | HR 80 | Resp 20 | Ht 65.0 in | Wt 167.0 lb

## 2021-11-27 DIAGNOSIS — I350 Nonrheumatic aortic (valve) stenosis: Secondary | ICD-10-CM | POA: Diagnosis not present

## 2021-11-27 NOTE — Progress Notes (Signed)
Patient ID: [name], female   DOB: [birth year], 66 y.o.   MRN: [id number]  [facility]       [address]       [place] [address]             [phone]          CARDIOTHORACIC SURGERY CONSULTATION REPORT  PCP is [name], MD Referring Provider is Sherren Mocha, MD Primary Cardiologist is Minus Breeding, MD  Reason for consultation:  Severe aortic stenosis  HPI:  [name] a 66 year old woman with history of hypertension, hyperlipidemia, type 2 diabetes, stage III chronic kidney disease, coronary artery disease status post CABG 2009, bilateral below-knee amputation of the right leg in 2008 and left leg in 2015, and chronic anemia who has been followed for aortic stenosis with serial echocardiograms.  She was hospitalized with respiratory failure in December 2022 and found to be COVID-positive with elevated troponin consistent with demand ischemia.  She was then hospitalized again in February 2023 with syncope and hypotension.  She ultimately underwent a TEE showing partial fusion of the right and left coronary cusps with a mean gradient 28 mmHg and dimensionless index of 0.23.  Aortic valve area was calculated at 0.73 cm.  It was felt that she most likely had severe low-flow/low gradient aortic stenosis.  She is here today with her son.  She is in a wheelchair but has bilateral lower extremity prostheses on and ambulates at home.  She is independent and able to take care of her own ADLs.  Past Medical History:  Diagnosis Date   Anemia, normocytic normochromic 03/14/2008   Arthritis    "hands"   CAD (coronary artery disease) of artery bypass graft 03/14/2008   Chronic kidney disease (CKD) 12/26/2015   stage 3 due to type 2 diabetes mellitus   Chronic pain syndrome 09/25/2015   Coronary artery disease involving native coronary artery without angina pectoris 09/25/2015   Essential  hypertension 03/14/2008   Gastroesophageal reflux disease with esophagitis 07/09/2016   Generalized anxiety disorder with panic attacks    History of one panic attack   GERD (gastroesophageal reflux disease)    takes Zantac   Hyperlipidemia, unspecified 03/14/2008   Localization-related symptomatic epilepsy and epileptic syndromes with complex partial seizures, not intractable, without status epilepticus 06/27/2014   Major depressive disorder 03/14/2008   Neuropathy    Osteomyelitis of ankle or foot, left, acute 06/17/2013   Peripheral vascular disease    PVD (peripheral vascular disease) 03/25/2012   Type 2 diabetes mellitus with diabetic mononeuropathy, with long-term current use of insulin 03/14/2008    Past Surgical History:  Procedure Laterality Date   AMPUTATION Left 08/22/2013   Procedure: LEFT BELOW KNEE AMPUTATION;  Surgeon: Newt Minion, MD;  Location: [facility];  Service: Orthopedics;  Laterality: Left;   ANKLE FUSION Left 05/11/2013   TIBIOCALCANEAL FUSION    ANKLE FUSION Left 05/12/2013   Procedure: LEFT TIBIOCALCANEAL FUSION;  Surgeon: Newt Minion, MD;  Location: [facility];  Service: Orthopedics;  Laterality: Left;  Left Tibiocalcaneal Fusion   APPLICATION OF WOUND VAC  06/17/2013   Procedure: APPLICATION OF WOUND VAC;  Surgeon: Newt Minion, MD;  Location: [facility];  Service: Orthopedics;;   BELOW KNEE LEG AMPUTATION Right Jan. 2008   BUBBLE STUDY  07/31/2021   Procedure: BUBBLE STUDY;  Surgeon: Geralynn Rile, MD;  Location: [facility];  Service: Cardiovascular;;  CAROTID ENDARTERECTOMY Right    CATARACT EXTRACTION Left 12/25/14   CHOLECYSTECTOMY     COLONOSCOPY     COLONOSCOPY N/A 04/24/2021   Procedure: COLONOSCOPY;  Surgeon: Toledo, Benay Pike, MD;  Location: [facility];  Service: Gastroenterology;  Laterality: N/A;   CORONARY ARTERY BYPASS GRAFT  12/01/2007   "CABG X4" (05/12/2013)   DILATION AND CURETTAGE OF UTERUS     I & D EXTREMITY Left 06/17/2013   Procedure:  IRRIGATION AND DEBRIDEMENT EXTREMITY, PLACEMENT ANTIBIOTIC STIMULAN BEADS;  Surgeon: Newt Minion, MD;  Location: [facility];  Service: Orthopedics;  Laterality: Left;   INTRAMEDULLARY (IM) NAIL INTERTROCHANTERIC Left 08/20/2018   Procedure: INTRAMEDULLARY (IM) NAIL INTERTROCHANTRIC;  Surgeon: Shona Needles, MD;  Location: [facility];  Service: Orthopedics;  Laterality: Left;   IRRIGATION AND DEBRIDEMENT ABSCESS Left 06/17/2013   ANKLE           DR [name]   RIGHT/LEFT HEART CATH AND CORONARY/GRAFT ANGIOGRAPHY N/A 10/07/2021   Procedure: RIGHT/LEFT HEART CATH AND CORONARY/GRAFT ANGIOGRAPHY;  Surgeon: Sherren Mocha, MD;  Location: [facility];  Service: Cardiovascular;  Laterality: N/A;   TEE WITHOUT CARDIOVERSION N/A 07/31/2021   Procedure: TRANSESOPHAGEAL ECHOCARDIOGRAM (TEE);  Surgeon: Geralynn Rile, MD;  Location: [facility];  Service: Cardiovascular;  Laterality: N/A;   TOE AMPUTATION Right    "took off a couple toes before the amputation"   TONSILLECTOMY      Family History  Problem Relation Age of Onset   Cancer Mother    Heart disease Father    Diabetes Father    Hyperlipidemia Father    Hypertension Father    Alzheimer's disease Maternal Grandfather     Social History   Socioeconomic History   Marital status: Married    Spouse name: Not on file   Number of children: 2   Years of education: 14   Highest education level: Associate degree: occupational, Hotel manager, or vocational program  Occupational History   Occupation: Disability  Tobacco Use   Smoking status: Never   Smokeless tobacco: Never  Vaping Use   Vaping Use: Never used  Substance and Sexual Activity   Alcohol use: No   Drug use: No   Sexual activity: Not Currently  Other Topics Concern   Not on file  Social History Narrative   Right handed    Lives in a one story home   Drinks caffeine    Social Determinants of Health   Financial Resource Strain: Not on file  Food Insecurity: Not on file   Transportation Needs: Not on file  Physical Activity: Not on file  Stress: Not on file  Social Connections: Not on file  Intimate Partner Violence: Not on file    Prior to Admission medications   Medication Sig Start Date End Date Taking? Authorizing Provider  acetaminophen-codeine (TYLENOL #3) 300-30 MG tablet Take 1 tablet by mouth every 6 (six) hours as needed for severe pain. 04/26/21  Yes [provider]  albuterol (VENTOLIN HFA) 108 (90 Base) MCG/ACT inhaler Inhale 1-2 puffs into the lungs every 6 (six) hours as needed for wheezing or shortness of breath.  05/13/17  Yes [provider]  ALPRAZolam Duanne Moron) 0.5 MG tablet Take 0.5 mg by mouth 2 (two) times daily as needed for anxiety. 09/06/19  Yes [provider]  aspirin 81 MG tablet Take 1 tablet (81 mg total) by mouth daily. 05/13/17  Yes Libby Maw, MD  carvedilol (COREG) 6.25 MG tablet Take 1 tablet (6.25  mg total) by mouth 2 (two) times daily. 02/06/21  Yes Minus Breeding, MD  clopidogrel (PLAVIX) 75 MG tablet Take 1 tablet (75 mg total) by mouth daily. 05/13/17  Yes Libby Maw, MD  cycloSPORINE (RESTASIS) 0.05 % ophthalmic emulsion Place 1 drop into both eyes 2 (two) times daily as needed (dry eyes).   Yes [provider]  diclofenac Sodium (VOLTAREN) 1 % GEL Apply 1 application. topically 4 (four) times daily as needed (pain).   Yes [provider]  ezetimibe (ZETIA) 10 MG tablet Take 10 mg by mouth daily.   Yes [provider]  fluconazole (DIFLUCAN) 150 MG tablet Take 1 tablet by mouth 2 (two) times daily. 10/22/21  Yes [provider]  HUMALOG KWIKPEN 100 UNIT/ML KwikPen Inject 12 Units into the skin 2 (two) times daily with a meal. 09/22/21  Yes [provider]  JARDIANCE 25 MG TABS tablet Take 25 mg by mouth daily. 09/25/21  Yes [provider]  LANTUS SOLOSTAR 100 UNIT/ML Solostar Pen Inject 38 Units into the skin at bedtime.  07/13/18  Yes [provider]  Levetiracetam 750 MG TB24 Take 2 tablets (1,500 mg total) by mouth at bedtime. 04/08/21  Yes Cameron Sprang, MD  lidocaine (LIDODERM) 5 % Place 1 patch onto the skin daily as needed (pain). 07/16/18  Yes [provider]  losartan (COZAAR) 50 MG tablet Take 50 mg by mouth daily.   Yes [provider]  Multiple Vitamins-Minerals (MULTIVITAMINS THER. W/MINERALS) TABS Take 1 tablet by mouth daily.     Yes [provider]  mupirocin ointment (BACTROBAN) 2 % Apply 1 Application topically 3 (three) times daily. 07/31/21  Yes [provider]  nitroGLYCERIN (NITROSTAT) 0.4 MG SL tablet Place 0.4 mg under the tongue every 5 (five) minutes as needed for chest pain.   Yes [provider]  pioglitazone (ACTOS) 15 MG tablet Take 15 mg by mouth daily. 07/09/16  Yes [provider]  pregabalin (LYRICA) 50 MG capsule TAKE 1 CAPSULE BY MOUTH EVERY NIGHT 08/01/21  Yes Cameron Sprang, MD  rosuvastatin (CRESTOR) 40 MG tablet Take 40 mg by mouth daily.   Yes [provider]  sertraline (ZOLOFT) 100 MG tablet Take 1 tablet (100 mg total) by mouth 2 (two) times daily. 05/13/17  Yes Libby Maw, MD  torsemide (DEMADEX) 20 MG tablet Take 20 mg by mouth daily. TAKE EXTRA TABLET AS NEEDED   Yes [provider]    Current Outpatient Medications  Medication Sig Dispense Refill   acetaminophen-codeine (TYLENOL #3) 300-30 MG tablet Take 1 tablet by mouth every 6 (six) hours as needed for severe pain.     albuterol (VENTOLIN HFA) 108 (90 Base) MCG/ACT inhaler Inhale 1-2 puffs into the lungs every 6 (six) hours as needed for wheezing or shortness of breath.      ALPRAZolam (XANAX) 0.5 MG tablet Take 0.5 mg by mouth 2 (two) times daily as needed for anxiety.     aspirin 81 MG tablet Take 1 tablet (81 mg total) by mouth daily. 90 tablet 0   carvedilol (COREG) 6.25 MG tablet Take 1 tablet (6.25 mg total) by mouth 2  (two) times daily. 180 tablet 3   clopidogrel (PLAVIX) 75 MG tablet Take 1 tablet (75 mg total) by mouth daily. 90 tablet 0   cycloSPORINE (RESTASIS) 0.05 % ophthalmic emulsion Place 1 drop into both eyes 2 (two) times daily as needed (dry eyes).     diclofenac  Sodium (VOLTAREN) 1 % GEL Apply 1 application. topically 4 (four) times daily as needed (pain).     ezetimibe (ZETIA) 10 MG tablet Take 10 mg by mouth daily.     fluconazole (DIFLUCAN) 150 MG tablet Take 1 tablet by mouth 2 (two) times daily.     HUMALOG KWIKPEN 100 UNIT/ML KwikPen Inject 12 Units into the skin 2 (two) times daily with a meal.     JARDIANCE 25 MG TABS tablet Take 25 mg by mouth daily.     LANTUS SOLOSTAR 100 UNIT/ML Solostar Pen Inject 38 Units into the skin at bedtime.     Levetiracetam 750 MG TB24 Take 2 tablets (1,500 mg total) by mouth at bedtime. 60 tablet 8   lidocaine (LIDODERM) 5 % Place 1 patch onto the skin daily as needed (pain).     losartan (COZAAR) 50 MG tablet Take 50 mg by mouth daily.     Multiple Vitamins-Minerals (MULTIVITAMINS THER. W/MINERALS) TABS Take 1 tablet by mouth daily.       mupirocin ointment (BACTROBAN) 2 % Apply 1 Application topically 3 (three) times daily.     nitroGLYCERIN (NITROSTAT) 0.4 MG SL tablet Place 0.4 mg under the tongue every 5 (five) minutes as needed for chest pain.     pioglitazone (ACTOS) 15 MG tablet Take 15 mg by mouth daily.     pregabalin (LYRICA) 50 MG capsule TAKE 1 CAPSULE BY MOUTH EVERY NIGHT 90 capsule 2   rosuvastatin (CRESTOR) 40 MG tablet Take 40 mg by mouth daily.     sertraline (ZOLOFT) 100 MG tablet Take 1 tablet (100 mg total) by mouth 2 (two) times daily. 180 tablet 0   torsemide (DEMADEX) 20 MG tablet Take 20 mg by mouth daily. TAKE EXTRA TABLET AS NEEDED     No current facility-administered medications for this visit.    Allergies  Allergen Reactions   Metformin Hcl Nausea And Vomiting   Ace Inhibitors Cough   Actos [Pioglitazone] Swelling    Atorvastatin Other (See Comments)    myalgias   Candesartan Cilexetil Other (See Comments)    Rash "cant recall"    Gabapentin Other (See Comments)    Dizziness and unbalanced   Influenza Vaccines     nearly killed me   Isosorbide Nitrate Other (See Comments)    HA.   Keflex [Cephalexin]      stomach upset   Quinapril Hcl Cough   Tramadol Hcl Other (See Comments)    seziure   Tape Rash    Redness, rash, itchiness       Review of Systems:   General:  + decreased appetite, + decreased energy, no weight gain, no weight loss, no fever  Cardiac:  no chest pain with exertion, no chest pain at rest, + SOB with moderate exertion, no resting SOB, no PND, no orthopnea, no palpitations, no arrhythmia, no atrial fibrillation, no LE edema, + dizzy spells, no syncope  Respiratory:  + exertional shortness of breath, no home oxygen, no productive cough, no dry cough, no bronchitis, no wheezing, no hemoptysis, no asthma, no pain with inspiration or cough, no sleep apnea, no CPAP at night  GI:   no difficulty + frequency, no urinary tract infection, no hematuria, no enlarged prostate, no kidney stones, no kidney disease  Vascular:  no pain suggestive of claudication, no pain in feet, no leg cramps, no varicose veins, no DVT, no non-healing foot ulcer  Neuro:  stroke, no TIA's, no seizures, no headaches, no temporary blindness one eye,  no slurred speech, no peripheral neuropathy, no chronic pain, no instability of gait, no memory/cognitive dysfunction  Musculoskeletal: no arthritis - primarily, no joint swelling, no myalgias, no difficulty walking, normal mobility   Skin:             no rash, no itching, no skin infections, no pressure sores or ulcerations  Psych:   + anxiety, + depression, + nervousness, + unusual recent stress  Eyes:   no blurry vision, no floaters, no recent vision changes, + wears glasses or contacts  ENT:   no hearing loss, no loose or painful teeth, no dentures, last  saw dentist 6 months ago  Hematologic:  no easy bruising, no abnormal bleeding, no clotting disorder, no frequent epistaxis  Endocrine:  + diabetes, does not check CBG's at home+     Physical Exam:   BP (!) 125/49   Pulse 80   Resp 20 Comment: RA  Ht '5\' 5"'$  (1.651 m)   Wt 167 lb (75.8 kg)   BMI 27.79 kg/m   General:  well-appearing  HEENT:  Unremarkable, NCAT, PERLA, EOMI  Neck:   no JVD, no bruits, no adenopathy   Chest:   clear to auscultation, symmetrical breath sounds, no wheezes, no rhonchi   CV:   RRR, 3/6 systolic murmur RSB, no diastolic murmr  Abdomen:  soft, non-tender, no masses   Extremities:  warm, well-perfused, pulses palpable in ankles, no lower extremity edema  Rectal/GU  Deferred  Neuro:   Grossly non-focal and symmetrical throughout  Skin:   Clean and dry, no rashes, no breakdown  Diagnostic Tests:    TRANSESOPHOGEAL ECHO REPORT         Patient Name:   ABILENE MCPHEE Date of Exam: 07/31/2021  Medical Rec #:  [id number]        Height:       65.0 in  Accession #:    [id number]       Weight:       176.0 lb  Date of Birth:  [birth year]         BSA:          1.874 m  Patient Age:    35 years         BP:           84/40 mmHg  Patient Gender: F                HR:           91 bpm.  Exam Location:  Inpatient   Procedure: Transesophageal Echo, Cardiac Doppler, Color Doppler, 3D Echo  and             Saline Contrast Bubble Study   Indications:     Aortic stenosis     History:         Patient has prior history of Echocardiogram examinations,  most                   recent 04/22/2021. CHF, CAD and Previous Myocardial  Infarction,                   Aortic Valve Disease and Mitral Valve Disease; Risk                   Factors:Hypertension. HFrEF. PVD.     Sonographer:     Clayton Lefort RDCS (AE)  Referring Phys:  [id number] [place]  Diagnosing Phys: Eleonore Chiquito MD   PROCEDURE: After discussion of the risks and benefits of a TEE, an  informed consent  was obtained from the patient. TEE procedure time was 26  minutes. The transesophogeal probe was passed without difficulty through  the esophogus of the patient. Imaged  were obtained with the patient in a left lateral decubitus position.  Sedation performed by different physician. The patient was monitored while  under deep sedation. Anesthestetic sedation was provided intravenously by  Anesthesiology: 301.'74mg'$  of  Propofol, '80mg'$  of Lidocaine. Image quality was excellent. The patient's  vital signs; including heart rate, blood pressure, and oxygen saturation;  remained stable throughout the procedure. The patient developed no  complications during the procedure.   IMPRESSIONS     1. Tricuspid AoV with severe calcifications. There is partial fusion of  the RCC/LCC which could be acquired. Vmax 3.52 m/s, MG 28 mmHG, AVA 0.73  cm2, DI 0.23. Aortic stenosis is severe. Direct 3D planimetry 0.87 cm2.  Overall, suspect low flow low  gradient AS. CW jet is rounded consistent with severe AS as well. The  aortic valve is tricuspid. There is severe calcifcation of the aortic  valve. There is severe thickening of the aortic valve. Aortic valve  regurgitation is not visualized.   2. Degnerative mitral valve disease. Moderate mitral regurgitation  secondary to restricted PMVL from calcifications (IIIB). 2D PISA radius  0.7 cm, ERO 0.23 cm2, R vol 39 cc, RF 38%. 3D VCA 0.24 cm2. PV flow is  systolic dominant or blunted in all veins.  Mild mitral stenosis is present. MG 6.0 mmHG @ 82 bpm. MVA by 3D  planimetry 1.97 cm2. The mitral valve is degenerative. Moderate mitral  valve regurgitation. Mild mitral stenosis. Moderate to severe mitral  annular calcification.   3. Left ventricular ejection fraction, by estimation, is 40 to 45%. The  left ventricle has mildly decreased function. The left ventricle  demonstrates global hypokinesis.   4. Right ventricular systolic function is normal. The right  ventricular  size is mildly enlarged.   5. Left atrial size was mild to moderately dilated. No left atrial/left  atrial appendage thrombus was detected. The LAA emptying velocity was 54  cm/s.   6. Right atrial size was mildly dilated.   7. There is mild (Grade II) layered plaque involving the descending  aorta.   8. Agitated saline contrast bubble study was negative, with no evidence  of any interatrial shunt.   FINDINGS   Left Ventricle: Left ventricular ejection fraction, by estimation, is 40  to 45%. The left ventricle has mildly decreased function. The left  ventricle demonstrates global hypokinesis. The left ventricular internal  cavity size was normal in size.   Right Ventricle: The right ventricular size is mildly enlarged. No  increase in right ventricular wall thickness. Right ventricular systolic  function is normal.   Left Atrium: Left atrial size was mild to moderately dilated. No left  atrial/left atrial appendage thrombus was detected. The LAA emptying  velocity was 54 cm/s.   Right Atrium: Right atrial size was mildly dilated.   Pericardium: There is no evidence of pericardial effusion.   Mitral Valve: Degnerative mitral valve disease. Moderate mitral  regurgitation secondary to restricted PMVL from calcifications (IIIB). 2D  PISA radius 0.7 cm, ERO 0.23 cm2, R vol 39 cc, RF 38%. 3D VCA 0.24 cm2. PV  flow is systolic dominant or blunted in  all veins. Mild mitral stenosis is present.  MG 6.0 mmHG @ 82 bpm. MVA by  3D planimetry 1.97 cm2. The mitral valve is degenerative in appearance.  Moderate to severe mitral annular calcification. Moderate mitral valve  regurgitation. Mild mitral valve  stenosis. MV peak gradient, 9.4 mmHg. The mean mitral valve gradient is  6.0 mmHg with average heart rate of 82 bpm.   Tricuspid Valve: The tricuspid valve is grossly normal. Tricuspid valve  regurgitation is mild . No evidence of tricuspid stenosis.   Aortic Valve:  Tricuspid AoV with severe calcifications. There is partial  fusion of the RCC/LCC which could be acquired. Vmax 3.52 m/s, MG 28 mmHG,  AVA 0.73 cm2, DI 0.23. Aortic stenosis is severe. Direct 3D planimetry  0.87 cm2. Overall, suspect low flow  low gradient AS. CW jet is rounded consistent with severe AS as well. The  aortic valve is tricuspid. There is severe calcifcation of the aortic  valve. There is severe thickening of the aortic valve. Aortic valve  regurgitation is not visualized. Aortic  valve mean gradient measures 28.3 mmHg. Aortic valve peak gradient  measures 49.7 mmHg. Aortic valve area, by VTI measures 0.73 cm.   Pulmonic Valve: The pulmonic valve was grossly normal. Pulmonic valve  regurgitation is not visualized. No evidence of pulmonic stenosis.   Aorta: The aortic root and ascending aorta are structurally normal, with  no evidence of dilitation. There is mild (Grade II) layered plaque  involving the descending aorta.   Venous: The left upper pulmonary vein, left lower pulmonary vein, right  lower pulmonary vein and right upper pulmonary vein are normal.   IAS/Shunts: The atrial septum is grossly normal. Agitated saline contrast  was given intravenously to evaluate for intracardiac shunting. Agitated  saline contrast bubble study was negative, with no evidence of any  interatrial shunt.      LEFT VENTRICLE  PLAX 2D  LVOT diam:     2.00 cm  LV SV:         63  LV SV Index:   34  LVOT Area:     3.14 cm      AORTIC VALVE  AV Area (Vmax):    0.84 cm  AV Area (Vmean):   0.83 cm  AV Area (VTI):     0.73 cm  AV Vmax:           352.36 cm/s  AV Vmean:          236.435 cm/s  AV VTI:            0.867 m  AV Peak Grad:      49.7 mmHg  AV Mean Grad:      28.3 mmHg  LVOT Vmax:         94.10 cm/s  LVOT Vmean:        62.100 cm/s  LVOT VTI:          0.202 m  LVOT/AV VTI ratio: 0.23     AORTA  Ao Root diam: 2.41 cm  Ao Asc diam:  3.00 cm   MITRAL VALVE  MV Peak  grad: 9.4 mmHg        SHUNTS  MV Mean grad: 6.0 mmHg        Systemic VTI:  0.20 m  MV Vmax:      1.53 m/s        Systemic Diam: 2.00 cm  MV Vmean:     123.0 cm/s  MR Peak grad:    117.1 mmHg  MR Mean grad:  73.0 mmHg  MR Vmax:         541.00 cm/s  MR Vmean:        393.0 cm/s  MR PISA:         3.08 cm  MR PISA Eff ROA: 23 mm  MR PISA Radius:  0.70 cm   Eleonore Chiquito MD  Electronically signed by Eleonore Chiquito MD  Signature Date/Time: 07/31/2021/2:32:05 PM         Final (Updated)     Physicians  Panel Physicians Referring Physician Case Authorizing Physician  Sherren Mocha, MD (Primary)     Procedures  RIGHT/LEFT HEART CATH AND CORONARY/GRAFT ANGIOGRAPHY   Conclusion  1.  Severe native three-vessel coronary artery disease with total occlusion of the LAD, left circumflex, and right coronary arteries 2.  Status post CABG with continued patency of the LIMA to LAD and free left radial graft OM.  Total occlusion of the SVG to RCA and SVG to diagonal. 3.  Extensive collateralization of the distal RCA branches from left to right collaterals 4.  Moderate aortic stenosis with mean gradient 25 mmHg, peak to peak gradient 31 mmHg, calculated aortic valve area 1.4 cm  Recommendations: We will review her case with the multidisciplinary heart valve team.  Her aortic stenosis has appeared to be severe, low-flow low gradient paradoxical AAS (stage D3) by echo assessment.  With her multiple hospitalizations and comorbid conditions, should consider continuing on with TAVR evaluation.  Vascular access may be an issue based on today's evaluation.   Indications  Nonrheumatic aortic valve stenosis I35.0 (ICD-10-CM)   Procedural Details  Technical Details INDICATION: 66 year old woman with multiple medical problems presents for right and left heart catheterization as part of an evaluation for treatment of her aortic stenosis.  She has had bilateral BKA surgeries as related to complications  from diabetes.  She has known CAD and underwent multivessel CABG in 2009.  She has developed progressive symptoms of severe aortic stenosis with multiple hospitalizations for heart failure over the past year.  She also has chronic iron deficiency anemia and stage IIIb chronic kidney disease.  Presenting now for right and left heart catheterization.  PROCEDURAL DETAILS: The right groin is imaged with ultrasound and the femoral artery does not appear to be patent.  Attention is turned to the left groin. The groin is prepped, draped, and anesthetized with 1% lidocaine. Using direct ultrasound guidance, and a micropuncture technique, a 5 French sheath is placed in the right femoral artery and a 7 French sheath is placed in the right femoral vein. [place] images are captured and stored in the patient's chart. A Swan-Ganz catheter is used for the right heart catheterization. Standard protocol is followed for recording of right heart pressures and sampling of oxygen saturations. Fick cardiac output is calculated. Standard Judkins catheters are used for selective coronary angiography and bypass graft angiography. LV pressure is recorded and an aortic valve pullback gradient is recorded.  The aortic valve was crossed with a J-wire.  There are no immediate procedural complications. [name] transferred to the post catheterization recovery area for further monitoring.      Estimated blood loss <50 mL.   During this procedure medications were administered to achieve and maintain moderate conscious sedation while the patient's heart rate, blood pressure, and oxygen saturation were continuously monitored and I was present face-to-face 100% of this time.   Medications (Filter: Administrations occurring from 1216 to 1326 on 10/07/21)  important  Continuous medications are totaled by  the amount administered until 10/07/21 1326.   fentaNYL (SUBLIMAZE) injection (mcg) Total dose:  50 mcg  Date/Time Rate/Dose/Volume  Action   10/07/21 1233 25 mcg Given   1244 25 mcg Given    midazolam (VERSED) injection (mg) Total dose:  2 mg  Date/Time Rate/Dose/Volume Action   10/07/21 1233 1 mg Given   1244 1 mg Given    lidocaine (PF) (XYLOCAINE) 1 % injection (mL) Total volume:  5 mL  Date/Time Rate/Dose/Volume Action   10/07/21 Canceled Entry   1252 5 mL Given    Heparin (Porcine) in NaCl 1000-0.9 UT/500ML-% SOLN (mL) Total volume:  1,000 mL  Date/Time Rate/Dose/Volume Action   10/07/21 1244 500 mL Given   1244 500 mL Given    iohexol (OMNIPAQUE) 350 MG/ML injection (mL) Total volume:  45 mL  Date/Time Rate/Dose/Volume Action   10/07/21 1325 45 mL Given    Sedation Time  Sedation Time Physician-1: 41 minutes 8 seconds Contrast  Medication Name Total Dose  iohexol (OMNIPAQUE) 350 MG/ML injection 45 mL   Radiation/Fluoro  Fluoro time: 7.4 (min) DAP: 19403 (mGycm2) Cumulative Air Kerma: 696 (mGy) Complications  Complications documented before study signed (10/07/2021  7:89 PM)   No complications were associated with this study.  Documented by Bethann Punches, RN - 10/07/2021  1:26 PM     Coronary Findings  Diagnostic Dominance: Right Left Main  Ost LM to Dist LM lesion is 50% stenosed. The lesion is calcified.    Left Anterior Descending  Prox LAD to Mid LAD lesion is 100% stenosed.    Left Circumflex  Prox Cx lesion is 100% stenosed.    Right Coronary Artery  Ost RCA to Prox RCA lesion is 100% stenosed. The lesion is calcified.    Right Posterior Descending Artery  Collaterals  RPDA filled by collaterals from 1st Sept.    Collaterals  RPDA filled by collaterals from Dist LAD.      LIMA LIMA Graft To Mid LAD  LIMA graft was visualized by angiography and is large. The graft exhibits no disease. The LIMA to LAD graft is widely patent    Graft To 1st Diag  The saphenous vein graft to diagonal is totally occluded  Origin to Prox Graft lesion is 100% stenosed.     Graft To Dist RCA  The saphenous vein graft to distal RCA is totally occluded  Origin lesion is 100% stenosed.    Left Radial Artery Graft To 1st Mrg  Left radial artery graft was visualized by angiography and is normal in caliber. The graft exhibits no disease. The left radial graft to obtuse marginal is patent with no significant stenosis. There is some pressure dampening with the catheter engaged in the graft.    Intervention   No interventions have been documented.   Left Heart  Aortic Valve There is moderate aortic valve stenosis. The aortic valve is calcified. There is restricted aortic valve motion.   Coronary Diagrams  Diagnostic Dominance: Right  Intervention  Implants     No implant documentation for this case.   Syngo Images   Show images for CARDIAC CATHETERIZATION Images on Long Term Storage   Show images for Shreshta, Medley to Procedure Log  Procedure Log    Hemo Data  Flowsheet Row Most Recent Value  Fick Cardiac Output 8.26 L/min  Fick Cardiac Output Index 4.48 (L/min)/BSA  Aortic Mean Gradient 24.79 mmHg  Aortic Peak Gradient 30.5 mmHg  Aortic Valve Area 1.40  Aortic  Value Area Index 0.76 cm2/BSA  RA A Wave 9 mmHg  RA V Wave 12 mmHg  RA Mean 7 mmHg  RV Systolic Pressure 52 mmHg  RV Diastolic Pressure 4 mmHg  RV EDP 11 mmHg  PA Systolic Pressure 55 mmHg  PA Diastolic Pressure 27 mmHg  PA Mean 40 mmHg  PW A Wave 23 mmHg  PW V Wave 19 mmHg  PW Mean 18 mmHg  AO Systolic Pressure 408 mmHg  AO Diastolic Pressure 47 mmHg  AO Mean 72 mmHg  LV Systolic Pressure 144 mmHg  LV Diastolic Pressure 19 mmHg  LV EDP 20 mmHg  AOp Systolic Pressure 818 mmHg  AOp Diastolic Pressure 38 mmHg  AOp Mean Pressure 77 mmHg  LVp Systolic Pressure 563 mmHg  LVp Diastolic Pressure 5 mmHg  LVp EDP Pressure 18 mmHg  QP/QS 1  TPVR Index 6.92 HRUI    ADDENDUM REPORT: 11/22/2021 14:56   CLINICAL DATA:  Severe Aortic Stenosis.   EXAM: Cardiac TAVR  CT   TECHNIQUE: A non-contrast, gated CT scan was obtained with axial slices of 3 mm through the heart for aortic valve calcium scoring. A 90 kV retrospective, gated, contrast cardiac scan was obtained. Gantry rotation speed was 250 msecs and collimation was 0.6 mm. Nitroglycerin was not given. The 3D data set was reconstructed in 5% intervals of the 0-95% of the R-R cycle. Systolic and diastolic phases were analyzed on a dedicated workstation using MPR, MIP, and VRT modes. The patient received 100 cc of contrast.   FINDINGS: Image quality: Excellent.   Noise artifact is: Limited.   Valve Morphology: Tricuspid aortic valve with severe calcifications and severely restricted movement in systole. Appears to be acquired fusion of the RCC/LCC and these leaflets are immobile. Bulky calcification of the RCC/LCC.   Aortic Valve Calcium score:   Aortic annular dimension:   Phase assessed: 25%   Annular area: 349 mm2   Annular perimeter: 67.8 mm   Max diameter: 24.4 mm   Min diameter: 19.6 mm   Annular and subannular calcification: Minimal calcium under the [place].   Membranous septum length: 7.9 mm   Optimal coplanar projection: LAO 9 CRA 23   Coronary Artery Height above Annulus:   Left Main: 12.6 mm   Right Coronary: 13.6 mm   Sinus of Valsalva Measurements:   Non-coronary: 24.0 mm   Right-coronary: 26.6 mm   Left-coronary: 24.4 mm   Sinus of Valsalva Height:   Non-coronary: 19.0 mm   Right-coronary: 17.3 mm   Left-coronary: 16.6 mm   Sinotubular Junction: 24 mm   Ascending Thoracic Aorta: 33 mm   Coronary Arteries: Normal coronary origin. Right dominance. The study was performed without use of NTG and is insufficient for plaque evaluation. Please refer to recent cardiac catheterization for coronary assessment. S/p CABG. Patent LIMA-LAD and graft to OM.   Cardiac Morphology:   Right Atrium: Right atrial size is within normal limits.   Right  Ventricle: The right ventricular cavity is within normal limits.   Left Atrium: Left atrial size is normal in size with no left atrial appendage filling defect.   Left Ventricle: The ventricular cavity size is within normal limits.   Pulmonary arteries: Normal in size without proximal filling defect.   Pulmonary veins: Normal pulmonary venous drainage.   Pericardium: Normal thickness with no significant effusion or calcium present.   Mitral Valve: The mitral valve is degenerative with severe annular calcium.   Extra-cardiac findings: See attached radiology report for non-cardiac  structures.   IMPRESSION: 1. 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   2. Annular measurements support a 23 mm S3 (349 mm2). Recommend against an evolut pro due to narrow aortic root.   3. Minimal annular calcium under the [place].   4. Sufficient coronary to annulus distance.   5. Optimal Fluoroscopic Angle for Delivery: LAO 9 CRA 23   [place] T. Audie Box, MD     Electronically Signed   By: Eleonore Chiquito M.D.   On: 11/22/2021 14:56    Addended by Geralynn Rile, MD on 11/22/2021  5:26 PM   Study Result  Narrative & Impression  EXAM: OVER-READ INTERPRETATION  CT CHEST   The following report is a limited chest CT over-read performed by radiologist Dr. Yetta Glassman of [facility], [place] on 11/22/2021. This over-read does not include interpretation of cardiac or coronary anatomy or pathology. The cardiac TAVR interpretation by the cardiologist is attached.   COMPARISON:  None Available.   FINDINGS: Extracardiac findings will be described separately under dictation for contemporaneously obtained CTA chest, abdomen and pelvis.   IMPRESSION: Please see separate dictation for contemporaneously obtained CTA chest, abdomen and pelvis dated 11/22/2021 for full description of relevant extracardiac findings.   Electronically Signed: By: Yetta Glassman M.D. On:  11/22/2021 13:30      Narrative & Impression  CLINICAL DATA:  Aortic valve replacement preop evaluation   EXAM: CT ANGIOGRAPHY CHEST, ABDOMEN AND PELVIS   TECHNIQUE: Non-contrast CT of the chest was initially obtained.   Multidetector CT imaging through the chest, abdomen and pelvis was performed using the standard protocol during bolus administration of intravenous contrast. Multiplanar reconstructed images and MIPs were obtained and reviewed to evaluate the vascular anatomy.   RADIATION DOSE REDUCTION: This exam was performed according to the departmental dose-optimization program which includes automated exposure control, adjustment of the mA and/or kV according to patient size and/or use of iterative reconstruction technique.   CONTRAST:  135m OMNIPAQUE IOHEXOL 350 MG/ML SOLN   COMPARISON:  None Available.   FINDINGS: CTA CHEST FINDINGS   Cardiovascular: Normal heart size. No pericardial effusion. Aortic valve thickening and calcifications. Normal caliber thoracic aorta with mild atherosclerotic disease. Main and three-vessel coronary artery calcifications status post CABG. Patent LIMA and vein grafts. Mitral annular calcifications. No suspicious filling defects of the central pulmonary arteries. Standard three-vessel aortic arch with no significant stenosis.   Mediastinum/Nodes: Esophagus and thyroid are unremarkable. Mildly enlarged mediastinal lymph nodes, likely reactive. Reference subcarinal lymph node measuring 1.4 cm in short axis on series 3, image 163.   Lungs/Pleura: Central airways are patent. Mild diffuse ground-glass opacities and smooth septal thickening. No consolidation, pleural effusion pneumothorax.   Musculoskeletal: Prior median sternotomy with intact sternal wires. No aggressive appearing osseous lesions.   CTA ABDOMEN AND PELVIS FINDINGS   Hepatobiliary: No focal liver abnormality is seen. Status post cholecystectomy. No biliary  dilatation.   Pancreas: Unremarkable. No pancreatic ductal dilatation or surrounding inflammatory changes.   Spleen: Splenomegaly, measuring up to 16.5 cm.   Adrenals/Urinary Tract: Adrenal glands are unremarkable. Kidneys are normal, without renal calculi, focal lesion, or hydronephrosis. Bladder is unremarkable.   Stomach/Bowel: Stomach is within normal limits. Appendix appears normal. No evidence of bowel wall thickening, distention, or inflammatory changes.   Vascular/lymphatic: Normal caliber abdominal aorta with severe calcified plaque. Major branch vessels are patent. Moderate narrowing at the origin of the SMA and right renal artery due to calcified plaque. Moderate to severe narrowing  at the origin of the IMA due to calcified plaque. No pathologically enlarged lymph nodes seen in the abdomen or pelvis.   Reproductive: Uterus and bilateral adnexa are unremarkable.   Other: No abdominal wall hernia or abnormality. No abdominopelvic ascites.   Musculoskeletal: Old right inferior ramus fracture. Intramedullary nail of the proximal left femur. No aggressive appearing osseous lesions.   VASCULAR MEASUREMENTS PERTINENT TO TAVR:   AORTA:   Minimal Aortic Diameter-7.2 mm   Severity of Aortic Calcification-severe   RIGHT PELVIS:   Right Common Iliac Artery -   Minimal Diameter-6.8 mm   Tortuosity-none   Calcification-mild   Right External Iliac Artery -   Minimal Diameter-5.7 mm   Tortuosity-mild   Calcification-mild   Right Common Femoral Artery -   Minimal Diameter-5.0 mm   Tortuosity-none   Calcification-mild   LEFT PELVIS:   Left Common Iliac Artery -   Minimal Diameter-6.1 mm   Tortuosity-none   Calcification-moderate   Left External Iliac Artery -   Minimal Diameter-6.0 mm   Tortuosity-mild   Calcification-mild   Left Common Femoral Artery -   Minimal Diameter-5.3 mm   Tortuosity-none   Calcification-moderate   Review of  the MIP images confirms the above findings.   IMPRESSION: 1. Vascular findings and measurements pertinent to potential TAVR procedure, as detailed above. 2. Thickening and calcification of the aortic valve, compatible with reported clinical history of aortic stenosis. 3. Severe aortoiliac atherosclerosis. Left main and 3 vessel coronary artery disease status post CABG. 4. Mild pulmonary edema. 5. Splenomegaly.     Electronically Signed   By: Yetta Glassman M.D.   On: 11/22/2021 13:29     Impression:  This 66 year old woman has stage D2, low-flow/low gradient severe aortic stenosis with NYHA class II symptoms of exertional fatigue and shortness of breath consistent with chronic diastolic congestive heart failure.  She has had multiple hospitalizations for congestive heart failure exacerbation over the past year.  I have personally reviewed her  echocardiogram, cardiac catheterization, and CTA studies.  Her echocardiogram shows a severely calcified and thickened aortic valve with restricted leaflet mobility.  The mean gradient is 28 mmHg with a valve area of 0.73 cm and dimensionless index of 0.23.  Cardiac catheterization shows severe native three-vessel coronary disease with total occlusion of the LAD, left circumflex, and right coronary arteries.  There is continued patency of the LIMA to the LAD and left radial artery graft to the OM.  There is total occlusion of the saphenous vein graft to the RCA and diagonal branch with extensive collateral flow to the right coronary artery from the left.  The mean gradient across the aortic valve was measured at 25 mmHg.  I agree that aortic valve replacement is indicated in this patient for relief of her symptoms and to prevent progressive left ventricular dysfunction.  Given her comorbidities of prior CABG, stage IIIb chronic kidney disease, diabetes, and bilateral lower extremity amputation I do not think she would be a candidate for open surgical  aortic valve placement.  I think that transcatheter aortic valve replacement would be a reasonable alternative for her.  Her gated cardiac CTA shows anatomy suitable for TAVR using a SAPIEN 3 valve.  Her abdominal and pelvic CTA shows adequate pelvic vascular anatomy to allow transfemoral insertion.  The patient and her son were counseled at length regarding treatment alternatives for management of severe symptomatic aortic stenosis. The risks and benefits of surgical intervention has been discussed in detail. Long-term prognosis with  medical therapy was discussed. Alternative approaches such as conventional surgical aortic valve replacement, transcatheter aortic valve replacement, and palliative medical therapy were compared and contrasted at length. This discussion was placed in the context of the patient's own specific clinical presentation and past medical history. All of their questions have been addressed.   Following the decision to proceed with transcatheter aortic valve replacement, a discussion was held regarding what types of management strategies would be attempted intraoperatively in the event of life-threatening complications, including whether or not the patient would be considered a candidate for the use of cardiopulmonary bypass and/or conversion to open sternotomy for attempted surgical intervention.  I do not think she is a candidate for emergent sternotomy to manage any intraoperative complications.  [name] aware of the fact that transient use of cardiopulmonary bypass may be necessary. The patient has been advised of a variety of complications that might develop including but not limited to risks of death, stroke, paravalvular leak, aortic dissection or other major vascular complications, aortic annulus rupture, device embolization, cardiac rupture or perforation, mitral regurgitation, acute myocardial infarction, arrhythmia, heart block or bradycardia requiring permanent pacemaker  placement, congestive heart failure, respiratory failure, renal failure, pneumonia, infection, other late complications related to structural valve deterioration or migration, or other complications that might ultimately cause a temporary or permanent loss of functional independence or other long term morbidity. The patient provides full informed consent for the procedure as described and all questions were answered.    Plan:  She will be scheduled for transfemoral TAVR using a SAPIEN 3 valve on 12/03/2021   I spent 60 minutes performing this consultation and > 50% of this time was spent face to face counseling and coordinating the care of this patient's severe symptomatic aortic stenosis.   Gaye Pollack, MD 11/27/2021

## 2021-11-28 DIAGNOSIS — E1142 Type 2 diabetes mellitus with diabetic polyneuropathy: Secondary | ICD-10-CM | POA: Diagnosis not present

## 2021-12-17 ENCOUNTER — Encounter: Payer: Self-pay | Admitting: Physician Assistant

## 2021-12-17 ENCOUNTER — Other Ambulatory Visit: Payer: Self-pay | Admitting: Physician Assistant

## 2021-12-17 ENCOUNTER — Telehealth: Payer: Self-pay

## 2021-12-17 DIAGNOSIS — I350 Nonrheumatic aortic (valve) stenosis: Secondary | ICD-10-CM

## 2021-12-18 NOTE — Addendum Note (Signed)
Addended by: Eileen Stanford on: 12/18/2021 08:38 AM   Modules accepted: Orders

## 2021-12-19 ENCOUNTER — Other Ambulatory Visit: Payer: Self-pay | Admitting: Neurology

## 2021-12-23 ENCOUNTER — Other Ambulatory Visit: Payer: Self-pay | Admitting: Physician Assistant

## 2021-12-23 DIAGNOSIS — I350 Nonrheumatic aortic (valve) stenosis: Secondary | ICD-10-CM

## 2021-12-26 ENCOUNTER — Telehealth: Payer: Self-pay

## 2021-12-27 ENCOUNTER — Ambulatory Visit (HOSPITAL_COMMUNITY)
Admission: RE | Admit: 2021-12-27 | Discharge: 2021-12-27 | Disposition: A | Discharge status: Home / Self Care | Payer: Medicare Other | Source: Ambulatory Visit | Attending: Physician Assistant | Admitting: Physician Assistant

## 2021-12-27 ENCOUNTER — Encounter (HOSPITAL_COMMUNITY)
Admission: RE | Admit: 2021-12-27 | Discharge: 2021-12-27 | Disposition: A | Discharge status: Home / Self Care | Payer: Medicare Other | Source: Ambulatory Visit | Attending: Cardiovascular Disease | Admitting: Cardiovascular Disease

## 2021-12-27 DIAGNOSIS — Z1152 Encounter for screening for COVID-19: Secondary | ICD-10-CM | POA: Insufficient documentation

## 2021-12-27 DIAGNOSIS — I350 Nonrheumatic aortic (valve) stenosis: Secondary | ICD-10-CM | POA: Insufficient documentation

## 2021-12-27 DIAGNOSIS — E119 Type 2 diabetes mellitus without complications: Secondary | ICD-10-CM | POA: Diagnosis not present

## 2021-12-27 DIAGNOSIS — Z01818 Encounter for other preprocedural examination: Secondary | ICD-10-CM

## 2021-12-27 LAB — URINALYSIS, ROUTINE W REFLEX MICROSCOPIC
Bacteria, UA: NONE SEEN
Bilirubin Urine: NEGATIVE
Glucose, UA: >=500 mg/dL — AB
Hgb urine dipstick: NEGATIVE
Ketones, ur: NEGATIVE mg/dL
Leukocytes,Ua: NEGATIVE
Nitrite: NEGATIVE
Protein, ur: NEGATIVE mg/dL
Specific Gravity, Urine: 1.007 (ref 1.005–1.030)
pH: 5 (ref 5.0–8.0)

## 2021-12-27 LAB — CBC
HCT: 28.2 % — ABNORMAL LOW (ref 36.0–46.0)
Hemoglobin: 8.3 g/dL — ABNORMAL LOW (ref 12.0–15.0)
MCH: 24.4 pg — ABNORMAL LOW (ref 26.0–34.0)
MCHC: 29.4 g/dL — ABNORMAL LOW (ref 30.0–36.0)
MCV: 82.9 fL (ref 80.0–100.0)
Platelets: 234 10*3/uL (ref 150–400)
RBC: 3.4 MIL/uL — ABNORMAL LOW (ref 3.87–5.11)
RDW: 17.7 % — ABNORMAL HIGH (ref 11.5–15.5)
WBC: 8.7 10*3/uL (ref 4.0–10.5)
nRBC: 0 % (ref 0.0–0.2)

## 2021-12-27 LAB — COMPREHENSIVE METABOLIC PANEL
ALT: 19 U/L (ref 0–44)
AST: 26 U/L (ref 15–41)
Albumin: 3.2 g/dL — ABNORMAL LOW (ref 3.5–5.0)
Alkaline Phosphatase: 63 U/L (ref 38–126)
Anion gap: 12 (ref 5–15)
BUN: 29 mg/dL — ABNORMAL HIGH (ref 8–23)
CO2: 22 mmol/L (ref 22–32)
Calcium: 8.7 mg/dL — ABNORMAL LOW (ref 8.9–10.3)
Chloride: 102 mmol/L (ref 98–111)
Creatinine, Ser: 1.78 mg/dL — ABNORMAL HIGH (ref 0.44–1.00)
GFR, Estimated: 31 mL/min — ABNORMAL LOW (ref >60)
Glucose, Bld: 402 mg/dL — ABNORMAL HIGH (ref 70–99)
Potassium: 4.2 mmol/L (ref 3.5–5.1)
Sodium: 136 mmol/L (ref 135–145)
Total Bilirubin: 0.5 mg/dL (ref 0.3–1.2)
Total Protein: 6.6 g/dL (ref 6.5–8.1)

## 2021-12-27 LAB — SURGICAL PCR SCREEN
MRSA, PCR: NEGATIVE
Staphylococcus aureus: NEGATIVE

## 2021-12-27 LAB — HEMOGLOBIN A1C
Hgb A1c MFr Bld: 9.6 % — ABNORMAL HIGH (ref 4.8–5.6)
Mean Plasma Glucose: 228.82 mg/dL

## 2021-12-27 LAB — PROTIME-INR
INR: 1.3 — ABNORMAL HIGH (ref 0.8–1.2)
Prothrombin Time: 15.7 seconds — ABNORMAL HIGH (ref 11.4–15.2)

## 2021-12-27 NOTE — Progress Notes (Signed)
Pt given CHG soap and instructions at TAVR Lab Appointment. All questions answered.  Kathyrn Drown, NP made aware of the following abnormal labs:  A1c 9.6  Hgb 8.3  PT 15.7 Glucose 402  Creatinine 1.78

## 2021-12-28 DIAGNOSIS — E1142 Type 2 diabetes mellitus with diabetic polyneuropathy: Secondary | ICD-10-CM | POA: Diagnosis not present

## 2021-12-28 LAB — SARS CORONAVIRUS 2 (TAT 6-24 HRS): SARS Coronavirus 2: NEGATIVE

## 2021-12-30 MED ORDER — MAGNESIUM SULFATE 50 % IJ SOLN
40.0000 meq | INTRAMUSCULAR | Status: DC
  Filled 2021-12-30: qty 9.85

## 2021-12-30 MED ORDER — CEFAZOLIN SODIUM-DEXTROSE 2-4 GM/100ML-% IV SOLN
2.0000 g | INTRAVENOUS | Status: AC
  Administered 2021-12-31: 2 g via INTRAVENOUS
  Filled 2021-12-30 (×2): qty 100

## 2021-12-30 MED ORDER — NOREPINEPHRINE 4 MG/250ML-% IV SOLN
0.0000 ug/min | INTRAVENOUS | Status: AC
  Administered 2021-12-31: 2 ug/min via INTRAVENOUS
  Filled 2021-12-30: qty 250

## 2021-12-30 MED ORDER — POTASSIUM CHLORIDE 2 MEQ/ML IV SOLN
80.0000 meq | INTRAVENOUS | Status: DC
  Filled 2021-12-30: qty 40

## 2021-12-30 MED ORDER — DEXMEDETOMIDINE HCL IN NACL 400 MCG/100ML IV SOLN
0.1000 ug/kg/h | INTRAVENOUS | Status: AC
  Administered 2021-12-31: 1 ug/kg/h via INTRAVENOUS
  Filled 2021-12-30: qty 100

## 2021-12-30 MED ORDER — HEPARIN 30,000 UNITS/1000 ML (OHS) CELLSAVER SOLUTION
Status: DC
  Filled 2021-12-30: qty 1000

## 2021-12-30 NOTE — H&P (Signed)
[address]       [place] [address]             [phone]      Cardiothoracic Surgery Admission History and Physical   PCP is [name], MD Referring Provider is Sherren Mocha, MD Primary Cardiologist is Minus Breeding, MD   Reason for admission:  Severe aortic stenosis   HPI:   [name] a 66 year old woman with history of hypertension, hyperlipidemia, type 2 diabetes, stage III chronic kidney disease, coronary artery disease status post CABG 2009, bilateral below-knee amputation of the right leg in 2008 and left leg in 2015, and chronic anemia who has been followed for aortic stenosis with serial echocardiograms.  She was hospitalized with respiratory failure in December 2022 and found to be COVID-positive with elevated troponin consistent with demand ischemia.  She was then hospitalized again in February 2023 with syncope and hypotension.  She ultimately underwent a TEE showing partial fusion of the right and left coronary cusps with a mean gradient 28 mmHg and dimensionless index of 0.23.  Aortic valve area was calculated at 0.73 cm.  It was felt that she most likely had severe low-flow/low gradient aortic stenosis.   She is in a wheelchair but has bilateral lower extremity prostheses on and ambulates at home.  She is independent and able to take care of her own ADLs.       Past Medical History:  Diagnosis Date   Anemia, normocytic normochromic 03/14/2008   Arthritis      "hands"   CAD (coronary artery disease) of artery bypass graft 03/14/2008   Chronic kidney disease (CKD) 12/26/2015    stage 3 due to type 2 diabetes mellitus   Chronic pain syndrome 09/25/2015   Coronary artery disease involving native coronary artery without angina pectoris 09/25/2015   Essential hypertension 03/14/2008   Gastroesophageal reflux disease with esophagitis 07/09/2016   Generalized anxiety disorder with panic attacks      History of one panic attack    GERD (gastroesophageal reflux disease)      takes Zantac   Hyperlipidemia, unspecified 03/14/2008   Localization-related symptomatic epilepsy and epileptic syndromes with complex partial seizures, not intractable, without status epilepticus 06/27/2014   Major depressive disorder 03/14/2008   Neuropathy     Osteomyelitis of ankle or foot, left, acute 06/17/2013   Peripheral vascular disease     PVD (peripheral vascular disease) 03/25/2012   Type 2 diabetes mellitus with diabetic mononeuropathy, with long-term current use of insulin 03/14/2008           Past Surgical History:  Procedure Laterality Date   AMPUTATION Left 08/22/2013    Procedure: LEFT BELOW KNEE AMPUTATION;  Surgeon: Newt Minion, MD;  Location: [facility];  Service: Orthopedics;  Laterality: Left;   ANKLE FUSION Left 05/11/2013    TIBIOCALCANEAL FUSION    ANKLE FUSION Left 05/12/2013    Procedure: LEFT TIBIOCALCANEAL FUSION;  Surgeon: Newt Minion, MD;  Location: [facility];  Service: Orthopedics;  Laterality: Left;  Left Tibiocalcaneal Fusion   APPLICATION OF WOUND VAC   06/17/2013    Procedure: APPLICATION OF WOUND VAC;  Surgeon: Newt Minion, MD;  Location: [facility];  Service: Orthopedics;;   BELOW KNEE LEG AMPUTATION Right Jan. 2008   BUBBLE STUDY   07/31/2021    Procedure: BUBBLE STUDY;  Surgeon: Geralynn Rile, MD;  Location: [facility];  Service: Cardiovascular;;   CAROTID ENDARTERECTOMY Right  CATARACT EXTRACTION Left 12/25/14   CHOLECYSTECTOMY       COLONOSCOPY       COLONOSCOPY N/A 04/24/2021    Procedure: COLONOSCOPY;  Surgeon: Toledo, Benay Pike, MD;  Location: [facility];  Service: Gastroenterology;  Laterality: N/A;   CORONARY ARTERY BYPASS GRAFT   12/01/2007    "CABG X4" (05/12/2013)   DILATION AND CURETTAGE OF UTERUS       I & D EXTREMITY Left 06/17/2013    Procedure: IRRIGATION AND DEBRIDEMENT EXTREMITY, PLACEMENT ANTIBIOTIC STIMULAN BEADS;  Surgeon: Newt Minion, MD;  Location: [facility];  Service:  Orthopedics;  Laterality: Left;   INTRAMEDULLARY (IM) NAIL INTERTROCHANTERIC Left 08/20/2018    Procedure: INTRAMEDULLARY (IM) NAIL INTERTROCHANTRIC;  Surgeon: Shona Needles, MD;  Location: [facility];  Service: Orthopedics;  Laterality: Left;   IRRIGATION AND DEBRIDEMENT ABSCESS Left 06/17/2013    ANKLE           DR [name]   RIGHT/LEFT HEART CATH AND CORONARY/GRAFT ANGIOGRAPHY N/A 10/07/2021    Procedure: RIGHT/LEFT HEART CATH AND CORONARY/GRAFT ANGIOGRAPHY;  Surgeon: Sherren Mocha, MD;  Location: [facility];  Service: Cardiovascular;  Laterality: N/A;   TEE WITHOUT CARDIOVERSION N/A 07/31/2021    Procedure: TRANSESOPHAGEAL ECHOCARDIOGRAM (TEE);  Surgeon: Geralynn Rile, MD;  Location: [facility];  Service: Cardiovascular;  Laterality: N/A;   TOE AMPUTATION Right      "took off a couple toes before the amputation"   TONSILLECTOMY               Family History  Problem Relation Age of Onset   Cancer Mother     Heart disease Father     Diabetes Father     Hyperlipidemia Father     Hypertension Father     Alzheimer's disease Maternal Grandfather        Social History         Socioeconomic History   Marital status: Married      Spouse name: Not on file   Number of children: 2   Years of education: 14   Highest education level: Associate degree: occupational, Hotel manager, or vocational program  Occupational History   Occupation: Disability  Tobacco Use   Smoking status: Never   Smokeless tobacco: Never  Vaping Use   Vaping Use: Never used  Substance and Sexual Activity   Alcohol use: No   Drug use: No   Sexual activity: Not Currently  Other Topics Concern   Not on file  Social History Narrative    Right handed     Lives in a one story home    Drinks caffeine     Social Determinants of Health    Financial Resource Strain: Not on file  Food Insecurity: Not on file  Transportation Needs: Not on file  Physical Activity: Not on file  Stress: Not on file  Social  Connections: Not on file  Intimate Partner Violence: Not on file             Prior to Admission medications   Medication Sig Start Date End Date Taking? Authorizing Provider  acetaminophen-codeine (TYLENOL #3) 300-30 MG tablet Take 1 tablet by mouth every 6 (six) hours as needed for severe pain. 04/26/21   Yes [provider]  albuterol (VENTOLIN HFA) 108 (90 Base) MCG/ACT inhaler Inhale 1-2 puffs into the lungs every 6 (six) hours as needed for wheezing or shortness of breath.  05/13/17   Yes [provider]  ALPRAZolam Duanne Moron) 0.5 MG  tablet Take 0.5 mg by mouth 2 (two) times daily as needed for anxiety. 09/06/19   Yes [provider]  aspirin 81 MG tablet Take 1 tablet (81 mg total) by mouth daily. 05/13/17   Yes Libby Maw, MD  carvedilol (COREG) 6.25 MG tablet Take 1 tablet (6.25 mg total) by mouth 2 (two) times daily. 02/06/21   Yes Minus Breeding, MD  clopidogrel (PLAVIX) 75 MG tablet Take 1 tablet (75 mg total) by mouth daily. 05/13/17   Yes Libby Maw, MD  cycloSPORINE (RESTASIS) 0.05 % ophthalmic emulsion Place 1 drop into both eyes 2 (two) times daily as needed (dry eyes).     Yes [provider]  diclofenac Sodium (VOLTAREN) 1 % GEL Apply 1 application. topically 4 (four) times daily as needed (pain).     Yes [provider]  ezetimibe (ZETIA) 10 MG tablet Take 10 mg by mouth daily.     Yes [provider]  fluconazole (DIFLUCAN) 150 MG tablet Take 1 tablet by mouth 2 (two) times daily. 10/22/21   Yes [provider]  HUMALOG KWIKPEN 100 UNIT/ML KwikPen Inject 12 Units into the skin 2 (two) times daily with a meal. 09/22/21   Yes [provider]  JARDIANCE 25 MG TABS tablet Take 25 mg by mouth daily. 09/25/21   Yes [provider]  LANTUS SOLOSTAR 100 UNIT/ML Solostar Pen Inject 38 Units into the skin at bedtime. 07/13/18   Yes [provider]  Levetiracetam 750 MG TB24 Take 2  tablets (1,500 mg total) by mouth at bedtime. 04/08/21   Yes Cameron Sprang, MD  lidocaine (LIDODERM) 5 % Place 1 patch onto the skin daily as needed (pain). 07/16/18   Yes [provider]  losartan (COZAAR) 50 MG tablet Take 50 mg by mouth daily.     Yes [provider]  Multiple Vitamins-Minerals (MULTIVITAMINS THER. W/MINERALS) TABS Take 1 tablet by mouth daily.       Yes [provider]  mupirocin ointment (BACTROBAN) 2 % Apply 1 Application topically 3 (three) times daily. 07/31/21   Yes [provider]  nitroGLYCERIN (NITROSTAT) 0.4 MG SL tablet Place 0.4 mg under the tongue every 5 (five) minutes as needed for chest pain.     Yes [provider]  pioglitazone (ACTOS) 15 MG tablet Take 15 mg by mouth daily. 07/09/16   Yes [provider]  pregabalin (LYRICA) 50 MG capsule TAKE 1 CAPSULE BY MOUTH EVERY NIGHT 08/01/21   Yes Cameron Sprang, MD  rosuvastatin (CRESTOR) 40 MG tablet Take 40 mg by mouth daily.     Yes [provider]  sertraline (ZOLOFT) 100 MG tablet Take 1 tablet (100 mg total) by mouth 2 (two) times daily. 05/13/17   Yes Libby Maw, MD  torsemide (DEMADEX) 20 MG tablet Take 20 mg by mouth daily. TAKE EXTRA TABLET AS NEEDED     Yes [provider]            Current Outpatient Medications  Medication Sig Dispense Refill   acetaminophen-codeine (TYLENOL #3) 300-30 MG tablet Take 1 tablet by mouth every 6 (six) hours as needed for severe pain.       albuterol (VENTOLIN HFA) 108 (90 Base) MCG/ACT inhaler Inhale 1-2 puffs into the lungs every 6 (six) hours as needed for wheezing or shortness of breath.        ALPRAZolam (XANAX) 0.5 MG tablet Take 0.5 mg by mouth 2 (two) times daily  as needed for anxiety.       aspirin 81 MG tablet Take 1 tablet (81 mg total) by mouth daily. 90 tablet 0   carvedilol (COREG) 6.25 MG tablet Take 1 tablet (6.25 mg total) by mouth 2 (two) times daily. 180 tablet 3    clopidogrel (PLAVIX) 75 MG tablet Take 1 tablet (75 mg total) by mouth daily. 90 tablet 0   cycloSPORINE (RESTASIS) 0.05 % ophthalmic emulsion Place 1 drop into both eyes 2 (two) times daily as needed (dry eyes).       diclofenac Sodium (VOLTAREN) 1 % GEL Apply 1 application. topically 4 (four) times daily as needed (pain).       ezetimibe (ZETIA) 10 MG tablet Take 10 mg by mouth daily.       fluconazole (DIFLUCAN) 150 MG tablet Take 1 tablet by mouth 2 (two) times daily.       HUMALOG KWIKPEN 100 UNIT/ML KwikPen Inject 12 Units into the skin 2 (two) times daily with a meal.       JARDIANCE 25 MG TABS tablet Take 25 mg by mouth daily.       LANTUS SOLOSTAR 100 UNIT/ML Solostar Pen Inject 38 Units into the skin at bedtime.       Levetiracetam 750 MG TB24 Take 2 tablets (1,500 mg total) by mouth at bedtime. 60 tablet 8   lidocaine (LIDODERM) 5 % Place 1 patch onto the skin daily as needed (pain).       losartan (COZAAR) 50 MG tablet Take 50 mg by mouth daily.       Multiple Vitamins-Minerals (MULTIVITAMINS THER. W/MINERALS) TABS Take 1 tablet by mouth daily.         mupirocin ointment (BACTROBAN) 2 % Apply 1 Application topically 3 (three) times daily.       nitroGLYCERIN (NITROSTAT) 0.4 MG SL tablet Place 0.4 mg under the tongue every 5 (five) minutes as needed for chest pain.       pioglitazone (ACTOS) 15 MG tablet Take 15 mg by mouth daily.       pregabalin (LYRICA) 50 MG capsule TAKE 1 CAPSULE BY MOUTH EVERY NIGHT 90 capsule 2   rosuvastatin (CRESTOR) 40 MG tablet Take 40 mg by mouth daily.       sertraline (ZOLOFT) 100 MG tablet Take 1 tablet (100 mg total) by mouth 2 (two) times daily. 180 tablet 0   torsemide (DEMADEX) 20 MG tablet Take 20 mg by mouth daily. TAKE EXTRA TABLET AS NEEDED        No current facility-administered medications for this visit.           Allergies  Allergen Reactions   Metformin Hcl Nausea And Vomiting   Ace Inhibitors Cough   Actos [Pioglitazone] Swelling    Atorvastatin Other (See Comments)      myalgias   Candesartan Cilexetil Other (See Comments)      Rash "cant recall"    Gabapentin Other (See Comments)      Dizziness and unbalanced   Influenza Vaccines        nearly killed me   Isosorbide Nitrate Other (See Comments)      HA.   Keflex [Cephalexin]         stomach upset   Quinapril Hcl Cough   Tramadol Hcl Other (See Comments)      seziure   Tape Rash      Redness, rash, itchiness            Review of  Systems:               General:                      + decreased appetite, + decreased energy, no weight gain, no weight loss, no fever             Cardiac:                       no chest pain with exertion, no chest pain at rest, + SOB with moderate exertion, no resting SOB, no PND, no orthopnea, no palpitations, no arrhythmia, no atrial fibrillation, no LE edema, + dizzy spells, no syncope             Respiratory:                 + exertional shortness of breath, no home oxygen, no productive cough, no dry cough, no bronchitis, no wheezing, no hemoptysis, no asthma, no pain with inspiration or cough, no sleep apnea, no CPAP at night             GI:                               no difficulty + frequency, no urinary tract infection, no hematuria, no enlarged prostate, no kidney stones, no kidney disease             Vascular:                     no pain suggestive of claudication, no pain in feet, no leg cramps, no varicose veins, no DVT, no non-healing foot ulcer             Neuro:                        stroke, no TIA's, no seizures, no headaches, no temporary blindness one eye,  no slurred speech, no peripheral neuropathy, no chronic pain, no instability of gait, no memory/cognitive dysfunction             Musculoskeletal:         no arthritis - primarily, no joint swelling, no myalgias, no difficulty walking, normal mobility              Skin:                           no rash, no itching, no skin infections, no pressure sores or  ulcerations             Psych:                         + anxiety, + depression, + nervousness, + unusual recent stress             Eyes:                           no blurry vision, no floaters, no recent vision changes, + wears glasses or contacts             ENT:                            no hearing loss, no  loose or painful teeth, no dentures, last saw dentist 6 months ago             Hematologic:               no easy bruising, no abnormal bleeding, no clotting disorder, no frequent epistaxis             Endocrine:                   + diabetes, does not check CBG's at home+                            Physical Exam:               BP (!) 125/49   Pulse 80   Resp 20 Comment: RA  Ht '5\' 5"'$  (1.651 m)   Wt 167 lb (75.8 kg)   BMI 27.79 kg/m              General:                      well-appearing             HEENT:                       Unremarkable, NCAT, PERLA, EOMI             Neck:                           no JVD, no bruits, no adenopathy              Chest:                          clear to auscultation, symmetrical breath sounds, no wheezes, no rhonchi              CV:                              RRR, 3/6 systolic murmur RSB, no diastolic murmr             Abdomen:                    soft, non-tender, no masses              Extremities:                 warm, well-perfused, pulses palpable in ankles, no lower extremity edema             Rectal/GU                   Deferred             Neuro:                         Grossly non-focal and symmetrical throughout             Skin:                            Clean and dry, no rashes, no breakdown   Diagnostic Tests:     TRANSESOPHOGEAL ECHO REPORT  Patient Name:   VERSIA MIGNOGNA Date of Exam: 07/31/2021  Medical Rec #:  [id number]        Height:       65.0 in  Accession #:    [id number]       Weight:       176.0 lb  Date of Birth:  May 13, 1955         BSA:          1.874 m  Patient Age:    37 years         BP:            84/40 mmHg  Patient Gender: F                HR:           91 bpm.  Exam Location:  Inpatient   Procedure: Transesophageal Echo, Cardiac Doppler, Color Doppler, 3D Echo  and             Saline Contrast Bubble Study   Indications:     Aortic stenosis     History:         Patient has prior history of Echocardiogram examinations,  most                   recent 04/22/2021. CHF, CAD and Previous Myocardial  Infarction,                   Aortic Valve Disease and Mitral Valve Disease; Risk                   Factors:Hypertension. HFrEF. PVD.     Sonographer:     Clayton Lefort RDCS (AE)  Referring Phys:  [id number] Gibson  Diagnosing Phys: Eleonore Chiquito MD   PROCEDURE: After discussion of the risks and benefits of a TEE, an  informed consent was obtained from the patient. TEE procedure time was 26  minutes. The transesophogeal probe was passed without difficulty through  the esophogus of the patient. Imaged  were obtained with the patient in a left lateral decubitus position.  Sedation performed by different physician. The patient was monitored while  under deep sedation. Anesthestetic sedation was provided intravenously by  Anesthesiology: 301.'74mg'$  of  Propofol, '80mg'$  of Lidocaine. Image quality was excellent. The patient's  vital signs; including heart rate, blood pressure, and oxygen saturation;  remained stable throughout the procedure. The patient developed no  complications during the procedure.   IMPRESSIONS     1. Tricuspid AoV with severe calcifications. There is partial fusion of  the RCC/LCC which could be acquired. Vmax 3.52 m/s, MG 28 mmHG, AVA 0.73  cm2, DI 0.23. Aortic stenosis is severe. Direct 3D planimetry 0.87 cm2.  Overall, suspect low flow low  gradient AS. CW jet is rounded consistent with severe AS as well. The  aortic valve is tricuspid. There is severe calcifcation of the aortic  valve. There is severe thickening of the aortic valve. Aortic valve   regurgitation is not visualized.   2. Degnerative mitral valve disease. Moderate mitral regurgitation  secondary to restricted PMVL from calcifications (IIIB). 2D PISA radius  0.7 cm, ERO 0.23 cm2, R vol 39 cc, RF 38%. 3D VCA 0.24 cm2. PV flow is  systolic dominant or blunted in all veins.  Mild mitral stenosis is present. MG 6.0 mmHG @ 82 bpm. MVA by 3D  planimetry 1.97 cm2. The mitral valve is degenerative. Moderate mitral  valve regurgitation.  Mild mitral stenosis. Moderate to severe mitral  annular calcification.   3. Left ventricular ejection fraction, by estimation, is 40 to 45%. The  left ventricle has mildly decreased function. The left ventricle  demonstrates global hypokinesis.   4. Right ventricular systolic function is normal. The right ventricular  size is mildly enlarged.   5. Left atrial size was mild to moderately dilated. No left atrial/left  atrial appendage thrombus was detected. The LAA emptying velocity was 54  cm/s.   6. Right atrial size was mildly dilated.   7. There is mild (Grade II) layered plaque involving the descending  aorta.   8. Agitated saline contrast bubble study was negative, with no evidence  of any interatrial shunt.   FINDINGS   Left Ventricle: Left ventricular ejection fraction, by estimation, is 40  to 45%. The left ventricle has mildly decreased function. The left  ventricle demonstrates global hypokinesis. The left ventricular internal  cavity size was normal in size.   Right Ventricle: The right ventricular size is mildly enlarged. No  increase in right ventricular wall thickness. Right ventricular systolic  function is normal.   Left Atrium: Left atrial size was mild to moderately dilated. No left  atrial/left atrial appendage thrombus was detected. The LAA emptying  velocity was 54 cm/s.   Right Atrium: Right atrial size was mildly dilated.   Pericardium: There is no evidence of pericardial effusion.   Mitral Valve: Degnerative  mitral valve disease. Moderate mitral  regurgitation secondary to restricted PMVL from calcifications (IIIB). 2D  PISA radius 0.7 cm, ERO 0.23 cm2, R vol 39 cc, RF 38%. 3D VCA 0.24 cm2. PV  flow is systolic dominant or blunted in  all veins. Mild mitral stenosis is present. MG 6.0 mmHG @ 82 bpm. MVA by  3D planimetry 1.97 cm2. The mitral valve is degenerative in appearance.  Moderate to severe mitral annular calcification. Moderate mitral valve  regurgitation. Mild mitral valve  stenosis. MV peak gradient, 9.4 mmHg. The mean mitral valve gradient is  6.0 mmHg with average heart rate of 82 bpm.   Tricuspid Valve: The tricuspid valve is grossly normal. Tricuspid valve  regurgitation is mild . No evidence of tricuspid stenosis.   Aortic Valve: Tricuspid AoV with severe calcifications. There is partial  fusion of the RCC/LCC which could be acquired. Vmax 3.52 m/s, MG 28 mmHG,  AVA 0.73 cm2, DI 0.23. Aortic stenosis is severe. Direct 3D planimetry  0.87 cm2. Overall, suspect low flow  low gradient AS. CW jet is rounded consistent with severe AS as well. The  aortic valve is tricuspid. There is severe calcifcation of the aortic  valve. There is severe thickening of the aortic valve. Aortic valve  regurgitation is not visualized. Aortic  valve mean gradient measures 28.3 mmHg. Aortic valve peak gradient  measures 49.7 mmHg. Aortic valve area, by VTI measures 0.73 cm.   Pulmonic Valve: The pulmonic valve was grossly normal. Pulmonic valve  regurgitation is not visualized. No evidence of pulmonic stenosis.   Aorta: The aortic root and ascending aorta are structurally normal, with  no evidence of dilitation. There is mild (Grade II) layered plaque  involving the descending aorta.   Venous: The left upper pulmonary vein, left lower pulmonary vein, right  lower pulmonary vein and right upper pulmonary vein are normal.   IAS/Shunts: The atrial septum is grossly normal. Agitated saline contrast   was given intravenously to evaluate for intracardiac shunting. Agitated  saline contrast bubble study was negative,  with no evidence of any  interatrial shunt.      LEFT VENTRICLE  PLAX 2D  LVOT diam:     2.00 cm  LV SV:         63  LV SV Index:   34  LVOT Area:     3.14 cm      AORTIC VALVE  AV Area (Vmax):    0.84 cm  AV Area (Vmean):   0.83 cm  AV Area (VTI):     0.73 cm  AV Vmax:           352.36 cm/s  AV Vmean:          236.435 cm/s  AV VTI:            0.867 m  AV Peak Grad:      49.7 mmHg  AV Mean Grad:      28.3 mmHg  LVOT Vmax:         94.10 cm/s  LVOT Vmean:        62.100 cm/s  LVOT VTI:          0.202 m  LVOT/AV VTI ratio: 0.23     AORTA  Ao Root diam: 2.41 cm  Ao Asc diam:  3.00 cm   MITRAL VALVE  MV Peak grad: 9.4 mmHg        SHUNTS  MV Mean grad: 6.0 mmHg        Systemic VTI:  0.20 m  MV Vmax:      1.53 m/s        Systemic Diam: 2.00 cm  MV Vmean:     123.0 cm/s  MR Peak grad:    117.1 mmHg  MR Mean grad:    73.0 mmHg  MR Vmax:         541.00 cm/s  MR Vmean:        393.0 cm/s  MR PISA:         3.08 cm  MR PISA Eff ROA: 23 mm  MR PISA Radius:  0.70 cm   Eleonore Chiquito MD  Electronically signed by Eleonore Chiquito MD  Signature Date/Time: 07/31/2021/2:32:05 PM         Final (Updated)       Physicians   Panel Physicians Referring Physician Case Authorizing Physician  Sherren Mocha, MD (Primary)        Procedures   RIGHT/LEFT HEART CATH AND CORONARY/GRAFT ANGIOGRAPHY    Conclusion   1.  Severe native three-vessel coronary artery disease with total occlusion of the LAD, left circumflex, and right coronary arteries 2.  Status post CABG with continued patency of the LIMA to LAD and free left radial graft OM.  Total occlusion of the SVG to RCA and SVG to diagonal. 3.  Extensive collateralization of the distal RCA branches from left to right collaterals 4.  Moderate aortic stenosis with mean gradient 25 mmHg, peak to peak gradient 31 mmHg,  calculated aortic valve area 1.4 cm  Recommendations: We will review her case with the multidisciplinary heart valve team.  Her aortic stenosis has appeared to be severe, low-flow low gradient paradoxical AAS (stage D3) by echo assessment.  With her multiple hospitalizations and comorbid conditions, should consider continuing on with TAVR evaluation.  Vascular access may be an issue based on today's evaluation.   Indications   Nonrheumatic aortic valve stenosis I35.0 (ICD-10-CM)    Procedural Details   Technical Details INDICATION: 66 year old woman with multiple medical problems presents for right  and left heart catheterization as part of an evaluation for treatment of her aortic stenosis.  She has had bilateral BKA surgeries as related to complications from diabetes.  She has known CAD and underwent multivessel CABG in 2009.  She has developed progressive symptoms of severe aortic stenosis with multiple hospitalizations for heart failure over the past year.  She also has chronic iron deficiency anemia and stage IIIb chronic kidney disease.  Presenting now for right and left heart catheterization.  PROCEDURAL DETAILS: The right groin is imaged with ultrasound and the femoral artery does not appear to be patent.  Attention is turned to the left groin. The groin is prepped, draped, and anesthetized with 1% lidocaine. Using direct ultrasound guidance, and a micropuncture technique, a 5 French sheath is placed in the right femoral artery and a 7 French sheath is placed in the right femoral vein. [place] images are captured and stored in the patient's chart. A Swan-Ganz catheter is used for the right heart catheterization. Standard protocol is followed for recording of right heart pressures and sampling of oxygen saturations. Fick cardiac output is calculated. Standard Judkins catheters are used for selective coronary angiography and bypass graft angiography. LV pressure is recorded and an aortic valve pullback  gradient is recorded.  The aortic valve was crossed with a J-wire.  There are no immediate procedural complications. [name] transferred to the post catheterization recovery area for further monitoring.      Estimated blood loss <50 mL.   During this procedure medications were administered to achieve and maintain moderate conscious sedation while the patient's heart rate, blood pressure, and oxygen saturation were continuously monitored and I was present face-to-face 100% of this time.    Medications (Filter: Administrations occurring from 1216 to 1326 on 10/07/21)  important  Continuous medications are totaled by the amount administered until 10/07/21 1326.    fentaNYL (SUBLIMAZE) injection (mcg) Total dose:  50 mcg  Date/Time Rate/Dose/Volume Action    10/07/21 1233 25 mcg Given    1244 25 mcg Given      midazolam (VERSED) injection (mg) Total dose:  2 mg  Date/Time Rate/Dose/Volume Action    10/07/21 1233 1 mg Given    1244 1 mg Given      lidocaine (PF) (XYLOCAINE) 1 % injection (mL) Total volume:  5 mL  Date/Time Rate/Dose/Volume Action    10/07/21 Canceled Entry    1252 5 mL Given      Heparin (Porcine) in NaCl 1000-0.9 UT/500ML-% SOLN (mL) Total volume:  1,000 mL  Date/Time Rate/Dose/Volume Action    10/07/21 1244 500 mL Given    1244 500 mL Given      iohexol (OMNIPAQUE) 350 MG/ML injection (mL) Total volume:  45 mL  Date/Time Rate/Dose/Volume Action    10/07/21 1325 45 mL Given      Sedation Time   Sedation Time Physician-1: 41 minutes 8 seconds Contrast   Medication Name Total Dose  iohexol (OMNIPAQUE) 350 MG/ML injection 45 mL    Radiation/Fluoro   Fluoro time: 7.4 (min) DAP: 19403 (mGycm2) Cumulative Air Kerma: 357 (mGy) Complications      Complications documented before study signed (10/07/2021  0:17 PM)     No complications were associated with this study.  Documented by Bethann Punches, RN - 10/07/2021  1:26 PM      Coronary  Findings   Diagnostic Dominance: Right Left Main  Ost LM to Dist LM lesion is 50% stenosed. The lesion is calcified.  Left Anterior Descending  Prox LAD to Mid LAD lesion is 100% stenosed.    Left Circumflex  Prox Cx lesion is 100% stenosed.    Right Coronary Artery  Ost RCA to Prox RCA lesion is 100% stenosed. The lesion is calcified.    Right Posterior Descending Artery  Collaterals  RPDA filled by collaterals from 1st Sept.     Collaterals  RPDA filled by collaterals from Dist LAD.       LIMA LIMA Graft To Mid LAD  LIMA graft was visualized by angiography and is large. The graft exhibits no disease. The LIMA to LAD graft is widely patent    Graft To 1st Diag  The saphenous vein graft to diagonal is totally occluded  Origin to Prox Graft lesion is 100% stenosed.    Graft To Dist RCA  The saphenous vein graft to distal RCA is totally occluded  Origin lesion is 100% stenosed.    Left Radial Artery Graft To 1st Mrg  Left radial artery graft was visualized by angiography and is normal in caliber. The graft exhibits no disease. The left radial graft to obtuse marginal is patent with no significant stenosis. There is some pressure dampening with the catheter engaged in the graft.    Intervention    No interventions have been documented.    Left Heart   Aortic Valve There is moderate aortic valve stenosis. The aortic valve is calcified. There is restricted aortic valve motion.    Coronary Diagrams   Diagnostic Dominance: Right  Intervention   Implants      No implant documentation for this case.    Syngo Images    Show images for CARDIAC CATHETERIZATION Images on Long Term Storage    Show images for Hanya, Guerin to Procedure Log   Procedure Log    Hemo Data   Flowsheet Row Most Recent Value  Fick Cardiac Output 8.26 L/min  Fick Cardiac Output Index 4.48 (L/min)/BSA  Aortic Mean Gradient 24.79 mmHg  Aortic Peak Gradient 30.5 mmHg  Aortic  Valve Area 1.40  Aortic Value Area Index 0.76 cm2/BSA  RA A Wave 9 mmHg  RA V Wave 12 mmHg  RA Mean 7 mmHg  RV Systolic Pressure 52 mmHg  RV Diastolic Pressure 4 mmHg  RV EDP 11 mmHg  PA Systolic Pressure 55 mmHg  PA Diastolic Pressure 27 mmHg  PA Mean 40 mmHg  PW A Wave 23 mmHg  PW V Wave 19 mmHg  PW Mean 18 mmHg  AO Systolic Pressure 951 mmHg  AO Diastolic Pressure 47 mmHg  AO Mean 72 mmHg  LV Systolic Pressure 884 mmHg  LV Diastolic Pressure 19 mmHg  LV EDP 20 mmHg  AOp Systolic Pressure 166 mmHg  AOp Diastolic Pressure 38 mmHg  AOp Mean Pressure 77 mmHg  LVp Systolic Pressure 063 mmHg  LVp Diastolic Pressure 5 mmHg  LVp EDP Pressure 18 mmHg  QP/QS 1  TPVR Index 6.92 HRUI      ADDENDUM REPORT: 11/22/2021 14:56   CLINICAL DATA:  Severe Aortic Stenosis.   EXAM: Cardiac TAVR CT   TECHNIQUE: A non-contrast, gated CT scan was obtained with axial slices of 3 mm through the heart for aortic valve calcium scoring. A 90 kV retrospective, gated, contrast cardiac scan was obtained. Gantry rotation speed was 250 msecs and collimation was 0.6 mm. Nitroglycerin was not given. The 3D data set was reconstructed in 5% intervals of the 0-95% of the R-R cycle. Systolic and diastolic  phases were analyzed on a dedicated workstation using MPR, MIP, and VRT modes. The patient received 100 cc of contrast.   FINDINGS: Image quality: Excellent.   Noise artifact is: Limited.   Valve Morphology: Tricuspid aortic valve with severe calcifications and severely restricted movement in systole. Appears to be acquired fusion of the RCC/LCC and these leaflets are immobile. Bulky calcification of the RCC/LCC.   Aortic Valve Calcium score:   Aortic annular dimension:   Phase assessed: 25%   Annular area: 349 mm2   Annular perimeter: 67.8 mm   Max diameter: 24.4 mm   Min diameter: 19.6 mm   Annular and subannular calcification: Minimal calcium under the [place].   Membranous septum  length: 7.9 mm   Optimal coplanar projection: LAO 9 CRA 23   Coronary Artery Height above Annulus:   Left Main: 12.6 mm   Right Coronary: 13.6 mm   Sinus of Valsalva Measurements:   Non-coronary: 24.0 mm   Right-coronary: 26.6 mm   Left-coronary: 24.4 mm   Sinus of Valsalva Height:   Non-coronary: 19.0 mm   Right-coronary: 17.3 mm   Left-coronary: 16.6 mm   Sinotubular Junction: 24 mm   Ascending Thoracic Aorta: 33 mm   Coronary Arteries: Normal coronary origin. Right dominance. The study was performed without use of NTG and is insufficient for plaque evaluation. Please refer to recent cardiac catheterization for coronary assessment. S/p CABG. Patent LIMA-LAD and graft to OM.   Cardiac Morphology:   Right Atrium: Right atrial size is within normal limits.   Right Ventricle: The right ventricular cavity is within normal limits.   Left Atrium: Left atrial size is normal in size with no left atrial appendage filling defect.   Left Ventricle: The ventricular cavity size is within normal limits.   Pulmonary arteries: Normal in size without proximal filling defect.   Pulmonary veins: Normal pulmonary venous drainage.   Pericardium: Normal thickness with no significant effusion or calcium present.   Mitral Valve: The mitral valve is degenerative with severe annular calcium.   Extra-cardiac findings: See attached radiology report for non-cardiac structures.   IMPRESSION: 1. 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   2. Annular measurements support a 23 mm S3 (349 mm2). Recommend against an evolut pro due to narrow aortic root.   3. Minimal annular calcium under the [place].   4. Sufficient coronary to annulus distance.   5. Optimal Fluoroscopic Angle for Delivery: LAO 9 CRA 23   Vigo T. Audie Box, MD     Electronically Signed   By: Eleonore Chiquito M.D.   On: 11/22/2021 14:56    Addended by Geralynn Rile, MD on 11/22/2021  5:26  PM    Study Result   Narrative & Impression  EXAM: OVER-READ INTERPRETATION  CT CHEST   The following report is a limited chest CT over-read performed by radiologist Dr. Yetta Glassman of [facility], [place] on 11/22/2021. This over-read does not include interpretation of cardiac or coronary anatomy or pathology. The cardiac TAVR interpretation by the cardiologist is attached.   COMPARISON:  None Available.   FINDINGS: Extracardiac findings will be described separately under dictation for contemporaneously obtained CTA chest, abdomen and pelvis.   IMPRESSION: Please see separate dictation for contemporaneously obtained CTA chest, abdomen and pelvis dated 11/22/2021 for full description of relevant extracardiac findings.   Electronically Signed: By: Yetta Glassman M.D. On: 11/22/2021 13:30        Narrative & Impression  CLINICAL DATA:  Aortic valve replacement preop evaluation   EXAM: CT ANGIOGRAPHY CHEST, ABDOMEN AND PELVIS   TECHNIQUE: Non-contrast CT of the chest was initially obtained.   Multidetector CT imaging through the chest, abdomen and pelvis was performed using the standard protocol during bolus administration of intravenous contrast. Multiplanar reconstructed images and MIPs were obtained and reviewed to evaluate the vascular anatomy.   RADIATION DOSE REDUCTION: This exam was performed according to the departmental dose-optimization program which includes automated exposure control, adjustment of the mA and/or kV according to patient size and/or use of iterative reconstruction technique.   CONTRAST:  16m OMNIPAQUE IOHEXOL 350 MG/ML SOLN   COMPARISON:  None Available.   FINDINGS: CTA CHEST FINDINGS   Cardiovascular: Normal heart size. No pericardial effusion. Aortic valve thickening and calcifications. Normal caliber thoracic aorta with mild atherosclerotic disease. Main and three-vessel coronary artery calcifications status post CABG.  Patent LIMA and vein grafts. Mitral annular calcifications. No suspicious filling defects of the central pulmonary arteries. Standard three-vessel aortic arch with no significant stenosis.   Mediastinum/Nodes: Esophagus and thyroid are unremarkable. Mildly enlarged mediastinal lymph nodes, likely reactive. Reference subcarinal lymph node measuring 1.4 cm in short axis on series 3, image 163.   Lungs/Pleura: Central airways are patent. Mild diffuse ground-glass opacities and smooth septal thickening. No consolidation, pleural effusion pneumothorax.   Musculoskeletal: Prior median sternotomy with intact sternal wires. No aggressive appearing osseous lesions.   CTA ABDOMEN AND PELVIS FINDINGS   Hepatobiliary: No focal liver abnormality is seen. Status post cholecystectomy. No biliary dilatation.   Pancreas: Unremarkable. No pancreatic ductal dilatation or surrounding inflammatory changes.   Spleen: Splenomegaly, measuring up to 16.5 cm.   Adrenals/Urinary Tract: Adrenal glands are unremarkable. Kidneys are normal, without renal calculi, focal lesion, or hydronephrosis. Bladder is unremarkable.   Stomach/Bowel: Stomach is within normal limits. Appendix appears normal. No evidence of bowel wall thickening, distention, or inflammatory changes.   Vascular/lymphatic: Normal caliber abdominal aorta with severe calcified plaque. Major branch vessels are patent. Moderate narrowing at the origin of the SMA and right renal artery due to calcified plaque. Moderate to severe narrowing at the origin of the IMA due to calcified plaque. No pathologically enlarged lymph nodes seen in the abdomen or pelvis.   Reproductive: Uterus and bilateral adnexa are unremarkable.   Other: No abdominal wall hernia or abnormality. No abdominopelvic ascites.   Musculoskeletal: Old right inferior ramus fracture. Intramedullary nail of the proximal left femur. No aggressive appearing osseous lesions.    VASCULAR MEASUREMENTS PERTINENT TO TAVR:   AORTA:   Minimal Aortic Diameter-7.2 mm   Severity of Aortic Calcification-severe   RIGHT PELVIS:   Right Common Iliac Artery -   Minimal Diameter-6.8 mm   Tortuosity-none   Calcification-mild   Right External Iliac Artery -   Minimal Diameter-5.7 mm   Tortuosity-mild   Calcification-mild   Right Common Femoral Artery -   Minimal Diameter-5.0 mm   Tortuosity-none   Calcification-mild   LEFT PELVIS:   Left Common Iliac Artery -   Minimal Diameter-6.1 mm   Tortuosity-none   Calcification-moderate   Left External Iliac Artery -   Minimal Diameter-6.0 mm   Tortuosity-mild   Calcification-mild   Left Common Femoral Artery -   Minimal Diameter-5.3 mm   Tortuosity-none   Calcification-moderate   Review of the MIP images confirms the above findings.   IMPRESSION: 1. Vascular findings and measurements pertinent to potential TAVR procedure, as detailed above. 2. Thickening and calcification of the aortic valve, compatible  with reported clinical history of aortic stenosis. 3. Severe aortoiliac atherosclerosis. Left main and 3 vessel coronary artery disease status post CABG. 4. Mild pulmonary edema. 5. Splenomegaly.     Electronically Signed   By: Yetta Glassman M.D.   On: 11/22/2021 13:29      Impression:   This 66 year old woman has stage D2, low-flow/low gradient severe aortic stenosis with NYHA class II symptoms of exertional fatigue and shortness of breath consistent with chronic diastolic congestive heart failure.  She has had multiple hospitalizations for congestive heart failure exacerbation over the past year.  I have personally reviewed her  echocardiogram, cardiac catheterization, and CTA studies.  Her echocardiogram shows a severely calcified and thickened aortic valve with restricted leaflet mobility.  The mean gradient is 28 mmHg with a valve area of 0.73 cm and dimensionless index of  0.23.  Cardiac catheterization shows severe native three-vessel coronary disease with total occlusion of the LAD, left circumflex, and right coronary arteries.  There is continued patency of the LIMA to the LAD and left radial artery graft to the OM.  There is total occlusion of the saphenous vein graft to the RCA and diagonal branch with extensive collateral flow to the right coronary artery from the left.  The mean gradient across the aortic valve was measured at 25 mmHg.  I agree that aortic valve replacement is indicated in this patient for relief of her symptoms and to prevent progressive left ventricular dysfunction.  Given her comorbidities of prior CABG, stage IIIb chronic kidney disease, diabetes, and bilateral lower extremity amputation I do not think she would be a candidate for open surgical aortic valve placement.  I think that transcatheter aortic valve replacement would be a reasonable alternative for her.  Her gated cardiac CTA shows anatomy suitable for TAVR using a SAPIEN 3 valve.  Her abdominal and pelvic CTA shows adequate pelvic vascular anatomy to allow transfemoral insertion.   The patient and her son were counseled at length regarding treatment alternatives for management of severe symptomatic aortic stenosis. The risks and benefits of surgical intervention has been discussed in detail. Long-term prognosis with medical therapy was discussed. Alternative approaches such as conventional surgical aortic valve replacement, transcatheter aortic valve replacement, and palliative medical therapy were compared and contrasted at length. This discussion was placed in the context of the patient's own specific clinical presentation and past medical history. All of their questions have been addressed.    Following the decision to proceed with transcatheter aortic valve replacement, a discussion was held regarding what types of management strategies would be attempted intraoperatively in the event of  life-threatening complications, including whether or not the patient would be considered a candidate for the use of cardiopulmonary bypass and/or conversion to open sternotomy for attempted surgical intervention.  I do not think she is a candidate for emergent sternotomy to manage any intraoperative complications.  [name] aware of the fact that transient use of cardiopulmonary bypass may be necessary. The patient has been advised of a variety of complications that might develop including but not limited to risks of death, stroke, paravalvular leak, aortic dissection or other major vascular complications, aortic annulus rupture, device embolization, cardiac rupture or perforation, mitral regurgitation, acute myocardial infarction, arrhythmia, heart block or bradycardia requiring permanent pacemaker placement, congestive heart failure, respiratory failure, renal failure, pneumonia, infection, other late complications related to structural valve deterioration or migration, or other complications that might ultimately cause a temporary or permanent loss of functional independence  or other long term morbidity. The patient provides full informed consent for the procedure as described and all questions were answered.    Plan:   Transfemoral TAVR using a SAPIEN 3 valve.      Gaye Pollack, MD

## 2021-12-30 NOTE — Anesthesia Preprocedure Evaluation (Signed)
Anesthesia Evaluation  Patient identified by MRN, date of birth, ID band Patient awake    Reviewed: Allergy & Precautions, NPO status , Patient's Chart, lab work & pertinent test results  Airway Mallampati: II  TM Distance: >3 FB Neck ROM: Full    Dental  (+) Missing   Pulmonary neg pulmonary ROS   Pulmonary exam normal        Cardiovascular hypertension, Pt. on home beta blockers and Pt. on medications + angina  + CAD, + Past MI, + CABG, + Peripheral Vascular Disease and +CHF  + Valvular Problems/Murmurs AS  Rhythm:Regular Rate:Normal + Systolic murmurs    Neuro/Psych Seizures -,  PSYCHIATRIC DISORDERS Anxiety Depression     Neuromuscular disease    GI/Hepatic negative GI ROS, Neg liver ROS,,,  Endo/Other  diabetes    Renal/GU Renal InsufficiencyRenal disease     Musculoskeletal  (+) Arthritis ,    Abdominal   Peds  Hematology  (+) Blood dyscrasia (On Plavix), anemia   Anesthesia Other Findings Severe Aortic Stenosis  Reproductive/Obstetrics                             Anesthesia Physical Anesthesia Plan  ASA: 4  Anesthesia Plan: MAC   Post-op Pain Management:    Induction: Intravenous  PONV Risk Score and Plan: 3 and Ondansetron, Dexamethasone, Midazolam and Treatment may vary due to age or medical condition  Airway Management Planned: Simple Face Mask  Additional Equipment: Arterial line  Intra-op Plan:   Post-operative Plan:   Informed Consent: I have reviewed the patients History and Physical, chart, labs and discussed the procedure including the risks, benefits and alternatives for the proposed anesthesia with the patient or authorized representative who has indicated his/her understanding and acceptance.     Dental advisory given  Plan Discussed with: CRNA  Anesthesia Plan Comments:        Anesthesia Quick Evaluation

## 2021-12-31 ENCOUNTER — Encounter (HOSPITAL_COMMUNITY): Payer: Self-pay | Admitting: Cardiovascular Disease

## 2021-12-31 ENCOUNTER — Inpatient Hospital Stay (HOSPITAL_COMMUNITY)
Admission: RE | Admit: 2021-12-31 | Discharge: 2022-01-02 | DRG: 266 | Disposition: A | Discharge status: Home / Self Care | Payer: Medicare Other | Attending: Cardiovascular Disease | Admitting: Cardiovascular Disease

## 2021-12-31 ENCOUNTER — Encounter (HOSPITAL_COMMUNITY)
Admission: RE | Disposition: A | Discharge status: Home / Self Care | Payer: Self-pay | Source: Home / Self Care | Attending: Cardiovascular Disease

## 2021-12-31 ENCOUNTER — Inpatient Hospital Stay (HOSPITAL_COMMUNITY): Payer: Medicare Other | Admitting: Vascular Surgery

## 2021-12-31 ENCOUNTER — Other Ambulatory Visit: Payer: Self-pay

## 2021-12-31 ENCOUNTER — Inpatient Hospital Stay (HOSPITAL_COMMUNITY): Payer: Medicare Other

## 2021-12-31 ENCOUNTER — Other Ambulatory Visit: Payer: Self-pay | Admitting: Physician Assistant

## 2021-12-31 ENCOUNTER — Inpatient Hospital Stay (HOSPITAL_COMMUNITY): Payer: Medicare Other | Admitting: Physician Assistant

## 2021-12-31 DIAGNOSIS — G40209 Localization-related (focal) (partial) symptomatic epilepsy and epileptic syndromes with complex partial seizures, not intractable, without status epilepticus: Secondary | ICD-10-CM | POA: Diagnosis present

## 2021-12-31 DIAGNOSIS — I5032 Chronic diastolic (congestive) heart failure: Secondary | ICD-10-CM | POA: Diagnosis present

## 2021-12-31 DIAGNOSIS — Z9889 Other specified postprocedural states: Secondary | ICD-10-CM

## 2021-12-31 DIAGNOSIS — I350 Nonrheumatic aortic (valve) stenosis: Principal | ICD-10-CM | POA: Diagnosis present

## 2021-12-31 DIAGNOSIS — R161 Splenomegaly, not elsewhere classified: Secondary | ICD-10-CM | POA: Diagnosis present

## 2021-12-31 DIAGNOSIS — I509 Heart failure, unspecified: Secondary | ICD-10-CM | POA: Diagnosis not present

## 2021-12-31 DIAGNOSIS — Z89512 Acquired absence of left leg below knee: Secondary | ICD-10-CM

## 2021-12-31 DIAGNOSIS — E1122 Type 2 diabetes mellitus with diabetic chronic kidney disease: Secondary | ICD-10-CM | POA: Diagnosis present

## 2021-12-31 DIAGNOSIS — N179 Acute kidney failure, unspecified: Secondary | ICD-10-CM | POA: Diagnosis not present

## 2021-12-31 DIAGNOSIS — Z952 Presence of prosthetic heart valve: Secondary | ICD-10-CM | POA: Diagnosis not present

## 2021-12-31 DIAGNOSIS — N189 Chronic kidney disease, unspecified: Secondary | ICD-10-CM | POA: Diagnosis present

## 2021-12-31 DIAGNOSIS — Z820 Family history of epilepsy and other diseases of the nervous system: Secondary | ICD-10-CM

## 2021-12-31 DIAGNOSIS — I110 Hypertensive heart disease with heart failure: Secondary | ICD-10-CM

## 2021-12-31 DIAGNOSIS — I130 Hypertensive heart and chronic kidney disease with heart failure and stage 1 through stage 4 chronic kidney disease, or unspecified chronic kidney disease: Secondary | ICD-10-CM | POA: Diagnosis present

## 2021-12-31 DIAGNOSIS — I252 Old myocardial infarction: Secondary | ICD-10-CM | POA: Diagnosis not present

## 2021-12-31 DIAGNOSIS — I25810 Atherosclerosis of coronary artery bypass graft(s) without angina pectoris: Secondary | ICD-10-CM | POA: Diagnosis not present

## 2021-12-31 DIAGNOSIS — Z89511 Acquired absence of right leg below knee: Secondary | ICD-10-CM | POA: Diagnosis not present

## 2021-12-31 DIAGNOSIS — E669 Obesity, unspecified: Secondary | ICD-10-CM | POA: Diagnosis present

## 2021-12-31 DIAGNOSIS — K219 Gastro-esophageal reflux disease without esophagitis: Secondary | ICD-10-CM | POA: Diagnosis present

## 2021-12-31 DIAGNOSIS — Z79899 Other long term (current) drug therapy: Secondary | ICD-10-CM

## 2021-12-31 DIAGNOSIS — Z7982 Long term (current) use of aspirin: Secondary | ICD-10-CM

## 2021-12-31 DIAGNOSIS — F410 Panic disorder [episodic paroxysmal anxiety] without agoraphobia: Secondary | ICD-10-CM | POA: Diagnosis present

## 2021-12-31 DIAGNOSIS — I700 Atherosclerosis of aorta: Secondary | ICD-10-CM | POA: Diagnosis present

## 2021-12-31 DIAGNOSIS — E1165 Type 2 diabetes mellitus with hyperglycemia: Secondary | ICD-10-CM | POA: Diagnosis present

## 2021-12-31 DIAGNOSIS — Z8616 Personal history of COVID-19: Secondary | ICD-10-CM | POA: Diagnosis not present

## 2021-12-31 DIAGNOSIS — G894 Chronic pain syndrome: Secondary | ICD-10-CM | POA: Diagnosis not present

## 2021-12-31 DIAGNOSIS — Z006 Encounter for examination for normal comparison and control in clinical research program: Secondary | ICD-10-CM

## 2021-12-31 DIAGNOSIS — Z885 Allergy status to narcotic agent status: Secondary | ICD-10-CM

## 2021-12-31 DIAGNOSIS — Z83438 Family history of other disorder of lipoprotein metabolism and other lipidemia: Secondary | ICD-10-CM

## 2021-12-31 DIAGNOSIS — Z951 Presence of aortocoronary bypass graft: Secondary | ICD-10-CM

## 2021-12-31 DIAGNOSIS — I10 Essential (primary) hypertension: Secondary | ICD-10-CM | POA: Diagnosis present

## 2021-12-31 DIAGNOSIS — T8092XA Unspecified transfusion reaction, initial encounter: Secondary | ICD-10-CM | POA: Diagnosis not present

## 2021-12-31 DIAGNOSIS — Z888 Allergy status to other drugs, medicaments and biological substances status: Secondary | ICD-10-CM

## 2021-12-31 DIAGNOSIS — Z6829 Body mass index (BMI) 29.0-29.9, adult: Secondary | ICD-10-CM | POA: Diagnosis not present

## 2021-12-31 DIAGNOSIS — I5022 Chronic systolic (congestive) heart failure: Secondary | ICD-10-CM | POA: Diagnosis not present

## 2021-12-31 DIAGNOSIS — E1142 Type 2 diabetes mellitus with diabetic polyneuropathy: Secondary | ICD-10-CM | POA: Diagnosis present

## 2021-12-31 DIAGNOSIS — Z7902 Long term (current) use of antithrombotics/antiplatelets: Secondary | ICD-10-CM

## 2021-12-31 DIAGNOSIS — E785 Hyperlipidemia, unspecified: Secondary | ICD-10-CM | POA: Diagnosis not present

## 2021-12-31 DIAGNOSIS — I2510 Atherosclerotic heart disease of native coronary artery without angina pectoris: Secondary | ICD-10-CM | POA: Diagnosis present

## 2021-12-31 DIAGNOSIS — I447 Left bundle-branch block, unspecified: Secondary | ICD-10-CM | POA: Diagnosis present

## 2021-12-31 DIAGNOSIS — Z7984 Long term (current) use of oral hypoglycemic drugs: Secondary | ICD-10-CM

## 2021-12-31 DIAGNOSIS — E43 Unspecified severe protein-calorie malnutrition: Secondary | ICD-10-CM | POA: Diagnosis not present

## 2021-12-31 DIAGNOSIS — I739 Peripheral vascular disease, unspecified: Secondary | ICD-10-CM | POA: Diagnosis present

## 2021-12-31 DIAGNOSIS — Z794 Long term (current) use of insulin: Secondary | ICD-10-CM

## 2021-12-31 DIAGNOSIS — I5020 Unspecified systolic (congestive) heart failure: Secondary | ICD-10-CM | POA: Diagnosis present

## 2021-12-31 DIAGNOSIS — D539 Nutritional anemia, unspecified: Secondary | ICD-10-CM | POA: Diagnosis not present

## 2021-12-31 DIAGNOSIS — N1832 Chronic kidney disease, stage 3b: Secondary | ICD-10-CM | POA: Diagnosis not present

## 2021-12-31 DIAGNOSIS — Z887 Allergy status to serum and vaccine status: Secondary | ICD-10-CM

## 2021-12-31 DIAGNOSIS — I25119 Atherosclerotic heart disease of native coronary artery with unspecified angina pectoris: Secondary | ICD-10-CM

## 2021-12-31 DIAGNOSIS — Z881 Allergy status to other antibiotic agents status: Secondary | ICD-10-CM

## 2021-12-31 DIAGNOSIS — E1151 Type 2 diabetes mellitus with diabetic peripheral angiopathy without gangrene: Secondary | ICD-10-CM | POA: Diagnosis not present

## 2021-12-31 DIAGNOSIS — F411 Generalized anxiety disorder: Secondary | ICD-10-CM | POA: Diagnosis present

## 2021-12-31 DIAGNOSIS — Z91048 Other nonmedicinal substance allergy status: Secondary | ICD-10-CM

## 2021-12-31 DIAGNOSIS — Z8249 Family history of ischemic heart disease and other diseases of the circulatory system: Secondary | ICD-10-CM

## 2021-12-31 DIAGNOSIS — Z833 Family history of diabetes mellitus: Secondary | ICD-10-CM

## 2021-12-31 DIAGNOSIS — M1990 Unspecified osteoarthritis, unspecified site: Secondary | ICD-10-CM | POA: Diagnosis present

## 2021-12-31 HISTORY — PX: INTRAOPERATIVE TRANSTHORACIC ECHOCARDIOGRAM: SHX6523

## 2021-12-31 HISTORY — PX: TRANSCATHETER AORTIC VALVE REPLACEMENT, TRANSFEMORAL: SHX6400

## 2021-12-31 HISTORY — DX: Presence of prosthetic heart valve: Z95.2

## 2021-12-31 LAB — POCT I-STAT, CHEM 8
BUN: 29 mg/dL — ABNORMAL HIGH (ref 8–23)
BUN: 28 mg/dL — ABNORMAL HIGH (ref 8–23)
BUN: 29 mg/dL — ABNORMAL HIGH (ref 8–23)
BUN: 27 mg/dL — ABNORMAL HIGH (ref 8–23)
Calcium, Ion: 1.06 mmol/L — ABNORMAL LOW (ref 1.15–1.40)
Calcium, Ion: 1.09 mmol/L — ABNORMAL LOW (ref 1.15–1.40)
Calcium, Ion: 1.15 mmol/L (ref 1.15–1.40)
Calcium, Ion: 1.11 mmol/L — ABNORMAL LOW (ref 1.15–1.40)
Chloride: 103 mmol/L (ref 98–111)
Chloride: 100 mmol/L (ref 98–111)
Chloride: 99 mmol/L (ref 98–111)
Chloride: 101 mmol/L (ref 98–111)
Creatinine, Ser: 1.7 mg/dL — ABNORMAL HIGH (ref 0.44–1.00)
Creatinine, Ser: 1.8 mg/dL — ABNORMAL HIGH (ref 0.44–1.00)
Creatinine, Ser: 1.8 mg/dL — ABNORMAL HIGH (ref 0.44–1.00)
Creatinine, Ser: 1.8 mg/dL — ABNORMAL HIGH (ref 0.44–1.00)
Glucose, Bld: 221 mg/dL — ABNORMAL HIGH (ref 70–99)
Glucose, Bld: 216 mg/dL — ABNORMAL HIGH (ref 70–99)
Glucose, Bld: 223 mg/dL — ABNORMAL HIGH (ref 70–99)
Glucose, Bld: 210 mg/dL — ABNORMAL HIGH (ref 70–99)
HCT: 21 % — ABNORMAL LOW (ref 36.0–46.0)
HCT: 22 % — ABNORMAL LOW (ref 36.0–46.0)
HCT: 23 % — ABNORMAL LOW (ref 36.0–46.0)
HCT: 23 % — ABNORMAL LOW (ref 36.0–46.0)
Hemoglobin: 7.1 g/dL — ABNORMAL LOW (ref 12.0–15.0)
Hemoglobin: 7.5 g/dL — ABNORMAL LOW (ref 12.0–15.0)
Hemoglobin: 7.8 g/dL — ABNORMAL LOW (ref 12.0–15.0)
Hemoglobin: 7.8 g/dL — ABNORMAL LOW (ref 12.0–15.0)
Potassium: 4.1 mmol/L (ref 3.5–5.1)
Potassium: 4.6 mmol/L (ref 3.5–5.1)
Potassium: 4.8 mmol/L (ref 3.5–5.1)
Potassium: 4.5 mmol/L (ref 3.5–5.1)
Sodium: 137 mmol/L (ref 135–145)
Sodium: 136 mmol/L (ref 135–145)
Sodium: 136 mmol/L (ref 135–145)
Sodium: 137 mmol/L (ref 135–145)
TCO2: 23 mmol/L (ref 22–32)
TCO2: 25 mmol/L (ref 22–32)
TCO2: 28 mmol/L (ref 22–32)
TCO2: 27 mmol/L (ref 22–32)

## 2021-12-31 LAB — POCT I-STAT 7, (LYTES, BLD GAS, ICA,H+H)
Acid-Base Excess: 1 mmol/L (ref 0.0–2.0)
Acid-base deficit: 1 mmol/L (ref 0.0–2.0)
Bicarbonate: 24.1 mmol/L (ref 20.0–28.0)
Bicarbonate: 27.4 mmol/L (ref 20.0–28.0)
Calcium, Ion: 1.09 mmol/L — ABNORMAL LOW (ref 1.15–1.40)
Calcium, Ion: 1.13 mmol/L — ABNORMAL LOW (ref 1.15–1.40)
HCT: 19 % — ABNORMAL LOW (ref 36.0–46.0)
HCT: 20 % — ABNORMAL LOW (ref 36.0–46.0)
Hemoglobin: 6.5 g/dL — CL (ref 12.0–15.0)
Hemoglobin: 6.8 g/dL — CL (ref 12.0–15.0)
O2 Saturation: 100 %
O2 Saturation: 100 %
Potassium: 4.2 mmol/L (ref 3.5–5.1)
Potassium: 4.5 mmol/L (ref 3.5–5.1)
Sodium: 136 mmol/L (ref 135–145)
Sodium: 137 mmol/L (ref 135–145)
TCO2: 25 mmol/L (ref 22–32)
TCO2: 29 mmol/L (ref 22–32)
pCO2 arterial: 43.7 mmHg (ref 32–48)
pCO2 arterial: 51.2 mmHg — ABNORMAL HIGH (ref 32–48)
pH, Arterial: 7.337 — ABNORMAL LOW (ref 7.35–7.45)
pH, Arterial: 7.35 (ref 7.35–7.45)
pO2, Arterial: 239 mmHg — ABNORMAL HIGH (ref 83–108)
pO2, Arterial: 274 mmHg — ABNORMAL HIGH (ref 83–108)

## 2021-12-31 LAB — GLUCOSE, CAPILLARY
Glucose-Capillary: 227 mg/dL — ABNORMAL HIGH (ref 70–99)
Glucose-Capillary: 178 mg/dL — ABNORMAL HIGH (ref 70–99)
Glucose-Capillary: 149 mg/dL — ABNORMAL HIGH (ref 70–99)
Glucose-Capillary: 139 mg/dL — ABNORMAL HIGH (ref 70–99)

## 2021-12-31 LAB — PREPARE RBC (CROSSMATCH)

## 2021-12-31 SURGERY — IMPLANTATION, AORTIC VALVE, TRANSCATHETER, FEMORAL APPROACH
Anesthesia: Monitor Anesthesia Care | Site: Chest | Laterality: Right

## 2021-12-31 MED ORDER — SODIUM CHLORIDE 0.9 % IV SOLN: INTRAVENOUS | Status: DC

## 2021-12-31 MED ORDER — IODIXANOL 320 MG/ML IV SOLN
INTRAVENOUS | Status: DC | PRN
  Administered 2021-12-31: 40 mL

## 2021-12-31 MED ORDER — EPHEDRINE SULFATE-NACL 50-0.9 MG/10ML-% IV SOSY
PREFILLED_SYRINGE | INTRAVENOUS | Status: DC | PRN
  Administered 2021-12-31: 5 mg via INTRAVENOUS

## 2021-12-31 MED ORDER — PREGABALIN 25 MG PO CAPS
50.0000 mg | ORAL_CAPSULE | Freq: Every day | ORAL | Status: DC | PRN
  Administered 2022-01-02: 50 mg via ORAL
  Filled 2021-12-31: qty 2

## 2021-12-31 MED ORDER — SODIUM CHLORIDE 0.9 % IV SOLN: 250.0000 mL | INTRAVENOUS | Status: DC | PRN

## 2021-12-31 MED ORDER — SERTRALINE HCL 100 MG PO TABS
100.0000 mg | ORAL_TABLET | Freq: Two times a day (BID) | ORAL | Status: DC
  Administered 2021-12-31 – 2022-01-02 (×4): 100 mg via ORAL
  Filled 2021-12-31 (×4): qty 1

## 2021-12-31 MED ORDER — FENTANYL CITRATE (PF) 100 MCG/2ML IJ SOLN
INTRAMUSCULAR | Status: DC | PRN
  Administered 2021-12-31: 50 ug via INTRAVENOUS

## 2021-12-31 MED ORDER — CHLORHEXIDINE GLUCONATE 0.12 % MT SOLN
15.0000 mL | Freq: Once | OROMUCOSAL | Status: AC
  Administered 2021-12-31: 15 mL via OROMUCOSAL
  Filled 2021-12-31: qty 15

## 2021-12-31 MED ORDER — ALBUMIN HUMAN 5 % IV SOLN
INTRAVENOUS | Status: AC
  Filled 2021-12-31: qty 250

## 2021-12-31 MED ORDER — LIDOCAINE HCL 1 % IJ SOLN
INTRAMUSCULAR | Status: DC | PRN
  Administered 2021-12-31: 5 mL

## 2021-12-31 MED ORDER — FENTANYL CITRATE (PF) 250 MCG/5ML IJ SOLN
INTRAMUSCULAR | Status: AC
  Filled 2021-12-31: qty 5

## 2021-12-31 MED ORDER — SODIUM CHLORIDE 0.9% FLUSH
3.0000 mL | Freq: Two times a day (BID) | INTRAVENOUS | Status: DC
  Administered 2021-12-31 – 2022-01-02 (×4): 3 mL via INTRAVENOUS

## 2021-12-31 MED ORDER — SODIUM CHLORIDE 0.9 % IV SOLN
10.0000 mL/h | Freq: Once | INTRAVENOUS | Status: AC
  Administered 2021-12-31: 10 mL/h via INTRAVENOUS

## 2021-12-31 MED ORDER — ACETAMINOPHEN-CODEINE #3 300-30 MG PO TABS
1.0000 | ORAL_TABLET | Freq: Four times a day (QID) | ORAL | Status: DC | PRN
  Administered 2021-12-31 – 2022-01-01 (×2): 1 via ORAL
  Filled 2021-12-31 (×2): qty 1

## 2021-12-31 MED ORDER — INSULIN ASPART 100 UNIT/ML IJ SOLN
INTRAMUSCULAR | Status: AC
  Administered 2021-12-31: 6 [IU] via SUBCUTANEOUS
  Filled 2021-12-31: qty 1

## 2021-12-31 MED ORDER — TORSEMIDE 20 MG PO TABS
20.0000 mg | ORAL_TABLET | Freq: Every day | ORAL | Status: DC
  Administered 2022-01-01 – 2022-01-02 (×2): 20 mg via ORAL
  Filled 2021-12-31 (×2): qty 1

## 2021-12-31 MED ORDER — CHLORHEXIDINE GLUCONATE CLOTH 2 % EX PADS
6.0000 | MEDICATED_PAD | Freq: Every day | CUTANEOUS | Status: DC
  Administered 2021-12-31 – 2022-01-02 (×3): 6 via TOPICAL

## 2021-12-31 MED ORDER — HEPARIN 6000 UNIT IRRIGATION SOLUTION
Status: AC
  Filled 2021-12-31: qty 1500

## 2021-12-31 MED ORDER — MIDAZOLAM HCL 5 MG/5ML IJ SOLN
INTRAMUSCULAR | Status: DC | PRN
  Administered 2021-12-31: 1 mg via INTRAVENOUS

## 2021-12-31 MED ORDER — PROPOFOL 500 MG/50ML IV EMUL
INTRAVENOUS | Status: DC | PRN
  Administered 2021-12-31: 10 ug/kg/min via INTRAVENOUS

## 2021-12-31 MED ORDER — INSULIN ASPART 100 UNIT/ML IJ SOLN
0.0000 [IU] | Freq: Three times a day (TID) | INTRAMUSCULAR | Status: DC
  Administered 2021-12-31 – 2022-01-01 (×2): 2 [IU] via SUBCUTANEOUS
  Administered 2022-01-01: 8 [IU] via SUBCUTANEOUS
  Administered 2022-01-01: 4 [IU] via SUBCUTANEOUS
  Administered 2022-01-01: 8 [IU] via SUBCUTANEOUS
  Administered 2022-01-02: 4 [IU] via SUBCUTANEOUS

## 2021-12-31 MED ORDER — MIDAZOLAM HCL 2 MG/2ML IJ SOLN
INTRAMUSCULAR | Status: AC
  Filled 2021-12-31: qty 2

## 2021-12-31 MED ORDER — ASPIRIN 81 MG PO TBEC
81.0000 mg | DELAYED_RELEASE_TABLET | Freq: Every day | ORAL | Status: DC
  Administered 2021-12-31 – 2022-01-02 (×3): 81 mg via ORAL
  Filled 2021-12-31 (×5): qty 1

## 2021-12-31 MED ORDER — CHLORHEXIDINE GLUCONATE 0.12 % MT SOLN: 15.0000 mL | Freq: Once | OROMUCOSAL | Status: DC

## 2021-12-31 MED ORDER — HEPARIN SODIUM (PORCINE) 1000 UNIT/ML IJ SOLN
INTRAMUSCULAR | Status: DC | PRN
  Administered 2021-12-31: 12000 [IU] via INTRAVENOUS

## 2021-12-31 MED ORDER — GLYCOPYRROLATE 0.2 MG/ML IJ SOLN
INTRAMUSCULAR | Status: DC | PRN
  Administered 2021-12-31: .2 mg via INTRAVENOUS

## 2021-12-31 MED ORDER — LIDOCAINE HCL 1 % IJ SOLN
INTRAMUSCULAR | Status: AC
  Filled 2021-12-31: qty 20

## 2021-12-31 MED ORDER — PROTAMINE SULFATE 10 MG/ML IV SOLN
INTRAVENOUS | Status: AC
  Filled 2021-12-31: qty 25

## 2021-12-31 MED ORDER — CHLORHEXIDINE GLUCONATE 4 % EX LIQD: Freq: Once | CUTANEOUS | Status: DC

## 2021-12-31 MED ORDER — ROSUVASTATIN CALCIUM 20 MG PO TABS
40.0000 mg | ORAL_TABLET | Freq: Every day | ORAL | Status: DC
  Administered 2022-01-01 – 2022-01-02 (×2): 40 mg via ORAL
  Filled 2021-12-31 (×2): qty 2

## 2021-12-31 MED ORDER — PROPOFOL 10 MG/ML IV BOLUS
INTRAVENOUS | Status: DC | PRN
  Administered 2021-12-31: 10 mg via INTRAVENOUS

## 2021-12-31 MED ORDER — CHLORHEXIDINE GLUCONATE 4 % EX LIQD: 30.0000 mL | CUTANEOUS | Status: DC

## 2021-12-31 MED ORDER — PROPOFOL 10 MG/ML IV BOLUS
INTRAVENOUS | Status: AC
  Filled 2021-12-31: qty 20

## 2021-12-31 MED ORDER — ALPRAZOLAM 0.5 MG PO TABS
0.5000 mg | ORAL_TABLET | Freq: Two times a day (BID) | ORAL | Status: DC | PRN
  Administered 2021-12-31: 0.5 mg via ORAL
  Filled 2021-12-31: qty 2

## 2021-12-31 MED ORDER — ALBUMIN HUMAN 5 % IV SOLN
12.5000 g | Freq: Once | INTRAVENOUS | Status: AC
  Administered 2021-12-31: 12.5 g via INTRAVENOUS

## 2021-12-31 MED ORDER — SODIUM CHLORIDE 0.9% IV SOLUTION: Freq: Once | INTRAVENOUS | Status: DC

## 2021-12-31 MED ORDER — INSULIN ASPART 100 UNIT/ML IJ SOLN: 0.0000 [IU] | INTRAMUSCULAR | Status: DC | PRN

## 2021-12-31 MED ORDER — CLOPIDOGREL BISULFATE 75 MG PO TABS
75.0000 mg | ORAL_TABLET | Freq: Every day | ORAL | Status: DC
  Administered 2021-12-31 – 2022-01-02 (×3): 75 mg via ORAL
  Filled 2021-12-31 (×3): qty 1

## 2021-12-31 MED ORDER — VANCOMYCIN HCL IN DEXTROSE 1-5 GM/200ML-% IV SOLN
1000.0000 mg | Freq: Once | INTRAVENOUS | Status: AC
  Administered 2021-12-31: 1000 mg via INTRAVENOUS
  Filled 2021-12-31: qty 200

## 2021-12-31 MED ORDER — EZETIMIBE 10 MG PO TABS
10.0000 mg | ORAL_TABLET | Freq: Every day | ORAL | Status: DC
  Administered 2022-01-01 – 2022-01-02 (×2): 10 mg via ORAL
  Filled 2021-12-31 (×2): qty 1

## 2021-12-31 MED ORDER — ORAL CARE MOUTH RINSE: 15.0000 mL | Freq: Once | OROMUCOSAL | Status: AC

## 2021-12-31 MED ORDER — HEPARIN SODIUM (PORCINE) 1000 UNIT/ML IJ SOLN
INTRAMUSCULAR | Status: AC
  Filled 2021-12-31: qty 20

## 2021-12-31 MED ORDER — ONDANSETRON HCL 4 MG/2ML IJ SOLN: 4.0000 mg | Freq: Four times a day (QID) | INTRAMUSCULAR | Status: DC | PRN

## 2021-12-31 MED ORDER — EMPAGLIFLOZIN 25 MG PO TABS
25.0000 mg | ORAL_TABLET | Freq: Every day | ORAL | Status: DC
  Administered 2022-01-01 – 2022-01-02 (×2): 25 mg via ORAL
  Filled 2021-12-31 (×2): qty 1

## 2021-12-31 MED ORDER — HEPARIN 6000 UNIT IRRIGATION SOLUTION
Status: DC | PRN
  Administered 2021-12-31 (×3): 1

## 2021-12-31 MED ORDER — LEVETIRACETAM ER 500 MG PO TB24
1500.0000 mg | ORAL_TABLET | Freq: Every day | ORAL | Status: DC
  Administered 2021-12-31 – 2022-01-01 (×2): 1500 mg via ORAL
  Filled 2021-12-31 (×4): qty 3

## 2021-12-31 MED ORDER — LACTATED RINGERS IV SOLN: INTRAVENOUS | Status: DC

## 2021-12-31 MED ORDER — SODIUM CHLORIDE 0.9% FLUSH: 3.0000 mL | INTRAVENOUS | Status: DC | PRN

## 2021-12-31 MED ORDER — SODIUM CHLORIDE 0.9 % IV SOLN
INTRAVENOUS | Status: AC
  Administered 2021-12-31 (×2): 50 mL/h via INTRAVENOUS

## 2021-12-31 MED ORDER — LACTATED RINGERS IV SOLN: INTRAVENOUS | Status: DC | PRN

## 2021-12-31 MED ORDER — PROTAMINE SULFATE 10 MG/ML IV SOLN
INTRAVENOUS | Status: DC | PRN
  Administered 2021-12-31: 50 mg via INTRAVENOUS
  Administered 2021-12-31 (×2): 10 mg via INTRAVENOUS
  Administered 2021-12-31: 50 mg via INTRAVENOUS

## 2021-12-31 SURGICAL SUPPLY — 63 items
BAG COUNTER SPONGE SURGICOUNT (BAG) ×2 IMPLANT
BAG DECANTER FOR FLEXI CONT (MISCELLANEOUS) IMPLANT
BLADE CLIPPER SURG (BLADE) IMPLANT
BLADE STERNUM SYSTEM 6 (BLADE) IMPLANT
CABLE ADAPT CONN TEMP 6FT (ADAPTER) ×2 IMPLANT
CANISTER SUCT 3000ML PPV (MISCELLANEOUS) IMPLANT
CATH DIAG EXPO 6F AL1 (CATHETERS) IMPLANT
CATH DIAG EXPO 6F VENT PIG 145 (CATHETERS) ×4 IMPLANT
CATH INFINITI 6F AL2 (CATHETERS) IMPLANT
CATH S G BIP PACING (CATHETERS) ×2 IMPLANT
CHLORAPREP W/TINT 26 (MISCELLANEOUS) ×2 IMPLANT
CLOSURE PERCLOSE PROSTYLE (VASCULAR PRODUCTS) IMPLANT
CNTNR URN SCR LID CUP LEK RST (MISCELLANEOUS) ×4 IMPLANT
CONT SPEC 4OZ STRL OR WHT (MISCELLANEOUS) ×4
COVER BACK TABLE 80X110 HD (DRAPES) IMPLANT
DERMABOND ADVANCED .7 DNX12 (GAUZE/BANDAGES/DRESSINGS) ×2 IMPLANT
DEVICE CLOSURE PERCLS PRGLD 6F (VASCULAR PRODUCTS) ×4 IMPLANT
DRSG IV TEGADERM 3.5X4.5 STRL (GAUZE/BANDAGES/DRESSINGS) IMPLANT
DRSG TEGADERM 4X4.75 (GAUZE/BANDAGES/DRESSINGS) ×4 IMPLANT
ELECT REM PT RETURN 9FT ADLT (ELECTROSURGICAL) ×2
ELECTRODE REM PT RTRN 9FT ADLT (ELECTROSURGICAL) ×2 IMPLANT
GAUZE SPONGE 4X4 12PLY STRL (GAUZE/BANDAGES/DRESSINGS) ×2 IMPLANT
GLOVE BIO SURGEON STRL SZ7.5 (GLOVE) ×2 IMPLANT
GLOVE BIO SURGEON STRL SZ8 (GLOVE) ×2 IMPLANT
GLOVE ECLIPSE 7.0 STRL STRAW (GLOVE) ×2 IMPLANT
GOWN STRL REUS W/ TWL LRG LVL3 (GOWN DISPOSABLE) IMPLANT
GOWN STRL REUS W/ TWL XL LVL3 (GOWN DISPOSABLE) ×2 IMPLANT
GOWN STRL REUS W/TWL LRG LVL3 (GOWN DISPOSABLE)
GOWN STRL REUS W/TWL XL LVL3 (GOWN DISPOSABLE) ×2
GUIDEWIRE SAF TJ AMPL .035X180 (WIRE) ×2 IMPLANT
GUIDEWIRE SAFE TJ AMPLATZ EXST (WIRE) ×2 IMPLANT
KIT BASIN OR (CUSTOM PROCEDURE TRAY) ×2 IMPLANT
KIT HEART LEFT (KITS) ×2 IMPLANT
KIT SAPIAN 3 ULTRA RESILIA 23 (Valve) IMPLANT
KIT TURNOVER KIT B (KITS) ×2 IMPLANT
NS IRRIG 1000ML POUR BTL (IV SOLUTION) ×2 IMPLANT
PACK ENDO MINOR (CUSTOM PROCEDURE TRAY) ×2 IMPLANT
PAD ARMBOARD 7.5X6 YLW CONV (MISCELLANEOUS) ×4 IMPLANT
PAD ELECT DEFIB RADIOL ZOLL (MISCELLANEOUS) ×2 IMPLANT
PERCLOSE PROGLIDE 6F (VASCULAR PRODUCTS) ×4
POSITIONER HEAD DONUT 9IN (MISCELLANEOUS) ×2 IMPLANT
SET MICROPUNCTURE 5F STIFF (MISCELLANEOUS) ×2 IMPLANT
SHEATH BRITE TIP 6FR 35CM (SHEATH) IMPLANT
SHEATH BRITE TIP 7FR 35CM (SHEATH) ×2 IMPLANT
SHEATH PINNACLE 6F 10CM (SHEATH) ×2 IMPLANT
SHEATH PINNACLE 8F 10CM (SHEATH) ×2 IMPLANT
SLEEVE REPOSITIONING LENGTH 30 (MISCELLANEOUS) ×2 IMPLANT
SPIKE FLUID TRANSFER (MISCELLANEOUS) ×2 IMPLANT
SPONGE T-LAP 18X18 ~~LOC~~+RFID (SPONGE) ×2 IMPLANT
STOPCOCK MORSE 400PSI 3WAY (MISCELLANEOUS) ×4 IMPLANT
SUT SILK  1 MH (SUTURE) ×2
SUT SILK 1 MH (SUTURE) ×2 IMPLANT
SYR 50ML LL SCALE MARK (SYRINGE) ×2 IMPLANT
SYR BULB IRRIG 60ML STRL (SYRINGE) IMPLANT
TOWEL GREEN STERILE (TOWEL DISPOSABLE) ×4 IMPLANT
TRANSDUCER W/STOPCOCK (MISCELLANEOUS) ×4 IMPLANT
TRAY FOLEY SLVR 14FR TEMP STAT (SET/KITS/TRAYS/PACK) IMPLANT
TUBE SUCT INTRACARD DLP 20F (MISCELLANEOUS) IMPLANT
TUBING CIL FLEX 10 FLL-RA (TUBING) IMPLANT
WIRE EMERALD 3MM-J .035X150CM (WIRE) ×2 IMPLANT
WIRE EMERALD 3MM-J .035X260CM (WIRE) ×2 IMPLANT
WIRE EMERALD ST .035X260CM (WIRE) ×4 IMPLANT
WIRE SAFARI SM CURVE 275 (WIRE) ×2 IMPLANT

## 2021-12-31 NOTE — Discharge Instructions (Signed)

## 2021-12-31 NOTE — Anesthesia Procedure Notes (Signed)
Arterial Line Insertion Start/End10/17/2023 7:18 AM, 12/31/2021 7:23 AM Performed by: CRNA  Patient location: Pre-op. Preanesthetic checklist: patient identified, IV checked, site marked, risks and benefits discussed, surgical consent, monitors and equipment checked, pre-op evaluation, timeout performed and anesthesia consent Lidocaine 1% used for infiltration Right, radial was placed Catheter size: 20 G Hand hygiene performed  and maximum sterile barriers used   Attempts: 1 Procedure performed without using ultrasound guided technique. Following insertion, dressing applied and Biopatch. Post procedure assessment: normal  Patient tolerated the procedure well with no immediate complications.

## 2021-12-31 NOTE — Progress Notes (Signed)
Katie PA and Dr Burt Knack in to check on patient.  Order given for Albumin

## 2021-12-31 NOTE — Discharge Summary (Incomplete)
[place] VALVE TEAM  Discharge Summary    Patient ID: [name] MRN: [id number]; DOB: [birth year]  Admit date: 12/31/2021 Discharge date: 01/02/2022  Primary Care Provider: [name], MD  Primary Cardiologist: Minus Breeding, MD / Dr. Burt Knack & Dr. Cyndia Bent (TAVR)  Discharge Diagnoses    Principal Problem:   S/P TAVR (transcatheter aortic valve replacement) Active Problems:   Uncontrolled type 2 diabetes mellitus with hyperglycemia, with long-term current use of insulin ([place])   Essential hypertension   CAD (coronary artery disease) of artery bypass graft   History of carotid endarterectomy, right   PVD (peripheral vascular disease) (HCC)   Protein-calorie malnutrition, severe (HCC)   Chronic pain syndrome   Gastroesophageal reflux disease   S/P CABG x 4   S/P below-knee amputation of both lower extremities   Nonrheumatic aortic valve stenosis   Chronic kidney disease, stage 3b (HCC)   Acute kidney injury superimposed on chronic kidney disease IV (HCC)   HFrEF (heart failure with reduced ejection fraction) (HCC)   Allergies Allergies  Allergen Reactions   Metformin Hcl Nausea And Vomiting   Ace Inhibitors Cough   Actos [Pioglitazone] Swelling   Atorvastatin Other (See Comments)    myalgias   Candesartan Cilexetil Other (See Comments)    Rash "cant recall"    Gabapentin Other (See Comments)    Dizziness and unbalanced   Influenza Vaccines     nearly killed me   Isosorbide Nitrate Other (See Comments)    HA.   Keflex [Cephalexin]      stomach upset   Quinapril Hcl Cough   Tramadol Hcl Other (See Comments)    seziure   Tape Rash    Redness, rash, itchiness     Diagnostic Studies/Procedures    TAVR OPERATIVE NOTE     Date of Procedure:                12/31/2021   Preoperative Diagnosis:      Severe Aortic Stenosis    Postoperative Diagnosis:    Same    Procedure:        Transcatheter Aortic  Valve Replacement - Percutaneous  Transfemoral Approach             Edwards Sapien 3 Ultra Resilia THV (size 23 mm, serial # [id number])              Co-Surgeons:                        Gaye Pollack, MD and Sherren Mocha, MD   Anesthesiologist:                  Adele Barthel, MD   Echocardiographer:              Jenkins Rouge, MD   Pre-operative Echo Findings: Severe aortic stenosis Mild left ventricular systolic dysfunction LVEF 45%   Post-operative Echo Findings: No paravalvular leak Unchanged left ventricular systolic function   _____________   Echo 01/01/22:  IMPRESSIONS   1. Left ventricular ejection fraction, by estimation, is 35 to 40%. The  left ventricle has moderately decreased function. The left ventricle  demonstrates regional wall motion abnormalities with severe hypokinesis of  the septum. Left ventricular  diastolic parameters are consistent with Grade II diastolic dysfunction  (pseudonormalization).   2. Right ventricular systolic function was not well visualized. The right  ventricular size is not well visualized. There is moderately  elevated  pulmonary artery systolic pressure. The estimated right ventricular  systolic pressure is 03.4 mmHg.   3. Left atrial size was mildly dilated.   4. The mitral valve is degenerative. Mild mitral valve regurgitation.  Moderate mitral stenosis. The mean mitral valve gradient is 9.0 mmHg, MVA  1.48 cm^2 by VTI. Moderate mitral annular calcification.   5. Bioprosthetic aortic valve s/p TAVR witih 23 mm Edwards Sapien THV.  Mean gradient 7 mmHg, EOA 1.86 cm^2, DI 0.66. No significant perivalvular  leakage noted.   6. The inferior vena cava is dilated in size with <50% respiratory  variability, suggesting right atrial pressure of 15 mmHg.   7. Technically difficult study with poor acoustic windows. All wall  segments of the LV were not visualized and the RV was poorly visualized.   History of Present Illness     [name] is a 66 y.o. female with a history of CAD s/p CABG 2009, obesity, HTN, HLD, PAD s/p bilateral BKAs, DMT2, peripheral neuropathy, CKD stage IIIb, anemia, chronic systolic CHF (EF 74-25%), mitral valve disease and severe AS who presented to [facility] on 12/30/21 for planned TAVR.   She was hospitalized with respiratory failure in 02/2021 and found to be COVID-positive with elevated troponin consistent with demand ischemia.  She was then hospitalized again in February 2023 with syncope and hypotension.  She ultimately underwent a TEE 07/31/21 showing partial fusion of the right and left coronary cusps with a mean gradient 28 mmHg and dimensionless index of 0.23.  Aortic valve area was calculated at 0.73 cm.  It was felt that she most likely had severe low-flow/low gradient aortic stenosis. Cardiac cath 10/07/21 showed multivessel CAD with total occlusion of the LAD, left circumflex, and right coronary arteries.  She had continued patency of the LIMA to LAD and free left radial graft to obtuse marginal.  Her vein graft to the right coronary artery and diagonal were occluded and there was extensive collateralization of the RCA branches from left to right collaterals.  She had a mean transaortic gradient of 25 mmHg, peak to peak gradient 31 mmHg, and calculated aortic valve area of 1.4 cm.    The patient has been evaluated by the multidisciplinary valve team and felt to have severe, symptomatic aortic stenosis and to be a suitable candidate for TAVR, which was set up for 12/31/21.  Hospital Course     Consultants: none   Severe AS: s/p successful TAVR with a 23 mm Edwards Sapien 3 Ultra Resilia THV via the TF approach on 12/31/21. Post operative echo showed EF 35-40%, normally functioning TAVR with a mean gradient of 7 mmHg and no PVL as well as moderate MAC with mild MR and moderate MS. Groin sites are stable. ECG with sinus and old LBBB but no high grade heart block. Continued on her chronic Asprin and  Plavix. Plan for discharge home today.    HTN: BP soft post operatively requiring a levophed gtt. Now normalized and back on home meds.    Sinus tachycardia: resolved after resuming home Coreg 6.73m BID.  Fever: low grade associated some sweats and chills. White count is mildlly elevated ~12.6--> 10.7. Will continue to monitor.    Acute on chronic anemia: hg dropped to 7.1 pre op. She was treated with 2U PRBCs. Hg 8.5 today.    CKD stage IIIb: creat close to baseline ~1.8-1.9.    Chronic combined S/D CHF: appears euvolemic.    PAD s/p bilateral BKA: continue medical  therapy.    Protein calorie malnutrition: albumin 3.2     DMT2: continue SSI   CAD s/p CABG: pre TAVR cath showed showed multivessel CAD with total occlusion of the LAD, left circumflex, and right coronary arteries.  She had continued patency of the LIMA to LAD and free left radial graft to obtuse marginal.  Her vein graft to the right coronary artery and diagonal were occluded and there was extensive collateralization of the RCA branches from left to right collaterals. Continue medical therapy.    Discharge Vitals Blood pressure 112/60, pulse 92, temperature 98.4 F (36.9 C), temperature source Oral, resp. rate 18, height _0  (1.651 m), weight 81.7 kg, SpO2 96 %.  Filed Weights   12/31/21 0627 01/01/22 0500 01/02/22 0656  Weight: 76.7 kg 76 kg 81.7 kg   GEN: Well nourished, well developed, in no acute distress.  HEENT: Grossly normal.  Cardiac: RR, mild tachy. no murmurs, rubs, or gallops. No clubbing, cyanosis, edema.   Respiratory:  Respirations regular and unlabored, clear to auscultation bilaterally. GI: Soft, nontender, nondistended, BS + x 4. MS: no deformity or atrophy. S/p bilateral BKA.  Skin: warm and dry, no rash.  Groin sites clear without hematoma or ecchymosis  Neuro:  Strength and sensation are intact. Psych: AAOx3.  Normal affect.  Labs & Radiologic Studies    CBC Recent Labs     01/01/22 0445 01/02/22 0107  WBC 12.6* 10.7*  HGB 7.9* 8.5*  HCT 25.4* 27.4*  MCV 81.2 81.1  PLT 190 789   Basic Metabolic Panel Recent Labs    01/01/22 0445 01/02/22 0107  NA 134* 136  K 4.8 4.7  CL 104 102  CO2 22 25  GLUCOSE 145* 217*  BUN 24* 25*  CREATININE 1.66* 1.83*  CALCIUM 8.1* 8.7*  MG 2.1  --    Liver Function Tests No results for input(s): "AST", "ALT", "ALKPHOS", "BILITOT", "PROT", "ALBUMIN" in the last 72 hours. No results for input(s): "LIPASE", "AMYLASE" in the last 72 hours. Cardiac Enzymes No results for input(s): "CKTOTAL", "CKMB", "CKMBINDEX", "TROPONINI" in the last 72 hours. BNP Invalid input(s): "POCBNP" D-Dimer No results for input(s): "DDIMER" in the last 72 hours. Hemoglobin A1C No results for input(s): "HGBA1C" in the last 72 hours. Fasting Lipid Panel No results for input(s): "CHOL", "HDL", "LDLCALC", "TRIG", "CHOLHDL", "LDLDIRECT" in the last 72 hours. Thyroid Function Tests No results for input(s): "TSH", "T4TOTAL", "T3FREE", "THYROIDAB" in the last 72 hours.  Invalid input(s): "FREET3" _____________  ECHOCARDIOGRAM COMPLETE  Result Date: 01/01/2022    ECHOCARDIOGRAM REPORT   Patient Name:   [name] Date of Exam: 01/01/2022 Medical Rec #:  [id number]        Height:       65.0 in Accession #:    [id number]       Weight:       167.5 lb Date of Birth:  1955-09-03         BSA:          1.835 m Patient Age:    61 years         BP:           119/70 mmHg Patient Gender: F                HR:           94 bpm. Exam Location:  Inpatient Procedure: 2D Echo, Color Doppler and Cardiac Doppler Indications:    Post TAVR Evaluation z95.2  History:  Patient has prior history of Echocardiogram examinations, most                 recent 12/31/2021.                 Aortic Valve: 23 mm Edwards Sapien prosthetic, stented (TAVR)                 valve is present in the aortic position. Procedure Date:                 12/31/21.  Sonographer:    Raquel Sarna  Senior RDCS Referring Phys: [id number] Eileen Stanford  Sonographer Comments: Technically difficult due to very poor echo windows. Unable to use definity contrast due to lack of IV access. IMPRESSIONS  1. Left ventricular ejection fraction, by estimation, is 35 to 40%. The left ventricle has moderately decreased function. The left ventricle demonstrates regional wall motion abnormalities with severe hypokinesis of the septum. Left ventricular diastolic parameters are consistent with Grade II diastolic dysfunction (pseudonormalization).  2. Right ventricular systolic function was not well visualized. The right ventricular size is not well visualized. There is moderately elevated pulmonary artery systolic pressure. The estimated right ventricular systolic pressure is 38.9 mmHg.  3. Left atrial size was mildly dilated.  4. The mitral valve is degenerative. Mild mitral valve regurgitation. Moderate mitral stenosis. The mean mitral valve gradient is 9.0 mmHg, MVA 1.48 cm^2 by VTI. Moderate mitral annular calcification.  5. Bioprosthetic aortic valve s/p TAVR witih 23 mm Edwards Sapien THV. Mean gradient 7 mmHg, EOA 1.86 cm^2, DI 0.66. No significant perivalvular leakage noted.  6. The inferior vena cava is dilated in size with <50% respiratory variability, suggesting right atrial pressure of 15 mmHg.  7. Technically difficult study with poor acoustic windows. All wall segments of the LV were not visualized and the RV was poorly visualized. FINDINGS  Left Ventricle: Left ventricular ejection fraction, by estimation, is 35 to 40%. The left ventricle has moderately decreased function. The left ventricle demonstrates regional wall motion abnormalities. The left ventricular internal cavity size was normal in size. There is no left ventricular hypertrophy. Left ventricular diastolic parameters are consistent with Grade II diastolic dysfunction (pseudonormalization). Right Ventricle: The right ventricular size is not well  visualized. Right vetricular wall thickness was not well visualized. Right ventricular systolic function was not well visualized. There is moderately elevated pulmonary artery systolic pressure. The  tricuspid regurgitant velocity is 2.79 m/s, and with an assumed right atrial pressure of 15 mmHg, the estimated right ventricular systolic pressure is 37.3 mmHg. Left Atrium: Left atrial size was mildly dilated. Right Atrium: Right atrial size was normal in size. Pericardium: There is no evidence of pericardial effusion. Mitral Valve: The mitral valve is degenerative in appearance. There is moderate calcification of the mitral valve leaflet(s). Moderate mitral annular calcification. Mild mitral valve regurgitation. Moderate mitral valve stenosis. MV peak gradient, 15.5 mmHg. The mean mitral valve gradient is 9.0 mmHg. Tricuspid Valve: The tricuspid valve is normal in structure. Tricuspid valve regurgitation is trivial. Aortic Valve: Bioprosthetic aortic valve s/p TAVR witih 23 mm Edwards Sapien THV. Mean gradient 7 mmHg, EOA 1.86 cm^2, DI 0.66. No significant perivalvular leakage noted. The aortic valve has been repaired/replaced. Aortic valve regurgitation is not visualized. Aortic valve mean gradient measures 7.0 mmHg. Aortic valve peak gradient measures 16.0 mmHg. Aortic valve area, by VTI measures 1.86 cm. There is a 23 mm Edwards Sapien prosthetic, stented (TAVR) valve present in the aortic position.  Procedure Date: 12/31/21. Pulmonic Valve: The pulmonic valve was normal in structure. Pulmonic valve regurgitation is not visualized. Aorta: The aortic root is normal in size and structure. Venous: The inferior vena cava is dilated in size with less than 50% respiratory variability, suggesting right atrial pressure of 15 mmHg. IAS/Shunts: No atrial level shunt detected by color flow Doppler.  LEFT VENTRICLE PLAX 2D LVIDd:         4.00 cm   Diastology LVIDs:         3.10 cm   LV e' medial:    3.05 cm/s LV PW:          0.90 cm   LV E/e' medial:  60.3 LV IVS:        0.90 cm   LV e' lateral:   5.44 cm/s LVOT diam:     1.90 cm   LV E/e' lateral: 33.8 LV SV:         60 LV SV Index:   33 LVOT Area:     2.84 cm  RIGHT VENTRICLE RV S prime:     6.05 cm/s LEFT ATRIUM             Index        RIGHT ATRIUM           Index LA diam:        4.30 cm 2.34 cm/m   RA Area:     17.50 cm LA Vol (A2C):   62.0 ml 33.79 ml/m  RA Volume:   44.70 ml  24.36 ml/m LA Vol (A4C):   64.7 ml 35.26 ml/m LA Biplane Vol: 64.7 ml 35.26 ml/m  AORTIC VALVE AV Area (Vmax):    1.87 cm AV Area (Vmean):   2.10 cm AV Area (VTI):     1.86 cm AV Vmax:           200.00 cm/s AV Vmean:          121.000 cm/s AV VTI:            0.322 m AV Peak Grad:      16.0 mmHg AV Mean Grad:      7.0 mmHg LVOT Vmax:         132.00 cm/s LVOT Vmean:        89.600 cm/s LVOT VTI:          0.211 m LVOT/AV VTI ratio: 0.66 MITRAL VALVE                TRICUSPID VALVE MV Area (PHT): 3.24 cm     TR Peak grad:   31.1 mmHg MV Area VTI:   1.48 cm     TR Vmax:        279.00 cm/s MV Peak grad:  15.5 mmHg MV Mean grad:  9.0 mmHg     SHUNTS MV Vmax:       1.97 m/s     Systemic VTI:  0.21 m MV Vmean:      140.0 cm/s   Systemic Diam: 1.90 cm MV Decel Time: 234 msec MV E velocity: 184.00 cm/s MV A velocity: 176.00 cm/s MV E/A ratio:  1.05 Dalton McleanMD Electronically signed by Franki Monte Signature Date/Time: 01/01/2022/3:13:54 PM    Final    ECHOCARDIOGRAM LIMITED  Result Date: 12/31/2021    ECHOCARDIOGRAM LIMITED REPORT   Patient Name:   DREYA BUHRMAN Date of Exam: 12/31/2021 Medical Rec #:  [id number]        Height:  65.0 in Accession #:    [id number]       Weight:       169.0 lb Date of Birth:  [birth year]         BSA:          1.841 m Patient Age:    64 years         BP:           123/59 mmHg Patient Gender: F                HR:           62 bpm. Exam Location:  Inpatient Procedure: Limited Echo, Color Doppler and Cardiac Doppler Indications:     Aortic Stenosis i35.0  History:          Patient has prior history of Echocardiogram examinations, most                  recent 07/31/2021. Prior CABG; Risk Factors:Hypertension,                  Diabetes and Dyslipidemia.  Sonographer:     Raquel Sarna Senior RDCS Referring Phys:  [id number] Woodfin Ganja THOMPSON Diagnosing Phys: Jenkins Rouge MD  Sonographer Comments: 25m Edwards S3U TAVR Implanted IMPRESSIONS  1. Septal and apical hypokinesis . Left ventricular ejection fraction, by estimation, is 40 to 45%. The left ventricle has mildly decreased function. The left ventricle demonstrates regional wall motion abnormalities (see scoring diagram/findings for description). The left ventricular internal cavity size was mildly dilated.  2. Not fully evaluated with doppler . The mitral valve is abnormal. Mild mitral valve regurgitation. Severe mitral annular calcification.  3. Pre TAVR : sever low flor AS mean gradient 20 peak 33 mmHg AVA 0.78 cm2     Post TAVR: well placed 23 mm Sapien 3 valve no PVL mean gradient 4 peak 7 mmHg AVA 1.74 cm2. The aortic valve has been repaired/replaced. FINDINGS  Left Ventricle: Septal and apical hypokinesis. Left ventricular ejection fraction, by estimation, is 40 to 45%. The left ventricle has mildly decreased function. The left ventricle demonstrates regional wall motion abnormalities. The left ventricular internal cavity size was mildly dilated. Mitral Valve: Not fully evaluated with doppler. The mitral valve is abnormal. There is moderate thickening of the mitral valve leaflet(s). There is moderate calcification of the mitral valve leaflet(s). Severe mitral annular calcification. Mild mitral valve regurgitation. Tricuspid Valve: Tricuspid valve regurgitation is mild. Aortic Valve: Pre TAVR : sever low flor AS mean gradient 20 peak 33 mmHg AVA 0.78 cm2 Post TAVR: well placed 23 mm Sapien 3 valve no PVL mean gradient 4 peak 7 mmHg AVA 1.74 cm2. The aortic valve has been repaired/replaced. PJenkins RougeMD Electronically signed by  PJenkins RougeMD Signature Date/Time: 12/31/2021/10:00:28 AM    Final    Structural Heart Procedure  Result Date: 12/31/2021 See surgical note for result.  DG Chest 2 View  Result Date: 12/30/2021 CLINICAL DATA:  Preop exam.  Aortic valve stenosis. EXAM: CHEST - 2 VIEW COMPARISON:  Chest x-ray dated 04/21/2021 FINDINGS: Stable cardiomegaly. Median sternotomy wires appear intact and stable in alignment. Lungs are clear. No pleural effusion is seen. No acute-appearing osseous abnormality. Aortic atherosclerosis. IMPRESSION: No active cardiopulmonary disease. No evidence of pneumonia or pulmonary edema. Stable cardiomegaly. Electronically Signed   By: SFranki CabotM.D.   On: 12/30/2021 08:54   Disposition   Pt is being discharged home today in good condition.  Follow-up Plans & Appointments  Follow-up Information     Eileen Stanford, PA-C. Go on 01/08/2022.   Specialties: Cardiology, Radiology Why: @ 1:30 pm, please arrive at least 10 min early. Contact information: [address] [place] [id number] [phone]                  Discharge Medications   Allergies as of 01/02/2022       Reactions   Metformin Hcl Nausea And Vomiting   Ace Inhibitors Cough   Actos [pioglitazone] Swelling   Atorvastatin Other (See Comments)   myalgias   Candesartan Cilexetil Other (See Comments)   Rash "cant recall"    Gabapentin Other (See Comments)   Dizziness and unbalanced   Influenza Vaccines    nearly killed me   Isosorbide Nitrate Other (See Comments)   HA.   Keflex [cephalexin]     stomach upset   Quinapril Hcl Cough   Tramadol Hcl Other (See Comments)   seziure   Tape Rash   Redness, rash, itchiness        Medication List     TAKE these medications    acetaminophen-codeine 300-30 MG tablet Commonly known as: TYLENOL #3 Take 1 tablet by mouth every 6 (six) hours as needed for severe pain.   albuterol 108 (90 Base) MCG/ACT  inhaler Commonly known as: VENTOLIN HFA Inhale 1-2 puffs into the lungs every 6 (six) hours as needed for wheezing or shortness of breath.   ALPRAZolam 0.5 MG tablet Commonly known as: XANAX Take 0.5 mg by mouth 2 (two) times daily as needed for anxiety.   aspirin 81 MG tablet Take 1 tablet (81 mg total) by mouth daily.   carvedilol 6.25 MG tablet Commonly known as: COREG Take 1 tablet (6.25 mg total) by mouth 2 (two) times daily.   clopidogrel 75 MG tablet Commonly known as: PLAVIX Take 1 tablet (75 mg total) by mouth daily.   cycloSPORINE 0.05 % ophthalmic emulsion Commonly known as: RESTASIS Place 1 drop into both eyes 2 (two) times daily as needed (dry eyes).   diclofenac Sodium 1 % Gel Commonly known as: VOLTAREN Apply 1 application. topically 4 (four) times daily as needed (pain).   ezetimibe 10 MG tablet Commonly known as: ZETIA Take 10 mg by mouth daily.   fluconazole 150 MG tablet Commonly known as: DIFLUCAN Take 1 tablet by mouth daily as needed (For flair up).   HumaLOG KwikPen 100 UNIT/ML KwikPen Generic drug: insulin lispro Inject 12 Units into the skin 2 (two) times daily with a meal.   Jardiance 25 MG Tabs tablet Generic drug: empagliflozin Take 25 mg by mouth daily.   Lantus SoloStar 100 UNIT/ML Solostar Pen Generic drug: insulin glargine Inject 38 Units into the skin at bedtime.   levETIRAcetam 750 MG 24 hr tablet Commonly known as: KEPPRA XR TAKE 2 TABLETS (1,500 MG TOTAL) BY MOUTH AT BEDTIME.   lidocaine 5 % Commonly known as: LIDODERM Place 1 patch onto the skin daily.   losartan 50 MG tablet Commonly known as: COZAAR Take 50 mg by mouth daily.   multivitamins ther. w/minerals Tabs tablet Take 1 tablet by mouth daily.   mupirocin ointment 2 % Commonly known as: BACTROBAN Apply 1 Application topically 3 (three) times daily.   nitroGLYCERIN 0.4 MG SL tablet Commonly known as: NITROSTAT Place 0.4 mg under the tongue every 5 (five)  minutes as needed for chest pain.   pioglitazone 15 MG tablet Commonly known as: ACTOS Take 15 mg  by mouth daily.   pregabalin 50 MG capsule Commonly known as: LYRICA TAKE 1 CAPSULE BY MOUTH EVERY NIGHT What changed: See the new instructions.   rosuvastatin 40 MG tablet Commonly known as: CRESTOR Take 40 mg by mouth daily.   sertraline 100 MG tablet Commonly known as: ZOLOFT Take 1 tablet (100 mg total) by mouth 2 (two) times daily.   torsemide 20 MG tablet Commonly known as: DEMADEX Take 20 mg by mouth daily. TAKE EXTRA TABLET AS NEEDED          Outstanding Labs/Studies   none  Duration of Discharge Encounter   Greater than 30 minutes including physician time.  Mable Fill, PA-C 01/02/2022, 9:50 AM [phone]  Patient seen, examined. Available data reviewed. Agree with findings, assessment, and plan as outlined by Nell Range, PA-C.  [name] independently interviewed and examined.  She is alert, oriented, no distress.  Her family is at the bedside.  HEENT is normal, lungs are clear, heart is regular rate and rhythm with no murmur gallop, abdomen is soft and nontender, bilateral groin sites are clear.  The patient has remained hemodynamically stable overnight.  Her labs are reviewed and her hemoglobin is stable.  She has a mild leukocytosis but white blood cell count is trending down.  She has had no further fever.  I suspect she might of had a mild reaction to blood transfusion.  She seems to be doing very well and is medically stable for hospital discharge today.  I agree with the medications as outlined above.  Postoperative restrictions are discussed with the patient and her family who are present today.  Follow-up is arranged.  Sherren Mocha, M.D. 01/02/2022 4:01 PM

## 2021-12-31 NOTE — Anesthesia Procedure Notes (Signed)
Procedure Name: MAC Date/Time: 12/31/2021 7:33 AM  Performed by: Mariea Clonts, CRNAPre-anesthesia Checklist: Emergency Drugs available, Patient identified, Suction available, Patient being monitored and Timeout performed Patient Re-evaluated:Patient Re-evaluated prior to induction Oxygen Delivery Method: Simple face mask

## 2021-12-31 NOTE — Interval H&P Note (Signed)
History and Physical Interval Note:  12/31/2021 6:43 AM  [name]  has presented today for surgery, with the diagnosis of Severe Aortic Stenosis.  The various methods of treatment have been discussed with the patient and family. After consideration of risks, benefits and other options for treatment, the patient has consented to  Procedure(s): Transcatheter Aortic Valve Replacement, Transfemoral (Right) INTRAOPERATIVE TRANSTHORACIC ECHOCARDIOGRAM (N/A) as a surgical intervention.  The patient's history has been reviewed, patient examined, no change in status, stable for surgery.  I have reviewed the patient's chart and labs.  Questions were answered to the patient's satisfaction.     Gaye Pollack

## 2021-12-31 NOTE — Progress Notes (Signed)
  [place] VALVE TEAM  Patient doing well s/p TAVR. She is hemodynamically stable but BP is soft. She has received 500 cc of fluid. Hg noted to be 7.1 intraoperatively and given 1U PRBCs. Groin sites stable. ECG with sinus and old LBBB but no high grade block. She did have a very brief period of CHB during valve deployment. Plan to DC arterial line and transfer to 4E. Hopeful discharge over the next 24-48 hours.   Angelena Form PA-C  [facility]  Pager [phone]

## 2021-12-31 NOTE — Transfer of Care (Signed)
Immediate Anesthesia Transfer of Care Note  Patient: [name]  Procedure(s) Performed: Transcatheter Aortic Valve Replacement, Transfemoral (Right: Chest) INTRAOPERATIVE TRANSTHORACIC ECHOCARDIOGRAM  Patient Location: PACU  Anesthesia Type:MAC  Level of Consciousness: awake, alert  and oriented  Airway & Oxygen Therapy: Patient Spontanous Breathing and Patient connected to face mask oxygen  Post-op Assessment: Report given to RN, Post -op Vital signs reviewed and stable and Patient moving all extremities X 4  Post vital signs: Reviewed and stable  Last Vitals:  Vitals Value Taken Time  BP 96/45 12/31/21 0937  Temp    Pulse 60 12/31/21 0939  Resp 15 12/31/21 0939  SpO2 99 % 12/31/21 0939  Vitals shown include unvalidated device data.  Last Pain:  Vitals:   12/31/21 0941  TempSrc:   PainSc: 0-No pain      Patients Stated Pain Goal: 3 (56/31/49 7026)  Complications: No notable events documented.

## 2021-12-31 NOTE — Op Note (Signed)
[facility]   MULTIDISCIPLINARY HEART VALVE TEAM   TAVR OPERATIVE NOTE   Date of Procedure:  12/31/2021  Preoperative Diagnosis: Severe Aortic Stenosis   Postoperative Diagnosis: Same   Procedure:   Transcatheter Aortic Valve Replacement - Percutaneous  Transfemoral Approach  Edwards Sapien 3 Ultra Resilia THV (size 23 mm, serial # [id number])   Co-Surgeons:  Gaye Pollack, MD and Sherren Mocha, MD  Anesthesiologist:  Adele Barthel, MD  Echocardiographer:  Jenkins Rouge, MD  Pre-operative Echo Findings: Severe aortic stenosis Mild left ventricular systolic dysfunction LVEF 45%  Post-operative Echo Findings: No paravalvular leak Unchanged left ventricular systolic function  BRIEF CLINICAL NOTE AND INDICATIONS FOR SURGERY  66 yo woman presenting for TAVR. She has been followed for aortic stenosis and has been noted to have progressive aortic stenosis over the last year.  She has a history of coronary artery disease status post multivessel CABG in 2009, below-knee amputation of the right leg in 2008 and below-knee amputation of the left leg in 2015.  She also has type 2 diabetes, peripheral neuropathy, and stage IIIb chronic kidney disease.  She was found to have elevated troponin felt to be consistent with demand ischemia in December 2022 when she was hospitalized with respiratory failure during COVID infection.  She did not undergo cardiac evaluation at that time because of her kidney disease.  TEE was done which demonstrated partial fusion of the left and right cusps of the aortic valve with a peak velocity of 3.5 m/s, mean gradient 28 mmHg, and calculated aortic valve area of 0.73 cm.  She was also found to have degenerative mitral regurgitation felt to be moderate in severity.  The patient returns today with her family after undergoing right and left heart catheterization as she wanted to discuss TAVR again before proceeding with any further studies.  Her cardiac  cath study showed multivessel CAD with total occlusion of the LAD, left circumflex, and right coronary arteries.  She had continued patency of the LIMA to LAD and free left radial graft to obtuse marginal.  Her vein graft to the right coronary artery and diagonal were occluded and there was extensive collateralization of the RCA branches from left to right collaterals.  She had a mean transaortic gradient of 25 mmHg, peak to peak gradient 31 mmHg, and calculated aortic valve area of 1.4 cm.  She reports no recent chest pain or shortness of breath.  She remains an active but gets around some with a walker.  She has no orthopnea, PND, lightheadedness, or syncope.  During the course of the patient's preoperative work up they have been evaluated comprehensively by a multidisciplinary team of specialists coordinated through the [facility] the [facility].  They have been demonstrated to suffer from symptomatic severe aortic stenosis as noted above. The patient has been counseled extensively as to the relative risks and benefits of all options for the treatment of severe aortic stenosis including long term medical therapy, conventional surgery for aortic valve replacement, and transcatheter aortic valve replacement.  The patient has been independently evaluated in formal cardiac surgical consultation by Dr Cyndia Bent, who deemed the patient appropriate for TAVR. Based upon review of all of the patient's preoperative diagnostic tests they are felt to be candidate for transcatheter aortic valve replacement using the transfemoral approach as an alternative to conventional surgery.    Following the decision to proceed with transcatheter aortic valve replacement, a discussion has been held  regarding what types of management strategies would be attempted intraoperatively in the event of life-threatening complications, including whether or not the patient would be  considered a candidate for the use of cardiopulmonary bypass and/or conversion to open sternotomy for attempted surgical intervention.  The patient has been advised of a variety of complications that might develop peculiar to this approach including but not limited to risks of death, stroke, paravalvular leak, aortic dissection or other major vascular complications, aortic annulus rupture, device embolization, cardiac rupture or perforation, acute myocardial infarction, arrhythmia, heart block or bradycardia requiring permanent pacemaker placement, congestive heart failure, respiratory failure, renal failure, pneumonia, infection, other late complications related to structural valve deterioration or migration, or other complications that might ultimately cause a temporary or permanent loss of functional independence or other long term morbidity.  The patient provides full informed consent for the procedure as described and all questions were answered preoperatively.  DETAILS OF THE OPERATIVE PROCEDURE  PREPARATION:   [name] brought to the operating room on the above mentioned date and central monitoring was established by the anesthesia team including placement of a radial arterial line. [name] placed in the supine position on the operating table.  Intravenous antibiotics are administered. [name] monitored closely throughout the procedure under conscious sedation.  Baseline transthoracic echocardiogram is performed. The patient's chest, abdomen, both groins, and both lower extremities are prepared and draped in a sterile manner. A time out procedure is performed.   PERIPHERAL ACCESS:   Using ultrasound guidance, femoral arterial and venous access is obtained with placement of 6 Fr sheaths on the left and right sides, respectively.  [place] images are digitally captured and stored in the patient's chart. A pigtail diagnostic catheter was passed through the femoral arterial sheath under  fluoroscopic guidance into the aortic root.  A temporary transvenous pacemaker catheter was passed through the femoral venous sheath under fluoroscopic guidance into the right ventricle.  The pacemaker was tested to ensure stable lead placement and pacemaker capture. Aortic root angiography was performed in order to determine the optimal angiographic angle for valve deployment.  TRANSFEMORAL ACCESS:  A micropuncture technique is used to access the right femoral artery under fluoroscopic and ultrasound guidance.  2 Perclose devices are deployed at 10' and 2' positions to 'PreClose' the femoral artery. An 8 French sheath is placed and then an Amplatz Superstiff wire is advanced through the sheath. This is changed out for a 14 French transfemoral E-Sheath after progressively dilating over the Superstiff wire.  An AL-1 catheter was used to direct a straight-tip exchange length wire across the native aortic valve into the left ventricle. This was exchanged out for a pigtail catheter and position was confirmed in the LV apex. Simultaneous LV and Ao pressures were recorded.  The pigtail catheter was exchanged for an Amplatz Extra-stiff wire in the LV apex.    BALLOON AORTIC VALVULOPLASTY:  Not performed  TRANSCATHETER HEART VALVE DEPLOYMENT:  An Edwards Sapien 3 transcatheter heart valve (size 23 mm) was prepared and crimped per manufacturer's guidelines, and the proper orientation of the valve is confirmed on the [employer] delivery system. The valve was advanced through the introducer sheath using normal technique until in an appropriate position in the abdominal aorta beyond the sheath tip. The balloon was then retracted and using the fine-tuning wheel was centered on the valve. The valve was then advanced across the aortic arch using appropriate flexion of the catheter. The valve was carefully positioned across  the aortic valve annulus. The Commander catheter was retracted using normal technique. Once  final position of the valve has been confirmed by angiographic assessment, the valve is deployed while temporarily holding ventilation and during rapid ventricular pacing to maintain systolic blood pressure < 50 mmHg and pulse pressure < 10 mmHg. The balloon inflation is held for >3 seconds after reaching full deployment volume. Once the balloon has fully deflated the balloon is retracted into the ascending aorta and valve function is assessed using echocardiography. The patient's hemodynamic recovery following valve deployment is good.  There is temporary loss of AV conduction requiring a short duration of ventricular pacing. AV conduction returns to 1:1 quickly and pacing is no longer needed. The deployment balloon and guidewire are both removed. Echo demostrated acceptable post-procedural gradients, stable mitral valve function, and no aortic insufficiency.    PROCEDURE COMPLETION:  The sheath was removed and femoral artery closure is performed using the 2 previously deployed Perclose devices.  Protamine is administered once femoral arterial repair was complete. The site is clear with no evidence of bleeding or hematoma after the sutures are tightened. The temporary pacemaker and pigtail catheters are removed. Pefclose is used for contralateral left femoral arterial hemostasis for the 6 Fr sheath.  The patient tolerated the procedure well and is transported to the recovery area in stable condition. There were no immediate intraoperative complications. All sponge instrument and needle counts are verified correct at completion of the operation.   The patient received a total of 40 mL of diluted intravenous contrast during the procedure.   Sherren Mocha, MD 12/31/2021 1:23 PM

## 2021-12-31 NOTE — Op Note (Signed)
[facility]   MULTIDISCIPLINARY HEART VALVE TEAM   TAVR OPERATIVE NOTE   Date of Procedure:  12/31/2021  Preoperative Diagnosis: Severe Aortic Stenosis   Postoperative Diagnosis: Same   Procedure:   Transcatheter Aortic Valve Replacement - Percutaneous Right Transfemoral Approach  Edwards Sapien 3 Ultra Resilia THV (size 23 mm, model # 9755RSL, serial # [id number])   Co-Surgeons:  Gaye Pollack, MD and Sherren Mocha, MD    Anesthesiologist:  Adele Barthel, MD  Echocardiographer:  Jenkins Rouge, MD  Pre-operative Echo Findings: Severe aortic stenosis Mild left ventricular systolic dysfunction  Post-operative Echo Findings: No paravalvular leak Unchanged mild left ventricular systolic dysfunction   BRIEF CLINICAL NOTE AND INDICATIONS FOR SURGERY   This 66 year old woman has stage D2, low-flow/low gradient severe aortic stenosis with NYHA class II symptoms of exertional fatigue and shortness of breath consistent with chronic diastolic congestive heart failure.  She has had multiple hospitalizations for congestive heart failure exacerbation over the past year.  I have personally reviewed her  echocardiogram, cardiac catheterization, and CTA studies.  Her echocardiogram shows a severely calcified and thickened aortic valve with restricted leaflet mobility.  The mean gradient is 28 mmHg with a valve area of 0.73 cm and dimensionless index of 0.23.  Cardiac catheterization shows severe native three-vessel coronary disease with total occlusion of the LAD, left circumflex, and right coronary arteries.  There is continued patency of the LIMA to the LAD and left radial artery graft to the OM.  There is total occlusion of the saphenous vein graft to the RCA and diagonal branch with extensive collateral flow to the right coronary artery from the left.  The mean gradient across the aortic valve was measured at 25 mmHg.  I agree that aortic valve replacement is indicated in  this patient for relief of her symptoms and to prevent progressive left ventricular dysfunction.  Given her comorbidities of prior CABG, stage IIIb chronic kidney disease, diabetes, and bilateral lower extremity amputation I do not think she would be a candidate for open surgical aortic valve placement.  I think that transcatheter aortic valve replacement would be a reasonable alternative for her.  Her gated cardiac CTA shows anatomy suitable for TAVR using a SAPIEN 3 valve.  Her abdominal and pelvic CTA shows adequate pelvic vascular anatomy to allow transfemoral insertion.   The patient and her son were counseled at length regarding treatment alternatives for management of severe symptomatic aortic stenosis. The risks and benefits of surgical intervention has been discussed in detail. Long-term prognosis with medical therapy was discussed. Alternative approaches such as conventional surgical aortic valve replacement, transcatheter aortic valve replacement, and palliative medical therapy were compared and contrasted at length. This discussion was placed in the context of the patient's own specific clinical presentation and past medical history. All of their questions have been addressed.    Following the decision to proceed with transcatheter aortic valve replacement, a discussion was held regarding what types of management strategies would be attempted intraoperatively in the event of life-threatening complications, including whether or not the patient would be considered a candidate for the use of cardiopulmonary bypass and/or conversion to open sternotomy for attempted surgical intervention.  I do not think she is a candidate for emergent sternotomy to manage any intraoperative complications.  [name] aware of the fact that transient use of cardiopulmonary bypass may be necessary. The patient has been advised of a variety of complications that might develop including but not limited to  risks of death,  stroke, paravalvular leak, aortic dissection or other major vascular complications, aortic annulus rupture, device embolization, cardiac rupture or perforation, mitral regurgitation, acute myocardial infarction, arrhythmia, heart block or bradycardia requiring permanent pacemaker placement, congestive heart failure, respiratory failure, renal failure, pneumonia, infection, other late complications related to structural valve deterioration or migration, or other complications that might ultimately cause a temporary or permanent loss of functional independence or other long term morbidity. The patient provides full informed consent for the procedure as described and all questions were answered.     DETAILS OF THE OPERATIVE PROCEDURE  PREPARATION:    The patient was brought to the operating room on the above mentioned date and appropriate monitoring was established by the anesthesia team. The patient was placed in the supine position on the operating table.  Intravenous antibiotics were administered. The patient was monitored closely throughout the procedure under conscious sedation.    Baseline transthoracic echocardiogram was performed. The patient's abdomen and both groins were prepped and draped in a sterile manner. A time out procedure was performed.   PERIPHERAL ACCESS:    Using the modified Seldinger technique, femoral arterial and venous access was obtained with placement of a 6 Fr sheath in the left femoral artery and a 6 Fr sheath in the right femoral vein.  A pigtail diagnostic catheter was passed through the left arterial sheath under fluoroscopic guidance into the aortic root.  A temporary transvenous pacemaker catheter was passed through the right femoral venous sheath under fluoroscopic guidance into the right ventricle.  The pacemaker was tested to ensure stable lead placement and pacemaker capture. Aortic root angiography was performed in order to determine the optimal angiographic angle  for valve deployment.   TRANSFEMORAL ACCESS:   Percutaneous transfemoral access and sheath placement was performed using ultrasound guidance.  The right common femoral artery was cannulated using a micropuncture needle and appropriate location was verified using hand injection angiogram.  A pair of [employer] Perclose percutaneous closure devices were placed and a 6 French sheath replaced into the femoral artery.  The patient was heparinized systemically and ACT verified > 250 seconds.    A 14 Fr transfemoral E-sheath was introduced into the right common femoral artery after progressively dilating over an Amplatz superstiff wire. An AL-1 catheter was used to direct a straight-tip exchange length wire across the native aortic valve into the left ventricle. This was exchanged out for a pigtail catheter and position was confirmed in the LV apex. Simultaneous LV and Ao pressures were recorded.  The pigtail catheter was exchanged for a [place] wire in the LV apex.   BALLOON AORTIC VALVULOPLASTY:   Not performed    TRANSCATHETER HEART VALVE DEPLOYMENT:   An Edwards Sapien 3 Ultra transcatheter heart valve (size 23 mm) was prepared and crimped per manufacturer's guidelines, and the proper orientation of the valve is confirmed on the [employer] delivery system. The valve was advanced through the introducer sheath using normal technique until in an appropriate position in the abdominal aorta beyond the sheath tip. The balloon was then retracted and using the fine-tuning wheel was centered on the valve. The valve was then advanced across the aortic arch using appropriate flexion of the catheter. The valve was carefully positioned across the aortic valve annulus. The Commander catheter was retracted using normal technique. Once final position of the valve has been confirmed by angiographic assessment, the valve is deployed during rapid ventricular pacing to maintain systolic blood pressure < 50 mmHg and  pulse  pressure < 10 mmHg. The balloon inflation is held for >3 seconds after reaching full deployment volume. Once the balloon has fully deflated the balloon is retracted into the ascending aorta and valve function is assessed using echocardiography. There is felt to be no paravalvular leak and no central aortic insufficiency.  The patient's hemodynamic recovery following valve deployment is good.  The deployment balloon and guidewire are both removed.    PROCEDURE COMPLETION:   The sheath was removed and femoral artery closure performed.  Protamine was administered once femoral arterial repair was complete. The temporary pacemaker, pigtail catheter and femoral sheaths were removed with manual pressure used for venous hemostasis.  A Perclose femoral closure device was utilized following removal of the diagnostic sheath in the left femoral artery.  The patient tolerated the procedure well and is transported to the cath lab recovery area in stable condition. There were no immediate intraoperative complications. All sponge instrument and needle counts are verified correct at completion of the operation.   No blood products were administered during the operation.  The patient received a total of 40 mL of intravenous contrast during the procedure.   Gaye Pollack, MD 12/31/2021

## 2021-12-31 NOTE — Anesthesia Postprocedure Evaluation (Signed)
Anesthesia Post Note  Patient: [name]  Procedure(s) Performed: Transcatheter Aortic Valve Replacement, Transfemoral (Right: Chest) INTRAOPERATIVE TRANSTHORACIC ECHOCARDIOGRAM     Patient location during evaluation: Cath Lab Anesthesia Type: MAC Level of consciousness: awake Pain management: pain level controlled Vital Signs Assessment: post-procedure vital signs reviewed and stable Respiratory status: spontaneous breathing, nonlabored ventilation, respiratory function stable and patient connected to nasal cannula oxygen Cardiovascular status: stable and blood pressure returned to baseline Postop Assessment: no apparent nausea or vomiting Anesthetic complications: no   No notable events documented.  Last Vitals:  Vitals:   12/31/21 1530 12/31/21 1545  BP: (!) 105/41 (!) 112/51  Pulse: 61 63  Resp: 13 10  Temp:    SpO2: 99% 100%    Last Pain:  Vitals:   12/31/21 0941  TempSrc:   PainSc: 0-No pain                 Sahas Sluka P Carl Butner

## 2022-01-01 ENCOUNTER — Inpatient Hospital Stay (HOSPITAL_COMMUNITY): Payer: Medicare Other

## 2022-01-01 ENCOUNTER — Encounter (HOSPITAL_COMMUNITY): Payer: Self-pay | Admitting: Cardiovascular Disease

## 2022-01-01 DIAGNOSIS — Z952 Presence of prosthetic heart valve: Secondary | ICD-10-CM | POA: Diagnosis not present

## 2022-01-01 DIAGNOSIS — I350 Nonrheumatic aortic (valve) stenosis: Secondary | ICD-10-CM

## 2022-01-01 LAB — ECHOCARDIOGRAM COMPLETE
AR max vel: 1.87 cm2
AV Area VTI: 1.86 cm2
AV Area mean vel: 2.1 cm2
AV Mean grad: 7 mmHg
AV Peak grad: 16 mmHg
Ao pk vel: 2 m/s
Area-P 1/2: 3.24 cm2
Height: 65 in
MV VTI: 1.48 cm2
S' Lateral: 3.1 cm
Weight: 2680.79 oz

## 2022-01-01 LAB — BASIC METABOLIC PANEL
Anion gap: 8 (ref 5–15)
BUN: 24 mg/dL — ABNORMAL HIGH (ref 8–23)
CO2: 22 mmol/L (ref 22–32)
Calcium: 8.1 mg/dL — ABNORMAL LOW (ref 8.9–10.3)
Chloride: 104 mmol/L (ref 98–111)
Creatinine, Ser: 1.66 mg/dL — ABNORMAL HIGH (ref 0.44–1.00)
GFR, Estimated: 34 mL/min — ABNORMAL LOW (ref >60)
Glucose, Bld: 145 mg/dL — ABNORMAL HIGH (ref 70–99)
Potassium: 4.8 mmol/L (ref 3.5–5.1)
Sodium: 134 mmol/L — ABNORMAL LOW (ref 135–145)

## 2022-01-01 LAB — GLUCOSE, CAPILLARY
Glucose-Capillary: 127 mg/dL — ABNORMAL HIGH (ref 70–99)
Glucose-Capillary: 199 mg/dL — ABNORMAL HIGH (ref 70–99)
Glucose-Capillary: 226 mg/dL — ABNORMAL HIGH (ref 70–99)
Glucose-Capillary: 247 mg/dL — ABNORMAL HIGH (ref 70–99)

## 2022-01-01 LAB — CBC
HCT: 25.4 % — ABNORMAL LOW (ref 36.0–46.0)
Hemoglobin: 7.9 g/dL — ABNORMAL LOW (ref 12.0–15.0)
MCH: 25.2 pg — ABNORMAL LOW (ref 26.0–34.0)
MCHC: 31.1 g/dL (ref 30.0–36.0)
MCV: 81.2 fL (ref 80.0–100.0)
Platelets: 190 10*3/uL (ref 150–400)
RBC: 3.13 MIL/uL — ABNORMAL LOW (ref 3.87–5.11)
RDW: 17.6 % — ABNORMAL HIGH (ref 11.5–15.5)
WBC: 12.6 10*3/uL — ABNORMAL HIGH (ref 4.0–10.5)
nRBC: 0 % (ref 0.0–0.2)

## 2022-01-01 LAB — MAGNESIUM: Magnesium: 2.1 mg/dL (ref 1.7–2.4)

## 2022-01-01 MED ORDER — CARVEDILOL 6.25 MG PO TABS
6.2500 mg | ORAL_TABLET | Freq: Two times a day (BID) | ORAL | Status: DC
  Administered 2022-01-01 – 2022-01-02 (×3): 6.25 mg via ORAL
  Filled 2022-01-01 (×3): qty 1

## 2022-01-01 NOTE — TOC Initial Note (Addendum)
Transition of Care [facility]) - Initial/Assessment Note    Patient Details  Name: [name] MRN: [id number] Date of Birth: [birth year]  Transition of Care [facility]) CM/SW Contact:    Bethena Roys, RN Phone Number: 01/01/2022, 1:12 PM  Clinical Narrative: Patient POD-1 TAVR. PTA patient states she was from home with spouse and two adult children. Patient states she has a PCP and gets to appointments without any issues. Patient states she has DME: wheelchair, rolling walker, bedside commode, and shower chair. No new DME needs identified at the time of the visit. Case Manager will continue to follow for transition of care needs as the patient progresses.                  Expected Discharge Plan: Home/Self Care Barriers to Discharge: Continued Medical Work up   Patient Goals and CMS Choice Patient states their goals for this hospitalization and ongoing recovery are:: plans to return home.      Expected Discharge Plan and Services Expected Discharge Plan: Home/Self Care In-house Referral: NA Discharge Planning Services: CM Consult Post Acute Care Choice: NA Living arrangements for the past 2 months: Single Family Home                 DME Arranged: N/A DME Agency: NA   Prior Living Arrangements/Services Living arrangements for the past 2 months: Single Family Home Lives with:: Spouse, Adult Children Patient language and need for interpreter reviewed:: Yes        Need for Family Participation in Patient Care: Yes (Comment) Care giver support system in place?: Yes (comment) Current home services: DME (Patient has toilet riser, wheelchair, rolling walker, bedside commode, and shower chair.) Criminal Activity/Legal Involvement Pertinent to Current Situation/Hospitalization: No - Comment as needed  Activities of Daily Living Home Assistive Devices/Equipment: Eyeglasses, Environmental consultant (specify type), Wheelchair, CBG Meter, Blood pressure cuff, Shower chair with back ADL  Screening (condition at time of admission) Patient's cognitive ability adequate to safely complete daily activities?: Yes Is the patient deaf or have difficulty hearing?: No Does the patient have difficulty seeing, even when wearing glasses/contacts?: No Does the patient have difficulty concentrating, remembering, or making decisions?: No Patient able to express need for assistance with ADLs?: Yes Does the patient have difficulty dressing or bathing?: Yes Independently performs ADLs?: No Communication: Independent Dressing (OT): Independent Grooming: Independent Feeding: Independent Bathing: Needs assistance Is this a change from baseline?: Pre-admission baseline Toileting: Independent In/Out Bed: Independent Walks in Home: Independent with device (comment) Does the patient have difficulty walking or climbing stairs?: Yes Weakness of Legs: Both Weakness of Arms/Hands: None  Permission Sought/Granted Permission sought to share information with : Case Manager, Family Supports   Emotional Assessment Appearance:: Appears stated age Attitude/Demeanor/Rapport: Engaged Affect (typically observed): Appropriate Orientation: : Oriented to Self, Oriented to Place, Oriented to  Time, Oriented to Situation Alcohol / Substance Use: Not Applicable Psych Involvement: No (comment)  Admission diagnosis:  S/P TAVR (transcatheter aortic valve replacement) [Z95.2] Patient Active Problem List   Diagnosis Date Noted   S/P TAVR (transcatheter aortic valve replacement) 12/31/2021   HFrEF (heart failure with reduced ejection fraction) ([place])    Acute kidney injury superimposed on chronic kidney disease IV ([place]) 04/21/2021   Hematochezia 04/21/2021   Nonrheumatic aortic valve stenosis 01/17/2021   Chronic kidney disease, stage 3b ([place]) 01/17/2021   Generalized anxiety disorder with panic attacks    S/P CABG x 4 03/29/2018   S/P below-knee amputation of both lower extremities 03/29/2018  Gastroesophageal reflux disease 07/09/2016   Chronic pain syndrome 09/25/2015   Seizure disorder ([place]) 09/02/2013   Protein-calorie malnutrition, severe ([place]) 06/20/2013   PVD (peripheral vascular disease) ([place]) 03/25/2012   Uncontrolled type 2 diabetes mellitus with hyperglycemia, with long-term current use of insulin ([place]) 03/14/2008   Hyperlipidemia, unspecified 03/14/2008   Acute on chronic anemia , possible acute blood loss anemia 03/14/2008   Essential hypertension 03/14/2008   CAD (coronary artery disease) of artery bypass graft 03/14/2008   Carotid stenosis 03/14/2008   History of carotid endarterectomy, right 03/14/2008   History of cholecystectomy, laparoscopic 03/14/2008   PCP:  [name], MD Pharmacy:   [facility] [place] [place] [address] Phone: [phone] Fax: [phone]  [id number] [place] [address]Phone: [phone]: [phone]  Social Determinants of Health (SDOH) Interventions Food Insecurity Interventions: Intervention Not Indicated Housing Interventions: Intervention Not Indicated  Readmission Risk Interventions    04/24/2021   11:40 AM  Readmission Risk Prevention Plan  Transportation Screening Complete  PCP or Specialist Appt within 3-5 Days Complete  Social Work Consult for RCelebrationPlanning/Counseling Complete  Palliative Care Screening Not Applicable  Medication Review (Press photographer Complete

## 2022-01-01 NOTE — Progress Notes (Signed)
CARDIAC REHAB PHASE I     Pt resting in bed feeling well today. Pt is mostly wheelchair bond so unable to ambulate today. Pt denies post op pain or sob. Post op home education begun. Included today,  home needs at discharge, exercise guidelines, site care, and heart healthy diabetic diet reviewed. Will continue to follow.   1030-1100  Vanessa Barbara, RN BSN 01/01/2022 11:00 AM

## 2022-01-01 NOTE — Progress Notes (Addendum)
[place] VALVE TEAM  Patient Name: [name] Date of Encounter: 01/01/2022  Admit date: 12/31/2021  Primary Care Provider: [name], MD [facility] Cardiologist: Minus Breeding, MD / Dr. Burt Knack & Dr. Cyndia Bent (TAVR) [facility] Electrophysiologist:  None   Hospital Problem List     Principal Problem:   S/P TAVR (transcatheter aortic valve replacement) Active Problems:   Uncontrolled type 2 diabetes mellitus with hyperglycemia, with long-term current use of insulin (HCC)   Essential hypertension   CAD (coronary artery disease) of artery bypass graft   History of carotid endarterectomy, right   PVD (peripheral vascular disease) (HCC)   Protein-calorie malnutrition, severe (HCC)   Chronic pain syndrome   Gastroesophageal reflux disease   S/P CABG x 4   S/P below-knee amputation of both lower extremities   Severe aortic stenosis   Chronic kidney disease, stage 3b (HCC)   Acute kidney injury superimposed on chronic kidney disease IV (HCC)   HFrEF (heart failure with reduced ejection fraction) (HCC)     Subjective   Has low grade temp. Feels a little hot and cold with general malaise. No cough. No burning with urination or frequency.   Inpatient Medications    Scheduled Meds:  sodium chloride   Intravenous Once   aspirin EC  81 mg Oral Daily   carvedilol  6.25 mg Oral BID   Chlorhexidine Gluconate Cloth  6 each Topical Daily   clopidogrel  75 mg Oral Daily   empagliflozin  25 mg Oral Daily   ezetimibe  10 mg Oral Daily   insulin aspart  0-24 Units Subcutaneous TID AC & HS   levETIRAcetam  1,500 mg Oral QHS   rosuvastatin  40 mg Oral Daily   sertraline  100 mg Oral BID   sodium chloride flush  3 mL Intravenous Q12H   torsemide  20 mg Oral Daily   Continuous Infusions:  sodium chloride     PRN Meds: sodium chloride, acetaminophen-codeine, ALPRAZolam, ondansetron (ZOFRAN) IV, pregabalin, sodium  chloride flush   Vital Signs    Vitals:   01/01/22 0600 01/01/22 0700 01/01/22 0736 01/01/22 0800  BP:  (!) 141/65  95/82  Pulse:  (!) 103 (!) 104 (!) 104  Resp:  15 20 (!) 24  Temp: (!) 100.6 F (38.1 C)     TempSrc: Oral     SpO2:  93% 95% 93%  Weight:      Height:        Intake/Output Summary (Last 24 hours) at 01/01/2022 0931 Last data filed at 01/01/2022 0600 Gross per 24 hour  Intake 1524.37 ml  Output 1850 ml  Net -325.63 ml   Filed Weights   12/31/21 0627 01/01/22 0500  Weight: 76.7 kg 76 kg    Physical Exam    GEN: Well nourished, well developed, in no acute distress.  HEENT: Grossly normal.  Cardiac: RR, mild tachy. no murmurs, rubs, or gallops. No clubbing, cyanosis, edema.   Respiratory:  Respirations regular and unlabored, clear to auscultation bilaterally. GI: Soft, nontender, nondistended, BS + x 4. MS: no deformity or atrophy. S/p right BKA.  Skin: warm and dry, no rash.  Groin sites clear without hematoma or ecchymosis  Neuro:  Strength and sensation are intact. Psych: AAOx3.  Normal affect.  Labs    CBC Recent Labs    12/31/21 0949 01/01/22 0445  WBC  --  12.6*  HGB 7.8* 7.9*  HCT 23.0* 25.4*  MCV  --  81.2  PLT  --  297   Basic Metabolic Panel Recent Labs    12/31/21 0949 01/01/22 0445  NA 137 134*  K 4.5 4.8  CL 101 104  CO2  --  22  GLUCOSE 210* 145*  BUN 27* 24*  CREATININE 1.80* 1.66*  CALCIUM  --  8.1*  MG  --  2.1   Liver Function Tests No results for input(s): "AST", "ALT", "ALKPHOS", "BILITOT", "PROT", "ALBUMIN" in the last 72 hours. No results for input(s): "LIPASE", "AMYLASE" in the last 72 hours. Cardiac Enzymes No results for input(s): "CKTOTAL", "CKMB", "CKMBINDEX", "TROPONINI" in the last 72 hours. BNP Invalid input(s): "POCBNP" D-Dimer No results for input(s): "DDIMER" in the last 72 hours. Hemoglobin A1C No results for input(s): "HGBA1C" in the last 72 hours. Fasting Lipid Panel No results for  input(s): "CHOL", "HDL", "LDLCALC", "TRIG", "CHOLHDL", "LDLDIRECT" in the last 72 hours. Thyroid Function Tests No results for input(s): "TSH", "T4TOTAL", "T3FREE", "THYROIDAB" in the last 72 hours.  Invalid input(s): "FREET3"  Telemetry    sinus - Personally Reviewed  ECG    Sinus with old LBBB, HR 60 - Personally Reviewed  Radiology    ECHOCARDIOGRAM LIMITED  Result Date: 12/31/2021    ECHOCARDIOGRAM LIMITED REPORT   Patient Name:   [name] Date of Exam: 12/31/2021 Medical Rec #:  [id number]        Height:       65.0 in Accession #:    [id number]       Weight:       169.0 lb Date of Birth:  [birth year]         BSA:          1.841 m Patient Age:    66 years         BP:           123/59 mmHg Patient Gender: F                HR:           62 bpm. Exam Location:  Inpatient Procedure: Limited Echo, Color Doppler and Cardiac Doppler Indications:     Aortic Stenosis i35.0  History:         Patient has prior history of Echocardiogram examinations, most                  recent 07/31/2021. Prior CABG; Risk Factors:Hypertension,                  Diabetes and Dyslipidemia.  Sonographer:     Raquel Sarna Senior RDCS Referring Phys:  [id number] Woodfin Ganja THOMPSON Diagnosing Phys: Jenkins Rouge MD  Sonographer Comments: 75m Edwards S3U TAVR Implanted IMPRESSIONS  1. Septal and apical hypokinesis . Left ventricular ejection fraction, by estimation, is 40 to 45%. The left ventricle has mildly decreased function. The left ventricle demonstrates regional wall motion abnormalities (see scoring diagram/findings for description). The left ventricular internal cavity size was mildly dilated.  2. Not fully evaluated with doppler . The mitral valve is abnormal. Mild mitral valve regurgitation. Severe mitral annular calcification.  3. Pre TAVR : sever low flor AS mean gradient 20 peak 33 mmHg AVA 0.78 cm2     Post TAVR: well placed 23 mm Sapien 3 valve no PVL mean gradient 4 peak 7 mmHg AVA 1.74 cm2. The aortic valve has been  repaired/replaced. FINDINGS  Left Ventricle: Septal and apical hypokinesis. Left ventricular ejection fraction, by estimation, is  40 to 45%. The left ventricle has mildly decreased function. The left ventricle demonstrates regional wall motion abnormalities. The left ventricular internal cavity size was mildly dilated. Mitral Valve: Not fully evaluated with doppler. The mitral valve is abnormal. There is moderate thickening of the mitral valve leaflet(s). There is moderate calcification of the mitral valve leaflet(s). Severe mitral annular calcification. Mild mitral valve regurgitation. Tricuspid Valve: Tricuspid valve regurgitation is mild. Aortic Valve: Pre TAVR : sever low flor AS mean gradient 20 peak 33 mmHg AVA 0.78 cm2 Post TAVR: well placed 23 mm Sapien 3 valve no PVL mean gradient 4 peak 7 mmHg AVA 1.74 cm2. The aortic valve has been repaired/replaced. Jenkins Rouge MD Electronically signed by Jenkins Rouge MD Signature Date/Time: 12/31/2021/10:00:28 AM    Final    Structural Heart Procedure  Result Date: 12/31/2021 See surgical note for result.   Cardiac Studies   TAVR OPERATIVE NOTE     Date of Procedure:                12/31/2021   Preoperative Diagnosis:      Severe Aortic Stenosis    Postoperative Diagnosis:    Same    Procedure:        Transcatheter Aortic Valve Replacement - Percutaneous  Transfemoral Approach             Edwards Sapien 3 Ultra Resilia THV (size 23 mm, serial # [id number])              Co-Surgeons:                        Gaye Pollack, MD and Sherren Mocha, MD   Anesthesiologist:                  Adele Barthel, MD   Echocardiographer:              Jenkins Rouge, MD   Pre-operative Echo Findings: Severe aortic stenosis Mild left ventricular systolic dysfunction LVEF 45%   Post-operative Echo Findings: No paravalvular leak Unchanged left ventricular systolic function   _____________     Echo 01/01/22: pending  Patient Profile     [name] is a 66 y.o. female with a history of CAD s/p CABG s/p CABG 2009, obesity, HTN, HLD, PAD s/p bilateral BKAs, DMT2, peripheral neuropathy, CKD stage IIIb, anemia, chronic systolic CHF (EF 69-48%), mitral valve disease and severe AS who presented to [facility] on 12/30/21 for planned TAVR.   Assessment & Plan    Severe AS: s/p successful TAVR with a 23 mm Edwards Sapien 3 Ultra Resilia THV via the TF approach on 12/31/21. Post operative echo pending. Groin sites are stable. ECG with sinus and old LBBB but no high grade heart block. Continued on her chronic Asprin and Plavix. Plan to keep overnight for observation. Will transfer to 4E.     HTN: BP soft post operatively requiring a levophed gtt. Now off all pressors. BP on soft side.   Sinus tachycardia: HR in low 100s. Likely secondary holding home Coreg 6.'25mg'$  BID. I have resumed this. BP still on soft side but should tolerate low dose BB.   Fever: low grade. Has some sweats and chills. White count is mildlly elevated ~12.6. Will continue to monitor.    Acute on chronic anemia: hg dropped to 7.1 pre op. She was treated with 1U PRBCs intraoperatively. Hg improved to 7.8 post op, but she was treated with an addional  unit give hypotension. Hg 7.9 today. Continue to follow.    CKD stage IIIb: creat close to baseline ~1.8-1.9.    Chronic combined S/D CHF: appears euvolemic.    PAD s/p BKA: continue medical therapy.    Protein calorie malnutrition: albumin 3.2    DMT2: continue SSI   CAD s/p CABG: pre TAVR cath showed showed multivessel CAD with total occlusion of the LAD, left circumflex, and right coronary arteries.  She had continued patency of the LIMA to LAD and free left radial graft to obtuse marginal.  Her vein graft to the right coronary artery and diagonal were occluded and there was extensive collateralization of the RCA branches from left to right collaterals  Signed, Angelena Form, PA-C  01/01/2022, 9:31 AM  Pager  [phone]  Patient seen, examined. Available data reviewed. Agree with findings, assessment, and plan as outlined by Nell Range, PA-C.  [name] independently interviewed and examined this morning.  She is alert, oriented, no distress.  HEENT is normal, lungs are clear bilaterally, heart is regular rate and rhythm with no murmur gallop, abdomen is soft and nontender, bilateral groin sites are clear with no hematoma or ecchymoses.  Patient with bilateral BKA's and sites are stable.  She had a low-grade temperature overnight and continues to complain of some sweats.  She otherwise has no complaints this morning.  I have reviewed her labs which show stability of her chronic kidney disease, signs of acute on chronic anemia with hemoglobin 7.9 today after 1 unit of packed red blood cells last night.  There is no evidence of active bleeding anywhere.  Her exam is benign.  I think we should observe her overnight, transfer her from the CV-ICU to a telemetry bed, and repeat a CBC and metabolic panel in the morning.  Her telemetry is reviewed and shows no significant arrhythmia.  Anticipate hospital discharge tomorrow if clinically stable.  Sherren Mocha, M.D. 01/01/2022 10:46 AM

## 2022-01-01 NOTE — Progress Notes (Signed)
Echocardiogram 2D Echocardiogram has been performed.  Oneal Deputy Annaleia Pence RDCS 01/01/2022, 9:34 AM  Definity attempted, patient's only IV access is infiltrated per RN. Will have to reorder limited study with contrast if clinically indicated.

## 2022-01-02 LAB — CBC
HCT: 27.4 % — ABNORMAL LOW (ref 36.0–46.0)
Hemoglobin: 8.5 g/dL — ABNORMAL LOW (ref 12.0–15.0)
MCH: 25.1 pg — ABNORMAL LOW (ref 26.0–34.0)
MCHC: 31 g/dL (ref 30.0–36.0)
MCV: 81.1 fL (ref 80.0–100.0)
Platelets: 173 10*3/uL (ref 150–400)
RBC: 3.38 MIL/uL — ABNORMAL LOW (ref 3.87–5.11)
RDW: 18.3 % — ABNORMAL HIGH (ref 11.5–15.5)
WBC: 10.7 10*3/uL — ABNORMAL HIGH (ref 4.0–10.5)
nRBC: 0 % (ref 0.0–0.2)

## 2022-01-02 LAB — BASIC METABOLIC PANEL
Anion gap: 9 (ref 5–15)
BUN: 25 mg/dL — ABNORMAL HIGH (ref 8–23)
CO2: 25 mmol/L (ref 22–32)
Calcium: 8.7 mg/dL — ABNORMAL LOW (ref 8.9–10.3)
Chloride: 102 mmol/L (ref 98–111)
Creatinine, Ser: 1.83 mg/dL — ABNORMAL HIGH (ref 0.44–1.00)
GFR, Estimated: 30 mL/min — ABNORMAL LOW (ref >60)
Glucose, Bld: 217 mg/dL — ABNORMAL HIGH (ref 70–99)
Potassium: 4.7 mmol/L (ref 3.5–5.1)
Sodium: 136 mmol/L (ref 135–145)

## 2022-01-02 LAB — GLUCOSE, CAPILLARY: Glucose-Capillary: 187 mg/dL — ABNORMAL HIGH (ref 70–99)

## 2022-01-02 MED FILL — Potassium Chloride Inj 2 mEq/ML: INTRAVENOUS | Qty: 40 | Status: AC

## 2022-01-02 MED FILL — Heparin Sodium (Porcine) Inj 1000 Unit/ML: Qty: 1000 | Status: CN

## 2022-01-02 MED FILL — Heparin Sodium (Porcine) Inj 1000 Unit/ML: Qty: 1000 | Status: AC

## 2022-01-02 MED FILL — Magnesium Sulfate Inj 50%: INTRAMUSCULAR | Qty: 10 | Status: AC

## 2022-01-02 NOTE — Progress Notes (Signed)
CARDIAC REHAB PHASE I     Pt is feeling well today and ready for discharge home. Home education including site care, risk factors, restrictions, heart healthy diabetic diet, and CRP2 reviewed. All questions and concerns addressed. Not interested in CRP2 at this time. Plan for home today.   1015-1100  Vanessa Barbara, RN BSN 01/02/2022 10:51 AM

## 2022-01-03 ENCOUNTER — Telehealth: Payer: Self-pay | Admitting: Physician Assistant

## 2022-01-03 ENCOUNTER — Telehealth: Payer: Self-pay | Admitting: Cardiology

## 2022-01-03 NOTE — Telephone Encounter (Signed)
  [facility]   MULTIDISCIPLINARY HEART VALVE TEAM  Attempted TOC call and left VM.   Angelena Form PA-C  [facility]

## 2022-01-03 NOTE — Telephone Encounter (Signed)
  [place] VALVE TEAM   Patient contacted regarding discharge from [facility] on 01/02/22   Patient understands to follow up with provider Nell Range, PA at the [address] office.  Patient understands discharge instructions? Yes  Patient understands medications and regimen? Yes  Patient understands to bring all medications to this visit? Yes   Kathyrn Drown NP-C Structural Heart Team  Pager: [phone]

## 2022-01-04 LAB — BPAM RBC
Blood Product Expiration Date: 202311112359
Blood Product Expiration Date: 202311122359
ISSUE DATE / TIME: 202310170839
ISSUE DATE / TIME: 202310170839
Unit Type and Rh: 6200
Unit Type and Rh: 6200

## 2022-01-04 LAB — TYPE AND SCREEN
ABO/RH(D): A POS
Antibody Screen: NEGATIVE
Unit division: 0
Unit division: 0

## 2022-01-07 ENCOUNTER — Ambulatory Visit: Payer: Medicare Other | Admitting: Neurology

## 2022-01-07 ENCOUNTER — Encounter: Payer: Self-pay | Admitting: Neurology

## 2022-01-07 VITALS — BP 135/73 | HR 96 | Ht 65.0 in | Wt 167.0 lb

## 2022-01-07 DIAGNOSIS — G40209 Localization-related (focal) (partial) symptomatic epilepsy and epileptic syndromes with complex partial seizures, not intractable, without status epilepticus: Secondary | ICD-10-CM

## 2022-01-07 DIAGNOSIS — G546 Phantom limb syndrome with pain: Secondary | ICD-10-CM

## 2022-01-07 MED ORDER — LEVETIRACETAM ER 750 MG PO TB24: 1500.0000 mg | ORAL_TABLET | Freq: Every day | ORAL | 3 refills | Status: DC

## 2022-01-07 MED ORDER — PREGABALIN 50 MG PO CAPS: ORAL_CAPSULE | ORAL | 2 refills | Status: DC

## 2022-01-07 NOTE — Patient Instructions (Signed)
Good to see you.  Continue Keppra XR '750mg'$ : take 2 tablets every night  2. Continue Lyrica '50mg'$ : take 1 capsule at night as needed  3. Call your PCP for the lip pain  4. Follow-up in 1 year, call for any changes   Seizure Precautions: 1. If medication has been prescribed for you to prevent seizures, take it exactly as directed.  Do not stop taking the medicine without talking to your doctor first, even if you have not had a seizure in a long time.   2. Avoid activities in which a seizure would cause danger to yourself or to others.  Don't operate dangerous machinery, swim alone, or climb in high or dangerous places, such as on ladders, roofs, or girders.  Do not drive unless your doctor says you may.  3. If you have any warning that you may have a seizure, lay down in a safe place where you can't hurt yourself.    4.  No driving for 6 months from last seizure, as per [facility].   Please refer to the following link on the [place] website for more information: http://[email or web address]/[email or web address]/drivingu.cfm   5.  Maintain good sleep hygiene.  6.  Contact your doctor if you have any problems that may be related to the medicine you are taking.  7.  Call 911 and bring the patient back to the ED if:        A.  The seizure lasts longer than 5 minutes.       B.  The patient doesn't awaken shortly after the seizure  C.  The patient has new problems such as difficulty seeing, speaking or moving  D.  The patient was injured during the seizure  E.  The patient has a temperature over 102 F (39C)  F.  The patient vomited and now is having trouble breathing

## 2022-01-07 NOTE — Progress Notes (Unsigned)
[facility] [place]                                     Cardiology Office Note:    Date:  01/08/2022   ID:  [name], DOB 03/03/1956, MRN [id number]  PCP:  Tonya Cruel, MD  [facility] Cardiologist:  Tonya Breeding, MD / Dr. Burt Foster & Dr. Cyndia Foster (TAVR) [facility] Heart[facility] Electrophysiologist:  None   Referring MD: Tonya Cruel, MD   [facility] s/p TAVR  History of Present Illness:    [name] is a 66 y.o. female with a hx of CAD s/p CABG 2009, LBBB, obesity, HTN, HLD, PAD s/p bilateral BKAs, DMT2, peripheral neuropathy, CKD stage IIIb, anemia, chronic systolic CHF (EF 72-90%), mitral valve disease and severe AS s/p TAVR (12/30/21) who presents to clinic for follow up.   She was hospitalized with respiratory failure in 02/2021 and found to be COVID-positive with elevated troponin consistent with demand ischemia.  She was then hospitalized again in February 2023 with syncope and hypotension.  She ultimately underwent a TEE 07/31/21 showing partial fusion of the right and left coronary cusps with a mean gradient 28 mmHg and dimensionless index of 0.23.  Aortic valve area was calculated at 0.73 cm.  It was felt that she most likely had severe low-flow/low gradient aortic stenosis. Cardiac cath 10/07/21 showed multivessel CAD with total occlusion of the LAD, left circumflex, and right coronary arteries.  She had continued patency of the LIMA to LAD and free left radial graft to obtuse marginal.  Her vein graft to the right coronary artery and diagonal were occluded and there was extensive collateralization of the RCA branches from left to right collaterals.  She had a mean transaortic gradient of 25 mmHg, peak to peak gradient 31 mmHg, and calculated aortic valve area of 1.4 cm.     She was evaluated by the multidisciplinary valve team and underwent successful TAVR with a 23 mm Edwards Sapien 3 Ultra Resilia THV via the TF approach on  12/31/21. Post operative echo showed EF 35-40%, normally functioning TAVR with a mean gradient of 7 mmHg and no PVL as well as moderate MAC with mild MR and moderate MS. Hg dropped to 7.1. She was treated with 2U PRBCs. She had a mild transfusion reaction with low grade fever and chills.   Today the patient presents to clinic for follow up. Here with son. Overall doing okay but still feels fatigued. No CP or SOB. No LE edema, orthopnea or PND. No dizziness or syncope. No blood in stool or urine. No palpitations.    Past Medical History:  Diagnosis Date   Anemia, normocytic normochromic 03/14/2008   Arthritis    "hands"   CAD (coronary artery disease) of artery bypass graft 03/14/2008   Chronic kidney disease (CKD) 12/26/2015   stage 3 due to type 2 diabetes mellitus   Chronic pain syndrome 09/25/2015   Coronary artery disease involving native coronary artery without angina pectoris 09/25/2015   Essential hypertension 03/14/2008   Gastroesophageal reflux disease with esophagitis 07/09/2016   Generalized anxiety disorder with panic attacks    History of one panic attack   GERD (gastroesophageal reflux disease)    takes Zantac   Hyperlipidemia, unspecified 03/14/2008   Localization-related symptomatic epilepsy and epileptic syndromes with complex partial seizures, not intractable, without status epilepticus 06/27/2014  Major depressive disorder 03/14/2008   Neuropathy    Osteomyelitis of ankle or foot, left, acute 06/17/2013   Peripheral vascular disease    PVD (peripheral vascular disease) 03/25/2012   S/P TAVR (transcatheter aortic valve replacement) 12/31/2021   s/p TAVR with a 23 mm Edwards S3UR via the TF approach by Drs [name] & Bartle   Type 2 diabetes mellitus with diabetic mononeuropathy, with long-term current use of insulin 03/14/2008    Past Surgical History:  Procedure Laterality Date   AMPUTATION Left 08/22/2013   Procedure: LEFT BELOW KNEE AMPUTATION;  Surgeon: Newt Minion, MD;  Location: [place];  Service: Orthopedics;  Laterality: Left;   ANKLE FUSION Left 05/11/2013   TIBIOCALCANEAL FUSION    ANKLE FUSION Left 05/12/2013   Procedure: LEFT TIBIOCALCANEAL FUSION;  Surgeon: Newt Minion, MD;  Location: [place];  Service: Orthopedics;  Laterality: Left;  Left Tibiocalcaneal Fusion   APPLICATION OF WOUND VAC  06/17/2013   Procedure: APPLICATION OF WOUND VAC;  Surgeon: Newt Minion, MD;  Location: [place];  Service: Orthopedics;;   BELOW KNEE LEG AMPUTATION Right Jan. 2008   BUBBLE STUDY  07/31/2021   Procedure: BUBBLE STUDY;  Surgeon: Geralynn Rile, MD;  Location: [place];  Service: Cardiovascular;;   CAROTID ENDARTERECTOMY Right    CATARACT EXTRACTION Left 12/25/14   CHOLECYSTECTOMY     COLONOSCOPY     COLONOSCOPY N/A 04/24/2021   Procedure: COLONOSCOPY;  Surgeon: [place], Benay Pike, MD;  Location: [facility];  Service: Gastroenterology;  Laterality: N/A;   CORONARY ARTERY BYPASS GRAFT  12/01/2007   "CABG X4" (05/12/2013)   DILATION AND CURETTAGE OF UTERUS     I & D EXTREMITY Left 06/17/2013   Procedure: IRRIGATION AND DEBRIDEMENT EXTREMITY, PLACEMENT ANTIBIOTIC STIMULAN BEADS;  Surgeon: Newt Minion, MD;  Location: [place];  Service: Orthopedics;  Laterality: Left;   INTRAMEDULLARY (IM) NAIL INTERTROCHANTERIC Left 08/20/2018   Procedure: INTRAMEDULLARY (IM) NAIL INTERTROCHANTRIC;  Surgeon: Shona Needles, MD;  Location: [place];  Service: Orthopedics;  Laterality: Left;   INTRAOPERATIVE TRANSTHORACIC ECHOCARDIOGRAM N/A 12/31/2021   Procedure: INTRAOPERATIVE TRANSTHORACIC ECHOCARDIOGRAM;  Surgeon: Sherren Mocha, MD;  Location: [place];  Service: Open Heart Surgery;  Laterality: N/A;   IRRIGATION AND DEBRIDEMENT ABSCESS Left 06/17/2013   ANKLE           DR [name]   RIGHT/LEFT HEART CATH AND CORONARY/GRAFT ANGIOGRAPHY N/A 10/07/2021   Procedure: RIGHT/LEFT HEART CATH AND CORONARY/GRAFT ANGIOGRAPHY;  Surgeon: Sherren Mocha, MD;  Location: [place] [facility];   Service: Cardiovascular;  Laterality: N/A;   TEE WITHOUT CARDIOVERSION N/A 07/31/2021   Procedure: TRANSESOPHAGEAL ECHOCARDIOGRAM (TEE);  Surgeon: Geralynn Rile, MD;  Location: [place];  Service: Cardiovascular;  Laterality: N/A;   TOE AMPUTATION Right    "took off a couple toes before the amputation"   TONSILLECTOMY     TRANSCATHETER AORTIC VALVE REPLACEMENT, TRANSFEMORAL Right 12/31/2021   Procedure: Transcatheter Aortic Valve Replacement, Transfemoral;  Surgeon: Sherren Mocha, MD;  Location: [place];  Service: Open Heart Surgery;  Laterality: Right;    Current Medications: Current Meds  Medication Sig   acetaminophen-codeine (TYLENOL #3) 300-30 MG tablet Take 1 tablet by mouth every 6 (six) hours as needed for severe pain.   albuterol (VENTOLIN HFA) 108 (90 Base) MCG/ACT inhaler Inhale 1-2 puffs into the lungs every 6 (six) hours as needed for wheezing or shortness of breath.    ALPRAZolam (XANAX) 0.5 MG tablet Take 0.5 mg by mouth  2 (two) times daily as needed for anxiety.   amoxicillin (AMOXIL) 500 MG tablet Take 4 tablets (2,000 mg total) by mouth as directed. 1 hour prior to dental work including cleanings   aspirin 81 MG tablet Take 1 tablet (81 mg total) by mouth daily.   carvedilol (COREG) 6.25 MG tablet Take 1 tablet (6.25 mg total) by mouth 2 (two) times daily.   clopidogrel (PLAVIX) 75 MG tablet Take 1 tablet (75 mg total) by mouth daily.   cycloSPORINE (RESTASIS) 0.05 % ophthalmic emulsion Place 1 drop into both eyes 2 (two) times daily as needed (dry eyes).   diclofenac Sodium (VOLTAREN) 1 % GEL Apply 1 application. topically 4 (four) times daily as needed (pain).   ezetimibe (ZETIA) 10 MG tablet Take 10 mg by mouth daily.   fluconazole (DIFLUCAN) 150 MG tablet Take 1 tablet by mouth daily as needed (For flair up).   HUMALOG KWIKPEN 100 UNIT/ML KwikPen Inject 12 Units into the skin 2 (two) times daily with a meal.   JARDIANCE 25 MG TABS tablet Take 25 mg by mouth  daily.   LANTUS SOLOSTAR 100 UNIT/ML Solostar Pen Inject 38 Units into the skin at bedtime.   levETIRAcetam (KEPPRA XR) 750 MG 24 hr tablet Take 2 tablets (1,500 mg total) by mouth at bedtime.   lidocaine (LIDODERM) 5 % Place 1 patch onto the skin daily.   losartan (COZAAR) 50 MG tablet Take 50 mg by mouth daily.   Multiple Vitamins-Minerals (MULTIVITAMINS THER. W/MINERALS) TABS Take 1 tablet by mouth daily.     mupirocin ointment (BACTROBAN) 2 % Apply 1 Application topically 3 (three) times daily.   nitroGLYCERIN (NITROSTAT) 0.4 MG SL tablet Place 0.4 mg under the tongue every 5 (five) minutes as needed for chest pain.   pregabalin (LYRICA) 50 MG capsule Take 1 capsule every night   rosuvastatin (CRESTOR) 40 MG tablet Take 40 mg by mouth daily.   sertraline (ZOLOFT) 100 MG tablet Take 1 tablet (100 mg total) by mouth 2 (two) times daily.   torsemide (DEMADEX) 20 MG tablet Take 20 mg by mouth daily. TAKE EXTRA TABLET AS NEEDED     Allergies:   Metformin hcl, Ace inhibitors, Actos [pioglitazone], Atorvastatin, Candesartan cilexetil, Gabapentin, Influenza vaccines, Isosorbide nitrate, Keflex [cephalexin], Quinapril hcl, Tramadol hcl, and Tape   Social History   Socioeconomic History   Marital status: Married    Spouse name: Not on file   Number of children: 2   Years of education: 14   Highest education level: Associate degree: occupational, Hotel manager, or vocational program  Occupational History   Occupation: Disability  Tobacco Use   Smoking status: Never   Smokeless tobacco: Never  Vaping Use   Vaping Use: Never used  Substance and Sexual Activity   Alcohol use: No   Drug use: No   Sexual activity: Not Currently  Other Topics Concern   Not on file  Social History Narrative   Right handed    Lives in a one story home   Drinks caffeine    Social Determinants of Health   Financial Resource Strain: Not on file  Food Insecurity: No Food Insecurity (12/31/2021)   Hunger Vital  Sign    Worried About Running Out of Food in the Last Year: Never true    Ran Out of Food in the Last Year: Never true  Transportation Needs: No Transportation Needs (12/31/2021)   PRAPARE - Hydrologist (Medical): No  Lack of Transportation (Non-Medical): No  Physical Activity: Not on file  Stress: Not on file  Social Connections: Not on file     Family History: The patient's family history includes Alzheimer's disease in her maternal grandfather; Cancer in her mother; Diabetes in her father; Heart disease in her father; Hyperlipidemia in her father; Hypertension in her father.  ROS:   Please see the history of present illness.    All other systems reviewed and are negative.  EKGs/Labs/Other Studies Reviewed:    The following studies were reviewed today:  TAVR OPERATIVE NOTE     Date of Procedure:                12/31/2021   Preoperative Diagnosis:      Severe Aortic Stenosis    Postoperative Diagnosis:    Same    Procedure:        Transcatheter Aortic Valve Replacement - Percutaneous  Transfemoral Approach             Edwards Sapien 3 Ultra Resilia THV (size 23 mm, serial # [id number])              Co-Surgeons:                        Gaye Pollack, MD and Sherren Mocha, MD   Anesthesiologist:                  Adele Barthel, MD   Echocardiographer:              Jenkins Rouge, MD   Pre-operative Echo Findings: Severe aortic stenosis Mild left ventricular systolic dysfunction LVEF 45%   Post-operative Echo Findings: No paravalvular leak Unchanged left ventricular systolic function   _____________   Echo 01/01/22:  IMPRESSIONS   1. Left ventricular ejection fraction, by estimation, is 35 to 40%. The  left ventricle has moderately decreased function. The left ventricle  demonstrates regional wall motion abnormalities with severe hypokinesis of  the septum. Left ventricular  diastolic parameters are consistent with Grade II  diastolic dysfunction  (pseudonormalization).   2. Right ventricular systolic function was not well visualized. The right  ventricular size is not well visualized. There is moderately elevated  pulmonary artery systolic pressure. The estimated right ventricular  systolic pressure is 21.3 mmHg.   3. Left atrial size was mildly dilated.   4. The mitral valve is degenerative. Mild mitral valve regurgitation.  Moderate mitral stenosis. The mean mitral valve gradient is 9.0 mmHg, MVA  1.48 cm^2 by VTI. Moderate mitral annular calcification.   5. Bioprosthetic aortic valve s/p TAVR witih 23 mm Edwards Sapien THV.  Mean gradient 7 mmHg, EOA 1.86 cm^2, DI 0.66. No significant perivalvular  leakage noted.   6. The inferior vena cava is dilated in size with <50% respiratory  variability, suggesting right atrial pressure of 15 mmHg.   7. Technically difficult study with poor acoustic windows. All wall  segments of the LV were not visualized and the RV was poorly visualized  EKG:  EKG is ordered today.  The ekg ordered today demonstrates sinus with LBBB, HR 99  Recent Labs: 04/21/2021: B Natriuretic Peptide 583.7 12/27/2021: ALT 19 01/01/2022: Magnesium 2.1 01/02/2022: BUN 25; Creatinine, Ser 1.83; Hemoglobin 8.5; Platelets 173; Potassium 4.7; Sodium 136  Recent Lipid Panel    Component Value Date/Time   CHOL 113 08/30/2013 0550   TRIG 149 08/30/2013 0550   HDL 36 (L) 08/30/2013 0550  CHOLHDL 3.1 08/30/2013 0550   VLDL 30 08/30/2013 0550   LDLCALC 47 08/30/2013 0550     Risk Assessment/Calculations:       Physical Exam:    VS:  BP 108/62   Pulse 99   Ht _0  (1.651 m)   Wt 176 lb 6.4 oz (80 kg)   SpO2 96%   BMI 29.35 kg/m     Wt Readings from Last 3 Encounters:  01/08/22 176 lb 6.4 oz (80 kg)  01/07/22 167 lb (75.8 kg)  01/02/22 180 lb 1.9 oz (81.7 kg)     GEN:  Well nourished, well developed in no acute distress HEENT: Normal NECK: No JVD LYMPHATICS: No  lymphadenopathy CARDIAC: RRR, no murmurs, rubs, gallops RESPIRATORY:  Clear to auscultation without rales, wheezing or rhonchi  ABDOMEN: Soft, non-tender, non-distended MUSCULOSKELETAL:  No edema; No deformity  SKIN: Warm and dry  Groin sites clear without hematoma or ecchymosis. S/p BKAs bilaterally  NEUROLOGIC:  Alert and oriented x 3 PSYCHIATRIC:  Normal affect   ASSESSMENT:    1. S/P TAVR (transcatheter aortic valve replacement)   2. Essential hypertension   3. Anemia, unspecified type   4. Stage 3b chronic kidney disease (HCC)   5. HFrEF (heart failure with reduced ejection fraction) ([place])   6. PVD (peripheral vascular disease) ([place])   7. Coronary artery disease involving native heart without angina pectoris, unspecified vessel or lesion type    PLAN:    In order of problems listed above:  Severe AS s/p TAVR: doing well but still feels fatigued. Groin sites are healing well. ECG with no HAVB. SBE prophylaxis discussed; I have RX'd amoxicillin.(Allergy to Keflex but just GI upset). Continue on chronic Asprin and Plavix. I will see her back for 1 month follow up and echo   HTN: Bp well controlled today. No changes made   Chronic anemia: require a transfusion during admission. Check CBC today    CKD stage IIIb: creat close to baseline ~1.8-1.9. check BMET today   Chronic combined S/D CHF: appears euvolemic. Continue Jardiance, Losartan and torsemide.    PAD s/p bilateral BKA: continue medical therapy with aspirin and statin.    CAD s/p CABG: pre TAVR cath showed showed multivessel CAD with total occlusion of the LAD, left circumflex, and right coronary arteries.  She had continued patency of the LIMA to LAD and free left radial graft to obtuse marginal.  Her vein graft to the right coronary artery and diagonal were occluded and there was extensive collateralization of the RCA branches from left to right collaterals. Continue medical therapy.    Medication Adjustments/Labs and  Tests Ordered: Current medicines are reviewed at length with the patient today.  Concerns regarding medicines are outlined above.  Orders Placed This Encounter  Procedures   CBC   Basic metabolic panel   EKG 20-URKY   Meds ordered this encounter  Medications   amoxicillin (AMOXIL) 500 MG tablet    Sig: Take 4 tablets (2,000 mg total) by mouth as directed. 1 hour prior to dental work including cleanings    Dispense:  12 tablet    Refill:  12    Order Specific Question:   Supervising Provider    Answer:   [place], [place]    Patient Instructions  Medication Instructions:  Start Amoxicillin 500 mg, take 4 tablets by mouth 1 hour before dental procedures and cleanings     *If you need a refill on your cardiac medications before your next  appointment, please call your pharmacy*   Lab Work: Cbc, Bmp - today   If you have labs (blood work) drawn today and your tests are completely normal, you will receive your results only by: [place] (if you have MyChart) OR A paper copy in the mail If you have any lab test that is abnormal or we need to change your treatment, we will call you to review the results.   Testing/Procedures: None ordered    Follow-Up: Follow up as scheduled   Other Instructions   Important Information About Sugar         Signed, Angelena Form, PA-C  01/08/2022 2:01 PM    [place]

## 2022-01-07 NOTE — Progress Notes (Signed)
NEUROLOGY FOLLOW UP OFFICE NOTE  [name] [id number] [birth year]  HISTORY OF PRESENT ILLNESS: I had the pleasure of seeing [name] in follow-up in the neurology clinic on 01/07/2022.  The patient was last seen 2 years ago for seizures and phantom limb pain. She is again accompanied by her son [name] who helps supplement the history today.  Records and images were personally reviewed where available.  Since her last visit, she reports she is not doing quite well. She has been in and out of the hospital, most recently she had aortic valve replacement and still feels tired. They deny any seizures since 2015 on Levetiracetam ER '1500mg'$  qhs, no side effects. No staring/unresponsive episodes. She has phantom limb pain from bilateral BKAs, she only takes the Lyrica '50mg'$  as needed around once a week, it is a "miracle drug," the pain just goes right away. She is concerned about lip lesions, her mouth was very dry while in the hospital, and now there are lesions on the upper lip and right lower lip.   HPI:  This is a 66 yo RH woman with a history of diabetes, hypertension, hyperlipidemia, peripheral vascular disease s/p right carotid endarterectomy, CAD s/p CABG, and right BKA secondary to diabetes. She was admitted for left foot osteomyelitis due to poor healing wound after a fall last winter. She underwent left transtibial amputation on 08/22/13. She was discharged home on 06/11, then started having altered mental status on 06/15. Her son reports she had hit her head in the left frontal region when getting into their car, then started acting drowsy and out of it the next day. That evening, they were getting ready to eat when her right arm flexed and clenched into her chest followed by head turn to the left,stiffening and shaking for 1 minute as she fell to her left side. She was confused for 10 minutes, back to baseline within 30 minutes. She was brought to [facility] ER where BP was noted to be 205/87. She  was given IV labetalol with slow improvement in BP. Blood sugar noted to be 303. Head CT showed low attenuation in the left corona radiata and centrum semiovale. She had an MRI brain initially without contrast which showed increased T2 and FLAIR signal in the left hemisphere from the anterior frontal cortex, supplemental motor area, extending down to the posterior limb of the internal capsule. She became agitated and needed sedation for MRI brain with contrast, however again became restless with motion degradation, no abnormal enhancement seen. Left ICA flow void was noted to be absent, similar since 2009. It was felt that symptoms were not consistent with left MCA ischemia and concern was for viral CNS infection and received 4 days of empiric IV Acyclovir. She underwent a lumbar puncture which showed WBC 1, RBC 17, glucose 82, protein 29, VDRL, CMV, HSV, arbovirus, gram stain and culture negative. No malignant cells on cytology. She was started on Keppra with no further seizures, discharged home on 06/18. She was apparently intermittently confused at home, woke up better then next day, then had another seizure that morning. Her son today reports she had a generalized convulsion with head deviation to left for 5 minutes. On arrival to the ER, per records, she was poorly responsive with right gaze deviation, nystagmoid eye movements. BP 197/80 She was given IV Ativan, according to her son she remained in this state for an hour, then was unresponsive for 36 hours after. Prolonged EEG showed diffuse slowing, no electrographic  seizures or epileptiform discharges. Repeat MRI brain with and without contrast was again degraded by motion. Similar abnormal signal in the left frontal and parietal lobes again noted, predominantly in the centrum semiovale extending into the subcortical white matter. There is an area of questionable thickening in the left frontal cortex noted to be unchanged, no effacement of sulci in this area,  no restricted diffusion, no abnormal enhancement although study is suboptimal. Chronically occluded left ICA again noted. She was discharged home on Keppra XR '1500mg'$ /day. Repeat MRI 2 months later showed complete resolution of abnormal signal in the left frontal and parietal regions. There was mild chronic small vessel disease bilaterally. Per report, findings could have been due to cerebritis or conceivably due to venous thrombosis without infarction.   Of note, in 2009, she had a nocturnal convulsion and was admitted to [facility]. Records unavailable for review, according to her son she was not started on seizure medication due to first seizure. She had an MRI brain at that time which I personally reviewed, it was noted to be limited by motion and patient refusing to complete study, however on FLAIR sequences, white matter changes in the left hemisphere are not present.   Epilepsy Risk Factors: Left frontal white matter lesion that has resolved. Otherwise she had a normal birth and early development. There is no history of febrile convulsions, significant traumatic brain injury, neurosurgical procedures, or family history of seizures.   Diagnostic Data: Neuropsychological testing done in June 2021 was largely within expected normative ranges. There were relative weaknesses across certain aspects of executive functioning, as well as some performance variability across several domains, but scores within normal limits. Etiology of cognitive changes possibly multifactorial, but overall no indication of a neurodegenerative illness  PAST MEDICAL HISTORY: Past Medical History:  Diagnosis Date   Anemia, normocytic normochromic 03/14/2008   Arthritis    "hands"   CAD (coronary artery disease) of artery bypass graft 03/14/2008   Chronic kidney disease (CKD) 12/26/2015   stage 3 due to type 2 diabetes mellitus   Chronic pain syndrome 09/25/2015   Coronary artery disease involving native coronary artery without  angina pectoris 09/25/2015   Essential hypertension 03/14/2008   Gastroesophageal reflux disease with esophagitis 07/09/2016   Generalized anxiety disorder with panic attacks    History of one panic attack   GERD (gastroesophageal reflux disease)    takes Zantac   Hyperlipidemia, unspecified 03/14/2008   Localization-related symptomatic epilepsy and epileptic syndromes with complex partial seizures, not intractable, without status epilepticus 06/27/2014   Major depressive disorder 03/14/2008   Neuropathy    Osteomyelitis of ankle or foot, left, acute 06/17/2013   Peripheral vascular disease    PVD (peripheral vascular disease) 03/25/2012   S/P TAVR (transcatheter aortic valve replacement) 12/31/2021   s/p TAVR with a 23 mm Edwards S3UR via the TF approach by Drs Burt Knack & Bartle   Type 2 diabetes mellitus with diabetic mononeuropathy, with long-term current use of insulin 03/14/2008    MEDICATIONS: Current Outpatient Medications on File Prior to Visit  Medication Sig Dispense Refill   acetaminophen-codeine (TYLENOL #3) 300-30 MG tablet Take 1 tablet by mouth every 6 (six) hours as needed for severe pain.     albuterol (VENTOLIN HFA) 108 (90 Base) MCG/ACT inhaler Inhale 1-2 puffs into the lungs every 6 (six) hours as needed for wheezing or shortness of breath.      ALPRAZolam (XANAX) 0.5 MG tablet Take 0.5 mg by mouth 2 (two) times daily as needed  for anxiety.     aspirin 81 MG tablet Take 1 tablet (81 mg total) by mouth daily. 90 tablet 0   carvedilol (COREG) 6.25 MG tablet Take 1 tablet (6.25 mg total) by mouth 2 (two) times daily. 180 tablet 3   clopidogrel (PLAVIX) 75 MG tablet Take 1 tablet (75 mg total) by mouth daily. 90 tablet 0   cycloSPORINE (RESTASIS) 0.05 % ophthalmic emulsion Place 1 drop into both eyes 2 (two) times daily as needed (dry eyes).     diclofenac Sodium (VOLTAREN) 1 % GEL Apply 1 application. topically 4 (four) times daily as needed (pain).     ezetimibe (ZETIA)  10 MG tablet Take 10 mg by mouth daily.     HUMALOG KWIKPEN 100 UNIT/ML KwikPen Inject 12 Units into the skin 2 (two) times daily with a meal.     JARDIANCE 25 MG TABS tablet Take 25 mg by mouth daily.     LANTUS SOLOSTAR 100 UNIT/ML Solostar Pen Inject 38 Units into the skin at bedtime.     levETIRAcetam (KEPPRA XR) 750 MG 24 hr tablet TAKE 2 TABLETS (1,500 MG TOTAL) BY MOUTH AT BEDTIME. 60 tablet 0   lidocaine (LIDODERM) 5 % Place 1 patch onto the skin daily.     losartan (COZAAR) 50 MG tablet Take 50 mg by mouth daily.     Multiple Vitamins-Minerals (MULTIVITAMINS THER. W/MINERALS) TABS Take 1 tablet by mouth daily.       mupirocin ointment (BACTROBAN) 2 % Apply 1 Application topically 3 (three) times daily.     nitroGLYCERIN (NITROSTAT) 0.4 MG SL tablet Place 0.4 mg under the tongue every 5 (five) minutes as needed for chest pain.     pregabalin (LYRICA) 50 MG capsule TAKE 1 CAPSULE BY MOUTH EVERY NIGHT (Patient taking differently: Take 50 mg by mouth daily as needed (For pain).) 90 capsule 2   rosuvastatin (CRESTOR) 40 MG tablet Take 40 mg by mouth daily.     sertraline (ZOLOFT) 100 MG tablet Take 1 tablet (100 mg total) by mouth 2 (two) times daily. 180 tablet 0   torsemide (DEMADEX) 20 MG tablet Take 20 mg by mouth daily. TAKE EXTRA TABLET AS NEEDED     fluconazole (DIFLUCAN) 150 MG tablet Take 1 tablet by mouth daily as needed (For flair up). (Patient not taking: Reported on 01/07/2022)     No current facility-administered medications on file prior to visit.    ALLERGIES: Allergies  Allergen Reactions   Metformin Hcl Nausea And Vomiting   Ace Inhibitors Cough   Actos [Pioglitazone] Swelling   Atorvastatin Other (See Comments)    myalgias   Candesartan Cilexetil Other (See Comments)    Rash "cant recall"    Gabapentin Other (See Comments)    Dizziness and unbalanced   Influenza Vaccines     nearly killed me   Isosorbide Nitrate Other (See Comments)    HA.   Keflex  [Cephalexin]      stomach upset   Quinapril Hcl Cough   Tramadol Hcl Other (See Comments)    seziure   Tape Rash    Redness, rash, itchiness     FAMILY HISTORY: Family History  Problem Relation Age of Onset   Cancer Mother    Heart disease Father    Diabetes Father    Hyperlipidemia Father    Hypertension Father    Alzheimer's disease Maternal Grandfather     SOCIAL HISTORY: Social History   Socioeconomic History   Marital status: Married  Spouse name: Not on file   Number of children: 2   Years of education: 14   Highest education level: Associate degree: occupational, Hotel manager, or vocational program  Occupational History   Occupation: Disability  Tobacco Use   Smoking status: Never   Smokeless tobacco: Never  Vaping Use   Vaping Use: Never used  Substance and Sexual Activity   Alcohol use: No   Drug use: No   Sexual activity: Not Currently  Other Topics Concern   Not on file  Social History Narrative   Right handed    Lives in a one story home   Drinks caffeine    Social Determinants of Health   Financial Resource Strain: Not on file  Food Insecurity: No Food Insecurity (12/31/2021)   Hunger Vital Sign    Worried About Running Out of Food in the Last Year: Never true    Ran Out of Food in the Last Year: Never true  Transportation Needs: No Transportation Needs (12/31/2021)   PRAPARE - Hydrologist (Medical): No    Lack of Transportation (Non-Medical): No  Physical Activity: Not on file  Stress: Not on file  Social Connections: Not on file  Intimate Partner Violence: Not At Risk (12/31/2021)   Humiliation, Afraid, Rape, and Kick questionnaire    Fear of Current or Ex-Partner: No    Emotionally Abused: No    Physically Abused: No    Sexually Abused: No     PHYSICAL EXAM: Vitals:   01/07/22 1420  BP: 135/73  Pulse: 96  SpO2: 99%   General: No acute distress, sitting on wheelchair Head:   Normocephalic/atraumatic, lesions on upper lip and right lower lip Skin/Extremities: No rash, no edema Neurological Exam: alert and awake. No aphasia or dysarthria. Fund of knowledge is appropriate.  Attention and concentration are normal.   Cranial nerves: Pupils equal, round. Extraocular movements intact with no nystagmus. Visual fields full.  No facial asymmetry.  Motor: Bulk and tone normal, muscle strength 5/5 on both UE, 5/5 bilateral hip flexion. S/p bilateral BKA. Finger to nose testing intact.  Gait not tested   IMPRESSION: This is a 66 yo RH woman with a history of diabetes, hypertension, hyperlipidemia, peripheral vascular disease s/p right carotid endarterectomy, CAD s/p CABG, s/p bilateral BKA, nocturnal convulsion in 2009. She underwent left transtibial amputation on 08/22/13 then had a seizure on 08/25/13. MRI brain showed a lesion in the left frontal and parietal white matter, concerning for vasogenic edema. LP unremarkable, including cytology. Repeat MRI brain 2 months later showed complete resolution of abnormal signal. Considerations include cerebritis, venous thrombosis without infarction. She was noted to have hypertensive emergency with the first seizure, although asymmetric and more frontal, atypical PRES is another consideration, particularly with complete resolution on MRI. She continues to be seizure-free since June 2015 on Levetiracetam ER '1500mg'$  qhs, refills sent. She is on Lyrica '50mg'$  qhs for phantom limb pain, she takes it as needed with excellent response. She does not drive. Follow-up in 1 year, call for any changes.    Thank you for allowing me to participate in her care.  Please do not hesitate to call for any questions or concerns.    Ellouise Newer, M.D.   CC: Dr. Harrington Challenger

## 2022-01-08 ENCOUNTER — Ambulatory Visit (INDEPENDENT_AMBULATORY_CARE_PROVIDER_SITE_OTHER): Payer: Medicare Other | Admitting: Physician Assistant

## 2022-01-08 VITALS — BP 108/62 | HR 99 | Ht 65.0 in | Wt 176.4 lb

## 2022-01-08 DIAGNOSIS — D649 Anemia, unspecified: Secondary | ICD-10-CM | POA: Diagnosis not present

## 2022-01-08 DIAGNOSIS — I5020 Unspecified systolic (congestive) heart failure: Secondary | ICD-10-CM | POA: Diagnosis not present

## 2022-01-08 DIAGNOSIS — Z952 Presence of prosthetic heart valve: Secondary | ICD-10-CM

## 2022-01-08 DIAGNOSIS — I739 Peripheral vascular disease, unspecified: Secondary | ICD-10-CM | POA: Diagnosis not present

## 2022-01-08 DIAGNOSIS — I10 Essential (primary) hypertension: Secondary | ICD-10-CM | POA: Diagnosis not present

## 2022-01-08 DIAGNOSIS — N1832 Chronic kidney disease, stage 3b: Secondary | ICD-10-CM

## 2022-01-08 DIAGNOSIS — I2510 Atherosclerotic heart disease of native coronary artery without angina pectoris: Secondary | ICD-10-CM | POA: Diagnosis not present

## 2022-01-08 MED ORDER — AMOXICILLIN 500 MG PO TABS: 2000.0000 mg | ORAL_TABLET | ORAL | 12 refills | Status: DC

## 2022-01-08 NOTE — Patient Instructions (Addendum)
Medication Instructions:  Start Amoxicillin 500 mg, take 4 tablets by mouth 1 hour before dental procedures and cleanings    *If you need a refill on your cardiac medications before your next appointment, please call your pharmacy*   Lab Work: Cbc, Bmp- today   If you have labs (blood work) drawn today and your tests are completely normal, you will receive your results only by: MyChart Message (if you have MyChart) OR A paper copy in the mail If you have any lab test that is abnormal or we need to change your treatment, we will call you to review the results.   Testing/Procedures: None ordered    Follow-Up: Follow up as scheduled    Other Instructions   Important Information About Sugar       

## 2022-01-09 LAB — BASIC METABOLIC PANEL WITH GFR
BUN/Creatinine Ratio: 13 (ref 12–28)
BUN: 25 mg/dL (ref 8–27)
CO2: 27 mmol/L (ref 20–29)
Calcium: 8.5 mg/dL — ABNORMAL LOW (ref 8.7–10.3)
Chloride: 95 mmol/L — ABNORMAL LOW (ref 96–106)
Creatinine, Ser: 1.89 mg/dL — ABNORMAL HIGH (ref 0.57–1.00)
Glucose: 442 mg/dL — ABNORMAL HIGH (ref 70–99)
Potassium: 4.6 mmol/L (ref 3.5–5.2)
Sodium: 135 mmol/L (ref 134–144)
eGFR: 29 mL/min/1.73 — ABNORMAL LOW (ref >59)

## 2022-01-09 LAB — CBC
Hematocrit: 26.3 % — ABNORMAL LOW (ref 34.0–46.6)
Hemoglobin: 7.7 g/dL — ABNORMAL LOW (ref 11.1–15.9)
MCH: 24.6 pg — ABNORMAL LOW (ref 26.6–33.0)
MCHC: 29.3 g/dL — ABNORMAL LOW (ref 31.5–35.7)
MCV: 84 fL (ref 79–97)
Platelets: 178 10*3/uL (ref 150–450)
RBC: 3.13 x10E6/uL — ABNORMAL LOW (ref 3.77–5.28)
RDW: 17.7 % — ABNORMAL HIGH (ref 11.7–15.4)
WBC: 6 10*3/uL (ref 3.4–10.8)

## 2022-01-13 ENCOUNTER — Telehealth: Payer: Self-pay | Admitting: Physician Assistant

## 2022-01-13 DIAGNOSIS — D649 Anemia, unspecified: Secondary | ICD-10-CM

## 2022-01-13 NOTE — Telephone Encounter (Signed)
Returned call to patient and discussed lab results.  Per Angelena Form, PA-C: Kidney function at baseline and blood counts starting to trend down. Still within previous baseline. If she does not follow with hematology, lets place a referral. She may need iron infusions or further work up of anemia.   Referral to hematology placed.  Patient verbalized understanding and expressed appreciation for call.

## 2022-01-13 NOTE — Telephone Encounter (Signed)
Patient returned call for her lab results. °

## 2022-01-22 ENCOUNTER — Encounter: Payer: Self-pay | Admitting: Oncology

## 2022-01-22 ENCOUNTER — Inpatient Hospital Stay: Payer: Medicare Other | Attending: Oncology

## 2022-01-22 ENCOUNTER — Inpatient Hospital Stay: Payer: Medicare Other | Admitting: Oncology

## 2022-01-22 VITALS — BP 133/59 | HR 86 | Temp 99.4°F | Resp 16 | Ht 65.0 in | Wt 176.7 lb

## 2022-01-22 DIAGNOSIS — Z992 Dependence on renal dialysis: Secondary | ICD-10-CM | POA: Diagnosis not present

## 2022-01-22 DIAGNOSIS — D500 Iron deficiency anemia secondary to blood loss (chronic): Secondary | ICD-10-CM | POA: Diagnosis not present

## 2022-01-22 DIAGNOSIS — E1122 Type 2 diabetes mellitus with diabetic chronic kidney disease: Secondary | ICD-10-CM | POA: Insufficient documentation

## 2022-01-22 DIAGNOSIS — I130 Hypertensive heart and chronic kidney disease with heart failure and stage 1 through stage 4 chronic kidney disease, or unspecified chronic kidney disease: Secondary | ICD-10-CM | POA: Insufficient documentation

## 2022-01-22 DIAGNOSIS — N1832 Chronic kidney disease, stage 3b: Secondary | ICD-10-CM

## 2022-01-22 DIAGNOSIS — N183 Chronic kidney disease, stage 3 unspecified: Secondary | ICD-10-CM

## 2022-01-22 DIAGNOSIS — I25810 Atherosclerosis of coronary artery bypass graft(s) without angina pectoris: Secondary | ICD-10-CM

## 2022-01-22 DIAGNOSIS — I739 Peripheral vascular disease, unspecified: Secondary | ICD-10-CM | POA: Diagnosis not present

## 2022-01-22 DIAGNOSIS — Z952 Presence of prosthetic heart valve: Secondary | ICD-10-CM

## 2022-01-22 DIAGNOSIS — I509 Heart failure, unspecified: Secondary | ICD-10-CM | POA: Diagnosis not present

## 2022-01-22 DIAGNOSIS — D631 Anemia in chronic kidney disease: Secondary | ICD-10-CM | POA: Insufficient documentation

## 2022-01-22 DIAGNOSIS — I5020 Unspecified systolic (congestive) heart failure: Secondary | ICD-10-CM

## 2022-01-22 DIAGNOSIS — Z951 Presence of aortocoronary bypass graft: Secondary | ICD-10-CM

## 2022-01-22 DIAGNOSIS — D539 Nutritional anemia, unspecified: Secondary | ICD-10-CM

## 2022-01-22 NOTE — Progress Notes (Unsigned)
[facility] Initial Visit:  Patient Care Team: [name], MD as PCP - General (Family Medicine) Minus Breeding, MD as PCP - Cardiology (Cardiology) Cameron Sprang, MD as Consulting Physician (Neurology)  CHIEF COMPLAINTS/PURPOSE OF CONSULTATION:  Oncology History   No history exists.    HISTORY OF PRESENTING ILLNESS: [name] 66 y.o. female is here because of  anemia Medical history notable for coronary artery disease, stage III chronic kidney disease, diabetes mellitus type 2, GERD with esophagitis, hyperlipidemia, peripheral vascular disease, TAVR placement, hypertension CHF (EF 35 to 40% post), CAD with CABG x4  April 21 2021:  Admitted to [facility]  for syncopal episode and hematochezia. In the ED she was noted to be and hypoxic requiring 2L O2, hemoglobin 8.4, and troponin 876. Hemoglobin decreased to 7.5 and required transfusion. She had maroon-colored stools.  Colonoscopy revealed  sigmoid diverticulosis, five subcentimeter colon polyps, and erythematous mucosa in sigmoid colon with biopsies showing patchy mild active colitis negative for features of chronicity    December 27, 2021 WBC 8.7 hemoglobin 8.3 MCV 83 platelet count 234 Hemoglobin A1c 9.6 December 31, 2021 BMP notable for glucose 221 creatinine 1.7  January 02 2022:  WBC 10.7 Hgb 8.5 PLT 173  January 22 2022:  [facility] Consult  Patient has been taking oral iron for about 20 yrs with GI intolerance of dyspepsia.  Has received PRBC's several times this year.  Has never received IV iron Fatigued and at times feels real cold.   Sometimes has ice pica.   Having dark stools tinged with blood.   Has not had an EGD. Patient is on ASA and Plavix because of PVD and CAD.    Social:  Married.  Former Marine scientist.  Tobacco none.  EtOH none  [place]:  No family members with bleeding disorders  Review of Systems  Constitutional:  Positive for fatigue.  Negative for appetite change, chills, fever and unexpected weight change.  HENT:   Positive for sore throat. Negative for mouth sores, nosebleeds and trouble swallowing.   Eyes:  Negative for eye problems and icterus.       Vision changes:  None  Respiratory:  Negative for chest tightness, cough, hemoptysis, shortness of breath and wheezing.        SOB resolved following heart surgery  Cardiovascular:  Positive for chest pain. Negative for palpitations.       Had a bout of chest pain today which lasted a few seconds  Gastrointestinal:  Positive for blood in stool and constipation. Negative for abdominal pain, diarrhea, nausea and vomiting.  Endocrine:       Cold intolerance:  none Heat intolerance:  none  Genitourinary:  Negative for bladder incontinence, difficulty urinating, dysuria, frequency, hematuria and nocturia.   Musculoskeletal:  Positive for arthralgias, gait problem and myalgias. Negative for back pain.       Generalized arthralgias.  Has pain especially phantom pain  Skin:  Negative for itching, rash and wound.  Neurological:  Positive for extremity weakness, gait problem and headaches. Negative for dizziness, light-headedness and numbness.  Hematological:  Negative for adenopathy.       No bruising.  See HPI  Psychiatric/Behavioral:  Negative for suicidal ideas. [name] not nervous/anxious.     MEDICAL HISTORY: Past Medical History:  Diagnosis Date  . Anemia, normocytic normochromic 03/14/2008  . Arthritis    "hands"  . CAD (coronary artery disease) of artery bypass graft 03/14/2008  .  Chronic kidney disease (CKD) 12/26/2015   stage 3 due to type 2 diabetes mellitus  . Chronic pain syndrome 09/25/2015  . Coronary artery disease involving native coronary artery without angina pectoris 09/25/2015  . Essential hypertension 03/14/2008  . Gastroesophageal reflux disease with esophagitis 07/09/2016  . Generalized anxiety disorder with panic attacks    History of one  panic attack  . GERD (gastroesophageal reflux disease)    takes Zantac  . Hyperlipidemia, unspecified 03/14/2008  . Localization-related symptomatic epilepsy and epileptic syndromes with complex partial seizures, not intractable, without status epilepticus 06/27/2014  . Major depressive disorder 03/14/2008  . Neuropathy   . Osteomyelitis of ankle or foot, left, acute 06/17/2013  . Peripheral vascular disease   . PVD (peripheral vascular disease) 03/25/2012  . S/P TAVR (transcatheter aortic valve replacement) 12/31/2021   s/p TAVR with a 23 mm Edwards S3UR via the TF approach by Drs Burt Knack & Bartle  . Type 2 diabetes mellitus with diabetic mononeuropathy, with long-term current use of insulin 03/14/2008    SURGICAL HISTORY: Past Surgical History:  Procedure Laterality Date  . AMPUTATION Left 08/22/2013   Procedure: LEFT BELOW KNEE AMPUTATION;  Surgeon: Newt Minion, MD;  Location: [place];  Service: Orthopedics;  Laterality: Left;  . ANKLE FUSION Left 05/11/2013   TIBIOCALCANEAL FUSION   . ANKLE FUSION Left 05/12/2013   Procedure: LEFT TIBIOCALCANEAL FUSION;  Surgeon: Newt Minion, MD;  Location: [place];  Service: Orthopedics;  Laterality: Left;  Left Tibiocalcaneal Fusion  . APPLICATION OF WOUND VAC  06/17/2013   Procedure: APPLICATION OF WOUND VAC;  Surgeon: Newt Minion, MD;  Location: [place];  Service: Orthopedics;;  . BELOW KNEE LEG AMPUTATION Right Jan. 2008  . BUBBLE STUDY  07/31/2021   Procedure: BUBBLE STUDY;  Surgeon: Geralynn Rile, MD;  Location: [place];  Service: Cardiovascular;;  . CAROTID ENDARTERECTOMY Right   . CATARACT EXTRACTION Left 12/25/14  . CHOLECYSTECTOMY    . COLONOSCOPY    . COLONOSCOPY N/A 04/24/2021   Procedure: COLONOSCOPY;  Surgeon: Toledo, Benay Pike, MD;  Location: [facility];  Service: Gastroenterology;  Laterality: N/A;  . CORONARY ARTERY BYPASS GRAFT  12/01/2007   "CABG X4" (05/12/2013)  . DILATION AND CURETTAGE OF UTERUS    . I & D  EXTREMITY Left 06/17/2013   Procedure: IRRIGATION AND DEBRIDEMENT EXTREMITY, PLACEMENT ANTIBIOTIC STIMULAN BEADS;  Surgeon: Newt Minion, MD;  Location: [place];  Service: Orthopedics;  Laterality: Left;  . INTRAMEDULLARY (IM) NAIL INTERTROCHANTERIC Left 08/20/2018   Procedure: INTRAMEDULLARY (IM) NAIL INTERTROCHANTRIC;  Surgeon: Shona Needles, MD;  Location: [place];  Service: Orthopedics;  Laterality: Left;  . INTRAOPERATIVE TRANSTHORACIC ECHOCARDIOGRAM N/A 12/31/2021   Procedure: INTRAOPERATIVE TRANSTHORACIC ECHOCARDIOGRAM;  Surgeon: Sherren Mocha, MD;  Location: [place];  Service: Open Heart Surgery;  Laterality: N/A;  . IRRIGATION AND DEBRIDEMENT ABSCESS Left 06/17/2013   ANKLE           DR [name]  . RIGHT/LEFT HEART CATH AND CORONARY/GRAFT ANGIOGRAPHY N/A 10/07/2021   Procedure: RIGHT/LEFT HEART CATH AND CORONARY/GRAFT ANGIOGRAPHY;  Surgeon: Sherren Mocha, MD;  Location: [place] [facility];  Service: Cardiovascular;  Laterality: N/A;  . TEE WITHOUT CARDIOVERSION N/A 07/31/2021   Procedure: TRANSESOPHAGEAL ECHOCARDIOGRAM (TEE);  Surgeon: Geralynn Rile, MD;  Location: [place];  Service: Cardiovascular;  Laterality: N/A;  . TOE AMPUTATION Right    "took off a couple toes before the amputation"  . TONSILLECTOMY    . TRANSCATHETER AORTIC VALVE REPLACEMENT,  TRANSFEMORAL Right 12/31/2021   Procedure: Transcatheter Aortic Valve Replacement, Transfemoral;  Surgeon: Sherren Mocha, MD;  Location: [place];  Service: Open Heart Surgery;  Laterality: Right;    SOCIAL HISTORY: Social History   Socioeconomic History  . Marital status: Married    Spouse name: Not on file  . Number of children: 2  . Years of education: 51  . Highest education level: Associate degree: occupational, Hotel manager, or vocational program  Occupational History  . Occupation: Disability  Tobacco Use  . Smoking status: Never  . Smokeless tobacco: Never  Vaping Use  . Vaping Use: Never used  Substance and Sexual  Activity  . Alcohol use: No  . Drug use: No  . Sexual activity: Not Currently  Other Topics Concern  . Not on file  Social History Narrative   Right handed    Lives in a one story home   Drinks caffeine    Social Determinants of Health   Financial Resource Strain: Not on file  Food Insecurity: No Food Insecurity (12/31/2021)   Hunger Vital Sign   . Worried About Charity fundraiser in the Last Year: Never true   . Ran Out of Food in the Last Year: Never true  Transportation Needs: No Transportation Needs (12/31/2021)   PRAPARE - Transportation   . Lack of Transportation (Medical): No   . Lack of Transportation (Non-Medical): No  Physical Activity: Not on file  Stress: Not on file  Social Connections: Not on file  Intimate Partner Violence: Not At Risk (12/31/2021)   Humiliation, Afraid, Rape, and Kick questionnaire   . Fear of Current or Ex-Partner: No   . Emotionally Abused: No   . Physically Abused: No   . Sexually Abused: No    FAMILY HISTORY Family History  Problem Relation Age of Onset  . Cancer Mother   . Heart disease Father   . Diabetes Father   . Hyperlipidemia Father   . Hypertension Father   . Alzheimer's disease Maternal Grandfather     ALLERGIES:  is allergic to metformin hcl, ace inhibitors, actos [pioglitazone], atorvastatin, candesartan cilexetil, gabapentin, influenza vaccines, isosorbide nitrate, keflex [cephalexin], quinapril hcl, tramadol hcl, and tape.  MEDICATIONS:  Current Outpatient Medications  Medication Sig Dispense Refill  . acetaminophen-codeine (TYLENOL #3) 300-30 MG tablet Take 1 tablet by mouth every 6 (six) hours as needed for severe pain.    Marland Kitchen albuterol (VENTOLIN HFA) 108 (90 Base) MCG/ACT inhaler Inhale 1-2 puffs into the lungs every 6 (six) hours as needed for wheezing or shortness of breath.     . ALPRAZolam (XANAX) 0.5 MG tablet Take 0.5 mg by mouth 2 (two) times daily as needed for anxiety.    Marland Kitchen amoxicillin (AMOXIL) 500 MG  tablet Take 4 tablets (2,000 mg total) by mouth as directed. 1 hour prior to dental work including cleanings 12 tablet 12  . aspirin 81 MG tablet Take 1 tablet (81 mg total) by mouth daily. 90 tablet 0  . carvedilol (COREG) 6.25 MG tablet Take 1 tablet (6.25 mg total) by mouth 2 (two) times daily. 180 tablet 3  . clopidogrel (PLAVIX) 75 MG tablet Take 1 tablet (75 mg total) by mouth daily. 90 tablet 0  . cycloSPORINE (RESTASIS) 0.05 % ophthalmic emulsion Place 1 drop into both eyes 2 (two) times daily as needed (dry eyes).    Marland Kitchen diclofenac Sodium (VOLTAREN) 1 % GEL Apply 1 application. topically 4 (four) times daily as needed (pain).    Marland Kitchen ezetimibe (  ZETIA) 10 MG tablet Take 10 mg by mouth daily.    . fluconazole (DIFLUCAN) 150 MG tablet Take 1 tablet by mouth daily as needed (For flair up).    Ruthell Rummage KWIKPEN 100 UNIT/ML KwikPen Inject 12 Units into the skin 2 (two) times daily with a meal.    . JARDIANCE 25 MG TABS tablet Take 25 mg by mouth daily.    Marland Kitchen LANTUS SOLOSTAR 100 UNIT/ML Solostar Pen Inject 38 Units into the skin at bedtime.    . levETIRAcetam (KEPPRA XR) 750 MG 24 hr tablet Take 2 tablets (1,500 mg total) by mouth at bedtime. 180 tablet 3  . lidocaine (LIDODERM) 5 % Place 1 patch onto the skin daily.    Marland Kitchen losartan (COZAAR) 50 MG tablet Take 50 mg by mouth daily.    . Multiple Vitamins-Minerals (MULTIVITAMINS THER. W/MINERALS) TABS Take 1 tablet by mouth daily.      . mupirocin ointment (BACTROBAN) 2 % Apply 1 Application topically 3 (three) times daily.    . nitrofurantoin, macrocrystal-monohydrate, (MACROBID) 100 MG capsule Take 100 mg by mouth 2 (two) times daily.    . nitroGLYCERIN (NITROSTAT) 0.4 MG SL tablet Place 0.4 mg under the tongue every 5 (five) minutes as needed for chest pain.    . pregabalin (LYRICA) 50 MG capsule Take 1 capsule every night 90 capsule 2  . rosuvastatin (CRESTOR) 40 MG tablet Take 40 mg by mouth daily.    . sertraline (ZOLOFT) 100 MG tablet Take 1  tablet (100 mg total) by mouth 2 (two) times daily. 180 tablet 0  . torsemide (DEMADEX) 20 MG tablet Take 20 mg by mouth daily. TAKE EXTRA TABLET AS NEEDED     No current facility-administered medications for this visit.    PHYSICAL EXAMINATION:  ECOG PERFORMANCE STATUS: {CHL ONC ECOG PS:[id number]}   There were no vitals filed for this visit.  There were no vitals filed for this visit.   Physical Exam Vitals and nursing note reviewed.  Constitutional:      Appearance: Normal appearance. She is not toxic-appearing or diaphoretic.     Comments: Here with son.  Seated in wheel chair.    HENT:     Head: Normocephalic and atraumatic.     Right Ear: External ear normal.     Left Ear: External ear normal.     Nose: Nose normal. No congestion or rhinorrhea.  Eyes:     General: No scleral icterus.    Extraocular Movements: Extraocular movements intact.     Conjunctiva/sclera: Conjunctivae normal.     Pupils: Pupils are equal, round, and reactive to light.  Cardiovascular:     Rate and Rhythm: Normal rate.     Heart sounds: No murmur heard.    No friction rub. No gallop.  Abdominal:     General: Bowel sounds are normal.     Palpations: Abdomen is soft.  Musculoskeletal:        General: No swelling, tenderness or deformity.     Cervical back: Normal range of motion and neck supple. No rigidity or tenderness.  Lymphadenopathy:     Head:     Right side of head: No submental, submandibular, tonsillar, preauricular, posterior auricular or occipital adenopathy.     Left side of head: No submental, submandibular, tonsillar, preauricular, posterior auricular or occipital adenopathy.     Cervical: No cervical adenopathy.     Right cervical: No superficial, deep or posterior cervical adenopathy.    Left cervical: No superficial,  deep or posterior cervical adenopathy.     Upper Body:     Right upper body: No supraclavicular, axillary, pectoral or epitrochlear adenopathy.     Left upper  body: No supraclavicular, axillary, pectoral or epitrochlear adenopathy.  Skin:    General: Skin is warm.     Coloration: Skin is not jaundiced.  Neurological:     General: No focal deficit present.     Mental Status: She is alert and oriented to person, place, and time. Mental status is at baseline.     Cranial Nerves: No cranial nerve deficit.  Psychiatric:        Mood and Affect: Mood normal.        Behavior: Behavior normal.        Thought Content: Thought content normal.        Judgment: Judgment normal.    LABORATORY DATA: I have personally reviewed the data as listed:  Office Visit on 01/08/2022  Component Date Value Ref Range Status  . WBC 01/08/2022 6.0  3.4 - 10.8 x10E3/uL Final  . RBC 01/08/2022 3.13 (L)  3.77 - 5.28 x10E6/uL Final  . Hemoglobin 01/08/2022 7.7 (L)  11.1 - 15.9 g/dL Final  . Hematocrit 01/08/2022 26.3 (L)  34.0 - 46.6 % Final  . MCV 01/08/2022 84  79 - 97 fL Final  . MCH 01/08/2022 24.6 (L)  26.6 - 33.0 pg Final  . MCHC 01/08/2022 29.3 (L)  31.5 - 35.7 g/dL Final  . RDW 01/08/2022 17.7 (H)  11.7 - 15.4 % Final  . Platelets 01/08/2022 178  150 - 450 x10E3/uL Final  . Glucose 01/08/2022 442 (H)  70 - 99 mg/dL Final  . BUN 01/08/2022 25  8 - 27 mg/dL Final  . Creatinine, Ser 01/08/2022 1.89 (H)  0.57 - 1.00 mg/dL Final  . eGFR 01/08/2022 29 (L)  >59 mL/min/1.73 Final  . BUN/Creatinine Ratio 01/08/2022 13  12 - 28 Final  . Sodium 01/08/2022 135  134 - 144 mmol/L Final  . Potassium 01/08/2022 4.6  3.5 - 5.2 mmol/L Final  . Chloride 01/08/2022 95 (L)  96 - 106 mmol/L Final  . CO2 01/08/2022 27  20 - 29 mmol/L Final  . Calcium 01/08/2022 8.5 (L)  8.7 - 10.3 mg/dL Final  Admission on 12/31/2021, Discharged on 01/02/2022  Component Date Value Ref Range Status  . Glucose-Capillary 12/31/2021 227 (H)  70 - 99 mg/dL Final   Glucose reference range applies only to samples taken after fasting for at least 8 hours.  . Order Confirmation 12/31/2021    Final                    Value:ORDER PROCESSED BY BLOOD BANK DUPLICATE REQUEST Performed at [facility], [place] [address] [place], [place] [address]   . Order Confirmation 12/31/2021    Final                   Value:ORDER PROCESSED BY BLOOD BANK Performed at [facility], [place] [address], [place], [place] [address]   . pH, Arterial 12/31/2021 7.350  7.35 - 7.45 Final  . pCO2 arterial 12/31/2021 43.7  32 - 48 mmHg Final  . pO2, Arterial 12/31/2021 239 (H)  83 - 108 mmHg Final  . Bicarbonate 12/31/2021 24.1  20.0 - 28.0 mmol/L Final  . TCO2 12/31/2021 25  22 - 32 mmol/L Final  . O2 Saturation 12/31/2021 100  % Final  . Acid-base deficit 12/31/2021  1.0  0.0 - 2.0 mmol/L Final  . Sodium 12/31/2021 137  135 - 145 mmol/L Final  . Potassium 12/31/2021 4.2  3.5 - 5.1 mmol/L Final  . Calcium, Ion 12/31/2021 1.09 (L)  1.15 - 1.40 mmol/L Final  . HCT 12/31/2021 19.0 (L)  36.0 - 46.0 % Final  . Hemoglobin 12/31/2021 6.5 (LL)  12.0 - 15.0 g/dL Final  . Sample type 12/31/2021 ARTERIAL   Final  . Sodium 12/31/2021 137  135 - 145 mmol/L Final  . Potassium 12/31/2021 4.1  3.5 - 5.1 mmol/L Final  . Chloride 12/31/2021 103  98 - 111 mmol/L Final  . BUN 12/31/2021 29 (H)  8 - 23 mg/dL Final  . Creatinine, Ser 12/31/2021 1.70 (H)  0.44 - 1.00 mg/dL Final  . Glucose, Bld 12/31/2021 221 (H)  70 - 99 mg/dL Final   Glucose reference range applies only to samples taken after fasting for at least 8 hours.  . Calcium, Ion 12/31/2021 1.06 (L)  1.15 - 1.40 mmol/L Final  . TCO2 12/31/2021 23  22 - 32 mmol/L Final  . Hemoglobin 12/31/2021 7.1 (L)  12.0 - 15.0 g/dL Final  . HCT 12/31/2021 21.0 (L)  36.0 - 46.0 % Final  . pH, Arterial 12/31/2021 7.337 (L)  7.35 - 7.45 Final  . pCO2 arterial 12/31/2021 51.2 (H)  32 - 48 mmHg Final  . pO2, Arterial 12/31/2021 274 (H)  83 - 108 mmHg Final  . Bicarbonate 12/31/2021 27.4  20.0 - 28.0 mmol/L Final  . TCO2 12/31/2021 29  22 - 32 mmol/L Final  . O2 Saturation 12/31/2021  100  % Final  . Acid-Base Excess 12/31/2021 1.0  0.0 - 2.0 mmol/L Final  . Sodium 12/31/2021 136  135 - 145 mmol/L Final  . Potassium 12/31/2021 4.5  3.5 - 5.1 mmol/L Final  . Calcium, Ion 12/31/2021 1.13 (L)  1.15 - 1.40 mmol/L Final  . HCT 12/31/2021 20.0 (L)  36.0 - 46.0 % Final  . Hemoglobin 12/31/2021 6.8 (LL)  12.0 - 15.0 g/dL Final  . Sample type 12/31/2021 ARTERIAL   Final  . Sodium 12/31/2021 136  135 - 145 mmol/L Final  . Potassium 12/31/2021 4.6  3.5 - 5.1 mmol/L Final  . Chloride 12/31/2021 99  98 - 111 mmol/L Final  . BUN 12/31/2021 29 (H)  8 - 23 mg/dL Final  . Creatinine, Ser 12/31/2021 1.80 (H)  0.44 - 1.00 mg/dL Final  . Glucose, Bld 12/31/2021 216 (H)  70 - 99 mg/dL Final   Glucose reference range applies only to samples taken after fasting for at least 8 hours.  . Calcium, Ion 12/31/2021 1.15  1.15 - 1.40 mmol/L Final  . TCO2 12/31/2021 28  22 - 32 mmol/L Final  . Hemoglobin 12/31/2021 7.5 (L)  12.0 - 15.0 g/dL Final  . HCT 12/31/2021 22.0 (L)  36.0 - 46.0 % Final  . Sodium 12/31/2021 136  135 - 145 mmol/L Final  . Potassium 12/31/2021 4.8  3.5 - 5.1 mmol/L Final  . Chloride 12/31/2021 100  98 - 111 mmol/L Final  . BUN 12/31/2021 28 (H)  8 - 23 mg/dL Final  . Creatinine, Ser 12/31/2021 1.80 (H)  0.44 - 1.00 mg/dL Final  . Glucose, Bld 12/31/2021 223 (H)  70 - 99 mg/dL Final   Glucose reference range applies only to samples taken after fasting for at least 8 hours.  . Calcium, Ion 12/31/2021 1.09 (L)  1.15 - 1.40 mmol/L Final  . TCO2  12/31/2021 25  22 - 32 mmol/L Final  . Hemoglobin 12/31/2021 7.8 (L)  12.0 - 15.0 g/dL Final  . HCT 12/31/2021 23.0 (L)  36.0 - 46.0 % Final  . Sodium 12/31/2021 137  135 - 145 mmol/L Final  . Potassium 12/31/2021 4.5  3.5 - 5.1 mmol/L Final  . Chloride 12/31/2021 101  98 - 111 mmol/L Final  . BUN 12/31/2021 27 (H)  8 - 23 mg/dL Final  . Creatinine, Ser 12/31/2021 1.80 (H)  0.44 - 1.00 mg/dL Final  . Glucose, Bld 12/31/2021 210 (H)   70 - 99 mg/dL Final   Glucose reference range applies only to samples taken after fasting for at least 8 hours.  . Calcium, Ion 12/31/2021 1.11 (L)  1.15 - 1.40 mmol/L Final  . TCO2 12/31/2021 27  22 - 32 mmol/L Final  . Hemoglobin 12/31/2021 7.8 (L)  12.0 - 15.0 g/dL Final  . HCT 12/31/2021 23.0 (L)  36.0 - 46.0 % Final  . Glucose-Capillary 12/31/2021 178 (H)  70 - 99 mg/dL Final   Glucose reference range applies only to samples taken after fasting for at least 8 hours.  . Order Confirmation 12/31/2021    Final                   Value:ORDER PROCESSED BY BLOOD BANK BLOOD ALREADY AVAILABLE Performed at [facility], [place] [address] [place], [place] [address]   . WBC 01/01/2022 12.6 (H)  4.0 - 10.5 K/uL Final  . RBC 01/01/2022 3.13 (L)  3.87 - 5.11 MIL/uL Final  . Hemoglobin 01/01/2022 7.9 (L)  12.0 - 15.0 g/dL Final  . HCT 01/01/2022 25.4 (L)  36.0 - 46.0 % Final  . MCV 01/01/2022 81.2  80.0 - 100.0 fL Final  . MCH 01/01/2022 25.2 (L)  26.0 - 34.0 pg Final  . MCHC 01/01/2022 31.1  30.0 - 36.0 g/dL Final  . RDW 01/01/2022 17.6 (H)  11.5 - 15.5 % Final  . Platelets 01/01/2022 190  150 - 400 K/uL Final  . nRBC 01/01/2022 0.0  0.0 - 0.2 % Final   Performed at [facility], [place] [address], [place], [place] [address]  . Sodium 01/01/2022 134 (L)  135 - 145 mmol/L Final  . Potassium 01/01/2022 4.8  3.5 - 5.1 mmol/L Final  . Chloride 01/01/2022 104  98 - 111 mmol/L Final  . CO2 01/01/2022 22  22 - 32 mmol/L Final  . Glucose, Bld 01/01/2022 145 (H)  70 - 99 mg/dL Final   Glucose reference range applies only to samples taken after fasting for at least 8 hours.  . BUN 01/01/2022 24 (H)  8 - 23 mg/dL Final  . Creatinine, Ser 01/01/2022 1.66 (H)  0.44 - 1.00 mg/dL Final  . Calcium 01/01/2022 8.1 (L)  8.9 - 10.3 mg/dL Final  . GFR, Estimated 01/01/2022 34 (L)  >60 mL/min Final   Comment: (NOTE) Calculated using the CKD-EPI Creatinine Equation (2021)   . Anion gap 01/01/2022 8  5 - 15  Final   Performed at [facility] [address], [place], [place] [address]  . Magnesium 01/01/2022 2.1  1.7 - 2.4 mg/dL Final   Performed at [facility], [place] [address], [place], [place] [address]  . Weight 01/01/2022 2,680.79  oz Final  . Height 01/01/2022 65  in Final  . BP 01/01/2022 95/82  mmHg Final  . S' Lateral 01/01/2022 3.10  cm Final  .  AR max vel 01/01/2022 1.87  cm2 Final  . AV Area VTI 01/01/2022 1.86  cm2 Final  . AV Mean grad 01/01/2022 7.0  mmHg Final  . AV Peak grad 01/01/2022 16.0  mmHg Final  . Ao pk vel 01/01/2022 2.00  m/s Final  . Area-P 1/2 01/01/2022 3.24  cm2 Final  . AV Area mean vel 01/01/2022 2.10  cm2 Final  . MV VTI 01/01/2022 1.48  cm2 Final  . Glucose-Capillary 12/31/2021 149 (H)  70 - 99 mg/dL Final   Glucose reference range applies only to samples taken after fasting for at least 8 hours.  . Glucose-Capillary 12/31/2021 139 (H)  70 - 99 mg/dL Final   Glucose reference range applies only to samples taken after fasting for at least 8 hours.  . Glucose-Capillary 01/01/2022 127 (H)  70 - 99 mg/dL Final   Glucose reference range applies only to samples taken after fasting for at least 8 hours.  . Glucose-Capillary 01/01/2022 199 (H)  70 - 99 mg/dL Final   Glucose reference range applies only to samples taken after fasting for at least 8 hours.  . Glucose-Capillary 01/01/2022 226 (H)  70 - 99 mg/dL Final   Glucose reference range applies only to samples taken after fasting for at least 8 hours.  . WBC 01/02/2022 10.7 (H)  4.0 - 10.5 K/uL Final  . RBC 01/02/2022 3.38 (L)  3.87 - 5.11 MIL/uL Final  . Hemoglobin 01/02/2022 8.5 (L)  12.0 - 15.0 g/dL Final  . HCT 01/02/2022 27.4 (L)  36.0 - 46.0 % Final  . MCV 01/02/2022 81.1  80.0 - 100.0 fL Final  . MCH 01/02/2022 25.1 (L)  26.0 - 34.0 pg Final  . MCHC 01/02/2022 31.0  30.0 - 36.0 g/dL Final  . RDW 01/02/2022 18.3 (H)  11.5 - 15.5 % Final  . Platelets 01/02/2022 173  150 - 400 K/uL Final  .  nRBC 01/02/2022 0.0  0.0 - 0.2 % Final   Performed at [facility], [place] [address], [place], [place] [address]  . Sodium 01/02/2022 136  135 - 145 mmol/L Final  . Potassium 01/02/2022 4.7  3.5 - 5.1 mmol/L Final  . Chloride 01/02/2022 102  98 - 111 mmol/L Final  . CO2 01/02/2022 25  22 - 32 mmol/L Final  . Glucose, Bld 01/02/2022 217 (H)  70 - 99 mg/dL Final   Glucose reference range applies only to samples taken after fasting for at least 8 hours.  . BUN 01/02/2022 25 (H)  8 - 23 mg/dL Final  . Creatinine, Ser 01/02/2022 1.83 (H)  0.44 - 1.00 mg/dL Final  . Calcium 01/02/2022 8.7 (L)  8.9 - 10.3 mg/dL Final  . GFR, Estimated 01/02/2022 30 (L)  >60 mL/min Final   Comment: (NOTE) Calculated using the CKD-EPI Creatinine Equation (2021)   . Anion gap 01/02/2022 9  5 - 15 Final   Performed at [facility] [address] [place],  [address]  . Glucose-Capillary 01/01/2022 247 (H)  70 - 99 mg/dL Final   Glucose reference range applies only to samples taken after fasting for at least 8 hours.  . Glucose-Capillary 01/02/2022 187 (H)  70 - 99 mg/dL Final   Glucose reference range applies only to samples taken after fasting for at least 8 hours.  . Comment 1 01/02/2022 Notify RN   Final  . Comment 2 01/02/2022 Document in Chart   Final  Hospital Outpatient Visit on 12/27/2021  Component Date Value Ref Range Status  . SARS Coronavirus 2 12/27/2021 NEGATIVE  NEGATIVE Final   Comment: (NOTE) SARS-CoV-2 target nucleic acids are NOT DETECTED.  The SARS-CoV-2 RNA is generally detectable in upper and lower respiratory specimens during the acute phase of infection. Negative results do not preclude SARS-CoV-2 infection, do not rule out co-infections with other pathogens, and should not be used as the sole basis for treatment or other patient management decisions. Negative results must be combined with clinical observations, patient history, and epidemiological information. The  expected result is Negative.  Fact Sheet for Patients: SugarRoll.be  Fact Sheet for Healthcare Providers: https://www.woods-mathews.com/  This test is not yet approved or cleared by the [place] FDA and  has been authorized for detection and/or diagnosis of SARS-CoV-2 by FDA under an Emergency Use Authorization (EUA). This EUA will remain  in effect (meaning this test can be used) for the duration of the COVID-19 declaration under Se                          ction 564(b)(1) of the Act, 21 U.S.C. section 360bbb-3(b)(1), unless the authorization is terminated or revoked sooner.  Performed at [facility], [place] [address], [place], [place] [address]   . MRSA, PCR 12/27/2021 NEGATIVE  NEGATIVE Final  . Staphylococcus aureus 12/27/2021 NEGATIVE  NEGATIVE Final   Comment: (NOTE) The Xpert SA Assay (FDA approved for NASAL specimens in patients 37 years of age and older), is one component of a comprehensive surveillance program. It is not intended to diagnose infection nor to guide or monitor treatment. Performed at [facility], [place] [address], [place], [place] [address]   . WBC 12/27/2021 8.7  4.0 - 10.5 K/uL Final  . RBC 12/27/2021 3.40 (L)  3.87 - 5.11 MIL/uL Final  . Hemoglobin 12/27/2021 8.3 (L)  12.0 - 15.0 g/dL Final  . HCT 12/27/2021 28.2 (L)  36.0 - 46.0 % Final  . MCV 12/27/2021 82.9  80.0 - 100.0 fL Final  . MCH 12/27/2021 24.4 (L)  26.0 - 34.0 pg Final  . MCHC 12/27/2021 29.4 (L)  30.0 - 36.0 g/dL Final  . RDW 12/27/2021 17.7 (H)  11.5 - 15.5 % Final  . Platelets 12/27/2021 234  150 - 400 K/uL Final  . nRBC 12/27/2021 0.0  0.0 - 0.2 % Final   Performed at [facility], [place] [address] [place], [place] [address]  . Sodium 12/27/2021 136  135 - 145 mmol/L Final  . Potassium 12/27/2021 4.2  3.5 - 5.1 mmol/L Final  . Chloride 12/27/2021 102  98 - 111 mmol/L Final  . CO2 12/27/2021 22  22 - 32 mmol/L Final  .  Glucose, Bld 12/27/2021 402 (H)  70 - 99 mg/dL Final   Glucose reference range applies only to samples taken after fasting for at least 8 hours.  . BUN 12/27/2021 29 (H)  8 - 23 mg/dL Final  . Creatinine, Ser 12/27/2021 1.78 (H)  0.44 - 1.00 mg/dL Final  . Calcium 12/27/2021 8.7 (L)  8.9 - 10.3 mg/dL Final  . Total Protein 12/27/2021 6.6  6.5 - 8.1 g/dL Final  . Albumin 12/27/2021 3.2 (L)  3.5 - 5.0 g/dL Final  . AST 12/27/2021 26  15 - 41 U/L Final  . ALT 12/27/2021 19  0 - 44 U/L Final  . Alkaline Phosphatase 12/27/2021 63  38 - 126 U/L Final  . Total Bilirubin  12/27/2021 0.5  0.3 - 1.2 mg/dL Final  . GFR, Estimated 12/27/2021 31 (L)  >60 mL/min Final   Comment: (NOTE) Calculated using the CKD-EPI Creatinine Equation (2021)   . Anion gap 12/27/2021 12  5 - 15 Final   Performed at [facility] [address] [place], [place] [address]  . Prothrombin Time 12/27/2021 15.7 (H)  11.4 - 15.2 seconds Final  . INR 12/27/2021 1.3 (H)  0.8 - 1.2 Final   Comment: (NOTE) INR goal varies based on device and disease states. Performed at [facility], [place] [address] [place], [place] [address]   . Color, Urine 12/27/2021 STRAW (A)  YELLOW Final  . APPearance 12/27/2021 CLEAR  CLEAR Final  . Specific Gravity, Urine 12/27/2021 1.007  1.005 - 1.030 Final  . pH 12/27/2021 5.0  5.0 - 8.0 Final  . Glucose, UA 12/27/2021 >=500 (A)  NEGATIVE mg/dL Final  . Hgb urine dipstick 12/27/2021 NEGATIVE  NEGATIVE Final  . Bilirubin Urine 12/27/2021 NEGATIVE  NEGATIVE Final  . Ketones, ur 12/27/2021 NEGATIVE  NEGATIVE mg/dL Final  . Protein, ur 12/27/2021 NEGATIVE  NEGATIVE mg/dL Final  . Nitrite 12/27/2021 NEGATIVE  NEGATIVE Final  . Chalmers Guest 12/27/2021 NEGATIVE  NEGATIVE Final  . RBC / HPF 12/27/2021 0-5  0 - 5 RBC/hpf Final  . WBC, UA 12/27/2021 0-5  0 - 5 WBC/hpf Final  . Bacteria, UA 12/27/2021 NONE SEEN  NONE SEEN Final  . Squamous Epithelial / LPF 12/27/2021 0-5  0 - 5 Final    Performed at [facility], [place] [address] [place], [place] [address]  . ABO/RH(D) 12/27/2021 A POS   Final  . Antibody Screen 12/27/2021 NEG   Final  . Sample Expiration 12/27/2021 01/03/2022,2359   Final  . Extend sample reason 12/27/2021 NO TRANSFUSIONS OR PREGNANCY IN THE PAST 3 MONTHS   Final  . Unit Number 12/27/2021 [id number]   Final  . Blood Component Type 12/27/2021 RED CELLS,LR   Final  . Unit division 12/27/2021 00   Final  . Status of Unit 12/27/2021 REL FROM [facility]   Final  . Transfusion Status 12/27/2021 OK TO TRANSFUSE   Final  . Crossmatch Result 12/27/2021    Final                   Value:Compatible Performed at [facility], [place] [address], [place], [place] [address]   . Unit Number 12/27/2021 [id number]   Final  . Blood Component Type 12/27/2021 RED CELLS,LR   Final  . Unit division 12/27/2021 00   Final  . Status of Unit 12/27/2021 ISSUED,FINAL   Final  . Transfusion Status 12/27/2021 OK TO TRANSFUSE   Final  . Crossmatch Result 12/27/2021 Compatible   Final  . Hgb A1c MFr Bld 12/27/2021 9.6 (H)  4.8 - 5.6 % Final   Comment: (NOTE) Pre diabetes:          5.7%-6.4%  Diabetes:              >6.4%  Glycemic control for   <7.0% adults with diabetes   . Mean Plasma Glucose 12/27/2021 228.82  mg/dL Final   Performed at [facility] [address], [place], [place] [address]  . ISSUE DATE / TIME 12/27/2021 [id number]   Final  . Blood Product Unit Number 12/27/2021 [id number]   Final  . PRODUCT CODE 12/27/2021 [id number]   Final  . Unit Type and Rh 12/27/2021 6200  Final  . Blood Product Expiration Date 12/27/2021 [id number]   Final  . ISSUE DATE / TIME 12/27/2021 [id number]   Final  . Blood Product Unit Number 12/27/2021 [id number]   Final  . PRODUCT CODE 12/27/2021 [id number]   Final  . Unit Type and Rh 12/27/2021 6200   Final  . Blood Product Expiration Date 12/27/2021 [id number]   Final    RADIOGRAPHIC STUDIES: I have  personally reviewed the radiological images as listed and agree with the findings in the report  No results found.  ASSESSMENT/PLAN Cancer Staging  No matching staging information was found for the patient.   No problem-specific Assessment & Plan notes found for this encounter.   No orders of the defined types were placed in this encounter.   All questions were answered. The patient knows to call the clinic with any problems, questions or concerns.  This note was electronically signed.    Barbee Cough, MD  01/22/2022 1:35 PM

## 2022-01-23 ENCOUNTER — Other Ambulatory Visit: Payer: Medicare Other

## 2022-01-23 ENCOUNTER — Encounter: Payer: Medicare Other | Admitting: Oncology

## 2022-01-23 ENCOUNTER — Encounter: Payer: Self-pay | Admitting: Oncology

## 2022-01-23 DIAGNOSIS — N183 Chronic kidney disease, stage 3 unspecified: Secondary | ICD-10-CM | POA: Diagnosis not present

## 2022-01-23 DIAGNOSIS — D631 Anemia in chronic kidney disease: Secondary | ICD-10-CM | POA: Diagnosis not present

## 2022-01-23 DIAGNOSIS — D500 Iron deficiency anemia secondary to blood loss (chronic): Secondary | ICD-10-CM | POA: Diagnosis not present

## 2022-01-23 DIAGNOSIS — E1122 Type 2 diabetes mellitus with diabetic chronic kidney disease: Secondary | ICD-10-CM | POA: Diagnosis not present

## 2022-01-23 DIAGNOSIS — I130 Hypertensive heart and chronic kidney disease with heart failure and stage 1 through stage 4 chronic kidney disease, or unspecified chronic kidney disease: Secondary | ICD-10-CM | POA: Diagnosis not present

## 2022-01-23 DIAGNOSIS — Z992 Dependence on renal dialysis: Secondary | ICD-10-CM | POA: Diagnosis not present

## 2022-01-23 DIAGNOSIS — I509 Heart failure, unspecified: Secondary | ICD-10-CM | POA: Diagnosis not present

## 2022-01-23 LAB — FOLATE: Folate: 8.4 ng/mL (ref >5.9)

## 2022-01-23 LAB — CBC WITH DIFFERENTIAL/PLATELET
Abs Immature Granulocytes: 0.02 10*3/uL (ref 0.00–0.07)
Basophils Absolute: 0 10*3/uL (ref 0.0–0.1)
Basophils Relative: 0 %
Eosinophils Absolute: 0.1 10*3/uL (ref 0.0–0.5)
Eosinophils Relative: 2 %
HCT: 28.1 % — ABNORMAL LOW (ref 36.0–46.0)
Hemoglobin: 8.2 g/dL — ABNORMAL LOW (ref 12.0–15.0)
Immature Granulocytes: 0 %
Lymphocytes Relative: 11 %
Lymphs Abs: 0.5 10*3/uL — ABNORMAL LOW (ref 0.7–4.0)
MCH: 24.3 pg — ABNORMAL LOW (ref 26.0–34.0)
MCHC: 29.2 g/dL — ABNORMAL LOW (ref 30.0–36.0)
MCV: 83.1 fL (ref 80.0–100.0)
Monocytes Absolute: 0.3 10*3/uL (ref 0.1–1.0)
Monocytes Relative: 5 %
Neutro Abs: 4.2 10*3/uL (ref 1.7–7.7)
Neutrophils Relative %: 82 %
Platelets: 174 10*3/uL (ref 150–400)
RBC: 3.38 MIL/uL — ABNORMAL LOW (ref 3.87–5.11)
RDW: 17.4 % — ABNORMAL HIGH (ref 11.5–15.5)
WBC: 5.1 10*3/uL (ref 4.0–10.5)
nRBC: 0 % (ref 0.0–0.2)

## 2022-01-23 LAB — VITAMIN B12: Vitamin B-12: 492 pg/mL (ref 180–914)

## 2022-01-23 LAB — COMPREHENSIVE METABOLIC PANEL
ALT: 14 U/L (ref 0–44)
AST: 21 U/L (ref 15–41)
Albumin: 3.4 g/dL — ABNORMAL LOW (ref 3.5–5.0)
Alkaline Phosphatase: 54 U/L (ref 38–126)
Anion gap: 11 (ref 5–15)
BUN: 46 mg/dL — ABNORMAL HIGH (ref 8–23)
CO2: 25 mmol/L (ref 22–32)
Calcium: 8.4 mg/dL — ABNORMAL LOW (ref 8.9–10.3)
Chloride: 102 mmol/L (ref 98–111)
Creatinine, Ser: 2.29 mg/dL — ABNORMAL HIGH (ref 0.44–1.00)
GFR, Estimated: 23 mL/min — ABNORMAL LOW (ref >60)
Glucose, Bld: 344 mg/dL — ABNORMAL HIGH (ref 70–99)
Potassium: 4.5 mmol/L (ref 3.5–5.1)
Sodium: 138 mmol/L (ref 135–145)
Total Bilirubin: 0.4 mg/dL (ref 0.3–1.2)
Total Protein: 7.1 g/dL (ref 6.5–8.1)

## 2022-01-23 LAB — RETICULOCYTES
Immature Retic Fract: 16.7 % — ABNORMAL HIGH (ref 2.3–15.9)
RBC.: 3.38 MIL/uL — ABNORMAL LOW (ref 3.87–5.11)
Retic Count, Absolute: 52.7 10*3/uL (ref 19.0–186.0)
Retic Ct Pct: 1.6 % (ref 0.4–3.1)

## 2022-01-23 LAB — FERRITIN: Ferritin: 24 ng/mL (ref 11–307)

## 2022-01-23 LAB — DIRECT ANTIGLOBULIN TEST (NOT AT ARMC)
DAT, IgG: NEGATIVE
DAT, complement: NEGATIVE

## 2022-01-24 LAB — KAPPA/LAMBDA LIGHT CHAINS
Kappa free light chain: 72.3 mg/L — ABNORMAL HIGH (ref 3.3–19.4)
Kappa, lambda light chain ratio: 2.28 — ABNORMAL HIGH (ref 0.26–1.65)
Lambda free light chains: 31.7 mg/L — ABNORMAL HIGH (ref 5.7–26.3)

## 2022-01-25 LAB — HAPTOGLOBIN: Haptoglobin: 267 mg/dL (ref 37–355)

## 2022-01-28 ENCOUNTER — Ambulatory Visit: Payer: Medicare Other

## 2022-01-28 DIAGNOSIS — E1142 Type 2 diabetes mellitus with diabetic polyneuropathy: Secondary | ICD-10-CM | POA: Diagnosis not present

## 2022-01-28 LAB — MULTIPLE MYELOMA PANEL, SERUM
Albumin SerPl Elph-Mcnc: 3.2 g/dL (ref 2.9–4.4)
Albumin/Glob SerPl: 1.2 (ref 0.7–1.7)
Alpha 1: 0.3 g/dL (ref 0.0–0.4)
Alpha2 Glob SerPl Elph-Mcnc: 0.8 g/dL (ref 0.4–1.0)
B-Globulin SerPl Elph-Mcnc: 0.9 g/dL (ref 0.7–1.3)
Gamma Glob SerPl Elph-Mcnc: 0.9 g/dL (ref 0.4–1.8)
Globulin, Total: 2.9 g/dL (ref 2.2–3.9)
IgA: 252 mg/dL (ref 87–352)
IgG (Immunoglobin G), Serum: 1019 mg/dL (ref 586–1602)
IgM (Immunoglobulin M), Srm: 75 mg/dL (ref 26–217)
Total Protein ELP: 6.1 g/dL (ref 6.0–8.5)

## 2022-01-28 NOTE — Progress Notes (Signed)
HEART AND [place]                                     Cardiology Office Note:    Date:  01/30/2022   ID:  [name], DOB Dec 15, 1955, MRN [id number]  PCP:  [name], MD  [facility] Cardiologist:  Minus Breeding, MD / Dr. Burt Knack & Dr. Cyndia Bent (TAVR) [facility] Electrophysiologist:  None   Referring MD: [name], MD   1 month s/p TAVR  History of Present Illness:    [name] is a 66 y.o. female with a hx of CAD s/p CABG 2009, LBBB, obesity, HTN, HLD, PAD s/p bilateral BKAs, DMT2, peripheral neuropathy, CKD stage IIIb, anemia, chronic systolic CHF (EF 65-03%), mitral valve disease and severe AS s/p TAVR (12/30/21) who presents to clinic for follow up.   She was hospitalized with respiratory failure in 02/2021 and found to be COVID-positive with elevated troponin consistent with demand ischemia. She was then hospitalized again in February 2023 with syncope and hypotension. Hemoglobin decreased to 7.5 and required transfusion. She had maroon-colored stools. Colonoscopy revealed  sigmoid diverticulosis, five subcentimeter colon polyps, and erythematous mucosa in sigmoid colon with biopsies showing patchy mild active colitis negative for features of chronicity. She ultimately underwent a TEE 07/31/21 showing partial fusion of the right and left coronary cusps with a mean gradient 28 mmHg and dimensionless index of 0.23.  Aortic valve area was calculated at 0.73 cm. It was felt that she most likely had severe low-flow/low gradient aortic stenosis. Cardiac cath 10/07/21 showed multivessel CAD with total occlusion of the LAD, left circumflex, and right coronary arteries.  She had continued patency of the LIMA to LAD and free left radial graft to obtuse marginal.  Her vein graft to the right coronary artery and diagonal were occluded and there was extensive collateralization of the RCA branches from left to right  collaterals.    She was evaluated by the multidisciplinary valve team and underwent successful TAVR with a 23 mm Edwards Sapien 3 Ultra Resilia THV via the TF approach on 12/31/21. Post operative echo showed EF 35-40%, normally functioning TAVR with a mean gradient of 7 mmHg and no PVL as well as moderate MAC with mild MR and moderate MS. Hg dropped to 7.1. She was treated with 2U PRBCs. She had a mild transfusion reaction with low grade fever and chills.   She has done well in follow up but continued to have some fatigue. Follow up labs showed worsening anemia. Sent to hematology and started on iron infusions.   Today the patient presents to clinic for follow up. Here with son. She is breathing a lot better. Still has fatigue. No CP or SOB. No LE edema, orthopnea or PND. Did take an extra torsemide today as she had been drinking a lot of extra fluids. No dizziness or syncope. No blood in stool or urine. No palpitations.   Past Medical History:  Diagnosis Date   Anemia, normocytic normochromic 03/14/2008   Arthritis    "hands"   CAD (coronary artery disease) of artery bypass graft 03/14/2008   Chronic kidney disease (CKD) 12/26/2015   stage 3 due to type 2 diabetes mellitus   Chronic pain syndrome 09/25/2015   Coronary artery disease involving native coronary artery without angina pectoris 09/25/2015   Essential hypertension 03/14/2008  Gastroesophageal reflux disease with esophagitis 07/09/2016   Generalized anxiety disorder with panic attacks    History of one panic attack   GERD (gastroesophageal reflux disease)    takes Zantac   Hyperlipidemia, unspecified 03/14/2008   Localization-related symptomatic epilepsy and epileptic syndromes with complex partial seizures, not intractable, without status epilepticus 06/27/2014   Major depressive disorder 03/14/2008   Neuropathy    Osteomyelitis of ankle or foot, left, acute 06/17/2013   Peripheral vascular disease    PVD (peripheral  vascular disease) 03/25/2012   S/P TAVR (transcatheter aortic valve replacement) 12/31/2021   s/p TAVR with a 23 mm Edwards S3UR via the TF approach by Drs Burt Knack & Bartle   Type 2 diabetes mellitus with diabetic mononeuropathy, with long-term current use of insulin 03/14/2008    Past Surgical History:  Procedure Laterality Date   AMPUTATION Left 08/22/2013   Procedure: LEFT BELOW KNEE AMPUTATION;  Surgeon: Newt Minion, MD;  Location: [place];  Service: Orthopedics;  Laterality: Left;   ANKLE FUSION Left 05/11/2013   TIBIOCALCANEAL FUSION    ANKLE FUSION Left 05/12/2013   Procedure: LEFT TIBIOCALCANEAL FUSION;  Surgeon: Newt Minion, MD;  Location: [place];  Service: Orthopedics;  Laterality: Left;  Left Tibiocalcaneal Fusion   APPLICATION OF WOUND VAC  06/17/2013   Procedure: APPLICATION OF WOUND VAC;  Surgeon: Newt Minion, MD;  Location: [place];  Service: Orthopedics;;   BELOW KNEE LEG AMPUTATION Right Jan. 2008   BUBBLE STUDY  07/31/2021   Procedure: BUBBLE STUDY;  Surgeon: Geralynn Rile, MD;  Location: [place];  Service: Cardiovascular;;   CAROTID ENDARTERECTOMY Right    CATARACT EXTRACTION Left 12/25/14   CHOLECYSTECTOMY     COLONOSCOPY     COLONOSCOPY N/A 04/24/2021   Procedure: COLONOSCOPY;  Surgeon: Tol[place], Benay Pike, MD;  Location: [facility];  Service: Gastroenterology;  Laterality: N/A;   CORONARY ARTERY BYPASS GRAFT  12/01/2007   "CABG X4" (05/12/2013)   DILATION AND CURETTAGE OF UTERUS     I & D EXTREMITY Left 06/17/2013   Procedure: IRRIGATION AND DEBRIDEMENT EXTREMITY, PLACEMENT ANTIBIOTIC STIMULAN BEADS;  Surgeon: Newt Minion, MD;  Location: [place];  Service: Orthopedics;  Laterality: Left;   INTRAMEDULLARY (IM) NAIL INTERTROCHANTERIC Left 08/20/2018   Procedure: INTRAMEDULLARY (IM) NAIL INTERTROCHANTRIC;  Surgeon: Shona Needles, MD;  Location: [place];  Service: Orthopedics;  Laterality: Left;   INTRAOPERATIVE TRANSTHORACIC ECHOCARDIOGRAM N/A 12/31/2021    Procedure: INTRAOPERATIVE TRANSTHORACIC ECHOCARDIOGRAM;  Surgeon: Sherren Mocha, MD;  Location: [place];  Service: Open Heart Surgery;  Laterality: N/A;   IRRIGATION AND DEBRIDEMENT ABSCESS Left 06/17/2013   ANKLE           DR [name]   RIGHT/LEFT HEART CATH AND CORONARY/GRAFT ANGIOGRAPHY N/A 10/07/2021   Procedure: RIGHT/LEFT HEART CATH AND CORONARY/GRAFT ANGIOGRAPHY;  Surgeon: Sherren Mocha, MD;  Location: [place] [facility];  Service: Cardiovascular;  Laterality: N/A;   TEE WITHOUT CARDIOVERSION N/A 07/31/2021   Procedure: TRANSESOPHAGEAL ECHOCARDIOGRAM (TEE);  Surgeon: Geralynn Rile, MD;  Location: [place];  Service: Cardiovascular;  Laterality: N/A;   TOE AMPUTATION Right    "took off a couple toes before the amputation"   TONSILLECTOMY     TRANSCATHETER AORTIC VALVE REPLACEMENT, TRANSFEMORAL Right 12/31/2021   Procedure: Transcatheter Aortic Valve Replacement, Transfemoral;  Surgeon: Sherren Mocha, MD;  Location: [place];  Service: Open Heart Surgery;  Laterality: Right;    Current Medications: Current Meds  Medication Sig   acetaminophen-codeine (TYLENOL #3)  300-30 MG tablet Take 1 tablet by mouth every 6 (six) hours as needed for severe pain.   albuterol (VENTOLIN HFA) 108 (90 Base) MCG/ACT inhaler Inhale 1-2 puffs into the lungs every 6 (six) hours as needed for wheezing or shortness of breath.    ALPRAZolam (XANAX) 0.5 MG tablet Take 0.5 mg by mouth 2 (two) times daily as needed for anxiety.   amoxicillin (AMOXIL) 500 MG tablet Take 4 tablets (2,000 mg total) by mouth as directed. 1 hour prior to dental work including cleanings   aspirin 81 MG tablet Take 1 tablet (81 mg total) by mouth daily.   carvedilol (COREG) 6.25 MG tablet Take 1 tablet (6.25 mg total) by mouth 2 (two) times daily.   clopidogrel (PLAVIX) 75 MG tablet Take 1 tablet (75 mg total) by mouth daily.   cycloSPORINE (RESTASIS) 0.05 % ophthalmic emulsion Place 1 drop into both eyes 2 (two) times daily as  needed (dry eyes).   diclofenac Sodium (VOLTAREN) 1 % GEL Apply 1 application. topically 4 (four) times daily as needed (pain).   ezetimibe (ZETIA) 10 MG tablet Take 10 mg by mouth daily.   fluconazole (DIFLUCAN) 150 MG tablet Take 1 tablet by mouth daily as needed (For flair up).   HUMALOG KWIKPEN 100 UNIT/ML KwikPen Inject 12 Units into the skin 2 (two) times daily with a meal.   JARDIANCE 25 MG TABS tablet Take 25 mg by mouth daily.   LANTUS SOLOSTAR 100 UNIT/ML Solostar Pen Inject 38 Units into the skin at bedtime.   levETIRAcetam (KEPPRA XR) 750 MG 24 hr tablet Take 2 tablets (1,500 mg total) by mouth at bedtime.   lidocaine (LIDODERM) 5 % Place 1 patch onto the skin daily.   losartan (COZAAR) 50 MG tablet Take 50 mg by mouth daily.   Multiple Vitamins-Minerals (MULTIVITAMINS THER. W/MINERALS) TABS Take 1 tablet by mouth daily.     mupirocin ointment (BACTROBAN) 2 % Apply 1 Application topically 3 (three) times daily.   pregabalin (LYRICA) 50 MG capsule Take 1 capsule every night   rosuvastatin (CRESTOR) 40 MG tablet Take 40 mg by mouth daily.   sertraline (ZOLOFT) 100 MG tablet Take 1 tablet (100 mg total) by mouth 2 (two) times daily.   [DISCONTINUED] nitroGLYCERIN (NITROSTAT) 0.4 MG SL tablet Place 0.4 mg under the tongue every 5 (five) minutes as needed for chest pain.   [DISCONTINUED] torsemide (DEMADEX) 20 MG tablet Take 20 mg by mouth daily. TAKE EXTRA TABLET AS NEEDED     Allergies:   Metformin hcl, Tape, Ace inhibitors, Actos [pioglitazone], Atorvastatin, Candesartan cilexetil, Gabapentin, Influenza vaccines, Isosorbide nitrate, Keflex [cephalexin], Quinapril hcl, and Tramadol hcl   Social History   Socioeconomic History   Marital status: Married    Spouse name: Not on file   Number of children: 2   Years of education: 14   Highest education level: Associate degree: occupational, Hotel manager, or vocational program  Occupational History   Occupation: Disability  Tobacco Use    Smoking status: Never   Smokeless tobacco: Never  Vaping Use   Vaping Use: Never used  Substance and Sexual Activity   Alcohol use: No   Drug use: No   Sexual activity: Not Currently  Other Topics Concern   Not on file  Social History Narrative   Right handed    Lives in a one story home   Drinks caffeine    Social Determinants of Health   Financial Resource Strain: Not on file  Food Insecurity:  No Food Insecurity (12/31/2021)   Hunger Vital Sign    Worried About Running Out of Food in the Last Year: Never true    Ran Out of Food in the Last Year: Never true  Transportation Needs: No Transportation Needs (12/31/2021)   PRAPARE - Hydrologist (Medical): No    Lack of Transportation (Non-Medical): No  Physical Activity: Not on file  Stress: Not on file  Social Connections: Not on file     Family History: The patient's family history includes Alzheimer's disease in her maternal grandfather; Cancer in her mother; Diabetes in her father; Heart disease in her father; Hyperlipidemia in her father; Hypertension in her father.  ROS:   Please see the history of present illness.    All other systems reviewed and are negative.  EKGs/Labs/Other Studies Reviewed:    The following studies were reviewed today:  TAVR OPERATIVE NOTE     Date of Procedure:                12/31/2021   Preoperative Diagnosis:      Severe Aortic Stenosis    Postoperative Diagnosis:    Same    Procedure:        Transcatheter Aortic Valve Replacement - Percutaneous  Transfemoral Approach             Edwards Sapien 3 Ultra Resilia THV (size 23 mm, serial # [id number])              Co-Surgeons:                        Gaye Pollack, MD and Sherren Mocha, MD   Anesthesiologist:                  Adele Barthel, MD   Echocardiographer:              Jenkins Rouge, MD   Pre-operative Echo Findings: Severe aortic stenosis Mild left ventricular systolic dysfunction LVEF  45%   Post-operative Echo Findings: No paravalvular leak Unchanged left ventricular systolic function   _____________   Echo 01/01/22:  IMPRESSIONS   1. Left ventricular ejection fraction, by estimation, is 35 to 40%. The  left ventricle has moderately decreased function. The left ventricle  demonstrates regional wall motion abnormalities with severe hypokinesis of  the septum. Left ventricular  diastolic parameters are consistent with Grade II diastolic dysfunction  (pseudonormalization).   2. Right ventricular systolic function was not well visualized. The right  ventricular size is not well visualized. There is moderately elevated  pulmonary artery systolic pressure. The estimated right ventricular  systolic pressure is 96.7 mmHg.   3. Left atrial size was mildly dilated.   4. The mitral valve is degenerative. Mild mitral valve regurgitation.  Moderate mitral stenosis. The mean mitral valve gradient is 9.0 mmHg, MVA  1.48 cm^2 by VTI. Moderate mitral annular calcification.   5. Bioprosthetic aortic valve s/p TAVR witih 23 mm Edwards Sapien THV.  Mean gradient 7 mmHg, EOA 1.86 cm^2, DI 0.66. No significant perivalvular  leakage noted.   6. The inferior vena cava is dilated in size with <50% respiratory  variability, suggesting right atrial pressure of 15 mmHg.   7. Technically difficult study with poor acoustic windows. All wall  segments of the LV were not visualized and the RV was poorly visualized  _______________________  Echo 01/29/22 IMPRESSIONS   1. Left ventricular ejection fraction, by estimation, is 50  to 55%. The  left ventricle has low normal function. The left ventricle demonstrates  regional wall motion abnormalities (see scoring diagram/findings for  description). Anteroseptal hypokinesis.  There is mild left ventricular hypertrophy. Left ventricular diastolic  parameters are consistent with Grade II diastolic dysfunction  (pseudonormalization). Elevated  left atrial pressure.   2. Right ventricular systolic function is mildly reduced. The right  ventricular size is normal. There is mildly elevated pulmonary artery  systolic pressure.   3. The mitral valve is abnormal. Mild mitral valve regurgitation.  Moderate mitral stenosis. The mean mitral valve gradient is 8.0 mmHg with  average heart rate of 89 bpm. Severe mitral annular calcification.   4. The aortic valve has been repaired/replaced. Aortic valve  regurgitation is not visualized. There is a 23 mm Edwards Sapien  prosthetic (TAVR) valve present in the aortic position. Aortic valve mean  gradient measures 11.0 mmHg.    EKG:  EKG is ordered today.    Recent Labs: 04/21/2021: B Natriuretic Peptide 583.7 01/01/2022: Magnesium 2.1 01/22/2022: ALT 14; Hemoglobin 8.2; Platelets 174 01/29/2022: BUN 32; Creatinine, Ser 1.79; Potassium 3.8; Sodium 140  Recent Lipid Panel    Component Value Date/Time   CHOL 113 08/30/2013 0550   TRIG 149 08/30/2013 0550   HDL 36 (L) 08/30/2013 0550   CHOLHDL 3.1 08/30/2013 0550   VLDL 30 08/30/2013 0550   LDLCALC 47 08/30/2013 0550     Risk Assessment/Calculations:       Physical Exam:    VS:  BP 129/60   Pulse 91   Ht _0  (1.651 m)   Wt 172 lb 9.6 oz (78.3 kg)   SpO2 96%   BMI 28.72 kg/m     Today's Vitals   01/29/22 1527 01/30/22 0926  BP: (!) 150/62 129/60  Pulse: 91   SpO2: 96%   Weight: 172 lb 9.6 oz (78.3 kg)   Height: _1  (1.651 m)    Body mass index is 28.72 kg/m.   Wt Readings from Last 3 Encounters:  01/29/22 172 lb 9.6 oz (78.3 kg)  01/22/22 176 lb 11.2 oz (80.2 kg)  01/08/22 176 lb 6.4 oz (80 kg)     GEN:  Well nourished, well developed in no acute distress HEENT: Normal NECK: No JVD LYMPHATICS: No lymphadenopathy CARDIAC: RRR, no murmurs, rubs, gallops RESPIRATORY:  Clear to auscultation without rales, wheezing or rhonchi  ABDOMEN: Soft, non-tender, non-distended MUSCULOSKELETAL:  No edema; No deformity   SKIN: Warm and dry  NEUROLOGIC:  Alert and oriented x 3 PSYCHIATRIC:  Normal affect   ASSESSMENT:    1. S/P TAVR (transcatheter aortic valve replacement)   2. Essential hypertension   3. Anemia, unspecified type   4. Stage 3b chronic kidney disease (HCC)   5. HFrEF (heart failure with reduced ejection fraction) ([place])   6. PVD (peripheral vascular disease) ([place])   7. Coronary artery disease involving native heart without angina pectoris, unspecified vessel or lesion type   8. Nonrheumatic aortic valve stenosis     PLAN:    In order of problems listed above:  Severe AS s/p TAVR: echo today shows EF 50-55%, normally functioning TAVR with a mean gradient of 11 mm hg and no PVL as well as severe MAC with mild MR and moderate MS. She has NYHA class II symptoms. SBE prophylaxis discussed; I have RX'd amoxicillin.(Allergy to Keflex but just GI upset). Continue on chronic Asprin and Plavix. I will see her back for 1 year follow up and  echo   HTN: Bp initially elevated but normal upon repeat check at end of visit. No changes made   Chronic anemia: felt to be multifactorial with main components being iron deficiency from GI blood loss exacerbated by ongoing need for antiplatelet therapy and CKD. Now followed by hematology and started on iron infusions.    CKD stage IIIb: creat checked by hematologist and mildly elevated above baseline at 2.29. Baseline 1.6-1.9. WIll recheck today and adjust meds if it remains elevated.    Chronic combined S/D CHF: appears euvolemic. Continue Coreg, Jardiance, Losartan and torsemide.    PAD s/p bilateral BKA: continue medical therapy with aspirin and statin.    CAD s/p CABG: pre TAVR cath showed showed multivessel CAD with total occlusion of the LAD, left circumflex, and right coronary arteries.  She had continued patency of the LIMA to LAD and free left radial graft to obtuse marginal.  Her vein graft to the right coronary artery and diagonal were occluded and  there was extensive collateralization of the RCA branches from left to right collaterals. Continue medical therapy.    Medication Adjustments/Labs and Tests Ordered: Current medicines are reviewed at length with the patient today.  Concerns regarding medicines are outlined above.  Orders Placed This Encounter  Procedures   Basic Metabolic Panel (BMET)   ECHOCARDIOGRAM COMPLETE   Meds ordered this encounter  Medications   nitroGLYCERIN (NITROSTAT) 0.4 MG SL tablet    Sig: Place 1 tablet (0.4 mg total) under the tongue every 5 (five) minutes as needed for chest pain.    Dispense:  25 tablet    Refill:  3   torsemide (DEMADEX) 20 MG tablet    Sig: Take 1 tablet (20 mg total) by mouth daily. TAKE EXTRA TABLET AS NEEDED    Dispense:  90 tablet    Refill:  3    Patient Instructions  Medication Instructions:  Your physician recommends that you continue on your current medications as directed. Please refer to the Current Medication list given to you today.  *If you need a refill on your cardiac medications before your next appointment, please call your pharmacy*   Lab Work: Lab work to be done today--BMP If you have labs (blood work) drawn today and your tests are completely normal, you will receive your results only by: [place] (if you have MyChart) OR A paper copy in the mail If you have any lab test that is abnormal or we need to change your treatment, we will call you to review the results.   Testing/Procedures: Your physician has requested that you have an echocardiogram. Echocardiography is a painless test that uses sound waves to create images of your heart. It provides your doctor with information about the size and shape of your heart and how well your heart's chambers and valves are working. This procedure takes approximately one hour. There are no restrictions for this procedure. Please do NOT wear cologne, perfume, aftershave, or lotions (deodorant is  allowed). Please arrive 15 minutes prior to your appointment time. To be done in about one year   Follow-Up: At [facility], you and your health needs are our priority.  As part of our continuing mission to provide you with exceptional heart care, we have created designated Provider Care Teams.  These Care Teams include your primary Cardiologist (physician) and Advanced Practice Providers (APPs -  Physician Assistants and Nurse Practitioners) who all work together to provide you with the care you need, when you  need it.  We recommend signing up for the patient portal called "MyChart".  Sign up information is provided on this After Visit Summary.  MyChart is used to connect with patients for Virtual Visits (Telemedicine).  Patients are able to view lab/test results, encounter notes, upcoming appointments, etc.  Non-urgent messages can be sent to your provider as well.   To learn more about what you can do with MyChart, go to [email or web address].    Your next appointment:   02/04/22  The format for your next appointment:   In Person  Provider:   Minus Breeding, MD     Other Instructions    Important Information About Sugar         Signed, Angelena Form, PA-C  01/30/2022 9:27 AM    [place]

## 2022-01-29 ENCOUNTER — Encounter: Payer: Self-pay | Admitting: Oncology

## 2022-01-29 ENCOUNTER — Ambulatory Visit (HOSPITAL_COMMUNITY): Payer: Medicare Other | Attending: Cardiology | Admitting: Physician Assistant

## 2022-01-29 ENCOUNTER — Ambulatory Visit (HOSPITAL_BASED_OUTPATIENT_CLINIC_OR_DEPARTMENT_OTHER): Payer: Medicare Other

## 2022-01-29 VITALS — BP 129/60 | HR 91 | Ht 65.0 in | Wt 172.6 lb

## 2022-01-29 DIAGNOSIS — I10 Essential (primary) hypertension: Secondary | ICD-10-CM | POA: Insufficient documentation

## 2022-01-29 DIAGNOSIS — I739 Peripheral vascular disease, unspecified: Secondary | ICD-10-CM | POA: Insufficient documentation

## 2022-01-29 DIAGNOSIS — I350 Nonrheumatic aortic (valve) stenosis: Secondary | ICD-10-CM | POA: Insufficient documentation

## 2022-01-29 DIAGNOSIS — N1832 Chronic kidney disease, stage 3b: Secondary | ICD-10-CM | POA: Insufficient documentation

## 2022-01-29 DIAGNOSIS — Z952 Presence of prosthetic heart valve: Secondary | ICD-10-CM

## 2022-01-29 DIAGNOSIS — I2510 Atherosclerotic heart disease of native coronary artery without angina pectoris: Secondary | ICD-10-CM | POA: Diagnosis not present

## 2022-01-29 DIAGNOSIS — D649 Anemia, unspecified: Secondary | ICD-10-CM | POA: Diagnosis not present

## 2022-01-29 DIAGNOSIS — I5020 Unspecified systolic (congestive) heart failure: Secondary | ICD-10-CM | POA: Diagnosis not present

## 2022-01-29 LAB — ECHOCARDIOGRAM COMPLETE
AR max vel: 0.85 cm2
AV Area VTI: 0.81 cm2
AV Area mean vel: 0.94 cm2
AV Mean grad: 11 mmHg
AV Peak grad: 21.7 mmHg
Ao pk vel: 2.33 m/s
Area-P 1/2: 3.74 cm2
MV VTI: 0.81 cm2
S' Lateral: 3.1 cm

## 2022-01-29 MED ORDER — TORSEMIDE 20 MG PO TABS: 20.0000 mg | ORAL_TABLET | Freq: Every day | ORAL | 3 refills | Status: DC

## 2022-01-29 MED ORDER — NITROGLYCERIN 0.4 MG SL SUBL: 0.4000 mg | SUBLINGUAL_TABLET | SUBLINGUAL | 3 refills | Status: DC | PRN

## 2022-01-29 MED ORDER — PERFLUTREN LIPID MICROSPHERE
1.0000 mL | INTRAVENOUS | Status: AC | PRN
  Administered 2022-01-29: 1 mL via INTRAVENOUS

## 2022-01-29 NOTE — Patient Instructions (Signed)
Medication Instructions:  Your physician recommends that you continue on your current medications as directed. Please refer to the Current Medication list given to you today.  *If you need a refill on your cardiac medications before your next appointment, please call your pharmacy*   Lab Work: Lab work to be done today--BMP If you have labs (blood work) drawn today and your tests are completely normal, you will receive your results only by: [place] (if you have MyChart) OR A paper copy in the mail If you have any lab test that is abnormal or we need to change your treatment, we will call you to review the results.   Testing/Procedures: Your physician has requested that you have an echocardiogram. Echocardiography is a painless test that uses sound waves to create images of your heart. It provides your doctor with information about the size and shape of your heart and how well your heart's chambers and valves are working. This procedure takes approximately one hour. There are no restrictions for this procedure. Please do NOT wear cologne, perfume, aftershave, or lotions (deodorant is allowed). Please arrive 15 minutes prior to your appointment time. To be done in about one year   Follow-Up: At [facility], you and your health needs are our priority.  As part of our continuing mission to provide you with exceptional heart care, we have created designated Provider Care Teams.  These Care Teams include your primary Cardiologist (physician) and Advanced Practice Providers (APPs -  Physician Assistants and Nurse Practitioners) who all work together to provide you with the care you need, when you need it.  We recommend signing up for the patient portal called "MyChart".  Sign up information is provided on this After Visit Summary.  MyChart is used to connect with patients for Virtual Visits (Telemedicine).  Patients are able to view lab/test results, encounter notes, upcoming  appointments, etc.  Non-urgent messages can be sent to your provider as well.   To learn more about what you can do with MyChart, go to [email or web address].    Your next appointment:   02/04/22  The format for your next appointment:   In Person  Provider:   Minus Breeding, MD     Other Instructions    Important Information About Sugar

## 2022-01-30 LAB — BASIC METABOLIC PANEL
BUN/Creatinine Ratio: 18 (ref 12–28)
BUN: 32 mg/dL — ABNORMAL HIGH (ref 8–27)
CO2: 26 mmol/L (ref 20–29)
Calcium: 9.2 mg/dL (ref 8.7–10.3)
Chloride: 96 mmol/L (ref 96–106)
Creatinine, Ser: 1.79 mg/dL — ABNORMAL HIGH (ref 0.57–1.00)
Glucose: 256 mg/dL — ABNORMAL HIGH (ref 70–99)
Potassium: 3.8 mmol/L (ref 3.5–5.2)
Sodium: 140 mmol/L (ref 134–144)
eGFR: 31 mL/min/{1.73_m2} — ABNORMAL LOW (ref >59)

## 2022-02-03 NOTE — Progress Notes (Unsigned)
Cardiology Office Note   Date:  02/04/2022   ID:  [name], DOB Apr 27, 1955, MRN [id number]  PCP:  [name], MD  Cardiologist:   Minus Breeding, MD Referring:  [name], MD   Chief Complaint  Patient presents with   AVR    History of Present Illness:   [name] is a 65 y.o. female who presents for evaluation of CAD and systolic HF.  She has had a history of CABG, bilateral lower extremity amputations chronic kidney disease.  She was admitted to [facility] .   She had acute on chronic heart failure.  Her discharge weight was 177 pounds.  She does have chronic renal insufficiency with her last creatinine being 1.74.  An echocardiogram in September at [facility] demonstrated an EF of 30 to 35%.  There was thinning and akinesis of the mid anterior, mid anteroseptal, basal anterior and basal anteroseptal segments.  There was moderately thickened aortic valve with severe aortic stenosis.  There was mild mitral regurgitation.  I was going to consider cardiac cath but her creat was increased when I saw her in the office for the first visit.  The creat was 2.83.  I reduced her Demadex.  Echo was ordered.  Preliminary reading still demonstrates that the ejection fraction is reduced with severe aortic stenosis.  She was admitted to [facility] in Dec with respiratory failure.  She was COVID positive.  She had elevated trop with demand ischemia.    EF was again 35 - 40%.  She was back in the hospital in Feb with syncope.  She had hypotension.  She also had anemia.  Colonoscopy did not demonstrate active bleed.  She had AKI.    She had a TEE.    This demonstrated severe aortic stenosis.  There was moderate MR.  EF was 40 - 45%.  The tricuspid valve was heavily calcified.  There was moderate mitral regurgitation secondary to a restricted posterior leaflet.  There is mild mitral valve stenosis.  Aortic stenosis was severe.  This is expected to be low-flow low gradient.  Was heavily  calcified.  There was plaque in the aorta noted.  She is now status post TAVR.   Follow-up echo done last week demonstrated normal functioning TAVR.  Her ejection fraction is up to 50 to 55%.  She still has a heavily calcified mitral valve with moderate stenosis and mild regurgitation.  Overall she feels better.  She is not having the cough she was having.  She is not having pain when she takes a deep breath.  She is having no acute shortness of breath.  She denies any palpitations, presyncope or syncope.  She has been seen by hematology and is going to start iron infusions for her anemia.   Past Medical History:  Diagnosis Date   Anemia, normocytic normochromic 03/14/2008   Arthritis    "hands"   CAD (coronary artery disease) of artery bypass graft 03/14/2008   Chronic kidney disease (CKD) 12/26/2015   stage 3 due to type 2 diabetes mellitus   Chronic pain syndrome 09/25/2015   Coronary artery disease involving native coronary artery without angina pectoris 09/25/2015   Essential hypertension 03/14/2008   Gastroesophageal reflux disease with esophagitis 07/09/2016   Generalized anxiety disorder with panic attacks    History of one panic attack   GERD (gastroesophageal reflux disease)    takes Zantac   Hyperlipidemia, unspecified 03/14/2008   Localization-related symptomatic epilepsy and epileptic syndromes with  complex partial seizures, not intractable, without status epilepticus 06/27/2014   Major depressive disorder 03/14/2008   Neuropathy    Osteomyelitis of ankle or foot, left, acute 06/17/2013   Peripheral vascular disease    PVD (peripheral vascular disease) 03/25/2012   S/P TAVR (transcatheter aortic valve replacement) 12/31/2021   s/p TAVR with a 23 mm Edwards S3UR via the TF approach by Drs Burt Knack & Bartle   Type 2 diabetes mellitus with diabetic mononeuropathy, with long-term current use of insulin 03/14/2008    Past Surgical History:  Procedure Laterality Date    AMPUTATION Left 08/22/2013   Procedure: LEFT BELOW KNEE AMPUTATION;  Surgeon: Newt Minion, MD;  Location: [place];  Service: Orthopedics;  Laterality: Left;   ANKLE FUSION Left 05/11/2013   TIBIOCALCANEAL FUSION    ANKLE FUSION Left 05/12/2013   Procedure: LEFT TIBIOCALCANEAL FUSION;  Surgeon: Newt Minion, MD;  Location: [place];  Service: Orthopedics;  Laterality: Left;  Left Tibiocalcaneal Fusion   APPLICATION OF WOUND VAC  06/17/2013   Procedure: APPLICATION OF WOUND VAC;  Surgeon: Newt Minion, MD;  Location: [place];  Service: Orthopedics;;   BELOW KNEE LEG AMPUTATION Right Jan. 2008   BUBBLE STUDY  07/31/2021   Procedure: BUBBLE STUDY;  Surgeon: Geralynn Rile, MD;  Location: [place];  Service: Cardiovascular;;   CAROTID ENDARTERECTOMY Right    CATARACT EXTRACTION Left 12/25/14   CHOLECYSTECTOMY     COLONOSCOPY     COLONOSCOPY N/A 04/24/2021   Procedure: COLONOSCOPY;  Surgeon: [place], Benay Pike, MD;  Location: [facility];  Service: Gastroenterology;  Laterality: N/A;   CORONARY ARTERY BYPASS GRAFT  12/01/2007   "CABG X4" (05/12/2013)   DILATION AND CURETTAGE OF UTERUS     I & D EXTREMITY Left 06/17/2013   Procedure: IRRIGATION AND DEBRIDEMENT EXTREMITY, PLACEMENT ANTIBIOTIC STIMULAN BEADS;  Surgeon: Newt Minion, MD;  Location: [place];  Service: Orthopedics;  Laterality: Left;   INTRAMEDULLARY (IM) NAIL INTERTROCHANTERIC Left 08/20/2018   Procedure: INTRAMEDULLARY (IM) NAIL INTERTROCHANTRIC;  Surgeon: Shona Needles, MD;  Location: [place];  Service: Orthopedics;  Laterality: Left;   INTRAOPERATIVE TRANSTHORACIC ECHOCARDIOGRAM N/A 12/31/2021   Procedure: INTRAOPERATIVE TRANSTHORACIC ECHOCARDIOGRAM;  Surgeon: Sherren Mocha, MD;  Location: [place];  Service: Open Heart Surgery;  Laterality: N/A;   IRRIGATION AND DEBRIDEMENT ABSCESS Left 06/17/2013   ANKLE           DR [name]   RIGHT/LEFT HEART CATH AND CORONARY/GRAFT ANGIOGRAPHY N/A 10/07/2021   Procedure: RIGHT/LEFT HEART CATH AND  CORONARY/GRAFT ANGIOGRAPHY;  Surgeon: Sherren Mocha, MD;  Location: [place] [facility];  Service: Cardiovascular;  Laterality: N/A;   TEE WITHOUT CARDIOVERSION N/A 07/31/2021   Procedure: TRANSESOPHAGEAL ECHOCARDIOGRAM (TEE);  Surgeon: Geralynn Rile, MD;  Location: [place];  Service: Cardiovascular;  Laterality: N/A;   TOE AMPUTATION Right    "took off a couple toes before the amputation"   TONSILLECTOMY     TRANSCATHETER AORTIC VALVE REPLACEMENT, TRANSFEMORAL Right 12/31/2021   Procedure: Transcatheter Aortic Valve Replacement, Transfemoral;  Surgeon: Sherren Mocha, MD;  Location: [place];  Service: Open Heart Surgery;  Laterality: Right;     Current Outpatient Medications  Medication Sig Dispense Refill   acetaminophen-codeine (TYLENOL #3) 300-30 MG tablet Take 1 tablet by mouth every 6 (six) hours as needed for severe pain.     albuterol (VENTOLIN HFA) 108 (90 Base) MCG/ACT inhaler Inhale 1-2 puffs into the lungs every 6 (six) hours as needed for wheezing or shortness  of breath.      ALPRAZolam (XANAX) 0.5 MG tablet Take 0.5 mg by mouth 2 (two) times daily as needed for anxiety.     amoxicillin (AMOXIL) 500 MG tablet Take 4 tablets (2,000 mg total) by mouth as directed. 1 hour prior to dental work including cleanings 12 tablet 12   aspirin 81 MG tablet Take 1 tablet (81 mg total) by mouth daily. 90 tablet 0   carvedilol (COREG) 6.25 MG tablet Take 1 tablet (6.25 mg total) by mouth 2 (two) times daily. 180 tablet 3   clopidogrel (PLAVIX) 75 MG tablet Take 1 tablet (75 mg total) by mouth daily. 90 tablet 0   cycloSPORINE (RESTASIS) 0.05 % ophthalmic emulsion Place 1 drop into both eyes 2 (two) times daily as needed (dry eyes).     diclofenac Sodium (VOLTAREN) 1 % GEL Apply 1 application. topically 4 (four) times daily as needed (pain).     ezetimibe (ZETIA) 10 MG tablet Take 10 mg by mouth daily.     fluconazole (DIFLUCAN) 150 MG tablet Take 1 tablet by mouth daily as needed  (For flair up).     HUMALOG KWIKPEN 100 UNIT/ML KwikPen Inject 12 Units into the skin 2 (two) times daily with a meal.     JARDIANCE 25 MG TABS tablet Take 25 mg by mouth daily.     LANTUS SOLOSTAR 100 UNIT/ML Solostar Pen Inject 38 Units into the skin at bedtime.     levETIRAcetam (KEPPRA XR) 750 MG 24 hr tablet Take 2 tablets (1,500 mg total) by mouth at bedtime. 180 tablet 3   lidocaine (LIDODERM) 5 % Place 1 patch onto the skin daily.     losartan (COZAAR) 50 MG tablet Take 50 mg by mouth daily.     Multiple Vitamins-Minerals (MULTIVITAMINS THER. W/MINERALS) TABS Take 1 tablet by mouth daily.       mupirocin ointment (BACTROBAN) 2 % Apply 1 Application topically 3 (three) times daily.     nitroGLYCERIN (NITROSTAT) 0.4 MG SL tablet Place 1 tablet (0.4 mg total) under the tongue every 5 (five) minutes as needed for chest pain. 25 tablet 3   pregabalin (LYRICA) 50 MG capsule Take 1 capsule every night 90 capsule 2   rosuvastatin (CRESTOR) 40 MG tablet Take 40 mg by mouth daily.     sertraline (ZOLOFT) 100 MG tablet Take 1 tablet (100 mg total) by mouth 2 (two) times daily. 180 tablet 0   torsemide (DEMADEX) 20 MG tablet Take 1 tablet (20 mg total) by mouth daily. TAKE EXTRA TABLET AS NEEDED 90 tablet 3   No current facility-administered medications for this visit.    Allergies:   Metformin hcl, Tape, Ace inhibitors, Actos [pioglitazone], Atorvastatin, Candesartan cilexetil, Gabapentin, Influenza vaccines, Isosorbide nitrate, Keflex [cephalexin], Quinapril hcl, and Tramadol hcl   ROS:  Please see the history of present illness.   Otherwise, review of systems are positive for none .   All other systems are reviewed and negative.    PHYSICAL EXAM: VS:  BP 134/62 (BP Location: Left Arm, Patient Position: Sitting, Cuff Size: Large)   Pulse 98   Ht '5\' 5"'$  (1.651 m)   Wt 174 lb (78.9 kg)   SpO2 99%   BMI 28.96 kg/m  , BMI Body mass index is 28.96 kg/m. GEN:  No distress NECK:  No jugular  venous distention at 90 degrees, waveform within normal limits, carotid upstroke brisk and symmetric, no bruits, no thyromegaly LUNGS:  Clear to auscultation bilaterally BACK:  No CVA tenderness CHEST:  Well healed sternotomy scar. HEART:  S1 and S2 within normal limits, no S3, no S4, no clicks, no rubs, no murmurs ABD:  Positive bowel sounds normal in frequency in pitch, no bruits, no rebound, no guarding, unable to assess midline mass or bruit with the patient seated. EXT:  2 plus pulses upper status post bilateral lower extremity amputations  EKG:  EKG is none ordered today.   Recent Labs: 04/21/2021: B Natriuretic Peptide 583.7 01/01/2022: Magnesium 2.1 01/22/2022: ALT 14; Hemoglobin 8.2; Platelets 174 01/29/2022: BUN 32; Creatinine, Ser 1.79; Potassium 3.8; Sodium 140    Lipid Panel    Component Value Date/Time   CHOL 113 08/30/2013 0550   TRIG 149 08/30/2013 0550   HDL 36 (L) 08/30/2013 0550   CHOLHDL 3.1 08/30/2013 0550   VLDL 30 08/30/2013 0550   LDLCALC 47 08/30/2013 0550      Wt Readings from Last 3 Encounters:  02/04/22 174 lb (78.9 kg)  01/29/22 172 lb 9.6 oz (78.3 kg)  01/22/22 176 lb 11.2 oz (80.2 kg)      Other studies Reviewed: Additional studies/ records that were reviewed today include: Hospital records Review of the above records demonstrates: See elsewhere  ASSESSMENT AND PLAN:  CHRONIC SYSTOLIC HF:    Her ejection fraction has improved.  She seems to be euvolemic.  No change in therapy.  TAVR: She did really well post TAVR.  No change in therapy.   HTN:   This is at target.  No change in therapy.  CAD/CABG:   She is having no unstable angina.  She will continue with risk reduction.   MITRAL REGURGITATION:   This was moderate.  This would be managed conservatively.  I will follow this clinically.   AKI:    Creat most recently was 1.79 which is about her baseline.    ACUTE ANEMIA:   This is being managed as above.  Current medicines adoes not  havere reviewed at length with the patient today.  The patient  concerns regarding medicines.  The following changes have been made:  None  Labs/ tests ordered today include:    None  No orders of the defined types were placed in this encounter.    Disposition:   FU with me in six months.   Signed, Minus Breeding, MD  02/04/2022 5:54 PM    [place]

## 2022-02-04 ENCOUNTER — Ambulatory Visit: Payer: Medicare Other | Attending: Cardiology | Admitting: Cardiology

## 2022-02-04 ENCOUNTER — Encounter: Payer: Self-pay | Admitting: Cardiology

## 2022-02-04 VITALS — BP 134/62 | HR 98 | Ht 65.0 in | Wt 174.0 lb

## 2022-02-04 DIAGNOSIS — I10 Essential (primary) hypertension: Secondary | ICD-10-CM

## 2022-02-04 DIAGNOSIS — I2510 Atherosclerotic heart disease of native coronary artery without angina pectoris: Secondary | ICD-10-CM

## 2022-02-04 DIAGNOSIS — I5022 Chronic systolic (congestive) heart failure: Secondary | ICD-10-CM

## 2022-02-04 DIAGNOSIS — Z952 Presence of prosthetic heart valve: Secondary | ICD-10-CM

## 2022-02-04 DIAGNOSIS — I340 Nonrheumatic mitral (valve) insufficiency: Secondary | ICD-10-CM

## 2022-02-04 NOTE — Patient Instructions (Signed)
Medication Instructions:  Your physician recommends that you continue on your current medications as directed. Please refer to the Current Medication list given to you today.  *If you need a refill on your cardiac medications before your next appointment, please call your pharmacy*  Follow-Up: At [facility], you and your health needs are our priority.  As part of our continuing mission to provide you with exceptional heart care, we have created designated Provider Care Teams.  These Care Teams include your primary Cardiologist (physician) and Advanced Practice Providers (APPs -  Physician Assistants and Nurse Practitioners) who all work together to provide you with the care you need, when you need it.  We recommend signing up for the patient portal called "MyChart".  Sign up information is provided on this After Visit Summary.  MyChart is used to connect with patients for Virtual Visits (Telemedicine).  Patients are able to view lab/test results, encounter notes, upcoming appointments, etc.  Non-urgent messages can be sent to your provider as well.   To learn more about what you can do with MyChart, go to [email or web address].    Your next appointment:   6 month(s)  The format for your next appointment:   In Person  Provider:   James Hochrein, MD         

## 2022-02-10 ENCOUNTER — Inpatient Hospital Stay: Payer: Medicare Other

## 2022-02-10 VITALS — BP 146/61 | HR 96 | Temp 98.9°F | Resp 20 | Ht 65.0 in | Wt 175.5 lb

## 2022-02-10 DIAGNOSIS — D631 Anemia in chronic kidney disease: Secondary | ICD-10-CM | POA: Diagnosis not present

## 2022-02-10 DIAGNOSIS — I130 Hypertensive heart and chronic kidney disease with heart failure and stage 1 through stage 4 chronic kidney disease, or unspecified chronic kidney disease: Secondary | ICD-10-CM | POA: Diagnosis not present

## 2022-02-10 DIAGNOSIS — D539 Nutritional anemia, unspecified: Secondary | ICD-10-CM

## 2022-02-10 DIAGNOSIS — N183 Chronic kidney disease, stage 3 unspecified: Secondary | ICD-10-CM | POA: Diagnosis not present

## 2022-02-10 DIAGNOSIS — Z992 Dependence on renal dialysis: Secondary | ICD-10-CM | POA: Diagnosis not present

## 2022-02-10 DIAGNOSIS — D500 Iron deficiency anemia secondary to blood loss (chronic): Secondary | ICD-10-CM | POA: Diagnosis not present

## 2022-02-10 DIAGNOSIS — D649 Anemia, unspecified: Secondary | ICD-10-CM

## 2022-02-10 DIAGNOSIS — I509 Heart failure, unspecified: Secondary | ICD-10-CM | POA: Diagnosis not present

## 2022-02-10 DIAGNOSIS — E1122 Type 2 diabetes mellitus with diabetic chronic kidney disease: Secondary | ICD-10-CM | POA: Diagnosis not present

## 2022-02-10 MED ORDER — SODIUM CHLORIDE 0.9 % IV SOLN
510.0000 mg | Freq: Once | INTRAVENOUS | Status: AC
  Administered 2022-02-10: 510 mg via INTRAVENOUS
  Filled 2022-02-10: qty 510

## 2022-02-10 MED ORDER — ACETAMINOPHEN 325 MG PO TABS
650.0000 mg | ORAL_TABLET | Freq: Once | ORAL | Status: AC
  Administered 2022-02-10: 650 mg via ORAL
  Filled 2022-02-10: qty 2

## 2022-02-10 MED ORDER — SODIUM CHLORIDE 0.9 % IV SOLN: Freq: Once | INTRAVENOUS | Status: AC

## 2022-02-10 MED ORDER — LORATADINE 10 MG PO TABS
10.0000 mg | ORAL_TABLET | Freq: Every day | ORAL | Status: DC
  Administered 2022-02-10: 10 mg via ORAL
  Filled 2022-02-10: qty 1

## 2022-02-10 NOTE — Patient Instructions (Signed)
Iron Deficiency Anemia, Adult  Iron deficiency anemia is a condition in which the concentration of red blood cells or hemoglobin in the blood is below normal because of too little iron. Hemoglobin is a substance in red blood cells that carries oxygen to the body's tissues. When the concentration of red blood cells or hemoglobin is too low, not enough oxygen reaches these tissues. Iron deficiency anemia is usually long-lasting, and it develops over time. It may or may not cause symptoms. It is a common type of anemia. What are the causes? This condition may be caused by: Not enough iron in the diet. Abnormal absorption in the gut. Blood loss. What increases the risk? You are more likely to develop this condition if you get menstrual periods (menstruate) or are pregnant. What are the signs or symptoms? Symptoms of this condition may include: Pale skin, lips, and nail beds. Weakness, dizziness, and getting tired easily. Shortness of breath when moving or exercising. Cold hands or feet. Mild anemia may not cause any symptoms. How is this diagnosed? This condition is diagnosed based on: Your medical history. A physical exam. Blood tests. How is this treated? This condition is treated by correcting the cause of your iron deficiency. Treatment may involve: Adding iron-rich foods to your diet. Taking iron supplements. If you are pregnant or breastfeeding, you may need to take extra iron because your normal diet usually does not provide the amount of iron that you need. Increasing vitamin C intake. Vitamin C helps your body absorb iron. Your health care provider may recommend that you take iron supplements along with a glass of orange juice or a vitamin C supplement. Medicines to make heavy menstrual flow lighter. Surgery or additional testing procedures to determine the cause of your anemia. You may need repeat blood tests to determine whether treatment is working. If the treatment does not  seem to be working, you may need more tests. Follow these instructions at home: Medicines Take over-the-counter and prescription medicines only as told by your health care provider. This includes iron supplements and vitamins. This is important because too much iron can be harmful. For the best iron absorption, you should take iron supplements when your stomach is empty. If you cannot tolerate them on an empty stomach, you may need to take them with food. Do not drink milk or take antacids at the same time as your iron supplements. Milk and antacids may interfere with how your body absorbs iron. Iron supplements may turn stool (feces) a darker color and it may appear black. If you cannot tolerate taking iron supplements by mouth, talk with your health care provider about taking them through an IV or through an injection into a muscle. Eating and drinking Talk with your health care provider before changing your diet. Your provider may recommend that you eat foods that contain a lot of iron, such as: Liver. Low-fat (lean) beef. Breads and cereals that have iron added to them (are fortified). Eggs. Dried fruit. Dark green, leafy vegetables. To help your body use the iron from iron-rich foods, eat those foods at the same time as fresh fruits and vegetables that are high in vitamin C. Foods that are high in vitamin C include: Oranges. Peppers. Tomatoes. Mangoes. Managing constipation If you are taking an iron supplement, it may cause constipation. To prevent or treat constipation, you may need to: Drink enough fluid to keep your urine pale yellow. Take over-the-counter or prescription medicines. Eat foods that are high in fiber, such   as beans, whole grains, and fresh fruits and vegetables. Limit foods that are high in fat and processed sugars, such as fried or sweet foods. General instructions Return to your normal activities as told by your health care provider. Ask your health care provider  what activities are safe for you. Keep all follow-up visits. Contact a health care provider if: You feel nauseous or you vomit. You feel weak. You become light-headed when getting up from a sitting or lying down position. You have unexplained sweating. You develop symptoms of constipation. You have a heaviness in your chest. You have trouble breathing with physical activity. Get help right away if: You faint. If this happens, do not drive yourself to the hospital. You have an irregular or rapid heartbeat. Summary Iron deficiency anemia is a condition in which the concentration of red blood cells or hemoglobin in the blood is below normal because of too little iron. This condition is treated by correcting the cause of your iron deficiency. Take over-the-counter and prescription medicines only as told by your health care provider. This includes iron supplements and vitamins. To help your body use the iron from iron-rich foods, eat those foods at the same time as fresh fruits and vegetables that are high in vitamin C. Seek medical help if you have signs or symptoms of worsening anemia. This information is not intended to replace advice given to you by your health care provider. Make sure you discuss any questions you have with your health care provider. Document Revised: 04/10/2021 Document Reviewed: 04/10/2021 Elsevier Patient Education  2023 Elsevier Inc[employer] Iron-Rich Diet  Iron is a mineral that helps your body produce hemoglobin. Hemoglobin is a protein in red blood cells that carries oxygen to your body's tissues. Eating too little iron may cause you to feel weak and tired, and it can increase your risk of infection. Iron is naturally found in many foods, and many foods have iron added to them (are iron-fortified). You may need to follow an iron-rich diet if you do not have enough iron in your body due to certain medical conditions. The amount of iron that you need each day depends on your  age, your sex, and any medical conditions you have. Follow instructions from your health care provider or a dietitian about how much iron you should eat each day. What are tips for following this plan? Reading food labels Check food labels to see how many milligrams (mg) of iron are in each serving. Cooking Cook foods in pots and pans that are made from iron. Take these steps to make it easier for your body to absorb iron from certain foods: Soak beans overnight before cooking. Soak whole grains overnight and drain them before using. Ferment flours before baking, such as by using yeast in bread dough. Meal planning When you eat foods that contain iron, you should eat them with foods that are high in vitamin C. These include oranges, peppers, tomatoes, potatoes, and mangoes. Vitamin C helps your body absorb iron. Certain foods and drinks prevent your body from absorbing iron properly. Avoid eating these foods in the same meal as iron-rich foods or with iron supplements. These foods include: Coffee, black tea, and red wine. Milk, dairy products, and foods that are high in calcium. Beans and soybeans. Whole grains. General information Take iron supplements only as told by your health care provider. An overdose of iron can be life-threatening. If you were prescribed iron supplements, take them with orange juice or a vitamin C   supplement. When you eat iron-fortified foods or take an iron supplement, you should also eat foods that naturally contain iron, such as meat, poultry, and fish. Eating naturally iron-rich foods helps your body absorb the iron that is added to other foods or contained in a supplement. Iron from animal sources is better absorbed than iron from plant sources. What foods should I eat? Fruits Prunes. Raisins. Eat fruits high in vitamin C, such as oranges, grapefruits, and strawberries, with iron-rich foods. Vegetables Spinach (cooked). Green peas. Broccoli. Fermented  vegetables. Eat vegetables high in vitamin C, such as leafy greens, potatoes, bell peppers, and tomatoes, with iron-rich foods. Grains Iron-fortified breakfast cereal. Iron-fortified whole-wheat bread. Enriched rice. Sprouted grains. Meats and other proteins Beef liver. Beef. Turkey. Chicken. Oysters. Shrimp. Tuna. Sardines. Chickpeas. Nuts. Tofu. Pumpkin seeds. Beverages Tomato juice. Fresh orange juice. Prune juice. Hibiscus tea. Iron-fortified instant breakfast shakes. Sweets and desserts Blackstrap molasses. Seasonings and condiments Tahini. Fermented soy sauce. Other foods Wheat germ. The items listed above may not be a complete list of recommended foods and beverages. Contact a dietitian for more information. What foods should I limit? These are foods that should be limited while eating iron-rich foods as they can reduce the absorption of iron in your body. Grains Whole grains. Bran cereal. Bran flour. Meats and other proteins Soybeans. Products made from soy protein. Black beans. Lentils. Mung beans. Split peas. Dairy Milk. Cream. Cheese. Yogurt. Cottage cheese. Beverages Coffee. Black tea. Red wine. Sweets and desserts Cocoa. Chocolate. Ice cream. Seasonings and condiments Basil. Oregano. Large amounts of parsley. The items listed above may not be a complete list of foods and beverages you should limit. Contact a dietitian for more information. Summary Iron is a mineral that helps your body produce hemoglobin. Hemoglobin is a protein in red blood cells that carries oxygen to your body's tissues. Iron is naturally found in many foods, and many foods have iron added to them (are iron-fortified). When you eat foods that contain iron, you should eat them with foods that are high in vitamin C. Vitamin C helps your body absorb iron. Certain foods and drinks prevent your body from absorbing iron properly, such as whole grains and dairy products. You should avoid eating these foods  in the same meal as iron-rich foods or with iron supplements. This information is not intended to replace advice given to you by your health care provider. Make sure you discuss any questions you have with your health care provider. Document Revised: 02/13/2020 Document Reviewed: 02/13/2020 Elsevier Patient Education  2023 Elsevier Inc.  

## 2022-02-13 ENCOUNTER — Encounter: Payer: Self-pay | Admitting: Oncology

## 2022-02-13 MED FILL — Ferumoxytol Inj 510 MG/17ML (30 MG/ML) (Elemental Fe): INTRAVENOUS | Qty: 17 | Status: AC

## 2022-02-13 NOTE — Addendum Note (Signed)
Addended by: Juanetta Beets on: 02/13/2022 01:51 PM   Modules accepted: Orders

## 2022-02-14 ENCOUNTER — Inpatient Hospital Stay: Payer: Medicare Other | Attending: Oncology

## 2022-02-14 VITALS — BP 186/81 | HR 102 | Temp 98.6°F | Resp 14 | Ht 65.0 in | Wt 176.0 lb

## 2022-02-14 DIAGNOSIS — D500 Iron deficiency anemia secondary to blood loss (chronic): Secondary | ICD-10-CM | POA: Insufficient documentation

## 2022-02-14 DIAGNOSIS — D631 Anemia in chronic kidney disease: Secondary | ICD-10-CM | POA: Insufficient documentation

## 2022-02-14 DIAGNOSIS — D649 Anemia, unspecified: Secondary | ICD-10-CM

## 2022-02-14 DIAGNOSIS — I509 Heart failure, unspecified: Secondary | ICD-10-CM | POA: Diagnosis not present

## 2022-02-14 DIAGNOSIS — E1122 Type 2 diabetes mellitus with diabetic chronic kidney disease: Secondary | ICD-10-CM | POA: Diagnosis not present

## 2022-02-14 DIAGNOSIS — N183 Chronic kidney disease, stage 3 unspecified: Secondary | ICD-10-CM | POA: Insufficient documentation

## 2022-02-14 DIAGNOSIS — I130 Hypertensive heart and chronic kidney disease with heart failure and stage 1 through stage 4 chronic kidney disease, or unspecified chronic kidney disease: Secondary | ICD-10-CM | POA: Diagnosis not present

## 2022-02-14 DIAGNOSIS — D539 Nutritional anemia, unspecified: Secondary | ICD-10-CM

## 2022-02-14 MED ORDER — LORATADINE 10 MG PO TABS
10.0000 mg | ORAL_TABLET | Freq: Every day | ORAL | Status: DC
  Administered 2022-02-14: 10 mg via ORAL
  Filled 2022-02-14: qty 1

## 2022-02-14 MED ORDER — ACETAMINOPHEN 325 MG PO TABS
650.0000 mg | ORAL_TABLET | Freq: Once | ORAL | Status: AC
  Administered 2022-02-14: 650 mg via ORAL
  Filled 2022-02-14: qty 2

## 2022-02-14 MED ORDER — SODIUM CHLORIDE 0.9 % IV SOLN
510.0000 mg | Freq: Once | INTRAVENOUS | Status: AC
  Administered 2022-02-14: 510 mg via INTRAVENOUS
  Filled 2022-02-14: qty 510

## 2022-02-14 MED ORDER — SODIUM CHLORIDE 0.9 % IV SOLN: Freq: Once | INTRAVENOUS | Status: AC

## 2022-02-14 NOTE — Patient Instructions (Signed)

## 2022-02-18 DIAGNOSIS — R300 Dysuria: Secondary | ICD-10-CM | POA: Diagnosis not present

## 2022-02-18 NOTE — Progress Notes (Unsigned)
[facility] Initial Visit:  Patient Care Team: [name], MD as PCP - General (Family Medicine) Minus Breeding, MD as PCP - Cardiology (Cardiology) Cameron Sprang, MD as Consulting Physician (Neurology)  CHIEF COMPLAINTS/PURPOSE OF CONSULTATION:  Oncology History   No history exists.    HISTORY OF PRESENTING ILLNESS: [name] 65 y.o. female is here because of  anemia Medical history notable for coronary artery disease, stage III chronic kidney disease, diabetes mellitus type 2, GERD with esophagitis, hyperlipidemia, peripheral vascular disease, TAVR placement, hypertension CHF (EF 35 to 40% post), CAD with CABG x4  April 21 2021:  Admitted to [facility]  for syncopal episode and hematochezia. In the ED she was noted to be and hypoxic requiring 2L O2, hemoglobin 8.4, and troponin 876. Hemoglobin decreased to 7.5 and required transfusion. She had maroon-colored stools.  Colonoscopy revealed  sigmoid diverticulosis, five subcentimeter colon polyps, and erythematous mucosa in sigmoid colon with biopsies showing patchy mild active colitis negative for features of chronicity    December 27, 2021 WBC 8.7 hemoglobin 8.3 MCV 83 platelet count 234 Hemoglobin A1c 9.6 December 31, 2021 BMP notable for glucose 221 creatinine 1.7  January 02 2022:  WBC 10.7 Hgb 8.5 PLT 173  January 22 2022:  [facility] Consult  Patient has been taking oral iron for about 20 yrs with GI intolerance of dyspepsia.  Has received PRBC's several times this year.  Has never received IV iron Fatigued and at times feels real cold.   Sometimes has ice pica.   Having dark stools tinged with blood.   Has not had an EGD. Patient is on ASA and Plavix because of PVD and CAD.    Social:  Married.  Former Marine scientist.  Tobacco none.  EtOH none  [place]:  No family members with bleeding disorders  WBC 5.1 hemoglobin 8.2 MCV 83 platelet count 174; 82 segs 11  lymphs 5 monos 2 eos reticulocyte count 1.6 Coombs test negative haptoglobin 267 SPEP with IEP showed no paraprotein.  Serum free kappa 72.3 lambda 31.7 with a kappa lambda 2.28 IgG 1019 IgA a 252 IgM 75 Ferritin 24 folate 8.4 B12 492 CMP notable for glucose of 344 creatinine 2.49 BUN 46 albumin 3.4  February 10 2022: Feraheme 510 mg IV   February 14 2022:  Feraheme 510 mg IV  February 19 2022:  Scheduled follow up.  Has experienced back pain following the infusions which were helped with Robaxan.  (Asked for a script gave one month supply) Energy level improved since receiving iron.  Less fatigued.     Review of Systems  Constitutional:  Positive for fatigue. Negative for appetite change, chills, fever and unexpected weight change.  HENT:   Positive for sore throat. Negative for mouth sores, nosebleeds and trouble swallowing.   Eyes:  Negative for eye problems and icterus.       Vision changes:  None  Respiratory:  Negative for chest tightness, cough, hemoptysis, shortness of breath and wheezing.        SOB resolved following heart surgery  Cardiovascular:  Positive for chest pain. Negative for palpitations.       Had a bout of chest pain today which lasted a few seconds  Gastrointestinal:  Positive for blood in stool and constipation. Negative for abdominal pain, diarrhea, nausea and vomiting.  Endocrine:       Cold intolerance:  none Heat intolerance:  none  Genitourinary:  Negative for bladder  incontinence, difficulty urinating, dysuria, frequency, hematuria and nocturia.   Musculoskeletal:  Positive for arthralgias, gait problem and myalgias. Negative for back pain.       Generalized arthralgias.  Has pain especially phantom pain  Skin:  Negative for itching, rash and wound.  Neurological:  Positive for extremity weakness, gait problem and headaches. Negative for dizziness, light-headedness and numbness.  Hematological:  Negative for adenopathy.       No bruising.  See HPI   Psychiatric/Behavioral:  Negative for suicidal ideas. [name] not nervous/anxious.     MEDICAL HISTORY: Past Medical History:  Diagnosis Date   Anemia, normocytic normochromic 03/14/2008   Arthritis    "hands"   CAD (coronary artery disease) of artery bypass graft 03/14/2008   Chronic kidney disease (CKD) 12/26/2015   stage 3 due to type 2 diabetes mellitus   Chronic pain syndrome 09/25/2015   Coronary artery disease involving native coronary artery without angina pectoris 09/25/2015   Essential hypertension 03/14/2008   Gastroesophageal reflux disease with esophagitis 07/09/2016   Generalized anxiety disorder with panic attacks    History of one panic attack   GERD (gastroesophageal reflux disease)    takes Zantac   Hyperlipidemia, unspecified 03/14/2008   Localization-related symptomatic epilepsy and epileptic syndromes with complex partial seizures, not intractable, without status epilepticus 06/27/2014   Major depressive disorder 03/14/2008   Neuropathy    Osteomyelitis of ankle or foot, left, acute 06/17/2013   Peripheral vascular disease    PVD (peripheral vascular disease) 03/25/2012   S/P TAVR (transcatheter aortic valve replacement) 12/31/2021   s/p TAVR with a 23 mm Edwards S3UR via the TF approach by Drs Burt Knack & Bartle   Type 2 diabetes mellitus with diabetic mononeuropathy, with long-term current use of insulin 03/14/2008    SURGICAL HISTORY: Past Surgical History:  Procedure Laterality Date   AMPUTATION Left 08/22/2013   Procedure: LEFT BELOW KNEE AMPUTATION;  Surgeon: Newt Minion, MD;  Location: [place];  Service: Orthopedics;  Laterality: Left;   ANKLE FUSION Left 05/11/2013   TIBIOCALCANEAL FUSION    ANKLE FUSION Left 05/12/2013   Procedure: LEFT TIBIOCALCANEAL FUSION;  Surgeon: Newt Minion, MD;  Location: [place];  Service: Orthopedics;  Laterality: Left;  Left Tibiocalcaneal Fusion   APPLICATION OF WOUND VAC  06/17/2013   Procedure: APPLICATION OF WOUND  VAC;  Surgeon: Newt Minion, MD;  Location: [place];  Service: Orthopedics;;   BELOW KNEE LEG AMPUTATION Right Jan. 2008   BUBBLE STUDY  07/31/2021   Procedure: BUBBLE STUDY;  Surgeon: Geralynn Rile, MD;  Location: [place];  Service: Cardiovascular;;   CAROTID ENDARTERECTOMY Right    CATARACT EXTRACTION Left 12/25/14   CHOLECYSTECTOMY     COLONOSCOPY     COLONOSCOPY N/A 04/24/2021   Procedure: COLONOSCOPY;  Surgeon: Toledo, Benay Pike, MD;  Location: [facility];  Service: Gastroenterology;  Laterality: N/A;   CORONARY ARTERY BYPASS GRAFT  12/01/2007   "CABG X4" (05/12/2013)   DILATION AND CURETTAGE OF UTERUS     I & D EXTREMITY Left 06/17/2013   Procedure: IRRIGATION AND DEBRIDEMENT EXTREMITY, PLACEMENT ANTIBIOTIC STIMULAN BEADS;  Surgeon: Newt Minion, MD;  Location: [place];  Service: Orthopedics;  Laterality: Left;   INTRAMEDULLARY (IM) NAIL INTERTROCHANTERIC Left 08/20/2018   Procedure: INTRAMEDULLARY (IM) NAIL INTERTROCHANTRIC;  Surgeon: Shona Needles, MD;  Location: [place];  Service: Orthopedics;  Laterality: Left;   INTRAOPERATIVE TRANSTHORACIC ECHOCARDIOGRAM N/A 12/31/2021   Procedure: INTRAOPERATIVE TRANSTHORACIC ECHOCARDIOGRAM;  Surgeon:  Sherren Mocha, MD;  Location: [place];  Service: Open Heart Surgery;  Laterality: N/A;   IRRIGATION AND DEBRIDEMENT ABSCESS Left 06/17/2013   ANKLE           DR [name]   RIGHT/LEFT HEART CATH AND CORONARY/GRAFT ANGIOGRAPHY N/A 10/07/2021   Procedure: RIGHT/LEFT HEART CATH AND CORONARY/GRAFT ANGIOGRAPHY;  Surgeon: Sherren Mocha, MD;  Location: [place] [facility];  Service: Cardiovascular;  Laterality: N/A;   TEE WITHOUT CARDIOVERSION N/A 07/31/2021   Procedure: TRANSESOPHAGEAL ECHOCARDIOGRAM (TEE);  Surgeon: Geralynn Rile, MD;  Location: [place];  Service: Cardiovascular;  Laterality: N/A;   TOE AMPUTATION Right    "took off a couple toes before the amputation"   TONSILLECTOMY     TRANSCATHETER AORTIC VALVE REPLACEMENT,  TRANSFEMORAL Right 12/31/2021   Procedure: Transcatheter Aortic Valve Replacement, Transfemoral;  Surgeon: Sherren Mocha, MD;  Location: [place];  Service: Open Heart Surgery;  Laterality: Right;    SOCIAL HISTORY: Social History   Socioeconomic History   Marital status: Married    Spouse name: Not on file   Number of children: 2   Years of education: 14   Highest education level: Associate degree: occupational, Hotel manager, or vocational program  Occupational History   Occupation: Disability  Tobacco Use   Smoking status: Never   Smokeless tobacco: Never  Vaping Use   Vaping Use: Never used  Substance and Sexual Activity   Alcohol use: No   Drug use: No   Sexual activity: Not Currently  Other Topics Concern   Not on file  Social History Narrative   Right handed    Lives in a one story home   Drinks caffeine    Social Determinants of Health   Financial Resource Strain: Not on file  Food Insecurity: No Food Insecurity (12/31/2021)   Hunger Vital Sign    Worried About Running Out of Food in the Last Year: Never true    Ran Out of Food in the Last Year: Never true  Transportation Needs: No Transportation Needs (12/31/2021)   PRAPARE - Hydrologist (Medical): No    Lack of Transportation (Non-Medical): No  Physical Activity: Not on file  Stress: Not on file  Social Connections: Not on file  Intimate Partner Violence: Not At Risk (12/31/2021)   Humiliation, Afraid, Rape, and Kick questionnaire    Fear of Current or Ex-Partner: No    Emotionally Abused: No    Physically Abused: No    Sexually Abused: No    FAMILY HISTORY Family History  Problem Relation Age of Onset   Cancer Mother    Heart disease Father    Diabetes Father    Hyperlipidemia Father    Hypertension Father    Alzheimer's disease Maternal Grandfather     ALLERGIES:  is allergic to metformin hcl, tape, ace inhibitors, actos [pioglitazone], atorvastatin, candesartan  cilexetil, gabapentin, influenza vaccines, isosorbide nitrate, keflex [cephalexin], quinapril hcl, and tramadol hcl.  MEDICATIONS:  Current Outpatient Medications  Medication Sig Dispense Refill   acetaminophen-codeine (TYLENOL #3) 300-30 MG tablet Take 1 tablet by mouth every 6 (six) hours as needed for severe pain.     albuterol (VENTOLIN HFA) 108 (90 Base) MCG/ACT inhaler Inhale 1-2 puffs into the lungs every 6 (six) hours as needed for wheezing or shortness of breath.      ALPRAZolam (XANAX) 0.5 MG tablet Take 0.5 mg by mouth 2 (two) times daily as needed for anxiety.     amoxicillin (AMOXIL) 500  MG tablet Take 4 tablets (2,000 mg total) by mouth as directed. 1 hour prior to dental work including cleanings 12 tablet 12   aspirin 81 MG tablet Take 1 tablet (81 mg total) by mouth daily. 90 tablet 0   carvedilol (COREG) 6.25 MG tablet Take 1 tablet (6.25 mg total) by mouth 2 (two) times daily. 180 tablet 3   clopidogrel (PLAVIX) 75 MG tablet Take 1 tablet (75 mg total) by mouth daily. 90 tablet 0   cycloSPORINE (RESTASIS) 0.05 % ophthalmic emulsion Place 1 drop into both eyes 2 (two) times daily as needed (dry eyes).     diclofenac Sodium (VOLTAREN) 1 % GEL Apply 1 application. topically 4 (four) times daily as needed (pain).     ezetimibe (ZETIA) 10 MG tablet Take 10 mg by mouth daily.     fluconazole (DIFLUCAN) 150 MG tablet Take 1 tablet by mouth daily as needed (For flair up).     HUMALOG KWIKPEN 100 UNIT/ML KwikPen Inject 12 Units into the skin 2 (two) times daily with a meal.     JARDIANCE 25 MG TABS tablet Take 25 mg by mouth daily.     LANTUS SOLOSTAR 100 UNIT/ML Solostar Pen Inject 38 Units into the skin at bedtime.     levETIRAcetam (KEPPRA XR) 750 MG 24 hr tablet Take 2 tablets (1,500 mg total) by mouth at bedtime. 180 tablet 3   lidocaine (LIDODERM) 5 % Place 1 patch onto the skin daily.     losartan (COZAAR) 50 MG tablet Take 50 mg by mouth daily.     Multiple Vitamins-Minerals  (MULTIVITAMINS THER. W/MINERALS) TABS Take 1 tablet by mouth daily.       mupirocin ointment (BACTROBAN) 2 % Apply 1 Application topically 3 (three) times daily.     nitroGLYCERIN (NITROSTAT) 0.4 MG SL tablet Place 1 tablet (0.4 mg total) under the tongue every 5 (five) minutes as needed for chest pain. 25 tablet 3   pregabalin (LYRICA) 50 MG capsule Take 1 capsule every night 90 capsule 2   rosuvastatin (CRESTOR) 40 MG tablet Take 40 mg by mouth daily.     sertraline (ZOLOFT) 100 MG tablet Take 1 tablet (100 mg total) by mouth 2 (two) times daily. 180 tablet 0   torsemide (DEMADEX) 20 MG tablet Take 1 tablet (20 mg total) by mouth daily. TAKE EXTRA TABLET AS NEEDED 90 tablet 3   No current facility-administered medications for this visit.    PHYSICAL EXAMINATION:  ECOG PERFORMANCE STATUS: 2 - Symptomatic, <50% confined to bed   There were no vitals filed for this visit.   There were no vitals filed for this visit.    Physical Exam Vitals and nursing note reviewed.  Constitutional:      Appearance: Normal appearance. She is not toxic-appearing or diaphoretic.     Comments: Here with son.  Seated in wheel chair.    HENT:     Head: Normocephalic and atraumatic.     Right Ear: External ear normal.     Left Ear: External ear normal.     Nose: Nose normal. No congestion or rhinorrhea.  Eyes:     General: No scleral icterus.    Extraocular Movements: Extraocular movements intact.     Conjunctiva/sclera: Conjunctivae normal.     Pupils: Pupils are equal, round, and reactive to light.  Cardiovascular:     Rate and Rhythm: Normal rate and regular rhythm.     Heart sounds: Murmur heard.  No friction rub. No gallop.  Pulmonary:     Effort: Pulmonary effort is normal. No respiratory distress.     Breath sounds: Normal breath sounds. No stridor. No wheezing or rhonchi.  Abdominal:     General: Bowel sounds are normal.     Palpations: Abdomen is soft.     Tenderness: There is no  abdominal tenderness. There is no guarding or rebound.  Musculoskeletal:        General: Deformity present. No swelling or tenderness.     Cervical back: Normal range of motion and neck supple. No rigidity or tenderness.     Comments: Has bilateral leg prostheses   Lymphadenopathy:     Head:     Right side of head: No submental, submandibular, tonsillar, preauricular, posterior auricular or occipital adenopathy.     Left side of head: No submental, submandibular, tonsillar, preauricular, posterior auricular or occipital adenopathy.     Cervical: No cervical adenopathy.     Right cervical: No superficial, deep or posterior cervical adenopathy.    Left cervical: No superficial, deep or posterior cervical adenopathy.     Upper Body:     Right upper body: No supraclavicular, axillary, pectoral or epitrochlear adenopathy.     Left upper body: No supraclavicular, axillary, pectoral or epitrochlear adenopathy.  Skin:    General: Skin is warm.     Coloration: Skin is pale. Skin is not jaundiced.     Findings: No erythema.  Neurological:     General: No focal deficit present.     Mental Status: She is alert and oriented to person, place, and time.     Cranial Nerves: No cranial nerve deficit.  Psychiatric:        Mood and Affect: Mood normal.        Behavior: Behavior normal.        Thought Content: Thought content normal.        Judgment: Judgment normal.    LABORATORY DATA: I have personally reviewed the data as listed:  Office Visit on 01/29/2022  Component Date Value Ref Range Status   Glucose 01/29/2022 256 (H)  70 - 99 mg/dL Final   BUN 01/29/2022 32 (H)  8 - 27 mg/dL Final   Creatinine, Ser 01/29/2022 1.79 (H)  0.57 - 1.00 mg/dL Final   eGFR 01/29/2022 31 (L)  >59 mL/min/1.73 Final   BUN/Creatinine Ratio 01/29/2022 18  12 - 28 Final   Sodium 01/29/2022 140  134 - 144 mmol/L Final   Potassium 01/29/2022 3.8  3.5 - 5.2 mmol/L Final   Chloride 01/29/2022 96  96 - 106 mmol/L  Final   CO2 01/29/2022 26  20 - 29 mmol/L Final   Calcium 01/29/2022 9.2  8.7 - 10.3 mg/dL Final  Appointment on 01/29/2022  Component Date Value Ref Range Status   Area-P 1/2 01/29/2022 3.74  cm2 Final   S' Lateral 01/29/2022 3.10  cm Final   AV Area mean vel 01/29/2022 0.94  cm2 Final   AR max vel 01/29/2022 0.85  cm2 Final   AV Area VTI 01/29/2022 0.81  cm2 Final   MV VTI 01/29/2022 0.81  cm2 Final   Ao pk vel 01/29/2022 2.33  m/s Final   AV Mean grad 01/29/2022 11.0  mmHg Final   AV Peak grad 01/29/2022 21.7  mmHg Final  Clinical Support on 01/22/2022  Component Date Value Ref Range Status   Haptoglobin 01/22/2022 267  37 - 355 mg/dL Final   Comment: (NOTE) Performed  At: [facility] [place], [place] [id number] Rush Farmer MD YP:[id number]    IgG (Immunoglobin G), Serum 01/22/2022 1,019  586 - 1,602 mg/dL Final   IgA 01/22/2022 252  87 - 352 mg/dL Final   IgM (Immunoglobulin M), Srm 01/22/2022 75  26 - 217 mg/dL Final   Total Protein ELP 01/22/2022 6.1  6.0 - 8.5 g/dL Corrected   Albumin SerPl Elph-Mcnc 01/22/2022 3.2  2.9 - 4.4 g/dL Corrected   Alpha 1 01/22/2022 0.3  0.0 - 0.4 g/dL Corrected   Alpha2 Glob SerPl Elph-Mcnc 01/22/2022 0.8  0.4 - 1.0 g/dL Corrected   B-Globulin SerPl Elph-Mcnc 01/22/2022 0.9  0.7 - 1.3 g/dL Corrected   Gamma Glob SerPl Elph-Mcnc 01/22/2022 0.9  0.4 - 1.8 g/dL Corrected   M Protein SerPl Elph-Mcnc 01/22/2022 Not Observed  Not Observed g/dL Corrected   Globulin, Total 01/22/2022 2.9  2.2 - 3.9 g/dL Corrected   Albumin/Glob SerPl 01/22/2022 1.2  0.7 - 1.7 Corrected   IFE 1 01/22/2022 Comment   Corrected   Comment: (NOTE) The immunofixation pattern appears unremarkable. Evidence of monoclonal protein is not apparent.    Please Note 01/22/2022 Comment   Corrected   Comment: (NOTE) Protein electrophoresis scan will follow via computer, mail, or courier delivery. Performed At: [facility] [place], [place] [id number] Rush Farmer MD AS:[id number]    Kappa free light chain 01/22/2022 72.3 (H)  3.3 - 19.4 mg/L Final   Lambda free light chains 01/22/2022 31.7 (H)  5.7 - 26.3 mg/L Final   Kappa, lambda light chain ratio 01/22/2022 2.28 (H)  0.26 - 1.65 Final   Comment: (NOTE) Performed At: [facility] [place], [place] [id number] Rush Farmer MD XB:[id number]    Retic Ct Pct 01/22/2022 1.6  0.4 - 3.1 % Final   RBC. 01/22/2022 3.38 (L)  3.87 - 5.11 MIL/uL Final   Retic Count, Absolute 01/22/2022 52.7  19.0 - 186.0 K/uL Final   Immature Retic Fract 01/22/2022 16.7 (H)  2.3 - 15.9 % Final   Performed at [facility], [place] [address], [place], [place] [address]   Vitamin B-12 01/22/2022 492  180 - 914 pg/mL Final   Comment: (NOTE) This assay is not validated for testing neonatal or myeloproliferative syndrome specimens for Vitamin B12 levels. Performed at [facility], [place] [address], [place], [place] [address]    Folate 01/22/2022 8.4  >5.9 ng/mL Final   Performed at [facility], [place] [address] [place], [place] [address]   Ferritin 01/22/2022 24  11 - 307 ng/mL Final   Performed at [facility] [address], [place], [place] [address]   DAT, complement 01/23/2022 NEG   Final   DAT, IgG 01/23/2022    Final                   Value:NEG Performed at [facility], [place] [address], [place], [place] [address]   Office Visit on 01/22/2022  Component Date Value Ref Range Status   WBC 01/22/2022 5.1  4.0 - 10.5 K/uL Final   RBC 01/22/2022 3.38 (L)  3.87 - 5.11 MIL/uL Final   Hemoglobin 01/22/2022 8.2 (L)  12.0 - 15.0 g/dL Final   HCT 01/22/2022 28.1 (L)  36.0 - 46.0 % Final   MCV 01/22/2022 83.1  80.0 - 100.0 fL Final   MCH 01/22/2022 24.3 (L)  26.0 - 34.0 pg Final  MCHC 01/22/2022 29.2 (L)  30.0 - 36.0 g/dL Final   RDW 01/22/2022 17.4 (H)  11.5 - 15.5 %  Final   Platelets 01/22/2022 174  150 - 400 K/uL Final   nRBC 01/22/2022 0.0  0.0 - 0.2 % Final   Neutrophils Relative % 01/22/2022 82  % Final   Neutro Abs 01/22/2022 4.2  1.7 - 7.7 K/uL Final   Lymphocytes Relative 01/22/2022 11  % Final   Lymphs Abs 01/22/2022 0.5 (L)  0.7 - 4.0 K/uL Final   Monocytes Relative 01/22/2022 5  % Final   Monocytes Absolute 01/22/2022 0.3  0.1 - 1.0 K/uL Final   Eosinophils Relative 01/22/2022 2  % Final   Eosinophils Absolute 01/22/2022 0.1  0.0 - 0.5 K/uL Final   Basophils Relative 01/22/2022 0  % Final   Basophils Absolute 01/22/2022 0.0  0.0 - 0.1 K/uL Final   Immature Granulocytes 01/22/2022 0  % Final   Abs Immature Granulocytes 01/22/2022 0.02  0.00 - 0.07 K/uL Final   Performed at [facility], [place] [address], [place], [place] [address]   Sodium 01/22/2022 138  135 - 145 mmol/L Final   Potassium 01/22/2022 4.5  3.5 - 5.1 mmol/L Final   Chloride 01/22/2022 102  98 - 111 mmol/L Final   CO2 01/22/2022 25  22 - 32 mmol/L Final   Glucose, Bld 01/22/2022 344 (H)  70 - 99 mg/dL Final   Glucose reference range applies only to samples taken after fasting for at least 8 hours.   BUN 01/22/2022 46 (H)  8 - 23 mg/dL Final   Creatinine, Ser 01/22/2022 2.29 (H)  0.44 - 1.00 mg/dL Final   Calcium 01/22/2022 8.4 (L)  8.9 - 10.3 mg/dL Final   Total Protein 01/22/2022 7.1  6.5 - 8.1 g/dL Final   Albumin 01/22/2022 3.4 (L)  3.5 - 5.0 g/dL Final   AST 01/22/2022 21  15 - 41 U/L Final   ALT 01/22/2022 14  0 - 44 U/L Final   Alkaline Phosphatase 01/22/2022 54  38 - 126 U/L Final   Total Bilirubin 01/22/2022 0.4  0.3 - 1.2 mg/dL Final   GFR, Estimated 01/22/2022 23 (L)  >60 mL/min Final   Comment: (NOTE) Calculated using the CKD-EPI Creatinine Equation (2021)    Anion gap 01/22/2022 11  5 - 15 Final   Performed at [facility], [place] [address] [place], [place] [address]    RADIOGRAPHIC STUDIES: I have personally reviewed  the radiological images as listed and agree with the findings in the report  No results found.  ASSESSMENT/PLAN 66 y.o. female with medical history notable for stage III chronic kidney disease, diabetes mellitus type 2, GERD with esophagitis, hyperlipidemia, peripheral vascular disease, TAVR placement, hypertension CHF (EF 35 to 40% post), CAD with CABG x4.  Patient seen for management of anemia.    Anemia:  Secondary to iron deficiency from GI blood loss exacerbated by ongoing need for antiplatelet therapy and CKD.    February 10 2022: Feraheme 510 mg IV   February 14 2022:  Feraheme 510 mg IV   Will obtain CBC with diff and ferritin today and in 1 month.  May need additional IV iron  Anemia in chronic kidney disease:  Iron is required for an adequate erythropoietic response to EPO.  Correction of iron deficiency allows lower usage of exogenous EPO.  Iron deficiency is also associated with impaired exercise performance, cognitive impairment or decreased quality of life, increased  risk in hospitalization or death in patients with CHF.  IV iron is beneficial in dialysis dependent CKD and nondialysis dependent CKD, as it is more efficacious in rising ferritin and Hb levels, while reducing ESA and transfusion requirements.  Therapeutics:  Since patient had symptomatic anemia with Hgb < 10  and didn't benefit from oral iron received IV iron.  Will follow Hgb and ferritin levels and if iron repletion does not rectify anemia then will write for ESA  Back pain: Likely multifactorial.  Musculoskeletal and possibly a minor rxn to IV iron.   Fatigue:  Multifactorial and improved after receiving IV iron.    DM Type II:  Brittle, with highs and lows.  Driving force behind kidney and cardiovascular disease.     Cancer Staging  No matching staging information was found for the patient.   No problem-specific Assessment & Plan notes found for this encounter.   No orders of the defined types were placed  in this encounter.   All questions were answered. The patient knows to call the clinic with any problems, questions or concerns.  This note was electronically signed.    Barbee Cough, MD  02/18/2022 5:01 PM

## 2022-02-19 ENCOUNTER — Inpatient Hospital Stay: Payer: Medicare Other | Admitting: Oncology

## 2022-02-19 ENCOUNTER — Other Ambulatory Visit: Payer: Self-pay | Admitting: Physician Assistant

## 2022-02-19 VITALS — BP 133/60 | HR 94 | Temp 98.4°F | Resp 16 | Ht 65.0 in | Wt 177.1 lb

## 2022-02-19 DIAGNOSIS — N1832 Chronic kidney disease, stage 3b: Secondary | ICD-10-CM | POA: Diagnosis not present

## 2022-02-19 DIAGNOSIS — D539 Nutritional anemia, unspecified: Secondary | ICD-10-CM

## 2022-02-19 DIAGNOSIS — Z7189 Other specified counseling: Secondary | ICD-10-CM | POA: Diagnosis not present

## 2022-02-19 DIAGNOSIS — Z794 Long term (current) use of insulin: Secondary | ICD-10-CM

## 2022-02-19 DIAGNOSIS — E1165 Type 2 diabetes mellitus with hyperglycemia: Secondary | ICD-10-CM

## 2022-02-19 DIAGNOSIS — D649 Anemia, unspecified: Secondary | ICD-10-CM

## 2022-02-19 MED ORDER — METHOCARBAMOL 500 MG PO TABS: 500.0000 mg | ORAL_TABLET | Freq: Three times a day (TID) | ORAL | 0 refills | Status: AC | PRN

## 2022-02-19 MED ORDER — FLUCONAZOLE 150 MG PO TABS: 150.0000 mg | ORAL_TABLET | Freq: Every day | ORAL | 6 refills | Status: DC | PRN

## 2022-02-20 ENCOUNTER — Other Ambulatory Visit: Payer: Self-pay | Admitting: Neurology

## 2022-02-25 ENCOUNTER — Encounter: Payer: Self-pay | Admitting: Neurology

## 2022-02-27 DIAGNOSIS — E1142 Type 2 diabetes mellitus with diabetic polyneuropathy: Secondary | ICD-10-CM | POA: Diagnosis not present

## 2022-02-28 DIAGNOSIS — L8990 Pressure ulcer of unspecified site, unspecified stage: Secondary | ICD-10-CM | POA: Diagnosis not present

## 2022-02-28 DIAGNOSIS — E1142 Type 2 diabetes mellitus with diabetic polyneuropathy: Secondary | ICD-10-CM | POA: Diagnosis not present

## 2022-03-04 ENCOUNTER — Encounter: Payer: Self-pay | Admitting: Physician Assistant

## 2022-03-04 ENCOUNTER — Ambulatory Visit (INDEPENDENT_AMBULATORY_CARE_PROVIDER_SITE_OTHER): Payer: Medicare Other | Admitting: Physician Assistant

## 2022-03-04 VITALS — BP 162/77 | HR 86 | Resp 18 | Ht 65.0 in | Wt 177.0 lb

## 2022-03-04 DIAGNOSIS — R519 Headache, unspecified: Secondary | ICD-10-CM | POA: Diagnosis not present

## 2022-03-04 NOTE — Patient Instructions (Signed)
  Follow up in 1 month MRI brain  Limit use of pain relievers to no more than 2 days out of the week.  These medications include acetaminophen, NSAIDs (ibuprofen/Advil/Motrin, naproxen/Aleve, triptans (Imitrex/sumatriptan), Excedrin, and narcotics.  This will help reduce risk of rebound headaches. Be aware of common food triggers:  - Caffeine:  coffee, black tea, cola, Mt. Dew  - Chocolate  - Dairy:  aged cheeses (brie, blue, cheddar, gouda, [place], provolone, [place], Swiss, etc), chocolate milk, buttermilk, sour cream, limit eggs and yogurt  - Nuts, peanut butter  - Alcohol  - Cereals/grains:  FRESH breads (fresh bagels, sourdough, doughnuts), yeast productions  - Processed/canned/aged/cured meats (pre-packaged deli meats, hotdogs)  - MSG/glutamate:  soy sauce, flavor enhancer, pickled/preserved/marinated foods  - Sweeteners:  aspartame (Equal, Nutrasweet).  Sugar and Splenda are okay  - Vegetables:  legumes (lima beans, lentils, snow peas, fava beans, pinto peans, peas, garbanzo beans), sauerkraut, onions, olives, pickles  - Fruit:  avocados, bananas, citrus fruit (orange, lemon, grapefruit), mango  - Other:  Frozen meals, macaroni and cheese Routine exercise Stay adequately hydrated (aim for 64 oz water daily) Keep headache diary Maintain proper stress management Maintain proper sleep hygiene Do not skip meals Consider supplements:  magnesium citrate '400mg'$  daily, riboflavin '400mg'$  daily, coenzyme Q10 '100mg'$  three times daily.

## 2022-03-04 NOTE — Progress Notes (Signed)
NEUROLOGY CONSULTATION NOTE  [name] MRN: [id number] DOB: 06/14/1955  Referring provider: Ellouise Newer, MD  Primary care provider: Melinda Crutch, MD  Reason for consult:  Headaches  Assessment/Plan:   66 year old right-handed woman with a history of diabetes, hypertension, hyperlipidemia, history of depression, PVD status post right CEA, CAD, CKD stage IIIb, iron deficiency anemia and anemia of chronic disease (has a referral to hematology for iron infusions), status post CABG, chronic systolic heart failure EF 35 to 40%, mitral valve disease and severe AS status post TAVR (12/30/2021) status post bilateral BKA, nocturnal convulsion in 2009, presenting today for evaluation of headaches.  In today's visit, she does report of not drinking enough water (only 1 glass a day), she has significant anemia, recently, being treated at the cancer center with iron infusion, and she was noted to be taking not on any NSAIDs on a frequent basis, as well as pain medications for other medical issues as well as headaches, does admit to caffeine intake and has a history of elevated blood pressure among other contributors to her headaches.  Recommendations   schedule MRI of the brain without contrast for evaluation of headaches, structure of the brain and vascular load Will hold any medical management of her headaches until MRI is performed, as this is limited given her significant cardiac history as well as elevated blood pressure.  Limit use of pain relievers to no more than 2 days out of week to prevent risk of rebound or medication-overuse headache.  Keep headache diary  Exercise, hydration, caffeine cessation, sleep hygiene, monitor for and avoid triggers  Consider:  magnesium citrate '400mg'$  daily, riboflavin '400mg'$  daily, and coenzyme Q10 '100mg'$  three times daily. Increase activity level Increase hydration Continue to control mood as per PCP, recommend better management of stress. Follow up in 1  month  History of seizures After left TKA on 08/22/2013, the patient had a seizure on 08/25/2013.  MRI of the brain showed a lesion of the left frontal and parietal white matter, concerning for vasogenic edema.  LP was unremarkable, including cytology.  Repeat MRI of the brain 2 months later, showed complete resolution of abnormal signal.  Considerations at that time included cerebritis, venous thrombosis without infarction.  She was noted to have hypertensive emergency with the first seizure, although asymmetric and more frontal atypical to PRES was another consideration in view of complete resolution on MRI.  The patient was last seen on 01/07/2022 by Dr. Delice Foster in follow-up, at which time she was seizure-free since June 2015 on levetiracetam ER 1500 mg nightly.  She is on Lyrica 50 mg nightly for phantom limb pain, and takes it as needed with excellent response.    For seizures, follow with Dr. Delice Foster in 1 year. Continue Keppra ER 1500 mg nightly  Subjective:    Onset: on and off for about 1 month Quality: Dull  Intensity: 4-5 /10 Location: L parietal side one month ago,  then  2 weeks ago went to the R parietal area , comes and goes Duration: 2-3 mins Frequency: 3 times a day  Associated symptoms:  dizzy spells when picking up something from the floor , no vertigo,  Aura: lately some flashing lights once or twice  Activity: No  Aggravating factors: Stress over the last month over the holidays Relieving factors: lying down, sleeping  Current abortive medications:  Excedrin 1 tab a day,  Current prophylactic medications: tylenol 3 at night   Past abortive medications: None Past prophylactic medications: None  Frequency of medications:   Family history: no Smoker: no  Alcohol: no  Caffeine: 2 a day (root beer) Sleep: "I am sleeping too much during the day for 1 month because I am tired". Water: "Not enough, 1 glass a day "  Pertinent labs 01/22/2022 for ferritin 24, B12 492, Took Iron  infusion x 2     Initial visit, Dr. Yvonna Foster is a 66 yo RH woman with a history of diabetes, hypertension, hyperlipidemia, peripheral vascular disease s/p right carotid endarterectomy, CAD s/p CABG, and right BKA secondary to diabetes. She was admitted for left foot osteomyelitis due to poor healing wound after a fall last winter. She underwent left transtibial amputation on 08/22/13. She was discharged home on 06/11, then started having altered mental status on 06/15. Her son reports she had hit her head in the left frontal region when getting into their car, then started acting drowsy and out of it the next day. That evening, they were getting ready to eat when her right arm flexed and clenched into her chest followed by head turn to the left,stiffening and shaking for 1 minute as she fell to her left side. She was confused for 10 minutes, back to baseline within 30 minutes. She was brought to [facility] ER where BP was noted to be 205/87. She was given IV labetalol with slow improvement in BP. Blood sugar noted to be 303. Head CT showed low attenuation in the left corona radiata and centrum semiovale. She had an MRI brain initially without contrast which showed increased T2 and FLAIR signal in the left hemisphere from the anterior frontal cortex, supplemental motor area, extending down to the posterior limb of the internal capsule. She became agitated and needed sedation for MRI brain with contrast, however again became restless with motion degradation, no abnormal enhancement seen. Left ICA flow void was noted to be absent, similar since 2009. It was felt that symptoms were not consistent with left MCA ischemia and concern was for viral CNS infection and received 4 days of empiric IV Acyclovir. She underwent a lumbar puncture which showed WBC 1, RBC 17, glucose 82, protein 29, VDRL, CMV, HSV, arbovirus, gram stain and culture negative. No malignant cells on cytology. She was started on Keppra with no further  seizures, discharged home on 06/18. She was apparently intermittently confused at home, woke up better then next day, then had another seizure that morning. Her son today reports she had a generalized convulsion with head deviation to left for 5 minutes. On arrival to the ER, per records, she was poorly responsive with right gaze deviation, nystagmoid eye movements. BP 197/80 She was given IV Ativan, according to her son she remained in this state for an hour, then was unresponsive for 36 hours after. Prolonged EEG showed diffuse slowing, no electrographic seizures or epileptiform discharges. Repeat MRI brain with and without contrast was again degraded by motion. Similar abnormal signal in the left frontal and parietal lobes again noted, predominantly in the centrum semiovale extending into the subcortical white matter. There is an area of questionable thickening in the left frontal cortex noted to be unchanged, no effacement of sulci in this area, no restricted diffusion, no abnormal enhancement although study is suboptimal. Chronically occluded left ICA again noted. She was discharged home on Keppra XR '1500mg'$ /day. Repeat MRI 2 months later showed complete resolution of abnormal signal in the left frontal and parietal regions. There was mild chronic small vessel disease bilaterally. Per report, findings could have been  due to cerebritis or conceivably due to venous thrombosis without infarction.    Of note, in 2009, she had a nocturnal convulsion and was admitted to [facility]. Records unavailable for review, according to her son she was not started on seizure medication due to first seizure. She had an MRI brain at that time which I personally reviewed, it was noted to be limited by motion and patient refusing to complete study, however on FLAIR sequences, white matter changes in the left hemisphere are not present.    Epilepsy Risk Factors: Left frontal white matter lesion that has resolved. Otherwise she had a  normal birth and early development. There is no history of febrile convulsions, significant traumatic brain injury, neurosurgical procedures, or family history of seizures.    Diagnostic Data: Neuropsychological testing done in June 2021 was largely within expected normative ranges. There were relative weaknesses across certain aspects of executive functioning, as well as some performance variability across several domains, but scores within normal limits. Etiology of cognitive changes possibly multifactorial, but overall no indication of a neurodegenerative illness    PAST MEDICAL HISTORY: Past Medical History:  Diagnosis Date   Anemia, normocytic normochromic 03/14/2008   Arthritis    "hands"   CAD (coronary artery disease) of artery bypass graft 03/14/2008   Chronic kidney disease (CKD) 12/26/2015   stage 3 due to type 2 diabetes mellitus   Chronic pain syndrome 09/25/2015   Coronary artery disease involving native coronary artery without angina pectoris 09/25/2015   Essential hypertension 03/14/2008   Gastroesophageal reflux disease with esophagitis 07/09/2016   Generalized anxiety disorder with panic attacks    History of one panic attack   GERD (gastroesophageal reflux disease)    takes Zantac   Hyperlipidemia, unspecified 03/14/2008   Localization-related symptomatic epilepsy and epileptic syndromes with complex partial seizures, not intractable, without status epilepticus 06/27/2014   Major depressive disorder 03/14/2008   Neuropathy    Osteomyelitis of ankle or foot, left, acute 06/17/2013   Peripheral vascular disease    PVD (peripheral vascular disease) 03/25/2012   S/P TAVR (transcatheter aortic valve replacement) 12/31/2021   s/p TAVR with a 23 mm Edwards S3UR via the TF approach by Drs Burt Knack & Bartle   Type 2 diabetes mellitus with diabetic mononeuropathy, with long-term current use of insulin 03/14/2008    PAST SURGICAL HISTORY: Past Surgical History:  Procedure  Laterality Date   AMPUTATION Left 08/22/2013   Procedure: LEFT BELOW KNEE AMPUTATION;  Surgeon: Newt Minion, MD;  Location: [place];  Service: Orthopedics;  Laterality: Left;   ANKLE FUSION Left 05/11/2013   TIBIOCALCANEAL FUSION    ANKLE FUSION Left 05/12/2013   Procedure: LEFT TIBIOCALCANEAL FUSION;  Surgeon: Newt Minion, MD;  Location: [place];  Service: Orthopedics;  Laterality: Left;  Left Tibiocalcaneal Fusion   APPLICATION OF WOUND VAC  06/17/2013   Procedure: APPLICATION OF WOUND VAC;  Surgeon: Newt Minion, MD;  Location: [place];  Service: Orthopedics;;   BELOW KNEE LEG AMPUTATION Right Jan. 2008   BUBBLE STUDY  07/31/2021   Procedure: BUBBLE STUDY;  Surgeon: Geralynn Rile, MD;  Location: [place];  Service: Cardiovascular;;   CAROTID ENDARTERECTOMY Right    CATARACT EXTRACTION Left 12/25/14   CHOLECYSTECTOMY     COLONOSCOPY     COLONOSCOPY N/A 04/24/2021   Procedure: COLONOSCOPY;  Surgeon: Tol[place], Benay Pike, MD;  Location: [facility];  Service: Gastroenterology;  Laterality: N/A;   CORONARY ARTERY BYPASS GRAFT  12/01/2007   "CABG  X4" (05/12/2013)   DILATION AND CURETTAGE OF UTERUS     I & D EXTREMITY Left 06/17/2013   Procedure: IRRIGATION AND DEBRIDEMENT EXTREMITY, PLACEMENT ANTIBIOTIC STIMULAN BEADS;  Surgeon: Newt Minion, MD;  Location: [place];  Service: Orthopedics;  Laterality: Left;   INTRAMEDULLARY (IM) NAIL INTERTROCHANTERIC Left 08/20/2018   Procedure: INTRAMEDULLARY (IM) NAIL INTERTROCHANTRIC;  Surgeon: Shona Needles, MD;  Location: [place];  Service: Orthopedics;  Laterality: Left;   INTRAOPERATIVE TRANSTHORACIC ECHOCARDIOGRAM N/A 12/31/2021   Procedure: INTRAOPERATIVE TRANSTHORACIC ECHOCARDIOGRAM;  Surgeon: Sherren Mocha, MD;  Location: [place];  Service: Open Heart Surgery;  Laterality: N/A;   IRRIGATION AND DEBRIDEMENT ABSCESS Left 06/17/2013   ANKLE           DR [name]   RIGHT/LEFT HEART CATH AND CORONARY/GRAFT ANGIOGRAPHY N/A 10/07/2021   Procedure: RIGHT/LEFT  HEART CATH AND CORONARY/GRAFT ANGIOGRAPHY;  Surgeon: Sherren Mocha, MD;  Location: [place] [facility];  Service: Cardiovascular;  Laterality: N/A;   TEE WITHOUT CARDIOVERSION N/A 07/31/2021   Procedure: TRANSESOPHAGEAL ECHOCARDIOGRAM (TEE);  Surgeon: Geralynn Rile, MD;  Location: [place];  Service: Cardiovascular;  Laterality: N/A;   TOE AMPUTATION Right    "took off a couple toes before the amputation"   TONSILLECTOMY     TRANSCATHETER AORTIC VALVE REPLACEMENT, TRANSFEMORAL Right 12/31/2021   Procedure: Transcatheter Aortic Valve Replacement, Transfemoral;  Surgeon: Sherren Mocha, MD;  Location: [place];  Service: Open Heart Surgery;  Laterality: Right;    MEDICATIONS: Current Outpatient Medications on File Prior to Visit  Medication Sig Dispense Refill   acetaminophen-codeine (TYLENOL #3) 300-30 MG tablet Take 1 tablet by mouth every 6 (six) hours as needed for severe pain.     albuterol (VENTOLIN HFA) 108 (90 Base) MCG/ACT inhaler Inhale 1-2 puffs into the lungs every 6 (six) hours as needed for wheezing or shortness of breath.      ALPRAZolam (XANAX) 0.5 MG tablet Take 0.5 mg by mouth 2 (two) times daily as needed for anxiety.     amoxicillin (AMOXIL) 500 MG tablet Take 4 tablets (2,000 mg total) by mouth as directed. 1 hour prior to dental work including cleanings 12 tablet 12   aspirin 81 MG tablet Take 1 tablet (81 mg total) by mouth daily. 90 tablet 0   carvedilol (COREG) 6.25 MG tablet Take 1 tablet (6.25 mg total) by mouth 2 (two) times daily. 180 tablet 3   clopidogrel (PLAVIX) 75 MG tablet Take 1 tablet (75 mg total) by mouth daily. 90 tablet 0   cycloSPORINE (RESTASIS) 0.05 % ophthalmic emulsion Place 1 drop into both eyes 2 (two) times daily as needed (dry eyes).     diclofenac Sodium (VOLTAREN) 1 % GEL Apply 1 application. topically 4 (four) times daily as needed (pain).     ezetimibe (ZETIA) 10 MG tablet Take 10 mg by mouth daily.     fluconazole (DIFLUCAN) 150 MG  tablet Take 1 tablet (150 mg total) by mouth daily as needed (For flair up). 3 tablet 6   HUMALOG KWIKPEN 100 UNIT/ML KwikPen Inject 12 Units into the skin 2 (two) times daily with a meal.     JARDIANCE 25 MG TABS tablet Take 25 mg by mouth daily.     LANTUS SOLOSTAR 100 UNIT/ML Solostar Pen Inject 38 Units into the skin at bedtime.     levETIRAcetam (KEPPRA XR) 750 MG 24 hr tablet TAKE 2 TABLETS (1,500 MG TOTAL) BY MOUTH AT BEDTIME. 60 tablet 1   lidocaine (LIDODERM) 5 %  Place 1 patch onto the skin daily.     losartan (COZAAR) 50 MG tablet Take 50 mg by mouth daily.     methocarbamol (ROBAXIN) 500 MG tablet Take 1 tablet (500 mg total) by mouth every 8 (eight) hours as needed for up to 40 doses for muscle spasms. 40 tablet 0   Multiple Vitamins-Minerals (MULTIVITAMINS THER. W/MINERALS) TABS Take 1 tablet by mouth daily.       mupirocin ointment (BACTROBAN) 2 % Apply 1 Application topically 3 (three) times daily.     nitroGLYCERIN (NITROSTAT) 0.4 MG SL tablet Place 1 tablet (0.4 mg total) under the tongue every 5 (five) minutes as needed for chest pain. 25 tablet 3   pregabalin (LYRICA) 50 MG capsule Take 1 capsule every night 90 capsule 2   rosuvastatin (CRESTOR) 40 MG tablet Take 40 mg by mouth daily.     sertraline (ZOLOFT) 100 MG tablet Take 1 tablet (100 mg total) by mouth 2 (two) times daily. 180 tablet 0   torsemide (DEMADEX) 20 MG tablet Take 1 tablet (20 mg total) by mouth daily. TAKE EXTRA TABLET AS NEEDED 90 tablet 3   No current facility-administered medications on file prior to visit.    ALLERGIES: Allergies  Allergen Reactions   Metformin Hcl Nausea And Vomiting   Tape Rash    Redness, rash, itchiness    Ace Inhibitors Cough   Actos [Pioglitazone] Swelling   Atorvastatin Other (See Comments)    myalgias   Candesartan Cilexetil Other (See Comments)    Rash "cant recall"    Gabapentin Other (See Comments)    Dizziness and unbalanced   Influenza Vaccines     nearly  killed me   Isosorbide Nitrate Other (See Comments)    HA.   Keflex [Cephalexin]      stomach upset   Quinapril Hcl Cough   Tramadol Hcl Other (See Comments)    seziure    FAMILY HISTORY: Family History  Problem Relation Age of Onset   Cancer Mother    Heart disease Father    Diabetes Father    Hyperlipidemia Father    Hypertension Father    Alzheimer's disease Maternal Grandfather    .  Objective:   General: No acute distress.  Patient appears well-groomed.   Head:  Normocephalic/atraumatic Eyes:  fundi examined but not visualized Neck: supple, no paraspinal tenderness, full range of motion Back: No paraspinal tenderness Heart: regular rate and rhythm Lungs: Clear to auscultation bilaterally. Vascular: No carotid bruits. Neurological Exam: Mental status: alert and oriented to person, place, and time, recent and remote memory intact, fund of knowledge intact, attention and concentration intact, speech fluent and not dysarthric, language intact. Cranial nerves: CN I: not tested CN II: pupils equal, round and reactive to light, visual fields intact CN III, IV, VI:  full range of motion, no nystagmus, no ptosis CN V: facial sensation intact. CN VII: upper and lower face symmetric CN VIII: hearing intact CN IX, X: gag intact, uvula midline CN XI: sternocleidomastoid and trapezius muscles intact CN XII: tongue midline Bulk & Tone: normal, no fasciculations. Motor:  muscle strength 5/5 throughout, patient is has bilateral BKA. Sensation:  Pinprick, temperature and vibratory sensation intact.  Bilateral BKA Deep Tendon Reflexes:  2+ throughout. Finger to nose testing:  Without dysmetria.   Heel to shin:  Without dysmetria.   Gait:  Normal station, bilateral BKA     Thank you for allowing me to take part in the care of this  patient.

## 2022-03-15 ENCOUNTER — Other Ambulatory Visit: Payer: Self-pay | Admitting: Neurology

## 2022-03-18 ENCOUNTER — Telehealth: Payer: Self-pay | Admitting: Cardiology

## 2022-03-18 DIAGNOSIS — I7025 Atherosclerosis of native arteries of other extremities with ulceration: Secondary | ICD-10-CM

## 2022-03-18 DIAGNOSIS — I739 Peripheral vascular disease, unspecified: Secondary | ICD-10-CM

## 2022-03-18 DIAGNOSIS — I25810 Atherosclerosis of coronary artery bypass graft(s) without angina pectoris: Secondary | ICD-10-CM

## 2022-03-18 NOTE — Telephone Encounter (Signed)
*  STAT* If patient is at the pharmacy, call can be transferred to refill team.   1. Which medications need to be refilled? (please list name of each medication and dose if known)   clopidogrel (PLAVIX) 75 MG tablet   2. Which pharmacy/location (including street and city if local pharmacy) is medication to be sent to?  [facility] [place], [place]. ([address]   3. Do they need a 30 day or 90 day supply?   90 day  Patient stated she is completely out of this medication.

## 2022-03-19 ENCOUNTER — Encounter: Payer: Self-pay | Admitting: Oncology

## 2022-03-19 ENCOUNTER — Inpatient Hospital Stay: Payer: Medicare HMO | Attending: Oncology

## 2022-03-19 DIAGNOSIS — I130 Hypertensive heart and chronic kidney disease with heart failure and stage 1 through stage 4 chronic kidney disease, or unspecified chronic kidney disease: Secondary | ICD-10-CM | POA: Insufficient documentation

## 2022-03-19 DIAGNOSIS — E1122 Type 2 diabetes mellitus with diabetic chronic kidney disease: Secondary | ICD-10-CM | POA: Diagnosis not present

## 2022-03-19 DIAGNOSIS — D500 Iron deficiency anemia secondary to blood loss (chronic): Secondary | ICD-10-CM | POA: Diagnosis not present

## 2022-03-19 DIAGNOSIS — I509 Heart failure, unspecified: Secondary | ICD-10-CM | POA: Diagnosis not present

## 2022-03-19 DIAGNOSIS — Z992 Dependence on renal dialysis: Secondary | ICD-10-CM | POA: Diagnosis not present

## 2022-03-19 DIAGNOSIS — N183 Chronic kidney disease, stage 3 unspecified: Secondary | ICD-10-CM | POA: Insufficient documentation

## 2022-03-19 DIAGNOSIS — D539 Nutritional anemia, unspecified: Secondary | ICD-10-CM

## 2022-03-19 LAB — CBC WITH DIFFERENTIAL/PLATELET
Abs Immature Granulocytes: 0.01 10*3/uL (ref 0.00–0.07)
Basophils Absolute: 0 10*3/uL (ref 0.0–0.1)
Basophils Relative: 0 %
Eosinophils Absolute: 0.2 10*3/uL (ref 0.0–0.5)
Eosinophils Relative: 4 %
HCT: 32.4 % — ABNORMAL LOW (ref 36.0–46.0)
Hemoglobin: 10.4 g/dL — ABNORMAL LOW (ref 12.0–15.0)
Immature Granulocytes: 0 %
Lymphocytes Relative: 23 %
Lymphs Abs: 0.9 10*3/uL (ref 0.7–4.0)
MCH: 27.2 pg (ref 26.0–34.0)
MCHC: 32.1 g/dL (ref 30.0–36.0)
MCV: 84.6 fL (ref 80.0–100.0)
Monocytes Absolute: 0.3 10*3/uL (ref 0.1–1.0)
Monocytes Relative: 8 %
Neutro Abs: 2.7 10*3/uL (ref 1.7–7.7)
Neutrophils Relative %: 65 %
Platelets: 169 10*3/uL (ref 150–400)
RBC: 3.83 MIL/uL — ABNORMAL LOW (ref 3.87–5.11)
RDW: 19 % — ABNORMAL HIGH (ref 11.5–15.5)
WBC: 4.2 10*3/uL (ref 4.0–10.5)
nRBC: 0 % (ref 0.0–0.2)

## 2022-03-19 LAB — FERRITIN: Ferritin: 313 ng/mL — ABNORMAL HIGH (ref 11–307)

## 2022-03-19 MED ORDER — CLOPIDOGREL BISULFATE 75 MG PO TABS: 75.0000 mg | ORAL_TABLET | Freq: Every day | ORAL | 3 refills | Status: DC

## 2022-03-21 ENCOUNTER — Telehealth: Payer: Self-pay | Admitting: Anesthesiology

## 2022-03-21 NOTE — Telephone Encounter (Signed)
Pt called stating she has an MRI appointment on 04/02/2022 and she would like Clarise Cruz to prescribe her something for her claustrophobia to help her with her anxiety when she has her MRI. Rx can be sent to [facility] in [facility], [phone].

## 2022-03-24 ENCOUNTER — Other Ambulatory Visit: Payer: Self-pay | Admitting: Physician Assistant

## 2022-03-24 MED ORDER — DIAZEPAM 2 MG PO TABS: ORAL_TABLET | ORAL | 0 refills | Status: DC

## 2022-03-26 ENCOUNTER — Inpatient Hospital Stay (INDEPENDENT_AMBULATORY_CARE_PROVIDER_SITE_OTHER): Payer: Medicare HMO | Admitting: Oncology

## 2022-03-26 VITALS — BP 164/70 | HR 94 | Temp 98.5°F | Resp 16 | Ht 65.0 in | Wt 176.0 lb

## 2022-03-26 DIAGNOSIS — D539 Nutritional anemia, unspecified: Secondary | ICD-10-CM

## 2022-03-26 DIAGNOSIS — N1832 Chronic kidney disease, stage 3b: Secondary | ICD-10-CM | POA: Diagnosis not present

## 2022-03-26 DIAGNOSIS — E1165 Type 2 diabetes mellitus with hyperglycemia: Secondary | ICD-10-CM

## 2022-03-26 DIAGNOSIS — Z794 Long term (current) use of insulin: Secondary | ICD-10-CM | POA: Diagnosis not present

## 2022-03-26 NOTE — Progress Notes (Signed)
[facility] Follow up Visit:  Patient Care Team: [name], MD as PCP - General (Family Medicine) Minus Breeding, MD as PCP - Cardiology (Cardiology) Cameron Sprang, MD as Consulting Physician (Neurology)  CHIEF COMPLAINTS/PURPOSE OF CONSULTATION:  Oncology History   No history exists.    HISTORY OF PRESENTING ILLNESS: [name] 67 y.o. female is here because of  anemia Medical history notable for coronary artery disease, stage III chronic kidney disease, diabetes mellitus type 2, GERD with esophagitis, hyperlipidemia, peripheral vascular disease, TAVR placement, hypertension CHF (EF 35 to 40% post), CAD with CABG x4  April 21 2021:  Admitted to [facility]  for syncopal episode and hematochezia. In the ED she was noted to be and hypoxic requiring 2L O2, hemoglobin 8.4, and troponin 876. Hemoglobin decreased to 7.5 and required transfusion. She had maroon-colored stools.  Colonoscopy revealed  sigmoid diverticulosis, five subcentimeter colon polyps, and erythematous mucosa in sigmoid colon with biopsies showing patchy mild active colitis negative for features of chronicity    December 27, 2021 WBC 8.7 hemoglobin 8.3 MCV 83 platelet count 234 Hemoglobin A1c 9.6 December 31, 2021 BMP notable for glucose 221 creatinine 1.7  January 02 2022:  WBC 10.7 Hgb 8.5 PLT 173  January 22 2022:  [facility] Consult  Patient has been taking oral iron for about 20 yrs with GI intolerance of dyspepsia.  Has received PRBC's several times this year.  Has never received IV iron Fatigued and at times feels real cold.   Sometimes has ice pica.   Having dark stools tinged with blood.   Has not had an EGD. Patient is on ASA and Plavix because of PVD and CAD.    Social:  Married.  Former Marine scientist.  Tobacco none.  EtOH none  [place]:  No family members with bleeding disorders  WBC 5.1 hemoglobin 8.2 MCV 83 platelet count 174; 82 segs 11  lymphs 5 monos 2 eos reticulocyte count 1.6 Coombs test negative haptoglobin 267 SPEP with IEP showed no paraprotein.  Serum free kappa 72.3 lambda 31.7 with a kappa lambda 2.28 IgG 1019 IgA a 252 IgM 75 Ferritin 24 folate 8.4 B12 492 CMP notable for glucose of 344 creatinine 2.49 BUN 46 albumin 3.4  February 10 2022: Feraheme 510 mg IV   February 14 2022:  Feraheme 510 mg IV  February 19 2022:  Scheduled follow up.  Has experienced back pain following the infusions which were helped with Robaxan.  (Asked for a script gave one month supply) Energy level improved since receiving iron.  Less fatigued.    March 19 2021:  WBC 4.2 Hgb 10.4 PLT 169.  Ferritin 313  March 26 2022:  Scheduled follow up for anemia and malabsorption.  Feels pretty good.  Back pain has improved considerably.  Not taking any oral iron Instructed patient to begin Flintstone's multivitamin with iron, one tablet daily   Review of Systems  Constitutional:  Positive for fatigue. Negative for appetite change, chills, fever and unexpected weight change.  HENT:   Positive for sore throat. Negative for mouth sores, nosebleeds and trouble swallowing.   Eyes:  Negative for eye problems and icterus.       Vision changes:  None  Respiratory:  Negative for chest tightness, cough, hemoptysis, shortness of breath and wheezing.        SOB resolved following heart surgery  Cardiovascular:  Positive for chest pain. Negative for palpitations.  Had a bout of chest pain today which lasted a few seconds  Gastrointestinal:  Positive for blood in stool and constipation. Negative for abdominal pain, diarrhea, nausea and vomiting.  Endocrine:       Cold intolerance:  none Heat intolerance:  none  Genitourinary:  Negative for bladder incontinence, difficulty urinating, dysuria, frequency, hematuria and nocturia.   Musculoskeletal:  Positive for arthralgias, gait problem and myalgias. Negative for back pain.       Generalized  arthralgias.  Has pain especially phantom pain  Skin:  Negative for itching, rash and wound.  Neurological:  Positive for extremity weakness, gait problem and headaches. Negative for dizziness, light-headedness and numbness.  Hematological:  Negative for adenopathy.       No bruising.  See HPI  Psychiatric/Behavioral:  Negative for suicidal ideas. [name] not nervous/anxious.     MEDICAL HISTORY: Past Medical History:  Diagnosis Date   Anemia, normocytic normochromic 03/14/2008   Arthritis    "hands"   CAD (coronary artery disease) of artery bypass graft 03/14/2008   Chronic kidney disease (CKD) 12/26/2015   stage 3 due to type 2 diabetes mellitus   Chronic pain syndrome 09/25/2015   Coronary artery disease involving native coronary artery without angina pectoris 09/25/2015   Essential hypertension 03/14/2008   Gastroesophageal reflux disease with esophagitis 07/09/2016   Generalized anxiety disorder with panic attacks    History of one panic attack   GERD (gastroesophageal reflux disease)    takes Zantac   Hyperlipidemia, unspecified 03/14/2008   Localization-related symptomatic epilepsy and epileptic syndromes with complex partial seizures, not intractable, without status epilepticus 06/27/2014   Major depressive disorder 03/14/2008   Neuropathy    Osteomyelitis of ankle or foot, left, acute 06/17/2013   Peripheral vascular disease    PVD (peripheral vascular disease) 03/25/2012   S/P TAVR (transcatheter aortic valve replacement) 12/31/2021   s/p TAVR with a 23 mm Edwards S3UR via the TF approach by Drs Burt Knack & Bartle   Type 2 diabetes mellitus with diabetic mononeuropathy, with long-term current use of insulin 03/14/2008    SURGICAL HISTORY: Past Surgical History:  Procedure Laterality Date   AMPUTATION Left 08/22/2013   Procedure: LEFT BELOW KNEE AMPUTATION;  Surgeon: Newt Minion, MD;  Location: [place];  Service: Orthopedics;  Laterality: Left;   ANKLE FUSION Left  05/11/2013   TIBIOCALCANEAL FUSION    ANKLE FUSION Left 05/12/2013   Procedure: LEFT TIBIOCALCANEAL FUSION;  Surgeon: Newt Minion, MD;  Location: [place];  Service: Orthopedics;  Laterality: Left;  Left Tibiocalcaneal Fusion   APPLICATION OF WOUND VAC  06/17/2013   Procedure: APPLICATION OF WOUND VAC;  Surgeon: Newt Minion, MD;  Location: [place];  Service: Orthopedics;;   BELOW KNEE LEG AMPUTATION Right Jan. 2008   BUBBLE STUDY  07/31/2021   Procedure: BUBBLE STUDY;  Surgeon: Geralynn Rile, MD;  Location: [place];  Service: Cardiovascular;;   CAROTID ENDARTERECTOMY Right    CATARACT EXTRACTION Left 12/25/14   CHOLECYSTECTOMY     COLONOSCOPY     COLONOSCOPY N/A 04/24/2021   Procedure: COLONOSCOPY;  Surgeon: Toledo, Benay Pike, MD;  Location: [facility];  Service: Gastroenterology;  Laterality: N/A;   CORONARY ARTERY BYPASS GRAFT  12/01/2007   "CABG X4" (05/12/2013)   DILATION AND CURETTAGE OF UTERUS     I & D EXTREMITY Left 06/17/2013   Procedure: IRRIGATION AND DEBRIDEMENT EXTREMITY, PLACEMENT ANTIBIOTIC STIMULAN BEADS;  Surgeon: Newt Minion, MD;  Location: [place];  Service: Orthopedics;  Laterality: Left;   INTRAMEDULLARY (IM) NAIL INTERTROCHANTERIC Left 08/20/2018   Procedure: INTRAMEDULLARY (IM) NAIL INTERTROCHANTRIC;  Surgeon: Shona Needles, MD;  Location: [place];  Service: Orthopedics;  Laterality: Left;   INTRAOPERATIVE TRANSTHORACIC ECHOCARDIOGRAM N/A 12/31/2021   Procedure: INTRAOPERATIVE TRANSTHORACIC ECHOCARDIOGRAM;  Surgeon: Sherren Mocha, MD;  Location: [place];  Service: Open Heart Surgery;  Laterality: N/A;   IRRIGATION AND DEBRIDEMENT ABSCESS Left 06/17/2013   ANKLE           DR [name]   RIGHT/LEFT HEART CATH AND CORONARY/GRAFT ANGIOGRAPHY N/A 10/07/2021   Procedure: RIGHT/LEFT HEART CATH AND CORONARY/GRAFT ANGIOGRAPHY;  Surgeon: Sherren Mocha, MD;  Location: [place] [facility];  Service: Cardiovascular;  Laterality: N/A;   TEE WITHOUT CARDIOVERSION N/A 07/31/2021    Procedure: TRANSESOPHAGEAL ECHOCARDIOGRAM (TEE);  Surgeon: Geralynn Rile, MD;  Location: [place];  Service: Cardiovascular;  Laterality: N/A;   TOE AMPUTATION Right    "took off a couple toes before the amputation"   TONSILLECTOMY     TRANSCATHETER AORTIC VALVE REPLACEMENT, TRANSFEMORAL Right 12/31/2021   Procedure: Transcatheter Aortic Valve Replacement, Transfemoral;  Surgeon: Sherren Mocha, MD;  Location: [place];  Service: Open Heart Surgery;  Laterality: Right;    SOCIAL HISTORY: Social History   Socioeconomic History   Marital status: Married    Spouse name: Not on file   Number of children: 2   Years of education: 14   Highest education level: Associate degree: occupational, Hotel manager, or vocational program  Occupational History   Occupation: Disability  Tobacco Use   Smoking status: Never   Smokeless tobacco: Never  Vaping Use   Vaping Use: Never used  Substance and Sexual Activity   Alcohol use: No   Drug use: No   Sexual activity: Not Currently  Other Topics Concern   Not on file  Social History Narrative   Right handed    Lives in a one story home   Drinks caffeine    Retired   2 sons Aeronautical engineer and chance   Social Determinants of Health   Financial Resource Strain: Not on file  Food Insecurity: No Food Insecurity (12/31/2021)   Hunger Vital Sign    Worried About Running Out of Food in the Last Year: Never true    [place] in the Last Year: Never true  Transportation Needs: No Transportation Needs (12/31/2021)   PRAPARE - Hydrologist (Medical): No    Lack of Transportation (Non-Medical): No  Physical Activity: Not on file  Stress: Not on file  Social Connections: Not on file  Intimate Partner Violence: Not At Risk (12/31/2021)   Humiliation, Afraid, Rape, and Kick questionnaire    Fear of Current or Ex-Partner: No    Emotionally Abused: No    Physically Abused: No    Sexually Abused: No    FAMILY  HISTORY Family History  Problem Relation Age of Onset   Cancer Mother    Heart disease Father    Diabetes Father    Hyperlipidemia Father    Hypertension Father    Alzheimer's disease Maternal Grandfather     ALLERGIES:  is allergic to metformin hcl, tape, ace inhibitors, actos [pioglitazone], atorvastatin, candesartan cilexetil, gabapentin, influenza vaccines, isosorbide nitrate, keflex [cephalexin], quinapril hcl, and tramadol hcl.  MEDICATIONS:  Current Outpatient Medications  Medication Sig Dispense Refill   acetaminophen-codeine (TYLENOL #3) 300-30 MG tablet Take 1 tablet by mouth every 6 (six) hours as needed for severe  pain.     albuterol (VENTOLIN HFA) 108 (90 Base) MCG/ACT inhaler Inhale 1-2 puffs into the lungs every 6 (six) hours as needed for wheezing or shortness of breath.      ALPRAZolam (XANAX) 0.5 MG tablet Take 0.5 mg by mouth 2 (two) times daily as needed for anxiety.     amoxicillin (AMOXIL) 500 MG tablet Take 4 tablets (2,000 mg total) by mouth as directed. 1 hour prior to dental work including cleanings 12 tablet 12   aspirin 81 MG tablet Take 1 tablet (81 mg total) by mouth daily. 90 tablet 0   carvedilol (COREG) 6.25 MG tablet Take 1 tablet (6.25 mg total) by mouth 2 (two) times daily. 180 tablet 3   clopidogrel (PLAVIX) 75 MG tablet Take 1 tablet (75 mg total) by mouth daily. 90 tablet 3   cycloSPORINE (RESTASIS) 0.05 % ophthalmic emulsion Place 1 drop into both eyes 2 (two) times daily as needed (dry eyes).     diazepam (VALIUM) 2 MG tablet Take one tab 30 mins before the MRI, may add another dose prior to it if needed. 2 tablet 0   diclofenac Sodium (VOLTAREN) 1 % GEL Apply 1 application. topically 4 (four) times daily as needed (pain).     ezetimibe (ZETIA) 10 MG tablet Take 10 mg by mouth daily.     fluconazole (DIFLUCAN) 150 MG tablet Take 1 tablet (150 mg total) by mouth daily as needed (For flair up). 3 tablet 6   HUMALOG KWIKPEN 100 UNIT/ML KwikPen Inject  12 Units into the skin 2 (two) times daily with a meal.     JARDIANCE 25 MG TABS tablet Take 25 mg by mouth daily.     LANTUS SOLOSTAR 100 UNIT/ML Solostar Pen Inject 38 Units into the skin at bedtime.     levETIRAcetam (KEPPRA XR) 750 MG 24 hr tablet TAKE 2 TABLETS (1,500 MG TOTAL) BY MOUTH AT BEDTIME. 60 tablet 1   lidocaine (LIDODERM) 5 % Place 1 patch onto the skin daily.     losartan (COZAAR) 50 MG tablet Take 50 mg by mouth daily.     methocarbamol (ROBAXIN) 500 MG tablet Take 1 tablet (500 mg total) by mouth every 8 (eight) hours as needed for up to 40 doses for muscle spasms. 40 tablet 0   Multiple Vitamins-Minerals (MULTIVITAMINS THER. W/MINERALS) TABS Take 1 tablet by mouth daily.       mupirocin ointment (BACTROBAN) 2 % Apply 1 Application topically 3 (three) times daily.     nitroGLYCERIN (NITROSTAT) 0.4 MG SL tablet Place 1 tablet (0.4 mg total) under the tongue every 5 (five) minutes as needed for chest pain. 25 tablet 3   pregabalin (LYRICA) 50 MG capsule Take 1 capsule every night 90 capsule 2   rosuvastatin (CRESTOR) 40 MG tablet Take 40 mg by mouth daily.     sertraline (ZOLOFT) 100 MG tablet Take 1 tablet (100 mg total) by mouth 2 (two) times daily. 180 tablet 0   torsemide (DEMADEX) 20 MG tablet Take 1 tablet (20 mg total) by mouth daily. TAKE EXTRA TABLET AS NEEDED 90 tablet 3   No current facility-administered medications for this visit.    PHYSICAL EXAMINATION:  ECOG PERFORMANCE STATUS: 2 - Symptomatic, <50% confined to bed   There were no vitals filed for this visit.   There were no vitals filed for this visit.    Physical Exam Vitals and nursing note reviewed.  Constitutional:      Appearance: Normal appearance.  She is not toxic-appearing or diaphoretic.     Comments: Here with husband.  Seated in wheel chair.    HENT:     Head: Normocephalic and atraumatic.     Right Ear: External ear normal.     Left Ear: External ear normal.     Nose: Nose normal. No  congestion or rhinorrhea.  Eyes:     General: No scleral icterus.    Extraocular Movements: Extraocular movements intact.     Conjunctiva/sclera: Conjunctivae normal.     Pupils: Pupils are equal, round, and reactive to light.  Cardiovascular:     Rate and Rhythm: Normal rate and regular rhythm.     Heart sounds: Murmur heard.     No friction rub. No gallop.  Pulmonary:     Effort: Pulmonary effort is normal. No respiratory distress.     Breath sounds: Normal breath sounds. No stridor. No wheezing or rhonchi.  Abdominal:     General: Bowel sounds are normal.     Palpations: Abdomen is soft.     Tenderness: There is no abdominal tenderness. There is no guarding or rebound.  Musculoskeletal:        General: Deformity present. No swelling or tenderness.     Cervical back: Normal range of motion and neck supple. No rigidity or tenderness.     Comments: Has bilateral leg prostheses   Lymphadenopathy:     Head:     Right side of head: No submental, submandibular, tonsillar, preauricular, posterior auricular or occipital adenopathy.     Left side of head: No submental, submandibular, tonsillar, preauricular, posterior auricular or occipital adenopathy.     Cervical: No cervical adenopathy.     Right cervical: No superficial, deep or posterior cervical adenopathy.    Left cervical: No superficial, deep or posterior cervical adenopathy.     Upper Body:     Right upper body: No supraclavicular, axillary, pectoral or epitrochlear adenopathy.     Left upper body: No supraclavicular, axillary, pectoral or epitrochlear adenopathy.  Skin:    General: Skin is warm.     Coloration: Skin is pale. Skin is not jaundiced.     Findings: No erythema.  Neurological:     General: No focal deficit present.     Mental Status: She is alert and oriented to person, place, and time.     Cranial Nerves: No cranial nerve deficit.  Psychiatric:        Mood and Affect: Mood normal.        Behavior: Behavior  normal.        Thought Content: Thought content normal.        Judgment: Judgment normal.     LABORATORY DATA: I have personally reviewed the data as listed:  Appointment on 03/19/2022  Component Date Value Ref Range Status   Ferritin 03/19/2022 313 (H)  11 - 307 ng/mL Final   Performed at [facility], [place] [address], [place], [place] [address]   WBC 03/19/2022 4.2  4.0 - 10.5 K/uL Final   RBC 03/19/2022 3.83 (L)  3.87 - 5.11 MIL/uL Final   Hemoglobin 03/19/2022 10.4 (L)  12.0 - 15.0 g/dL Final   HCT 03/19/2022 32.4 (L)  36.0 - 46.0 % Final   MCV 03/19/2022 84.6  80.0 - 100.0 fL Final   MCH 03/19/2022 27.2  26.0 - 34.0 pg Final   MCHC 03/19/2022 32.1  30.0 - 36.0 g/dL Final   RDW 03/19/2022 19.0 (H)  11.5 -  15.5 % Final   Platelets 03/19/2022 169  150 - 400 K/uL Final   nRBC 03/19/2022 0.0  0.0 - 0.2 % Final   Neutrophils Relative % 03/19/2022 65  % Final   Neutro Abs 03/19/2022 2.7  1.7 - 7.7 K/uL Final   Lymphocytes Relative 03/19/2022 23  % Final   Lymphs Abs 03/19/2022 0.9  0.7 - 4.0 K/uL Final   Monocytes Relative 03/19/2022 8  % Final   Monocytes Absolute 03/19/2022 0.3  0.1 - 1.0 K/uL Final   Eosinophils Relative 03/19/2022 4  % Final   Eosinophils Absolute 03/19/2022 0.2  0.0 - 0.5 K/uL Final   Basophils Relative 03/19/2022 0  % Final   Basophils Absolute 03/19/2022 0.0  0.0 - 0.1 K/uL Final   Immature Granulocytes 03/19/2022 0  % Final   Abs Immature Granulocytes 03/19/2022 0.01  0.00 - 0.07 K/uL Final   Performed at [facility], [place] [address] [place], [place] [address]    RADIOGRAPHIC STUDIES: I have personally reviewed the radiological images as listed and agree with the findings in the report  No results found.  ASSESSMENT/PLAN 67 y.o. female with medical history notable for stage III chronic kidney disease, diabetes mellitus type 2, GERD with esophagitis, hyperlipidemia, peripheral vascular disease, TAVR placement,  hypertension CHF (EF 35 to 40% post), CAD with CABG x4.  Patient seen for management of anemia.    Anemia:  Secondary to iron deficiency from GI blood loss exacerbated by ongoing need for antiplatelet therapy and CKD.    February 10 2022: Feraheme 510 mg IV   February 14 2022:  Feraheme 510 mg IV  March 19 2022:  Hgb 10.4 Ferritin 313   Anemia in chronic kidney disease:  Iron is required for an adequate erythropoietic response to EPO.  Correction of iron deficiency allows lower usage of exogenous EPO.  Iron deficiency is also associated with impaired exercise performance, cognitive impairment or decreased quality of life, increased risk in hospitalization or death in patients with CHF.  IV iron is beneficial in dialysis dependent CKD and nondialysis dependent CKD, as it is more efficacious in rising ferritin and Hb levels, while reducing ESA and transfusion requirements. Given improvement of Hgb to >10.0 g/dL will hold on starting an ESA at this time   Back pain: Likely multifactorial.  Musculoskeletal and possibly a minor rxn to IV iron.   Fatigue:  Multifactorial and improved after receiving IV iron.    DM Type II:  Brittle, with highs and lows.  Driving force behind kidney and cardiovascular disease.     Cancer Staging  No matching staging information was found for the patient.   No problem-specific Assessment & Plan notes found for this encounter.   No orders of the defined types were placed in this encounter.  22  minutes was spent in patient care.  This included time spent preparing to see the patient (e.g., review of tests), obtaining and/or reviewing separately obtained history, counseling and educating the patient/family/caregiver, ordering medications, tests,documenting clinical information in the electronic or other health record, independently interpreting results and communicating results to the patient/family/caregiver as well as coordination of care.      All questions were  answered. The patient knows to call the clinic with any problems, questions or concerns.  This note was electronically signed.    Barbee Cough, MD  03/26/2022 8:26 AM

## 2022-03-26 NOTE — Patient Instructions (Signed)
Please begin Flintstone's multivitamin with iron, one tablet daily

## 2022-03-30 DIAGNOSIS — E1142 Type 2 diabetes mellitus with diabetic polyneuropathy: Secondary | ICD-10-CM | POA: Diagnosis not present

## 2022-04-01 ENCOUNTER — Encounter: Payer: Self-pay | Admitting: Oncology

## 2022-04-02 ENCOUNTER — Ambulatory Visit
Admission: RE | Admit: 2022-04-02 | Discharge: 2022-04-02 | Disposition: A | Discharge status: Home / Self Care | Payer: Medicare HMO | Source: Ambulatory Visit | Attending: Physician Assistant | Admitting: Physician Assistant

## 2022-04-02 DIAGNOSIS — R519 Headache, unspecified: Secondary | ICD-10-CM | POA: Diagnosis not present

## 2022-04-03 ENCOUNTER — Encounter: Payer: Self-pay | Admitting: Oncology

## 2022-04-03 DIAGNOSIS — H52223 Regular astigmatism, bilateral: Secondary | ICD-10-CM | POA: Diagnosis not present

## 2022-04-03 DIAGNOSIS — H18423 Band keratopathy, bilateral: Secondary | ICD-10-CM | POA: Diagnosis not present

## 2022-04-03 DIAGNOSIS — Z0100 Encounter for examination of eyes and vision without abnormal findings: Secondary | ICD-10-CM | POA: Diagnosis not present

## 2022-04-03 DIAGNOSIS — H1711 Central corneal opacity, right eye: Secondary | ICD-10-CM | POA: Diagnosis not present

## 2022-04-03 DIAGNOSIS — E119 Type 2 diabetes mellitus without complications: Secondary | ICD-10-CM | POA: Diagnosis not present

## 2022-04-04 ENCOUNTER — Ambulatory Visit: Payer: Medicare Other | Admitting: Physician Assistant

## 2022-04-08 ENCOUNTER — Telehealth (INDEPENDENT_AMBULATORY_CARE_PROVIDER_SITE_OTHER): Payer: Medicare HMO | Admitting: Physician Assistant

## 2022-04-08 ENCOUNTER — Encounter: Payer: Self-pay | Admitting: Physician Assistant

## 2022-04-08 VITALS — Ht 65.0 in

## 2022-04-08 DIAGNOSIS — R519 Headache, unspecified: Secondary | ICD-10-CM | POA: Insufficient documentation

## 2022-04-08 DIAGNOSIS — G40209 Localization-related (focal) (partial) symptomatic epilepsy and epileptic syndromes with complex partial seizures, not intractable, without status epilepticus: Secondary | ICD-10-CM | POA: Diagnosis not present

## 2022-04-08 NOTE — Progress Notes (Signed)
Virtual Visit via Video Note The purpose of this virtual visit is to provide medical care in a patient that is unable to be seen in person due to physical or health limitations   Consent was obtained for video visit:  yes  Answered questions that patient had about telehealth interaction:  yes I discussed the limitations, risks, security and privacy concerns of performing an evaluation and management service by telemedicine. I also discussed with the patient that there may be a patient responsible charge related to this service. The patient expressed understanding and agreed to proceed.  Pt location: Home Physician Location: office Name of referring provider:  [name], MD I connected with [name] at patients initiation/request on 04/08/2022 at 10:00 AM EST by video enabled telemedicine application and verified that I am speaking with the correct person using two identifiers. Pt MRN:  [id number] Pt DOB:  [birth year] Video Participants:  [name];     Assessment and Plan:    67 year old right-handed woman with a history of diabetes, hypertension, hyperlipidemia, history of depression, PVD status post right CEA, CAD, CKD stage IIIb, iron deficiency anemia and anemia of chronic disease (has a referral to hematology for iron infusions), status post CABG, chronic systolic heart failure EF 35 to 40%, mitral valve disease and severe AS status post TAVR (12/30/2021) status post bilateral BKA, nocturnal convulsion in 2009,  seen today via virtual visit in follow up for headaches. She continues to drink only  1 glass of water a day, she has significant anemia, now being treated at the [facility] with iron infusion, history of elevated blood pressure especially when stressed (she reports that her son is causing her BP to go up and then headaches appear). All these factors may contribute to her headaches. She is trying to make some lifestyle modifications, such as having  discontinued Exedrin-although continues to take daily tylenol, and consuming less caffeine. She also does not mobilize much, and she reports some mood changes that circle around her family, which she is to discuss with  PCP this week. In the past, magnesium citrate '400mg'$  daily, riboflavin '400mg'$  daily, and coenzyme Q10 '100mg'$  three times daily had been recommended but she did not take. At this time, no headache agent is still indicated until she reports all the above lifestyle changes better managed, especially since no acute findings have ben noted on MRI brain.    Recommendations    Limit use of pain relievers to no more than 2 days out of week to prevent risk of rebound or medication-overuse headache.  Keep headache diary  Exercise, hydration, caffeine cessation, sleep hygiene, monitor for and avoid triggers  Consider:  magnesium citrate '400mg'$  daily, riboflavin '400mg'$  daily, and coenzyme Q10 '100mg'$  three times daily. Increase activity level Increase hydration Continue to control mood as per PCP, recommend better management of stress. Consider CBP/Psychotherapy  Patient has an appt in 07/2022, will keep that appointment   History of seizures  After left TKA on 08/22/2013, the patient had a seizure on 08/25/2013.  MRI of the brain showed a lesion of the left frontal and parietal white matter, concerning for vasogenic edema.  LP was unremarkable, including cytology.  Repeat MRI of the brain 2 months later, showed complete resolution of abnormal signal.  Considerations at that time included cerebritis, venous thrombosis without infarction.  She was noted to have hypertensive emergency with the first seizure, although asymmetric and more frontal atypical to PRES was another consideration  in view of complete resolution on MRI.  The patient was last seen on 01/07/2022 by Dr. Delice Lesch in follow-up, at which time she was seizure-free since June 2015 on levetiracetam ER 1500 mg nightly.  She is on Lyrica 50 mg nightly  for phantom limb pain, and takes it as needed with excellent response.    For seizures, follow with Dr. Delice Lesch . Continue Keppra ER 1500 mg nightly   History of Present Illness:     Onset: on and off  Quality: Dull  Intensity: 4  /10 Location: B parietal area  comes and goes Duration: 2-3 mins Frequency: 3 times a week ( was 3 times a day )  Associated symptoms:   occasional dizzy spells when picking up something from the floor , no vertigo,  Aura: occasional flashing lights once or twice  Activity: No  Aggravating factors:"Stress due to my sone. He agitates me and then my BP goes up and my headache comes"-she says  Relieving factors: lying down, sleeping. ( She goes to sleep at 2 am t  Current abortive medications:  Excedrin 1 tab a day,  Current prophylactic medications: tylenol 3 at night   Past abortive medications: None Past prophylactic medications: None  Frequency of medications:   Family history: no Smoker: no  Alcohol: no  Caffeine: 2 a day (root beer) Sleep: "I am sleeping too much during the day for 1 month because I am tired". Water: "Not enough, 1 glass a day "  Pertinent labs 01/22/2022 for ferritin 24, B12 492, Took Iron infusion x 2     Initial visit, Dr. Yvonna Alanis is a 67 yo RH woman with a history of diabetes, hypertension, hyperlipidemia, peripheral vascular disease s/p right carotid endarterectomy, CAD s/p CABG, and right BKA secondary to diabetes. She was admitted for left foot osteomyelitis due to poor healing wound after a fall last winter. She underwent left transtibial amputation on 08/22/13. She was discharged home on 06/11, then started having altered mental status on 06/15. Her son reports she had hit her head in the left frontal region when getting into their car, then started acting drowsy and out of it the next day. That evening, they were getting ready to eat when her right arm flexed and clenched into her chest followed by head turn to the  left,stiffening and shaking for 1 minute as she fell to her left side. She was confused for 10 minutes, back to baseline within 30 minutes. She was brought to [facility] ER where BP was noted to be 205/87. She was given IV labetalol with slow improvement in BP. Blood sugar noted to be 303. Head CT showed low attenuation in the left corona radiata and centrum semiovale. She had an MRI brain initially without contrast which showed increased T2 and FLAIR signal in the left hemisphere from the anterior frontal cortex, supplemental motor area, extending down to the posterior limb of the internal capsule. She became agitated and needed sedation for MRI brain with contrast, however again became restless with motion degradation, no abnormal enhancement seen. Left ICA flow void was noted to be absent, similar since 2009. It was felt that symptoms were not consistent with left MCA ischemia and concern was for viral CNS infection and received 4 days of empiric IV Acyclovir. She underwent a lumbar puncture which showed WBC 1, RBC 17, glucose 82, protein 29, VDRL, CMV, HSV, arbovirus, gram stain and culture negative. No malignant cells on cytology. She was started on Keppra with no further  seizures, discharged home on 06/18. She was apparently intermittently confused at home, woke up better then next day, then had another seizure that morning. Her son today reports she had a generalized convulsion with head deviation to left for 5 minutes. On arrival to the ER, per records, she was poorly responsive with right gaze deviation, nystagmoid eye movements. BP 197/80 She was given IV Ativan, according to her son she remained in this state for an hour, then was unresponsive for 36 hours after. Prolonged EEG showed diffuse slowing, no electrographic seizures or epileptiform discharges. Repeat MRI brain with and without contrast was again degraded by motion. Similar abnormal signal in the left frontal and parietal lobes again noted,  predominantly in the centrum semiovale extending into the subcortical white matter. There is an area of questionable thickening in the left frontal cortex noted to be unchanged, no effacement of sulci in this area, no restricted diffusion, no abnormal enhancement although study is suboptimal. Chronically occluded left ICA again noted. She was discharged home on Keppra XR '1500mg'$ /day. Repeat MRI 2 months later showed complete resolution of abnormal signal in the left frontal and parietal regions. There was mild chronic small vessel disease bilaterally. Per report, findings could have been due to cerebritis or conceivably due to venous thrombosis without infarction.    Of note, in 2009, she had a nocturnal convulsion and was admitted to [facility]. Records unavailable for review, according to her son she was not started on seizure medication due to first seizure. She had an MRI brain at that time which I personally reviewed, it was noted to be limited by motion and patient refusing to complete study, however on FLAIR sequences, white matter changes in the left hemisphere are not present.    Epilepsy Risk Factors: Left frontal white matter lesion that has resolved. Otherwise she had a normal birth and early development. There is no history of febrile convulsions, significant traumatic brain injury, neurosurgical procedures, or family history of seizures.    Diagnostic Data: Neuropsychological testing done in June 2021 was largely within expected normative ranges. There were relative weaknesses across certain aspects of executive functioning, as well as some performance variability across several domains, but scores within normal limits. Etiology of cognitive changes possibly multifactorial, but overall no indication of a neurodegenerative illness          Current Outpatient Medications on File Prior to Visit  Medication Sig Dispense Refill   acetaminophen-codeine (TYLENOL #3) 300-30 MG tablet Take 1 tablet by  mouth every 6 (six) hours as needed for severe pain.     albuterol (VENTOLIN HFA) 108 (90 Base) MCG/ACT inhaler Inhale 1-2 puffs into the lungs every 6 (six) hours as needed for wheezing or shortness of breath.      ALPRAZolam (XANAX) 0.5 MG tablet Take 0.5 mg by mouth 2 (two) times daily as needed for anxiety.     amoxicillin (AMOXIL) 500 MG tablet Take 4 tablets (2,000 mg total) by mouth as directed. 1 hour prior to dental work including cleanings 12 tablet 12   aspirin 81 MG tablet Take 1 tablet (81 mg total) by mouth daily. 90 tablet 0   carvedilol (COREG) 6.25 MG tablet Take 1 tablet (6.25 mg total) by mouth 2 (two) times daily. 180 tablet 3   clopidogrel (PLAVIX) 75 MG tablet Take 1 tablet (75 mg total) by mouth daily. 90 tablet 3   cycloSPORINE (RESTASIS) 0.05 % ophthalmic emulsion Place 1 drop into both eyes 2 (two) times daily as  needed (dry eyes).     diazepam (VALIUM) 2 MG tablet Take one tab 30 mins before the MRI, may add another dose prior to it if needed. 2 tablet 0   diclofenac Sodium (VOLTAREN) 1 % GEL Apply 1 application. topically 4 (four) times daily as needed (pain).     ezetimibe (ZETIA) 10 MG tablet Take 10 mg by mouth daily.     fluconazole (DIFLUCAN) 150 MG tablet Take 1 tablet (150 mg total) by mouth daily as needed (For flair up). 3 tablet 6   HUMALOG KWIKPEN 100 UNIT/ML KwikPen Inject 12 Units into the skin 2 (two) times daily with a meal.     JARDIANCE 25 MG TABS tablet Take 25 mg by mouth daily.     LANTUS SOLOSTAR 100 UNIT/ML Solostar Pen Inject 38 Units into the skin at bedtime.     levETIRAcetam (KEPPRA XR) 750 MG 24 hr tablet TAKE 2 TABLETS (1,500 MG TOTAL) BY MOUTH AT BEDTIME. 60 tablet 1   lidocaine (LIDODERM) 5 % Place 1 patch onto the skin daily.     losartan (COZAAR) 50 MG tablet Take 50 mg by mouth daily.     methocarbamol (ROBAXIN) 500 MG tablet Take 1 tablet (500 mg total) by mouth every 8 (eight) hours as needed for up to 40 doses for muscle spasms. 40  tablet 0   Multiple Vitamins-Minerals (MULTIVITAMINS THER. W/MINERALS) TABS Take 1 tablet by mouth daily.       mupirocin ointment (BACTROBAN) 2 % Apply 1 Application topically 3 (three) times daily.     nitroGLYCERIN (NITROSTAT) 0.4 MG SL tablet Place 1 tablet (0.4 mg total) under the tongue every 5 (five) minutes as needed for chest pain. 25 tablet 3   pregabalin (LYRICA) 50 MG capsule Take 1 capsule every night 90 capsule 2   rosuvastatin (CRESTOR) 40 MG tablet Take 40 mg by mouth daily.     sertraline (ZOLOFT) 100 MG tablet Take 1 tablet (100 mg total) by mouth 2 (two) times daily. 180 tablet 0   torsemide (DEMADEX) 20 MG tablet Take 1 tablet (20 mg total) by mouth daily. TAKE EXTRA TABLET AS NEEDED 90 tablet 3   No current facility-administered medications on file prior to visit.   Past Medical History:  Diagnosis Date   Anemia, normocytic normochromic 03/14/2008   Arthritis    "hands"   CAD (coronary artery disease) of artery bypass graft 03/14/2008   Chronic kidney disease (CKD) 12/26/2015   stage 3 due to type 2 diabetes mellitus   Chronic pain syndrome 09/25/2015   Coronary artery disease involving native coronary artery without angina pectoris 09/25/2015   Essential hypertension 03/14/2008   Gastroesophageal reflux disease with esophagitis 07/09/2016   Generalized anxiety disorder with panic attacks    History of one panic attack   GERD (gastroesophageal reflux disease)    takes Zantac   Hyperlipidemia, unspecified 03/14/2008   Localization-related symptomatic epilepsy and epileptic syndromes with complex partial seizures, not intractable, without status epilepticus 06/27/2014   Major depressive disorder 03/14/2008   Neuropathy    Osteomyelitis of ankle or foot, left, acute 06/17/2013   Peripheral vascular disease    PVD (peripheral vascular disease) 03/25/2012   S/P TAVR (transcatheter aortic valve replacement) 12/31/2021   s/p TAVR with a 23 mm Edwards S3UR via the TF  approach by Drs Burt Knack & Bartle   Type 2 diabetes mellitus with diabetic mononeuropathy, with long-term current use of insulin 03/14/2008   Past Surgical History:  Procedure  Laterality Date   AMPUTATION Left 08/22/2013   Procedure: LEFT BELOW KNEE AMPUTATION;  Surgeon: Newt Minion, MD;  Location: [place];  Service: Orthopedics;  Laterality: Left;   ANKLE FUSION Left 05/11/2013   TIBIOCALCANEAL FUSION    ANKLE FUSION Left 05/12/2013   Procedure: LEFT TIBIOCALCANEAL FUSION;  Surgeon: Newt Minion, MD;  Location: [place];  Service: Orthopedics;  Laterality: Left;  Left Tibiocalcaneal Fusion   APPLICATION OF WOUND VAC  06/17/2013   Procedure: APPLICATION OF WOUND VAC;  Surgeon: Newt Minion, MD;  Location: [place];  Service: Orthopedics;;   BELOW KNEE LEG AMPUTATION Right Jan. 2008   BUBBLE STUDY  07/31/2021   Procedure: BUBBLE STUDY;  Surgeon: Geralynn Rile, MD;  Location: [place];  Service: Cardiovascular;;   CAROTID ENDARTERECTOMY Right    CATARACT EXTRACTION Left 12/25/14   CHOLECYSTECTOMY     COLONOSCOPY     COLONOSCOPY N/A 04/24/2021   Procedure: COLONOSCOPY;  Surgeon: Toledo, Benay Pike, MD;  Location: [facility];  Service: Gastroenterology;  Laterality: N/A;   CORONARY ARTERY BYPASS GRAFT  12/01/2007   "CABG X4" (05/12/2013)   DILATION AND CURETTAGE OF UTERUS     I & D EXTREMITY Left 06/17/2013   Procedure: IRRIGATION AND DEBRIDEMENT EXTREMITY, PLACEMENT ANTIBIOTIC STIMULAN BEADS;  Surgeon: Newt Minion, MD;  Location: [place];  Service: Orthopedics;  Laterality: Left;   INTRAMEDULLARY (IM) NAIL INTERTROCHANTERIC Left 08/20/2018   Procedure: INTRAMEDULLARY (IM) NAIL INTERTROCHANTRIC;  Surgeon: Shona Needles, MD;  Location: [place];  Service: Orthopedics;  Laterality: Left;   INTRAOPERATIVE TRANSTHORACIC ECHOCARDIOGRAM N/A 12/31/2021   Procedure: INTRAOPERATIVE TRANSTHORACIC ECHOCARDIOGRAM;  Surgeon: Sherren Mocha, MD;  Location: [place];  Service: Open Heart Surgery;  Laterality: N/A;    IRRIGATION AND DEBRIDEMENT ABSCESS Left 06/17/2013   ANKLE           DR [name]   RIGHT/LEFT HEART CATH AND CORONARY/GRAFT ANGIOGRAPHY N/A 10/07/2021   Procedure: RIGHT/LEFT HEART CATH AND CORONARY/GRAFT ANGIOGRAPHY;  Surgeon: Sherren Mocha, MD;  Location: [place] [facility];  Service: Cardiovascular;  Laterality: N/A;   TEE WITHOUT CARDIOVERSION N/A 07/31/2021   Procedure: TRANSESOPHAGEAL ECHOCARDIOGRAM (TEE);  Surgeon: Geralynn Rile, MD;  Location: [place];  Service: Cardiovascular;  Laterality: N/A;   TOE AMPUTATION Right    "took off a couple toes before the amputation"   TONSILLECTOMY     TRANSCATHETER AORTIC VALVE REPLACEMENT, TRANSFEMORAL Right 12/31/2021   Procedure: Transcatheter Aortic Valve Replacement, Transfemoral;  Surgeon: Sherren Mocha, MD;  Location: [place];  Service: Open Heart Surgery;  Laterality: Right;     Observations/Objective:   Vitals:   04/08/22 1000  Height: '5\' 5"'$  (1.651 m)   GEN:  The patient appears stated age and is in NAD.  Neurological examination: Patient is awake, alert, oriented x 3. No aphasia or dysarthria. Intact fluency and comprehension. Remote and recent memory intact. Able to name and repeat. Cranial nerves: Extraocular movements intact with no nystagmus. No facial asymmetry. Motor: moves all extremities symmetrically, at least anti-gravity x 4. No incoordination on finger to nose testing. Gait: not tested, patient is in bed      Follow Up Instructions:    -I discussed the assessment and treatment plan with the patient. The patient was provided an opportunity to ask questions and all were answered. The patient agreed with the plan and demonstrated an understanding of the instructions.   The patient was advised to call back or seek an in-person evaluation if the  symptoms worsen or if the condition fails to improve as anticipated.    Total time spent on today's visit was 36 minutes, including both face-to-face time and  nonface-to-face time.  Time included that spent on review of records (prior notes available to me/labs/imaging if pertinent), discussing treatment and goals, answering patient's questions and coordinating care.   Sharene Butters, PA-C

## 2022-04-15 DIAGNOSIS — N390 Urinary tract infection, site not specified: Secondary | ICD-10-CM | POA: Diagnosis not present

## 2022-04-21 DIAGNOSIS — I509 Heart failure, unspecified: Secondary | ICD-10-CM | POA: Diagnosis not present

## 2022-04-21 DIAGNOSIS — Z89511 Acquired absence of right leg below knee: Secondary | ICD-10-CM | POA: Diagnosis not present

## 2022-04-21 DIAGNOSIS — N184 Chronic kidney disease, stage 4 (severe): Secondary | ICD-10-CM | POA: Diagnosis not present

## 2022-04-21 DIAGNOSIS — Z89512 Acquired absence of left leg below knee: Secondary | ICD-10-CM | POA: Diagnosis not present

## 2022-04-21 DIAGNOSIS — E1165 Type 2 diabetes mellitus with hyperglycemia: Secondary | ICD-10-CM | POA: Diagnosis not present

## 2022-04-21 DIAGNOSIS — G546 Phantom limb syndrome with pain: Secondary | ICD-10-CM | POA: Diagnosis not present

## 2022-04-21 DIAGNOSIS — E1142 Type 2 diabetes mellitus with diabetic polyneuropathy: Secondary | ICD-10-CM | POA: Diagnosis not present

## 2022-04-21 DIAGNOSIS — Z794 Long term (current) use of insulin: Secondary | ICD-10-CM | POA: Diagnosis not present

## 2022-04-21 DIAGNOSIS — I700 Atherosclerosis of aorta: Secondary | ICD-10-CM | POA: Diagnosis not present

## 2022-04-26 DIAGNOSIS — Z7984 Long term (current) use of oral hypoglycemic drugs: Secondary | ICD-10-CM | POA: Diagnosis not present

## 2022-04-26 DIAGNOSIS — N184 Chronic kidney disease, stage 4 (severe): Secondary | ICD-10-CM | POA: Diagnosis not present

## 2022-04-26 DIAGNOSIS — L89892 Pressure ulcer of other site, stage 2: Secondary | ICD-10-CM | POA: Diagnosis not present

## 2022-04-26 DIAGNOSIS — E1165 Type 2 diabetes mellitus with hyperglycemia: Secondary | ICD-10-CM | POA: Diagnosis not present

## 2022-04-26 DIAGNOSIS — E1142 Type 2 diabetes mellitus with diabetic polyneuropathy: Secondary | ICD-10-CM | POA: Diagnosis not present

## 2022-04-26 DIAGNOSIS — I509 Heart failure, unspecified: Secondary | ICD-10-CM | POA: Diagnosis not present

## 2022-04-26 DIAGNOSIS — Z794 Long term (current) use of insulin: Secondary | ICD-10-CM | POA: Diagnosis not present

## 2022-04-26 DIAGNOSIS — I130 Hypertensive heart and chronic kidney disease with heart failure and stage 1 through stage 4 chronic kidney disease, or unspecified chronic kidney disease: Secondary | ICD-10-CM | POA: Diagnosis not present

## 2022-04-26 DIAGNOSIS — E1122 Type 2 diabetes mellitus with diabetic chronic kidney disease: Secondary | ICD-10-CM | POA: Diagnosis not present

## 2022-04-26 DIAGNOSIS — R69 Illness, unspecified: Secondary | ICD-10-CM | POA: Diagnosis not present

## 2022-04-26 DIAGNOSIS — Z4800 Encounter for change or removal of nonsurgical wound dressing: Secondary | ICD-10-CM | POA: Diagnosis not present

## 2022-04-26 DIAGNOSIS — D649 Anemia, unspecified: Secondary | ICD-10-CM | POA: Diagnosis not present

## 2022-04-29 DIAGNOSIS — L89892 Pressure ulcer of other site, stage 2: Secondary | ICD-10-CM | POA: Diagnosis not present

## 2022-04-30 DIAGNOSIS — E1142 Type 2 diabetes mellitus with diabetic polyneuropathy: Secondary | ICD-10-CM | POA: Diagnosis not present

## 2022-05-19 ENCOUNTER — Encounter: Payer: Self-pay | Admitting: Oncology

## 2022-05-29 DIAGNOSIS — E1142 Type 2 diabetes mellitus with diabetic polyneuropathy: Secondary | ICD-10-CM | POA: Diagnosis not present

## 2022-06-05 DIAGNOSIS — E1165 Type 2 diabetes mellitus with hyperglycemia: Secondary | ICD-10-CM | POA: Diagnosis not present

## 2022-06-05 DIAGNOSIS — N184 Chronic kidney disease, stage 4 (severe): Secondary | ICD-10-CM | POA: Diagnosis not present

## 2022-06-05 DIAGNOSIS — E1122 Type 2 diabetes mellitus with diabetic chronic kidney disease: Secondary | ICD-10-CM | POA: Diagnosis not present

## 2022-06-05 DIAGNOSIS — Z89512 Acquired absence of left leg below knee: Secondary | ICD-10-CM | POA: Diagnosis not present

## 2022-06-05 DIAGNOSIS — Z794 Long term (current) use of insulin: Secondary | ICD-10-CM | POA: Diagnosis not present

## 2022-06-05 DIAGNOSIS — Z89511 Acquired absence of right leg below knee: Secondary | ICD-10-CM | POA: Diagnosis not present

## 2022-06-09 ENCOUNTER — Other Ambulatory Visit: Payer: Self-pay | Admitting: Physician Assistant

## 2022-06-09 DIAGNOSIS — Z952 Presence of prosthetic heart valve: Secondary | ICD-10-CM

## 2022-06-11 ENCOUNTER — Encounter: Payer: Self-pay | Admitting: Oncology

## 2022-06-16 ENCOUNTER — Telehealth: Payer: Self-pay | Admitting: Cardiology

## 2022-06-16 MED ORDER — ROSUVASTATIN CALCIUM 40 MG PO TABS: 40.0000 mg | ORAL_TABLET | Freq: Every day | ORAL | 1 refills | Status: DC

## 2022-06-16 NOTE — Telephone Encounter (Signed)
*  STAT* If patient is at the pharmacy, call can be transferred to refill team.   1. Which medications need to be refilled? (please list name of each medication and dose if known) rosuvastatin (CRESTOR) 40 MG tablet   2. Which pharmacy/location (including street and city if local pharmacy) is medication to be sent to?  [facility] [place], [place]. ([address]    3. Do they need a 30 day or 90 day supply? 90 day  [facility]. Patient is completely out of medication.

## 2022-06-18 ENCOUNTER — Other Ambulatory Visit: Payer: Self-pay | Admitting: Neurology

## 2022-06-18 ENCOUNTER — Other Ambulatory Visit: Payer: Medicare HMO

## 2022-06-19 ENCOUNTER — Inpatient Hospital Stay: Payer: Medicare Other | Attending: Oncology

## 2022-06-19 DIAGNOSIS — D631 Anemia in chronic kidney disease: Secondary | ICD-10-CM | POA: Insufficient documentation

## 2022-06-19 DIAGNOSIS — R5383 Other fatigue: Secondary | ICD-10-CM | POA: Diagnosis not present

## 2022-06-19 DIAGNOSIS — D539 Nutritional anemia, unspecified: Secondary | ICD-10-CM

## 2022-06-19 DIAGNOSIS — K922 Gastrointestinal hemorrhage, unspecified: Secondary | ICD-10-CM | POA: Diagnosis not present

## 2022-06-19 DIAGNOSIS — D500 Iron deficiency anemia secondary to blood loss (chronic): Secondary | ICD-10-CM | POA: Diagnosis not present

## 2022-06-19 DIAGNOSIS — E1122 Type 2 diabetes mellitus with diabetic chronic kidney disease: Secondary | ICD-10-CM | POA: Diagnosis not present

## 2022-06-19 DIAGNOSIS — I130 Hypertensive heart and chronic kidney disease with heart failure and stage 1 through stage 4 chronic kidney disease, or unspecified chronic kidney disease: Secondary | ICD-10-CM | POA: Insufficient documentation

## 2022-06-19 DIAGNOSIS — F5089 Other specified eating disorder: Secondary | ICD-10-CM | POA: Insufficient documentation

## 2022-06-19 DIAGNOSIS — N183 Chronic kidney disease, stage 3 unspecified: Secondary | ICD-10-CM | POA: Insufficient documentation

## 2022-06-19 DIAGNOSIS — M549 Dorsalgia, unspecified: Secondary | ICD-10-CM | POA: Diagnosis not present

## 2022-06-19 LAB — CBC WITH DIFFERENTIAL/PLATELET
Abs Immature Granulocytes: 0.01 10*3/uL (ref 0.00–0.07)
Basophils Absolute: 0 10*3/uL (ref 0.0–0.1)
Basophils Relative: 0 %
Eosinophils Absolute: 0.2 10*3/uL (ref 0.0–0.5)
Eosinophils Relative: 4 %
HCT: 31.3 % — ABNORMAL LOW (ref 36.0–46.0)
Hemoglobin: 10.3 g/dL — ABNORMAL LOW (ref 12.0–15.0)
Immature Granulocytes: 0 %
Lymphocytes Relative: 20 %
Lymphs Abs: 0.9 10*3/uL (ref 0.7–4.0)
MCH: 30.8 pg (ref 26.0–34.0)
MCHC: 32.9 g/dL (ref 30.0–36.0)
MCV: 93.7 fL (ref 80.0–100.0)
Monocytes Absolute: 0.3 10*3/uL (ref 0.1–1.0)
Monocytes Relative: 8 %
Neutro Abs: 2.9 10*3/uL (ref 1.7–7.7)
Neutrophils Relative %: 68 %
Platelets: 189 10*3/uL (ref 150–400)
RBC: 3.34 MIL/uL — ABNORMAL LOW (ref 3.87–5.11)
RDW: 14.9 % (ref 11.5–15.5)
WBC: 4.3 10*3/uL (ref 4.0–10.5)
nRBC: 0 % (ref 0.0–0.2)

## 2022-06-19 LAB — BASIC METABOLIC PANEL
Anion gap: 10 (ref 5–15)
BUN: 32 mg/dL — ABNORMAL HIGH (ref 8–23)
CO2: 28 mmol/L (ref 22–32)
Calcium: 9 mg/dL (ref 8.9–10.3)
Chloride: 98 mmol/L (ref 98–111)
Creatinine, Ser: 1.71 mg/dL — ABNORMAL HIGH (ref 0.44–1.00)
GFR, Estimated: 33 mL/min — ABNORMAL LOW (ref >60)
Glucose, Bld: 333 mg/dL — ABNORMAL HIGH (ref 70–99)
Potassium: 3.6 mmol/L (ref 3.5–5.1)
Sodium: 136 mmol/L (ref 135–145)

## 2022-06-19 LAB — FERRITIN: Ferritin: 203 ng/mL (ref 11–307)

## 2022-06-25 ENCOUNTER — Inpatient Hospital Stay: Payer: Medicare Other | Admitting: Oncology

## 2022-06-25 VITALS — BP 121/58 | HR 88 | Temp 99.1°F | Resp 12 | Ht 65.0 in

## 2022-06-25 DIAGNOSIS — D539 Nutritional anemia, unspecified: Secondary | ICD-10-CM

## 2022-06-25 DIAGNOSIS — N1832 Chronic kidney disease, stage 3b: Secondary | ICD-10-CM

## 2022-06-25 NOTE — Progress Notes (Signed)
[facility] Cancer Follow up Visit:  Patient Care Team: Daisy Floro, MD as PCP - General (Family Medicine) Rollene Rotunda, MD as PCP - Cardiology (Cardiology) Van Clines, MD as Consulting Physician (Neurology)  CHIEF COMPLAINTS/PURPOSE OF CONSULTATION:   HISTORY OF PRESENTING ILLNESS: [name] 67 y.o. female is here because of  anemia Medical history notable for coronary artery disease, stage III chronic kidney disease, diabetes mellitus type 2, GERD with esophagitis, hyperlipidemia, peripheral vascular disease, TAVR placement, hypertension CHF (EF 35 to 40% post), CAD with CABG x4  April 21 2021:  Admitted to [facility]  for syncopal episode and hematochezia. In the ED she was noted to be and hypoxic requiring 2L O2, hemoglobin 8.4, and troponin 876. Hemoglobin decreased to 7.5 and required transfusion. She had maroon-colored stools.  Colonoscopy revealed  sigmoid diverticulosis, five subcentimeter colon polyps, and erythematous mucosa in sigmoid colon with biopsies showing patchy mild active colitis negative for features of chronicity    December 27, 2021 WBC 8.7 hemoglobin 8.3 MCV 83 platelet count 234 Hemoglobin A1c 9.6 December 31, 2021 BMP notable for glucose 221 creatinine 1.7  January 02 2022:  WBC 10.7 Hgb 8.5 PLT 173  January 22 2022:  [facility] Consult  Patient has been taking oral iron for about 20 yrs with GI intolerance of dyspepsia.  Has received PRBC's several times this year.  Has never received IV iron Fatigued and at times feels real cold.   Sometimes has ice pica.   Having dark stools tinged with blood.   Has not had an EGD. Patient is on ASA and Plavix because of PVD and CAD.    Social:  Married.  Former Engineer, civil (consulting).  Tobacco none.  EtOH none  FMH:  No family members with bleeding disorders  WBC 5.1 hemoglobin 8.2 MCV 83 platelet count 174; 82 segs 11 lymphs 5 monos 2 eos reticulocyte count  1.6 Coombs test negative haptoglobin 267 SPEP with IEP showed no paraprotein.  Serum free kappa 72.3 lambda 31.7 with a kappa lambda 2.28 IgG 1019 IgA a 252 IgM 75 Ferritin 24 folate 8.4 B12 492 CMP notable for glucose of 344 creatinine 2.49 BUN 46 albumin 3.4  February 10 2022: Feraheme 510 mg IV   February 14 2022:  Feraheme 510 mg IV  February 19 2022:  Scheduled follow up.  Has experienced back pain following the infusions which were helped with Robaxan.  (Asked for a script gave one month supply) Energy level improved since receiving iron.  Less fatigued.    March 19 2021:  WBC 4.2 Hgb 10.4 PLT 169.  Ferritin 313  March 26 2022:  Feels pretty good.  Back pain has improved considerably.  Not taking any oral iron.  Instructed patient to begin Flintstone's multivitamin with iron, one tablet daily   June 25, 2022:  Scheduled follow up for anemia and malabsorption.  Feels well.  Taking Flintstone's multivitamin with iron, one tablet daily   BMP notable for glucose of 333 and creatinine 1.71 WBC 4.3 hemoglobin 10.3 platelet count 189; 60 segs 20 lymphs 8 monos 4 eos Ferritin 203  Review of Systems  Constitutional:  Positive for fatigue. Negative for appetite change, chills, fever and unexpected weight change.  HENT:   Positive for sore throat. Negative for mouth sores, nosebleeds and trouble swallowing.   Eyes:  Negative for eye problems and icterus.       Vision changes:  None  Respiratory:  Negative  for chest tightness, cough, hemoptysis, shortness of breath and wheezing.        SOB resolved following heart surgery  Cardiovascular:  Positive for chest pain. Negative for palpitations.       Has intermittent chest pain  Gastrointestinal:  Positive for blood in stool and constipation. Negative for abdominal pain, diarrhea, nausea and vomiting.  Endocrine:       Cold intolerance:  none Heat intolerance:  none  Genitourinary:  Negative for bladder incontinence, difficulty urinating,  dysuria, frequency, hematuria and nocturia.   Musculoskeletal:  Positive for arthralgias, gait problem and myalgias. Negative for back pain.       Generalized arthralgias.  Has pain especially phantom pain  Skin:  Negative for itching, rash and wound.  Neurological:  Positive for extremity weakness, gait problem and headaches. Negative for dizziness, light-headedness and numbness.  Hematological:  Negative for adenopathy.       No bruising.  See HPI  Psychiatric/Behavioral:  Negative for suicidal ideas. [name] not nervous/anxious.     MEDICAL HISTORY: Past Medical History:  Diagnosis Date   Anemia, normocytic normochromic 03/14/2008   Arthritis    "hands"   CAD (coronary artery disease) of artery bypass graft 03/14/2008   Chronic kidney disease (CKD) 12/26/2015   stage 3 due to type 2 diabetes mellitus   Chronic pain syndrome 09/25/2015   Coronary artery disease involving native coronary artery without angina pectoris 09/25/2015   Essential hypertension 03/14/2008   Gastroesophageal reflux disease with esophagitis 07/09/2016   Generalized anxiety disorder with panic attacks    History of one panic attack   GERD (gastroesophageal reflux disease)    takes Zantac   Hyperlipidemia, unspecified 03/14/2008   Localization-related symptomatic epilepsy and epileptic syndromes with complex partial seizures, not intractable, without status epilepticus 06/27/2014   Major depressive disorder 03/14/2008   Neuropathy    Osteomyelitis of ankle or foot, left, acute 06/17/2013   Peripheral vascular disease    PVD (peripheral vascular disease) 03/25/2012   S/P TAVR (transcatheter aortic valve replacement) 12/31/2021   s/p TAVR with a 23 mm Edwards S3UR via the TF approach by Drs Excell Seltzer & Bartle   Type 2 diabetes mellitus with diabetic mononeuropathy, with long-term current use of insulin 03/14/2008    SURGICAL HISTORY: Past Surgical History:  Procedure Laterality Date   AMPUTATION Left  08/22/2013   Procedure: LEFT BELOW KNEE AMPUTATION;  Surgeon: Nadara Mustard, MD;  Location: [facility] OR;  Service: Orthopedics;  Laterality: Left;   ANKLE FUSION Left 05/11/2013   TIBIOCALCANEAL FUSION    ANKLE FUSION Left 05/12/2013   Procedure: LEFT TIBIOCALCANEAL FUSION;  Surgeon: Nadara Mustard, MD;  Location: [facility] OR;  Service: Orthopedics;  Laterality: Left;  Left Tibiocalcaneal Fusion   APPLICATION OF WOUND VAC  06/17/2013   Procedure: APPLICATION OF WOUND VAC;  Surgeon: Nadara Mustard, MD;  Location: [facility] OR;  Service: Orthopedics;;   BELOW KNEE LEG AMPUTATION Right Jan. 2008   BUBBLE STUDY  07/31/2021   Procedure: BUBBLE STUDY;  Surgeon: Sande Rives, MD;  Location: [facility] ENDOSCOPY;  Service: Cardiovascular;;   CAROTID ENDARTERECTOMY Right    CATARACT EXTRACTION Left 12/25/14   CHOLECYSTECTOMY     COLONOSCOPY     COLONOSCOPY N/A 04/24/2021   Procedure: COLONOSCOPY;  Surgeon: Toledo, Boykin Nearing, MD;  Location: [facility] ENDOSCOPY;  Service: Gastroenterology;  Laterality: N/A;   CORONARY ARTERY BYPASS GRAFT  12/01/2007   "CABG X4" (05/12/2013)   DILATION AND CURETTAGE OF UTERUS  I & D EXTREMITY Left 06/17/2013   Procedure: IRRIGATION AND DEBRIDEMENT EXTREMITY, PLACEMENT ANTIBIOTIC STIMULAN BEADS;  Surgeon: Nadara Mustard, MD;  Location: [facility] OR;  Service: Orthopedics;  Laterality: Left;   INTRAMEDULLARY (IM) NAIL INTERTROCHANTERIC Left 08/20/2018   Procedure: INTRAMEDULLARY (IM) NAIL INTERTROCHANTRIC;  Surgeon: Roby Lofts, MD;  Location: [facility] OR;  Service: Orthopedics;  Laterality: Left;   INTRAOPERATIVE TRANSTHORACIC ECHOCARDIOGRAM N/A 12/31/2021   Procedure: INTRAOPERATIVE TRANSTHORACIC ECHOCARDIOGRAM;  Surgeon: Tonny Bollman, MD;  Location: [facility] OR;  Service: Open Heart Surgery;  Laterality: N/A;   IRRIGATION AND DEBRIDEMENT ABSCESS Left 06/17/2013   ANKLE           DR [name]   RIGHT/LEFT HEART CATH AND CORONARY/GRAFT ANGIOGRAPHY N/A 10/07/2021   Procedure: RIGHT/LEFT HEART CATH AND CORONARY/GRAFT  ANGIOGRAPHY;  Surgeon: Tonny Bollman, MD;  Location: [facility] INVASIVE CV LAB;  Service: Cardiovascular;  Laterality: N/A;   TEE WITHOUT CARDIOVERSION N/A 07/31/2021   Procedure: TRANSESOPHAGEAL ECHOCARDIOGRAM (TEE);  Surgeon: Sande Rives, MD;  Location: [facility] ENDOSCOPY;  Service: Cardiovascular;  Laterality: N/A;   TOE AMPUTATION Right    "took off a couple toes before the amputation"   TONSILLECTOMY     TRANSCATHETER AORTIC VALVE REPLACEMENT, TRANSFEMORAL Right 12/31/2021   Procedure: Transcatheter Aortic Valve Replacement, Transfemoral;  Surgeon: Tonny Bollman, MD;  Location: [facility] OR;  Service: Open Heart Surgery;  Laterality: Right;    SOCIAL HISTORY: Social History   Socioeconomic History   Marital status: Married    Spouse name: Not on file   Number of children: 2   Years of education: 14   Highest education level: Associate degree: occupational, Scientist, product/process development, or vocational program  Occupational History   Occupation: Disability  Tobacco Use   Smoking status: Never   Smokeless tobacco: Never  Vaping Use   Vaping Use: Never used  Substance and Sexual Activity   Alcohol use: No   Drug use: No   Sexual activity: Not Currently  Other Topics Concern   Not on file  Social History Narrative   Right handed    Lives in a one story home   Drinks caffeine    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   Social Determinants of Health   Financial Resource Strain: Not on file  Food Insecurity: No Food Insecurity (12/31/2021)   Hunger Vital Sign    Worried About Running Out of Food in the Last Year: Never true    Ran Out of Food in the Last Year: Never true  Transportation Needs: No Transportation Needs (12/31/2021)   PRAPARE - Administrator, Civil Service (Medical): No    Lack of Transportation (Non-Medical): No  Physical Activity: Not on file  Stress: Not on file  Social Connections: Not on file  Intimate Partner Violence: Not At Risk (12/31/2021)   Humiliation,  Afraid, Rape, and Kick questionnaire    Fear of Current or Ex-Partner: No    Emotionally Abused: No    Physically Abused: No    Sexually Abused: No    FAMILY HISTORY Family History  Problem Relation Age of Onset   Cancer Mother    Heart disease Father    Diabetes Father    Hyperlipidemia Father    Hypertension Father    Alzheimer's disease Maternal Grandfather     ALLERGIES:  is allergic to metformin hcl, tape, ace inhibitors, actos [pioglitazone], atorvastatin, candesartan cilexetil, gabapentin, influenza vaccines, isosorbide nitrate, keflex [cephalexin], quinapril hcl, and tramadol hcl.  MEDICATIONS:  Current  Outpatient Medications  Medication Sig Dispense Refill   acetaminophen-codeine (TYLENOL #3) 300-30 MG tablet Take 1 tablet by mouth every 6 (six) hours as needed for severe pain.     albuterol (VENTOLIN HFA) 108 (90 Base) MCG/ACT inhaler Inhale 1-2 puffs into the lungs every 6 (six) hours as needed for wheezing or shortness of breath.      ALPRAZolam (XANAX) 0.5 MG tablet Take 0.5 mg by mouth 2 (two) times daily as needed for anxiety.     amoxicillin (AMOXIL) 500 MG tablet TAKE 4 TABLETS BY MOUTH AS DIRECTED 1 HOUR PRIOR TO DENTAL WORK INCLUDING CLEANINGS 4 tablet 7   aspirin 81 MG tablet Take 1 tablet (81 mg total) by mouth daily. 90 tablet 0   carvedilol (COREG) 6.25 MG tablet Take 1 tablet (6.25 mg total) by mouth 2 (two) times daily. 180 tablet 3   clopidogrel (PLAVIX) 75 MG tablet Take 1 tablet (75 mg total) by mouth daily. 90 tablet 3   cycloSPORINE (RESTASIS) 0.05 % ophthalmic emulsion Place 1 drop into both eyes 2 (two) times daily as needed (dry eyes).     diazepam (VALIUM) 2 MG tablet Take one tab 30 mins before the MRI, may add another dose prior to it if needed. 2 tablet 0   diclofenac Sodium (VOLTAREN) 1 % GEL Apply 1 application. topically 4 (four) times daily as needed (pain).     ezetimibe (ZETIA) 10 MG tablet Take 10 mg by mouth daily.     fluconazole  (DIFLUCAN) 150 MG tablet Take 1 tablet (150 mg total) by mouth daily as needed (For flair up). 3 tablet 6   HUMALOG KWIKPEN 100 UNIT/ML KwikPen Inject 12 Units into the skin 2 (two) times daily with a meal.     JARDIANCE 25 MG TABS tablet Take 25 mg by mouth daily.     LANTUS SOLOSTAR 100 UNIT/ML Solostar Pen Inject 38 Units into the skin at bedtime.     levETIRAcetam (KEPPRA XR) 750 MG 24 hr tablet TAKE 2 TABLETS (1,500 MG TOTAL) BY MOUTH AT BEDTIME. 60 tablet 1   lidocaine (LIDODERM) 5 % Place 1 patch onto the skin daily.     losartan (COZAAR) 50 MG tablet Take 50 mg by mouth daily.     methocarbamol (ROBAXIN) 500 MG tablet Take 1 tablet (500 mg total) by mouth every 8 (eight) hours as needed for up to 40 doses for muscle spasms. 40 tablet 0   Multiple Vitamins-Minerals (MULTIVITAMINS THER. W/MINERALS) TABS Take 1 tablet by mouth daily.       mupirocin ointment (BACTROBAN) 2 % Apply 1 Application topically 3 (three) times daily.     nitroGLYCERIN (NITROSTAT) 0.4 MG SL tablet Place 1 tablet (0.4 mg total) under the tongue every 5 (five) minutes as needed for chest pain. 25 tablet 3   pregabalin (LYRICA) 50 MG capsule Take 1 capsule every night 90 capsule 2   rosuvastatin (CRESTOR) 40 MG tablet Take 1 tablet (40 mg total) by mouth daily. 30 tablet 1   sertraline (ZOLOFT) 100 MG tablet Take 1 tablet (100 mg total) by mouth 2 (two) times daily. 180 tablet 0   torsemide (DEMADEX) 20 MG tablet Take 1 tablet (20 mg total) by mouth daily. TAKE EXTRA TABLET AS NEEDED 90 tablet 3   No current facility-administered medications for this visit.    PHYSICAL EXAMINATION:  ECOG PERFORMANCE STATUS: 2 - Symptomatic, <50% confined to bed   Vitals:   06/25/22 1519  BP: (!) 121/58  Pulse: 88  Resp: 12  Temp: 99.1 F (37.3 C)  SpO2: 95%     There were no vitals filed for this visit.    Physical Exam Vitals and nursing note reviewed.  Constitutional:      Appearance: Normal appearance. She is  not toxic-appearing or diaphoretic.     Comments: Here with husband.  Seated in wheel chair.    HENT:     Head: Normocephalic and atraumatic.     Right Ear: External ear normal.     Left Ear: External ear normal.     Nose: Nose normal. No congestion or rhinorrhea.  Eyes:     General: No scleral icterus.    Extraocular Movements: Extraocular movements intact.     Conjunctiva/sclera: Conjunctivae normal.     Pupils: Pupils are equal, round, and reactive to light.  Cardiovascular:     Rate and Rhythm: Normal rate and regular rhythm.     Heart sounds: Murmur heard.     No friction rub. No gallop.  Pulmonary:     Effort: Pulmonary effort is normal. No respiratory distress.     Breath sounds: Normal breath sounds. No stridor. No wheezing or rhonchi.  Abdominal:     General: Bowel sounds are normal.     Palpations: Abdomen is soft.     Tenderness: There is no abdominal tenderness. There is no guarding or rebound.  Musculoskeletal:        General: Deformity present. No swelling or tenderness.     Cervical back: Normal range of motion and neck supple. No rigidity or tenderness.     Comments: Has bilateral leg prostheses   Lymphadenopathy:     Head:     Right side of head: No submental, submandibular, tonsillar, preauricular, posterior auricular or occipital adenopathy.     Left side of head: No submental, submandibular, tonsillar, preauricular, posterior auricular or occipital adenopathy.     Cervical: No cervical adenopathy.     Right cervical: No superficial, deep or posterior cervical adenopathy.    Left cervical: No superficial, deep or posterior cervical adenopathy.     Upper Body:     Right upper body: No supraclavicular, axillary, pectoral or epitrochlear adenopathy.     Left upper body: No supraclavicular, axillary, pectoral or epitrochlear adenopathy.  Skin:    General: Skin is warm.     Coloration: Skin is pale. Skin is not jaundiced.     Findings: No erythema.   Neurological:     General: No focal deficit present.     Mental Status: She is alert and oriented to person, place, and time.     Cranial Nerves: No cranial nerve deficit.  Psychiatric:        Mood and Affect: Mood normal.        Behavior: Behavior normal.        Thought Content: Thought content normal.        Judgment: Judgment normal.     LABORATORY DATA: I have personally reviewed the data as listed:  Appointment on 06/19/2022  Component Date Value Ref Range Status   Sodium 06/19/2022 136  135 - 145 mmol/L Final   Potassium 06/19/2022 3.6  3.5 - 5.1 mmol/L Final   Chloride 06/19/2022 98  98 - 111 mmol/L Final   CO2 06/19/2022 28  22 - 32 mmol/L Final   Glucose, Bld 06/19/2022 333 (H)  70 - 99 mg/dL Final   Glucose reference range applies only to samples taken after fasting for  at least 8 hours.   BUN 06/19/2022 32 (H)  8 - 23 mg/dL Final   Creatinine, Ser 06/19/2022 1.71 (H)  0.44 - 1.00 mg/dL Final   Calcium 16/12/9602 9.0  8.9 - 10.3 mg/dL Final   GFR, Estimated 06/19/2022 33 (L)  >60 mL/min Final   Comment: (NOTE) Calculated using the CKD-EPI Creatinine Equation (2021)    Anion gap 06/19/2022 10  5 - 15 Final   Performed at [facility], [address], [place], [place] [address]   Ferritin 06/19/2022 203  11 - 307 ng/mL Final   Performed at [facility], [address], [place], [place] [address]   WBC 06/19/2022 4.3  4.0 - 10.5 K/uL Final   RBC 06/19/2022 3.34 (L)  3.87 - 5.11 MIL/uL Final   Hemoglobin 06/19/2022 10.3 (L)  12.0 - 15.0 g/dL Final   HCT 78/29/5621 31.3 (L)  36.0 - 46.0 % Final   MCV 06/19/2022 93.7  80.0 - 100.0 fL Final   MCH 06/19/2022 30.8  26.0 - 34.0 pg Final   MCHC 06/19/2022 32.9  30.0 - 36.0 g/dL Final   RDW 30/86/5784 14.9  11.5 - 15.5 % Final   Platelets 06/19/2022 189  150 - 400 K/uL Final   nRBC 06/19/2022 0.0  0.0 - 0.2 % Final   Neutrophils Relative % 06/19/2022 68  % Final   Neutro Abs  06/19/2022 2.9  1.7 - 7.7 K/uL Final   Lymphocytes Relative 06/19/2022 20  % Final   Lymphs Abs 06/19/2022 0.9  0.7 - 4.0 K/uL Final   Monocytes Relative 06/19/2022 8  % Final   Monocytes Absolute 06/19/2022 0.3  0.1 - 1.0 K/uL Final   Eosinophils Relative 06/19/2022 4  % Final   Eosinophils Absolute 06/19/2022 0.2  0.0 - 0.5 K/uL Final   Basophils Relative 06/19/2022 0  % Final   Basophils Absolute 06/19/2022 0.0  0.0 - 0.1 K/uL Final   Immature Granulocytes 06/19/2022 0  % Final   Abs Immature Granulocytes 06/19/2022 0.01  0.00 - 0.07 K/uL Final   Performed at [facility], [address], [place], [place] [address]    RADIOGRAPHIC STUDIES: I have personally reviewed the radiological images as listed and agree with the findings in the report  No results found.  ASSESSMENT/PLAN 67 y.o. female with medical history notable for stage III chronic kidney disease, diabetes mellitus type 2, GERD with esophagitis, hyperlipidemia, peripheral vascular disease, TAVR placement, hypertension CHF (EF 35 to 40% post), CAD with CABG x4.  Patient seen for management of anemia.    Anemia:  Secondary to iron deficiency from GI blood loss exacerbated by ongoing need for antiplatelet therapy and CKD.    February 10 2022: Feraheme 510 mg IV   February 14 2022:  Feraheme 510 mg IV  March 19 2022:  Hgb 10.4 Ferritin 313  June 25 2022- Hgb 10.3 Ferritin 203. Continue low dose oral iron   Anemia in chronic kidney disease:  Iron is required for an adequate erythropoietic response to EPO.  Correction of iron deficiency allows lower usage of exogenous EPO.  Iron deficiency is also associated with impaired exercise performance, cognitive impairment or decreased quality of life, increased risk in hospitalization or death in patients with CHF.  IV iron is beneficial in dialysis dependent CKD and nondialysis dependent CKD, as it is more efficacious in rising ferritin and Hb levels, while reducing ESA  and transfusion requirements. Given improvement of Hgb to >  10.0 g/dL will hold on starting an ESA at this time   Back pain: Likely multifactorial.  Musculoskeletal and possibly a minor rxn to IV iron.   Fatigue:  Multifactorial and improved after receiving IV iron.    DM Type II:  Brittle, with highs and lows.  Driving force behind kidney and cardiovascular disease.     Cancer Staging  No matching staging information was found for the patient.   No problem-specific Assessment & Plan notes found for this encounter.   No orders of the defined types were placed in this encounter.  20  minutes was spent in patient care.  This included time spent preparing to see the patient (e.g., review of tests), obtaining and/or reviewing separately obtained history, counseling and educating the patient/family/caregiver, ordering medications, tests,documenting clinical information in the electronic or other health record, independently interpreting results and communicating results to the patient/family/caregiver as well as coordination of care.      All questions were answered. The patient knows to call the clinic with any problems, questions or concerns.  This note was electronically signed.    Loni MuseEverett C Floretta Petro, MD  06/25/2022 3:25 PM

## 2022-06-28 DIAGNOSIS — E1142 Type 2 diabetes mellitus with diabetic polyneuropathy: Secondary | ICD-10-CM | POA: Diagnosis not present

## 2022-07-02 ENCOUNTER — Encounter: Payer: Self-pay | Admitting: Oncology

## 2022-07-03 DIAGNOSIS — E1165 Type 2 diabetes mellitus with hyperglycemia: Secondary | ICD-10-CM | POA: Diagnosis not present

## 2022-07-06 ENCOUNTER — Other Ambulatory Visit: Payer: Self-pay | Admitting: Physician Assistant

## 2022-07-14 ENCOUNTER — Other Ambulatory Visit: Payer: Self-pay | Admitting: Cardiology

## 2022-07-14 DIAGNOSIS — Z993 Dependence on wheelchair: Secondary | ICD-10-CM | POA: Diagnosis not present

## 2022-07-14 DIAGNOSIS — R300 Dysuria: Secondary | ICD-10-CM | POA: Diagnosis not present

## 2022-07-14 DIAGNOSIS — I700 Atherosclerosis of aorta: Secondary | ICD-10-CM | POA: Diagnosis not present

## 2022-07-14 DIAGNOSIS — G546 Phantom limb syndrome with pain: Secondary | ICD-10-CM | POA: Diagnosis not present

## 2022-07-28 DIAGNOSIS — E1142 Type 2 diabetes mellitus with diabetic polyneuropathy: Secondary | ICD-10-CM | POA: Diagnosis not present

## 2022-07-28 DIAGNOSIS — W57XXXA Bitten or stung by nonvenomous insect and other nonvenomous arthropods, initial encounter: Secondary | ICD-10-CM | POA: Diagnosis not present

## 2022-07-28 DIAGNOSIS — S60861A Insect bite (nonvenomous) of right wrist, initial encounter: Secondary | ICD-10-CM | POA: Diagnosis not present

## 2022-07-28 DIAGNOSIS — R350 Frequency of micturition: Secondary | ICD-10-CM | POA: Diagnosis not present

## 2022-07-30 ENCOUNTER — Other Ambulatory Visit: Payer: Self-pay

## 2022-07-30 DIAGNOSIS — Z952 Presence of prosthetic heart valve: Secondary | ICD-10-CM

## 2022-07-30 MED ORDER — AMOXICILLIN 500 MG PO TABS
ORAL_TABLET | ORAL | 6 refills | Status: DC
Start: 2022-07-30 — End: 2022-10-06

## 2022-08-02 DIAGNOSIS — E1165 Type 2 diabetes mellitus with hyperglycemia: Secondary | ICD-10-CM | POA: Diagnosis not present

## 2022-08-03 NOTE — Progress Notes (Deleted)
Cardiology Office Note:   Date:  08/03/2022  ID:  TAIZ HORTON, DOB Aug 23, 1955, MRN [id number]  History of Present Illness:   [name] is a 67 y.o. female who presents for evaluation of CAD and systolic HF.  She has had a history of CABG, bilateral lower extremity amputations chronic kidney disease.  She was admitted to [facility] .   She had acute on chronic heart failure.  Her discharge weight was 177 pounds.  She does have chronic renal insufficiency with her last creatinine being 1.74.  An echocardiogram in September at [facility] demonstrated an EF of 30 to 35%.  There was thinning and akinesis of the mid anterior, mid anteroseptal, basal anterior and basal anteroseptal segments.  There was moderately thickened aortic valve with severe aortic stenosis.  There was mild mitral regurgitation.  I was going to consider cardiac cath but her creat was increased when I saw her in the office for the first visit.  The creat was 2.83.  I reduced her Demadex.  Echo was ordered.  Preliminary reading still demonstrates that the ejection fraction is reduced with severe aortic stenosis.  She was admitted to [facility] in Dec with respiratory failure.  She was COVID positive.  She had elevated trop with demand ischemia.    EF was again 35 - 40%.  She was back in the hospital in Feb with syncope.  She had hypotension.  She also had anemia.  Colonoscopy did not demonstrate active bleed.  She had AKI.    She had a TEE.  This demonstrated severe aortic stenosis.  There was moderate MR.  EF was 40 - 45%.  The tricuspid valve was heavily calcified.  There was moderate mitral regurgitation secondary to a restricted posterior leaflet.  There is mild mitral valve stenosis.  Aortic stenosis was severe.  This is expected to be low-flow low gradient.  Was heavily calcified.  There was plaque in the aorta noted. She is now status post TAVR.   Her ejection fraction is up to 50 to 55%.    ***   ***Follow-up echo done last week  demonstrated normal functioning TAVR.  She still has a heavily calcified mitral valve with moderate stenosis and mild regurgitation.   Overall she feels better.  She is not having the cough she was having.  She is not having pain when she takes a deep breath.  She is having no acute shortness of breath.  She denies any palpitations, presyncope or syncope.  She has been seen by hematolog  ROS: ***  Studies Reviewed:    EKG:  ***  ***  Risk Assessment/Calculations:   {Does this patient have ATRIAL FIBRILLATION?[phone]} No BP recorded.  {Refresh Note OR Click here to enter BP  :1}***        Physical Exam:   VS:  There were no vitals taken for this visit.   Wt Readings from Last 3 Encounters:  03/26/22 176 lb (79.8 kg)  03/04/22 177 lb (80.3 kg)  02/19/22 177 lb 1.6 oz (80.3 kg)     GEN: Well nourished, well developed in no acute distress NECK: No JVD; No carotid bruits CARDIAC: ***RRR, no murmurs, rubs, gallops RESPIRATORY:  Clear to auscultation without rales, wheezing or rhonchi  ABDOMEN: Soft, non-tender, non-distended EXTREMITIES:  No edema; No deformity   ASSESSMENT AND PLAN:   CHRONIC SYSTOLIC HF:    Her ejection fraction has improved.   ***  She seems to be euvolemic.  No change in  therapy.   TAVR: She did really well post TAVR .   She had normal valve function on echo in Nov.  ***  No change in therapy.    HTN:   ***  This is at target.  No change in therapy.   CAD/CABG:   ***  She is having no unstable angina.  She will continue with risk reduction.    MITRAL REGURGITATION/MS:    There was moderate MS and mild MR on echo in Nov 2023.  ***  This was moderate.  This would be managed conservatively.  I will follow this clinically.    CKD:    Creat most recently was ***  1.79 which is about her baseline.     ANEMIA:   ***    {Are you ordering a CV Procedure (e.g. stress test, cath, DCCV, TEE, etc)?   Press F2        :[id number]}   Signed, Rollene Rotunda, MD

## 2022-08-05 ENCOUNTER — Ambulatory Visit: Payer: Medicare Other | Attending: Cardiology | Admitting: Cardiology

## 2022-08-05 DIAGNOSIS — Z952 Presence of prosthetic heart valve: Secondary | ICD-10-CM

## 2022-08-05 DIAGNOSIS — I5022 Chronic systolic (congestive) heart failure: Secondary | ICD-10-CM

## 2022-08-05 DIAGNOSIS — I2510 Atherosclerotic heart disease of native coronary artery without angina pectoris: Secondary | ICD-10-CM

## 2022-08-05 DIAGNOSIS — N1832 Chronic kidney disease, stage 3b: Secondary | ICD-10-CM

## 2022-08-05 DIAGNOSIS — I10 Essential (primary) hypertension: Secondary | ICD-10-CM

## 2022-08-05 DIAGNOSIS — I340 Nonrheumatic mitral (valve) insufficiency: Secondary | ICD-10-CM

## 2022-08-07 ENCOUNTER — Other Ambulatory Visit: Payer: Self-pay | Admitting: Physician Assistant

## 2022-08-24 ENCOUNTER — Other Ambulatory Visit: Payer: Self-pay | Admitting: Physician Assistant

## 2022-08-28 DIAGNOSIS — E1142 Type 2 diabetes mellitus with diabetic polyneuropathy: Secondary | ICD-10-CM | POA: Diagnosis not present

## 2022-09-02 DIAGNOSIS — E1165 Type 2 diabetes mellitus with hyperglycemia: Secondary | ICD-10-CM | POA: Diagnosis not present

## 2022-09-09 ENCOUNTER — Other Ambulatory Visit: Payer: Self-pay | Admitting: Neurology

## 2022-09-09 DIAGNOSIS — G546 Phantom limb syndrome with pain: Secondary | ICD-10-CM

## 2022-09-11 NOTE — Progress Notes (Signed)
Cardiology Office Note:   Date:  09/12/2022  ID:  [name], DOB 12-12-1955, MRN [id number] PCP: Daisy Floro, MD  [facility] Providers Cardiologist:  Rollene Rotunda, MD {  History of Present Illness:   [name] is a 67 y.o. female who presents for evaluation of CAD and systolic HF.  She has had a history of CABG, bilateral lower extremity amputations chronic kidney disease.  She was admitted to [facility] .   She had acute on chronic heart failure.  Her discharge weight was 177 pounds.  She does have chronic renal insufficiency with her last creatinine being 1.74.  An echocardiogram in September at [facility] demonstrated an EF of 30 to 35%.  There was thinning and akinesis of the mid anterior, mid anteroseptal, basal anterior and basal anteroseptal segments.  There was moderately thickened aortic valve with severe aortic stenosis.  There was mild mitral regurgitation.  I was going to consider cardiac cath but her creat was increased when I saw her in the office for the first visit.  The creat was 2.83.  I reduced her Demadex.  Echo was ordered.  Preliminary reading still demonstrates that the ejection fraction is reduced with severe aortic stenosis.  She was admitted to [facility] in Dec with respiratory failure.  She was COVID positive.  She had elevated trop with demand ischemia.    EF was again 35 - 40%.  She was back in the hospital in Feb with syncope.  She had hypotension.  She also had anemia.  Colonoscopy did not demonstrate active bleed.  She had AKI.    She had a TEE. This demonstrated severe aortic stenosis.  There was moderate MR.  EF was 40 - 45%.  The tricuspid valve was heavily calcified.  There was moderate mitral regurgitation secondary to a restricted posterior leaflet.  There is mild mitral valve stenosis.  Aortic stenosis was severe.  This is expected to be low-flow low gradient.  Was heavily calcified.  There was plaque in the aorta noted.  She is now status post TAVR.    Follow-up echo done last week demonstrated normal functioning TAVR.  Her ejection fraction is up to 50 to 55%.  She still has a heavily calcified mitral valve with moderate stenosis and mild regurgitation.  Since I last saw her she has done well. The patient denies any new symptoms such as chest discomfort, neck or arm discomfort. There has been no new shortness of breath, PND or orthopnea. There have been no reported palpitations, presyncope or syncope.  She gets around in a wheelchair but also walks on a prosthesis for short distances.    ROS: As stated in the HPI and negative for all other systems.  Studies Reviewed:    EKG:   NA   Risk Assessment/Calculations:              Physical Exam:   VS:  BP 113/64 (BP Location: Right Arm, Patient Position: Sitting, Cuff Size: Normal)   Pulse 66   Ht 5\' 5"  (1.651 m)   Wt 169 lb (76.7 kg)   SpO2 95%   BMI 28.12 kg/m    Wt Readings from Last 3 Encounters:  09/12/22 169 lb (76.7 kg)  03/26/22 176 lb (79.8 kg)  03/04/22 177 lb (80.3 kg)     GEN: Well nourished, well developed in no acute distress NECK: No JVD; No carotid bruits CARDIAC: RRR, 2/6 apical systolic murmur, no diastolic murmurs, rubs, gallops RESPIRATORY:  Clear to auscultation without rales, wheezing or rhonchi  ABDOMEN: Soft, non-tender, non-distended EXTREMITIES:  No edema; No deformity   ASSESSMENT AND PLAN:   CHRONIC SYSTOLIC HF:    Her EF was improved at the last visit.  No change in therapy.   TAVR: She status post TAVR and has [facility] in October.  She has a planned echocardiogram at that time.    HTN:   Her blood pressure is at target.  No change in therapy.   CAD/CABG:   Given her significant coronary disease with occluded grafts on her last cath I will continue DAPT.    MITRAL REGURGITATION:   This was moderate some mild.  No change in therapy.  She   AKI:    Creat most recently was stable at 1.71.  No change in  therapy.  DM: A1c is up to 9.2 but she was just recently referred to endocrinology.        Follow up me in 12 months.   Signed, Rollene Rotunda, MD

## 2022-09-12 ENCOUNTER — Other Ambulatory Visit: Payer: Self-pay

## 2022-09-12 ENCOUNTER — Encounter: Payer: Self-pay | Admitting: Cardiology

## 2022-09-12 ENCOUNTER — Ambulatory Visit: Payer: Medicare Other | Attending: Cardiology | Admitting: Cardiology

## 2022-09-12 VITALS — BP 113/64 | HR 66 | Ht 65.0 in | Wt 169.0 lb

## 2022-09-12 DIAGNOSIS — Z952 Presence of prosthetic heart valve: Secondary | ICD-10-CM

## 2022-09-12 DIAGNOSIS — N179 Acute kidney failure, unspecified: Secondary | ICD-10-CM | POA: Diagnosis not present

## 2022-09-12 DIAGNOSIS — I25810 Atherosclerosis of coronary artery bypass graft(s) without angina pectoris: Secondary | ICD-10-CM

## 2022-09-12 DIAGNOSIS — I5022 Chronic systolic (congestive) heart failure: Secondary | ICD-10-CM

## 2022-09-12 DIAGNOSIS — I739 Peripheral vascular disease, unspecified: Secondary | ICD-10-CM

## 2022-09-12 DIAGNOSIS — I7025 Atherosclerosis of native arteries of other extremities with ulceration: Secondary | ICD-10-CM

## 2022-09-12 MED ORDER — EZETIMIBE 10 MG PO TABS: 10.0000 mg | ORAL_TABLET | Freq: Every day | ORAL | 3 refills | Status: DC

## 2022-09-12 MED ORDER — TORSEMIDE 20 MG PO TABS: ORAL_TABLET | ORAL | 3 refills | Status: DC

## 2022-09-12 MED ORDER — CARVEDILOL 6.25 MG PO TABS: 6.2500 mg | ORAL_TABLET | Freq: Two times a day (BID) | ORAL | 3 refills | Status: DC

## 2022-09-12 MED ORDER — CLOPIDOGREL BISULFATE 75 MG PO TABS
75.0000 mg | ORAL_TABLET | Freq: Every day | ORAL | 3 refills | Status: DC
Start: 2022-09-12 — End: 2023-09-17

## 2022-09-12 MED ORDER — LOSARTAN POTASSIUM 50 MG PO TABS: 50.0000 mg | ORAL_TABLET | Freq: Every day | ORAL | 3 refills | Status: DC

## 2022-09-12 NOTE — Patient Instructions (Signed)
Medication Instructions:   Your physician recommends that you continue on your current medications as directed. Please refer to the Current Medication list given to you today.  *If you need a refill on your cardiac medications before your next appointment, please call your pharmacy*  Lab Work: NONE ordered at this time of appointment   If you have labs (blood work) drawn today and your tests are completely normal, you will receive your results only by: MyChart Message (if you have MyChart) OR A paper copy in the mail If you have any lab test that is abnormal or we need to change your treatment, we will call you to review the results.  Testing/Procedures: NONE ordered at this time of appointment   Follow-Up: At [facility], you and your health needs are our priority.  As part of our continuing mission to provide you with exceptional heart care, we have created designated Provider Care Teams.  These Care Teams include your primary Cardiologist (physician) and Advanced Practice Providers (APPs -  Physician Assistants and Nurse Practitioners) who all work together to provide you with the care you need, when you need it.   Your next appointment:   1 year(s)  Provider:   James Hochrein, MD     Other Instructions   

## 2022-09-22 NOTE — Progress Notes (Signed)
[facility] Cancer Follow up Visit:  Patient Care Team: Daisy Floro, MD as PCP - General (Family Medicine) Rollene Rotunda, MD as PCP - Cardiology (Cardiology) Van Clines, MD as Consulting Physician (Neurology)  CHIEF COMPLAINTS/PURPOSE OF CONSULTATION:   HISTORY OF PRESENTING ILLNESS: [name] 67 y.o. female is here because of  anemia Medical history notable for coronary artery disease, stage III chronic kidney disease, diabetes mellitus type 2, GERD with esophagitis, hyperlipidemia, peripheral vascular disease, TAVR placement, hypertension CHF (EF 35 to 40% post), CAD with CABG x4  April 21 2021:  Admitted to [facility]  for syncopal episode and hematochezia. In the ED she was noted to be and hypoxic requiring 2L O2, hemoglobin 8.4, and troponin 876. Hemoglobin decreased to 7.5 and required transfusion. She had maroon-colored stools.  Colonoscopy revealed  sigmoid diverticulosis, five subcentimeter colon polyps, and erythematous mucosa in sigmoid colon with biopsies showing patchy mild active colitis negative for features of chronicity    December 27, 2021 WBC 8.7 hemoglobin 8.3 MCV 83 platelet count 234 Hemoglobin A1c 9.6 December 31, 2021 BMP notable for glucose 221 creatinine 1.7  January 02 2022:  WBC 10.7 Hgb 8.5 PLT 173  January 22 2022:  [facility] Hematology Consult  Patient has been taking oral iron for about 20 yrs with GI intolerance of dyspepsia.  Has received PRBC's several times this year.  Has never received IV iron Fatigued and at times feels real cold.   Sometimes has ice pica.   Having dark stools tinged with blood.   Has not had an EGD. Patient is on ASA and Plavix because of PVD and CAD.    Social:  Married.  Former Engineer, civil (consulting).  Tobacco none.  EtOH none  FMH:  No family members with bleeding disorders  WBC 5.1 hemoglobin 8.2 MCV 83 platelet count 174; 82 segs 11 lymphs 5 monos 2 eos reticulocyte count  1.6 Coombs test negative haptoglobin 267 SPEP with IEP showed no paraprotein.  Serum free kappa 72.3 lambda 31.7 with a kappa lambda 2.28 IgG 1019 IgA a 252 IgM 75 Ferritin 24 folate 8.4 B12 492 CMP notable for glucose of 344 creatinine 2.49 BUN 46 albumin 3.4  February 10 2022: Feraheme 510 mg IV   February 14 2022:  Feraheme 510 mg IV  February 19 2022:  Scheduled follow up.  Has experienced back pain following the infusions which were helped with Robaxan.  (Asked for a script gave one month supply) Energy level improved since receiving iron.  Less fatigued.    March 19 2021:  WBC 4.2 Hgb 10.4 PLT 169.  Ferritin 313  March 26 2022:  Feels pretty good.  Back pain has improved considerably.  Not taking any oral iron.  Instructed patient to begin Flintstone's multivitamin with iron, one tablet daily   June 25, 2022:   Feels well.  Taking Flintstone's multivitamin with iron, one tablet daily   BMP notable for glucose of 333 and creatinine 1.71 WBC 4.3 hemoglobin 10.3 platelet count 189; 60 segs 20 lymphs 8 monos 4 eos Ferritin 203  September 24 2022:  Scheduled follow up for anemia and malabsorption.   Recently spent a day at the beach.  Taking Flintstone's multivitamin with iron, one tablet daily but ran out a week ago.  Back pain comes and goes.  FSBG dropped to 67 today and responded to drinking some ginger ale.      Hemoglobin 9.5 Ferritin 178 Glucose glucose 247  creatinine 2.15 albumin 3.3  Review of Systems  Constitutional:  Positive for fatigue. Negative for appetite change, chills, fever and unexpected weight change.  HENT:   Negative for mouth sores, nosebleeds, sore throat and trouble swallowing.   Eyes:  Negative for eye problems and icterus.       Vision changes:  None  Respiratory:  Negative for chest tightness, cough, hemoptysis, shortness of breath and wheezing.        SOB resolved following heart surgery  Cardiovascular:  Positive for chest pain. Negative for  palpitations.       Has intermittent chest pain  Gastrointestinal:  Negative for abdominal pain, blood in stool, constipation, diarrhea, nausea and vomiting.  Endocrine:       Cold intolerance:  none Heat intolerance:  none  Genitourinary:  Negative for bladder incontinence, difficulty urinating, dysuria, frequency, hematuria and nocturia.   Musculoskeletal:  Positive for arthralgias, gait problem and myalgias. Negative for back pain.       Generalized arthralgias.  Has pain especially phantom pain  Skin:  Negative for itching, rash and wound.  Neurological:  Positive for extremity weakness, gait problem and headaches. Negative for dizziness, light-headedness and numbness.  Hematological:  Negative for adenopathy.       No bruising.  See HPI  Psychiatric/Behavioral:  Negative for suicidal ideas. [name] not nervous/anxious.     MEDICAL HISTORY: Past Medical History:  Diagnosis Date   Anemia, normocytic normochromic 03/14/2008   Arthritis    "hands"   CAD (coronary artery disease) of artery bypass graft 03/14/2008   Chronic kidney disease (CKD) 12/26/2015   stage 3 due to type 2 diabetes mellitus   Chronic pain syndrome 09/25/2015   Coronary artery disease involving native coronary artery without angina pectoris 09/25/2015   Essential hypertension 03/14/2008   Gastroesophageal reflux disease with esophagitis 07/09/2016   Generalized anxiety disorder with panic attacks    History of one panic attack   GERD (gastroesophageal reflux disease)    takes Zantac   Hyperlipidemia, unspecified 03/14/2008   Localization-related symptomatic epilepsy and epileptic syndromes with complex partial seizures, not intractable, without status epilepticus 06/27/2014   Major depressive disorder 03/14/2008   Neuropathy    Osteomyelitis of ankle or foot, left, acute 06/17/2013   Peripheral vascular disease    PVD (peripheral vascular disease) 03/25/2012   S/P TAVR (transcatheter aortic valve  replacement) 12/31/2021   s/p TAVR with a 23 mm Edwards S3UR via the TF approach by Drs Excell Seltzer & Bartle   Type 2 diabetes mellitus with diabetic mononeuropathy, with long-term current use of insulin 03/14/2008    SURGICAL HISTORY: Past Surgical History:  Procedure Laterality Date   AMPUTATION Left 08/22/2013   Procedure: LEFT BELOW KNEE AMPUTATION;  Surgeon: Nadara Mustard, MD;  Location: [facility] OR;  Service: Orthopedics;  Laterality: Left;   ANKLE FUSION Left 05/11/2013   TIBIOCALCANEAL FUSION    ANKLE FUSION Left 05/12/2013   Procedure: LEFT TIBIOCALCANEAL FUSION;  Surgeon: Nadara Mustard, MD;  Location: [facility] OR;  Service: Orthopedics;  Laterality: Left;  Left Tibiocalcaneal Fusion   APPLICATION OF WOUND VAC  06/17/2013   Procedure: APPLICATION OF WOUND VAC;  Surgeon: Nadara Mustard, MD;  Location[facility] OR;  Service: Orthopedics;;   BELOW KNEE LEG AMPUTATION Right Jan. 2008   BUBBLE STUDY  07/31/2021   Procedure: BUBBLE STUDY;  Surgeon: Sande Rives, MD;  Location: [facility] ENDOSCOPY;  Service: Cardiovascular;;   CAROTID ENDARTERECTOMY Right    CATARACT  EXTRACTION Left 12/25/14   CHOLECYSTECTOMY     COLONOSCOPY     COLONOSCOPY N/A 04/24/2021   Procedure: COLONOSCOPY;  Surgeon: Toledo, Boykin Nearing, MD;  Location: [facility] ENDOSCOPY;  Service: Gastroenterology;  Laterality: N/A;   CORONARY ARTERY BYPASS GRAFT  12/01/2007   "CABG X4" (05/12/2013)   DILATION AND CURETTAGE OF UTERUS     I & D EXTREMITY Left 06/17/2013   Procedure: IRRIGATION AND DEBRIDEMENT EXTREMITY, PLACEMENT ANTIBIOTIC STIMULAN BEADS;  Surgeon: Nadara Mustard, MD;  Location: [facility] OR;  Service: Orthopedics;  Laterality: Left;   INTRAMEDULLARY (IM) NAIL INTERTROCHANTERIC Left 08/20/2018   Procedure: INTRAMEDULLARY (IM) NAIL INTERTROCHANTRIC;  Surgeon: Roby Lofts, MD;  Location: [facility] OR;  Service: Orthopedics;  Laterality: Left;   INTRAOPERATIVE TRANSTHORACIC ECHOCARDIOGRAM N/A 12/31/2021   Procedure: INTRAOPERATIVE TRANSTHORACIC ECHOCARDIOGRAM;   Surgeon: Tonny Bollman, MD;  Location: [facility] OR;  Service: Open Heart Surgery;  Laterality: N/A;   IRRIGATION AND DEBRIDEMENT ABSCESS Left 06/17/2013   ANKLE           DR [name]   RIGHT/LEFT HEART CATH AND CORONARY/GRAFT ANGIOGRAPHY N/A 10/07/2021   Procedure: RIGHT/LEFT HEART CATH AND CORONARY/GRAFT ANGIOGRAPHY;  Surgeon: Tonny Bollman, MD;  Location: [facility] INVASIVE CV LAB;  Service: Cardiovascular;  Laterality: N/A;   TEE WITHOUT CARDIOVERSION N/A 07/31/2021   Procedure: TRANSESOPHAGEAL ECHOCARDIOGRAM (TEE);  Surgeon: Sande Rives, MD;  Location: [facility] ENDOSCOPY;  Service: Cardiovascular;  Laterality: N/A;   TOE AMPUTATION Right    "took off a couple toes before the amputation"   TONSILLECTOMY     TRANSCATHETER AORTIC VALVE REPLACEMENT, TRANSFEMORAL Right 12/31/2021   Procedure: Transcatheter Aortic Valve Replacement, Transfemoral;  Surgeon: Tonny Bollman, MD;  Location: [facility] OR;  Service: Open Heart Surgery;  Laterality: Right;    SOCIAL HISTORY: Social History   Socioeconomic History   Marital status: Married    Spouse name: Not on file   Number of children: 2   Years of education: 14   Highest education level: Associate degree: occupational, Scientist, product/process development, or vocational program  Occupational History   Occupation: Disability  Tobacco Use   Smoking status: Never   Smokeless tobacco: Never  Vaping Use   Vaping Use: Never used  Substance and Sexual Activity   Alcohol use: No   Drug use: No   Sexual activity: Not Currently  Other Topics Concern   Not on file  Social History Narrative   Right handed    Lives in a one story home   Drinks caffeine    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   Social Determinants of Health   Financial Resource Strain: Not on file  Food Insecurity: No Food Insecurity (12/31/2021)   Hunger Vital Sign    Worried About Running Out of Food in the Last Year: Never true    Ran Out of Food in the Last Year: Never true  Transportation Needs: No  Transportation Needs (12/31/2021)   PRAPARE - Administrator, Civil Service (Medical): No    Lack of Transportation (Non-Medical): No  Physical Activity: Not on file  Stress: Not on file  Social Connections: Not on file  Intimate Partner Violence: Not At Risk (12/31/2021)   Humiliation, Afraid, Rape, and Kick questionnaire    Fear of Current or Ex-Partner: No    Emotionally Abused: No    Physically Abused: No    Sexually Abused: No    FAMILY HISTORY Family History  Problem Relation Age of Onset   Cancer Mother  Heart disease Father    Diabetes Father    Hyperlipidemia Father    Hypertension Father    Alzheimer's disease Maternal Grandfather     ALLERGIES:  is allergic to metformin hcl, tape, ace inhibitors, actos [pioglitazone], atorvastatin, candesartan cilexetil, gabapentin, influenza vaccines, isosorbide nitrate, keflex [cephalexin], quinapril hcl, and tramadol hcl.  MEDICATIONS:  Current Outpatient Medications  Medication Sig Dispense Refill   acetaminophen-codeine (TYLENOL #3) 300-30 MG tablet Take 1 tablet by mouth every 6 (six) hours as needed for severe pain.     albuterol (VENTOLIN HFA) 108 (90 Base) MCG/ACT inhaler Inhale 1-2 puffs into the lungs every 6 (six) hours as needed for wheezing or shortness of breath.      ALPRAZolam (XANAX) 0.5 MG tablet Take 0.5 mg by mouth 2 (two) times daily as needed for anxiety.     amoxicillin (AMOXIL) 500 MG tablet TAKE 4 TABLETS BY MOUTH AS DIRECTED 1 HOUR PRIOR TO DENTAL WORK INCLUDING CLEANINGS 12 tablet 6   aspirin 81 MG tablet Take 1 tablet (81 mg total) by mouth daily. 90 tablet 0   carvedilol (COREG) 6.25 MG tablet Take 1 tablet (6.25 mg total) by mouth 2 (two) times daily. 180 tablet 3   clopidogrel (PLAVIX) 75 MG tablet Take 1 tablet (75 mg total) by mouth daily. 90 tablet 3   diclofenac Sodium (VOLTAREN) 1 % GEL Apply 1 application. topically 4 (four) times daily as needed (pain).     ezetimibe (ZETIA) 10 MG  tablet Take 1 tablet (10 mg total) by mouth daily. 90 tablet 3   fluconazole (DIFLUCAN) 150 MG tablet TAKE 1 TABLET( 150 MG) BY MOUTH DAILY AS NEEDED FOR FLARE UP 3 tablet 6   HUMALOG KWIKPEN 100 UNIT/ML KwikPen Inject 12 Units into the skin 2 (two) times daily with a meal.     JARDIANCE 25 MG TABS tablet Take 25 mg by mouth daily.     LANTUS SOLOSTAR 100 UNIT/ML Solostar Pen Inject 38 Units into the skin at bedtime.     levETIRAcetam (KEPPRA XR) 750 MG 24 hr tablet TAKE 2 TABLETS (1,500 MG TOTAL) BY MOUTH AT BEDTIME. 60 tablet 1   lidocaine (LIDODERM) 5 % Place 1 patch onto the skin daily.     losartan (COZAAR) 50 MG tablet Take 1 tablet (50 mg total) by mouth daily. 90 tablet 3   methocarbamol (ROBAXIN) 500 MG tablet Take 1 tablet (500 mg total) by mouth every 8 (eight) hours as needed for up to 40 doses for muscle spasms. 40 tablet 0   Multiple Vitamins-Minerals (MULTIVITAMINS THER. W/MINERALS) TABS Take 1 tablet by mouth daily.       mupirocin ointment (BACTROBAN) 2 % Apply 1 Application topically 3 (three) times daily.     nitroGLYCERIN (NITROSTAT) 0.4 MG SL tablet DISSOLVE 1 TABLET UNDER THE TONGUE EVERY 5 MINUTES AS DIRECTED AS NEEDED FOR CHEST PAIN 75 tablet 2   pregabalin (LYRICA) 50 MG capsule TAKE 1 CAPSULE BY MOUTH EVERY NIGHT 90 capsule 3   rosuvastatin (CRESTOR) 40 MG tablet TAKE 1 TABLET(40 MG) BY MOUTH DAILY 90 tablet 3   sertraline (ZOLOFT) 100 MG tablet Take 1 tablet (100 mg total) by mouth 2 (two) times daily. 180 tablet 0   torsemide (DEMADEX) 20 MG tablet TAKE 1 TABLET BY MOUTH ONCE DAILY; TAKE EXTRA TABLET AS NEEDED 90 tablet 3   No current facility-administered medications for this visit.    PHYSICAL EXAMINATION:  ECOG PERFORMANCE STATUS: 2 - Symptomatic, <50% confined to  bed   There were no vitals filed for this visit.    There were no vitals filed for this visit.    Physical Exam Vitals and nursing note reviewed.  Constitutional:      Appearance: Normal  appearance. She is not toxic-appearing or diaphoretic.     Comments: Here with husband.  Seated in wheel chair.    HENT:     Head: Normocephalic and atraumatic.     Right Ear: External ear normal.     Left Ear: External ear normal.     Nose: Nose normal. No congestion or rhinorrhea.  Eyes:     General: No scleral icterus.    Extraocular Movements: Extraocular movements intact.     Conjunctiva/sclera: Conjunctivae normal.     Pupils: Pupils are equal, round, and reactive to light.  Cardiovascular:     Rate and Rhythm: Normal rate and regular rhythm.     Heart sounds: Murmur heard.     No friction rub. No gallop.  Pulmonary:     Effort: Pulmonary effort is normal. No respiratory distress.     Breath sounds: Normal breath sounds. No stridor. No wheezing or rhonchi.  Abdominal:     General: Bowel sounds are normal.     Palpations: Abdomen is soft.     Tenderness: There is no abdominal tenderness. There is no guarding or rebound.  Musculoskeletal:        General: Deformity present. No swelling or tenderness.     Cervical back: Normal range of motion and neck supple. No rigidity or tenderness.     Comments: Has bilateral leg prostheses   Lymphadenopathy:     Head:     Right side of head: No submental, submandibular, tonsillar, preauricular, posterior auricular or occipital adenopathy.     Left side of head: No submental, submandibular, tonsillar, preauricular, posterior auricular or occipital adenopathy.     Cervical: No cervical adenopathy.     Right cervical: No superficial, deep or posterior cervical adenopathy.    Left cervical: No superficial, deep or posterior cervical adenopathy.     Upper Body:     Right upper body: No supraclavicular, axillary, pectoral or epitrochlear adenopathy.     Left upper body: No supraclavicular, axillary, pectoral or epitrochlear adenopathy.  Skin:    General: Skin is warm.     Coloration: Skin is pale. Skin is not jaundiced.     Findings: No  erythema.  Neurological:     General: No focal deficit present.     Mental Status: She is alert and oriented to person, place, and time.     Cranial Nerves: No cranial nerve deficit.  Psychiatric:        Mood and Affect: Mood normal.        Behavior: Behavior normal.        Thought Content: Thought content normal.        Judgment: Judgment normal.    LABORATORY DATA: I have personally reviewed the data as listed:  No visits with results within 1 Month(s) from this visit.  Latest known visit with results is:  Appointment on 06/19/2022  Component Date Value Ref Range Status   Sodium 06/19/2022 136  135 - 145 mmol/L Final   Potassium 06/19/2022 3.6  3.5 - 5.1 mmol/L Final   Chloride 06/19/2022 98  98 - 111 mmol/L Final   CO2 06/19/2022 28  22 - 32 mmol/L Final   Glucose, Bld 06/19/2022 333 (H)  70 - 99 mg/dL Final  Glucose reference range applies only to samples taken after fasting for at least 8 hours.   BUN 06/19/2022 32 (H)  8 - 23 mg/dL Final   Creatinine, Ser 06/19/2022 1.71 (H)  0.44 - 1.00 mg/dL Final   Calcium 84/69/6295 9.0  8.9 - 10.3 mg/dL Final   GFR, Estimated 06/19/2022 33 (L)  >60 mL/min Final   Comment: (NOTE) Calculated using the CKD-EPI Creatinine Equation (2021)    Anion gap 06/19/2022 10  5 - 15 Final   Performed at [facility], [address], [place], [place] [address]   Ferritin 06/19/2022 203  11 - 307 ng/mL Final   Performed at [facility], [address] [place], [place] [address]   WBC 06/19/2022 4.3  4.0 - 10.5 K/uL Final   RBC 06/19/2022 3.34 (L)  3.87 - 5.11 MIL/uL Final   Hemoglobin 06/19/2022 10.3 (L)  12.0 - 15.0 g/dL Final   HCT 02/72/5366 31.3 (L)  36.0 - 46.0 % Final   MCV 06/19/2022 93.7  80.0 - 100.0 fL Final   MCH 06/19/2022 30.8  26.0 - 34.0 pg Final   MCHC 06/19/2022 32.9  30.0 - 36.0 g/dL Final   RDW 44/05/4740 14.9  11.5 - 15.5 % Final   Platelets 06/19/2022 189  150 - 400 K/uL Final   nRBC  06/19/2022 0.0  0.0 - 0.2 % Final   Neutrophils Relative % 06/19/2022 68  % Final   Neutro Abs 06/19/2022 2.9  1.7 - 7.7 K/uL Final   Lymphocytes Relative 06/19/2022 20  % Final   Lymphs Abs 06/19/2022 0.9  0.7 - 4.0 K/uL Final   Monocytes Relative 06/19/2022 8  % Final   Monocytes Absolute 06/19/2022 0.3  0.1 - 1.0 K/uL Final   Eosinophils Relative 06/19/2022 4  % Final   Eosinophils Absolute 06/19/2022 0.2  0.0 - 0.5 K/uL Final   Basophils Relative 06/19/2022 0  % Final   Basophils Absolute 06/19/2022 0.0  0.0 - 0.1 K/uL Final   Immature Granulocytes 06/19/2022 0  % Final   Abs Immature Granulocytes 06/19/2022 0.01  0.00 - 0.07 K/uL Final   Performed at [facility], [address], [place], [place] [address]    RADIOGRAPHIC STUDIES: I have personally reviewed the radiological images as listed and agree with the findings in the report  No results found.  ASSESSMENT/PLAN 67 y.o. female with medical history notable for stage III chronic kidney disease, diabetes mellitus type 2, GERD with esophagitis, hyperlipidemia, peripheral vascular disease, TAVR placement, hypertension CHF (EF 35 to 40% post), CAD with CABG x4.  Patient seen for management of anemia.    Anemia:  Secondary to iron deficiency from GI blood loss exacerbated by ongoing need for antiplatelet therapy and CKD.    February 10 2022: Feraheme 510 mg IV   February 14 2022:  Feraheme 510 mg IV  March 19 2022:  Hgb 10.4 Ferritin 313  June 25 2022- Hgb 10.3 Ferritin 203. Continue low dose oral iron  September 24 2022- Hemoglobin 9.5.  Ferritin 178.  Patient to get new supply of low dose oral iron   Anemia in chronic kidney disease:  Iron is required for an adequate erythropoietic response to EPO.  Correction of iron deficiency allows lower usage of exogenous EPO.  Iron deficiency is also associated with impaired exercise performance, cognitive impairment or decreased quality of life, increased risk in  hospitalization or death in patients with CHF.  IV iron is  beneficial in dialysis dependent CKD and nondialysis dependent CKD, as it is more efficacious in rising ferritin and Hb levels, while reducing ESA and transfusion requirements.  September 24 2022- Given prior improvement of Hgb to >10.0 g/dL held on starting an ESA.  Hgb today is 9.5 in setting of adequate iron stores.  If Hgb remains < 10.0 will begin ESA  Back pain: Likely multifactorial.  Musculoskeletal and possibly a minor rxn to IV iron.   Fatigue:  Multifactorial and improved after receiving IV iron.    DM Type II:  Brittle, with highs and lows.  Driving force behind kidney and cardiovascular disease.    September 24 2022- FSBG's remain poorly controlled   Cancer Staging  No matching staging information was found for the patient.   No problem-specific Assessment & Plan notes found for this encounter.   No orders of the defined types were placed in this encounter.  20  minutes was spent in patient care.  This included time spent preparing to see the patient (e.g., review of tests), obtaining and/or reviewing separately obtained history, counseling and educating the patient/family/caregiver, ordering medications, tests,documenting clinical information in the electronic or other health record, independently interpreting results and communicating results to the patient/family/caregiver as well as coordination of care.      All questions were answered. The patient knows to call the clinic with any problems, questions or concerns.  This note was electronically signed.    Loni Muse, MD  09/22/2022 12:59 PM

## 2022-09-24 ENCOUNTER — Inpatient Hospital Stay: Payer: Medicare Other

## 2022-09-24 ENCOUNTER — Inpatient Hospital Stay: Payer: Medicare Other | Attending: Oncology | Admitting: Oncology

## 2022-09-24 VITALS — BP 106/57 | HR 66 | Temp 97.5°F | Resp 95 | Ht 65.0 in

## 2022-09-24 DIAGNOSIS — D539 Nutritional anemia, unspecified: Secondary | ICD-10-CM

## 2022-09-24 DIAGNOSIS — D631 Anemia in chronic kidney disease: Secondary | ICD-10-CM | POA: Diagnosis not present

## 2022-09-24 DIAGNOSIS — E1122 Type 2 diabetes mellitus with diabetic chronic kidney disease: Secondary | ICD-10-CM | POA: Diagnosis not present

## 2022-09-24 DIAGNOSIS — N1832 Chronic kidney disease, stage 3b: Secondary | ICD-10-CM | POA: Diagnosis not present

## 2022-09-24 DIAGNOSIS — D500 Iron deficiency anemia secondary to blood loss (chronic): Secondary | ICD-10-CM | POA: Diagnosis not present

## 2022-09-24 DIAGNOSIS — M549 Dorsalgia, unspecified: Secondary | ICD-10-CM | POA: Insufficient documentation

## 2022-09-24 DIAGNOSIS — K922 Gastrointestinal hemorrhage, unspecified: Secondary | ICD-10-CM | POA: Insufficient documentation

## 2022-09-24 DIAGNOSIS — N183 Chronic kidney disease, stage 3 unspecified: Secondary | ICD-10-CM | POA: Diagnosis not present

## 2022-09-24 DIAGNOSIS — Z794 Long term (current) use of insulin: Secondary | ICD-10-CM

## 2022-09-24 DIAGNOSIS — E1165 Type 2 diabetes mellitus with hyperglycemia: Secondary | ICD-10-CM | POA: Diagnosis not present

## 2022-09-24 LAB — CMP (CANCER CENTER ONLY)
ALT: 23 U/L (ref 0–44)
AST: 24 U/L (ref 15–41)
Albumin: 3.3 g/dL — ABNORMAL LOW (ref 3.5–5.0)
Alkaline Phosphatase: 59 U/L (ref 38–126)
Anion gap: 9 (ref 5–15)
BUN: 32 mg/dL — ABNORMAL HIGH (ref 8–23)
CO2: 23 mmol/L (ref 22–32)
Calcium: 8.8 mg/dL — ABNORMAL LOW (ref 8.9–10.3)
Chloride: 103 mmol/L (ref 98–111)
Creatinine: 2.15 mg/dL — ABNORMAL HIGH (ref 0.44–1.00)
GFR, Estimated: 25 mL/min — ABNORMAL LOW (ref >60)
Glucose, Bld: 247 mg/dL — ABNORMAL HIGH (ref 70–99)
Potassium: 3.9 mmol/L (ref 3.5–5.1)
Sodium: 135 mmol/L (ref 135–145)
Total Bilirubin: 0.4 mg/dL (ref 0.3–1.2)
Total Protein: 7.1 g/dL (ref 6.5–8.1)

## 2022-09-24 LAB — CBC WITH DIFFERENTIAL (CANCER CENTER ONLY)
Abs Immature Granulocytes: 0.01 10*3/uL (ref 0.00–0.07)
Basophils Absolute: 0 10*3/uL (ref 0.0–0.1)
Basophils Relative: 0 %
Eosinophils Absolute: 0.2 10*3/uL (ref 0.0–0.5)
Eosinophils Relative: 3 %
HCT: 29.7 % — ABNORMAL LOW (ref 36.0–46.0)
Hemoglobin: 9.5 g/dL — ABNORMAL LOW (ref 12.0–15.0)
Immature Granulocytes: 0 %
Lymphocytes Relative: 16 %
Lymphs Abs: 0.9 10*3/uL (ref 0.7–4.0)
MCH: 30.5 pg (ref 26.0–34.0)
MCHC: 32 g/dL (ref 30.0–36.0)
MCV: 95.5 fL (ref 80.0–100.0)
Monocytes Absolute: 0.4 10*3/uL (ref 0.1–1.0)
Monocytes Relative: 8 %
Neutro Abs: 4.1 10*3/uL (ref 1.7–7.7)
Neutrophils Relative %: 73 %
Platelet Count: 180 10*3/uL (ref 150–400)
RBC: 3.11 MIL/uL — ABNORMAL LOW (ref 3.87–5.11)
RDW: 15.3 % (ref 11.5–15.5)
WBC Count: 5.6 10*3/uL (ref 4.0–10.5)
nRBC: 0 % (ref 0.0–0.2)

## 2022-09-24 LAB — FERRITIN: Ferritin: 178 ng/mL (ref 11–307)

## 2022-09-25 ENCOUNTER — Telehealth: Payer: Self-pay | Admitting: Oncology

## 2022-09-25 ENCOUNTER — Other Ambulatory Visit: Payer: Self-pay | Admitting: Neurology

## 2022-09-25 NOTE — Telephone Encounter (Signed)
Patient has been scheduled. Aware of appt date and time   Message Received: Isaiah Blakes, CMA sent to [employer] Scheduling 4 months with labs

## 2022-09-27 DIAGNOSIS — E1142 Type 2 diabetes mellitus with diabetic polyneuropathy: Secondary | ICD-10-CM | POA: Diagnosis not present

## 2022-10-02 DIAGNOSIS — E1165 Type 2 diabetes mellitus with hyperglycemia: Secondary | ICD-10-CM | POA: Diagnosis not present

## 2022-10-05 ENCOUNTER — Other Ambulatory Visit: Payer: Self-pay | Admitting: Physician Assistant

## 2022-10-05 DIAGNOSIS — Z952 Presence of prosthetic heart valve: Secondary | ICD-10-CM

## 2022-10-07 ENCOUNTER — Encounter: Payer: Self-pay | Admitting: Oncology

## 2022-10-28 DIAGNOSIS — E1142 Type 2 diabetes mellitus with diabetic polyneuropathy: Secondary | ICD-10-CM | POA: Diagnosis not present

## 2022-11-01 ENCOUNTER — Other Ambulatory Visit: Payer: Self-pay | Admitting: Cardiology

## 2022-11-02 DIAGNOSIS — E1165 Type 2 diabetes mellitus with hyperglycemia: Secondary | ICD-10-CM | POA: Diagnosis not present

## 2022-11-05 ENCOUNTER — Other Ambulatory Visit: Payer: Self-pay | Admitting: Cardiology

## 2022-11-12 DIAGNOSIS — R300 Dysuria: Secondary | ICD-10-CM | POA: Diagnosis not present

## 2022-11-27 DIAGNOSIS — E1142 Type 2 diabetes mellitus with diabetic polyneuropathy: Secondary | ICD-10-CM | POA: Diagnosis not present

## 2022-12-10 DIAGNOSIS — E1165 Type 2 diabetes mellitus with hyperglycemia: Secondary | ICD-10-CM | POA: Diagnosis not present

## 2022-12-24 ENCOUNTER — Encounter: Payer: Self-pay | Admitting: Neurology

## 2022-12-24 ENCOUNTER — Ambulatory Visit: Payer: Medicare Other | Admitting: Neurology

## 2022-12-24 VITALS — BP 153/75 | HR 92

## 2022-12-24 DIAGNOSIS — M545 Low back pain, unspecified: Secondary | ICD-10-CM

## 2022-12-24 DIAGNOSIS — G546 Phantom limb syndrome with pain: Secondary | ICD-10-CM | POA: Diagnosis not present

## 2022-12-24 DIAGNOSIS — G40209 Localization-related (focal) (partial) symptomatic epilepsy and epileptic syndromes with complex partial seizures, not intractable, without status epilepticus: Secondary | ICD-10-CM

## 2022-12-24 DIAGNOSIS — R519 Headache, unspecified: Secondary | ICD-10-CM

## 2022-12-24 DIAGNOSIS — R531 Weakness: Secondary | ICD-10-CM

## 2022-12-24 MED ORDER — PREGABALIN 50 MG PO CAPS
ORAL_CAPSULE | ORAL | 4 refills | Status: DC
Start: 2022-12-24 — End: 2023-04-17

## 2022-12-24 MED ORDER — LEVETIRACETAM ER 750 MG PO TB24: ORAL_TABLET | ORAL | 4 refills | Status: DC

## 2022-12-24 NOTE — Patient Instructions (Signed)
Good to see you.  Increase Pregabalin 50mg : Take 1 capsule twice a day. This should also help with headaches   2. Continue Keppra XR 750mg : Take 2 tablets every night  3. Referral will be sent for home Physical therapy  4. Follow-up in 1 year, call for any changes   Seizure Precautions: 1. If medication has been prescribed for you to prevent seizures, take it exactly as directed.  Do not stop taking the medicine without talking to your doctor first, even if you have not had a seizure in a long time.   2. Avoid activities in which a seizure would cause danger to yourself or to others.  Don't operate dangerous machinery, swim alone, or climb in high or dangerous places, such as on ladders, roofs, or girders.  Do not drive unless your doctor says you may.  3. If you have any warning that you may have a seizure, lay down in a safe place where you can't hurt yourself.    4.  No driving for 6 months from last seizure, as per [facility].   Please refer to the following link on the [employer]ile[employer] of [employer]'s website for more information: http://www.epilepsyfoundation.org/answerplace/Social/driving/drivingu.cfm   5.  Maintain good sleep hygiene.   6.  Contact your doctor if you have any problems that may be related to the medicine you are taking.  7.  Call 911 and bring the patient back to the ED if:        A.  The seizure lasts longer than 5 minutes.       B.  The patient doesn't awaken shortly after the seizure  C.  The patient has new problems such as difficulty seeing, speaking or moving  D.  The patient was injured during the seizure  E.  The patient has a temperature over 102 F (39C)  F.  The patient vomited and now is having trouble breathing

## 2022-12-24 NOTE — Progress Notes (Signed)
NEUROLOGY FOLLOW UP OFFICE NOTE  [name] [id number] [birth year]  HISTORY OF PRESENT ILLNESS: I had the pleasure of seeing [name] in follow-up in the neurology clinic on 12/24/2022. She is accompanied by her husband who helps supplement the history today. She was last seen by PA Marlowe Kays in December 2023/January 2024 when she reported new onset headaches that started around 01/2022 with pain initially on left side, then on the right side occurring 3 times a day. MRI brain without contrast done 03/2022 no acute changes, there was mild chronic microvascular disease, again note of absent signal in the intracranial left ICA similar to 2015, likely due to chronic left ICA occlusion. She noted that BP would go up when upset or dehydrated.  She denies any seizures since 2015 on Levetiracetam ER 1500mg  at bedtime, no side effects. She is on Lyrica 50mg  at bedtime for phantom limb pain, she has been having an increase in pain and taking it twice a day lately, which helps. She still has occasional headaches very 2 weeks or so, they are dull "not real bad," no nausea/vomiting. She reports new symptoms where she cannot get out of bed in the morning. She feels like her legs won't move. No weakness in her arms but she cannot push herself up. She has a grab bar and cannot hardly reach it. Her husband has to pull her up. She denies any difficulties during the day, it only occurs in the morning as she tries to get out of bed. She has back pain. She has sores in her feet. She states she is drinking more water, then admits to drinking only 1 8oz glass a day. She denies any falls but has slid off the bed.    HPI:  This is a 67 yo RH woman with a history of diabetes, hypertension, hyperlipidemia, peripheral vascular disease s/p right carotid endarterectomy, CAD s/p CABG, and right BKA secondary to diabetes. She was admitted for left foot osteomyelitis due to poor healing wound after a fall last winter. She  underwent left transtibial amputation on 08/22/13. She was discharged home on 06/11, then started having altered mental status on 06/15. Her son reports she had hit her head in the left frontal region when getting into their car, then started acting drowsy and out of it the next day. That evening, they were getting ready to eat when her right arm flexed and clenched into her chest followed by head turn to the left,stiffening and shaking for 1 minute as she fell to her left side. She was confused for 10 minutes, back to baseline within 30 minutes. She was brought to [facility] where BP was noted to be 205/87. She was given IV labetalol with slow improvement in BP. Blood sugar noted to be 303. Head CT showed low attenuation in the left corona radiata and centrum semiovale. She had an MRI brain initially without contrast which showed increased T2 and FLAIR signal in the left hemisphere from the anterior frontal cortex, supplemental motor area, extending down to the posterior limb of the internal capsule. She became agitated and needed sedation for MRI brain with contrast, however again became restless with motion degradation, no abnormal enhancement seen. Left ICA flow void was noted to be absent, similar since 2009. It was felt that symptoms were not consistent with left MCA ischemia and concern was for viral CNS infection and received 4 days of empiric IV Acyclovir. She underwent a lumbar puncture which showed WBC 1, RBC  17, glucose 82, protein 29, VDRL, CMV, HSV, arbovirus, gram stain and culture negative. No malignant cells on cytology. She was started on Keppra with no further seizures, discharged home on 06/18. She was apparently intermittently confused at home, woke up better then next day, then had another seizure that morning. Her son today reports she had a generalized convulsion with head deviation to left for 5 minutes. On arrival to the ER, per records, she was poorly responsive with right gaze deviation,  nystagmoid eye movements. BP 197/80 She was given IV Ativan, according to her son she remained in this state for an hour, then was unresponsive for 36 hours after. Prolonged EEG showed diffuse slowing, no electrographic seizures or epileptiform discharges. Repeat MRI brain with and without contrast was again degraded by motion. Similar abnormal signal in the left frontal and parietal lobes again noted, predominantly in the centrum semiovale extending into the subcortical white matter. There is an area of questionable thickening in the left frontal cortex noted to be unchanged, no effacement of sulci in this area, no restricted diffusion, no abnormal enhancement although study is suboptimal. Chronically occluded left ICA again noted. She was discharged home on Keppra XR 1500mg /day. Repeat MRI 2 months later showed complete resolution of abnormal signal in the left frontal and parietal regions. There was mild chronic small vessel disease bilaterally. Per report, findings could have been due to cerebritis or conceivably due to venous thrombosis without infarction.   Of note, in 2009, she had a nocturnal convulsion and was admitted to [facility]. Records unavailable for review, according to her son she was not started on seizure medication due to first seizure. She had an MRI brain at that time which I personally reviewed, it was noted to be limited by motion and patient refusing to complete study, however on FLAIR sequences, white matter changes in the left hemisphere are not present.   Epilepsy Risk Factors: Left frontal white matter lesion that has resolved. Otherwise she had a normal birth and early development. There is no history of febrile convulsions, significant traumatic brain injury, neurosurgical procedures, or family history of seizures.   Diagnostic Data: Neuropsychological testing done in June 2021 was largely within expected normative ranges. There were relative weaknesses across certain aspects of  executive functioning, as well as some performance variability across several domains, but scores within normal limits. Etiology of cognitive changes possibly multifactorial, but overall no indication of a neurodegenerative illness  PAST MEDICAL HISTORY: Past Medical History:  Diagnosis Date   Anemia, normocytic normochromic 03/14/2008   Arthritis    "hands"   CAD (coronary artery disease) of artery bypass graft 03/14/2008   Chronic kidney disease (CKD) 12/26/2015   stage 3 due to type 2 diabetes mellitus   Chronic pain syndrome 09/25/2015   Coronary artery disease involving native coronary artery without angina pectoris 09/25/2015   Essential hypertension 03/14/2008   Gastroesophageal reflux disease with esophagitis 07/09/2016   Generalized anxiety disorder with panic attacks    History of one panic attack   GERD (gastroesophageal reflux disease)    takes Zantac   Hyperlipidemia, unspecified 03/14/2008   Localization-related symptomatic epilepsy and epileptic syndromes with complex partial seizures, not intractable, without status epilepticus 06/27/2014   Major depressive disorder 03/14/2008   Neuropathy    Osteomyelitis of ankle or foot, left, acute 06/17/2013   Peripheral vascular disease    PVD (peripheral vascular disease) 03/25/2012   S/P TAVR (transcatheter aortic valve replacement) 12/31/2021   s/p TAVR with a  23 mm Melodye Ped via the TF approach by Drs Excell Seltzer & Bartle   Type 2 diabetes mellitus with diabetic mononeuropathy, with long-term current use of insulin 03/14/2008    MEDICATIONS: Current Outpatient Medications on File Prior to Visit  Medication Sig Dispense Refill   acetaminophen-codeine (TYLENOL #3) 300-30 MG tablet Take 1 tablet by mouth every 6 (six) hours as needed for severe pain.     albuterol (VENTOLIN HFA) 108 (90 Base) MCG/ACT inhaler Inhale 1-2 puffs into the lungs every 6 (six) hours as needed for wheezing or shortness of breath.      ALPRAZolam  (XANAX) 0.5 MG tablet Take 0.5 mg by mouth 2 (two) times daily as needed for anxiety.     amoxicillin (AMOXIL) 500 MG tablet TAKE 4 TABLETS BY MOUTH AS DIRECTED 1 HOUR PRIOR TO DENTAL WORK INCLUDING CLEANINGS 12 tablet 6   aspirin 81 MG tablet Take 1 tablet (81 mg total) by mouth daily. 90 tablet 0   carvedilol (COREG) 6.25 MG tablet Take 1 tablet (6.25 mg total) by mouth 2 (two) times daily. 180 tablet 3   clopidogrel (PLAVIX) 75 MG tablet Take 1 tablet (75 mg total) by mouth daily. 90 tablet 3   diclofenac Sodium (VOLTAREN) 1 % GEL Apply 1 application. topically 4 (four) times daily as needed (pain).     ezetimibe (ZETIA) 10 MG tablet Take 1 tablet (10 mg total) by mouth daily. 90 tablet 3   HUMALOG KWIKPEN 100 UNIT/ML KwikPen Inject 12 Units into the skin 2 (two) times daily with a meal.     LANTUS SOLOSTAR 100 UNIT/ML Solostar Pen Inject 38 Units into the skin at bedtime.     levETIRAcetam (KEPPRA XR) 750 MG 24 hr tablet TAKE 2 TABLETS(1500 MG) BY MOUTH AT BEDTIME 180 tablet 0   lidocaine (LIDODERM) 5 % Place 1 patch onto the skin daily.     losartan (COZAAR) 50 MG tablet Take 1 tablet (50 mg total) by mouth daily. 90 tablet 3   methocarbamol (ROBAXIN) 500 MG tablet Take 1 tablet (500 mg total) by mouth every 8 (eight) hours as needed for up to 40 doses for muscle spasms. 40 tablet 0   Multiple Vitamins-Minerals (MULTIVITAMINS THER. W/MINERALS) TABS Take 1 tablet by mouth daily.       mupirocin ointment (BACTROBAN) 2 % Apply 1 Application topically 3 (three) times daily.     nitroGLYCERIN (NITROSTAT) 0.4 MG SL tablet DISSOLVE 1 TABLET UNDER THE TONGUE EVERY 5 MINUTES AS DIRECTED/AS NEEDED FOR CHEST PAIN 75 tablet 3   pregabalin (LYRICA) 50 MG capsule TAKE 1 CAPSULE BY MOUTH EVERY NIGHT 90 capsule 3   rosuvastatin (CRESTOR) 40 MG tablet TAKE 1 TABLET(40 MG) BY MOUTH DAILY 90 tablet 3   sertraline (ZOLOFT) 100 MG tablet Take 1 tablet (100 mg total) by mouth 2 (two) times daily. 180 tablet 0    torsemide (DEMADEX) 20 MG tablet TAKE 1 TABLET BY MOUTH ONCE DAILY; TAKE EXTRA TABLET AS NEEDED 90 tablet 3   No current facility-administered medications on file prior to visit.    ALLERGIES: Allergies  Allergen Reactions   Metformin Hcl Nausea And Vomiting   Tape Rash    Redness, rash, itchiness    Ace Inhibitors Cough   Actos [Pioglitazone] Swelling   Atorvastatin Other (See Comments)    myalgias   Candesartan Cilexetil Other (See Comments)    Rash "cant recall"    Gabapentin Other (See Comments)    Dizziness and unbalanced  Influenza Vaccines     nearly killed me   Isosorbide Nitrate Other (See Comments)    HA.   Keflex [Cephalexin]      stomach upset   Quinapril Hcl Cough   Tramadol Hcl Other (See Comments)    seziure    FAMILY HISTORY: Family History  Problem Relation Age of Onset   Cancer Mother    Heart disease Father    Diabetes Father    Hyperlipidemia Father    Hypertension Father    Alzheimer's disease Maternal Grandfather     SOCIAL HISTORY: Social History   Socioeconomic History   Marital status: Married    Spouse name: Not on file   Number of children: 2   Years of education: 14   Highest education level: Associate degree: occupational, Scientist, product/process development, or vocational program  Occupational History   Occupation: Disability  Tobacco Use   Smoking status: Never   Smokeless tobacco: Never  Vaping Use   Vaping status: Never Used  Substance and Sexual Activity   Alcohol use: No   Drug use: No   Sexual activity: Not Currently  Other Topics Concern   Not on file  Social History Narrative   Right handed    Lives in a one story home   Drinks caffeine    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   Social Determinants of Health   Financial Resource Strain: Unknown (11/18/2020)   Received from [facility], [facility]   Overall Financial Resource Strain (CARDIA)    Difficulty of Paying Living Expenses: Patient declined  Food Insecurity: No Food  Insecurity (12/31/2021)   Hunger Vital Sign    Worried About Running Out of Food in the Last Year: Never true    Ran Out of Food in the Last Year: Never true  Transportation Needs: No Transportation Needs (12/31/2021)   PRAPARE - Administrator, Civil Service (Medical): No    Lack of Transportation (Non-Medical): No  Physical Activity: Not on file  Stress: Not on file  Social Connections: Not on file  Intimate Partner Violence: Not At Risk (12/31/2021)   Humiliation, Afraid, Rape, and Kick questionnaire    Fear of Current or Ex-Partner: No    Emotionally Abused: No    Physically Abused: No    Sexually Abused: No     PHYSICAL EXAM: Vitals:   12/24/22 1455  BP: (!) 160/76  Pulse: 92  SpO2: 98%   General: No acute distress, on wheelchair Head:  Normocephalic/atraumatic Skin/Extremities: No rash, no edema, s/p bilateral BKA Neurological Exam: alert and awake. No aphasia or dysarthria. Fund of knowledge is appropriate.  Attention and concentration are normal.   Cranial nerves: Pupils equal, round. Extraocular movements intact with no nystagmus. Visual fields full.  No facial asymmetry.  Motor: Bulk and tone normal, muscle strength 5/5 on both UE and proximal LE, s/p bilateral BKA with prosthesis on. No pronator drift.   Finger to nose testing intact.  Gait not tested.    IMPRESSION: This is a 67 yo RH woman with a history of diabetes, hypertension, hyperlipidemia, peripheral vascular disease s/p right carotid endarterectomy, CAD s/p CABG, s/p bilateral BKA, nocturnal convulsion in 2009, and likely tension type headaches. She has been seizure-free since 2015 on Levetiracetam ER 1500mg  at bedtime. She is reporting more phantom limb pain, increase Lyrica to 50mg  BID. This may help with headaches as well. She reports weakness and inability to get out of bed every morning, no  clear weakness today, possibly related to back pain. Referral to home physical therapy will be sent. She  does not drive. Advised to increase hydration. Follow-up in 1 year, call for any changes.    Thank you for allowing me to participate in her care.  Please do not hesitate to call for any questions or concerns.    [name], M.D.   CC: Dr. Tenny Craw

## 2022-12-26 DIAGNOSIS — R062 Wheezing: Secondary | ICD-10-CM | POA: Diagnosis not present

## 2022-12-26 DIAGNOSIS — Z951 Presence of aortocoronary bypass graft: Secondary | ICD-10-CM | POA: Diagnosis not present

## 2022-12-31 ENCOUNTER — Ambulatory Visit: Payer: Medicare Other

## 2022-12-31 ENCOUNTER — Ambulatory Visit (HOSPITAL_COMMUNITY): Payer: Medicare Other

## 2023-01-12 ENCOUNTER — Other Ambulatory Visit: Payer: Self-pay | Admitting: Neurology

## 2023-01-12 DIAGNOSIS — G40209 Localization-related (focal) (partial) symptomatic epilepsy and epileptic syndromes with complex partial seizures, not intractable, without status epilepticus: Secondary | ICD-10-CM

## 2023-01-12 NOTE — Progress Notes (Unsigned)
[facility]   MULTIDISCIPLINARY HEART VALVE CLINIC                                     Cardiology Office Note:    Date:  01/15/2023   ID:  [name], DOB 12/12/1955, MRN [id number]  PCP:  Daisy Floro, MD  [facility] Cardiologist:  Rollene Rotunda, MD / Dr. Excell Seltzer & Dr. Laneta Simmers (TAVR)  [facility] Electrophysiologist:  None   Referring MD: Daisy Floro, MD   1 year s/p TAVR  History of Present Illness:    [name] is a 67 y.o. female with a hx of  CAD s/p CABG 2009, LBBB, obesity, HTN, HLD, PAD s/p bilateral BKAs, DMT2, peripheral neuropathy, CKD stage IIIb, anemia, chronic systolic CHF (EF 30-86%), mitral valve disease and severe AS s/p TAVR (12/30/21) who presents to clinic for follow up.    TEE 07/31/21 showed severe low-flow/low gradient aortic stenosis. Cardiac cath 10/07/21 showed multivessel CAD with total occlusion of the LAD, left circumflex, and right coronary arteries.  She had continued patency of the LIMA to LAD and free left radial graft to obtuse marginal.  Her vein graft to the right coronary artery and diagonal were occluded and there was extensive collateralization of the RCA branches from left to right collaterals. S/p TAVR with a 23 mm Edwards Sapien 3 Ultra Resilia THV via the TF approach on 12/31/21. Post operative echo showed EF 35-40%, normally functioning TAVR with a mean gradient of 7 mmHg and no PVL as well as moderate MAC with mild MR and moderate MS. Hg dropped to 7.1. She was treated with 2U PRBCs. She had a mild transfusion reaction with low grade fever and chills.    She has done well in follow up but continued to have some fatigue. Follow up labs showed worsening anemia. Sent to hematology and started on iron infusions. 1 month follow up echo showed EF 50-55%, normally functioning TAVR with a mean gradient of 11 mm hg and no PVL as well as severe MAC with mild MR and moderate MS.  Today the patient presents to clinic  for follow up. Has some non healing ulcers on her stumps that are painful. She does not follow with vascular. She wears her prosthetic legs only 1-3 hours a day and mostly stays in her wheelchair. No CP or SOB. No LE edema, orthopnea or PND. No dizziness or syncope. No blood in stool or urine. No palpitations. Sometimes has days where she feels poorly.  She is getting ready on a trip to [place] with her husband and sons.  Past Medical History:  Diagnosis Date   Anemia, normocytic normochromic 03/14/2008   Arthritis    "hands"   CAD (coronary artery disease) of artery bypass graft 03/14/2008   Chronic kidney disease (CKD) 12/26/2015   stage 3 due to type 2 diabetes mellitus   Chronic pain syndrome 09/25/2015   Coronary artery disease involving native coronary artery without angina pectoris 09/25/2015   Essential hypertension 03/14/2008   Gastroesophageal reflux disease with esophagitis 07/09/2016   Generalized anxiety disorder with panic attacks    History of one panic attack   GERD (gastroesophageal reflux disease)    takes Zantac   Hyperlipidemia, unspecified 03/14/2008   Localization-related symptomatic epilepsy and epileptic syndromes with complex partial seizures, not intractable, without status epilepticus 06/27/2014   Major depressive disorder 03/14/2008  Neuropathy    Osteomyelitis of ankle or foot, left, acute 06/17/2013   Peripheral vascular disease    PVD (peripheral vascular disease) 03/25/2012   S/P TAVR (transcatheter aortic valve replacement) 12/31/2021   s/p TAVR with a 23 mm Edwards S3UR via the TF approach by Drs Excell Seltzer & Bartle   Type 2 diabetes mellitus with diabetic mononeuropathy, with long-term current use of insulin 03/14/2008    Past Surgical History:  Procedure Laterality Date   AMPUTATION Left 08/22/2013   Procedure: LEFT BELOW KNEE AMPUTATION;  Surgeon: Nadara Mustard, MD;  Location: [facility] OR;  Service: Orthopedics;  Laterality: Left;   ANKLE FUSION Left  05/11/2013   TIBIOCALCANEAL FUSION    ANKLE FUSION Left 05/12/2013   Procedure: LEFT TIBIOCALCANEAL FUSION;  Surgeon: Nadara Mustard, MD;  Location: [facility] OR;  Service: Orthopedics;  Laterality: Left;  Left Tibiocalcaneal Fusion   APPLICATION OF WOUND VAC  06/17/2013   Procedure: APPLICATION OF WOUND VAC;  Surgeon: Nadara Mustard, MD;  Location: [facility] OR;  Service: Orthopedics;;   BELOW KNEE LEG AMPUTATION Right Jan. 2008   BUBBLE STUDY  07/31/2021   Procedure: BUBBLE STUDY;  Surgeon: Sande Rives, MD;  Location: [facility] ENDOSCOPY;  Service: Cardiovascular;;   CAROTID ENDARTERECTOMY Right    CATARACT EXTRACTION Left 12/25/14   CHOLECYSTECTOMY     COLONOSCOPY     COLONOSCOPY N/A 04/24/2021   Procedure: COLONOSCOPY;  Surgeon: Toledo, Boykin Nearing, MD;  Location: [facility] ENDOSCOPY;  Service: Gastroenterology;  Laterality: N/A;   CORONARY ARTERY BYPASS GRAFT  12/01/2007   "CABG X4" (05/12/2013)   DILATION AND CURETTAGE OF UTERUS     I & D EXTREMITY Left 06/17/2013   Procedure: IRRIGATION AND DEBRIDEMENT EXTREMITY, PLACEMENT ANTIBIOTIC STIMULAN BEADS;  Surgeon: Nadara Mustard, MD;  Location: [facility] OR;  Service: Orthopedics;  Laterality: Left;   INTRAMEDULLARY (IM) NAIL INTERTROCHANTERIC Left 08/20/2018   Procedure: INTRAMEDULLARY (IM) NAIL INTERTROCHANTRIC;  Surgeon: Roby Lofts, MD;  Location: [facility] OR;  Service: Orthopedics;  Laterality: Left;   INTRAOPERATIVE TRANSTHORACIC ECHOCARDIOGRAM N/A 12/31/2021   Procedure: INTRAOPERATIVE TRANSTHORACIC ECHOCARDIOGRAM;  Surgeon: Tonny Bollman, MD;  Location: [facility] OR;  Service: Open Heart Surgery;  Laterality: N/A;   IRRIGATION AND DEBRIDEMENT ABSCESS Left 06/17/2013   ANKLE           DR [name]   RIGHT/LEFT HEART CATH AND CORONARY/GRAFT ANGIOGRAPHY N/A 10/07/2021   Procedure: RIGHT/LEFT HEART CATH AND CORONARY/GRAFT ANGIOGRAPHY;  Surgeon: Tonny Bollman, MD;  Location: [facility] INVASIVE CV LAB;  Service: Cardiovascular;  Laterality: N/A;   TEE WITHOUT CARDIOVERSION N/A 07/31/2021    Procedure: TRANSESOPHAGEAL ECHOCARDIOGRAM (TEE);  Surgeon: Sande Rives, MD;  Location: [facility] ENDOSCOPY;  Service: Cardiovascular;  Laterality: N/A;   TOE AMPUTATION Right    "took off a couple toes before the amputation"   TONSILLECTOMY     TRANSCATHETER AORTIC VALVE REPLACEMENT, TRANSFEMORAL Right 12/31/2021   Procedure: Transcatheter Aortic Valve Replacement, Transfemoral;  Surgeon: Tonny Bollman, MD;  Location: [facility] OR;  Service: Open Heart Surgery;  Laterality: Right;    Current Medications: Current Meds  Medication Sig   acetaminophen-codeine (TYLENOL #3) 300-30 MG tablet Take 1 tablet by mouth every 6 (six) hours as needed for severe pain.   albuterol (VENTOLIN HFA) 108 (90 Base) MCG/ACT inhaler Inhale 1-2 puffs into the lungs every 6 (six) hours as needed for wheezing or shortness of breath.    ALPRAZolam (XANAX) 0.5 MG tablet Take 0.5 mg by mouth 2 (two) times daily as needed  for anxiety.   aspirin 81 MG tablet Take 1 tablet (81 mg total) by mouth daily.   carvedilol (COREG) 6.25 MG tablet Take 1 tablet (6.25 mg total) by mouth 2 (two) times daily.   clopidogrel (PLAVIX) 75 MG tablet Take 1 tablet (75 mg total) by mouth daily.   diclofenac Sodium (VOLTAREN) 1 % GEL Apply 1 application. topically 4 (four) times daily as needed (pain).   ezetimibe (ZETIA) 10 MG tablet Take 1 tablet (10 mg total) by mouth daily.   fluconazole (DIFLUCAN) 150 MG tablet Take 1 tablet (150 mg total) by mouth daily. Use as needed for yeast infections   HUMALOG KWIKPEN 100 UNIT/ML KwikPen Inject 12 Units into the skin 2 (two) times daily with a meal.   LANTUS SOLOSTAR 100 UNIT/ML Solostar Pen Inject 38 Units into the skin at bedtime.   levETIRAcetam (KEPPRA XR) 750 MG 24 hr tablet Take 2 tablets every night   lidocaine (LIDODERM) 5 % Place 1 patch onto the skin daily.   losartan (COZAAR) 50 MG tablet Take 1 tablet (50 mg total) by mouth daily.   methocarbamol (ROBAXIN) 500 MG tablet Take 1 tablet (500  mg total) by mouth every 8 (eight) hours as needed for up to 40 doses for muscle spasms.   Multiple Vitamins-Minerals (MULTIVITAMINS THER. W/MINERALS) TABS Take 1 tablet by mouth daily.     mupirocin ointment (BACTROBAN) 2 % Apply 1 Application topically 3 (three) times daily.   nitroGLYCERIN (NITROSTAT) 0.4 MG SL tablet DISSOLVE 1 TABLET UNDER THE TONGUE EVERY 5 MINUTES AS DIRECTED/AS NEEDED FOR CHEST PAIN   pregabalin (LYRICA) 50 MG capsule Take 1 capsule twice a day   rosuvastatin (CRESTOR) 40 MG tablet TAKE 1 TABLET(40 MG) BY MOUTH DAILY   sertraline (ZOLOFT) 100 MG tablet Take 1 tablet (100 mg total) by mouth 2 (two) times daily.   torsemide (DEMADEX) 20 MG tablet TAKE 1 TABLET BY MOUTH ONCE DAILY; TAKE EXTRA TABLET AS NEEDED   [DISCONTINUED] amoxicillin (AMOXIL) 500 MG tablet TAKE 4 TABLETS BY MOUTH AS DIRECTED 1 HOUR PRIOR TO DENTAL WORK INCLUDING CLEANINGS      ROS:   Please see the history of present illness.    All other systems reviewed and are negative.  EKGs   EKG:  EKG is NOT ordered today.    Recent Labs: 01/14/2023: ALT 29; BUN 34; Creatinine, Ser 1.82; Hemoglobin 9.8; Platelets 150; Potassium 5.0; Sodium 138  Recent Lipid Panel    Component Value Date/Time   CHOL 113 08/30/2013 0550   TRIG 149 08/30/2013 0550   HDL 36 (L) 08/30/2013 0550   CHOLHDL 3.1 08/30/2013 0550   VLDL 30 08/30/2013 0550   LDLCALC 47 08/30/2013 0550     Risk Assessment/Calculations:            Physical Exam:    VS:  BP (!) 120/58   Pulse 64   Ht 5\' 5"  (1.651 m)   Wt 170 lb (77.1 kg)   SpO2 92%   BMI 28.29 kg/m     Wt Readings from Last 3 Encounters:  01/14/23 170 lb (77.1 kg)  09/12/22 169 lb (76.7 kg)  03/26/22 176 lb (79.8 kg)     GEN: in wheelchair  NECK: No JVD CARDIAC: RRR, soft apical systolic murmur. No rubs, gallops RESPIRATORY:  Clear to auscultation without rales, wheezing or rhonchi  ABDOMEN: Soft, non-tender, non-distended EXTREMITIES:  No edema; No  deformity. Non healing ulcers on knees (see photo below)  ASSESSMENT:    1. S/P TAVR (transcatheter aortic valve replacement)   2. Essential hypertension   3. Anemia, unspecified type   4. Chronic kidney disease, stage 3b (HCC)   5. PVD (peripheral vascular disease) (HCC)   6. Coronary artery disease involving coronary bypass graft of native heart without angina pectoris   7. Chronic skin ulcer, limited to breakdown of skin (HCC)    PLAN:    In order of problems listed above:  Severe AS s/p TAVR: echo today shows EF 55%, normally functioning TAVR with a mean gradient of 14 mm hg and no PVL as well as severe MAC with mild MR/mod MS. She has NYHA class II symptoms. SBE prophylaxis discussed; I have RX'd amoxicillin.(Allergy to Keflex but just GI upset). I have also Rx'd PRN diflucan in case Abx cause a yeast infection. Continue on chronic Asprin and Plavix. Continue regular follow up with Dr. Antoine Poche.   HTN: BP well controlled today. No changes made.   Chronic anemia: felt to be multifactorial with main components being iron deficiency from GI blood loss exacerbated by ongoing need for antiplatelet therapy and CKD. Now followed by hematology and started on iron infusions. Last hg 9.5. Will check CBC   CKD stage IIIb: last creat 2.15. She is followed by nephrology. Will check CMET.   Chronic combined S/D CHF: appears euvolemic. Continue Coreg 6.25mg  BID, Losartan 50mg  daily, and torsemide 20mg  daily.    PAD s/p bilateral BKA: continue medical therapy with aspirin 81 mg daily and Crestor 40mg  daily.    CAD s/p CABG: pre TAVR cath showed showed multivessel CAD with total occlusion of the LAD, left circumflex, and right coronary arteries.  She had continued patency of the LIMA to LAD and free left radial graft to obtuse marginal.  Her vein graft to the right coronary artery and diagonal were occluded and there was extensive collateralization of the RCA branches from left to right  collaterals. Continue medical therapy with aspirin 81 mg daily and Crestor 40mg  daily.    Non healing ulcers: she has painful ulcers on her stumps (see photo). I have sent a referral into vascular.    Medication Adjustments/Labs and Tests Ordered: Current medicines are reviewed at length with the patient today.  Concerns regarding medicines are outlined above.  Orders Placed This Encounter  Procedures   Comprehensive metabolic panel   CBC   Ambulatory referral to Vascular Surgery   Meds ordered this encounter  Medications   fluconazole (DIFLUCAN) 150 MG tablet    Sig: Take 1 tablet (150 mg total) by mouth daily. Use as needed for yeast infections    Dispense:  5 tablet    Refill:  3   amoxicillin (AMOXIL) 500 MG tablet    Sig: TAKE 4 TABLETS BY MOUTH AS DIRECTED 1 HOUR PRIOR TO DENTAL WORK INCLUDING CLEANINGS    Dispense:  12 tablet    Refill:  6    Patient Instructions  Medication Instructions:  Your physician recommends that you continue on your current medications as directed. Please refer to the Current Medication list given to you today.  *If you need a refill on your cardiac medications before your next appointment, please call your pharmacy*   Lab Work: CMET, CBC -Today   If you have labs (blood work) drawn today and your tests are completely normal, you will receive your results only by: MyChart Message (if you have MyChart) OR A paper copy in the mail If you have any lab  test that is abnormal or we need to change your treatment, we will call you to review the results.   Testing/Procedures: None ordered    Follow-Up: Follow up with Dr. Rollene Rotunda in March [id number]  Other Instructions Your physician has referred you to Vein and Vascular Surgery    Signed, Cline Crock, PA-C  01/15/2023 9:20 AM    [facility]

## 2023-01-14 ENCOUNTER — Ambulatory Visit: Payer: Medicare Other | Attending: Cardiology

## 2023-01-14 ENCOUNTER — Ambulatory Visit: Payer: Medicare Other

## 2023-01-14 VITALS — BP 120/58 | HR 64 | Ht 65.0 in | Wt 170.0 lb

## 2023-01-14 DIAGNOSIS — I25810 Atherosclerosis of coronary artery bypass graft(s) without angina pectoris: Secondary | ICD-10-CM

## 2023-01-14 DIAGNOSIS — Z952 Presence of prosthetic heart valve: Secondary | ICD-10-CM | POA: Insufficient documentation

## 2023-01-14 DIAGNOSIS — D649 Anemia, unspecified: Secondary | ICD-10-CM | POA: Insufficient documentation

## 2023-01-14 DIAGNOSIS — I10 Essential (primary) hypertension: Secondary | ICD-10-CM | POA: Insufficient documentation

## 2023-01-14 DIAGNOSIS — N1832 Chronic kidney disease, stage 3b: Secondary | ICD-10-CM

## 2023-01-14 DIAGNOSIS — L98491 Non-pressure chronic ulcer of skin of other sites limited to breakdown of skin: Secondary | ICD-10-CM | POA: Insufficient documentation

## 2023-01-14 DIAGNOSIS — I739 Peripheral vascular disease, unspecified: Secondary | ICD-10-CM

## 2023-01-14 DIAGNOSIS — I350 Nonrheumatic aortic (valve) stenosis: Secondary | ICD-10-CM | POA: Insufficient documentation

## 2023-01-14 LAB — ECHOCARDIOGRAM COMPLETE
AR max vel: 1.46 cm2
AV Area VTI: 1.45 cm2
AV Area mean vel: 1.48 cm2
AV Mean grad: 14 mm[Hg]
AV Peak grad: 25.6 mm[Hg]
Ao pk vel: 2.53 m/s
Area-P 1/2: 2.62 cm2
MV VTI: 1.62 cm2
S' Lateral: 3.15 cm

## 2023-01-14 MED ORDER — AMOXICILLIN 500 MG PO TABS: ORAL_TABLET | ORAL | 6 refills | Status: DC

## 2023-01-14 MED ORDER — PERFLUTREN LIPID MICROSPHERE
1.0000 mL | INTRAVENOUS | Status: AC | PRN
Start: 2023-01-14 — End: 2023-01-14
  Administered 2023-01-14: 2 mL via INTRAVENOUS

## 2023-01-14 MED ORDER — FLUCONAZOLE 150 MG PO TABS: 150.0000 mg | ORAL_TABLET | Freq: Every day | ORAL | 3 refills | Status: DC

## 2023-01-14 NOTE — Patient Instructions (Signed)
Medication Instructions:  Your physician recommends that you continue on your current medications as directed. Please refer to the Current Medication list given to you today.  *If you need a refill on your cardiac medications before your next appointment, please call your pharmacy*   Lab Work: CMET, CBC -Today   If you have labs (blood work) drawn today and your tests are completely normal, you will receive your results only by: MyChart Message (if you have MyChart) OR A paper copy in the mail If you have any lab test that is abnormal or we need to change your treatment, we will call you to review the results.   Testing/Procedures: None ordered    Follow-Up: Follow up with Dr. Rollene Rotunda in March [id number]  Other Instructions Your physician has referred you to Vein and Vascular Surgery

## 2023-01-15 LAB — COMPREHENSIVE METABOLIC PANEL
ALT: 29 [IU]/L (ref 0–32)
AST: 26 [IU]/L (ref 0–40)
Albumin: 3.8 g/dL — ABNORMAL LOW (ref 3.9–4.9)
Alkaline Phosphatase: 79 [IU]/L (ref 44–121)
BUN/Creatinine Ratio: 19 (ref 12–28)
BUN: 34 mg/dL — ABNORMAL HIGH (ref 8–27)
Bilirubin Total: 0.4 mg/dL (ref 0.0–1.2)
CO2: 24 mmol/L (ref 20–29)
Calcium: 8.7 mg/dL (ref 8.7–10.3)
Chloride: 98 mmol/L (ref 96–106)
Creatinine, Ser: 1.82 mg/dL — ABNORMAL HIGH (ref 0.57–1.00)
Globulin, Total: 2.4 g/dL (ref 1.5–4.5)
Glucose: 218 mg/dL — ABNORMAL HIGH (ref 70–99)
Potassium: 5 mmol/L (ref 3.5–5.2)
Sodium: 138 mmol/L (ref 134–144)
Total Protein: 6.2 g/dL (ref 6.0–8.5)
eGFR: 30 mL/min/{1.73_m2} — ABNORMAL LOW (ref >59)

## 2023-01-15 LAB — CBC
Hematocrit: 31.9 % — ABNORMAL LOW (ref 34.0–46.6)
Hemoglobin: 9.8 g/dL — ABNORMAL LOW (ref 11.1–15.9)
MCH: 28.7 pg (ref 26.6–33.0)
MCHC: 30.7 g/dL — ABNORMAL LOW (ref 31.5–35.7)
MCV: 94 fL (ref 79–97)
Platelets: 150 10*3/uL (ref 150–450)
RBC: 3.41 x10E6/uL — ABNORMAL LOW (ref 3.77–5.28)
RDW: 14.8 % (ref 11.7–15.4)
WBC: 4.2 10*3/uL (ref 3.4–10.8)

## 2023-01-20 DIAGNOSIS — I10 Essential (primary) hypertension: Secondary | ICD-10-CM | POA: Diagnosis not present

## 2023-01-20 DIAGNOSIS — N184 Chronic kidney disease, stage 4 (severe): Secondary | ICD-10-CM | POA: Diagnosis not present

## 2023-01-20 DIAGNOSIS — E559 Vitamin D deficiency, unspecified: Secondary | ICD-10-CM | POA: Diagnosis not present

## 2023-01-20 DIAGNOSIS — D649 Anemia, unspecified: Secondary | ICD-10-CM | POA: Diagnosis not present

## 2023-01-20 DIAGNOSIS — Z89512 Acquired absence of left leg below knee: Secondary | ICD-10-CM | POA: Diagnosis not present

## 2023-01-20 DIAGNOSIS — I2510 Atherosclerotic heart disease of native coronary artery without angina pectoris: Secondary | ICD-10-CM | POA: Diagnosis not present

## 2023-01-20 DIAGNOSIS — Z89511 Acquired absence of right leg below knee: Secondary | ICD-10-CM | POA: Diagnosis not present

## 2023-01-20 DIAGNOSIS — E1165 Type 2 diabetes mellitus with hyperglycemia: Secondary | ICD-10-CM | POA: Diagnosis not present

## 2023-01-20 DIAGNOSIS — I509 Heart failure, unspecified: Secondary | ICD-10-CM | POA: Diagnosis not present

## 2023-01-21 ENCOUNTER — Telehealth: Payer: Self-pay | Admitting: Neurology

## 2023-01-21 DIAGNOSIS — G40209 Localization-related (focal) (partial) symptomatic epilepsy and epileptic syndromes with complex partial seizures, not intractable, without status epilepticus: Secondary | ICD-10-CM

## 2023-01-21 NOTE — Telephone Encounter (Signed)
Pt called to see if she has called her pharmacy about refills, looks like RX sent in October with refills, trying to find home health that will go to her house.

## 2023-01-21 NOTE — Telephone Encounter (Signed)
1. Which medications need refilled? (List name and dosage, if known) Keppra  2. Which pharmacy/location is medication to be sent to? (include street and city if local pharmacy) [facility] [place]  Pt also states she has not heard from anyone to schedule PT

## 2023-01-22 MED ORDER — LEVETIRACETAM ER 750 MG PO TB24: ORAL_TABLET | ORAL | 0 refills | Status: DC

## 2023-01-22 NOTE — Telephone Encounter (Signed)
Pt called to see if she has called her pharmacy about refills, looks like RX sent in October with refills, trying to find home health that will go to her house.

## 2023-01-26 ENCOUNTER — Inpatient Hospital Stay: Payer: Medicare Other | Admitting: Oncology

## 2023-01-26 ENCOUNTER — Inpatient Hospital Stay: Payer: Medicare Other | Attending: Oncology

## 2023-01-26 ENCOUNTER — Other Ambulatory Visit: Payer: Self-pay

## 2023-01-26 DIAGNOSIS — I7143 Infrarenal abdominal aortic aneurysm, without rupture: Secondary | ICD-10-CM

## 2023-01-27 ENCOUNTER — Telehealth: Payer: Self-pay | Admitting: Oncology

## 2023-01-27 NOTE — Telephone Encounter (Signed)
Contacted pt to reschedule missed appointments. Unable to reach via phone, vm is full.

## 2023-01-29 NOTE — Telephone Encounter (Signed)
Contacted pt to schedule an appt. Unable to reach via phone, voicemail is full.

## 2023-02-02 NOTE — Telephone Encounter (Signed)
Contacted pt to schedule an appt. Unable to reach via phone, voicemail was left.

## 2023-02-05 ENCOUNTER — Encounter: Payer: Self-pay | Admitting: Oncology

## 2023-02-09 NOTE — Progress Notes (Deleted)
[facility] [place]  ASSESSMENT / PLAN: [name] is a 67 y.o. female with atherosclerosis of *** native arteries of *** causing {Chronic PAD levels:25303}.  Patient counseled {pad risk2:26283}  WIfI score calculated based on clinical exam and non-invasive measurements. {WIFIvascular:26096}  Recommend:  Abstinence from all tobacco products. Blood glucose control with goal A1c < 7%. Blood pressure control with goal blood pressure < 140/90 mmHg. Lipid reduction therapy with goal LDL-C <100 mg/dL (***<47 if symptomatic from PAD).  Aspirin 81mg  PO QD.  *** Clopidogrel 75mg  PO QD. *** Rivaroxaban 2.5mg  PO BID. *** Cilostozal 100mg  PO BID for intermittent claudication without evidence of heart failure. Atorvastatin 40-80mg  PO QD (or other "high intensity" statin therapy). *** Daily walking to and past the point of discomfort. Patient counseled to keep a log of exercise distance. *** Adequate hydration (at least 2 liters / day) if patient's heart and kidney function is adequate.  Plan *** lower extremity angiogram with possible intervention via *** approach in cath lab ***.    CHIEF COMPLAINT: ***  HISTORY OF PRESENT ILLNESS: [name] is a 67 y.o. female ***  VASCULAR SURGICAL HISTORY: ***  VASCULAR RISK FACTORS: {FINDINGS; POSITIVE [phone]} history of stroke / transient ischemic attack. {FINDINGS; POSITIVE [phone]} history of coronary artery disease. *** history of PCI. *** history of CABG.  {FINDINGS; POSITIVE [phone]} history of diabetes mellitus. Last A1c ***. {FINDINGS; POSITIVE [phone]} history of smoking. *** actively smoking. {FINDINGS; POSITIVE [phone]} history of hypertension. *** drug regimen with *** control. {FINDINGS; POSITIVE [phone]} history of chronic kidney disease.  Last GFR ***. CKD {stage:30421363}. {FINDINGS; POSITIVE [phone]} history of  chronic obstructive pulmonary disease, treated with ***.  FUNCTIONAL STATUS: ECOG performance status: {findings; ecog performance status:31780} Ambulatory status: {TNHAmbulation:25868}  CAREY 1 AND 3 YEAR INDEX Female (2pts) 75-79 or 80-84 (2pts) >84 (3pts) Dependence in toileting (1pt) Partial or full dependence in dressing (1pt) History of malignant neoplasm (2pts) CHF (3pts) COPD (1pts) CKD (3pts)  0-3 pts 6% 1 year mortality ; 21% 3 year mortality 4-5 pts 12% 1 year mortality ; 36% 3 year mortality >5 pts 21% 1 year mortality; 54% 3 year mortality   Past Medical History:  Diagnosis Date   Anemia, normocytic normochromic 03/14/2008   Arthritis    "hands"   CAD (coronary artery disease) of artery bypass graft 03/14/2008   Chronic kidney disease (CKD) 12/26/2015   stage 3 due to type 2 diabetes mellitus   Chronic pain syndrome 09/25/2015   Coronary artery disease involving native coronary artery without angina pectoris 09/25/2015   Essential hypertension 03/14/2008   Gastroesophageal reflux disease with esophagitis 07/09/2016   Generalized anxiety disorder with panic attacks    History of one panic attack   GERD (gastroesophageal reflux disease)    takes Zantac   Hyperlipidemia, unspecified 03/14/2008   Localization-related symptomatic epilepsy and epileptic syndromes with complex partial seizures, not intractable, without status epilepticus 06/27/2014   Major depressive disorder 03/14/2008   Neuropathy    Osteomyelitis of ankle or foot, left, acute 06/17/2013   Peripheral vascular disease    PVD (peripheral vascular disease) 03/25/2012   S/P TAVR (transcatheter aortic valve replacement) 12/31/2021   s/p TAVR with a 23 mm Edwards S3UR via the TF approach by Drs Excell Seltzer & Bartle   Type 2 diabetes mellitus with diabetic mononeuropathy, with long-term current use of insulin 03/14/2008    Past Surgical History:  Procedure Laterality Date   AMPUTATION Left 08/22/2013  Procedure: LEFT BELOW KNEE AMPUTATION;  Surgeon: Nadara Mustard, MD;  Location: [facility] OR;  Service: Orthopedics;  Laterality: Left;   ANKLE FUSION Left 05/11/2013   TIBIOCALCANEAL FUSION    ANKLE FUSION Left 05/12/2013   Procedure: LEFT TIBIOCALCANEAL FUSION;  Surgeon: Nadara Mustard, MD;  Location: [facility] OR;  Service: Orthopedics;  Laterality: Left;  Left Tibiocalcaneal Fusion   APPLICATION OF WOUND VAC  06/17/2013   Procedure: APPLICATION OF WOUND VAC;  Surgeon: Nadara Mustard, MD;  Location: [facility] OR;  Service: Orthopedics;;   BELOW KNEE LEG AMPUTATION Right Jan. 2008   BUBBLE STUDY  07/31/2021   Procedure: BUBBLE STUDY;  Surgeon: Sande Rives, MD;  Location: [facility] ENDOSCOPY;  Service: Cardiovascular;;   CAROTID ENDARTERECTOMY Right    CATARACT EXTRACTION Left 12/25/14   CHOLECYSTECTOMY     COLONOSCOPY     COLONOSCOPY N/A 04/24/2021   Procedure: COLONOSCOPY;  Surgeon: Toledo, Boykin Nearing, MD;  Location: [facility] ENDOSCOPY;  Service: Gastroenterology;  Laterality: N/A;   CORONARY ARTERY BYPASS GRAFT  12/01/2007   "CABG X4" (05/12/2013)   DILATION AND CURETTAGE OF UTERUS     I & D EXTREMITY Left 06/17/2013   Procedure: IRRIGATION AND DEBRIDEMENT EXTREMITY, PLACEMENT ANTIBIOTIC STIMULAN BEADS;  Surgeon: Nadara Mustard, MD;  Location: [facility] OR;  Service: Orthopedics;  Laterality: Left;   INTRAMEDULLARY (IM) NAIL INTERTROCHANTERIC Left 08/20/2018   Procedure: INTRAMEDULLARY (IM) NAIL INTERTROCHANTRIC;  Surgeon: Roby Lofts, MD;  Location: [facility] OR;  Service: Orthopedics;  Laterality: Left;   INTRAOPERATIVE TRANSTHORACIC ECHOCARDIOGRAM N/A 12/31/2021   Procedure: INTRAOPERATIVE TRANSTHORACIC ECHOCARDIOGRAM;  Surgeon: Tonny Bollman, MD;  Location: [facility] OR;  Service: Open Heart Surgery;  Laterality: N/A;   IRRIGATION AND DEBRIDEMENT ABSCESS Left 06/17/2013   ANKLE           DR [name]   RIGHT/LEFT HEART CATH AND CORONARY/GRAFT ANGIOGRAPHY N/A 10/07/2021   Procedure: RIGHT/LEFT HEART CATH AND CORONARY/GRAFT ANGIOGRAPHY;   Surgeon: Tonny Bollman, MD;  Location: [facility] INVASIVE CV LAB;  Service: Cardiovascular;  Laterality: N/A;   TEE WITHOUT CARDIOVERSION N/A 07/31/2021   Procedure: TRANSESOPHAGEAL ECHOCARDIOGRAM (TEE);  Surgeon: Sande Rives, MD;  Location: [facility] ENDOSCOPY;  Service: Cardiovascular;  Laterality: N/A;   TOE AMPUTATION Right    "took off a couple toes before the amputation"   TONSILLECTOMY     TRANSCATHETER AORTIC VALVE REPLACEMENT, TRANSFEMORAL Right 12/31/2021   Procedure: Transcatheter Aortic Valve Replacement, Transfemoral;  Surgeon: Tonny Bollman, MD;  Location: [facility] OR;  Service: Open Heart Surgery;  Laterality: Right;    Family History  Problem Relation Age of Onset   Cancer Mother    Heart disease Father    Diabetes Father    Hyperlipidemia Father    Hypertension Father    Alzheimer's disease Maternal Grandfather     Social History   Socioeconomic History   Marital status: Married    Spouse name: Not on file   Number of children: 2   Years of education: 14   Highest education level: Associate degree: occupational, Scientist, product/process development, or vocational program  Occupational History   Occupation: Disability  Tobacco Use   Smoking status: Never   Smokeless tobacco: Never  Vaping Use   Vaping status: Never Used  Substance and Sexual Activity   Alcohol use: No   Drug use: No   Sexual activity: Not Currently  Other Topics Concern   Not on file  Social History Narrative   Right handed    Lives in a one story home  Drinks caffeine    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   Social Determinants of Health   Financial Resource Strain: Unknown (11/18/2020)   Received [facility]   Overall Financial Resource Strain (CARDIA)    Difficulty of Paying Living Expenses: Patient declined  Food Insecurity: No Food Insecurity (12/31/2021)   Hunger Vital Sign    Worried About Running Out of Food in the Last Year: Never true    Ran Out of Food in the Last Year: Never  true  Transportation Needs: No Transportation Needs (12/31/2021)   PRAPARE - Administrator, Civil Service (Medical): No    Lack of Transportation (Non-Medical): No  Physical Activity: Not on file  Stress: Not on file  Social Connections: Not on file  Intimate Partner Violence: Not At Risk (12/31/2021)   Humiliation, Afraid, Rape, and Kick questionnaire    Fear of Current or Ex-Partner: No    Emotionally Abused: No    Physically Abused: No    Sexually Abused: No    Allergies  Allergen Reactions   Metformin Hcl Nausea And Vomiting   Tape Rash    Redness, rash, itchiness    Ace Inhibitors Cough   Actos [Pioglitazone] Swelling   Atorvastatin Other (See Comments)    myalgias   Candesartan Cilexetil Other (See Comments)    Rash "cant recall"    Gabapentin Other (See Comments)    Dizziness and unbalanced   Influenza Vaccines     nearly killed me   Isosorbide Nitrate Other (See Comments)    HA.   Keflex [Cephalexin]      stomach upset   Quinapril Hcl Cough   Tramadol Hcl Other (See Comments)    seziure    Current Outpatient Medications  Medication Sig Dispense Refill   acetaminophen-codeine (TYLENOL #3) 300-30 MG tablet Take 1 tablet by mouth every 6 (six) hours as needed for severe pain.     albuterol (VENTOLIN HFA) 108 (90 Base) MCG/ACT inhaler Inhale 1-2 puffs into the lungs every 6 (six) hours as needed for wheezing or shortness of breath.      ALPRAZolam (XANAX) 0.5 MG tablet Take 0.5 mg by mouth 2 (two) times daily as needed for anxiety.     amoxicillin (AMOXIL) 500 MG tablet TAKE 4 TABLETS BY MOUTH AS DIRECTED 1 HOUR PRIOR TO DENTAL WORK INCLUDING CLEANINGS 12 tablet 6   aspirin 81 MG tablet Take 1 tablet (81 mg total) by mouth daily. 90 tablet 0   carvedilol (COREG) 6.25 MG tablet Take 1 tablet (6.25 mg total) by mouth 2 (two) times daily. 180 tablet 3   clopidogrel (PLAVIX) 75 MG tablet Take 1 tablet (75 mg total) by mouth daily. 90 tablet 3   diclofenac  Sodium (VOLTAREN) 1 % GEL Apply 1 application. topically 4 (four) times daily as needed (pain).     ezetimibe (ZETIA) 10 MG tablet Take 1 tablet (10 mg total) by mouth daily. 90 tablet 3   fluconazole (DIFLUCAN) 150 MG tablet Take 1 tablet (150 mg total) by mouth daily. Use as needed for yeast infections 5 tablet 3   HUMALOG KWIKPEN 100 UNIT/ML KwikPen Inject 12 Units into the skin 2 (two) times daily with a meal.     LANTUS SOLOSTAR 100 UNIT/ML Solostar Pen Inject 38 Units into the skin at bedtime.     levETIRAcetam (KEPPRA XR) 750 MG 24 hr tablet Take 2 tablets every night 180 tablet 0  lidocaine (LIDODERM) 5 % Place 1 patch onto the skin daily.     losartan (COZAAR) 50 MG tablet Take 1 tablet (50 mg total) by mouth daily. 90 tablet 3   methocarbamol (ROBAXIN) 500 MG tablet Take 1 tablet (500 mg total) by mouth every 8 (eight) hours as needed for up to 40 doses for muscle spasms. 40 tablet 0   Multiple Vitamins-Minerals (MULTIVITAMINS THER. W/MINERALS) TABS Take 1 tablet by mouth daily.       mupirocin ointment (BACTROBAN) 2 % Apply 1 Application topically 3 (three) times daily.     nitroGLYCERIN (NITROSTAT) 0.4 MG SL tablet DISSOLVE 1 TABLET UNDER THE TONGUE EVERY 5 MINUTES AS DIRECTED/AS NEEDED FOR CHEST PAIN 75 tablet 3   pregabalin (LYRICA) 50 MG capsule Take 1 capsule twice a day 180 capsule 4   rosuvastatin (CRESTOR) 40 MG tablet TAKE 1 TABLET(40 MG) BY MOUTH DAILY 90 tablet 3   sertraline (ZOLOFT) 100 MG tablet Take 1 tablet (100 mg total) by mouth 2 (two) times daily. 180 tablet 0   torsemide (DEMADEX) 20 MG tablet TAKE 1 TABLET BY MOUTH ONCE DAILY; TAKE EXTRA TABLET AS NEEDED 90 tablet 3   No current facility-administered medications for this visit.    PHYSICAL EXAM There were no vitals filed for this visit.  Constitutional: *** appearing. *** distress. Appears *** nourished.  Neurologic: CN ***. *** focal findings. *** sensory loss. Psychiatric: *** Mood and affect symmetric and  appropriate. Eyes: *** No icterus. No conjunctival pallor. Ears, nose, throat: *** mucous membranes moist. Midline trachea.  Cardiac: *** rate and rhythm.  Respiratory: *** unlabored. Abdominal: *** soft, non-tender, non-distended.  Peripheral vascular: *** Extremity: *** edema. *** cyanosis. *** pallor.  Skin: *** gangrene. *** ulceration.  Lymphatic: *** Stemmer's sign. *** palpable lymphadenopathy.    PERTINENT LABORATORY AND RADIOLOGIC DATA  Most recent CBC    Latest Ref Rng & Units 01/14/2023    4:02 PM 09/24/2022    3:52 PM 06/19/2022    3:36 PM  CBC  WBC 3.4 - 10.8 x10E3/uL 4.2  5.6  4.3   Hemoglobin 11.1 - 15.9 g/dL 9.8  9.5  78.2   Hematocrit 34.0 - 46.6 % 31.9  29.7  31.3   Platelets 150 - 450 x10E3/uL 150  180  189      Most recent CMP    Latest Ref Rng & Units 01/14/2023    4:02 PM 09/24/2022    3:52 PM 06/19/2022    3:36 PM  CMP  Glucose 70 - 99 mg/dL 956  213  086   BUN 8 - 27 mg/dL 34  32  32   Creatinine 0.57 - 1.00 mg/dL 5.78  4.69  6.29   Sodium 134 - 144 mmol/L 138  135  136   Potassium 3.5 - 5.2 mmol/L 5.0  3.9  3.6   Chloride 96 - 106 mmol/L 98  103  98   CO2 20 - 29 mmol/L 24  23  28    Calcium 8.7 - 10.3 mg/dL 8.7  8.8  9.0   Total Protein 6.0 - 8.5 g/dL 6.2  7.1    Total Bilirubin 0.0 - 1.2 mg/dL 0.4  0.4    Alkaline Phos 44 - 121 IU/L 79  59    AST 0 - 40 IU/L 26  24    ALT 0 - 32 IU/L 29  23      Renal function CrCl cannot be calculated (Patient's most recent lab result is  older than the maximum 21 days allowed.).  Hgb A1c MFr Bld (%)  Date Value  12/27/2021 9.6 (H)    LDL Cholesterol  Date Value Ref Range Status  08/30/2013 47 0 - 99 mg/dL Final    Comment:           Total Cholesterol/HDL:CHD Risk Coronary Heart Disease Risk Table                     Men   Women  1/2 Average Risk   3.4   3.3  Average Risk       5.0   4.4  2 X Average Risk   9.6   7.1  3 X Average Risk  23.4   11.0        Use the calculated Patient Ratio above  and the CHD Risk Table to determine the patient's CHD Risk.        ATP III CLASSIFICATION (LDL):  <100     mg/dL   Optimal  213-086  mg/dL   Near or Above                    Optimal  130-159  mg/dL   Borderline  578-469  mg/dL   High  >629     mg/dL   Very High     Vascular Imaging: ***  Kasen Sako N. Lenell Antu, MD [facility] Phone Number: [phone] 02/09/2023 12:18 PM   Total time spent on preparing this encounter including chart review, data review, collecting history, examining the patient, coordinating care for this {tnhtimebilling:26202}  Portions of this report may have been transcribed using voice recognition software.  Every effort has been made to ensure accuracy; however, inadvertent computerized transcription errors may still be present.

## 2023-02-10 ENCOUNTER — Encounter: Payer: Medicare Other | Admitting: Vascular Surgery

## 2023-02-10 ENCOUNTER — Ambulatory Visit (HOSPITAL_COMMUNITY): Payer: Medicare Other

## 2023-03-13 DIAGNOSIS — E1165 Type 2 diabetes mellitus with hyperglycemia: Secondary | ICD-10-CM | POA: Diagnosis not present

## 2023-03-27 ENCOUNTER — Ambulatory Visit (HOSPITAL_COMMUNITY): Payer: Medicare Other

## 2023-03-27 ENCOUNTER — Encounter: Payer: Medicare Other | Admitting: Vascular Surgery

## 2023-04-02 DIAGNOSIS — Z89511 Acquired absence of right leg below knee: Secondary | ICD-10-CM | POA: Diagnosis not present

## 2023-04-02 DIAGNOSIS — G546 Phantom limb syndrome with pain: Secondary | ICD-10-CM | POA: Diagnosis not present

## 2023-04-02 DIAGNOSIS — R300 Dysuria: Secondary | ICD-10-CM | POA: Diagnosis not present

## 2023-04-02 DIAGNOSIS — M549 Dorsalgia, unspecified: Secondary | ICD-10-CM | POA: Diagnosis not present

## 2023-04-02 DIAGNOSIS — L98491 Non-pressure chronic ulcer of skin of other sites limited to breakdown of skin: Secondary | ICD-10-CM | POA: Diagnosis not present

## 2023-04-02 DIAGNOSIS — Z0000 Encounter for general adult medical examination without abnormal findings: Secondary | ICD-10-CM | POA: Diagnosis not present

## 2023-04-02 DIAGNOSIS — Z89512 Acquired absence of left leg below knee: Secondary | ICD-10-CM | POA: Diagnosis not present

## 2023-04-02 DIAGNOSIS — E1151 Type 2 diabetes mellitus with diabetic peripheral angiopathy without gangrene: Secondary | ICD-10-CM | POA: Diagnosis not present

## 2023-04-08 ENCOUNTER — Other Ambulatory Visit: Payer: Self-pay | Admitting: Physician Assistant

## 2023-04-08 DIAGNOSIS — G40209 Localization-related (focal) (partial) symptomatic epilepsy and epileptic syndromes with complex partial seizures, not intractable, without status epilepticus: Secondary | ICD-10-CM

## 2023-04-13 ENCOUNTER — Other Ambulatory Visit: Payer: Self-pay | Admitting: Neurology

## 2023-04-13 ENCOUNTER — Other Ambulatory Visit: Payer: Self-pay | Admitting: Physician Assistant

## 2023-04-13 DIAGNOSIS — Z952 Presence of prosthetic heart valve: Secondary | ICD-10-CM

## 2023-04-13 DIAGNOSIS — G546 Phantom limb syndrome with pain: Secondary | ICD-10-CM

## 2023-04-22 DIAGNOSIS — E1151 Type 2 diabetes mellitus with diabetic peripheral angiopathy without gangrene: Secondary | ICD-10-CM | POA: Diagnosis not present

## 2023-04-22 DIAGNOSIS — E7800 Pure hypercholesterolemia, unspecified: Secondary | ICD-10-CM | POA: Diagnosis not present

## 2023-04-22 DIAGNOSIS — N184 Chronic kidney disease, stage 4 (severe): Secondary | ICD-10-CM | POA: Diagnosis not present

## 2023-04-22 DIAGNOSIS — I509 Heart failure, unspecified: Secondary | ICD-10-CM | POA: Diagnosis not present

## 2023-04-22 DIAGNOSIS — I2510 Atherosclerotic heart disease of native coronary artery without angina pectoris: Secondary | ICD-10-CM | POA: Diagnosis not present

## 2023-04-22 DIAGNOSIS — E1165 Type 2 diabetes mellitus with hyperglycemia: Secondary | ICD-10-CM | POA: Diagnosis not present

## 2023-04-22 DIAGNOSIS — I10 Essential (primary) hypertension: Secondary | ICD-10-CM | POA: Diagnosis not present

## 2023-05-19 DIAGNOSIS — H52223 Regular astigmatism, bilateral: Secondary | ICD-10-CM | POA: Diagnosis not present

## 2023-05-19 DIAGNOSIS — E119 Type 2 diabetes mellitus without complications: Secondary | ICD-10-CM | POA: Diagnosis not present

## 2023-05-19 DIAGNOSIS — H18423 Band keratopathy, bilateral: Secondary | ICD-10-CM | POA: Diagnosis not present

## 2023-05-19 DIAGNOSIS — H1711 Central corneal opacity, right eye: Secondary | ICD-10-CM | POA: Diagnosis not present

## 2023-06-13 ENCOUNTER — Other Ambulatory Visit: Payer: Self-pay | Admitting: Cardiology

## 2023-06-23 ENCOUNTER — Other Ambulatory Visit: Payer: Self-pay | Admitting: Physician Assistant

## 2023-06-23 DIAGNOSIS — Z952 Presence of prosthetic heart valve: Secondary | ICD-10-CM

## 2023-07-10 DIAGNOSIS — Z89512 Acquired absence of left leg below knee: Secondary | ICD-10-CM | POA: Diagnosis not present

## 2023-07-10 DIAGNOSIS — Z89511 Acquired absence of right leg below knee: Secondary | ICD-10-CM | POA: Diagnosis not present

## 2023-07-13 ENCOUNTER — Other Ambulatory Visit (HOSPITAL_COMMUNITY): Payer: Self-pay | Admitting: Family Medicine

## 2023-07-13 DIAGNOSIS — Z7901 Long term (current) use of anticoagulants: Secondary | ICD-10-CM

## 2023-07-13 DIAGNOSIS — Z9181 History of falling: Secondary | ICD-10-CM | POA: Diagnosis not present

## 2023-07-13 DIAGNOSIS — S0993XA Unspecified injury of face, initial encounter: Secondary | ICD-10-CM | POA: Diagnosis not present

## 2023-07-13 DIAGNOSIS — I10 Essential (primary) hypertension: Secondary | ICD-10-CM | POA: Diagnosis not present

## 2023-07-13 DIAGNOSIS — R350 Frequency of micturition: Secondary | ICD-10-CM | POA: Diagnosis not present

## 2023-07-14 ENCOUNTER — Ambulatory Visit (HOSPITAL_BASED_OUTPATIENT_CLINIC_OR_DEPARTMENT_OTHER)
Admission: RE | Admit: 2023-07-14 | Discharge: 2023-07-14 | Disposition: A | Discharge status: Home / Self Care | Source: Ambulatory Visit | Attending: Family Medicine | Admitting: Family Medicine

## 2023-07-14 DIAGNOSIS — Z9181 History of falling: Secondary | ICD-10-CM | POA: Diagnosis present

## 2023-07-14 DIAGNOSIS — Z7901 Long term (current) use of anticoagulants: Secondary | ICD-10-CM | POA: Diagnosis not present

## 2023-07-14 DIAGNOSIS — I6782 Cerebral ischemia: Secondary | ICD-10-CM | POA: Diagnosis not present

## 2023-07-14 DIAGNOSIS — W19XXXA Unspecified fall, initial encounter: Secondary | ICD-10-CM | POA: Diagnosis not present

## 2023-07-14 DIAGNOSIS — S0993XA Unspecified injury of face, initial encounter: Secondary | ICD-10-CM | POA: Insufficient documentation

## 2023-07-14 DIAGNOSIS — S0990XA Unspecified injury of head, initial encounter: Secondary | ICD-10-CM | POA: Diagnosis present

## 2023-07-21 DIAGNOSIS — M549 Dorsalgia, unspecified: Secondary | ICD-10-CM | POA: Diagnosis not present

## 2023-07-23 DIAGNOSIS — M549 Dorsalgia, unspecified: Secondary | ICD-10-CM | POA: Diagnosis not present

## 2023-07-23 DIAGNOSIS — I509 Heart failure, unspecified: Secondary | ICD-10-CM | POA: Diagnosis not present

## 2023-07-23 DIAGNOSIS — Z89511 Acquired absence of right leg below knee: Secondary | ICD-10-CM | POA: Diagnosis not present

## 2023-08-06 ENCOUNTER — Encounter: Payer: Self-pay | Admitting: Cardiology

## 2023-08-07 ENCOUNTER — Other Ambulatory Visit: Payer: Self-pay | Admitting: Cardiology

## 2023-08-11 ENCOUNTER — Other Ambulatory Visit: Payer: Self-pay | Admitting: Neurology

## 2023-08-11 DIAGNOSIS — G40209 Localization-related (focal) (partial) symptomatic epilepsy and epileptic syndromes with complex partial seizures, not intractable, without status epilepticus: Secondary | ICD-10-CM

## 2023-08-13 ENCOUNTER — Other Ambulatory Visit: Payer: Self-pay | Admitting: Physician Assistant

## 2023-08-14 DIAGNOSIS — E1142 Type 2 diabetes mellitus with diabetic polyneuropathy: Secondary | ICD-10-CM | POA: Diagnosis not present

## 2023-08-14 DIAGNOSIS — Z794 Long term (current) use of insulin: Secondary | ICD-10-CM | POA: Diagnosis not present

## 2023-08-14 NOTE — Telephone Encounter (Signed)
 Pt of Dr. Lavonne Prairie. This non Cardiac RX was prescribed at last OV. Please advise.

## 2023-08-23 DIAGNOSIS — Z89511 Acquired absence of right leg below knee: Secondary | ICD-10-CM | POA: Diagnosis not present

## 2023-08-23 DIAGNOSIS — I509 Heart failure, unspecified: Secondary | ICD-10-CM | POA: Diagnosis not present

## 2023-08-23 DIAGNOSIS — M549 Dorsalgia, unspecified: Secondary | ICD-10-CM | POA: Diagnosis not present

## 2023-09-08 IMAGING — CR DG CHEST 2V
2 series · 2 of 2 positions shown · non-contrast
Comparison: 02/20/2021

CLINICAL DATA: Right hip pain after a fall. Sweating, confusion,
syncope

EXAM:
CHEST - 2 VIEW

[chest lat]
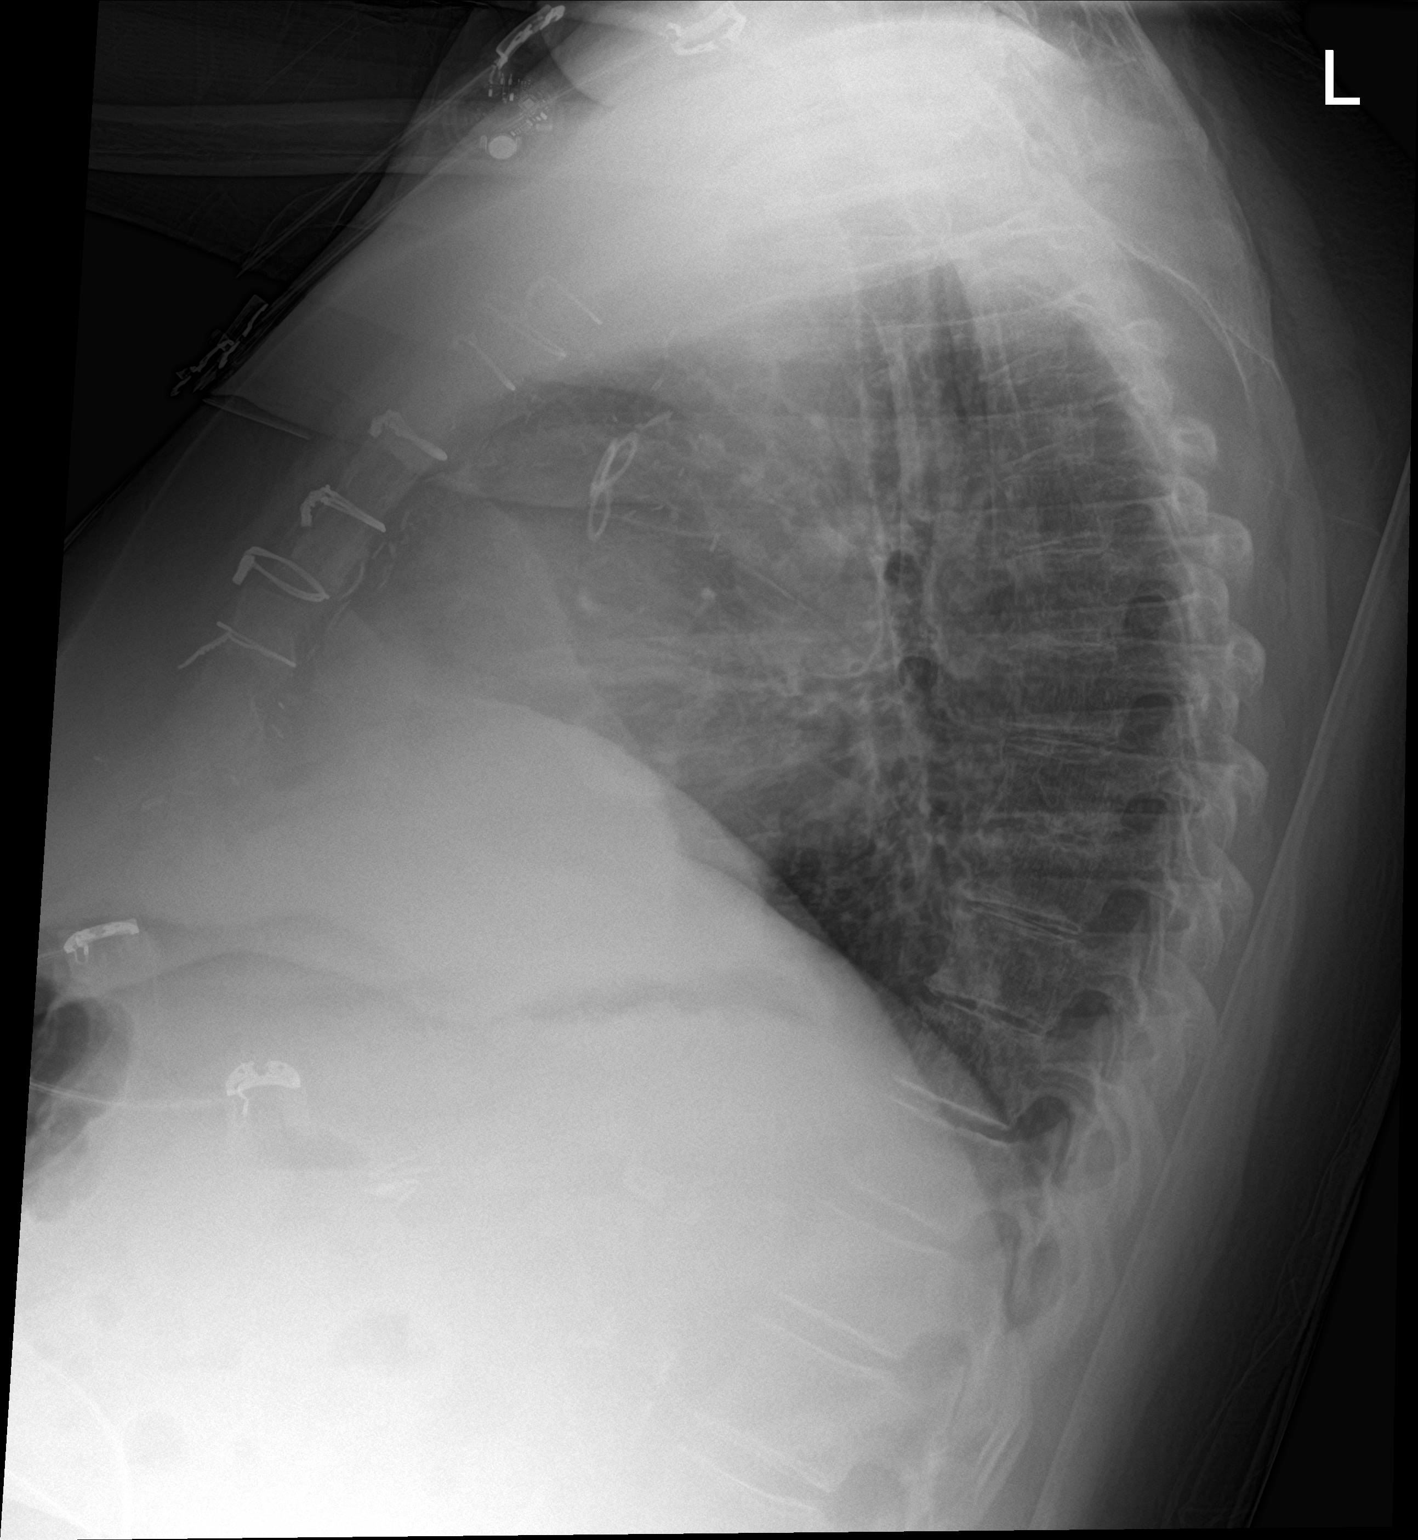

[chest ap]
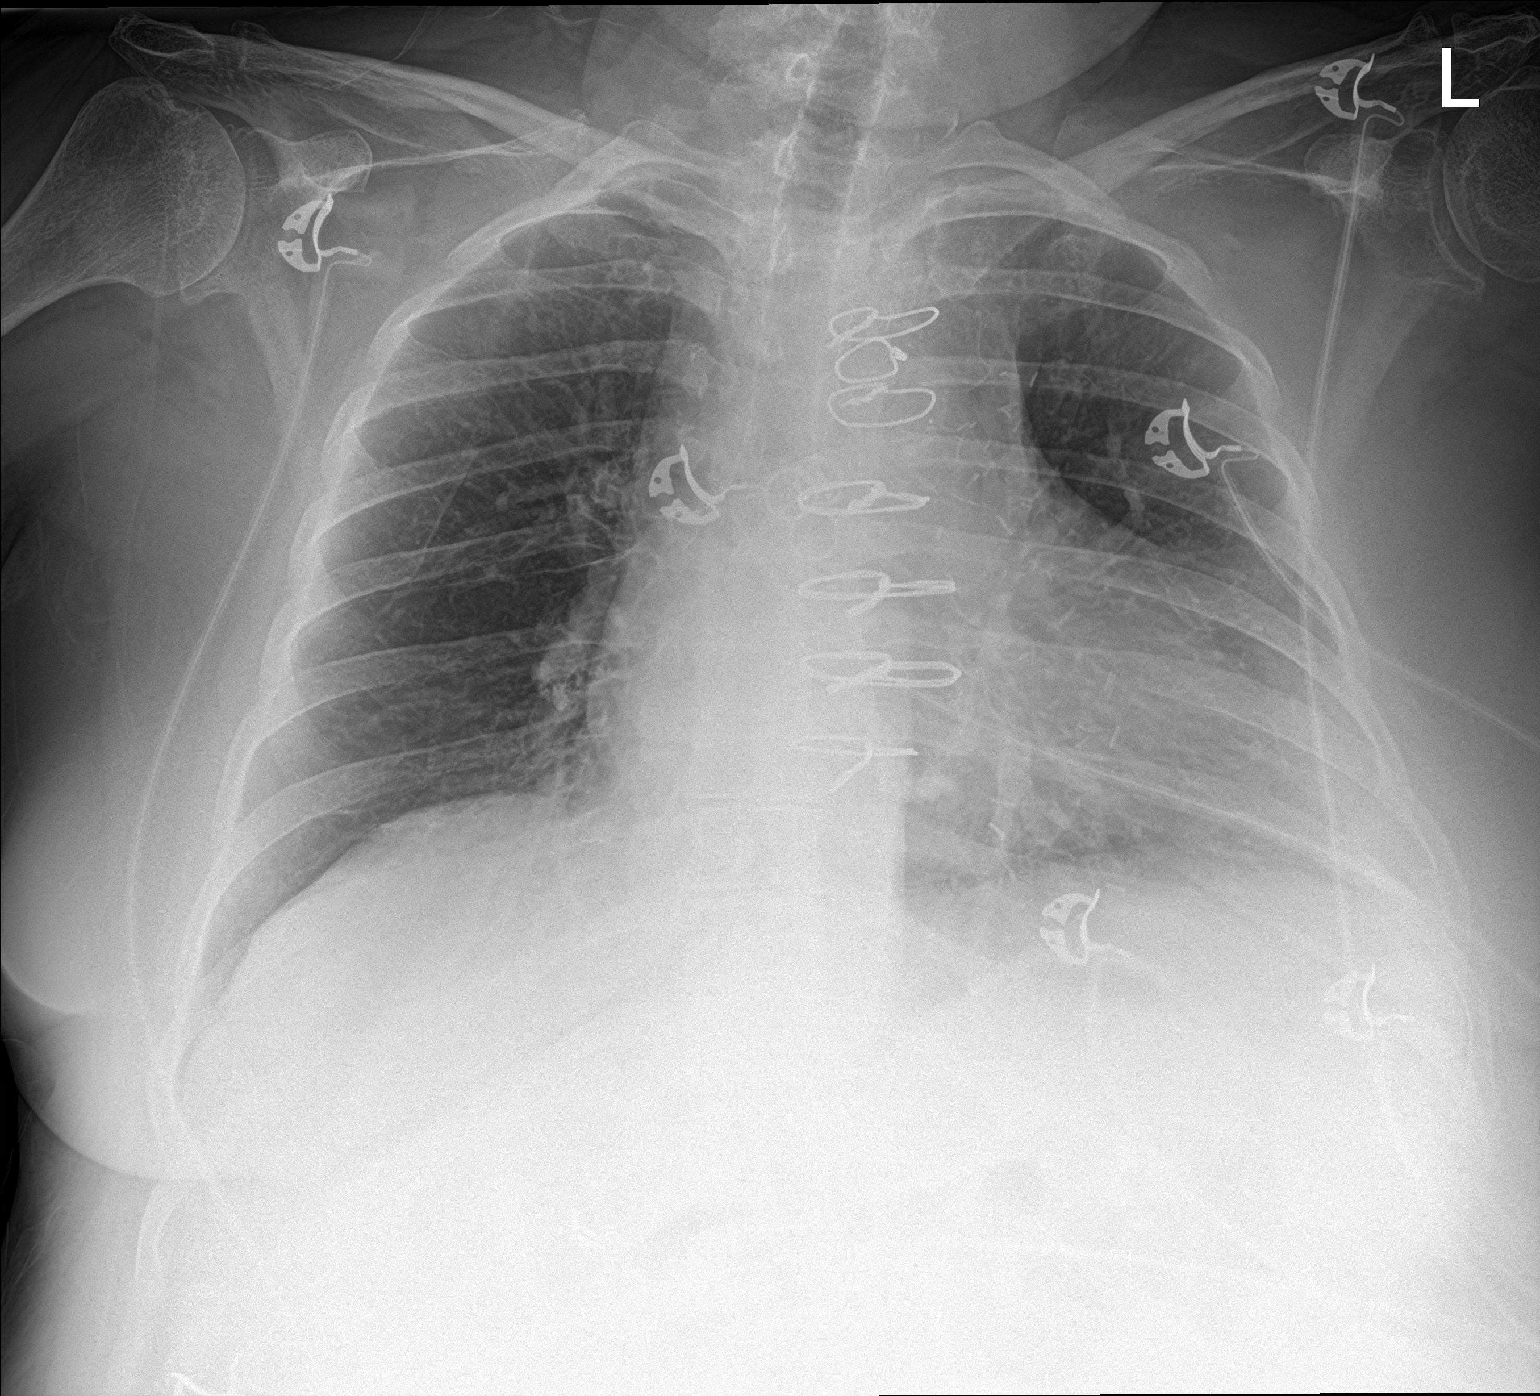

[2 of 2 positions shown; findings below may reference images not displayed]

FINDINGS: Postoperative changes in the mediastinum. Cardiac enlargement. Lungs
are clear. No pleural effusions. No pneumothorax. Mediastinal
contours appear intact.
IMPRESSION: No active cardiopulmonary disease.

## 2023-09-08 IMAGING — CT CT HEAD W/O CM
4 series · 17 of 47 positions shown, 19 images · non-contrast
Comparison: 02/19/2021

CLINICAL DATA: Syncope versus seizure.  Anticoagulated.



[Series 2: head wo · axial · 0.44mm/px · z∈[-114,+11]mm · 7 of 35 slices shown, 9 images]
[im 5/35  brain]
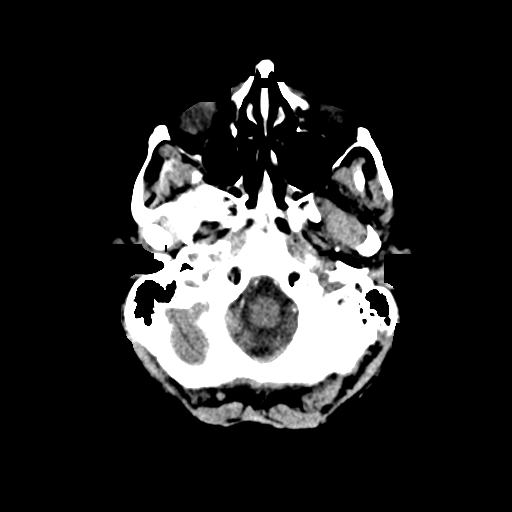
[im 5/35  bone]
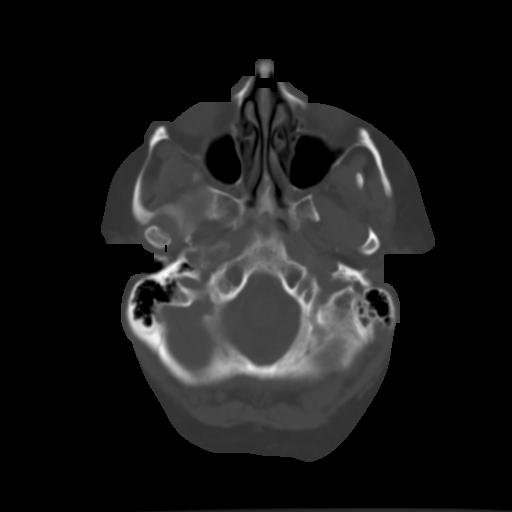
[im 9/35  brain]
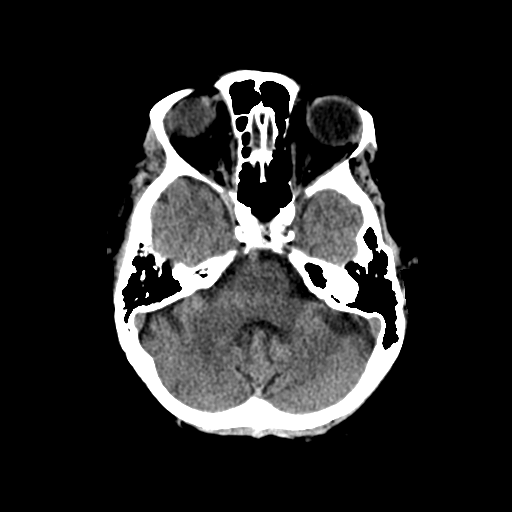
[im 13/35  brain]
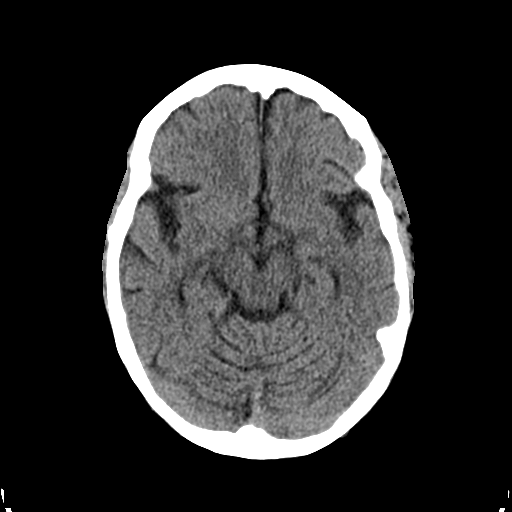
[im 18/35  brain]
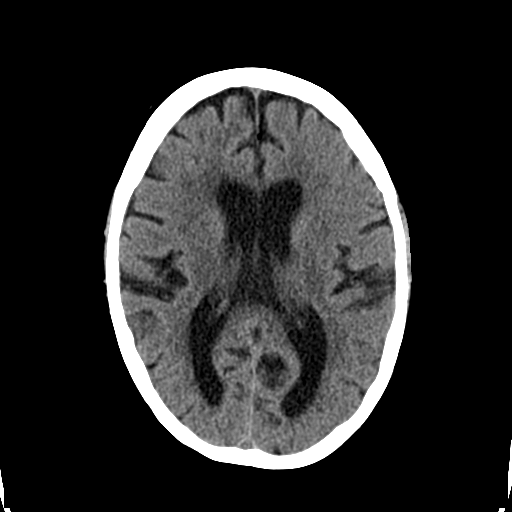
[im 22/35  brain]
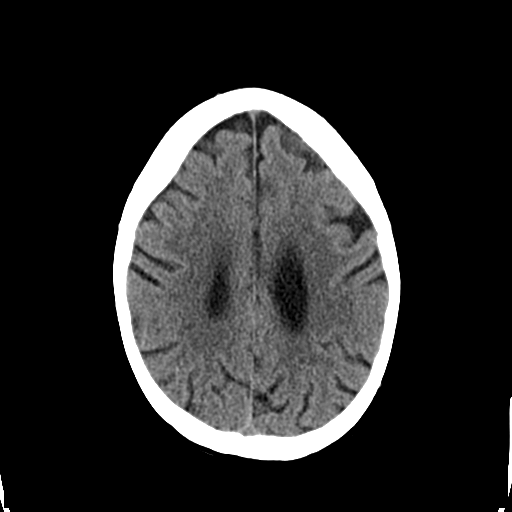
[im 22/35  bone]
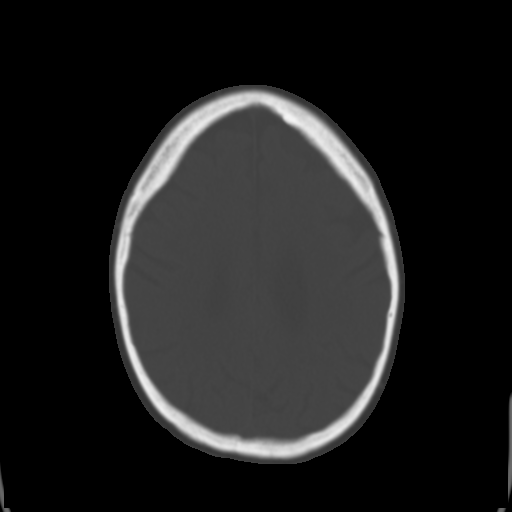
[im 26/35  brain]
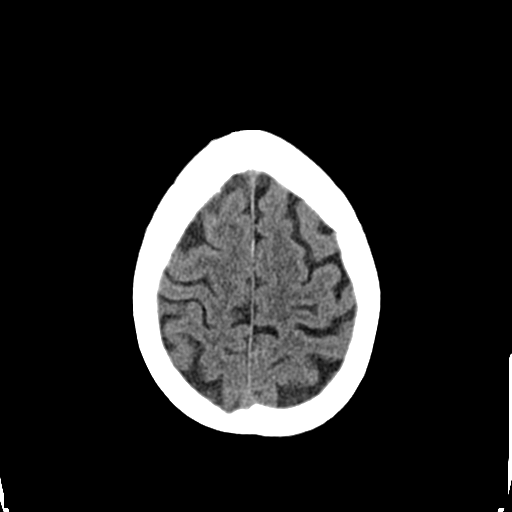
[im 30/35  brain]
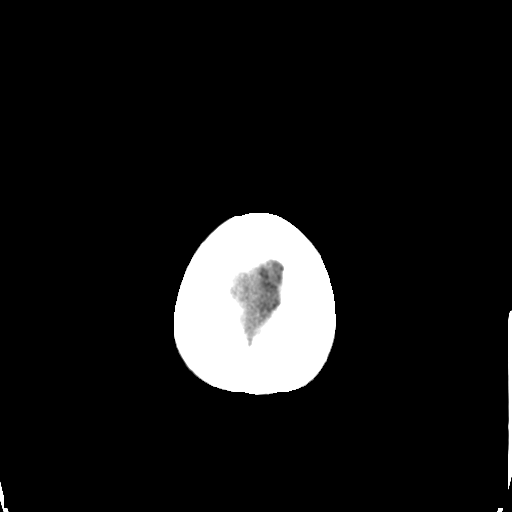

[Series 3: head bone · axial · 0.44mm/px · z∈[-118,-58]mm · 4 of 86 slices shown]
[im 9/86  bone]
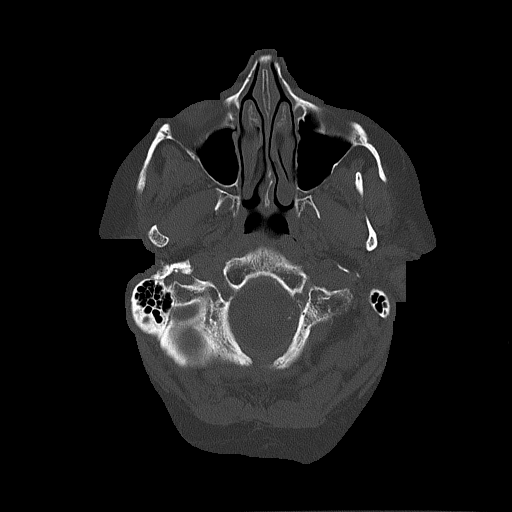
[im 18/86  bone]
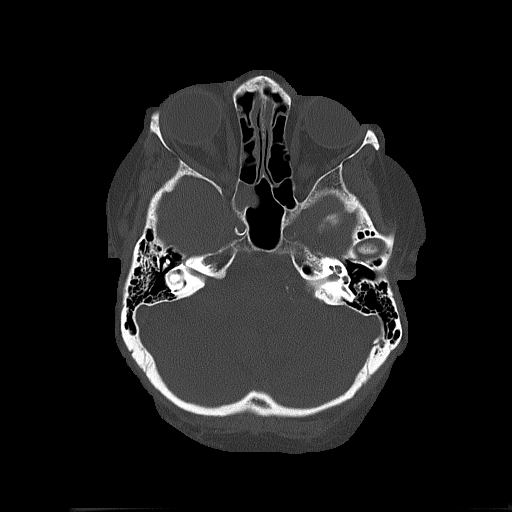
[im 26/86  bone]
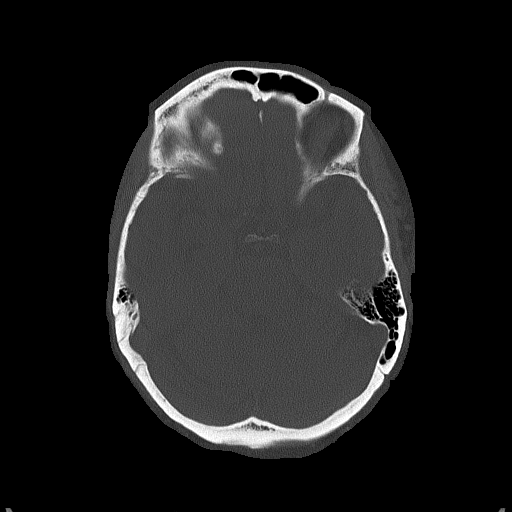
[im 39/86  bone]
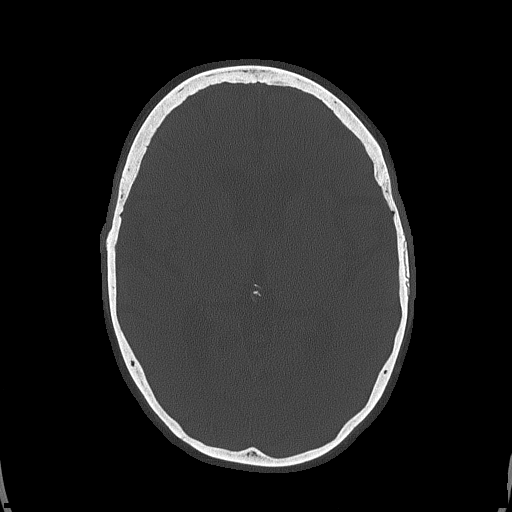

[Series 4: coronal soft tissue · coronal · 0.33mm/px · 3 of 66 slices shown]
[im 22/66  brain]
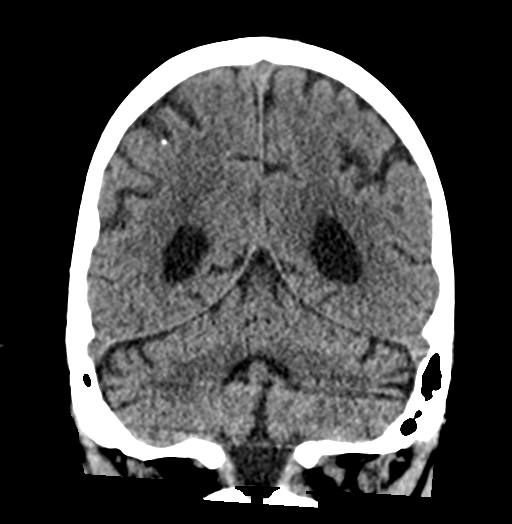
[im 29/66  brain]
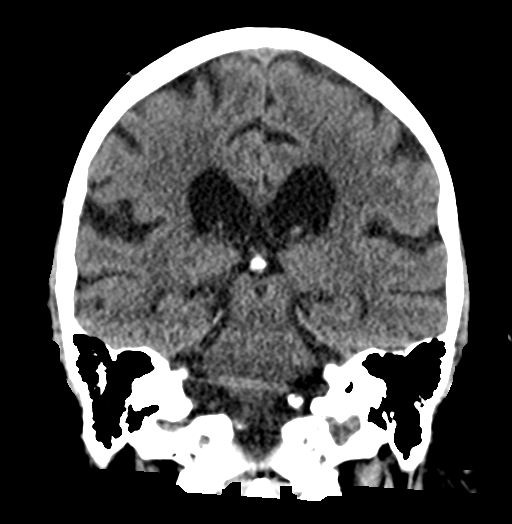
[im 37/66  brain]
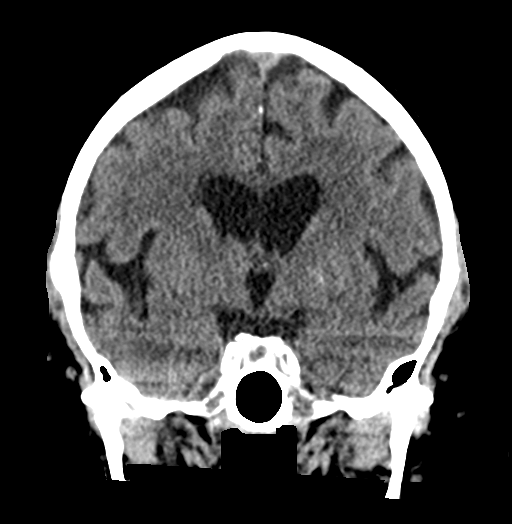

[Series 5: sagittal soft tissue · sagittal · 0.34mm/px · 3 of 55 slices shown]
[im 19/55  brain]
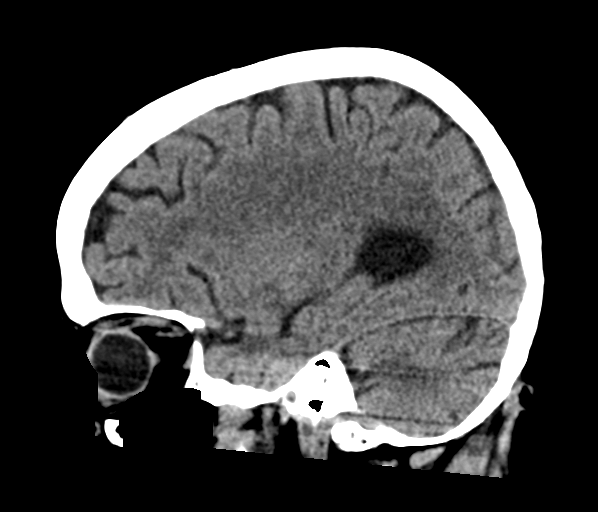
[im 28/55  brain]
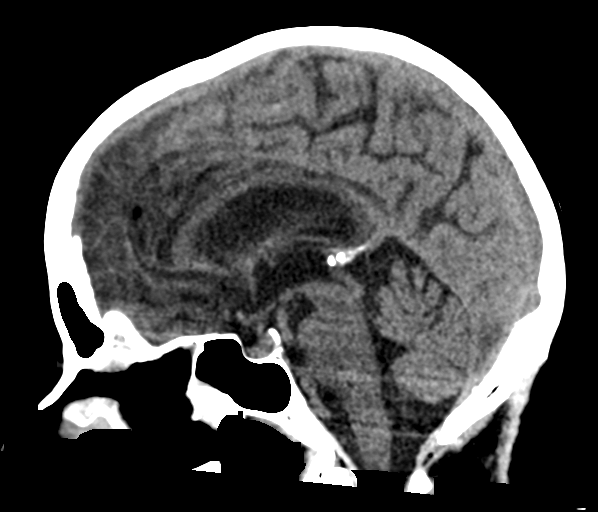
[im 37/55  brain]
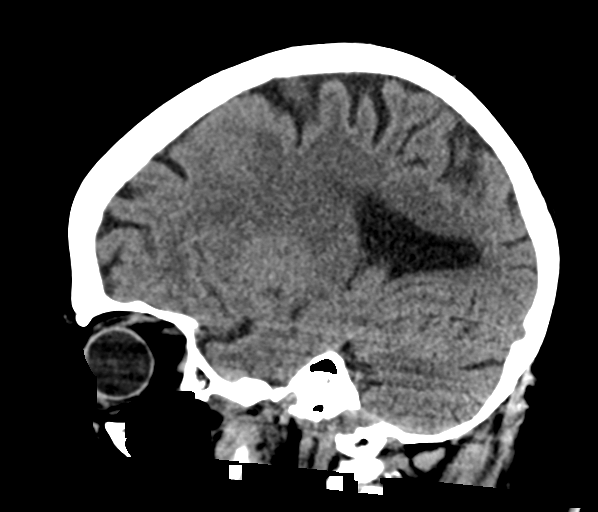

[17 of 47 positions shown; findings below may reference images not displayed]

FINDINGS: Brain: Generalized atrophy. Chronic small-vessel ischemic changes of
the cerebral hemispheric white matter. No sign of acute infarction,
mass lesion, hemorrhage, hydrocephalus or extra-axial collection.

Vascular: There is atherosclerotic calcification of the major
vessels at the base of the brain.

Skull: Negative

Sinuses/Orbits: Clear except for a retention cyst in the sphenoid
sinus. Orbits negative.

Other: None
IMPRESSION: No acute finding. Volume loss. Chronic small-vessel ischemic changes
of the white matter. Atherosclerotic calcification of the major
vessels at the base of the brain.

## 2023-09-17 ENCOUNTER — Other Ambulatory Visit: Payer: Self-pay | Admitting: Cardiology

## 2023-09-17 DIAGNOSIS — I25810 Atherosclerosis of coronary artery bypass graft(s) without angina pectoris: Secondary | ICD-10-CM

## 2023-09-17 DIAGNOSIS — I739 Peripheral vascular disease, unspecified: Secondary | ICD-10-CM

## 2023-09-17 DIAGNOSIS — I7025 Atherosclerosis of native arteries of other extremities with ulceration: Secondary | ICD-10-CM

## 2023-09-22 DIAGNOSIS — M549 Dorsalgia, unspecified: Secondary | ICD-10-CM | POA: Diagnosis not present

## 2023-09-22 DIAGNOSIS — Z89511 Acquired absence of right leg below knee: Secondary | ICD-10-CM | POA: Diagnosis not present

## 2023-09-22 DIAGNOSIS — I509 Heart failure, unspecified: Secondary | ICD-10-CM | POA: Diagnosis not present

## 2023-09-23 ENCOUNTER — Other Ambulatory Visit: Payer: Self-pay | Admitting: Physician Assistant

## 2023-10-02 ENCOUNTER — Other Ambulatory Visit: Payer: Self-pay | Admitting: Physician Assistant

## 2023-10-02 DIAGNOSIS — Z952 Presence of prosthetic heart valve: Secondary | ICD-10-CM

## 2023-10-10 ENCOUNTER — Other Ambulatory Visit: Payer: Self-pay | Admitting: Cardiology

## 2023-10-12 ENCOUNTER — Other Ambulatory Visit: Payer: Self-pay | Admitting: Cardiology

## 2023-10-13 ENCOUNTER — Other Ambulatory Visit: Payer: Self-pay | Admitting: Cardiology

## 2023-10-16 ENCOUNTER — Other Ambulatory Visit: Payer: Self-pay | Admitting: Physician Assistant

## 2023-10-16 DIAGNOSIS — Z952 Presence of prosthetic heart valve: Secondary | ICD-10-CM

## 2023-10-23 ENCOUNTER — Telehealth: Payer: Self-pay | Admitting: Cardiology

## 2023-10-23 ENCOUNTER — Other Ambulatory Visit (HOSPITAL_COMMUNITY): Payer: Self-pay

## 2023-10-23 DIAGNOSIS — Z952 Presence of prosthetic heart valve: Secondary | ICD-10-CM

## 2023-10-23 MED ORDER — AMOXICILLIN 500 MG PO CAPS
ORAL_CAPSULE | ORAL | 0 refills | Status: DC
  Filled 2023-10-23: qty 12, 3d supply, fill #0
  Filled 2023-11-29: qty 12, 1d supply, fill #0

## 2023-10-23 NOTE — Telephone Encounter (Signed)
 The patient's son Chance was notified that the amoxicillin  refill was sent to the requested pharmacy.

## 2023-10-23 NOTE — Telephone Encounter (Signed)
*  STAT* If patient is at the pharmacy, call can be transferred to refill team.   1. Which medications need to be refilled? (please list name of each medication and dose if known) amoxicillin  (AMOXIL ) 500 MG tablet    2. Would you like to learn more about the convenience, safety, & potential cost savings by using the [facility]? No   3. Are you open to using the [facility] (Type [facility]. ). No   4. Which pharmacy/location (including street and city if local pharmacy) is medication to be sent to? [id number] - [place], [place] - [address]     5. Do they need a 30 day or 90 day supply? For dental procedure

## 2023-10-29 ENCOUNTER — Other Ambulatory Visit: Payer: Self-pay | Admitting: Physician Assistant

## 2023-10-29 DIAGNOSIS — Z952 Presence of prosthetic heart valve: Secondary | ICD-10-CM

## 2023-11-03 ENCOUNTER — Other Ambulatory Visit (HOSPITAL_COMMUNITY): Payer: Self-pay

## 2023-11-09 ENCOUNTER — Other Ambulatory Visit: Payer: Self-pay | Admitting: Cardiology

## 2023-11-17 ENCOUNTER — Other Ambulatory Visit: Payer: Self-pay | Admitting: Cardiology

## 2023-11-17 DIAGNOSIS — Z952 Presence of prosthetic heart valve: Secondary | ICD-10-CM

## 2023-11-23 ENCOUNTER — Other Ambulatory Visit: Payer: Self-pay | Admitting: Cardiology

## 2023-11-23 ENCOUNTER — Other Ambulatory Visit: Payer: Self-pay | Admitting: Physician Assistant

## 2023-11-23 DIAGNOSIS — Z952 Presence of prosthetic heart valve: Secondary | ICD-10-CM

## 2023-11-25 NOTE — Telephone Encounter (Signed)
 Pt of Dr. Lavonne Prairie. This non Cardiac RX was prescribed at last OV. Please advise.

## 2023-11-26 ENCOUNTER — Telehealth: Payer: Self-pay | Admitting: Cardiology

## 2023-11-26 DIAGNOSIS — Z952 Presence of prosthetic heart valve: Secondary | ICD-10-CM

## 2023-11-26 NOTE — Telephone Encounter (Signed)
 *  STAT* If patient is at the pharmacy, call can be transferred to refill team.   1. Which medications need to be refilled? (please list name of each medication and dose if known)   amoxicillin  (AMOXIL ) 500 MG tablet   2. Which pharmacy/location (including street and city if local pharmacy) is medication to be sent to?  [id number] - [place], [place] - [address]    3. Do they need a 30 day or 90 day supply? Patient is asking for more than 12 tablets because she has an ongoing dental issue and keeps going back to the dentist.

## 2023-11-26 NOTE — Telephone Encounter (Signed)
 Pt of Dr. Lavona requesting more Amoxicillin  Please address.

## 2023-11-30 ENCOUNTER — Other Ambulatory Visit (HOSPITAL_COMMUNITY): Payer: Self-pay

## 2023-11-30 ENCOUNTER — Other Ambulatory Visit: Payer: Self-pay

## 2023-12-03 NOTE — Telephone Encounter (Signed)
 I would normally write the Rx for 4 tablets (2000mg ) 30-60 min prior to dental appointment. Quantity is usually just 4 and then can put refills on it.   If she is getting 12 at a time, that would cover 3 visits. She has filled it 9 times since April. I would double check that she is not taking all 12 at once and that she is only taking prior to apt

## 2023-12-04 MED ORDER — AMOXICILLIN 500 MG PO TABS: ORAL_TABLET | ORAL | 3 refills | Status: DC

## 2023-12-04 NOTE — Telephone Encounter (Signed)
 Spoke with pt regarding refills of amoxicillin . Pt stated she only takes 4 tablets, 60 minutes prior to dental work. Refill sent, 4 tablets with 3 refills. Pt verbalized understanding. All questions if any were answered.

## 2023-12-12 ENCOUNTER — Other Ambulatory Visit: Payer: Self-pay | Admitting: Cardiology

## 2023-12-16 ENCOUNTER — Encounter: Payer: Self-pay | Admitting: Neurology

## 2023-12-16 ENCOUNTER — Ambulatory Visit: Payer: Medicare Other | Admitting: Neurology

## 2023-12-16 VITALS — BP 144/65 | HR 70 | Ht 65.0 in | Wt 182.0 lb

## 2023-12-16 DIAGNOSIS — G546 Phantom limb syndrome with pain: Secondary | ICD-10-CM

## 2023-12-16 DIAGNOSIS — N1831 Chronic kidney disease, stage 3a: Secondary | ICD-10-CM | POA: Insufficient documentation

## 2023-12-16 DIAGNOSIS — E118 Type 2 diabetes mellitus with unspecified complications: Secondary | ICD-10-CM | POA: Insufficient documentation

## 2023-12-16 DIAGNOSIS — G40209 Localization-related (focal) (partial) symptomatic epilepsy and epileptic syndromes with complex partial seizures, not intractable, without status epilepticus: Secondary | ICD-10-CM

## 2023-12-16 MED ORDER — PREGABALIN 25 MG PO CAPS: ORAL_CAPSULE | ORAL | 4 refills | Status: AC

## 2023-12-16 MED ORDER — LEVETIRACETAM ER 750 MG PO TB24: ORAL_TABLET | ORAL | 4 refills | Status: AC

## 2023-12-16 NOTE — Progress Notes (Signed)
 NEUROLOGY FOLLOW UP OFFICE NOTE  [name] [id number] Apr 25, 1955  HISTORY OF PRESENT ILLNESS: I had the pleasure of seeing [name] in follow-up in the neurology clinic on 12/16/2023.  The patient was last seen on a year ago for seizure and phantom limb pain. She is again accompanied by her son Chance who helps supplement the history today.  Records and images were personally reviewed where available.  Since her last visit, she states I don't think I'm doing good some days. They deny any seizures since 2015 on Levetiracetam  ER 1500mg  at bedtime, no side effects. She was on Pregabalin  50mg  at bedtime which was helping with phantom limb pain, however reports stopping it 2 months ago. There were 2 mornings she was out of it, having a hard time waking up. She was also taking Robaxin  at that time. She reports some days she is not really with it. Chance notes that he always finds a correlation that these occur if she is hyperglycemic or when she has a UTI. She would be back to baseline when these are back to normal. He is back home living with them and fills her pillbox. She has occasional headaches. She has tenderness in her upper back between her shoulders.  [place] no acute changes. She reports rolling off her bed recently. She states I'm just falling apart.  HPI:  This is a 68 yo RH woman with a history of diabetes, hypertension, hyperlipidemia, peripheral vascular disease s/p right carotid endarterectomy, CAD s/p CABG, and right BKA secondary to diabetes. She was admitted for left foot osteomyelitis due to poor healing wound after a fall last winter. She underwent left transtibial amputation on 08/22/13. She was discharged home on 06/11, then started having altered mental status on 06/15. Her son reports she had hit her head in the left frontal region when getting into their car, then started acting drowsy and out of it the next day. That  evening, they were getting ready to eat when her right arm flexed and clenched into her chest followed by head turn to the left,stiffening and shaking for 1 minute as she fell to her left side. She was confused for 10 minutes, back to baseline within 30 minutes. She was brought to [facility] ER where BP was noted to be 205/87. She was given IV labetalol  with slow improvement in BP. Blood sugar noted to be 303. Head CT showed low attenuation in the left corona radiata and centrum semiovale. She had an MRI brain initially without contrast which showed increased T2 and FLAIR signal in the left hemisphere from the anterior frontal cortex, supplemental motor area, extending down to the posterior limb of the internal capsule. She became agitated and needed sedation for MRI brain with contrast, however again became restless with motion degradation, no abnormal enhancement seen. Left ICA flow void was noted to be absent, similar since 2009. It was felt that symptoms were not consistent with left MCA ischemia and concern was for viral CNS infection and received 4 days of empiric IV Acyclovir . She underwent a lumbar puncture which showed WBC 1, RBC 17, glucose 82, protein 29, VDRL, CMV, HSV, arbovirus, gram stain and culture negative. No malignant cells on cytology. She was started on Keppra  with no further seizures, discharged home on 06/18. She was apparently intermittently confused at home, woke up better then next day, then had another seizure that morning. Her son today reports she  had a generalized convulsion with head deviation to left for 5 minutes. On arrival to the ER, per records, she was poorly responsive with right gaze deviation, nystagmoid eye movements. BP 197/80 She was given IV Ativan , according to her son she remained in this state for an hour, then was unresponsive for 36 hours after. Prolonged EEG showed diffuse slowing, no electrographic seizures or epileptiform discharges. Repeat MRI brain with and without  contrast was again degraded by motion. Similar abnormal signal in the left frontal and parietal lobes again noted, predominantly in the centrum semiovale extending into the subcortical white matter. There is an area of questionable thickening in the left frontal cortex noted to be unchanged, no effacement of sulci in this area, no restricted diffusion, no abnormal enhancement although study is suboptimal. Chronically occluded left ICA again noted. She was discharged home on Keppra  XR 1500mg /day. Repeat MRI 2 months later showed complete resolution of abnormal signal in the left frontal and parietal regions. There was mild chronic small vessel disease bilaterally. Per report, findings could have been due to cerebritis or conceivably due to venous thrombosis without infarction.   Of note, in 2009, she had a nocturnal convulsion and was admitted to [facility]. Records unavailable for review, according to her son she was not started on seizure medication due to first seizure. She had an MRI brain at that time which I personally reviewed, it was noted to be limited by motion and patient refusing to complete study, however on FLAIR sequences, white matter changes in the left hemisphere are not present.   Epilepsy Risk Factors: Left frontal white matter lesion that has resolved. Otherwise she had a normal birth and early development. There is no history of febrile convulsions, significant traumatic brain injury, neurosurgical procedures, or family history of seizures.   Diagnostic Data: Neuropsychological testing done in June 2021 was largely within expected normative ranges. There were relative weaknesses across certain aspects of executive functioning, as well as some performance variability across several domains, but scores within normal limits. Etiology of cognitive changes possibly multifactorial, but overall no indication of a neurodegenerative illness   PAST MEDICAL HISTORY: Past Medical History:  Diagnosis  Date   Anemia, normocytic normochromic 03/14/2008   Arthritis    hands   CAD (coronary artery disease) of artery bypass graft 03/14/2008   Chronic kidney disease (CKD) 12/26/2015   stage 3 due to type 2 diabetes mellitus   Chronic pain syndrome 09/25/2015   Coronary artery disease involving native coronary artery without angina pectoris 09/25/2015   Essential hypertension 03/14/2008   Gastroesophageal reflux disease with esophagitis 07/09/2016   Generalized anxiety disorder with panic attacks    History of one panic attack   GERD (gastroesophageal reflux disease)    takes Zantac    Hyperlipidemia, unspecified 03/14/2008   Localization-related symptomatic epilepsy and epileptic syndromes with complex partial seizures, not intractable, without status epilepticus 06/27/2014   Major depressive disorder 03/14/2008   Neuropathy    Osteomyelitis of ankle or foot, left, acute 06/17/2013   Peripheral vascular disease    PVD (peripheral vascular disease) 03/25/2012   S/P TAVR (transcatheter aortic valve replacement) 12/31/2021   s/p TAVR with a 23 mm Edwards S3UR via the TF approach by Drs Wonda & Bartle   Type 2 diabetes mellitus with diabetic mononeuropathy, with long-term current use of insulin  03/14/2008    MEDICATIONS: Current Outpatient Medications on File Prior to Visit  Medication Sig Dispense Refill   acetaminophen -codeine  (TYLENOL  #3) 300-30 MG tablet Take  1 tablet by mouth every 6 (six) hours as needed for severe pain.     albuterol  (VENTOLIN  HFA) 108 (90 Base) MCG/ACT inhaler Inhale 1-2 puffs into the lungs every 6 (six) hours as needed for wheezing or shortness of breath.      ALPRAZolam  (XANAX ) 0.5 MG tablet Take 0.5 mg by mouth 2 (two) times daily as needed for anxiety.     amoxicillin  (AMOXIL ) 500 MG capsule Take 4 capsules by mouth 1 hour prior to dental appointment. 12 capsule 0   aspirin  81 MG tablet Take 1 tablet (81 mg total) by mouth daily. 90 tablet 0   carvedilol   (COREG ) 6.25 MG tablet TAKE 1 TABLET(6.25 MG) BY MOUTH TWICE DAILY 180 tablet 0   clopidogrel  (PLAVIX ) 75 MG tablet TAKE 1 TABLET(75 MG) BY MOUTH DAILY 90 tablet 0   diclofenac Sodium (VOLTAREN) 1 % GEL Apply 1 application. topically 4 (four) times daily as needed (pain).     ezetimibe  (ZETIA ) 10 MG tablet TAKE 1 TABLET(10 MG) BY MOUTH DAILY 90 tablet 0   fluconazole  (DIFLUCAN ) 150 MG tablet TAKE 1 TABLET BY MOUTH EVERY DAY. USE AS DIRECTED FOR YEAST INFECTIONS 5 tablet 3   HUMALOG KWIKPEN 100 UNIT/ML KwikPen Inject 12 Units into the skin 2 (two) times daily with a meal.     LANTUS  SOLOSTAR 100 UNIT/ML Solostar Pen Inject 38 Units into the skin at bedtime.     levETIRAcetam  (KEPPRA  XR) 750 MG 24 hr tablet TAKE 2 TABLETS (1,500 MG TOTAL) BY MOUTH AT BEDTIME. 180 tablet 1   lidocaine  (LIDODERM ) 5 % Place 1 patch onto the skin daily.     losartan  (COZAAR ) 50 MG tablet TAKE 1 TABLET(50 MG) BY MOUTH DAILY 90 tablet 0   Multiple Vitamins-Minerals (MULTIVITAMINS THER. W/MINERALS) TABS Take 1 tablet by mouth daily.       mupirocin  ointment (BACTROBAN ) 2 % Apply 1 Application topically 3 (three) times daily.     rosuvastatin  (CRESTOR ) 40 MG tablet TAKE 1 TABLET(40 MG) BY MOUTH DAILY 90 tablet 1   sertraline  (ZOLOFT ) 100 MG tablet Take 1 tablet (100 mg total) by mouth 2 (two) times daily. 180 tablet 0   torsemide  (DEMADEX ) 20 MG tablet TAKE 1 TABLET BY MOUTH ONCE DAILY. TAKE EXTRA TABLET AS NEEDED 90 tablet 2   amoxicillin  (AMOXIL ) 500 MG tablet TAKE 4 TABLETS BY MOUTH 1 HOUR PRIOR TO DENTAL WORJ INCLUDING CLEANINGS (Patient not taking: Reported on 12/16/2023) 4 tablet 3   methocarbamol  (ROBAXIN ) 500 MG tablet Take 1 tablet (500 mg total) by mouth every 8 (eight) hours as needed for up to 40 doses for muscle spasms. (Patient not taking: Reported on 12/16/2023) 40 tablet 0   nitroGLYCERIN  (NITROSTAT ) 0.4 MG SL tablet DISSOLVE 1 TABLET UNDER THE TONGUE EVERY 5 MINUTES AS DIRECTED/AS NEEDED FOR CHEST PAIN (Patient  not taking: Reported on 12/16/2023) 75 tablet 3   pregabalin  (LYRICA ) 50 MG capsule TAKE 1 CAPSULE BY MOUTH EVERY NIGHT (Patient not taking: Reported on 12/16/2023) 90 capsule 3   No current facility-administered medications on file prior to visit.    ALLERGIES: Allergies  Allergen Reactions   Metformin  Hcl Nausea And Vomiting   Tape Rash    Redness, rash, itchiness    Ace Inhibitors Cough   Actos [Pioglitazone] Swelling   Atorvastatin  Other (See Comments)    myalgias   Candesartan Cilexetil Other (See Comments)    Rash cant recall    Gabapentin  Other (See Comments)    Dizziness and  unbalanced   Influenza Vaccines     nearly killed me   Isosorbide  Nitrate Other (See Comments)    HA.   Keflex [Cephalexin]      stomach upset   Quinapril  Hcl Cough   Tramadol Hcl Other (See Comments)    seziure    FAMILY HISTORY: Family History  Problem Relation Age of Onset   Cancer Mother    Heart disease Father    Diabetes Father    Hyperlipidemia Father    Hypertension Father    Alzheimer's disease Maternal Grandfather     SOCIAL HISTORY: Social History   Socioeconomic History   Marital status: Married    Spouse name: Not on file   Number of children: 2   Years of education: 14   Highest education level: Associate degree: occupational, Scientist, product/process development, or vocational program  Occupational History   Occupation: Disability  Tobacco Use   Smoking status: Never   Smokeless tobacco: Never  Vaping Use   Vaping status: Never Used  Substance and Sexual Activity   Alcohol use: No   Drug use: No   Sexual activity: Not Currently  Other Topics Concern   Not on file  Social History Narrative   Right handed    Lives in a one story home   Drinks caffeine    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   Social Drivers of Health   Financial Resource Strain: Unknown (11/18/2020)   Received from [facility]   Overall Financial Resource Strain (CARDIA)    Difficulty of Paying Living  Expenses: Patient declined  Food Insecurity: No Food Insecurity (12/31/2021)   Hunger Vital Sign    Worried About Running Out of Food in the Last Year: Never true    Ran Out of Food in the Last Year: Never true  Transportation Needs: No Transportation Needs (12/31/2021)   PRAPARE - Administrator, Civil Service (Medical): No    Lack of Transportation (Non-Medical): No  Physical Activity: Not on file  Stress: Not on file  Social Connections: Not on file  Intimate Partner Violence: Not At Risk (12/31/2021)   Humiliation, Afraid, Rape, and Kick questionnaire    Fear of Current or Ex-Partner: No    Emotionally Abused: No    Physically Abused: No    Sexually Abused: No     PHYSICAL EXAM: Vitals:   12/16/23 1441  BP: (!) 144/65  Pulse: 70  SpO2: 98%   General: No acute distress, sitting on wheelchair, s/p bilateral BKA with prosthesis Head:  Normocephalic/atraumatic Skin/Extremities: No rash, no edema Neurological Exam: alert and awake. No aphasia or dysarthria. Fund of knowledge is appropriate.  Attention and concentration are normal.   Cranial nerves: Pupils equal, round. Extraocular movements intact with no nystagmus. Visual fields full.  No facial asymmetry.  Motor: Bulk and tone normal, muscle strength 5/5 on both UE, bilateral hip flexion. Finger to nose testing intact.  Gait not tested.   IMPRESSION: This is a 68 yo RH woman with a history of diabetes, hypertension, hyperlipidemia, peripheral vascular disease s/p right carotid endarterectomy, CAD s/p CABG, s/p bilateral BKA, nocturnal convulsion in 2009, and likely tension type headaches. She remains seizure-free since 2015 on Levetiracetam  ER 1500mg  (750mg : 2 tabs) at bedtime. Pregabalin  was helping with phantom limb pain however she reports side effects on 50mg  dose. We discussed trying lower dose 25mg  at bedtime and monitoring if this helps with pain without side effects. Follow-up in 1 year, call for  any changes.    Thank you for allowing me to participate in her care.  Please do not hesitate to call for any questions or concerns.    Darice Shivers, M.D.   CC: Dr. Okey

## 2023-12-16 NOTE — Progress Notes (Unsigned)
 Cardiology Office Note:   Date:  12/17/2023  ID:  [name], DOB November 19, 1955, MRN [id number] PCP: Okey Carlin Redbird, MD  [facility] Providers Cardiologist:  Lynwood Schilling, MD {  History of Present Illness:   [name] is a 68 y.o. female  who presents for evaluation of CAD and systolic HF.  She has had a history of CABG, bilateral lower extremity amputations chronic kidney disease.  She was admitted to [facility] .   She had acute on chronic heart failure.  Her discharge weight was 177 pounds.  She does have chronic renal insufficiency with her last creatinine being 1.74.  An echocardiogram in September at [facility] demonstrated an EF of 30 to 35%.  There was thinning and akinesis of the mid anterior, mid anteroseptal, basal anterior and basal anteroseptal segments.  There was moderately thickened aortic valve with severe aortic stenosis.  There was mild mitral regurgitation.  I was going to consider cardiac cath but her creat was increased when I saw her in the office for the first visit.  The creat was 2.83.  I reduced her Demadex .  Echo was ordered.  Preliminary reading still demonstrates that the ejection fraction is reduced with severe aortic stenosis.  She was admitted to [facility] in Dec with respiratory failure.  She was COVID positive.  She had elevated trop with demand ischemia.    EF was again 35 - 40%.  She was back in the hospital in Feb with syncope.  She had hypotension.  She also had anemia.  Colonoscopy did not demonstrate active bleed.  She had AKI.    She had a TEE. This demonstrated severe aortic stenosis.  There was moderate MR.  EF was 40 - 45%.  The tricuspid valve was heavily calcified.  There was moderate mitral regurgitation secondary to a restricted posterior leaflet.  There is mild mitral valve stenosis.  Aortic stenosis was severe.  This is expected to be low-flow low gradient.  Was heavily calcified.  There was plaque in the aorta noted.  She is now status post TAVR.    Follow-up echo done last week demonstrated normal functioning TAVR.  Her ejection fraction is up to 50 to 55%.  She still has a heavily calcified mitral valve with moderate stenosis and mild regurgitation.   Since I last saw her she has had no new cardiovascular complaints.  She actually went to [place] with her sons.  She is walking around on her prostheses.  She has less breathlessness than she had previously.  She is not having any chest pressure, neck or arm discomfort.  She is not having any new shortness of breath, PND or orthopnea.  She is not having any weight gain or edema.  ROS: As stated in the HPI and negative for all other systems.  Studies Reviewed:    EKG:   EKG Interpretation Date/Time:  Thursday December 17 2023 12:23:40 EDT Ventricular Rate:  67 PR Interval:  168 QRS Duration:  154 QT Interval:  468 QTC Calculation: 494 R Axis:   -12  Text Interpretation: Normal sinus rhythm Left bundle branch block When compared with ECG of 31-Dec-2021 09:43, T wave inversion less evident in Lateral leads Confirmed by Schilling Lynwood ([id number]) on 12/17/2023 12:34:20 PM    Risk Assessment/Calculations:              Physical Exam:   VS:  BP 131/69 (Patient Position: Sitting)   Pulse 73   Ht 5' 5 (1.651 m)  Wt 182 lb (82.6 kg)   SpO2 95%   BMI 30.29 kg/m    Wt Readings from Last 3 Encounters:  12/17/23 182 lb (82.6 kg)  12/16/23 182 lb (82.6 kg)  01/14/23 170 lb (77.1 kg)     GEN: Well nourished, well developed in no acute distress NECK: No JVD; No carotid bruits CARDIAC: RRR, 2/6 apical systolic murmur, no diastolic murmurs, rubs, gallops RESPIRATORY:  Clear to auscultation without rales, wheezing or rhonchi  ABDOMEN: Soft, non-tender, non-distended EXTREMITIES:  No edema; No deformity   ASSESSMENT AND PLAN:   CHRONIC SYSTOLIC HF:    She seems to be euvolemic.      Her EF was improved at the last visit.  No change in therapy.  Her last EF was 55%.     TAVR: She status  post TAVR and this was follow up in October 2024.  She understands endocarditis prophylaxis.  HTN:   Her blood pressure is at target.  She will continue the meds as listed.   CAD/CABG:   She will continue DAPT given her occluded grafts and significant native vessel disease.  MITRAL REGURGITATION:   This was moderate in the past and I will follow this up in 6 months with repeat echo.   CKD:    Creat most recently was previously 1.71.  However, I do not have the most recent and she says this was checked by her primary provider.  I cannot get access to this.  I talked to her son about it and they will make sure she has a recent creatinine and follow this with her primary provider.   DM: A1c is 9.2.  No change in therapy.     Follow up with and APP in 6 months.  Signed, Lynwood Schilling, MD

## 2023-12-16 NOTE — Patient Instructions (Signed)
 It's always a pleasure to see you!  I sent in a prescription for the lower dose Pregabalin  25mg : take 1 capsule every night.   2. Continue Keppra  XR 750mg : take 2 tablets every night  3. Follow-up in 1 year, call for any changes   Seizure Precautions: 1. If medication has been prescribed for you to prevent seizures, take it exactly as directed.  Do not stop taking the medicine without talking to your doctor first, even if you have not had a seizure in a long time.   2. Avoid activities in which a seizure would cause danger to yourself or to others.  Don't operate dangerous machinery, swim alone, or climb in high or dangerous places, such as on ladders, roofs, or girders.  Do not drive unless your doctor says you may.  3. If you have any warning that you may have a seizure, lay down in a safe place where you can't hurt yourself.    4.  No driving for 6 months from last seizure, as per [place]  state law.   Please refer to the following link on the Epilepsy Foundation of America's website for more information: http://www.epilepsyfoundation.org/answerplace/Social/driving/drivingu.cfm   5.  Maintain good sleep hygiene. Avoid alcohol  6.  Contact your doctor if you have any problems that may be related to the medicine you are taking.  7.  Call 911 and bring the patient back to the ED if:        A.  The seizure lasts longer than 5 minutes.       B.  The patient doesn't awaken shortly after the seizure  C.  The patient has new problems such as difficulty seeing, speaking or moving  D.  The patient was injured during the seizure  E.  The patient has a temperature over 102 F (39C)  F.  The patient vomited and now is having trouble breathing

## 2023-12-17 ENCOUNTER — Encounter: Payer: Self-pay | Admitting: Cardiology

## 2023-12-17 ENCOUNTER — Ambulatory Visit: Attending: Cardiology | Admitting: Cardiology

## 2023-12-17 VITALS — BP 131/69 | HR 73 | Ht 65.0 in | Wt 182.0 lb

## 2023-12-17 DIAGNOSIS — Z952 Presence of prosthetic heart valve: Secondary | ICD-10-CM

## 2023-12-17 DIAGNOSIS — E118 Type 2 diabetes mellitus with unspecified complications: Secondary | ICD-10-CM | POA: Diagnosis not present

## 2023-12-17 DIAGNOSIS — I5022 Chronic systolic (congestive) heart failure: Secondary | ICD-10-CM

## 2023-12-17 DIAGNOSIS — N1831 Chronic kidney disease, stage 3a: Secondary | ICD-10-CM | POA: Diagnosis not present

## 2023-12-17 DIAGNOSIS — I25810 Atherosclerosis of coronary artery bypass graft(s) without angina pectoris: Secondary | ICD-10-CM | POA: Diagnosis not present

## 2023-12-17 NOTE — Patient Instructions (Addendum)
 Medication Instructions:  Your physician recommends that you continue on your current medications as directed. Please refer to the Current Medication list given to you today.  *If you need a refill on your cardiac medications before your next appointment, please call your pharmacy*  Lab Work: NONE If you have labs (blood work) drawn today and your tests are completely normal, you will receive your results only by: MyChart Message (if you have MyChart) OR A paper copy in the mail If you have any lab test that is abnormal or we need to change your treatment, we will call you to review the results.  Testing/Procedures: Echocardiogram in 6 months Your physician has requested that you have an echocardiogram. Echocardiography is a painless test that uses sound waves to create images of your heart. It provides your doctor with information about the size and shape of your heart and how well your heart's chambers and valves are working. This procedure takes approximately one hour. There are no restrictions for this procedure. Please do NOT wear cologne, perfume, aftershave, or lotions (deodorant is allowed). Please arrive 15 minutes prior to your appointment time.  Please note: We ask at that you not bring children with you during ultrasound (echo/ vascular) testing. Due to room size and safety concerns, children are not allowed in the ultrasound rooms during exams. Our front office staff cannot provide observation of children in our lobby area while testing is being conducted. An adult accompanying a patient to their appointment will only be allowed in the ultrasound room at the discretion of the ultrasound technician under special circumstances. We apologize for any inconvenience.   Follow-Up: At [facility], you and your health needs are our priority.  As part of our continuing mission to provide you with exceptional heart care, our providers are all part of one team.  This team includes  your primary Cardiologist (physician) and Advanced Practice Providers or APPs (Physician Assistants and Nurse Practitioners) who all work together to provide you with the care you need, when you need it.  Your next appointment:   6 month(s)  Provider:   One of our Advanced Practice Providers (APPs): Morse Clause, PA-C  Lamarr Satterfield, NP Miriam Shams, NP  Olivia Pavy, PA-C Josefa Beauvais, NP  Leontine Salen, PA-C Orren Fabry, PA-C  Hao Meng, PA-C Ernest Dick, NP  Damien Braver, NP Jon Hails, PA-C  Waddell Donath, PA-C    Dayna Dunn, PA-C  Scott Weaver, PA-C Lum Louis, NP Katlyn West, NP Callie Goodrich, PA-C  Xika Zhao, NP Sheng Haley, PA-C    Kathleen Johnson, PA-C    We recommend signing up for the patient portal called MyChart.  Sign up information is provided on this After Visit Summary.  MyChart is used to connect with patients for Virtual Visits (Telemedicine).  Patients are able to view lab/test results, encounter notes, upcoming appointments, etc.  Non-urgent messages can be sent to your provider as well.   To learn more about what you can do with MyChart, go to [email or web address].

## 2023-12-19 ENCOUNTER — Other Ambulatory Visit: Payer: Self-pay | Admitting: Cardiology

## 2023-12-19 DIAGNOSIS — I739 Peripheral vascular disease, unspecified: Secondary | ICD-10-CM

## 2023-12-19 DIAGNOSIS — I7025 Atherosclerosis of native arteries of other extremities with ulceration: Secondary | ICD-10-CM

## 2023-12-19 DIAGNOSIS — I25810 Atherosclerosis of coronary artery bypass graft(s) without angina pectoris: Secondary | ICD-10-CM

## 2023-12-30 ENCOUNTER — Other Ambulatory Visit: Payer: Self-pay | Admitting: Cardiology

## 2024-01-22 ENCOUNTER — Other Ambulatory Visit: Payer: Self-pay | Admitting: Physician Assistant

## 2024-01-22 DIAGNOSIS — Z952 Presence of prosthetic heart valve: Secondary | ICD-10-CM

## 2024-01-25 NOTE — Telephone Encounter (Signed)
 Pt of Dr. Lavona. Does Dr. Lavona want to refill this RX? Please advise

## 2024-01-26 MED ORDER — AMOXICILLIN 500 MG PO CAPS: ORAL_CAPSULE | ORAL | 2 refills | Status: DC

## 2024-01-26 NOTE — Telephone Encounter (Signed)
 Refills authorized for SBE prophylaxis. Pt has had TAVR.

## 2024-01-28 ENCOUNTER — Emergency Department (HOSPITAL_BASED_OUTPATIENT_CLINIC_OR_DEPARTMENT_OTHER)
Admission: EM | Admit: 2024-01-28 | Discharge: 2024-01-28 | Discharge status: Against Medical Advice | Attending: Emergency Medicine | Admitting: Emergency Medicine

## 2024-01-28 ENCOUNTER — Encounter (HOSPITAL_BASED_OUTPATIENT_CLINIC_OR_DEPARTMENT_OTHER): Payer: Self-pay

## 2024-01-28 ENCOUNTER — Other Ambulatory Visit: Payer: Self-pay

## 2024-01-28 ENCOUNTER — Emergency Department (HOSPITAL_BASED_OUTPATIENT_CLINIC_OR_DEPARTMENT_OTHER)

## 2024-01-28 DIAGNOSIS — Z5321 Procedure and treatment not carried out due to patient leaving prior to being seen by health care provider: Secondary | ICD-10-CM | POA: Insufficient documentation

## 2024-01-28 DIAGNOSIS — M25511 Pain in right shoulder: Secondary | ICD-10-CM | POA: Insufficient documentation

## 2024-01-28 DIAGNOSIS — Z7902 Long term (current) use of antithrombotics/antiplatelets: Secondary | ICD-10-CM | POA: Insufficient documentation

## 2024-01-28 DIAGNOSIS — W01198A Fall on same level from slipping, tripping and stumbling with subsequent striking against other object, initial encounter: Secondary | ICD-10-CM | POA: Insufficient documentation

## 2024-01-28 NOTE — ED Triage Notes (Signed)
 Pt states that she fell and hit her right upper  back and shoulder  yesterday. States that she is on Plavix . States that she did not hit her head.

## 2024-01-28 NOTE — ED Notes (Signed)
 Pt left with family after her name wasn't called.

## 2024-02-06 ENCOUNTER — Other Ambulatory Visit: Payer: Self-pay | Admitting: Cardiology

## 2024-02-08 ENCOUNTER — Encounter: Payer: Self-pay | Admitting: Neurology

## 2024-03-08 ENCOUNTER — Encounter: Payer: Self-pay | Admitting: Cardiology

## 2024-03-08 DIAGNOSIS — Z952 Presence of prosthetic heart valve: Secondary | ICD-10-CM

## 2024-03-08 MED ORDER — FLUCONAZOLE 150 MG PO TABS: ORAL_TABLET | ORAL | 0 refills | Status: AC

## 2024-03-08 MED ORDER — AMOXICILLIN 500 MG PO CAPS: ORAL_CAPSULE | ORAL | 2 refills | Status: AC

## 2024-03-08 NOTE — Addendum Note (Signed)
 Addended by: JOSHUA ANDREZ PARAS on: 03/08/2024 04:56 PM   Modules accepted: Orders

## 2024-03-24 ENCOUNTER — Telehealth: Payer: Self-pay | Admitting: Cardiology

## 2024-03-24 NOTE — Telephone Encounter (Signed)
" ° °  Pt c/o of Chest Pain: STAT if active CP, including tightness, pressure, jaw pain, radiating pain to shoulder/upper arm/back, CP unrelieved by Nitro. Symptoms reported of SOB, nausea, vomiting, sweating.  1. Are you having CP right now? Very slight chest pain/ when she gets upset  , this just started a week ago/  Not having them currently    2. Are you experiencing any other symptoms (ex. SOB, nausea, vomiting, sweating)? SOB only when she is having the pains / No right now   3. Is your CP continuous or coming and going? Coming and going    4. Have you taken Nitroglycerin ? Took it 2 days ago and it helped a lot    5. How long have you been experiencing CP? 1week     6. If NO CP at time of call then end call with telling Pt to call back or call 911 if Chest pain returns prior to return call from triage team.  "

## 2024-03-24 NOTE — Telephone Encounter (Signed)
 Spoke with pt who stated she has been having chest pain on and off over the last week. Pt states she is short of breath during these episodes but no other time. Pt states nitroglycerin  does relieve the chest pain. Pt states that she has been dealing with stress since around Christmas. Explained that Dr. Denver schedule is booked but stated we could schedule her for a DOD appt on Monday 1/12 with Dr. Santo. Pt agreed to appt. ED precautions given to pt in the mean time if CP doesn't relieve with nitroglycerin , SOB worsens, etc. Pt verbalized understanding of plan and had no further questions at this time.

## 2024-03-28 ENCOUNTER — Ambulatory Visit: Attending: Internal Medicine | Admitting: Internal Medicine

## 2024-03-28 NOTE — Progress Notes (Unsigned)
 " Cardiology Office Note:   Date:  03/28/2024  ID:  [name], DOB [birth year], MRN [id number] PCP: Okey Carlin Redbird, MD  [facility] Providers Cardiologist:  Lynwood Schilling, MD {  History of Present Illness:   [name] is a 69 y.o. female  who presents for evaluation of CAD and systolic HF.  She has had a history of CABG, bilateral lower extremity amputations chronic kidney disease.  She was admitted to [facility] .   She had acute on chronic heart failure.  Her discharge weight was 177 pounds.  She does have chronic renal insufficiency with her last creatinine being 1.74.  An echocardiogram in September at [facility] demonstrated an EF of 30 to 35%.  There was thinning and akinesis of the mid anterior, mid anteroseptal, basal anterior and basal anteroseptal segments.  There was moderately thickened aortic valve with severe aortic stenosis.  There was mild mitral regurgitation.  I was going to consider cardiac cath but her creat was increased when I saw her in the office for the first visit.  The creat was 2.83.  I reduced her Demadex .  Echo was ordered.  Preliminary reading still demonstrates that the ejection fraction is reduced with severe aortic stenosis.  She was admitted to [facility] in Dec with respiratory failure.  She was COVID positive.  She had elevated trop with demand ischemia.    EF was again 35 - 40%.  She was back in the hospital in Feb with syncope.  She had hypotension.  She also had anemia.  Colonoscopy did not demonstrate active bleed.  She had AKI.    She had a TEE. This demonstrated severe aortic stenosis.  There was moderate MR.  EF was 40 - 45%.  The tricuspid valve was heavily calcified.  There was moderate mitral regurgitation secondary to a restricted posterior leaflet.  There is mild mitral valve stenosis.  Aortic stenosis was severe.  This is expected to be low-flow low gradient.  Was heavily calcified.  There was plaque in the aorta noted.  She is now status post TAVR.    Follow-up echo done last week demonstrated normal functioning TAVR.  Her ejection fraction is up to 50 to 55%.  She still has a heavily calcified mitral valve with moderate stenosis and mild regurgitation.   poke with pt who stated she has been having chest pain on and off over the last week. Pt states she is short of breath during these episodes but no other time. Pt states nitroglycerin  does relieve the chest pain. Pt states that she has been dealing with stress since around Christmas. Explained that Dr. Denver schedule is booked but stated we could schedule her for a DOD appt on Monday 1/12 with Dr. Santo. Pt agreed to appt. ED precautions given to pt in the mean time if CP doesn't relieve with nitroglycerin , SOB worsens, etc. Pt verbalized understanding of plan and had no further questions at this time.   ROS: As stated in the HPI and negative for all other systems.  Studies Reviewed:    EKG:        Risk Assessment/Calculations:     No BP recorded.  {Refresh Note OR Click here to enter BP  :1}***        Physical Exam:   VS:  There were no vitals taken for this visit.   Wt Readings from Last 3 Encounters:  01/28/24 180 lb (81.6 kg)  12/17/23 182 lb (82.6 kg)  12/16/23 182  lb (82.6 kg)     GEN: Well nourished, well developed in no acute distress NECK: No JVD; No carotid bruits CARDIAC: RRR, 2/6 apical systolic murmur, no diastolic murmurs, rubs, gallops RESPIRATORY:  Clear to auscultation without rales, wheezing or rhonchi  ABDOMEN: Soft, non-tender, non-distended EXTREMITIES:  No edema; No deformity   ASSESSMENT AND PLAN:   CHRONIC SYSTOLIC HF:    She seems to be euvolemic.      Her EF was improved at the last visit.  No change in therapy.  Her last EF was 55%.     TAVR: She status post TAVR and this was follow up in October 2024.  She understands endocarditis prophylaxis.  HTN:   Her blood pressure is at target.  She will continue the meds as listed.   CAD/CABG:    She will continue DAPT given her occluded grafts and significant native vessel disease.  MITRAL REGURGITATION:   This was moderate in the past and I will follow this up in 6 months with repeat echo.   CKD:    Creat most recently was previously 1.71.  However, I do not have the most recent and she says this was checked by her primary provider.  I cannot get access to this.  I talked to her son about it and they will make sure she has a recent creatinine and follow this with her primary provider.   DM: A1c is 9.2.  No change in therapy.     Follow up with and APP in 6 months.  Signed, Stanly DELENA Leavens, MD   "

## 2024-03-29 NOTE — Telephone Encounter (Signed)
Pt seen in clinic 11/2

## 2024-03-31 ENCOUNTER — Encounter: Payer: Self-pay | Admitting: Internal Medicine

## 2024-04-01 ENCOUNTER — Telehealth: Payer: Self-pay | Admitting: Cardiology

## 2024-04-01 NOTE — Telephone Encounter (Signed)
 Pt c/o of Chest Pain: STAT if active (IN THIS MOMENT) CP, including tightness, pressure, jaw pain, shoulder/upper arm/back pain, SOB, nausea, and vomiting.  1. Are you having CP right now (tightness, pressure, or discomfort)? Discomfort   2. Are you experiencing any other symptoms (ex. SOB, nausea, vomiting, sweating)? Sob, nausea  3. How long have you been experiencing CP? 2 weeks   4. Is your CP continuous or coming and going? Coming and going   5. Have you taken Nitroglycerin ? Yes, but havent taking any today.  6. If CP returns before callback, please consider calling 911. ?

## 2024-04-01 NOTE — Telephone Encounter (Signed)
"   Spoke to patient.  .She states she has been having chest discomfort for 2 weeks or more. No other sympltoms    Rn question patient- patient  discomfort is off and on but have not had any discomfort last week.  She stated it occurred last week but she anxious and upset with her family. She states she took 4 tablet of NTG at during this episode but has not taken any NTG since.  Can occur every other day she also states.  She states she was give an appointment earlier but she cancelled it because she thought the provider would not speak english well enough for to understand.  Patient  seemed brisk with the conversation. RN  had conversation with patient. Instruction given on how to take NTG.  Also she wanted to about traveling and her sons.  Conversation as about 15 minutes. Appointment was made for next week-04/07/24 . Patient verbalized understanding how to take medication and aware if she develop worsening symptoms she is to call EMS got ER    "

## 2024-04-06 NOTE — Progress Notes (Unsigned)
 " Cardiology Office Note:   Date:  04/07/2024  ID:  [name], DOB 03-08-1956, MRN [id number] PCP: Okey Carlin Redbird, MD   [facility] Prov[facility] Cardiologist:  Lynwood Schilling, MD {  History of Present Illness:   [name] is a 69 y.o. female who presents for evaluation of CAD and systolic HF.  She has had a history of CABG, bilateral lower extremity amputations chronic kidney disease.  She was admitted to [facility] .   She had acute on chronic heart failure.  Her discharge weight was 177 pounds.  She does have chronic renal insufficiency with her last creatinine being 1.74.  An echocardiogram in September at [facility] demonstrated an EF of 30 to 35%.  There was thinning and akinesis of the mid anterior, mid anteroseptal, basal anterior and basal anteroseptal segments.  There was moderately thickened aortic valve with severe aortic stenosis.  There was mild mitral regurgitation.  I was going to consider cardiac cath but her creat was increased when I saw her in the office for the first visit.  The creat was 2.83.  I reduced her Demadex .  Echo was ordered.  Preliminary reading still demonstrates that the ejection fraction is reduced with severe aortic stenosis.  She was admitted to [facility] in Dec with respiratory failure.  She was COVID positive.  She had elevated trop with demand ischemia.    EF was again 35 - 40%.  She was back in the hospital in Feb with syncope.  She had hypotension.  She also had anemia.  Colonoscopy did not demonstrate active bleed.  She had AKI.    She had a TEE. This demonstrated severe aortic stenosis.  There was moderate MR.  EF was 40 - 45%.  The tricuspid valve was heavily calcified.  There was moderate mitral regurgitation secondary to a restricted posterior leaflet.  There is mild mitral valve stenosis.  Aortic stenosis was severe.  This is expected to be low-flow low gradient.  Was heavily calcified.  There was plaque in the aorta noted.  She is now status post TAVR.    Follow-up echo done last week demonstrated normal functioning TAVR.  Her ejection fraction is up to 50 to 55%.  She still has a heavily calcified mitral valve with moderate stenosis and mild regurgitation.   Since I last saw her she has been getting some chest discomfort.  She says she is getting a lot of emotional stress from her family.  She says that when this happens she gets a tingling sensation in her chest.  She gets a tired feeling.  She has taken nitroglycerin .  She actually had to take 3 in a row 1 time.  Typically 1 nitroglycerin  helps with this.  She does not seem to get chest discomfort when she is putting on her bilateral prosthesis and walking around the house.  She is not describing new shortness of breath, PND or orthopnea.  She is not describing new neck or arm discomfort.  She has had no weight gain or edema.  ROS: As stated in the HPI and negative for all other systems.  Studies Reviewed:    EKG:   EKG Interpretation Date/Time:  Thursday April 07 2024 15:29:46 EST Ventricular Rate:  69 PR Interval:  172 QRS Duration:  158 QT Interval:  454 QTC Calculation: 486 R Axis:   24  Text Interpretation: Normal sinus rhythm Left bundle branch block When compared with ECG of 17-Dec-2023 12:23, No significant change since last tracing  Confirmed by Lavona Rattan ([id number]) on 04/07/2024 5:05:42 PM   Risk Assessment/Calculations:              Physical Exam:   VS:  BP 126/74   Pulse 69   Ht 5' 5 (1.651 m)   Wt 182 lb 3.2 oz (82.6 kg)   SpO2 97%   BMI 30.32 kg/m    Wt Readings from Last 3 Encounters:  04/07/24 182 lb 3.2 oz (82.6 kg)  01/28/24 180 lb (81.6 kg)  12/17/23 182 lb (82.6 kg)     GEN: Well nourished, well developed in no acute distress NECK: No JVD; No carotid bruits CARDIAC: RRR, 2 out of 6 apical systolic murmur radiating slightly at the aortic outflow tract, no diastolic murmurs, rubs, gallops RESPIRATORY:  Clear to auscultation without rales, wheezing or  rhonchi  ABDOMEN: Soft, non-tender, non-distended EXTREMITIES:  No edema; No deformity   ASSESSMENT AND PLAN:   CHRONIC SYSTOLIC HF:    She seems to be euvolemic.  Her last ejection fraction was 55%.  No change in therapy.   TAVR: She status post TAVR and this was followed up in October 2024.  She understands endocarditis prophylaxis.  No further imaging is indicated.   HTN:   Her blood pressure is at target.  No change in therapy.   CAD/CABG:   She will continue she does have some chest discomfort that sounds nonanginal greater than anginal but does happen with emotional stress.  She has known severe coronary disease as per her cath from a couple of years ago.  I am going to begin medical management.  She has had headaches with Imdur  in the past but she is tolerating sublingual nitroglycerin  so I am going to try 60 mg of Imdur  and she will let me know if she has continued chest discomfort.    MITRAL REGURGITATION:   This was moderate before.  I will consider repeat echocardiography when I see her back in 6 months.  She would be very high risk for any intervention.   CKD:    Creat most recently was creatinine was 1.82.  This is followed by her primary provider.  This is not significantly increased from her baseline.   DM: A1c is 9.2.  I have encouraged follow-up with her primary provider.      Follow up with me or an APP in about 3 to 4 months.  Signed, Lynwood Lavona, MD   "

## 2024-04-07 ENCOUNTER — Encounter: Payer: Self-pay | Admitting: Cardiology

## 2024-04-07 ENCOUNTER — Ambulatory Visit: Admitting: Cardiology

## 2024-04-07 VITALS — BP 126/74 | HR 69 | Ht 65.0 in | Wt 182.2 lb

## 2024-04-07 DIAGNOSIS — N1832 Chronic kidney disease, stage 3b: Secondary | ICD-10-CM | POA: Diagnosis not present

## 2024-04-07 DIAGNOSIS — R0789 Other chest pain: Secondary | ICD-10-CM | POA: Diagnosis not present

## 2024-04-07 DIAGNOSIS — I2510 Atherosclerotic heart disease of native coronary artery without angina pectoris: Secondary | ICD-10-CM

## 2024-04-07 DIAGNOSIS — I5022 Chronic systolic (congestive) heart failure: Secondary | ICD-10-CM | POA: Diagnosis not present

## 2024-04-07 DIAGNOSIS — I340 Nonrheumatic mitral (valve) insufficiency: Secondary | ICD-10-CM

## 2024-04-07 DIAGNOSIS — E118 Type 2 diabetes mellitus with unspecified complications: Secondary | ICD-10-CM | POA: Diagnosis not present

## 2024-04-07 MED ORDER — ISOSORBIDE MONONITRATE ER 60 MG PO TB24: 60.0000 mg | ORAL_TABLET | Freq: Every day | ORAL | 3 refills | Status: AC

## 2024-04-07 NOTE — Patient Instructions (Signed)
 Medication Instructions:  Start Imdur  60 mg once daily *If you need a refill on your cardiac medications before your next appointment, please call your pharmacy*  Lab Work: NONE If you have labs (blood work) drawn today and your tests are completely normal, you will receive your results only by: MyChart Message (if you have MyChart) OR A paper copy in the mail If you have any lab test that is abnormal or we need to change your treatment, we will call you to review the results.  Testing/Procedures: NONE  Follow-Up: At [facility], you and your health needs are our priority.  As part of our continuing mission to provide you with exceptional heart care, our providers are all part of one team.  This team includes your primary Cardiologist (physician) and Advanced Practice Providers or APPs (Physician Assistants and Nurse Practitioners) who all work together to provide you with the care you need, when you need it.  Your next appointment:   6 month(s)  Provider:   Lynwood Schilling, MD    We recommend signing up for the patient portal called MyChart.  Sign up information is provided on this After Visit Summary.  MyChart is used to connect with patients for Virtual Visits (Telemedicine).  Patients are able to view lab/test results, encounter notes, upcoming appointments, etc.  Non-urgent messages can be sent to your provider as well.   To learn more about what you can do with MyChart, go to [email or web address].

## 2024-06-28 ENCOUNTER — Other Ambulatory Visit (HOSPITAL_COMMUNITY)

## 2024-12-19 ENCOUNTER — Ambulatory Visit: Admitting: Neurology
